# Patient Record
Sex: Male | Born: 1950 | Race: White | Hispanic: No | Marital: Married | Smoking: Former smoker
Health system: Southern US, Community
[De-identification: ages and names within clinical notes are randomized; demographics above are authoritative.]

## PROBLEM LIST (undated history)

## (undated) DIAGNOSIS — Z87442 Personal history of urinary calculi: Secondary | ICD-10-CM

## (undated) DIAGNOSIS — I499 Cardiac arrhythmia, unspecified: Secondary | ICD-10-CM

## (undated) DIAGNOSIS — I255 Ischemic cardiomyopathy: Secondary | ICD-10-CM

## (undated) DIAGNOSIS — F329 Major depressive disorder, single episode, unspecified: Secondary | ICD-10-CM

## (undated) DIAGNOSIS — E785 Hyperlipidemia, unspecified: Secondary | ICD-10-CM

## (undated) DIAGNOSIS — E119 Type 2 diabetes mellitus without complications: Secondary | ICD-10-CM

## (undated) DIAGNOSIS — M5020 Other cervical disc displacement, unspecified cervical region: Secondary | ICD-10-CM

## (undated) DIAGNOSIS — M1990 Unspecified osteoarthritis, unspecified site: Secondary | ICD-10-CM

## (undated) DIAGNOSIS — I2510 Atherosclerotic heart disease of native coronary artery without angina pectoris: Secondary | ICD-10-CM

## (undated) DIAGNOSIS — G2581 Restless legs syndrome: Secondary | ICD-10-CM

## (undated) DIAGNOSIS — I700 Atherosclerosis of aorta: Secondary | ICD-10-CM

## (undated) DIAGNOSIS — I4891 Unspecified atrial fibrillation: Secondary | ICD-10-CM

## (undated) DIAGNOSIS — I214 Non-ST elevation (NSTEMI) myocardial infarction: Secondary | ICD-10-CM

## (undated) DIAGNOSIS — M5126 Other intervertebral disc displacement, lumbar region: Secondary | ICD-10-CM

## (undated) DIAGNOSIS — K7469 Other cirrhosis of liver: Secondary | ICD-10-CM

## (undated) DIAGNOSIS — I313 Pericardial effusion (noninflammatory): Secondary | ICD-10-CM

## (undated) DIAGNOSIS — G8929 Other chronic pain: Secondary | ICD-10-CM

## (undated) DIAGNOSIS — M47817 Spondylosis without myelopathy or radiculopathy, lumbosacral region: Secondary | ICD-10-CM

## (undated) DIAGNOSIS — M47812 Spondylosis without myelopathy or radiculopathy, cervical region: Secondary | ICD-10-CM

## (undated) DIAGNOSIS — R55 Syncope and collapse: Secondary | ICD-10-CM

## (undated) DIAGNOSIS — I3139 Other pericardial effusion (noninflammatory): Secondary | ICD-10-CM

## (undated) DIAGNOSIS — K7581 Nonalcoholic steatohepatitis (NASH): Secondary | ICD-10-CM

## (undated) DIAGNOSIS — G709 Myoneural disorder, unspecified: Secondary | ICD-10-CM

## (undated) DIAGNOSIS — K7460 Unspecified cirrhosis of liver: Secondary | ICD-10-CM

## (undated) DIAGNOSIS — I209 Angina pectoris, unspecified: Secondary | ICD-10-CM

## (undated) DIAGNOSIS — J189 Pneumonia, unspecified organism: Secondary | ICD-10-CM

## (undated) DIAGNOSIS — F32A Depression, unspecified: Secondary | ICD-10-CM

## (undated) DIAGNOSIS — M549 Dorsalgia, unspecified: Secondary | ICD-10-CM

## (undated) HISTORY — PX: CERVICAL FUSION: SHX112

## (undated) HISTORY — DX: Restless legs syndrome: G25.81

## (undated) HISTORY — PX: CARDIAC CATHETERIZATION: SHX172

## (undated) HISTORY — DX: Other intervertebral disc displacement, lumbar region: M51.26

## (undated) HISTORY — DX: Other cirrhosis of liver: K74.69

## (undated) HISTORY — DX: Atherosclerosis of aorta: I70.0

## (undated) HISTORY — PX: CORONARY ANGIOPLASTY: SHX604

## (undated) HISTORY — DX: Nonalcoholic steatohepatitis (NASH): K75.81

## (undated) HISTORY — DX: Other pericardial effusion (noninflammatory): I31.39

## (undated) HISTORY — DX: Hyperlipidemia, unspecified: E78.5

## (undated) HISTORY — DX: Unspecified cirrhosis of liver: K74.60

## (undated) HISTORY — PX: BACK SURGERY: SHX140

## (undated) HISTORY — DX: Ischemic cardiomyopathy: I25.5

## (undated) HISTORY — DX: Unspecified cirrhosis of liver: K75.81

## (undated) HISTORY — PX: APPENDECTOMY: SHX54

## (undated) HISTORY — DX: Pericardial effusion (noninflammatory): I31.3

---
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
## 1998-06-19 HISTORY — PX: ATRIAL FIBRILLATION ABLATION: SHX5732

## 1998-11-29 ENCOUNTER — Emergency Department (HOSPITAL_COMMUNITY): Admission: EM | Admit: 1998-11-29 | Discharge: 1998-11-29 | Payer: Self-pay | Admitting: Emergency Medicine

## 1999-06-20 HISTORY — PX: OTHER SURGICAL HISTORY: SHX169

## 2004-09-21 ENCOUNTER — Inpatient Hospital Stay (HOSPITAL_COMMUNITY): Admission: EM | Admit: 2004-09-21 | Discharge: 2004-09-22 | Payer: Self-pay | Admitting: Emergency Medicine

## 2007-03-28 ENCOUNTER — Ambulatory Visit: Payer: Self-pay | Admitting: Internal Medicine

## 2007-04-01 ENCOUNTER — Ambulatory Visit: Payer: Self-pay | Admitting: Internal Medicine

## 2007-06-20 HISTORY — PX: POSTERIOR CERVICAL FUSION/FORAMINOTOMY: SHX5038

## 2008-01-23 ENCOUNTER — Encounter
Admission: RE | Admit: 2008-01-23 | Discharge: 2008-01-23 | Payer: Self-pay | Admitting: Physical Medicine and Rehabilitation

## 2009-02-15 ENCOUNTER — Encounter: Admission: RE | Admit: 2009-02-15 | Discharge: 2009-02-15 | Payer: Self-pay | Admitting: *Deleted

## 2010-04-12 ENCOUNTER — Ambulatory Visit: Payer: Self-pay | Admitting: Internal Medicine

## 2010-07-07 ENCOUNTER — Ambulatory Visit: Payer: Self-pay | Admitting: Internal Medicine

## 2010-07-20 ENCOUNTER — Ambulatory Visit: Payer: Self-pay | Admitting: Internal Medicine

## 2010-08-18 ENCOUNTER — Ambulatory Visit: Payer: Self-pay | Admitting: Internal Medicine

## 2010-11-04 NOTE — Discharge Summary (Signed)
NAME:  TWAN, HARKIN NO.:  [id number]   MEDICAL RECORD NO.:  [id number]          PATIENT TYPE:  INP   LOCATION:  [place]                         FACILITY:  [facility]   PHYSICIAN:  Quay Burow, M.D.   DATE OF BIRTH:  [birth year]   DATE OF ADMISSION:  09/20/2004  DATE OF DISCHARGE:  09/22/2004                                 DISCHARGE SUMMARY   Mr. Hockett is a 60 year old white male patient, who came to the [place]  secondary to chest pain.  He was seen by Dr. Ezzie Dural, who was on call for  our service.  He states that it had started about 8 hours previous to his  coming to the ER.  It was described as burning and a band across the upper  anterior chest, associated with some burning in his left neck.  He had some  mild worsening if he bent over.  It was unrelated to meals, activity, or  respiration.  He had no associated dyspnea, diaphoresis, or nausea.  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  He does have a history of atrial fibrillation and  has undergone 2 ablation procedures down there.  He has also undergone  cardiac catheterization in the past and with no coronary artery disease.  He  was seen by Dr. Gaetano Net and admitted and put on IV heparin to rule out for  an MI.  He was seen by Dr. Quay Burow, also on September 21, 2004, who  ordered a chest CT to rule out any pulmonary pathologies and a pericardial  effusion.  He also underwent a 2 D echocardiogram on September 22, 2004.  His CT  scan did show a small to moderate pericardial effusion.  He also had  moderate cervical spondylosis with potential for spinal stenosis in C5 to  C6.  His echocardiogram was pending at the time of this discharge.  He was  seen by Dr. Terance Ice on September 22, 2004, considered stable for  discharge home.  He had been put on ibuprofen, and his pain was controlled.  His blood pressure 115/72.  His respirations were 18.  His  pulse was 90.  His temperature was 97.4.   LABORATORY DATA:  September 22, 2004, hemoglobin was 11.6, hematocrit 32.9, WBC  9.5, platelets __________.  Sodium was 138, potassium was 4.0, chloride was  104, CO2 was 26, glucose was 132 on admission.  BUN was 11, creatinine 0.8.  CK-MBs and troponins were negative x 2, and a marker was negative x 1.  Total cholesterol was 190, triglycerides were 229.  His HDL was 36.  His LDL  was 108.  I do not believe they were fasting levels.  CT of the chest showed  small amount of pericardial effusion with bibasilar pulmonary atelectasis.  CT of the neck done for a questionable soft mass, there was no evidence of  left neck mass.  Moderate cervical spondylosis with potential for spinal  stenosis C5 through C6.  His chest x-ray showed no cardiopulmonary process.  DISCHARGE DIAGNOSES:  1.  Chest pain.  CK-MBs negative.  Felt not to be coronary ischemia.  2.  Probable pericarditis.  3.  Pericardial effusion.  4.  Prior history of atrial fibrillation with ablations.  5.  Borderline diabetes mellitus.  6.  Dyslipidemia.   DISCHARGE MEDICATIONS:  1.  Nexium 40 mg 1 tab daily.  2.  Ibuprofen 600 mg q.8h. daily.  3.  Aspirin 81 mg once daily.  4.  Toprol XL 25 mg once daily.  This was given to him for his elevated      heart rate.   He should do no strenuous activity, no concentrated sweets, and decrease  simple carbohydrates.  He will follow up with Dr. Adora Fridge next week in our  office, and they will call him with an appointment.      BB/MEDQ  D:  09/22/2004  T:  09/22/2004  Job:  [id number]

## 2010-11-04 NOTE — H&P (Signed)
NAME:  Kristopher, [name] NO.:  [id number]   MEDICAL RECORD NO.:  [id number]          PATIENT TYPE:  EMS   LOCATION:  ED                           FACILITY[facility]  [facility]   PHYSICIAN:  Broadus John, MD DATE OF BIRTH:  Oct 17, 1950   DATE OF ADMISSION:  09/20/2004  DATE OF DISCHARGE:                                HISTORY & PHYSICAL   Kristopher [name] is a 60 year old white man who is admitted to [facility] for further evaluation of chest pain.   The patient presented to the emergency department with a history of chest  pain which began approximately eight hours ago.  The chest pain is described  as a burning in a band across the upper anterior chest.  It is also  associated with some burning in his left neck.  The discomfort has been  continuous during this period of time.  There were no exacerbating or  ameliorating factors other than perhaps some mild worsening if he bent  forward.  The discomfort appears to be unrelated to meals, activity, or  respirations.  There is no associated dyspnea, diaphoresis, or nausea.  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  The patient has not experienced discomfort in the  past.  He has a history of atrial fibrillation and has undergone two  ablation procedures.  There is no history of myocardial infarction, coronary  artery disease, or congestive heart failure.  The patient has a number of risk factors for coronary artery disease.  He  has dyslipidemia which has not been treated.  He also has borderline  diabetes mellitus.  He does not smoke.  There is no history of hypertension.  There is no family history of early coronary artery disease.  The patient has no other medical problems.   OPERATIONS:  __________  .   MEDICATIONS:  None.   ALLERGIES:  CODEINE.   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  He lives with his  wife.   REVIEW OF SYSTEMS:  Reveals no problems related to head, eyes, ears, nose,  mouth, throat, lungs, gastrointestinal system, genitourinary system, or  extremities.  There is no history of neurologic or psychiatric disorder.  There is no history of fever, chills, or weight loss.   PHYSICAL EXAMINATION:  VITAL SIGNS:  Blood pressure 104/71, pulse 89 and  regular, respirations 20, temperature 98.2.  GENERAL:  The patient was a middle-aged man in no discomfort.  He was alert,  oriented, appropriate, and responsive.  HEENT:  Normal.  NECK:  Without thyromegaly or adenopathy.  Carotid pulses were palpable  bilaterally without bruits.  CARDIAC:  Revealed a normal S1 and S2.  There was no S3, S4, murmur, rub, or  click.  Cardiac rhythm was regular.  No chest wall tenderness was noted.  LUNGS:  Clear.  ABDOMEN:  Soft and nontender.  There was no mass, hepatosplenomegaly, bruit,  distention, rebound, guarding, or rigidity.  Bowel sounds were normal.  RECTAL/GENITAL:  Not performed as they were not pertinent to the reason for  acute care  hospitalization.  EXTREMITIES:  Without edema, deviation, or deformity.  Radial and dorsalis  pedal pulses were palpable bilaterally.  NEUROLOGIC:  Brief screening neurologic survey was unremarkable.   Electrocardiogram revealed a normal sinus rhythm with a nonspecific ST wave  abnormality.  The chest radiograph, according to the radiologist, was  normal.  The initial set of cardiac markers revealed a myoglobin of 60.9, CK-  MB less than 1.0, and troponin less than 0.05.  Second set revealed a  myoglobin of 49.5, CK-MB less than 1.0, and troponin less than 0.05.  Potassium is 3.2 with a BUN of 11 and creatinine of 1.1.  White count was  9.5 with a hemoglobin of 13.1 and hematocrit of 36.7.  The remaining studies  were pending at the time of this dictation.   IMPRESSION:  1.  Atypical chest pain, rule out coronary artery disease.  2.  History of atrial  fibrillation, status post two ablations.  3.  Borderline diabetes mellitus.  4.  Dyslipidemia.  5.  __________  .   PLAN:  1.  Telemetry.  2.  Serial cardiac enzymes.  3.  Aspirin.  4.  Intravenous heparin.  5.  Intravenous nitroglycerin.  6.  Fasting lipid profile and fasting blood sugar.  7.  Evaluate anemia.  8.  Further measures per Dr. Mitzi Davenport.      MSC/MEDQ  D:  09/21/2004  T:  09/21/2004  Job:  [id number]   cc:   Marney Setting. Mitzi Davenport, M.D.  Fax: [phone]

## 2011-02-16 ENCOUNTER — Other Ambulatory Visit: Payer: Self-pay | Admitting: Internal Medicine

## 2011-06-27 ENCOUNTER — Emergency Department: Payer: Self-pay | Admitting: Emergency Medicine

## 2011-07-05 ENCOUNTER — Ambulatory Visit: Payer: Self-pay | Admitting: Anesthesiology

## 2011-07-26 ENCOUNTER — Encounter: Payer: Self-pay | Admitting: General Practice

## 2011-08-18 ENCOUNTER — Encounter: Payer: Self-pay | Admitting: General Practice

## 2012-03-11 ENCOUNTER — Ambulatory Visit: Payer: Self-pay | Admitting: General Practice

## 2013-04-16 ENCOUNTER — Ambulatory Visit: Payer: Self-pay | Admitting: Neurology

## 2013-06-26 ENCOUNTER — Encounter (HOSPITAL_COMMUNITY): Payer: Self-pay | Admitting: Emergency Medicine

## 2013-06-26 ENCOUNTER — Emergency Department (HOSPITAL_COMMUNITY): Payer: 59

## 2013-06-26 ENCOUNTER — Inpatient Hospital Stay (HOSPITAL_COMMUNITY)
Admission: EM | Admit: 2013-06-26 | Discharge: 2013-06-28 | DRG: 247 | Disposition: A | Discharge status: Home / Self Care | Payer: 59 | Attending: Internal Medicine | Admitting: Internal Medicine

## 2013-06-26 DIAGNOSIS — Z7982 Long term (current) use of aspirin: Secondary | ICD-10-CM

## 2013-06-26 DIAGNOSIS — I318 Other specified diseases of pericardium: Secondary | ICD-10-CM | POA: Diagnosis present

## 2013-06-26 DIAGNOSIS — E119 Type 2 diabetes mellitus without complications: Secondary | ICD-10-CM | POA: Diagnosis present

## 2013-06-26 DIAGNOSIS — I214 Non-ST elevation (NSTEMI) myocardial infarction: Principal | ICD-10-CM | POA: Diagnosis present

## 2013-06-26 DIAGNOSIS — I3139 Other pericardial effusion (noninflammatory): Secondary | ICD-10-CM

## 2013-06-26 DIAGNOSIS — M549 Dorsalgia, unspecified: Secondary | ICD-10-CM | POA: Diagnosis present

## 2013-06-26 DIAGNOSIS — G8929 Other chronic pain: Secondary | ICD-10-CM | POA: Diagnosis present

## 2013-06-26 DIAGNOSIS — Z981 Arthrodesis status: Secondary | ICD-10-CM

## 2013-06-26 DIAGNOSIS — I319 Disease of pericardium, unspecified: Secondary | ICD-10-CM

## 2013-06-26 DIAGNOSIS — I313 Pericardial effusion (noninflammatory): Secondary | ICD-10-CM

## 2013-06-26 DIAGNOSIS — E785 Hyperlipidemia, unspecified: Secondary | ICD-10-CM | POA: Diagnosis present

## 2013-06-26 DIAGNOSIS — E876 Hypokalemia: Secondary | ICD-10-CM | POA: Diagnosis not present

## 2013-06-26 HISTORY — DX: Non-ST elevation (NSTEMI) myocardial infarction: I21.4

## 2013-06-26 HISTORY — DX: Other cervical disc displacement, unspecified cervical region: M50.20

## 2013-06-26 HISTORY — DX: Unspecified atrial fibrillation: I48.91

## 2013-06-26 HISTORY — DX: Atherosclerotic heart disease of native coronary artery without angina pectoris: I25.10

## 2013-06-26 LAB — POCT I-STAT TROPONIN I: Troponin i, poc: 0.06 ng/mL (ref 0.00–0.08)

## 2013-06-26 LAB — CBC
HEMATOCRIT: 39 % (ref 39.0–52.0)
Hemoglobin: 13.4 g/dL (ref 13.0–17.0)
MCH: 33.6 pg (ref 26.0–34.0)
MCHC: 34.4 g/dL (ref 30.0–36.0)
MCV: 97.7 fL (ref 78.0–100.0)
PLATELETS: 190 10*3/uL (ref 150–400)
RBC: 3.99 MIL/uL — ABNORMAL LOW (ref 4.22–5.81)
RDW: 13.6 % (ref 11.5–15.5)
WBC: 8.3 10*3/uL (ref 4.0–10.5)

## 2013-06-26 LAB — BASIC METABOLIC PANEL
BUN: 17 mg/dL (ref 6–23)
CHLORIDE: 102 meq/L (ref 96–112)
CO2: 28 mEq/L (ref 19–32)
Calcium: 9.4 mg/dL (ref 8.4–10.5)
Creatinine, Ser: 1.02 mg/dL (ref 0.50–1.35)
GFR calc non Af Amer: 78 mL/min — ABNORMAL LOW (ref >90)
GLUCOSE: 145 mg/dL — AB (ref 70–99)
POTASSIUM: 3.6 meq/L — AB (ref 3.7–5.3)
Sodium: 142 mEq/L (ref 137–147)

## 2013-06-26 LAB — GLUCOSE, CAPILLARY: GLUCOSE-CAPILLARY: 135 mg/dL — AB (ref 70–99)

## 2013-06-26 MED ORDER — ONDANSETRON HCL 4 MG/2ML IJ SOLN
4.0000 mg | Freq: Once | INTRAMUSCULAR | Status: AC
  Administered 2013-06-26: 4 mg via INTRAVENOUS
  Filled 2013-06-26: qty 2

## 2013-06-26 MED ORDER — ONDANSETRON 8 MG PO TBDP: 8.0000 mg | ORAL_TABLET | Freq: Once | ORAL | Status: DC

## 2013-06-26 MED ORDER — IOHEXOL 350 MG/ML SOLN
100.0000 mL | Freq: Once | INTRAVENOUS | Status: AC | PRN
  Administered 2013-06-26: 100 mL via INTRAVENOUS

## 2013-06-26 MED ORDER — FENTANYL CITRATE 0.05 MG/ML IJ SOLN: 50.0000 ug | Freq: Once | INTRAMUSCULAR | Status: DC

## 2013-06-26 MED ORDER — HYDROMORPHONE HCL PF 1 MG/ML IJ SOLN
0.5000 mg | Freq: Once | INTRAMUSCULAR | Status: AC
  Administered 2013-06-26: 0.5 mg via INTRAVENOUS
  Filled 2013-06-26: qty 1

## 2013-06-26 MED ORDER — MORPHINE SULFATE 4 MG/ML IJ SOLN
4.0000 mg | Freq: Once | INTRAMUSCULAR | Status: AC
Start: 2013-06-26 — End: 2013-06-26
  Administered 2013-06-26: 4 mg via INTRAVENOUS
  Filled 2013-06-26: qty 1

## 2013-06-26 NOTE — ED Provider Notes (Signed)
CSN: [id number]     Arrival date & time 06/26/13  2159 History  This chart was scribed for non-physician practitioner, Barton Dubois, PA-C,working with Blanchie Dessert, MD, by Marlowe Kays, ED Scribe.  This patient was seen in room WA11/WA11 and the patient's care was started at 10:15 PM.  Chief Complaint  Patient presents with  . Chest Pain   The history is provided by the patient. No language interpreter was used.   HPI Comments:  Kristopher [name] is a 63 y.o. male, with h/o a-fib and ablation, who presents to the Emergency Department complaining of centralized chest pain that radiates to bilateral upper extremities and his back onset 45 minutes ago. Pt states the pain feels like pressure and states the pain is 9/10. He states he was sitting down watching television when the pain came on suddenly. Pt reports associated diaphoresis, light headedness, SOB, and nausea. He states he did not feel well yesterday and presented to an urgent care with a negative flu test. He denies fever.    Past Medical History  Diagnosis Date  . Diabetes mellitus without complication   . Herniated cervical disc     c2-T1 fusion  . Chronic pain     back pain   Past Surgical History  Procedure Laterality Date  . Back surgery      fusion c3-t1   History reviewed. No pertinent family history. History  Substance Use Topics  . Smoking status: Never Smoker   . Smokeless tobacco: Never Used  . Alcohol Use: Yes     Comment: rarely    Review of Systems  Constitutional: Positive for diaphoresis. Negative for fever.  Respiratory: Positive for shortness of breath.   Cardiovascular: Positive for chest pain.  Gastrointestinal: Positive for nausea.  Neurological: Positive for light-headedness.  All other systems reviewed and are negative.    Allergies  Codeine  Home Medications  No current outpatient prescriptions on file. Triage Vitals: BP 141/97  Pulse 101  Temp(Src) 97.7 F (36.5 C) (Oral)  Resp  17  Ht 5' 7"  (1.702 m)  Wt 180 lb (81.647 kg)  BMI 28.19 kg/m2  SpO2 100% Physical Exam  Nursing note and vitals reviewed. Constitutional: He is oriented to person, place, and time. He appears well-developed and well-nourished.  Pt arching his back and appears very uncomfortable  HENT:  Head: Normocephalic and atraumatic.  Eyes:  Normal appearance  Neck: Normal range of motion.  Cardiovascular: Normal rate, regular rhythm and intact distal pulses.   Pulmonary/Chest: Effort normal and breath sounds normal. No respiratory distress. He exhibits no tenderness.  No pleuritic pain reported  Abdominal: Soft. Bowel sounds are normal. He exhibits no distension. There is no tenderness. There is no guarding.  Musculoskeletal: Normal range of motion.  No peripheral edema or calf tenderness  Neurological: He is alert and oriented to person, place, and time.  Skin: Skin is warm. No rash noted.  diaphoretic  Psychiatric: His behavior is normal.  Mildly anxious appearing    ED Course  Procedures (including critical care time) DIAGNOSTIC STUDIES: Oxygen Saturation is 100% on RA, normal by my interpretation.   COORDINATION OF CARE: 10:20 PM- Will get a CXR and a CT scan. Pt verbalizes understanding and agrees to plan.  Medications  morphine 4 MG/ML injection 4 mg (not administered)  ondansetron (ZOFRAN-ODT) disintegrating tablet 8 mg (not administered)    Labs Review Labs Reviewed  CBC - Abnormal; Notable for the following:    RBC 3.99 (*)  All other components within normal limits  BASIC METABOLIC PANEL - Abnormal; Notable for the following:    Potassium 3.6 (*)    Glucose, Bld 145 (*)    GFR calc non Af Amer 78 (*)    All other components within normal limits  GLUCOSE, CAPILLARY - Abnormal; Notable for the following:    Glucose-Capillary 135 (*)    All other components within normal limits  APTT  PROTIME-INR  POCT I-STAT TROPONIN I   Imaging Review Ct Angio Chest Pe W/cm  &/or Wo Cm  06/26/2013   CLINICAL DATA:  Chest pain  EXAM: CT CHEST WITHOUT CONTRAST; CT ANGIOGRAPHY CHEST WITH CONTRAST  TECHNIQUE: Initially, multidetector CT images were obtained through the chest without intravenous contrast material administration. Subsequently, multidetector CT imaging of the chest was performed using the standard protocol during bolus administration of intravenous contrast. Multiplanar CT image reconstructions including MIPs were obtained to evaluate the vascular anatomy.  CONTRAST:  15m OMNIPAQUE IOHEXOL 350 MG/ML SOLN  COMPARISON:  Chest radiograph June 26, 2013 and chest CT September 21, 2004  FINDINGS: There is no demonstrable pulmonary embolus. There is no thoracic aortic aneurysm or dissection.  There is a moderate pericardial effusion.  There is subsegmental atelectasis in the lung bases. The lungs are otherwise clear. There is no appreciable thoracic adenopathy.  In the visualized upper abdomen, there is fatty liver. There is postoperative change in the lower cervical spine. There are no blastic or lytic bone lesions. Thyroid appears normal.  Review of the MIP images confirms the above findings.  IMPRESSION: Moderate pericardial effusion. No edema or consolidation. No demonstrable pulmonary embolus. No thoracic aortic aneurysm or dissection.   Electronically Signed   By: WLowella GripM.D.   On: 06/26/2013 23:54   Dg Chest Port 1 View  06/26/2013   CLINICAL DATA:  Chest pain  EXAM: PORTABLE CHEST - 1 VIEW  COMPARISON:  August 20, 2004  FINDINGS: Lungs are clear. Heart is upper normal in size with normal pulmonary vascularity. No adenopathy. No pneumothorax. There is postoperative change in the visualized lower cervical spine.  IMPRESSION: No edema or consolidation.   Electronically Signed   By: WLowella GripM.D.   On: 06/26/2013 22:52    EKG Interpretation    Date/Time:  Thursday June 26 2013 22:30:44 EST Ventricular Rate:  105 PR Interval:  160 QRS  Duration: 100 QT Interval:  353 QTC Calculation: 466 R Axis:   86 Text Interpretation:  Sinus tachycardia Ventricular trigeminy Borderline right axis deviation No significant change since last tracing Confirmed by PMaryan Rued MD, WHITNEY ([id number] on 06/26/2013 10:34:40 PM            MDM   1. NSTEMI (non-ST elevated myocardial infarction)    89yoM w/ h/o pericarditis w/ chronic pericardial effusion, afib (resolved following ablation 15 years ago) and diabetes, presents w/ acute onset severe pressure center of chest w/ radiation through to back and down bilateral upper extremities and associated w/ SOB, diaphoresis and nausea at rest at 9:45pm today.  On initial exam, pt appears very uncomfortable, mildly hypertensive and tachycardic, diaphoretic, no respiratory distress, breath sounds nml, CP is not reproducible, abd benign, no LE edema/ttp.  Serial EKGs non-ischemic.  Initial troponin drawn 47m following onset of symptoms is negative.  Delta pending.  Labs otherwise unremarkable and CTA Chest neg for PE and dissection.    Delta troponin positive.  Cardiology consulted for admission for likely NSTEMI and pt to be transferred  to [facility].  Pain was 2/10 following 2 SL ntg.  Nitro paste administered and pain resolved.  Pt otherwise stable.     I personally performed the services described in this documentation, which was scribed in my presence. The recorded information has been reviewed and is accurate.    Remer Macho, PA-C 06/27/13 [phone]

## 2013-06-26 NOTE — ED Notes (Signed)
EKG repeat given to Maryan Rued, MD for review.

## 2013-06-26 NOTE — ED Notes (Signed)
Pt reports seen at PCP today for flu-like symptoms with a cough and nasal congestion, flu PCR was negative, dx with sinusitis and told to take Phenylephrine at home for symptoms, also has been taking Ibuprofen for fever

## 2013-06-26 NOTE — ED Notes (Signed)
Pt reports that he began having centralized CP at 2145, radiating to both arms, took 339m Aspirin at home, reports ShOB, lightheadedness and is diaphoretic and pale. Pt ambulatory to triage, a&o x4.  Hx a. Fib with ablation in 1999.

## 2013-06-27 ENCOUNTER — Encounter (HOSPITAL_COMMUNITY)
Admission: EM | Disposition: A | Discharge status: Home / Self Care | Payer: 59 | Source: Home / Self Care | Attending: Internal Medicine

## 2013-06-27 DIAGNOSIS — E785 Hyperlipidemia, unspecified: Secondary | ICD-10-CM

## 2013-06-27 DIAGNOSIS — I214 Non-ST elevation (NSTEMI) myocardial infarction: Secondary | ICD-10-CM

## 2013-06-27 DIAGNOSIS — I318 Other specified diseases of pericardium: Secondary | ICD-10-CM

## 2013-06-27 DIAGNOSIS — I319 Disease of pericardium, unspecified: Secondary | ICD-10-CM

## 2013-06-27 DIAGNOSIS — I2510 Atherosclerotic heart disease of native coronary artery without angina pectoris: Secondary | ICD-10-CM

## 2013-06-27 DIAGNOSIS — R079 Chest pain, unspecified: Secondary | ICD-10-CM

## 2013-06-27 HISTORY — DX: Non-ST elevation (NSTEMI) myocardial infarction: I21.4

## 2013-06-27 HISTORY — PX: LEFT HEART CATHETERIZATION WITH CORONARY ANGIOGRAM: SHX5451

## 2013-06-27 HISTORY — PX: PERCUTANEOUS CORONARY STENT INTERVENTION (PCI-S): SHX5485

## 2013-06-27 LAB — BASIC METABOLIC PANEL
BUN: 14 mg/dL (ref 6–23)
CHLORIDE: 105 meq/L (ref 96–112)
CO2: 24 mEq/L (ref 19–32)
Calcium: 8.2 mg/dL — ABNORMAL LOW (ref 8.4–10.5)
Creatinine, Ser: 0.78 mg/dL (ref 0.50–1.35)
GFR calc Af Amer: >90 mL/min (ref >90)
GFR calc non Af Amer: >90 mL/min (ref >90)
GLUCOSE: 193 mg/dL — AB (ref 70–99)
POTASSIUM: 3.9 meq/L (ref 3.7–5.3)
Sodium: 142 mEq/L (ref 137–147)

## 2013-06-27 LAB — GLUCOSE, CAPILLARY
GLUCOSE-CAPILLARY: 166 mg/dL — AB (ref 70–99)
Glucose-Capillary: 179 mg/dL — ABNORMAL HIGH (ref 70–99)
Glucose-Capillary: 161 mg/dL — ABNORMAL HIGH (ref 70–99)
Glucose-Capillary: 136 mg/dL — ABNORMAL HIGH (ref 70–99)
Glucose-Capillary: 167 mg/dL — ABNORMAL HIGH (ref 70–99)

## 2013-06-27 LAB — CBC WITH DIFFERENTIAL/PLATELET
Basophils Absolute: 0 10*3/uL (ref 0.0–0.1)
Basophils Relative: 0 % (ref 0–1)
Eosinophils Absolute: 0.1 10*3/uL (ref 0.0–0.7)
Eosinophils Relative: 1 % (ref 0–5)
HCT: 33 % — ABNORMAL LOW (ref 39.0–52.0)
Hemoglobin: 11.3 g/dL — ABNORMAL LOW (ref 13.0–17.0)
LYMPHS ABS: 0.9 10*3/uL (ref 0.7–4.0)
LYMPHS PCT: 13 % (ref 12–46)
MCH: 33.8 pg (ref 26.0–34.0)
MCHC: 34.2 g/dL (ref 30.0–36.0)
MCV: 98.8 fL (ref 78.0–100.0)
Monocytes Absolute: 0.7 10*3/uL (ref 0.1–1.0)
Monocytes Relative: 9 % (ref 3–12)
NEUTROS ABS: 5.5 10*3/uL (ref 1.7–7.7)
NEUTROS PCT: 77 % (ref 43–77)
PLATELETS: 165 10*3/uL (ref 150–400)
RBC: 3.34 MIL/uL — AB (ref 4.22–5.81)
RDW: 14.1 % (ref 11.5–15.5)
WBC: 7.1 10*3/uL (ref 4.0–10.5)

## 2013-06-27 LAB — POCT I-STAT TROPONIN I: Troponin i, poc: 0.51 ng/mL (ref 0.00–0.08)

## 2013-06-27 LAB — LIPID PANEL
Cholesterol: 175 mg/dL (ref 0–200)
HDL: 31 mg/dL — ABNORMAL LOW (ref >39)
LDL CALC: 119 mg/dL — AB (ref 0–99)
TRIGLYCERIDES: 126 mg/dL (ref <150)
Total CHOL/HDL Ratio: 5.6 RATIO
VLDL: 25 mg/dL (ref 0–40)

## 2013-06-27 LAB — PROTIME-INR
INR: 0.98 (ref 0.00–1.49)
Prothrombin Time: 12.8 seconds (ref 11.6–15.2)

## 2013-06-27 LAB — MRSA PCR SCREENING: MRSA by PCR: NEGATIVE

## 2013-06-27 LAB — POCT ACTIVATED CLOTTING TIME: ACTIVATED CLOTTING TIME: 337 s

## 2013-06-27 LAB — TSH: TSH: 2.927 u[IU]/mL (ref 0.350–4.500)

## 2013-06-27 LAB — TROPONIN I
TROPONIN I: 1.83 ng/mL — AB (ref <0.30)
Troponin I: 7.54 ng/mL (ref <0.30)

## 2013-06-27 LAB — APTT: aPTT: 30 seconds (ref 24–37)

## 2013-06-27 SURGERY — LEFT HEART CATHETERIZATION WITH CORONARY ANGIOGRAM
Anesthesia: LOCAL

## 2013-06-27 MED ORDER — TICAGRELOR 90 MG PO TABS
90.0000 mg | ORAL_TABLET | Freq: Two times a day (BID) | ORAL | Status: DC
  Administered 2013-06-27 – 2013-06-28 (×2): 90 mg via ORAL
  Filled 2013-06-27 (×3): qty 1

## 2013-06-27 MED ORDER — HEPARIN BOLUS VIA INFUSION
4000.0000 [IU] | Freq: Once | INTRAVENOUS | Status: DC
  Filled 2013-06-27: qty 4000

## 2013-06-27 MED ORDER — NITROGLYCERIN 0.2 MG/ML ON CALL CATH LAB
INTRAVENOUS | Status: AC
  Filled 2013-06-27: qty 1

## 2013-06-27 MED ORDER — METOPROLOL TARTRATE 12.5 MG HALF TABLET
12.5000 mg | ORAL_TABLET | Freq: Two times a day (BID) | ORAL | Status: DC
  Administered 2013-06-27 – 2013-06-28 (×3): 12.5 mg via ORAL
  Filled 2013-06-27 (×4): qty 1

## 2013-06-27 MED ORDER — SODIUM CHLORIDE 0.9 % IV SOLN
INTRAVENOUS | Status: DC
  Administered 2013-06-27: 12:00:00 via INTRAVENOUS

## 2013-06-27 MED ORDER — SODIUM CHLORIDE 0.9 % IV SOLN: 250.0000 mL | INTRAVENOUS | Status: DC | PRN

## 2013-06-27 MED ORDER — ONDANSETRON HCL 4 MG/2ML IJ SOLN
4.0000 mg | Freq: Four times a day (QID) | INTRAMUSCULAR | Status: DC | PRN
  Administered 2013-06-27: 4 mg via INTRAVENOUS

## 2013-06-27 MED ORDER — NITROGLYCERIN 0.4 MG SL SUBL
0.4000 mg | SUBLINGUAL_TABLET | SUBLINGUAL | Status: AC
  Administered 2013-06-27 (×3): 0.4 mg via SUBLINGUAL

## 2013-06-27 MED ORDER — ASPIRIN 325 MG PO TABS
325.0000 mg | ORAL_TABLET | Freq: Once | ORAL | Status: AC
  Administered 2013-06-27: 325 mg via ORAL
  Filled 2013-06-27: qty 1

## 2013-06-27 MED ORDER — ACETAMINOPHEN 325 MG PO TABS: 650.0000 mg | ORAL_TABLET | ORAL | Status: DC | PRN

## 2013-06-27 MED ORDER — TICAGRELOR 90 MG PO TABS
ORAL_TABLET | ORAL | Status: AC
  Filled 2013-06-27: qty 1

## 2013-06-27 MED ORDER — ASPIRIN EC 81 MG PO TBEC
81.0000 mg | DELAYED_RELEASE_TABLET | Freq: Every day | ORAL | Status: DC
  Administered 2013-06-28: 81 mg via ORAL
  Filled 2013-06-27: qty 1

## 2013-06-27 MED ORDER — SODIUM CHLORIDE 0.9 % IJ SOLN: 3.0000 mL | Freq: Two times a day (BID) | INTRAMUSCULAR | Status: DC

## 2013-06-27 MED ORDER — PERFLUTREN LIPID MICROSPHERE
INTRAVENOUS | Status: AC
  Filled 2013-06-27: qty 10

## 2013-06-27 MED ORDER — HEPARIN (PORCINE) IN NACL 100-0.45 UNIT/ML-% IJ SOLN
1000.0000 [IU]/h | INTRAMUSCULAR | Status: DC
Start: 2013-06-27 — End: 2013-06-27
  Filled 2013-06-27: qty 250

## 2013-06-27 MED ORDER — SODIUM CHLORIDE 0.9 % IV SOLN
0.2500 mg/kg/h | INTRAVENOUS | Status: AC
  Filled 2013-06-27: qty 250

## 2013-06-27 MED ORDER — GABAPENTIN 300 MG PO CAPS
600.0000 mg | ORAL_CAPSULE | Freq: Two times a day (BID) | ORAL | Status: DC
  Administered 2013-06-27 – 2013-06-28 (×4): 600 mg via ORAL
  Filled 2013-06-27 (×5): qty 2

## 2013-06-27 MED ORDER — METOCLOPRAMIDE HCL 5 MG/ML IJ SOLN
5.0000 mg | Freq: Four times a day (QID) | INTRAMUSCULAR | Status: DC
  Administered 2013-06-27 (×2): 5 mg via INTRAVENOUS
  Administered 2013-06-27: 08:00:00 via INTRAVENOUS
  Administered 2013-06-27 – 2013-06-28 (×2): 5 mg via INTRAVENOUS
  Filled 2013-06-27 (×10): qty 1

## 2013-06-27 MED ORDER — INSULIN ASPART 100 UNIT/ML ~~LOC~~ SOLN
0.0000 [IU] | Freq: Three times a day (TID) | SUBCUTANEOUS | Status: DC
  Administered 2013-06-28: 2 [IU] via SUBCUTANEOUS

## 2013-06-27 MED ORDER — HEPARIN (PORCINE) IN NACL 100-0.45 UNIT/ML-% IJ SOLN
1000.0000 [IU]/h | INTRAMUSCULAR | Status: DC
  Administered 2013-06-27: 1000 [IU]/h via INTRAVENOUS
  Filled 2013-06-27: qty 250

## 2013-06-27 MED ORDER — POTASSIUM CHLORIDE ER 10 MEQ PO TBCR
40.0000 meq | EXTENDED_RELEASE_TABLET | Freq: Once | ORAL | Status: AC
  Administered 2013-06-27: 40 meq via ORAL
  Filled 2013-06-27 (×2): qty 4

## 2013-06-27 MED ORDER — MIDAZOLAM HCL 2 MG/2ML IJ SOLN
INTRAMUSCULAR | Status: AC
  Filled 2013-06-27: qty 2

## 2013-06-27 MED ORDER — BUPROPION HCL ER (XL) 300 MG PO TB24
300.0000 mg | ORAL_TABLET | Freq: Every day | ORAL | Status: DC
  Administered 2013-06-28: 300 mg via ORAL
  Filled 2013-06-27 (×2): qty 1

## 2013-06-27 MED ORDER — ATORVASTATIN CALCIUM 80 MG PO TABS
80.0000 mg | ORAL_TABLET | Freq: Every day | ORAL | Status: DC
  Administered 2013-06-27: 80 mg via ORAL
  Filled 2013-06-27 (×2): qty 1

## 2013-06-27 MED ORDER — SODIUM CHLORIDE 0.9 % IV SOLN: INTRAVENOUS | Status: DC

## 2013-06-27 MED ORDER — CYCLOBENZAPRINE HCL 10 MG PO TABS: 10.0000 mg | ORAL_TABLET | Freq: Three times a day (TID) | ORAL | Status: DC | PRN

## 2013-06-27 MED ORDER — GABAPENTIN 300 MG PO CAPS
900.0000 mg | ORAL_CAPSULE | Freq: Every day | ORAL | Status: DC
  Administered 2013-06-27: 900 mg via ORAL
  Filled 2013-06-27 (×2): qty 3

## 2013-06-27 MED ORDER — COLCHICINE 0.6 MG PO TABS
0.6000 mg | ORAL_TABLET | Freq: Two times a day (BID) | ORAL | Status: DC
  Administered 2013-06-28: 0.6 mg via ORAL
  Filled 2013-06-27 (×2): qty 1

## 2013-06-27 MED ORDER — ASPIRIN EC 81 MG PO TBEC: 81.0000 mg | DELAYED_RELEASE_TABLET | Freq: Every day | ORAL | Status: DC

## 2013-06-27 MED ORDER — ONDANSETRON HCL 4 MG/2ML IJ SOLN
INTRAMUSCULAR | Status: AC
  Filled 2013-06-27: qty 2

## 2013-06-27 MED ORDER — BACLOFEN 10 MG PO TABS
10.0000 mg | ORAL_TABLET | Freq: Three times a day (TID) | ORAL | Status: DC
  Administered 2013-06-27 – 2013-06-28 (×3): 10 mg via ORAL
  Filled 2013-06-27 (×7): qty 1

## 2013-06-27 MED ORDER — NITROGLYCERIN 2 % TD OINT
1.0000 [in_us] | TOPICAL_OINTMENT | Freq: Once | TRANSDERMAL | Status: AC
  Administered 2013-06-27: 1 [in_us] via TOPICAL
  Filled 2013-06-27: qty 30

## 2013-06-27 MED ORDER — GABAPENTIN 300 MG PO CAPS: 600.0000 mg | ORAL_CAPSULE | Freq: Three times a day (TID) | ORAL | Status: DC

## 2013-06-27 MED ORDER — BIVALIRUDIN 250 MG IV SOLR
INTRAVENOUS | Status: AC
  Filled 2013-06-27: qty 250

## 2013-06-27 MED ORDER — HEPARIN BOLUS VIA INFUSION
4000.0000 [IU] | Freq: Once | INTRAVENOUS | Status: AC
  Administered 2013-06-27: 4000 [IU] via INTRAVENOUS
  Filled 2013-06-27: qty 4000

## 2013-06-27 MED ORDER — SODIUM CHLORIDE 0.9 % IJ SOLN: 3.0000 mL | INTRAMUSCULAR | Status: DC | PRN

## 2013-06-27 MED ORDER — LIDOCAINE HCL (PF) 1 % IJ SOLN
INTRAMUSCULAR | Status: AC
  Filled 2013-06-27: qty 30

## 2013-06-27 MED ORDER — NITROGLYCERIN IN D5W 200-5 MCG/ML-% IV SOLN
3.0000 ug/min | INTRAVENOUS | Status: DC
  Administered 2013-06-27: 5 ug/min via INTRAVENOUS
  Filled 2013-06-27: qty 250

## 2013-06-27 MED ORDER — HEPARIN (PORCINE) IN NACL 2-0.9 UNIT/ML-% IJ SOLN
INTRAMUSCULAR | Status: AC
  Filled 2013-06-27: qty 1000

## 2013-06-27 MED ORDER — METOCLOPRAMIDE HCL 5 MG/ML IJ SOLN
10.0000 mg | Freq: Once | INTRAMUSCULAR | Status: AC
  Administered 2013-06-27: 10 mg via INTRAVENOUS
  Filled 2013-06-27: qty 2

## 2013-06-27 MED ORDER — PERFLUTREN LIPID MICROSPHERE
1.0000 mL | INTRAVENOUS | Status: AC | PRN
  Administered 2013-06-27: 2 mL via INTRAVENOUS

## 2013-06-27 NOTE — Progress Notes (Signed)
PROGRESS NOTE  Subjective:   Kristopher [name] is a 63 yo with hx of DM2, HTN, A-fib ( s/p ablation) who was admitted from [facility] with CP.  CTA was negative for PE or aortic aneurysm or dissection.  He was   found to have a moderate pericardial effusion on the CT .  He has no signs or symptoms or pericardial tamponade.  He has a hx of pericarditis for the past 9 years with occasional flare-ups.     No hx of CAD. Hx of cardiac cath - .  Cholesterol level "around 200"  Objective:    Vital Signs:   Temp:  [97.4 F (36.3 C)-98.6 F (37 C)] 97.4 F (36.3 C) (01/09 0730) Pulse Rate:  [72-107] 105 (01/09 0730) Resp:  [12-23] 23 (01/09 0730) BP: (90-141)/(57-97) 112/72 mmHg (01/09 0730) SpO2:  [92 %-100 %] 100 % (01/09 0730) Weight:  [180 lb (81.647 kg)-188 lb 4.4 oz (85.4 kg)] 188 lb 4.4 oz (85.4 kg) (01/09 0447)  Last BM Date: 06/26/13 `    24-hour weight change: Weight change:   Weight trends: [facility] Weights   06/26/13 2204 06/27/13 0447  Weight: 180 lb (81.647 kg) 188 lb 4.4 oz (85.4 kg)    Intake/Output:  01/08 0701 - 01/09 0700 In: 240.1 [P.O.:60; I.V.:180.1] Out: -      Physical Exam: BP 112/72  Pulse 105  Temp(Src) 97.4 F (36.3 C) (Oral)  Resp 23  Ht 5' 7"  (1.702 m)  Wt 188 lb 4.4 oz (85.4 kg)  BMI 29.48 kg/m2  SpO2 100%  General: Vital signs reviewed and noted.   Head: Normocephalic, atraumatic.  Eyes: conjunctivae/corneas clear.  EOM's intact.   Throat: normal  Neck:  normal, no JVD  Lungs:  few right basilar rales  Heart:  RR, frequent PVCs  Abdomen:  Soft, non-tender, non-distended    Extremities: Good right radial and ulnar pulses   Neurologic: A&O X3, CN II - XII are grossly intact.  Psych: Normal    Labs: BMET:  Recent Labs  06/26/13 2226 06/27/13 0618  NA 142 142  K 3.6* 3.9  CL 102 105  CO2 28 24  GLUCOSE 145* 193*  BUN 17 14  CREATININE 1.02 0.78  CALCIUM 9.4 8.2*    Liver function tests: No results found  for this basename: AST, ALT, ALKPHOS, BILITOT, PROT, ALBUMIN,  in the last 72 hours No results found for this basename: LIPASE, AMYLASE,  in the last 72 hours  CBC:  Recent Labs  06/26/13 2226 06/27/13 0618  WBC 8.3 7.1  NEUTROABS  --  5.5  HGB 13.4 11.3*  HCT 39.0 33.0*  MCV 97.7 98.8  PLT 190 165    Cardiac Enzymes:  Recent Labs  06/27/13 0210 06/27/13 0618  TROPONINI 1.83* 7.54*    Coagulation Studies:  Recent Labs  06/27/13 0210  LABPROT 12.8  INR 0.98    O  Other results:  EKG NSR, frequent PVCs, tiny inferior Q waves.  Medications:    Infusions: . sodium chloride 75 mL/hr at 06/27/13 0530  . heparin 1,000 Units/hr (06/27/13 0447)  . nitroGLYCERIN 5 mcg/min (06/27/13 0539)    Scheduled Medications: . [START ON 06/28/2013] aspirin EC  81 mg Oral Daily  . aspirin  325 mg Oral Once  . atorvastatin  80 mg Oral q1800  . baclofen  10 mg Oral TID  . buPROPion  300 mg Oral Daily  . gabapentin  600 mg Oral BID  WC  . gabapentin  900 mg Oral QHS  . insulin aspart  0-15 Units Subcutaneous TID WC  . metoCLOPramide (REGLAN) injection  5 mg Intravenous Q6H  . metoprolol tartrate  12.5 mg Oral BID  . sodium chloride  3 mL Intravenous Q12H    Assessment/ Plan:    1. CAD:  Possible an inferior MI.  No old ECG.  He now presents with symptoms c/w ACS.  Troponin levels are c/w MI.  He has small (but present) Q waves  on ECG. For cath later this am.  Discussed risks, benefits, options.  He understands and agrees to proceed.   2. Hyperlipidemia:  By his hx, chol levels have been elevated.  Started atorvastatin 80.  Will add lipid levels to the blood this am if possible.   3. Chronic pericarditis:  Assumed to be viral.  Has never tried colchicine.  Will start colchicine tomorrow.     Disposition:  Length of Stay: 1  Thayer Headings, Brooke Bonito., MD, [facility] 06/27/2013, 7:52 AM Office [phone] Pager [phone]

## 2013-06-27 NOTE — Progress Notes (Signed)
ANTICOAGULATION CONSULT NOTE - Initial Consult  Pharmacy Consult for Heparin Indication: chest pain/ACS  Allergies  Allergen Reactions  . Codeine Nausea And Vomiting  . Other Nausea And Vomiting    The patient states that he has had  a reaction to all narcotics. He can take them though.    Patient Measurements: Height: 5' 7"  (170.2 cm) Weight: 180 lb (81.647 kg) IBW/kg (Calculated) : 66.1 Heparin wt= 81 kg  Vital Signs: Temp: 98.6 F (37 C) (01/08 2223) Temp src: Oral (01/08 2223) BP: 132/85 mmHg (01/08 2223) Pulse Rate: 105 (01/08 2223)  Labs:  Recent Labs  06/26/13 2226  HGB 13.4  HCT 39.0  PLT 190  CREATININE 1.02    Estimated Creatinine Clearance: 78.8 ml/min (by C-G formula based on Cr of 1.02).   Medical History: Past Medical History  Diagnosis Date  . Diabetes mellitus without complication   . Herniated cervical disc     c2-T1 fusion  . Chronic pain     back pain    Medications:  Scheduled:  . nitroGLYCERIN  0.4 mg Sublingual Q15 min   Infusions:  . heparin     Followed by  . heparin      Assessment: 63 yo c/o CP, R/o ACS.   Goal of Therapy:  Heparin level 0.3-0.7 units/ml Monitor platelets by anticoagulation protocol: Yes   Plan:   Baseline coags stat  Heparin 4000 unit bolus IV x1  Start drip @ 1000 units/hr  Daily CBC/HL  Check 1st HL in 6 hours  Dorrene German 06/27/2013,1:04 AM

## 2013-06-27 NOTE — CV Procedure (Signed)
Kristopher [name] is a 63 y.o. male    [id number]  [id number] LOCATION:  FACILITY: [facility]  PHYSICIAN: Troy Sine, MD, [facility] [birth year]   DATE OF PROCEDURE:  06/27/2013    CARDIAC CATHETERIZATION/PERCUTANEOUS CORONARY INTERVENTION    HISTORY: Kristopher [name] is a 63 year old male who has a history of type 2 diabetes mellitus, hypertension, atrial fibrillation, status post ablation, who was admitted to [facility] on 06/27/2013 with complaints of increasing chest pain radiating to his upper extremities and back. Cardiac markers were positive with a troponin of 7.54 compatible with a non-ST segment elevation MI. He now presents for definitive cardiac catheterization and possible coronary intervention.     PROCEDURE:  The patient was brought to the second floor [facility] cath lab in the postabsorptive state. He was premedicated with Versed 1 mg and fentanyl 25 mcg. His right groin was prepped and shaved in usual sterile fashion. Xylocaine 1% was used for local anesthesia. A 5 French sheath was inserted into the right femoral artery. Diagnostic catheterizatiion was done with 5 [place] LF4, FR4, and pigtail catheters. Left ventriculography was done with 25cc Omnipaque contrast. The demonstration of 90-95% stenosis in the distal right coronary artery this was made to attempt percutaneous cardiac intervention or arterial sheath was upgraded to a 6 [place] sheath. Angiomax bolus and infusion was admininstered and  Brilinta  180 mg was given for antiplatelet therapy. A 6 [place] FR4 guide was inserted. ACT was therapeutic therapeutic. A Prowaterwire was advanced down the RCA to the distal RCA. Predilatation was done with a 2.5x15 mm Trek balloon. A 3.0x23 mm Xience Alpine DES stent was then inserted and deployed  at 12 and 13 atmospheres. A 3.25x15 mm [place] Trek was used for post stent dilatation. Scout angiography confirmed an excellent angiographic result. There is no evidence for dissection. There was  brisk TIMI-3 flow. The arterial sheath was sutured in place with plans for an additional 2 hours of Angiomax infusion prior to discontinuance. The patient left the catheterization laboratory chest pain free with stable hemodynamics.   HEMODYNAMICS:   Central Aorta: 91/60   Left Ventricle: 91/18  During the procedure his fluids were increased to 150 cc per hour; he left the catheterization laboratory with a systolic blood pressure of 105/70.  ANGIOGRAPHY:  1. Left main: Angiographically normal and bifurcated into the LAD and the circumflex system  2. LAD: 10-20% smooth proximal narrowing prior to the takeoff of the first diagonal vessel and in the region of 2 septal perforating artery proximal takeoffs. The LAD extended to the apex. 3. Left circumflex: Angiographically normal the circumflex vessel which gave rise to 2 marginal vessels and ended in a small posterolateral coronary artery. 4. Right coronary artery: Large right coronary artery which had diffuse 90% stenosis just beyond the acute margin prior to the PDA takeoff. The PLA was small caliber vessel arising from the RCA.  RAO left ventriculography revealed mild/moderate LV function with an ejection fraction of approximately 45-50%. There was moderate hypocontractility in the inferior wall. There was no evidence of mitral regurgitation.  Following percutaneous coronary intervention to the distal RCA with PTCA, insertion of a 3.0x23 mm Xience Alpine DES stent, postdilated 3.25 mm, the 90% stenosis was reduced to 0%. There was brisk TIMI-3 flow. There is no evidence for dissection.    IMPRESSION:  Mild/moderate LV dysfunction with  inferior wall hypocontractility and ejection fraction of approximately 45-50%.  Coronary artery disease with smooth 10 to 20% narrowing in the  LAD; normal left circumflex artery; and diffuse 90% distal RCA stenosis beyond the acute margin and proximal to the PDA takeoff.  Successful R. intervention to the  distal right coronary artery with open insertion of a 3.0x23 mm Xience Alp ine DES stent postdilated 3.25 mm.  Angiomax/IC nitroglycerin/oral Brilinta.   Troy Sine, MD, [facility] 06/27/2013 3:56 PM

## 2013-06-27 NOTE — H&P (Signed)
History and Physical  Patient ID: Kristopher [name] MRN: [id number], SOB: 1950/12/16 63 y.o. Date of Encounter: 06/27/2013, 3:31 AM  Primary Physician: Chancy Milroy in [facility] Primary Cardiologist: last saw Bahsahn at [facility] ~15 yrs ago  Chief Complaint: chest pain  HPI: 63 y.o. male w/ PMHx significant for DM2, HTN, h.o afib s/p ablation who presented to [facility] and transferred to [facility] on 06/27/2013 with complaints of chest pain.  Sudden onset in the center of his chest that radiated to his upper extremities and went into his back. Occurred at rest while watching TV. No associated nausea or SOB. + Diaphoretic. 9/10 at ER presentation, reduced to 7 with morphine which then caused nausea and vomiting and then further reduced to 2 with nitro PRN. Still with 2/10 chest pain now.  He has been feeling poorly lately and actually sought care at an urgent care yesterday with concerns for viral infection. Flu negative per report. Denies fever.   Regarding his cardiac history, he has been diagnosed with a chronic pericardial effusion. Records here indicate presence in 2006 on a CT and is also visible on the CT today. He reports having serial echos, most recent in the last 1-2 years that shows persistent pericardial effusion.  EKG revealed NSR with freq PVC in trigeminy pattern, no TW changes. CXR was without acute cardiopulmonary abnormalities. CT negative for dissection or PE. Labs are significant for elevated troponin.   Past Medical History  Diagnosis Date  . Diabetes mellitus without complication   . Herniated cervical disc     c2-T1 fusion  . Chronic pain     back pain     Surgical History:  Past Surgical History  Procedure Laterality Date  . Back surgery      fusion c3-t1     Home Meds: Prior to Admission medications   Medication Sig Start Date End Date Taking? Authorizing Provider  aspirin EC 325 MG tablet Take 325 mg by mouth daily.   Yes Historical Provider, MD  baclofen  (LIORESAL) 10 MG tablet Take 10 mg by mouth 3 (three) times daily.   Yes Historical Provider, MD  buPROPion (WELLBUTRIN XL) 300 MG 24 hr tablet Take 300 mg by mouth daily.   Yes Historical Provider, MD  cholecalciferol (VITAMIN D) 1000 UNITS tablet Take 1,000 Units by mouth daily.   Yes Historical Provider, MD  Cinnamon 500 MG capsule Take 1,000 mg by mouth 2 (two) times daily.   Yes Historical Provider, MD  cyclobenzaprine (FLEXERIL) 10 MG tablet Take 10 mg by mouth 3 (three) times daily as needed for muscle spasms.   Yes Historical Provider, MD  fenofibrate 54 MG tablet Take 54 mg by mouth daily.   Yes Historical Provider, MD  gabapentin (NEURONTIN) 300 MG capsule Take 600-900 mg by mouth 3 (three) times daily. Take 2 capsules (666m) twice a day Take 3 capsules (9025m at bedtime   Yes Historical Provider, MD  glimepiride (AMARYL) 2 MG tablet Take 2 mg by mouth daily with breakfast.   Yes Historical Provider, MD  ibuprofen (ADVIL,MOTRIN) 200 MG tablet Take 600 mg by mouth every 6 (six) hours as needed for moderate pain.   Yes Historical Provider, MD  sitaGLIPtin-metformin (JANUMET) 50-500 MG per tablet Take 1 tablet by mouth 2 (two) times daily with a meal.   Yes Historical Provider, MD  vitamin B-12 (CYANOCOBALAMIN) 1000 MCG tablet Take 1,000 mcg by mouth daily.   Yes Historical Provider, MD    Allergies:  Allergies  Allergen Reactions  . Codeine Nausea And Vomiting  . Other Nausea And Vomiting    The patient states that he has had  a reaction to all narcotics. He can take them though.    History   Social History  . Marital Status: Married    Spouse Name: N/A    Number of Children: N/A  . Years of Education: N/A   Occupational History  . Not on file.   Social History Main Topics  . Smoking status: Never Smoker   . Smokeless tobacco: Never Used  . Alcohol Use: Yes     Comment: rarely  . Drug Use: No  . Sexual Activity: Yes   Other Topics Concern  . Not on file   Social  History Narrative  . No narrative on file     History reviewed. No pertinent family history.  Review of Systems: General: negative for chills, fever, night sweats or weight changes.  Cardiovascular: see HPI Dermatological: negative for rash Respiratory: negative for cough or wheezing Urologic: negative for hematuria Abdominal: negative for diarrhea, bright red blood per rectum, melena, or hematemesis. +N/v after narcotics Neurologic: negative for visual changes, syncope, or dizziness All other systems reviewed and are otherwise negative except as noted above.  Labs:   Lab Results  Component Value Date   WBC 8.3 06/26/2013   HGB 13.4 06/26/2013   HCT 39.0 06/26/2013   MCV 97.7 06/26/2013   PLT 190 06/26/2013    Recent Labs Lab 06/26/13 2226  NA 142  K 3.6*  CL 102  CO2 28  BUN 17  CREATININE 1.02  CALCIUM 9.4  GLUCOSE 145*    Recent Labs  06/27/13 0210  TROPONINI 1.83*   No results found for this basename: CHOL, HDL, LDLCALC, TRIG   No results found for this basename: DDIMER    Radiology/Studies:  Ct Angio Chest Pe W/cm &/or Wo Cm  06/26/2013   CLINICAL DATA:  Chest pain  EXAM: CT CHEST WITHOUT CONTRAST; CT ANGIOGRAPHY CHEST WITH CONTRAST  TECHNIQUE: Initially, multidetector CT images were obtained through the chest without intravenous contrast material administration. Subsequently, multidetector CT imaging of the chest was performed using the standard protocol during bolus administration of intravenous contrast. Multiplanar CT image reconstructions including MIPs were obtained to evaluate the vascular anatomy.  CONTRAST:  157m OMNIPAQUE IOHEXOL 350 MG/ML SOLN  COMPARISON:  Chest radiograph June 26, 2013 and chest CT September 21, 2004  FINDINGS: There is no demonstrable pulmonary embolus. There is no thoracic aortic aneurysm or dissection.  There is a moderate pericardial effusion.  There is subsegmental atelectasis in the lung bases. The lungs are otherwise clear. There is no  appreciable thoracic adenopathy.  In the visualized upper abdomen, there is fatty liver. There is postoperative change in the lower cervical spine. There are no blastic or lytic bone lesions. Thyroid appears normal.  Review of the MIP images confirms the above findings.  IMPRESSION: Moderate pericardial effusion. No edema or consolidation. No demonstrable pulmonary embolus. No thoracic aortic aneurysm or dissection.   Electronically Signed   By: WLowella GripM.D.   On: 06/26/2013 23:54   Dg Chest Port 1 View  06/26/2013   CLINICAL DATA:  Chest pain  EXAM: PORTABLE CHEST - 1 VIEW  COMPARISON:  August 20, 2004  FINDINGS: Lungs are clear. Heart is upper normal in size with normal pulmonary vascularity. No adenopathy. No pneumothorax. There is postoperative change in the visualized lower cervical spine.  IMPRESSION: No edema or  consolidation.   Electronically Signed   By: Lowella Grip M.D.   On: 06/26/2013 22:52     EKG: freq PVCs in trigeminy pattern, no ST or TW changes  Physical Exam: Blood pressure 112/68, pulse 107, temperature 98.6 F (37 C), temperature source Oral, resp. rate 18, height 5' 7"  (1.702 m), weight 81.647 kg (180 lb), SpO2 95.00%. General: Well developed, well nourished, in no acute distress. Head: Normocephalic, atraumatic, sclera non-icteric, nares are without discharge Neck: Supple. Negative for carotid bruits. JVD not elevated. Lungs: Clear bilaterally to auscultation without wheezes, rales, or rhonchi. Breathing is unlabored. Heart: RRR with S1 S2. No murmurs, rubs, or gallops appreciated. Abdomen: Soft, non-tender, non-distended with normoactive bowel sounds. No rebound/guarding. No obvious abdominal masses. Msk:  Strength and tone appear normal for age. Extremities: No edema. No clubbing or cyanosis. Distal pedal pulses are 2+ and equal bilaterally. Neuro: Alert and oriented X 3. Moves all extremities spontaneously. Psych:  Responds to questions appropriately with a  normal affect.   Problem List 1. Chest pain, c/w NSTEMI with elevated troponin 2. Diabetes 3. Persistent pericadial effusion 4. Chronic back pain 5. Remote history of afib, now in sinus 6. Hypokalemia  ASSESSMENT AND PLAN:  63 y.o. male w/ PMHx significant for DM2, HTN, h.o afib s/p ablation who presented to [facility] and transferred to [facility] on 06/27/2013 with complaints of chest pain --> elevated troponin consistent with NSTEMI.  Risk factors of age, gender and diabetes. History is consistent with classic symptoms of MI (however, the radiation to the back was concerning but dissection not present on CTA). Now with elevated troponin, plan on invasive strategy with catheterization. Continue aspirin, add beta blocker, switch fibrate to statin. Heparinize (do not think that effusion is due to active pericardial process so risk of hemorrhagic conversion is low). Due to continued symptoms of chest pain, will add nitro gtt.  The chronic pericardial effusion is rather interesting (over 8 yrs at least). Do not think that it is an active process contributing to current presentation but will get echo and check TSH and ANA as cursory workup.  Gentle hydration due to receiving IV contrast bolus for CTA and planned cath for today.  Low K --> replacing orally. (Frequent ectopy).  Prophylaxis Heparin gtt Full code  Signed, Jennife Zaucha C. MD 06/27/2013, 3:31 AM

## 2013-06-27 NOTE — Progress Notes (Signed)
Pt arrived to unit at 0445 with nitroglycerin ointment on right upper chest covered with Tegaderm. Removed ointment and residue at approximately 0545 after nitroglycerin gtt had been initiated. Pt chest pain starting to improve. Pain rated at 2/10 upon arrival and is 1/10 currently. Will continue to monitor. Johnsie Cancel, RN

## 2013-06-27 NOTE — H&P (View-Only) (Signed)
PROGRESS NOTE  Subjective:   Kristopher [name] is a 63 yo with hx of DM2, HTN, A-fib ( s/p ablation) who was admitted from [facility] with CP.  CTA was negative for PE or aortic aneurysm or dissection.  He was   found to have a moderate pericardial effusion on the CT .  He has no signs or symptoms or pericardial tamponade.  He has a hx of pericarditis for the past 9 years with occasional flare-ups.     No hx of CAD. Hx of cardiac cath - .  Cholesterol level "around 200"  Objective:    Vital Signs:   Temp:  [97.4 F (36.3 C)-98.6 F (37 C)] 97.4 F (36.3 C) (01/09 0730) Pulse Rate:  [72-107] 105 (01/09 0730) Resp:  [12-23] 23 (01/09 0730) BP: (90-141)/(57-97) 112/72 mmHg (01/09 0730) SpO2:  [92 %-100 %] 100 % (01/09 0730) Weight:  [180 lb (81.647 kg)-188 lb 4.4 oz (85.4 kg)] 188 lb 4.4 oz (85.4 kg) (01/09 0447)  Last BM Date: 06/26/13 `    24-hour weight change: Weight change:   Weight trends: [facility] Weights   06/26/13 2204 06/27/13 0447  Weight: 180 lb (81.647 kg) 188 lb 4.4 oz (85.4 kg)    Intake/Output:  01/08 0701 - 01/09 0700 In: 240.1 [P.O.:60; I.V.:180.1] Out: -      Physical Exam: BP 112/72  Pulse 105  Temp(Src) 97.4 F (36.3 C) (Oral)  Resp 23  Ht 5' 7"  (1.702 m)  Wt 188 lb 4.4 oz (85.4 kg)  BMI 29.48 kg/m2  SpO2 100%  General: Vital signs reviewed and noted.   Head: Normocephalic, atraumatic.  Eyes: conjunctivae/corneas clear.  EOM's intact.   Throat: normal  Neck:  normal, no JVD  Lungs:  few right basilar rales  Heart:  RR, frequent PVCs  Abdomen:  Soft, non-tender, non-distended    Extremities: Good right radial and ulnar pulses   Neurologic: A&O X3, CN II - XII are grossly intact.  Psych: Normal    Labs: BMET:  Recent Labs  06/26/13 2226 06/27/13 0618  NA 142 142  K 3.6* 3.9  CL 102 105  CO2 28 24  GLUCOSE 145* 193*  BUN 17 14  CREATININE 1.02 0.78  CALCIUM 9.4 8.2*    Liver function tests: No results found  for this basename: AST, ALT, ALKPHOS, BILITOT, PROT, ALBUMIN,  in the last 72 hours No results found for this basename: LIPASE, AMYLASE,  in the last 72 hours  CBC:  Recent Labs  06/26/13 2226 06/27/13 0618  WBC 8.3 7.1  NEUTROABS  --  5.5  HGB 13.4 11.3*  HCT 39.0 33.0*  MCV 97.7 98.8  PLT 190 165    Cardiac Enzymes:  Recent Labs  06/27/13 0210 06/27/13 0618  TROPONINI 1.83* 7.54*    Coagulation Studies:  Recent Labs  06/27/13 0210  LABPROT 12.8  INR 0.98    O  Other results:  EKG NSR, frequent PVCs, tiny inferior Q waves.  Medications:    Infusions: . sodium chloride 75 mL/hr at 06/27/13 0530  . heparin 1,000 Units/hr (06/27/13 0447)  . nitroGLYCERIN 5 mcg/min (06/27/13 0539)    Scheduled Medications: . [START ON 06/28/2013] aspirin EC  81 mg Oral Daily  . aspirin  325 mg Oral Once  . atorvastatin  80 mg Oral q1800  . baclofen  10 mg Oral TID  . buPROPion  300 mg Oral Daily  . gabapentin  600 mg Oral BID  WC  . gabapentin  900 mg Oral QHS  . insulin aspart  0-15 Units Subcutaneous TID WC  . metoCLOPramide (REGLAN) injection  5 mg Intravenous Q6H  . metoprolol tartrate  12.5 mg Oral BID  . sodium chloride  3 mL Intravenous Q12H    Assessment/ Plan:    1. CAD:  Possible an inferior MI.  No old ECG.  He now presents with symptoms c/w ACS.  Troponin levels are c/w MI.  He has small (but present) Q waves  on ECG. For cath later this am.  Discussed risks, benefits, options.  He understands and agrees to proceed.   2. Hyperlipidemia:  By his hx, chol levels have been elevated.  Started atorvastatin 80.  Will add lipid levels to the blood this am if possible.   3. Chronic pericarditis:  Assumed to be viral.  Has never tried colchicine.  Will start colchicine tomorrow.     Disposition:  Length of Stay: 1  Thayer Headings, Brooke Bonito., MD, [facility] 06/27/2013, 7:52 AM Office [phone] Pager [phone]

## 2013-06-27 NOTE — ED Notes (Signed)
EDP made aware of IStat Troponin

## 2013-06-27 NOTE — Progress Notes (Signed)
Right groin sheath removed and pressure held to site x 20 minutes. Hemostasis obtained. Pulses unchanged from initial assessment. Right femoral and rt pedal pulses palpable. Vital signs within normal limits. Dry sterile dressing and Tegaderm applied to site. Patient tolerated procedure well. Post instructions given per orders.

## 2013-06-27 NOTE — Progress Notes (Signed)
Echocardiogram 2D Echocardiogram with Definity has been performed.  Kristopher [name] 06/27/2013, 3:51 PM

## 2013-06-27 NOTE — Interval H&P Note (Signed)
Cath Lab Visit (complete for each Cath Lab visit)  Clinical Evaluation Leading to the Procedure:   ACS: yes  Non-ACS:    Anginal Classification: CCS III  Anti-ischemic medical therapy: Maximal Therapy (2 or more classes of medications)  Non-Invasive Test Results: No non-invasive testing performed  Prior CABG: No previous CABG      History and Physical Interval Note:  06/27/2013 9:12 AM  Kristopher [name]  has presented today for surgery, with the diagnosis of cp  The various methods of treatment have been discussed with the patient and family. After consideration of risks, benefits and other options for treatment, the patient has consented to  Procedure(s): LEFT HEART CATHETERIZATION WITH CORONARY ANGIOGRAM (N/A) as a surgical intervention .  The patient's history has been reviewed, patient examined, no change in status, stable for surgery.  I have reviewed the patient's chart and labs.  Questions were answered to the patient's satisfaction.     KELLY,THOMAS A

## 2013-06-27 NOTE — Progress Notes (Signed)
06/27/2013 0739  Elevated Trop results 7.54 given to Dr. Cathie Olden at bedside. Pt + for NSTEMI.

## 2013-06-27 NOTE — Care Management Note (Addendum)
    Page 1 of 1   06/27/2013     1:49:59 PM   CARE MANAGEMENT NOTE 06/27/2013  Patient:  Kristopher [name], Kristopher [name]   Account Number:  [id number]  Date Initiated:  06/27/2013  Documentation initiated by:  Elissa Hefty  Subjective/Objective Assessment:   adm w mi     Action/Plan:   lives w wife   Anticipated DC Date:     Anticipated DC Plan:        DC Planning Services  CM consult  Medication Assistance      Choice offered to / List presented to:             Status of service:   Medicare Important Message given?   (If response is "NO", the following Medicare IM given date fields will be blank) Date Medicare IM given:   Date Additional Medicare IM given:    Discharge Disposition:    Per UR Regulation:  Reviewed for med. necessity/level of care/duration of stay  If discussed at [place] of Stay Meetings, dates discussed:    Comments:  1/9 1349 debbie Elverna Caffee rn,bsn left pt 30day free and copay assist  card for brilinta in room.

## 2013-06-27 NOTE — ED Notes (Signed)
RN to draw labs.

## 2013-06-28 ENCOUNTER — Encounter (HOSPITAL_COMMUNITY): Payer: Self-pay | Admitting: *Deleted

## 2013-06-28 LAB — BASIC METABOLIC PANEL
BUN: 12 mg/dL (ref 6–23)
CALCIUM: 9.1 mg/dL (ref 8.4–10.5)
CO2: 28 meq/L (ref 19–32)
CREATININE: 0.84 mg/dL (ref 0.50–1.35)
Chloride: 103 mEq/L (ref 96–112)
Glucose, Bld: 130 mg/dL — ABNORMAL HIGH (ref 70–99)
Potassium: 4.1 mEq/L (ref 3.7–5.3)
Sodium: 142 mEq/L (ref 137–147)

## 2013-06-28 LAB — GLUCOSE, CAPILLARY
Glucose-Capillary: 135 mg/dL — ABNORMAL HIGH (ref 70–99)
Glucose-Capillary: 128 mg/dL — ABNORMAL HIGH (ref 70–99)

## 2013-06-28 LAB — CBC
HCT: 35.1 % — ABNORMAL LOW (ref 39.0–52.0)
Hemoglobin: 11.7 g/dL — ABNORMAL LOW (ref 13.0–17.0)
MCH: 33.3 pg (ref 26.0–34.0)
MCHC: 33.3 g/dL (ref 30.0–36.0)
MCV: 100 fL (ref 78.0–100.0)
PLATELETS: 165 10*3/uL (ref 150–400)
RBC: 3.51 MIL/uL — ABNORMAL LOW (ref 4.22–5.81)
RDW: 14.2 % (ref 11.5–15.5)
WBC: 7.9 10*3/uL (ref 4.0–10.5)

## 2013-06-28 MED ORDER — TICAGRELOR 90 MG PO TABS: 90.0000 mg | ORAL_TABLET | Freq: Two times a day (BID) | ORAL | Status: DC

## 2013-06-28 MED ORDER — SITAGLIPTIN PHOS-METFORMIN HCL 50-500 MG PO TABS: 1.0000 | ORAL_TABLET | Freq: Two times a day (BID) | ORAL | Status: DC

## 2013-06-28 MED ORDER — ASPIRIN EC 81 MG PO TBEC: 81.0000 mg | DELAYED_RELEASE_TABLET | Freq: Every day | ORAL | Status: DC

## 2013-06-28 MED ORDER — METOPROLOL SUCCINATE ER 25 MG PO TB24: 25.0000 mg | ORAL_TABLET | Freq: Every day | ORAL | Status: DC

## 2013-06-28 MED ORDER — NITROGLYCERIN 0.4 MG SL SUBL: 0.4000 mg | SUBLINGUAL_TABLET | SUBLINGUAL | Status: DC | PRN

## 2013-06-28 MED ORDER — COLCHICINE 0.6 MG PO TABS: 0.6000 mg | ORAL_TABLET | Freq: Two times a day (BID) | ORAL | Status: DC

## 2013-06-28 MED ORDER — ATORVASTATIN CALCIUM 80 MG PO TABS: 80.0000 mg | ORAL_TABLET | Freq: Every day | ORAL | Status: DC

## 2013-06-28 NOTE — Progress Notes (Signed)
SUBJECTIVE: The patient is doing well today.  At this time, he denies chest pain, shortness of breath, or any new concerns.  Admitted 1-9 with NSTEMI - s/p intervention to distal RCA yesterday with DES; EF 45-50% by cath.   Echo 06-27-13 demonstrated EF 12%, grade 2 diastolic dysfunction, small circumferential pericardial effusion, LA 33.  Patient with chronic pericardial effusion - TSH normal, ANA pending.   Previously followed by Dr Omelia Blackwater at [facility], but has not been seen there for several years, s/p PVI X2 15 years ago per his report.   CURRENT MEDICATIONS: . aspirin EC  81 mg Oral Daily  . atorvastatin  80 mg Oral q1800  . baclofen  10 mg Oral TID  . buPROPion  300 mg Oral Daily  . colchicine  0.6 mg Oral BID  . gabapentin  600 mg Oral BID WC  . gabapentin  900 mg Oral QHS  . insulin aspart  0-15 Units Subcutaneous TID WC  . metoCLOPramide (REGLAN) injection  5 mg Intravenous Q6H  . metoprolol tartrate  12.5 mg Oral BID  . Ticagrelor  90 mg Oral BID   . nitroGLYCERIN Stopped (06/27/13 2140)    OBJECTIVE: Physical Exam: Filed Vitals:   06/28/13 0100 06/28/13 0200 06/28/13 0400 06/28/13 0500  BP: 93/70 82/58 93/46  106/60  Pulse: 90 87    Temp:   98.1 F (36.7 C)   TempSrc:   Oral   Resp:   14   Height:      Weight:      SpO2: 94% 97% 95%     Intake/Output Summary (Last 24 hours) at 06/28/13 1016 Last data filed at 06/28/13 0130  Gross per 24 hour  Intake 2094.17 ml  Output   1475 ml  Net 619.17 ml    Telemetry reveals sinus rhythm with PVC's  GEN- The patient is well appearing, alert and oriented x 3 today.   Head- normocephalic, atraumatic Eyes-  Sclera clear, conjunctiva pink Ears- hearing intact Oropharynx- clear Neck- supple, no JVP Lymph- no cervical lymphadenopathy Lungs- Clear to ausculation bilaterally, normal work of breathing Heart- Regular rate and rhythm, no murmurs, rubs or gallops, PMI not laterally displaced GI- soft, NT, ND, +  BS Extremities- no clubbing, cyanosis, or edema Skin- no rash or lesion Psych- euthymic mood, full affect Neuro- strength and sensation are intact  LABS: Basic Metabolic Panel:  Recent Labs  06/27/13 0618 06/28/13 0315  NA 142 142  K 3.9 4.1  CL 105 103  CO2 24 28  GLUCOSE 193* 130*  BUN 14 12  CREATININE 0.78 0.84  CALCIUM 8.2* 9.1   CBC:  Recent Labs  06/26/13 2226 06/27/13 0618 06/28/13 0315  WBC 8.3 7.1 7.9  NEUTROABS  --  5.5  --   HGB 13.4 11.3* 11.7*  HCT 39.0 33.0* 35.1*  MCV 97.7 98.8 100.0  PLT 190 165 165   Cardiac Enzymes:  Recent Labs  06/27/13 0618 06/27/13 1200 06/27/13 1730  TROPONINI 7.54* >20.00* >20.00*   Fasting Lipid Panel:  Recent Labs  06/27/13 0630  CHOL 175  HDL 31*  LDLCALC 119*  TRIG 126  CHOLHDL 5.6   Thyroid Function Tests:  Recent Labs  06/27/13 0618  TSH 2.927    RADIOLOGY: Ct Angio Chest Pe W/cm &/or Wo Cm 06/26/2013   CLINICAL DATA:  Chest pain  EXAM: CT CHEST WITHOUT CONTRAST; CT ANGIOGRAPHY CHEST WITH CONTRAST  TECHNIQUE: Initially, multidetector CT images were obtained through the chest without intravenous contrast  material administration. Subsequently, multidetector CT imaging of the chest was performed using the standard protocol during bolus administration of intravenous contrast. Multiplanar CT image reconstructions including MIPs were obtained to evaluate the vascular anatomy.  CONTRAST:  166m OMNIPAQUE IOHEXOL 350 MG/ML SOLN  COMPARISON:  Chest radiograph June 26, 2013 and chest CT September 21, 2004  FINDINGS: There is no demonstrable pulmonary embolus. There is no thoracic aortic aneurysm or dissection.  There is a moderate pericardial effusion.  There is subsegmental atelectasis in the lung bases. The lungs are otherwise clear. There is no appreciable thoracic adenopathy.  In the visualized upper abdomen, there is fatty liver. There is postoperative change in the lower cervical spine. There are no blastic or  lytic bone lesions. Thyroid appears normal.  Review of the MIP images confirms the above findings.  IMPRESSION: Moderate pericardial effusion. No edema or consolidation. No demonstrable pulmonary embolus. No thoracic aortic aneurysm or dissection.   Electronically Signed   By: WLowella GripM.D.   On: 06/26/2013 23:54   Dg Chest Port 1 View 06/26/2013   CLINICAL DATA:  Chest pain  EXAM: PORTABLE CHEST - 1 VIEW  COMPARISON:  August 20, 2004  FINDINGS: Lungs are clear. Heart is upper normal in size with normal pulmonary vascularity. No adenopathy. No pneumothorax. There is postoperative change in the visualized lower cervical spine.  IMPRESSION: No edema or consolidation.   Electronically Signed   By: WLowella GripM.D.   On: 06/26/2013 22:52   ASSESSMENT AND PLAN:  Active Problems:   NSTEMI (non-ST elevated myocardial infarction)  1. NSTEMI Doing well sp PCI Discharge to home today. Follow-up with SRichardson Doppfor transition of care visit Follow-up wth Dr KClaiborne BillingsGiven reduced EF, would add ace inhbitor however blood pressure is too soft here. Consider adding ace inhibitor on office follow-up. Repeat echo when he sees Dr KClaiborne Billings

## 2013-06-28 NOTE — Progress Notes (Signed)
CARDIAC REHAB PHASE I   PRE:  Rate/Rhythm: 101 ST with Trigeminy PVC  BP:  Sitting: 99/67     SaO2: 97 RA  MODE:  Ambulation: 870 ft   POST:  Rate/Rhythm: 104 ST with Trigeminy PVC  BP:  Sitting: 104/63    SaO2: 98 RA  Pt walked 870 ft with no c/o.  Pt walked independently, with a very steady gait at a quick pace.  Reviewed education and NTG use with patient.  Pt stated he did not have any questions and that he is interested in CRP II at [facility] if his work schedule allows.  [id number]  Lillia Dallas MS, ACSM RCEP 10:51 AM 06/28/2013

## 2013-06-28 NOTE — Discharge Instructions (Signed)
PLEASE REMEMBER TO BRING ALL OF YOUR MEDICATIONS TO EACH OF YOUR FOLLOW-UP OFFICE VISITS. ° °PLEASE ATTEND ALL SCHEDULED FOLLOW-UP APPOINTMENTS.  ° °Activity: Increase activity slowly as tolerated. You may shower, but no soaking baths (or swimming) for 1 week. No driving for 1 week. No lifting over 5 lbs for 2 weeks. No sexual activity for 1 week.  ° °You May Return to Work: in 3 weeks (if applicable) ° °Wound Care: You may wash cath site gently with soap and water. Keep cath site clean and dry. If you notice pain, swelling, bleeding or pus at your cath site, please call [phone]. ° ° ° °Cardiac Cath Site Care °Refer to this sheet in the next few weeks. These instructions provide you with information on caring for yourself after your procedure. Your caregiver may also give you more specific instructions. Your treatment has been planned according to current medical practices, but problems sometimes occur. Call your caregiver if you have any problems or questions after your procedure. °HOME CARE INSTRUCTIONS °· You may shower 24 hours after the procedure. Remove the bandage (dressing) and gently wash the site with plain soap and water. Gently pat the site dry.  °· Do not apply powder or lotion to the site.  °· Do not sit in a bathtub, swimming pool, or whirlpool for 5 to 7 days.  °· No bending, squatting, or lifting anything over 10 pounds (4.5 kg) as directed by your caregiver.  °· Inspect the site at least twice daily.  °· Do not drive home if you are discharged the same day of the procedure. Have someone else drive you.  °· You may drive 24 hours after the procedure unless otherwise instructed by your caregiver.  °What to expect: °· Any bruising will usually fade within 1 to 2 weeks.  °· Blood that collects in the tissue (hematoma) may be painful to the touch. It should usually decrease in size and tenderness within 1 to 2 weeks.  °SEEK IMMEDIATE MEDICAL CARE IF: °· You have unusual pain at the site or down the  affected limb.  °· You have redness, warmth, swelling, or pain at the site.  °· You have drainage (other than a small amount of blood on the dressing).  °· You have chills.  °· You have a fever or persistent symptoms for more than 72 hours.  °· You have a fever and your symptoms suddenly get worse.  °· Your leg becomes pale, cool, tingly, or numb.  °· You have heavy bleeding from the site. Hold pressure on the site.  °Document Released: 07/08/2010 Document Revised: 05/25/2011 Document Reviewed:  ° °

## 2013-06-28 NOTE — Discharge Summary (Signed)
CARDIOLOGY DISCHARGE SUMMARY   Patient ID: GEMAYEL MASCIO MRN: [id number] DOB/AGE: March 22, 1951 63 y.o.  Admit date: 06/26/2013 Discharge date: 06/28/2013  PCP: Dr. Chancy Milroy in [facility] Primary Cardiologist: will be TK  Primary Discharge Diagnosis:  Inferior MI, 3.0x23 mm Xience Alpine DES stent to the RCA  Secondary Discharge Diagnosis:  Active Problems:   NSTEMI (non-ST elevated myocardial infarction), inferior   Pericardial effusion   Dyslipidemia  Consults: None  Procedures: Cardiac catheterization, coronary arteriogram, left ventriculogram, PTCA and drug-eluting stent to the RCA  Hospital Course: CLELAND SIMKINS is a 63 y.o. male with no history of CAD. He has a history of DM2, HTN, h.o afib s/p ablation and pericardial effusion. He had prolonged chest pain and came to the emergency room where he was admitted for further evaluation and treatment.  A CT angiogram of the chest was initially performed to further evaluate him. It showed a pericardial effusion but no significant pulmonary abnormalities including no PE.  His cardiac enzymes were elevated with a troponin going to greater than 20. He was taken to the cath lab on 06/27/2013, results are below. He had a drug-eluting stent to the RCA, with medical therapy otherwise recommended. His EF was slightly decreased at 45-50%. He tolerated the procedure well. He will remain on dual antiplatelet therapy and followup in the office.  He was started on aspirin and a low dose of a beta blocker. However, his blood pressure was too low for an ACE inhibitor. Consideration can be given to increasing his beta blocker or adding an ACE inhibitor as an outpatient.  He has history of diabetes and blood sugars were managed with sliding scale insulin. He will resume his home medications at discharge. A lipid profile was performed which showed dyslipidemia, his HDL is low and his LDL is elevated. He was started on Lipitor 80 mg daily and will followup in  the office.  His oral diabetes medications were held on admission and he was managed with sliding scale insulin. He was started on his Amaryl after the heart catheterization and will resume the January in 48 hours.  He has a history of a pericardial effusion and it was seen on his CT angiogram of the chest. A 2-D echocardiogram was performed, results below. He has a moderate pericardial effusion but no signs or symptoms of tamponade. This can be followed as an outpatient and he should get a repeat echocardiogram in 3 months.  He was seen by cardiac rehabilitation and educated on heart-healthy lifestyle modifications, exercise guidelines and MI restrictions. He is interested in phase II cardiac rehabilitation and will be referred.  On 1/10, he was seen by Dr. Rayann Heman. Dr. Rayann Heman reviewed all data. He was ambulating without chest pain or shortness of breath. Dr. Rayann Heman felt no further inpatient workup is indicated and he is considered stable for discharge, to follow up as an outpatient.   Labs:   Lab Results  Component Value Date   WBC 7.9 06/28/2013   HGB 11.7* 06/28/2013   HCT 35.1* 06/28/2013   MCV 100.0 06/28/2013   PLT 165 06/28/2013     Recent Labs Lab 06/28/13 0315  NA 142  K 4.1  CL 103  CO2 28  BUN 12  CREATININE 0.84  CALCIUM 9.1  GLUCOSE 130*    Recent Labs  06/27/13 0618 06/27/13 1200 06/27/13 1730  TROPONINI 7.54* >20.00* >20.00*   Lipid Panel     Component Value Date/Time   CHOL 175 06/27/2013  0630   TRIG 126 06/27/2013 0630   HDL 31* 06/27/2013 0630   CHOLHDL 5.6 06/27/2013 0630   VLDL 25 06/27/2013 0630   LDLCALC 119* 06/27/2013 0630    Recent Labs  06/27/13 0210  INR 0.98      RADIOLOGY:  Ct Angio Chest Pe W/cm &/or Wo Cm 06/26/2013 CLINICAL DATA: Chest pain EXAM: CT CHEST WITHOUT CONTRAST; CT ANGIOGRAPHY CHEST WITH CONTRAST TECHNIQUE: Initially, multidetector CT images were obtained through the chest without intravenous contrast material administration.  Subsequently, multidetector CT imaging of the chest was performed using the standard protocol during bolus administration of intravenous contrast. Multiplanar CT image reconstructions including MIPs were obtained to evaluate the vascular anatomy. CONTRAST: 16m OMNIPAQUE IOHEXOL 350 MG/ML SOLN COMPARISON: Chest radiograph June 26, 2013 and chest CT September 21, 2004 FINDINGS: There is no demonstrable pulmonary embolus. There is no thoracic aortic aneurysm or dissection. There is a moderate pericardial effusion. There is subsegmental atelectasis in the lung bases. The lungs are otherwise clear. There is no appreciable thoracic adenopathy. In the visualized upper abdomen, there is fatty liver. There is postoperative change in the lower cervical spine. There are no blastic or lytic bone lesions. Thyroid appears normal. Review of the MIP images confirms the above findings. IMPRESSION: Moderate pericardial effusion. No edema or consolidation. No demonstrable pulmonary embolus. No thoracic aortic aneurysm or dissection. Electronically Signed By: WLowella GripM.D. On: 06/26/2013 23:54   Dg Chest Port 1 View 06/26/2013 CLINICAL DATA: Chest pain EXAM: PORTABLE CHEST - 1 VIEW COMPARISON: August 20, 2004 FINDINGS: Lungs are clear. Heart is upper normal in size with normal pulmonary vascularity. No adenopathy. No pneumothorax. There is postoperative change in the visualized lower cervical spine. IMPRESSION: No edema or consolidation. Electronically Signed By: WLowella GripM.D. On: 06/26/2013 22:52   Cardiac Cath: 06/27/2013 ANGIOGRAPHY:  1. Left main: Angiographically normal and bifurcated into the LAD and the circumflex system  2. LAD: 10-20% smooth proximal narrowing prior to the takeoff of the first diagonal vessel and in the region of 2 septal perforating artery proximal takeoffs. The LAD extended to the apex.  3. Left circumflex: Angiographically normal the circumflex vessel which gave rise to 2 marginal vessels  and ended in a small posterolateral coronary artery.  4. Right coronary artery: Large right coronary artery which had diffuse 90% stenosis just beyond the acute margin prior to the PDA takeoff. The PLA was small caliber vessel arising from the RCA.  RAO left ventriculography revealed mild/moderate LV function with an ejection fraction of approximately 45-50%. There was moderate hypocontractility in the inferior wall. There was no evidence of mitral regurgitation.  Following percutaneous coronary intervention to the distal RCA with PTCA, insertion of a 3.0x23 mm Xience Alpine DES stent, postdilated 3.25 mm, the 90% stenosis was reduced to 0%. There was brisk TIMI-3 flow. There is no evidence for dissection.  IMPRESSION:  Mild/moderate LV dysfunction with inferior wall hypocontractility and ejection fraction of approximately 45-50%.  Coronary artery disease with smooth 10 to 20% narrowing in the LAD; normal left circumflex artery; and diffuse 90% distal RCA stenosis beyond the acute margin and proximal to the PDA takeoff.  Successful R. intervention to the distal right coronary artery with open insertion of a 3.0x23 mm Xience Alp ine DES stent postdilated 3.25 mm.  EKG: 06/28/2013 Sinus rhythm with PVCs Vent. rate 93 BPM PR interval 150 ms QRS duration 94 ms QT/QTc 368/457 ms P-R-T axes 51 38 31  Echo: 06/27/2013 Study Conclusions - Left  ventricle: The cavity size was normal. Wall thickness was normal. Akinetic basal to mid inferior and inferoseptal walls. The estimated ejection fraction was 40%. Features are consistent with a pseudonormal left ventricular filling pattern, with concomitant abnormal relaxation and increased filling pressure (grade 2 diastolic dysfunction). - Aortic valve: There was no stenosis. - Mitral valve: Mildly calcified annulus. Normal thickness leaflets . No significant regurgitation. - Right ventricle: Poorly visualized. The cavity size was normal. Systolic  function was normal. - Pulmonary arteries: No complete TR doppler jet so unable to estimate PA systolic pressure. - Systemic veins: IVC poorly visualized. - Pericardium, extracardiac: Small circumferential pericardial effusion with no evidence for tamponade. Impressions: - Technically difficult study with poor acoustic windows even after Definity was used. Normal LV size with EF 40%. Basal to mid inferoseptal and inferior akinesis. Moderate diastolic dysfunction. Probably normal RV size and systolic function (not well-visualized). No significant valvular abnormalities.   FOLLOW UP PLANS AND APPOINTMENTS Allergies  Allergen Reactions  . Codeine Nausea And Vomiting  . Other Nausea And Vomiting    The patient states that he has had  a reaction to all narcotics. He can take them though.     Medication List         aspirin EC 81 MG tablet  Take 1 tablet (81 mg total) by mouth daily.     atorvastatin 80 MG tablet  Commonly known as:  LIPITOR  Take 1 tablet (80 mg total) by mouth daily.     baclofen 10 MG tablet  Commonly known as:  LIORESAL  Take 10 mg by mouth 3 (three) times daily.     buPROPion 300 MG 24 hr tablet  Commonly known as:  WELLBUTRIN XL  Take 300 mg by mouth daily.     cholecalciferol 1000 UNITS tablet  Commonly known as:  VITAMIN D  Take 1,000 Units by mouth daily.     Cinnamon 500 MG capsule  Take 1,000 mg by mouth 2 (two) times daily.     colchicine 0.6 MG tablet  Take 1 tablet (0.6 mg total) by mouth 2 (two) times daily.     cyclobenzaprine 10 MG tablet  Commonly known as:  FLEXERIL  Take 10 mg by mouth 3 (three) times daily as needed for muscle spasms.     fenofibrate 54 MG tablet  Take 54 mg by mouth daily.     gabapentin 300 MG capsule  Commonly known as:  NEURONTIN  - Take 600-900 mg by mouth 3 (three) times daily. Take 2 capsules (664m) twice a day  - Take 3 capsules (9095m at bedtime     glimepiride 2 MG tablet  Commonly known as:   AMARYL  Take 2 mg by mouth daily with breakfast.     ibuprofen 200 MG tablet  Commonly known as:  ADVIL,MOTRIN  Take 600 mg by mouth every 6 (six) hours as needed for moderate pain.     metoprolol succinate 25 MG 24 hr tablet  Commonly known as:  TOPROL XL  Take 1 tablet (25 mg total) by mouth daily.     nitroGLYCERIN 0.4 MG SL tablet  Commonly known as:  NITROSTAT  Place 1 tablet (0.4 mg total) under the tongue every 5 (five) minutes as needed for chest pain.     sitaGLIPtin-metformin 50-500 MG per tablet  Commonly known as:  JANUMET  Take 1 tablet by mouth 2 (two) times daily with a meal. Hold for 48 hours, restart on 06/30/2013.  Ticagrelor 90 MG Tabs tablet  Commonly known as:  BRILINTA  Take 1 tablet (90 mg total) by mouth 2 (two) times daily.     vitamin B-12 1000 MCG tablet  Commonly known as:  CYANOCOBALAMIN  Take 1,000 mcg by mouth daily.        Discharge Orders   Future Appointments Provider Department Dept Phone   07/09/2013 11:10 AM Liliane Shi, PA-C [facility] [phone]   Future Orders Complete By Expires   Amb Referral to Cardiac Rehabilitation  As directed    Diet - low sodium heart healthy  As directed    Diet Carb Modified  As directed    Increase activity slowly  As directed      Follow-up Information   Follow up with Richardson Dopp, PA-C On 07/09/2013.   Specialty:  Physician Assistant   Contact information:   [address] [address] [phone]       BRING ALL MEDICATIONS WITH YOU TO FOLLOW UP APPOINTMENTS  Time spent with patient to include physician time: 39 min Signed: Rosaria Ferries, PA-C 06/28/2013, 12:40 PM Co-Sign MD  Thompson Grayer MD

## 2013-06-30 LAB — ANA: Anti Nuclear Antibody(ANA): NEGATIVE

## 2013-06-30 MED FILL — Sodium Chloride IV Soln 0.9%: INTRAVENOUS | Qty: 50 | Status: AC

## 2013-06-30 NOTE — ED Provider Notes (Signed)
Medical screening examination/treatment/procedure(s) were conducted as a shared visit with non-physician practitioner(s) and myself.  I personally evaluated the patient during the encounter.  EKG Interpretation    Date/Time:  Thursday June 26 2013 22:30:44 EST Ventricular Rate:  105 PR Interval:  160 QRS Duration: 100 QT Interval:  353 QTC Calculation: 466 R Axis:   86 Text Interpretation:  Sinus tachycardia Ventricular trigeminy Borderline right axis deviation No significant change since last tracing Confirmed by Maryan Rued  MD, Chistopher Mangino ([id number]) on 06/26/2013 10:34:40 PM            Pt with concerning CP and appears uncomfortable.  HD stable at this time however concern for dissection given sx and onset vs MI.  EKG is non-specific but no criteria for acute MI.  If CT neg will start NTG gtt and heparin.  Will call cards.  CRITICAL CARE Performed by: Blanchie Dessert Total critical care time: 30 Critical care time was exclusive of separately billable procedures and treating other patients. Critical care was necessary to treat or prevent imminent or life-threatening deterioration. Critical care was time spent personally by me on the following activities: development of treatment plan with patient and/or surrogate as well as nursing, discussions with consultants, evaluation of patient's response to treatment, examination of patient, obtaining history from patient or surrogate, ordering and performing treatments and interventions, ordering and review of laboratory studies, ordering and review of radiographic studies, pulse oximetry and re-evaluation of patient's condition.   Blanchie Dessert, MD 06/30/13 [phone]

## 2013-07-01 ENCOUNTER — Encounter: Payer: Self-pay | Admitting: *Deleted

## 2013-07-02 ENCOUNTER — Telehealth: Payer: Self-pay | Admitting: *Deleted

## 2013-07-02 NOTE — Telephone Encounter (Signed)
Faxed cardiac rehab orders

## 2013-07-09 ENCOUNTER — Observation Stay (HOSPITAL_COMMUNITY)
Admission: AD | Admit: 2013-07-09 | Discharge: 2013-07-10 | Disposition: A | Discharge status: Home / Self Care | Payer: 59 | Source: Ambulatory Visit | Attending: Cardiovascular Disease | Admitting: Cardiovascular Disease

## 2013-07-09 ENCOUNTER — Encounter: Payer: Self-pay | Admitting: Physician Assistant

## 2013-07-09 ENCOUNTER — Encounter (HOSPITAL_COMMUNITY): Payer: Self-pay | Admitting: General Practice

## 2013-07-09 ENCOUNTER — Ambulatory Visit (INDEPENDENT_AMBULATORY_CARE_PROVIDER_SITE_OTHER): Payer: 59 | Admitting: Physician Assistant

## 2013-07-09 VITALS — BP 112/74 | HR 97 | Ht 67.0 in | Wt 181.0 lb

## 2013-07-09 DIAGNOSIS — I200 Unstable angina: Secondary | ICD-10-CM

## 2013-07-09 DIAGNOSIS — I2510 Atherosclerotic heart disease of native coronary artery without angina pectoris: Principal | ICD-10-CM | POA: Insufficient documentation

## 2013-07-09 DIAGNOSIS — Z7982 Long term (current) use of aspirin: Secondary | ICD-10-CM | POA: Insufficient documentation

## 2013-07-09 DIAGNOSIS — I255 Ischemic cardiomyopathy: Secondary | ICD-10-CM

## 2013-07-09 DIAGNOSIS — I3139 Other pericardial effusion (noninflammatory): Secondary | ICD-10-CM

## 2013-07-09 DIAGNOSIS — Z7902 Long term (current) use of antithrombotics/antiplatelets: Secondary | ICD-10-CM | POA: Insufficient documentation

## 2013-07-09 DIAGNOSIS — E119 Type 2 diabetes mellitus without complications: Secondary | ICD-10-CM | POA: Insufficient documentation

## 2013-07-09 DIAGNOSIS — I2589 Other forms of chronic ischemic heart disease: Secondary | ICD-10-CM

## 2013-07-09 DIAGNOSIS — I10 Essential (primary) hypertension: Secondary | ICD-10-CM | POA: Insufficient documentation

## 2013-07-09 DIAGNOSIS — I519 Heart disease, unspecified: Secondary | ICD-10-CM | POA: Insufficient documentation

## 2013-07-09 DIAGNOSIS — E785 Hyperlipidemia, unspecified: Secondary | ICD-10-CM | POA: Insufficient documentation

## 2013-07-09 DIAGNOSIS — I498 Other specified cardiac arrhythmias: Secondary | ICD-10-CM | POA: Diagnosis present

## 2013-07-09 DIAGNOSIS — I319 Disease of pericardium, unspecified: Secondary | ICD-10-CM

## 2013-07-09 DIAGNOSIS — I214 Non-ST elevation (NSTEMI) myocardial infarction: Secondary | ICD-10-CM | POA: Insufficient documentation

## 2013-07-09 DIAGNOSIS — I313 Pericardial effusion (noninflammatory): Secondary | ICD-10-CM

## 2013-07-09 DIAGNOSIS — Z9861 Coronary angioplasty status: Secondary | ICD-10-CM | POA: Insufficient documentation

## 2013-07-09 DIAGNOSIS — I4891 Unspecified atrial fibrillation: Secondary | ICD-10-CM

## 2013-07-09 DIAGNOSIS — Z981 Arthrodesis status: Secondary | ICD-10-CM | POA: Insufficient documentation

## 2013-07-09 DIAGNOSIS — E7849 Other hyperlipidemia: Secondary | ICD-10-CM | POA: Diagnosis present

## 2013-07-09 DIAGNOSIS — I238 Other current complications following acute myocardial infarction: Secondary | ICD-10-CM | POA: Insufficient documentation

## 2013-07-09 HISTORY — DX: Angina pectoris, unspecified: I20.9

## 2013-07-09 HISTORY — DX: Depression, unspecified: F32.A

## 2013-07-09 HISTORY — DX: Major depressive disorder, single episode, unspecified: F32.9

## 2013-07-09 LAB — COMPREHENSIVE METABOLIC PANEL
ALBUMIN: 3.9 g/dL (ref 3.5–5.2)
ALT: 69 U/L — ABNORMAL HIGH (ref 0–53)
AST: 51 U/L — AB (ref 0–37)
Alkaline Phosphatase: 80 U/L (ref 39–117)
BUN: 21 mg/dL (ref 6–23)
CHLORIDE: 101 meq/L (ref 96–112)
CO2: 23 meq/L (ref 19–32)
CREATININE: 0.89 mg/dL (ref 0.50–1.35)
Calcium: 9.4 mg/dL (ref 8.4–10.5)
GFR calc Af Amer: >90 mL/min (ref >90)
Glucose, Bld: 164 mg/dL — ABNORMAL HIGH (ref 70–99)
POTASSIUM: 4.4 meq/L (ref 3.7–5.3)
Sodium: 140 mEq/L (ref 137–147)
Total Bilirubin: 0.4 mg/dL (ref 0.3–1.2)
Total Protein: 7 g/dL (ref 6.0–8.3)

## 2013-07-09 LAB — PROTIME-INR
INR: 0.95 (ref 0.00–1.49)
PROTHROMBIN TIME: 12.5 s (ref 11.6–15.2)

## 2013-07-09 LAB — GLUCOSE, CAPILLARY
Glucose-Capillary: 117 mg/dL — ABNORMAL HIGH (ref 70–99)
Glucose-Capillary: 161 mg/dL — ABNORMAL HIGH (ref 70–99)

## 2013-07-09 LAB — CBC WITH DIFFERENTIAL/PLATELET
BASOS ABS: 0 10*3/uL (ref 0.0–0.1)
Basophils Relative: 0 % (ref 0–1)
Eosinophils Absolute: 0.2 10*3/uL (ref 0.0–0.7)
Eosinophils Relative: 2 % (ref 0–5)
HCT: 38.7 % — ABNORMAL LOW (ref 39.0–52.0)
Hemoglobin: 13.5 g/dL (ref 13.0–17.0)
Lymphocytes Relative: 19 % (ref 12–46)
Lymphs Abs: 1.3 10*3/uL (ref 0.7–4.0)
MCH: 33.8 pg (ref 26.0–34.0)
MCHC: 34.9 g/dL (ref 30.0–36.0)
MCV: 97 fL (ref 78.0–100.0)
MONO ABS: 0.5 10*3/uL (ref 0.1–1.0)
Monocytes Relative: 7 % (ref 3–12)
Neutro Abs: 5.2 10*3/uL (ref 1.7–7.7)
Neutrophils Relative %: 72 % (ref 43–77)
PLATELETS: 233 10*3/uL (ref 150–400)
RBC: 3.99 MIL/uL — AB (ref 4.22–5.81)
RDW: 13.5 % (ref 11.5–15.5)
WBC: 7.2 10*3/uL (ref 4.0–10.5)

## 2013-07-09 LAB — APTT: aPTT: 25 seconds (ref 24–37)

## 2013-07-09 LAB — SEDIMENTATION RATE: Sed Rate: 38 mm/hr — ABNORMAL HIGH (ref 0–16)

## 2013-07-09 LAB — HEPARIN LEVEL (UNFRACTIONATED): Heparin Unfractionated: 0.11 IU/mL — ABNORMAL LOW (ref 0.30–0.70)

## 2013-07-09 LAB — TROPONIN I

## 2013-07-09 MED ORDER — ACETAMINOPHEN 325 MG PO TABS: 650.0000 mg | ORAL_TABLET | ORAL | Status: DC | PRN

## 2013-07-09 MED ORDER — CYCLOBENZAPRINE HCL 10 MG PO TABS
10.0000 mg | ORAL_TABLET | Freq: Three times a day (TID) | ORAL | Status: DC | PRN
  Filled 2013-07-09: qty 1

## 2013-07-09 MED ORDER — SODIUM CHLORIDE 0.9 % IV SOLN
INTRAVENOUS | Status: DC
Start: 2013-07-10 — End: 2013-07-10
  Administered 2013-07-09 – 2013-07-10 (×2): via INTRAVENOUS

## 2013-07-09 MED ORDER — PNEUMOCOCCAL VAC POLYVALENT 25 MCG/0.5ML IJ INJ
0.5000 mL | INJECTION | INTRAMUSCULAR | Status: DC
Start: 2013-07-10 — End: 2013-07-10
  Filled 2013-07-09: qty 0.5

## 2013-07-09 MED ORDER — GLIMEPIRIDE 2 MG PO TABS
2.0000 mg | ORAL_TABLET | Freq: Two times a day (BID) | ORAL | Status: DC
  Administered 2013-07-09: 2 mg via ORAL
  Filled 2013-07-09 (×4): qty 1

## 2013-07-09 MED ORDER — GABAPENTIN 300 MG PO CAPS: 300.0000 mg | ORAL_CAPSULE | ORAL | Status: DC

## 2013-07-09 MED ORDER — GABAPENTIN 300 MG PO CAPS
900.0000 mg | ORAL_CAPSULE | Freq: Every day | ORAL | Status: DC
  Administered 2013-07-09: 900 mg via ORAL
  Filled 2013-07-09 (×2): qty 3

## 2013-07-09 MED ORDER — SODIUM CHLORIDE 0.9 % IJ SOLN: 3.0000 mL | INTRAMUSCULAR | Status: DC | PRN

## 2013-07-09 MED ORDER — HEPARIN BOLUS VIA INFUSION
4000.0000 [IU] | Freq: Once | INTRAVENOUS | Status: AC
  Administered 2013-07-09: 4000 [IU] via INTRAVENOUS
  Filled 2013-07-09: qty 4000

## 2013-07-09 MED ORDER — COLCHICINE 0.6 MG PO TABS
0.6000 mg | ORAL_TABLET | Freq: Two times a day (BID) | ORAL | Status: DC
  Administered 2013-07-09: 0.6 mg via ORAL
  Filled 2013-07-09 (×4): qty 1

## 2013-07-09 MED ORDER — GABAPENTIN 300 MG PO CAPS
600.0000 mg | ORAL_CAPSULE | ORAL | Status: DC
Start: 2013-07-09 — End: 2013-07-10
  Administered 2013-07-09 – 2013-07-10 (×2): 600 mg via ORAL
  Filled 2013-07-09 (×4): qty 2

## 2013-07-09 MED ORDER — VITAMIN B-12 1000 MCG PO TABS
1000.0000 ug | ORAL_TABLET | Freq: Every day | ORAL | Status: DC
  Filled 2013-07-09 (×2): qty 1

## 2013-07-09 MED ORDER — HEPARIN BOLUS VIA INFUSION
2000.0000 [IU] | Freq: Once | INTRAVENOUS | Status: AC
  Administered 2013-07-09: 2000 [IU] via INTRAVENOUS
  Filled 2013-07-09: qty 2000

## 2013-07-09 MED ORDER — INSULIN ASPART 100 UNIT/ML ~~LOC~~ SOLN: 0.0000 [IU] | Freq: Three times a day (TID) | SUBCUTANEOUS | Status: DC

## 2013-07-09 MED ORDER — ASPIRIN EC 81 MG PO TBEC
81.0000 mg | DELAYED_RELEASE_TABLET | Freq: Every day | ORAL | Status: DC
  Filled 2013-07-09 (×2): qty 1

## 2013-07-09 MED ORDER — CARVEDILOL 6.25 MG PO TABS
6.2500 mg | ORAL_TABLET | Freq: Two times a day (BID) | ORAL | Status: DC
  Administered 2013-07-09 – 2013-07-10 (×2): 6.25 mg via ORAL
  Filled 2013-07-09 (×4): qty 1

## 2013-07-09 MED ORDER — BACLOFEN 10 MG PO TABS
10.0000 mg | ORAL_TABLET | Freq: Three times a day (TID) | ORAL | Status: DC
  Administered 2013-07-09 (×2): 10 mg via ORAL
  Filled 2013-07-09 (×6): qty 1

## 2013-07-09 MED ORDER — BUPROPION HCL ER (XL) 300 MG PO TB24
300.0000 mg | ORAL_TABLET | Freq: Every day | ORAL | Status: DC
  Filled 2013-07-09 (×2): qty 1

## 2013-07-09 MED ORDER — IBUPROFEN 600 MG PO TABS
600.0000 mg | ORAL_TABLET | Freq: Four times a day (QID) | ORAL | Status: DC | PRN
  Filled 2013-07-09: qty 1

## 2013-07-09 MED ORDER — SODIUM CHLORIDE 0.9 % IV SOLN: INTRAVENOUS | Status: DC

## 2013-07-09 MED ORDER — HEPARIN (PORCINE) IN NACL 100-0.45 UNIT/ML-% IJ SOLN
1600.0000 [IU]/h | INTRAMUSCULAR | Status: DC
  Administered 2013-07-10: 1250 [IU]/h via INTRAVENOUS
  Filled 2013-07-09 (×3): qty 250

## 2013-07-09 MED ORDER — NITROGLYCERIN 0.4 MG SL SUBL: 0.4000 mg | SUBLINGUAL_TABLET | SUBLINGUAL | Status: DC | PRN

## 2013-07-09 MED ORDER — FENOFIBRATE 54 MG PO TABS
54.0000 mg | ORAL_TABLET | Freq: Every day | ORAL | Status: DC
  Administered 2013-07-09: 54 mg via ORAL
  Filled 2013-07-09 (×2): qty 1

## 2013-07-09 MED ORDER — TICAGRELOR 90 MG PO TABS
90.0000 mg | ORAL_TABLET | Freq: Two times a day (BID) | ORAL | Status: DC
  Administered 2013-07-09: 90 mg via ORAL
  Filled 2013-07-09 (×4): qty 1

## 2013-07-09 MED ORDER — SODIUM CHLORIDE 0.9 % IV SOLN: 250.0000 mL | INTRAVENOUS | Status: DC | PRN

## 2013-07-09 MED ORDER — ONDANSETRON HCL 4 MG/2ML IJ SOLN
4.0000 mg | Freq: Four times a day (QID) | INTRAMUSCULAR | Status: DC | PRN
Start: 2013-07-09 — End: 2013-07-10

## 2013-07-09 MED ORDER — SODIUM CHLORIDE 0.9 % IJ SOLN
3.0000 mL | Freq: Two times a day (BID) | INTRAMUSCULAR | Status: DC
  Administered 2013-07-09: 3 mL via INTRAVENOUS

## 2013-07-09 MED ORDER — HEPARIN (PORCINE) IN NACL 100-0.45 UNIT/ML-% IJ SOLN
1000.0000 [IU]/h | INTRAMUSCULAR | Status: DC
  Administered 2013-07-09: 1000 [IU]/h via INTRAVENOUS
  Filled 2013-07-09: qty 250

## 2013-07-09 MED ORDER — ATORVASTATIN CALCIUM 80 MG PO TABS
80.0000 mg | ORAL_TABLET | Freq: Every day | ORAL | Status: DC
  Administered 2013-07-09: 80 mg via ORAL
  Filled 2013-07-09 (×2): qty 1

## 2013-07-09 NOTE — Progress Notes (Signed)
[place], [place], [place]  [address] Phone: [phone] Fax:  [phone]  Date:  07/09/2013   ID:  Kristopher [name], DOB 06/05/1951, MRN [id number]  PCP:  No primary provider on file.  Cardiologist:  Dr. Shelva Majestic     History of Present Illness: Kristopher [name] is a 63 y.o. male with a hx of HTN, T2DM, AFib s/p ablation, chronic pericardial effusion.  He was admitted 1/8-1/10 with a NSTEMI.  Chest CTA was neg for pulmonary embolism. He did have a moderate effusion on CT without signs of tamponade.  LHC (06/27/2013):  pLAD 10-20, RCA 90, EF 45-50%, inf HK.  PCI:  Xience Alpine (3 x 23 mm) DES to the RCA.  Echo (06/28/2011): Inferior and inferoseptal akinesis, EF 93%, grade 2 diastolic dysfunction, MAC, small effusion (no tamponade).  Post PCI course uneventful.  ACEI could not be added due to soft BP prior to D/C.    He did well after d/c for about a week.  He then started to develop substernal chest heaviness similar to his prior angina (not as bad as MI) that would occur with exertion and at rest with associated dyspnea, L arm pain and diaphoresis.  He can rest with improvement in symptoms.  He takes NTG with relief.  He does not think his symptoms are any worse.  He is having pain at rest in the office today.    Recent Labs: 06/27/2013: HDL Cholesterol 31*; LDL (calc) 119*; TSH 2.927  06/28/2013: Creatinine 0.84; Hemoglobin 11.7*; Potassium 4.1   Wt Readings from Last 3 Encounters:  06/27/13 188 lb 4.4 oz (85.4 kg)  06/27/13 188 lb 4.4 oz (85.4 kg)     Past Medical History  Diagnosis Date  . Diabetes mellitus without complication   . Herniated cervical disc     c2-T1 fusion  . Chronic pain     back pain  . Atrial fibrillation     s/p PVI X2 by Dr Omelia Blackwater at [facility] around 2000  . NSTEMI (non-ST elevated myocardial infarction) 06-27-2013  . CAD (coronary artery disease)     s/p DES to RCA 06-27-13    Current Outpatient Prescriptions  Medication Sig Dispense Refill  .  aspirin EC 81 MG tablet Take 1 tablet (81 mg total) by mouth daily.  30 tablet  0  . atorvastatin (LIPITOR) 80 MG tablet Take 1 tablet (80 mg total) by mouth daily.  30 tablet  11  . baclofen (LIORESAL) 10 MG tablet Take 10 mg by mouth 3 (three) times daily.      Marland Kitchen buPROPion (WELLBUTRIN XL) 300 MG 24 hr tablet Take 300 mg by mouth daily.      . cholecalciferol (VITAMIN D) 1000 UNITS tablet Take 1,000 Units by mouth daily.      . Cinnamon 500 MG capsule Take 1,000 mg by mouth 2 (two) times daily.      . colchicine 0.6 MG tablet Take 1 tablet (0.6 mg total) by mouth 2 (two) times daily.  60 tablet  3  . cyclobenzaprine (FLEXERIL) 10 MG tablet Take 10 mg by mouth 3 (three) times daily as needed for muscle spasms.      . fenofibrate 54 MG tablet Take 54 mg by mouth daily.      Marland Kitchen gabapentin (NEURONTIN) 300 MG capsule Take 600-900 mg by mouth 3 (three) times daily. Take 2 capsules (650m) twice a day Take 3 capsules (9049m at bedtime      .  glimepiride (AMARYL) 2 MG tablet Take 2 mg by mouth daily with breakfast.      . ibuprofen (ADVIL,MOTRIN) 200 MG tablet Take 600 mg by mouth every 6 (six) hours as needed for moderate pain.      . metoprolol succinate (TOPROL XL) 25 MG 24 hr tablet Take 1 tablet (25 mg total) by mouth daily.  30 tablet  11  . nitroGLYCERIN (NITROSTAT) 0.4 MG SL tablet Place 1 tablet (0.4 mg total) under the tongue every 5 (five) minutes as needed for chest pain.  25 tablet  3  . sitaGLIPtin-metformin (JANUMET) 50-500 MG per tablet Take 1 tablet by mouth 2 (two) times daily with a meal. Hold for 48 hours, restart on 06/30/2013.      . Ticagrelor (BRILINTA) 90 MG TABS tablet Take 1 tablet (90 mg total) by mouth 2 (two) times daily.  60 tablet  11  . vitamin B-12 (CYANOCOBALAMIN) 1000 MCG tablet Take 1,000 mcg by mouth daily.       No current facility-administered medications for this visit.    Allergies:   Codeine and Other   Social History:  The patient  reports that he has never  smoked. He has never used smokeless tobacco. He reports that he drinks alcohol. He reports that he does not use illicit drugs.   Family History:  The patient's family history is not on file.   ROS:  Please see the history of present illness.   He has a non-productive cough.  No fevers, melena, hematochezia.   All other systems reviewed and negative.   PHYSICAL EXAM: VS:  BP 112/74  Pulse 97  Ht 5' 7"  (1.702 m)  Wt 181 lb (82.101 kg)  BMI 28.34 kg/m2 Well nourished, well developed, in no acute distress HEENT: normal Neck: no JVD Vascular:  No carotid bruits Cardiac:  normal S1, S2; RRR; no murmurno rub Lungs:  clear to auscultation bilaterally, no wheezing, rhonchi or rales Abd: soft, nontender, no hepatomegaly Ext: no edemaright groin without hematoma or bruit  Skin: warm and dry Neuro:  CNs 2-12 intact, no focal abnormalities noted  EKG:  NSR, HR 97, normal axis, inf Q waves, NSSTTW changes     ASSESSMENT AND PLAN:  1. Unstable Angina:  He has had a recurrence of his prior angina.  He had 1v CAD at his cath 2 weeks ago.  RCA was treated with DES and he did well without chest pain until last week.  His pain is relieved by NTG.  ECG is non-acute.  He is having pain in the office today.  I have recommended admission to the hospital and plan on relook cath.  Discussed with Dr. Jenkins Rouge (DOD) who agreed and also saw the patient.  Risks and benefits of cardiac catheterization have been discussed with the patient.  These include bleeding, infection, kidney damage, stroke, heart attack, death.  The patient understands these risks and is willing to proceed.  Start IV Heparin.  Check serial cardiac markers.  Change Toprol to Coreg 6.25 bid.   2. CAD, s/p Prior MI:  As noted, he is having recurrent angina.  Admit for observation for cardiac cath as noted.  Continue ASA, Brilinta, statin.  3. Ischemic Cardiomyopathy:  Adjust Metoprolol to Coreg.  Try to add low dose ACEI if BP allows.   4. Pericardial Effusion:  Mild to mod during last admission.  If cath shows RCA stent patent and no other obstructive disease, consider repeat echo.   5. Hypertension:  Controlled.  6. Hyperlipidemia:  Continue statin. 7. Diabetes Mellitus:  Hold Metformin for cath.   8. Disposition:  Admit for observation to [facility] today and plan cath tomorrow.   Signed, Richardson Dopp, PA-C  07/09/2013 10:49 AM    Addendum:  Patient given NTG in office x 1 for chest pain. He then developed nausea, diaphoresis. BP was low at 60 palp. IVFs started. Repeat ECG without STE. EMS was called and he was sent to [facility] emergently. Will have DOD see patient upon arrival to recheck. If continued symptoms, may need emergent cath today.  Signed,  Richardson Dopp, PA-C  07/09/2013 1:13 PM

## 2013-07-09 NOTE — Progress Notes (Signed)
ANTICOAGULATION CONSULT NOTE - Follow Up Consult   HL = 0.11 (goal 0.3 - 0.7 units/mL) Heparin dosing weight = 82 kg   Assessment: 62 YOM continues on IV heparin for chest pain.  Heparin level sub-therapeutic.  No issues per RN.  Cath tomorrow.   Plan: - Heparin 2000 units IV bolus x 1, then - Increase heparin gtt to 1250 units/hr - Check 6 hr HL    Othel Dicostanzo D. Mina Marble, PharmD, BCPS Pager:  [phone] 07/09/2013, 9:19 PM

## 2013-07-09 NOTE — Patient Instructions (Signed)
DIRECT ADMIT TO [facility] 3 WEST ROOM 3; ADMITTED PER DR. Johnsie Cancel AND SCOTT WEAVER, [facility]  LEFT HEART CATH 07/10/13

## 2013-07-09 NOTE — Progress Notes (Signed)
    S:  Pt arrived from the office, where he did have an episode of c/p requiring ntg with subsequent diaphoresis and hypotension.  He is currently without chest pain or dyspnea and is lying comfortable in bed.  O:   Filed Vitals:   07/09/13 1417  BP: 111/68  Pulse: 89  Temp: 98.1 F (36.7 C)  Resp: 18   Pleasant, nad, aaox3.  Lungs cta, cor rrr.  A/P:  1.  [place]/CAD:  Pt presented to the office today with [place] and developed c/p in the office.  This has since resolved and he is hemodynamically stable.  Heparin gtt to be started.  Plan cath for tomorrow as previously outlined.  Murray Hodgkins, NP

## 2013-07-09 NOTE — H&P (Signed)
History and Physical   Date:  07/09/2013   ID:  Kristopher [name], DOB Jul 01, 1950, MRN [id number]  PCP:  No primary provider on file.  Cardiologist:  Dr. Shelva Majestic     History of Present Illness: Kristopher [name] is a 63 y.o. male with a hx of HTN, T2DM, AFib s/p ablation, chronic pericardial effusion.  He was admitted 1/8-1/10 with a NSTEMI.  Chest CTA was neg for pulmonary embolism. He did have a moderate effusion on CT without signs of tamponade.  LHC (06/27/2013):  pLAD 10-20, RCA 90, EF 45-50%, inf HK.  PCI:  Xience Alpine (3 x 23 mm) DES to the RCA.  Echo (06/28/2011): Inferior and inferoseptal akinesis, EF 40%, grade 2 diastolic dysfunction, MAC, small effusion (no tamponade).  Post PCI course uneventful.  ACEI could not be added due to soft BP prior to D/C.    He did well after d/c for about a week.  He then started to develop substernal chest heaviness similar to his prior angina (not as bad as MI) that would occur with exertion and at rest with associated dyspnea, L arm pain and diaphoresis.  He can rest with improvement in symptoms.  He takes NTG with relief.  He does not think his symptoms are any worse.  He is having pain at rest in the office today.     Recent Labs: 06/27/2013: HDL Cholesterol 31*; LDL (calc) 119*; TSH 2.927  06/28/2013: Creatinine 0.84; Hemoglobin 11.7*; Potassium 4.1   Wt Readings from Last 3 Encounters:  06/27/13 188 lb 4.4 oz (85.4 kg)  06/27/13 188 lb 4.4 oz (85.4 kg)     Past Medical History  Diagnosis Date  . Diabetes mellitus without complication   . Herniated cervical disc     c2-T1 fusion  . Chronic pain     back pain  . Atrial fibrillation     s/p PVI X2 by Dr Omelia Blackwater at [facility] around 2000  . NSTEMI (non-ST elevated myocardial infarction) 06-27-2013  . CAD (coronary artery disease)     s/p DES to RCA 06-27-13    Past Surgical History  Procedure Laterality Date  . Back surgery      fusion c3-t1  . Ablation  2000    PVI X2 by Dr Omelia Blackwater at [facility]  . aspirin EC 81 MG tablet Take 1 tablet (81 mg total) by mouth daily.  30 tablet  0  . atorvastatin (LIPITOR) 80 MG tablet Take 1 tablet (80 mg total) by mouth daily.  30 tablet  11  . baclofen (LIORESAL) 10 MG tablet Take 10 mg by mouth 3 (three) times daily.      Marland Kitchen buPROPion (WELLBUTRIN XL) 300 MG 24 hr tablet Take 300 mg by mouth daily.      . cholecalciferol (VITAMIN D) 1000 UNITS tablet Take 1,000 Units by mouth daily.      . Cinnamon 500 MG capsule Take 1,000 mg by mouth 2 (two) times daily.      . colchicine 0.6 MG tablet Take 1 tablet (0.6 mg total) by mouth 2 (two) times daily.  60 tablet  3  . cyclobenzaprine (FLEXERIL) 10 MG tablet Take 10 mg by mouth 3 (three) times daily as needed for muscle spasms.      . fenofibrate 54 MG tablet Take 54 mg by mouth daily.      Marland Kitchen gabapentin (NEURONTIN) 300 MG capsule Take 600-900 mg  by mouth 3 (three) times daily. Take 2 capsules (662m) twice a day Take 3 capsules (9058m at bedtime      . glimepiride (AMARYL) 2 MG tablet Take 2 mg by mouth daily with breakfast.      . ibuprofen (ADVIL,MOTRIN) 200 MG tablet Take 600 mg by mouth every 6 (six) hours as needed for moderate pain.      . metoprolol succinate (TOPROL XL) 25 MG 24 hr tablet Take 1 tablet (25 mg total) by mouth daily.  30 tablet  11  . nitroGLYCERIN (NITROSTAT) 0.4 MG SL tablet Place 1 tablet (0.4 mg total) under the tongue every 5 (five) minutes as needed for chest pain.  25 tablet  3  . sitaGLIPtin-metformin (JANUMET) 50-500 MG per tablet Take 1 tablet by mouth 2 (two) times daily with a meal. Hold for 48 hours, restart on 06/30/2013.      . Ticagrelor (BRILINTA) 90 MG TABS tablet Take 1 tablet (90 mg total) by mouth 2 (two) times daily.  60 tablet  11  . vitamin B-12 (CYANOCOBALAMIN) 1000 MCG tablet Take 1,000 mcg by mouth daily.       No current facility-administered medications for this visit.    Allergies:    Codeine and Other   Social History:  The patient  reports that he has never smoked. He has never used smokeless tobacco. He reports that he drinks alcohol. He reports that he does not use illicit drugs.   Family History:  The patient's family history is not on file.   ROS:  Please see the history of present illness.   He has a non-productive cough.  No fevers, melena, hematochezia.   All other systems reviewed and negative.   PHYSICAL EXAM: VS:  BP 112/74  Pulse 97  Ht 5' 7"  (1.702 m)  Wt 181 lb (82.101 kg)  BMI 28.34 kg/m2 Well nourished, well developed, in no acute distress HEENT: normal Neck: no JVD Vascular:  No carotid bruits Cardiac:  normal S1, S2; RRR; no murmurno rub Lungs:  clear to auscultation bilaterally, no wheezing, rhonchi or rales Abd: soft, nontender, no hepatomegaly Ext: no edemaright groin without hematoma or bruit  Skin: warm and dry Neuro:  CNs 2-12 intact, no focal abnormalities noted  EKG:  NSR, HR 97, normal axis, inf Q waves, NSSTTW changes  Post diaphoresis and pain SR no acute ST changes stable    ASSESSMENT AND PLAN:  1. Unstable Angina:  He has had a recurrence of his prior angina.  He had 1v CAD at his cath 2 weeks ago.  RCA was treated with DES and he did well without chest pain until last week.  His pain is relieved by NTG.  ECG is non-acute.  He is having pain in the office today.  I have recommended admission to the hospital and plan on relook cath.  Discussed with Dr. PeJenkins RougeDOD) who agreed and also saw the patient.  Risks and benefits of cardiac catheterization have been discussed with the patient.  These include bleeding, infection, kidney damage, stroke, heart attack, death.  The patient understands these risks and is willing to proceed.  Start IV Heparin.  Check serial cardiac markers.  Change Toprol to Coreg 6.25 bid.   2. CAD, s/p Prior MI:  As noted, he is having recurrent angina.  Admit for observation for cardiac cath as noted.   Continue ASA, Brilinta, statin.  3. Ischemic Cardiomyopathy:  Adjust Metoprolol to Coreg.  Try to add low  dose ACEI if BP allows.  4. Pericardial Effusion:  Mild to mod during last admission.  If cath shows RCA stent patent and no other obstructive disease, consider repeat echo.   5. Hypertension:  Controlled.  6. Hyperlipidemia:  Continue statin. 7. Diabetes Mellitus:  Hold Metformin for cath.   8. Disposition:  Admit for observation to [facility] today and plan cath tomorrow.   Jenkins Rouge   Addendum: Patient given NTG in office x 1 for chest pain.  He then developed nausea, diaphoresis.  BP was low at 60 palp.  IVFs started.  Repeat ECG without STE.  EMS was called and he was sent to [facility] emergently.  Will have DOD see patient upon arrival to recheck.  If continued symptoms, may need emergent cath today.  Jenkins Rouge

## 2013-07-09 NOTE — Consult Note (Signed)
ANTICOAGULATION CONSULT NOTE - Initial Consult  Pharmacy Consult for Heparin Indication: chest pain/ACS  Allergies  Allergen Reactions  . Codeine Nausea And Vomiting  . Other Nausea And Vomiting    The patient states that he has had  a reaction to all narcotics. He can take them though.    Patient Measurements: Height: 5' 6.93" (170 cm) Weight: 181 lb (82.1 kg) IBW/kg (Calculated) : 65.94 Heparin Dosing Weight: 82kg  Vital Signs: BP: 112/74 mmHg (01/21 1106) Pulse Rate: 97 (01/21 1106)  Labs: No results found for this basename: HGB, HCT, PLT, APTT, LABPROT, INR, HEPARINUNFRC, CREATININE, CKTOTAL, CKMB, TROPONINI,  in the last 72 hours  Estimated Creatinine Clearance: 95.8 ml/min (by C-G formula based on Cr of 0.84).   Medical History: Past Medical History  Diagnosis Date  . Diabetes mellitus without complication   . Herniated cervical disc     c2-T1 fusion  . Chronic pain     back pain  . Atrial fibrillation     s/p PVI X2 by Dr Omelia Blackwater at [facility] around 2000  . NSTEMI (non-ST elevated myocardial infarction) 06-27-2013  . CAD (coronary artery disease)     a. NSTEMI (06/2013):  LHC (06/27/2013):  pLAD 10-20, RCA 90, EF 45-50%, inf HK.  PCI:  Xience Alpine (3 x 23 mm) DES to the RCA.  . Ischemic cardiomyopathy     a. Echo (06/28/2011): Inferior and inferoseptal akinesis, EF 21%, grade 2 diastolic dysfunction, MAC, small effusion (no tamponade)  . HTN (hypertension)   . HLD (hyperlipidemia)   . Pericardial effusion     chronic since hx of pericarditis in 2004    Medications:  No anticoagulants pta  Assessment: 62yom recently discharged after admission for inferior MI s/p DES to the RCA. He returned to the office today with chest pain at rest. He will be admitted, start IV heparin, and may need emergent cath today. No labs have been drawn yet, but labs from a week ago were wnl.   Goal of Therapy:  Heparin level 0.3-0.7 units/ml Monitor platelets by anticoagulation protocol:  Yes   Plan:  1) Heparin bolus 4000 units x 1 2) Heparin drip at 1000 units/hr 3) 6 hour heparin level vs follow up after cath  Deboraha Sprang 07/09/2013,1:44 PM

## 2013-07-10 ENCOUNTER — Ambulatory Visit (HOSPITAL_COMMUNITY): Payer: 59

## 2013-07-10 ENCOUNTER — Ambulatory Visit (HOSPITAL_COMMUNITY): Admission: RE | Admit: 2013-07-10 | Payer: 59 | Source: Ambulatory Visit | Admitting: Cardiovascular Disease

## 2013-07-10 ENCOUNTER — Encounter (HOSPITAL_COMMUNITY)
Admission: AD | Disposition: A | Discharge status: Home / Self Care | Payer: Self-pay | Source: Ambulatory Visit | Attending: Cardiovascular Disease

## 2013-07-10 DIAGNOSIS — I2510 Atherosclerotic heart disease of native coronary artery without angina pectoris: Secondary | ICD-10-CM

## 2013-07-10 HISTORY — PX: LEFT HEART CATHETERIZATION WITH CORONARY ANGIOGRAM: SHX5451

## 2013-07-10 LAB — CBC
HEMATOCRIT: 36 % — AB (ref 39.0–52.0)
Hemoglobin: 12.6 g/dL — ABNORMAL LOW (ref 13.0–17.0)
MCH: 33.5 pg (ref 26.0–34.0)
MCHC: 35 g/dL (ref 30.0–36.0)
MCV: 95.7 fL (ref 78.0–100.0)
Platelets: 183 10*3/uL (ref 150–400)
RBC: 3.76 MIL/uL — ABNORMAL LOW (ref 4.22–5.81)
RDW: 13.5 % (ref 11.5–15.5)
WBC: 5.2 10*3/uL (ref 4.0–10.5)

## 2013-07-10 LAB — BASIC METABOLIC PANEL
BUN: 19 mg/dL (ref 6–23)
CALCIUM: 9.1 mg/dL (ref 8.4–10.5)
CO2: 27 meq/L (ref 19–32)
CREATININE: 0.91 mg/dL (ref 0.50–1.35)
Chloride: 104 mEq/L (ref 96–112)
GFR calc Af Amer: >90 mL/min (ref >90)
GFR calc non Af Amer: >90 mL/min (ref >90)
GLUCOSE: 168 mg/dL — AB (ref 70–99)
Potassium: 4.2 mEq/L (ref 3.7–5.3)
Sodium: 144 mEq/L (ref 137–147)

## 2013-07-10 LAB — GLUCOSE, CAPILLARY
Glucose-Capillary: 156 mg/dL — ABNORMAL HIGH (ref 70–99)
Glucose-Capillary: 147 mg/dL — ABNORMAL HIGH (ref 70–99)

## 2013-07-10 LAB — TROPONIN I

## 2013-07-10 LAB — HEPARIN LEVEL (UNFRACTIONATED): Heparin Unfractionated: 0.14 IU/mL — ABNORMAL LOW (ref 0.30–0.70)

## 2013-07-10 SURGERY — LEFT HEART CATHETERIZATION WITH CORONARY ANGIOGRAM
Anesthesia: LOCAL

## 2013-07-10 MED ORDER — ISOSORBIDE MONONITRATE ER 30 MG PO TB24: 30.0000 mg | ORAL_TABLET | Freq: Every day | ORAL | Status: DC

## 2013-07-10 MED ORDER — MIDAZOLAM HCL 2 MG/2ML IJ SOLN
INTRAMUSCULAR | Status: AC
  Filled 2013-07-10: qty 2

## 2013-07-10 MED ORDER — HEPARIN SODIUM (PORCINE) 1000 UNIT/ML IJ SOLN
INTRAMUSCULAR | Status: AC
Start: 2013-07-10 — End: 2013-07-10
  Filled 2013-07-10: qty 1

## 2013-07-10 MED ORDER — HEPARIN BOLUS VIA INFUSION
2000.0000 [IU] | Freq: Once | INTRAVENOUS | Status: AC
  Administered 2013-07-10: 2000 [IU] via INTRAVENOUS
  Filled 2013-07-10: qty 2000

## 2013-07-10 MED ORDER — ISOSORBIDE MONONITRATE ER 30 MG PO TB24
30.0000 mg | ORAL_TABLET | Freq: Every day | ORAL | Status: DC
  Filled 2013-07-10: qty 1

## 2013-07-10 MED ORDER — NITROGLYCERIN 0.2 MG/ML ON CALL CATH LAB
INTRAVENOUS | Status: AC
  Filled 2013-07-10: qty 1

## 2013-07-10 MED ORDER — SITAGLIPTIN PHOS-METFORMIN HCL 50-500 MG PO TABS: 1.0000 | ORAL_TABLET | Freq: Two times a day (BID) | ORAL | Status: DC

## 2013-07-10 MED ORDER — SODIUM CHLORIDE 0.9 % IV SOLN: INTRAVENOUS | Status: DC

## 2013-07-10 MED ORDER — VERAPAMIL HCL 2.5 MG/ML IV SOLN
INTRAVENOUS | Status: AC
  Filled 2013-07-10: qty 2

## 2013-07-10 MED ORDER — LIDOCAINE HCL (PF) 1 % IJ SOLN
INTRAMUSCULAR | Status: AC
  Filled 2013-07-10: qty 30

## 2013-07-10 MED ORDER — HEPARIN (PORCINE) IN NACL 2-0.9 UNIT/ML-% IJ SOLN
INTRAMUSCULAR | Status: AC
  Filled 2013-07-10: qty 1000

## 2013-07-10 NOTE — Progress Notes (Signed)
Pt provided with dc instructions and education. Pt verbalized understanding. Pt has no questinos at this time. IV removed with tip intact. Heart monitor cleaned and returned to frotn. Dorna Bloom, RN

## 2013-07-10 NOTE — Progress Notes (Signed)
ANTICOAGULATION CONSULT NOTE - Follow Up Consult  Pharmacy Consult for heparin Indication: USAP  Labs:  Recent Labs  07/09/13 1355 07/09/13 1917 07/09/13 2025 07/10/13 0153 07/10/13 0625  HGB 13.5  --   --   --  12.6*  HCT 38.7*  --   --   --  36.0*  PLT 233  --   --   --  183  APTT 25  --   --   --   --   LABPROT 12.5  --   --   --   --   INR 0.95  --   --   --   --   HEPARINUNFRC  --   --  0.11*  --  0.14*  CREATININE 0.89  --   --   --  0.91  TROPONINI <0.30 <0.30  --  <0.30  --     Assessment: 63yo male remains subtherapeutic on heparin with little movement in level despite rate increase.  Goal of Therapy:  Heparin level 0.3-0.7 units/ml   Plan:  Will rebolus with heparin 2000 units x1 and increase heparin gtt by 4-5 units/kg/hr to 1600 units/hr and f/u after cath.  Wynona Neat, PharmD, BCPS  07/10/2013,7:33 AM

## 2013-07-10 NOTE — CV Procedure (Signed)
      Cardiac Catheterization Operative Report  Kristopher [name] [id number] 1/22/201510:40 AM No primary provider on file.  Procedure Performed:  1. Left Heart Catheterization 2. Selective Coronary Angiography 3. Left ventricular angiogram  Operator: Lauree Chandler, MD  Arterial access site:  Right radial artery.   Indication:  63 yo male with history of PAF, DM and recently diagnosed CAD with presentation earlier this month with NSTEMI. Dr Claiborne Billings performed cath 06/27/13. Pt found to have severe stenosis distal RCA. A DES was placed in the distal RCA. Pt with recent recurrence of chest pain at home over last week, using NTG. Admitted from office 07/09/13 with chest pain. Cardiac markers negative.                                    Procedure Details: The risks, benefits, complications, treatment options, and expected outcomes were discussed with the patient. The patient and/or family concurred with the proposed plan, giving informed consent. The patient was brought to the cath lab after IV hydration was begun and oral premedication was given. The patient was further sedated with Versed and Fentanyl. The right wrist was assessed with an Allens test which was positive. The right wrist was prepped and draped in a sterile fashion. 1% lidocaine was used for local anesthesia. Using the modified Seldinger access technique, a 5 French sheath was placed in the right radial artery. 3 mg Verapamil was given through the sheath. 4000 units IV heparin was given. Standard diagnostic catheters were used to perform selective coronary angiography. A pigtail catheter was used to perform a left ventricular angiogram. The sheath was removed from the right radial artery and a Terumo hemostasis band was applied at the arteriotomy site on the right wrist.   There were no immediate complications. The patient was taken to the recovery area in stable condition.   Hemodynamic Findings: Central aortic pressure:  96/61 Left ventricular pressure: 93/13/20  Angiographic Findings:  Left main: No obstructive disease.   Left Anterior Descending Artery: Large caliber vessel that courses to the apex. There is diffuse 20% stenosis in the proximal and mid vessel. The distal vessel becomes small in caliber. The diagonal branch is moderate in caliber with no obstructive disease.   Circumflex Artery: Large caliber vessel with three small to moderate caliber obtuse marginal branches. There is mild plaque in the second and third obtuse marginal branches.   Right Coronary Artery: Large dominant vessel with calcification noted in the mid vessel. The proximal vessel has mild plaque. There is a 30% mid stenosis in the calcified segment. The distal stent is patent without restenosis. There is mild diffuse plaque beyond the stent and in the PDA. No obstructive lesions are seen.   Left Ventricular Angiogram: LVEF=40%.   Impression: 1. Single vessel CAD with patent stent RCA 2. Moderate LV systolic dysfunction  Recommendations: Continue medical management of CAD and ischemic cardiomyopathy. Will add Imdur with possible microvascular disease/angina. Discharge later today. Follow up with Dr. Claiborne Billings in one week.        Complications:  None. The patient tolerated the procedure well.

## 2013-07-10 NOTE — Discharge Summary (Signed)
See full cath note and rounding note. cdm

## 2013-07-10 NOTE — Discharge Instructions (Signed)
PLEASE REMEMBER TO BRING ALL OF YOUR MEDICATIONS TO EACH OF YOUR FOLLOW-UP OFFICE VISITS. ° °PLEASE ATTEND ALL SCHEDULED FOLLOW-UP APPOINTMENTS.  ° °Activity: Increase activity slowly as tolerated. You may shower, but no soaking baths (or swimming) for 1 week. No driving for 2 days. No lifting over 5 lbs for 1 week. No sexual activity for 1 week.  ° °You May Return to Work: in 1 week (if applicable) ° °Wound Care: You may wash cath site gently with soap and water. Keep cath site clean and dry. If you notice pain, swelling, bleeding or pus at your cath site, please call [phone]. ° ° ° °Cardiac Cath Site Care °Refer to this sheet in the next few weeks. These instructions provide you with information on caring for yourself after your procedure. Your caregiver may also give you more specific instructions. Your treatment has been planned according to current medical practices, but problems sometimes occur. Call your caregiver if you have any problems or questions after your procedure. °HOME CARE INSTRUCTIONS °· You may shower 24 hours after the procedure. Remove the bandage (dressing) and gently wash the site with plain soap and water. Gently pat the site dry.  °· Do not apply powder or lotion to the site.  °· Do not sit in a bathtub, swimming pool, or whirlpool for 5 to 7 days.  °· No bending, squatting, or lifting anything over 10 pounds (4.5 kg) as directed by your caregiver.  °· Inspect the site at least twice daily.  °· Do not drive home if you are discharged the same day of the procedure. Have someone else drive you.  °· You may drive 24 hours after the procedure unless otherwise instructed by your caregiver.  °What to expect: °· Any bruising will usually fade within 1 to 2 weeks.  °· Blood that collects in the tissue (hematoma) may be painful to the touch. It should usually decrease in size and tenderness within 1 to 2 weeks.  °SEEK IMMEDIATE MEDICAL CARE IF: °· You have unusual pain at the site or down the  affected limb.  °· You have redness, warmth, swelling, or pain at the site.  °· You have drainage (other than a small amount of blood on the dressing).  °· You have chills.  °· You have a fever or persistent symptoms for more than 72 hours.  °· You have a fever and your symptoms suddenly get worse.  °· Your leg becomes pale, cool, tingly, or numb.  °· You have heavy bleeding from the site. Hold pressure on the site.  °Document Released: 07/08/2010 Document Revised: 05/25/2011 Document Reviewed: 07/08/2010 °Exit[employer]® Patient Information ©2012 [employer]. ° °

## 2013-07-10 NOTE — Progress Notes (Signed)
Radial band removed and pressure dressing applied. No complications at this time. Dorna Bloom, RN

## 2013-07-10 NOTE — Interval H&P Note (Signed)
History and Physical Interval Note:  07/10/2013 10:02 AM  Kristopher [name]  has presented today for cardiac cath with the diagnosis of Chest pain/recent stent RCA.  The various methods of treatment have been discussed with the patient and family. After consideration of risks, benefits and other options for treatment, the patient has consented to  Procedure(s): LEFT HEART CATHETERIZATION WITH CORONARY ANGIOGRAM (N/A) as a surgical intervention .  The patient's history has been reviewed, patient examined, no change in status, stable for surgery.  I have reviewed the patient's chart and labs.  Questions were answered to the patient's satisfaction.    Cath Lab Visit (complete for each Cath Lab visit)  Clinical Evaluation Leading to the Procedure:   ACS: no  Non-ACS:    Anginal Classification: CCS III  Anti-ischemic medical therapy: Minimal Therapy (1 class of medications)  Non-Invasive Test Results: No non-invasive testing performed  Prior CABG: No previous CABG        Kristopher [name]

## 2013-07-10 NOTE — Progress Notes (Signed)
Subjective: No chest pain, no SOB.  Objective: Vital signs in last 24 hours: Temp:  [97.4 F (36.3 C)-98.1 F (36.7 C)] 97.4 F (36.3 C) (01/22 0448) Pulse Rate:  [73-97] 85 (01/22 0448) Resp:  [18] 18 (01/22 0448) BP: (104-112)/(64-74) 111/65 mmHg (01/22 0448) SpO2:  [98 %-99 %] 98 % (01/22 0448) Weight:  [181 lb (82.1 kg)-181 lb (82.101 kg)] 181 lb (82.1 kg) (01/21 1300) Weight change:  Last BM Date: 07/08/13 Intake/Output from previous day: not documented   Intake/Output this shift:    PE: General:Pleasant affect, NAD Skin:Warm and dry, brisk capillary refill HEENT:normocephalic, sclera clear, mucus membranes moist Heart:S1S2 RRR without murmur, gallup, rub or click Lungs: with few rales Rt base, no rhonchi, or wheezes XNT:ZGYF, non tender, + BS, do not palpate liver spleen or masses Ext:no lower ext edema, 2+ pedal pulses, 2+ radial pulses Neuro:alert and oriented, MAE, follows commands, + facial symmetry   Lab Results:  Recent Labs  07/09/13 1355 07/10/13 0625  WBC 7.2 5.2  HGB 13.5 12.6*  HCT 38.7* 36.0*  PLT 233 183   BMET  Recent Labs  07/09/13 1355 07/10/13 0625  NA 140 144  K 4.4 4.2  CL 101 104  CO2 23 27  GLUCOSE 164* 168*  BUN 21 19  CREATININE 0.89 0.91  CALCIUM 9.4 9.1    Recent Labs  07/09/13 1917 07/10/13 0153  TROPONINI <0.30 <0.30    Lab Results  Component Value Date   CHOL 175 06/27/2013   HDL 31* 06/27/2013   LDLCALC 119* 06/27/2013   TRIG 126 06/27/2013   CHOLHDL 5.6 06/27/2013   No results found for this basename: HGBA1C     Lab Results  Component Value Date   TSH 2.927 06/27/2013    Hepatic Function Panel  Recent Labs  07/09/13 1355  PROT 7.0  ALBUMIN 3.9  AST 51*  ALT 69*  ALKPHOS 80  BILITOT 0.4   No results found for this basename: CHOL,  in the last 72 hours No results found for this basename: PROTIME,  in the last 72 hours  EKG without acute changes.   Studies/Results: No results  found.  Medications: I have reviewed the patient's current medications. Scheduled Meds: . aspirin EC  81 mg Oral Daily  . atorvastatin  80 mg Oral q1800  . baclofen  10 mg Oral TID  . buPROPion  300 mg Oral Daily  . carvedilol  6.25 mg Oral BID WC  . colchicine  0.6 mg Oral BID  . fenofibrate  54 mg Oral Daily  . gabapentin  600 mg Oral Custom  . gabapentin  900 mg Oral QHS  . glimepiride  2 mg Oral BID AC  . insulin aspart  0-9 Units Subcutaneous TID WC  . pneumococcal 23 valent vaccine  0.5 mL Intramuscular Tomorrow-1000  . sodium chloride  3 mL Intravenous Q12H  . Ticagrelor  90 mg Oral BID  . vitamin B-12  1,000 mcg Oral Daily   Continuous Infusions: . sodium chloride 50 mL/hr at 07/09/13 1424  . sodium chloride 75 mL/hr at 07/10/13 0320  . heparin 1,600 Units/hr (07/10/13 0732)   PRN Meds:.sodium chloride, acetaminophen, cyclobenzaprine, ibuprofen, nitroGLYCERIN, ondansetron (ZOFRAN) IV, sodium chloride  Assessment/Plan: Active Problems:   CAD (coronary artery disease)   Ischemic cardiomyopathy   HTN (hypertension)   HLD (hyperlipidemia)   Pericardial effusion   Atrial fibrillation s/p PVI ablation at [facility] 2001   Unstable angina  PLAN: Kristopher [name] is a 63 y.o. male with a hx of HTN, T2DM, AFib s/p ablation, chronic pericardial effusion. He was admitted 1/8-1/10 with a NSTEMI. Chest CTA was neg for pulmonary embolism. He did have a moderate effusion on CT without signs of tamponade. LHC (06/27/2013): pLAD 10-20, RCA 90, EF 45-50%, inf HK. PCI: Xience Alpine (3 x 23 mm) DES to the RCA. Echo (06/28/2011): Inferior and inferoseptal akinesis, EF 15%, grade 2 diastolic dysfunction, MAC, small effusion (no tamponade). Post PCI course uneventful. ACEI could not be added due to soft BP prior to D/C.   He was admitted with chest pain associated with SOB.  Given NTG in office with drop in BP to 60 with nausea and diaphoresis.   Tropnin I is neg X 3, He is for cardiac cath today. He is  aware that it may be 1 hour later than expected.  LOS: 1 day   Time spent with pt. :15 minutes. [facility] R  Nurse Practitioner Certified Pager [phone] or after 5pm and on weekends call [phone] 07/10/2013, 8:15 AM   I have personally seen and examined this patient with Cecilie Kicks, NP. I agree with the assessment and plan as outlined above. Cardiac cath today is unchanged. Plan to start Imdur and d/c home later today after bedrest. Follow up in 1 week with Dr. Claiborne Billings or Richardson Dopp, PA-C.   [facility] 11:32 AM 07/10/2013

## 2013-07-10 NOTE — Discharge Summary (Signed)
CARDIOLOGY DISCHARGE SUMMARY   Patient ID: Kristopher [name] MRN: [id number] DOB/AGE: 1951/02/22 63 y.o.  Admit date: 07/09/2013 Discharge date: 07/10/2013  PCP: Dr. Humphrey Rolls in [facility] Primary Cardiologist:  Will be Dr. Claiborne Billings  Primary Discharge Diagnosis:   Unstable angina Secondary Discharge Diagnosis:    CAD (coronary artery disease)   Ischemic cardiomyopathy   HTN (hypertension)   HLD (hyperlipidemia)   Pericardial effusion   Atrial fibrillation s/p PVI ablation at [facility] 2001  Procedure Performed:  1. Left Heart Catheterization 2. Selective Coronary Angiography 3. Left ventricular angiogram  Hospital Course: Kristopher [name] is a 63 y.o. male with a history of  HTN, T2DM, AFib s/p ablation, chronic pericardial effusion. He was admitted 1/8-1/10 with a NSTEMI. Chest CTA was neg for pulmonary embolism. He did have a moderate effusion on CT without signs of tamponade. LHC (06/27/2013): pLAD 10-20, RCA 90, EF 45-50%, inf HK. PCI: Xience Alpine (3 x 23 mm) DES to the RCA. Echo (06/28/2011): Inferior and inferoseptal akinesis, EF 40%, grade 2 diastolic dysfunction, MAC, small effusion (no tamponade). Post PCI course uneventful. ACEI could not be added due to soft BP prior to D/C.   He initially did well after discharge but began developing his previous anginal chest pain. He was seen in the office for this and admitted for further evaluation and treatment.  He was pain-free on home medications plus heparin. His cardiac enzymes were now negative. Further evaluation and cardiac catheterization were recommended. Cardiac catheterization was performed on 1/22.  Results are below. He had no lesions amenable to PCI. Nitrates were added to his medication regimen for small vessel disease. He tolerated the procedure well. Post-procedure, he was ambulating without chest pain or shortness of breath and considered stable for discharge, to followup as an outpatient.  Labs:   Lab Results  Component Value  Date   WBC 5.2 07/10/2013   HGB 12.6* 07/10/2013   HCT 36.0* 07/10/2013   MCV 95.7 07/10/2013   PLT 183 07/10/2013     Recent Labs Lab 07/09/13 1355 07/10/13 0625  NA 140 144  K 4.4 4.2  CL 101 104  CO2 23 27  BUN 21 19  CREATININE 0.89 0.91  CALCIUM 9.4 9.1  PROT 7.0  --   BILITOT 0.4  --   ALKPHOS 80  --   ALT 69*  --   AST 51*  --   GLUCOSE 164* 168*    Recent Labs  07/09/13 1355 07/09/13 1917 07/10/13 0153  TROPONINI <0.30 <0.30 <0.30    Recent Labs  07/09/13 1355  INR 0.95      Cardiac Cath: 07/10/2013 Left main: No obstructive disease.  Left Anterior Descending Artery: Large caliber vessel that courses to the apex. There is diffuse 20% stenosis in the proximal and mid vessel. The distal vessel becomes small in caliber. The diagonal branch is moderate in caliber with no obstructive disease.  Circumflex Artery: Large caliber vessel with three small to moderate caliber obtuse marginal branches. There is mild plaque in the second and third obtuse marginal branches.  Right Coronary Artery: Large dominant vessel with calcification noted in the mid vessel. The proximal vessel has mild plaque. There is a 30% mid stenosis in the calcified segment. The distal stent is patent without restenosis. There is mild diffuse plaque beyond the stent and in the PDA. No obstructive lesions are seen.  Left Ventricular Angiogram: LVEF=40%.  Impression:  1. Single vessel CAD with patent stent RCA  2. Moderate LV systolic dysfunction  Recommendations: Continue medical management of CAD and ischemic cardiomyopathy. Will add Imdur with possible microvascular disease/angina. Discharge later today. Follow up with Dr. Claiborne Billings in one week  EKG: 07/10/2013 Sinus rhythm, inferior Q waves Vent. rate 84 BPM PR interval 152 ms QRS duration 86 ms QT/QTc 396/467 ms P-R-T axes 48 9 18  FOLLOW UP PLANS AND APPOINTMENTS Allergies  Allergen Reactions  . Codeine Nausea And Vomiting  . Other Nausea  And Vomiting    The patient states that he has had  a reaction to all narcotics. He can take them though.     Medication List    ASK your doctor about these medications       aspirin EC 81 MG tablet  Take 1 tablet (81 mg total) by mouth daily.     atorvastatin 80 MG tablet  Commonly known as:  LIPITOR  Take 1 tablet (80 mg total) by mouth daily.     baclofen 10 MG tablet  Commonly known as:  LIORESAL  Take 10 mg by mouth 3 (three) times daily.     buPROPion 300 MG 24 hr tablet  Commonly known as:  WELLBUTRIN XL  Take 300 mg by mouth daily.     cholecalciferol 1000 UNITS tablet  Commonly known as:  VITAMIN D  Take 1,000 Units by mouth daily.     Cinnamon 500 MG capsule  Take 1,000 mg by mouth 2 (two) times daily.     colchicine 0.6 MG tablet  Take 1 tablet (0.6 mg total) by mouth 2 (two) times daily.     cyclobenzaprine 10 MG tablet  Commonly known as:  FLEXERIL  Take 10 mg by mouth 3 (three) times daily as needed for muscle spasms.     fenofibrate 54 MG tablet  Take 54 mg by mouth at bedtime.     gabapentin 300 MG capsule  Commonly known as:  NEURONTIN  Take 600-900 mg by mouth 3 (three) times daily. Take 642m in the morning, 6053min the afternoon, and 90011mt bedtime.     glimepiride 2 MG tablet  Commonly known as:  AMARYL  Take 2 mg by mouth 2 (two) times daily.     ibuprofen 200 MG tablet  Commonly known as:  ADVIL,MOTRIN  Take 600 mg by mouth every 6 (six) hours as needed for moderate pain.     metoprolol succinate 25 MG 24 hr tablet  Commonly known as:  TOPROL-XL  Take 25 mg by mouth at bedtime.     nitroGLYCERIN 0.4 MG SL tablet  Commonly known as:  NITROSTAT  Place 1 tablet (0.4 mg total) under the tongue every 5 (five) minutes as needed for chest pain.     sitaGLIPtin-metformin 50-500 MG per tablet  Commonly known as:  JANUMET  Take 1 tablet by mouth 2 (two) times daily with a meal. Hold for 48 hours, restart on 06/30/2013.     Ticagrelor 90  MG Tabs tablet  Commonly known as:  BRILINTA  Take 1 tablet (90 mg total) by mouth 2 (two) times daily.     VITAMIN B50 COMPLEX PO  Take 1 capsule by mouth daily.         Future Appointments Provider Department Dept Phone   07/14/2013 9:45 AM Mc-Phase2 Monitor 8 MMount [id number]/28/2015 9:45 AM Mc-Phase2 Monitor 8 [phone]/29/2015 8:20 AM LukErlene QuanA-C  [facility] [phone]   07/18/2013 9:45 AM Mc-Phase2 Monitor [place] [phone]   07/21/2013 9:45 AM Mc-Phase2 Monitor [place] [phone]   07/23/2013 9:45 AM Mc-Phase2 Monitor [place] [phone]   07/25/2013 9:45 AM Mc-Phase2 Monitor [place] [phone]   07/28/2013 9:45 AM Mc-Phase2 Monitor [place] [phone]   07/30/2013 9:45 AM Mc-Phase2 Monitor [place] [phone]   08/01/2013 9:45 AM Mc-Phase2 Monitor [place] [phone]   08/04/2013 9:45 AM Mc-Phase2 Monitor [place] [phone]   08/06/2013 9:45 AM Mc-Phase2 Monitor [place] [phone]   08/08/2013 9:45 AM Mc-Phase2 Monitor [place] [phone]   08/11/2013 9:45 AM Mc-Phase2 Monitor [place] [phone]   08/13/2013 9:45 AM Mc-Phase2 Monitor [place] [phone]   08/15/2013 9:45 AM Mc-Phase2 Monitor [place] [phone]   08/18/2013 9:45 AM Mc-Phase2 Monitor [place] [phone]   08/20/2013 9:45 AM Mc-Phase2 Monitor [place]  [phone]   08/22/2013 9:45 AM Mc-Phase2 Monitor [place] [phone]   08/25/2013 9:45 AM Mc-Phase2 Monitor [place] [phone]   08/27/2013 9:45 AM Mc-Phase2 Monitor [place] [phone]   08/29/2013 9:45 AM Mc-Phase2 Monitor [place] [phone]   09/01/2013 9:45 AM Mc-Phase2 Monitor [place] [phone]   09/03/2013 9:45 AM Mc-Phase2 Monitor [place] [phone]   09/05/2013 9:45 AM Mc-Phase2 Monitor [place] [phone]   09/08/2013 9:45 AM Mc-Phase2 Monitor [place] [phone]   09/10/2013 9:45 AM Mc-Phase2 Monitor [place] [phone]   09/12/2013 9:45 AM Mc-Phase2 Monitor [place] [phone]   09/15/2013 9:45 AM Mc-Phase2 Monitor [place] [phone]   09/17/2013 9:45 AM Mc-Phase2 Monitor [place] [phone]   09/19/2013 9:45 AM Mc-Phase2 Monitor [place] [phone]   09/22/2013 9:45 AM Mc-Phase2 Monitor [place] [phone]   09/24/2013 9:45 AM Mc-Phase2 Monitor [place] [phone]   09/26/2013 9:45 AM Mc-Phase2 Monitor [place] [phone]   09/29/2013 9:45 AM Mc-Phase2 Monitor [place] [phone]   10/01/2013 9:45 AM Mc-Phase2 Monitor [place] [phone]   10/03/2013 9:45 AM Mc-Phase2 Monitor [place] [phone]   10/06/2013 9:45 AM Mc-Phase2 Monitor [place] [phone]    10/08/2013 9:45 AM Mc-Phase2 Monitor [place] [phone]   10/10/2013 9:45 AM Mc-Phase2 Monitor [place] [phone]   10/13/2013 9:45 AM Mc-Phase2 Monitor [place] [phone]   10/15/2013 9:45 AM Mc-Phase2 Monitor [place] [phone]   10/17/2013 9:45 AM Mc-Phase2 Monitor [address] [place]  [place] [phone]     Follow-up Information   Follow up with Erlene Quan, PA-C On 07/17/2013. (at 8:20 am)    Specialty:  Cardiology   Contact information:   [address] [place] [address] [place] [address] [phone]       BRING ALL MEDICATIONS WITH YOU TO FOLLOW UP APPOINTMENTS  Time spent with patient to include physician time: 31 min Signed: Rosaria Ferries, PA-C 07/10/2013, 12:00 PM Co-Sign MD

## 2013-07-10 NOTE — Progress Notes (Signed)
Came to visit patient at bedside on behalf of [employer] program for [place] employees/dependents with [employer]. He works at [employer]. He reports he is active with the [employer] DM program at [employer]. Confirmed his telephone number to call to perform post hospital discharge call. Left contact information. Appreciative of visit.  Marthenia Rolling, MSN, RN, BSN- [facility] [phone]

## 2013-07-14 ENCOUNTER — Ambulatory Visit (HOSPITAL_COMMUNITY): Payer: 59

## 2013-07-15 ENCOUNTER — Encounter: Payer: Self-pay | Admitting: Cardiology

## 2013-07-16 ENCOUNTER — Ambulatory Visit (HOSPITAL_COMMUNITY): Payer: 59

## 2013-07-17 ENCOUNTER — Ambulatory Visit (INDEPENDENT_AMBULATORY_CARE_PROVIDER_SITE_OTHER): Payer: 59 | Admitting: Cardiology

## 2013-07-17 ENCOUNTER — Encounter: Payer: Self-pay | Admitting: Cardiology

## 2013-07-17 VITALS — BP 120/60 | HR 100 | Ht 67.0 in | Wt 183.0 lb

## 2013-07-17 DIAGNOSIS — I255 Ischemic cardiomyopathy: Secondary | ICD-10-CM

## 2013-07-17 DIAGNOSIS — I2589 Other forms of chronic ischemic heart disease: Secondary | ICD-10-CM

## 2013-07-17 DIAGNOSIS — I214 Non-ST elevation (NSTEMI) myocardial infarction: Secondary | ICD-10-CM

## 2013-07-17 DIAGNOSIS — I10 Essential (primary) hypertension: Secondary | ICD-10-CM

## 2013-07-17 DIAGNOSIS — E119 Type 2 diabetes mellitus without complications: Secondary | ICD-10-CM | POA: Insufficient documentation

## 2013-07-17 DIAGNOSIS — I2510 Atherosclerotic heart disease of native coronary artery without angina pectoris: Secondary | ICD-10-CM

## 2013-07-17 DIAGNOSIS — I313 Pericardial effusion (noninflammatory): Secondary | ICD-10-CM

## 2013-07-17 DIAGNOSIS — I4891 Unspecified atrial fibrillation: Secondary | ICD-10-CM

## 2013-07-17 DIAGNOSIS — I3139 Other pericardial effusion (noninflammatory): Secondary | ICD-10-CM

## 2013-07-17 DIAGNOSIS — E785 Hyperlipidemia, unspecified: Secondary | ICD-10-CM

## 2013-07-17 DIAGNOSIS — I319 Disease of pericardium, unspecified: Secondary | ICD-10-CM

## 2013-07-17 MED ORDER — METOPROLOL SUCCINATE ER 50 MG PO TB24: 50.0000 mg | ORAL_TABLET | Freq: Every day | ORAL | Status: DC

## 2013-07-17 NOTE — Assessment & Plan Note (Signed)
EF 54%, grade 2 diastolic dysfunction,

## 2013-07-17 NOTE — Assessment & Plan Note (Signed)
DES to the RCA.

## 2013-07-17 NOTE — Assessment & Plan Note (Signed)
One episode of chest pain since discharge

## 2013-07-17 NOTE — Assessment & Plan Note (Signed)
On statin.

## 2013-07-17 NOTE — Assessment & Plan Note (Signed)
NSR today

## 2013-07-17 NOTE — Patient Instructions (Signed)
Your physician recommends that you schedule a follow-up appointment in: 2-3 weeks with Dr Claiborne Billings Increase Toprol to 50 mg daily Gradually increase you activity

## 2013-07-17 NOTE — Assessment & Plan Note (Signed)
Chronic since hx of pericarditis in 2006. "Moderate by CT, small by echo

## 2013-07-17 NOTE — Progress Notes (Signed)
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 He has a history of HTN, T2DM, AFib s/p ablation at [facility] in '00. We saw him in 2006 when he presented with PAF. At that time he was noted to have a pericardial effusion and was treated with NSAIDs. We did not see him again till He 06/26/13 when he presented with a NSTEMI. He had been on no medications prior to his admission. He did have a moderate effusion on CT without signs of tamponade. LHC (06/27/2013): pLAD 10-20, RCA 90, EF 45-50%, inf HK. PCI: Xience Alpine (3 x 23 mm) DES to the RCA. Echo (06/28/2011): Inferior and inferoseptal akinesis, EF 10%, grade 2 diastolic dysfunction, MAC, small effusion (no tamponade). Post PCI course uneventful. ACEI could not be added due to soft BP prior to D/C.              He is in the office today for follow up. Since discharge he did have one episode of SSCP relieved with one NTG. He is scheduled to start rehab. His Troponin was > 20 and his EKG shows inferior Qs indicating he did have a significant MI. He is still out of work.     Current Outpatient Prescriptions  Medication Sig Dispense Refill  . aspirin EC 81 MG tablet Take 1 tablet (81 mg total) by mouth daily.  30 tablet  0  . atorvastatin (LIPITOR) 80 MG tablet Take 1 tablet (80 mg total) by mouth daily.  30 tablet  11  . B Complex-Biotin-FA (VITAMIN B50 COMPLEX PO) Take 1 capsule by mouth daily.      . baclofen (LIORESAL) 10 MG tablet Take 10 mg by mouth 3 (three) times daily.      Marland Kitchen buPROPion (WELLBUTRIN XL) 300 MG 24 hr tablet Take 300 mg by mouth daily.      . cholecalciferol (VITAMIN D) 1000 UNITS tablet Take 1,000 Units by mouth daily.      . Cinnamon 500 MG capsule Take 1,000 mg by mouth 2 (two) times daily.      . colchicine 0.6 MG tablet Take 1 tablet (0.6 mg total) by mouth 2 (two) times daily.  60 tablet  3   . cyclobenzaprine (FLEXERIL) 10 MG tablet Take 10 mg by mouth 3 (three) times daily as needed for muscle spasms.      . fenofibrate 54 MG tablet Take 54 mg by mouth at bedtime.      . gabapentin (NEURONTIN) 300 MG capsule Take 600-900 mg by mouth 3 (three) times daily. Take 631m in the morning, 6025min the afternoon, and 90091mt bedtime.      . gMarland Kitchenimepiride (AMARYL) 2 MG tablet Take 2 mg by mouth 2 (two) times daily.       . iMarland Kitchenuprofen (ADVIL,MOTRIN) 200 MG tablet Take 600 mg by mouth every 6 (six) hours as needed for moderate pain.      . isosorbide mononitrate (IMDUR) 30 MG 24 hr tablet Take 1 tablet (30 mg total) by mouth daily.  30 tablet  11  . nitroGLYCERIN (NITROSTAT) 0.4 MG SL tablet Place 1 tablet (0.4 mg total) under the tongue every 5 (five) minutes as needed for chest pain.  25 tablet  3  . sitaGLIPtin-metformin (JANUMET) 50-500 MG per tablet Take 1 tablet by mouth 2 (two) times daily with  a meal. Hold for 48 hours, restart on 07/13/2013.      . Ticagrelor (BRILINTA) 90 MG TABS tablet Take 1 tablet (90 mg total) by mouth 2 (two) times daily.  60 tablet  11  . metoprolol succinate (TOPROL XL) 50 MG 24 hr tablet Take 1 tablet (50 mg total) by mouth daily.  90 tablet  3   No current facility-administered medications for this visit.    Allergies  Allergen Reactions  . Codeine Nausea And Vomiting  . Other Nausea And Vomiting    The patient states that he has had  a reaction to all narcotics. He can take them though.    History   Social History  . Marital Status: Married    Spouse Name: N/A    Number of Children: N/A  . Years of Education: N/A   Occupational History  . Not on file.   Social History Main Topics  . Smoking status: Former Smoker    Quit date: 05/06/2007  . Smokeless tobacco: Never Used  . Alcohol Use: Yes     Comment: rarely  . Drug Use: No  . Sexual Activity: Yes   Other Topics Concern  . Not on file   Social History Narrative  . No narrative on  file     Review of Systems: General: negative for chills, fever, night sweats or weight changes.  Cardiovascular: negative for chest pain, dyspnea on exertion, edema, orthopnea, palpitations, paroxysmal nocturnal dyspnea or shortness of breath Dermatological: negative for rash Respiratory: negative for cough or wheezing Urologic: negative for hematuria Abdominal: negative for nausea, vomiting, diarrhea, bright red blood per rectum, melena, or hematemesis Neurologic: negative for visual changes, syncope, or dizziness All other systems reviewed and are otherwise negative except as noted above.    Blood pressure 120/60, pulse 100, height 5' 7"  (1.702 m), weight 183 lb (83.008 kg).  General appearance: alert, cooperative and no distress Lungs: clear to auscultation bilaterally Heart: regular rate and rhythm  EKG NSR, Inf Qs  ASSESSMENT AND PLAN:   NSTEMI (non-ST elevated myocardial infarction) One episode of chest pain since discharge  CAD (coronary artery disease) DES to the RCA.  HLD (hyperlipidemia) On statin  HTN (hypertension) Low B/P in hospital, better today in the office  Ischemic cardiomyopathy EF 59%, grade 2 diastolic dysfunction,  Pericardial effusion Chronic since hx of pericarditis in 2006. "Moderate by CT, small by echo  Diabetes mellitus, type 2 .  Atrial fibrillation s/p PVI ablation at [facility] 2001 NSR today   PLAN  I increased Mr Kristopher [name] today as he is relatively tachycardic at rest. I did not add an ACE today but we may want to consider that at his next OV, he had soft B/P in hospital. He will see Dr Claiborne Billings in 3 weeks. He is unable to return to work at this time.   Kristopher [name] KPA-C 07/17/2013 2:12 PM

## 2013-07-17 NOTE — Assessment & Plan Note (Signed)
Low B/P in hospital, better today in the office

## 2013-07-18 ENCOUNTER — Ambulatory Visit (HOSPITAL_COMMUNITY): Payer: 59

## 2013-07-21 ENCOUNTER — Ambulatory Visit (HOSPITAL_COMMUNITY): Payer: 59

## 2013-07-23 ENCOUNTER — Ambulatory Visit (HOSPITAL_COMMUNITY): Payer: 59

## 2013-07-24 ENCOUNTER — Encounter: Payer: 59 | Admitting: Physician Assistant

## 2013-07-24 ENCOUNTER — Encounter (HOSPITAL_COMMUNITY)
Admission: RE | Admit: 2013-07-24 | Discharge: 2013-07-24 | Disposition: A | Discharge status: Home / Self Care | Payer: 59 | Source: Ambulatory Visit | Attending: Internal Medicine | Admitting: Internal Medicine

## 2013-07-24 DIAGNOSIS — I238 Other current complications following acute myocardial infarction: Secondary | ICD-10-CM | POA: Insufficient documentation

## 2013-07-24 DIAGNOSIS — I319 Disease of pericardium, unspecified: Secondary | ICD-10-CM | POA: Insufficient documentation

## 2013-07-24 DIAGNOSIS — I10 Essential (primary) hypertension: Secondary | ICD-10-CM | POA: Insufficient documentation

## 2013-07-24 DIAGNOSIS — I2510 Atherosclerotic heart disease of native coronary artery without angina pectoris: Secondary | ICD-10-CM | POA: Insufficient documentation

## 2013-07-24 DIAGNOSIS — E785 Hyperlipidemia, unspecified: Secondary | ICD-10-CM | POA: Insufficient documentation

## 2013-07-24 DIAGNOSIS — I519 Heart disease, unspecified: Secondary | ICD-10-CM | POA: Insufficient documentation

## 2013-07-24 DIAGNOSIS — Z5189 Encounter for other specified aftercare: Secondary | ICD-10-CM | POA: Insufficient documentation

## 2013-07-24 DIAGNOSIS — I200 Unstable angina: Secondary | ICD-10-CM | POA: Insufficient documentation

## 2013-07-24 DIAGNOSIS — I214 Non-ST elevation (NSTEMI) myocardial infarction: Secondary | ICD-10-CM | POA: Insufficient documentation

## 2013-07-24 DIAGNOSIS — I2589 Other forms of chronic ischemic heart disease: Secondary | ICD-10-CM | POA: Insufficient documentation

## 2013-07-24 DIAGNOSIS — I4891 Unspecified atrial fibrillation: Secondary | ICD-10-CM | POA: Insufficient documentation

## 2013-07-24 NOTE — Progress Notes (Signed)
Cardiac Rehab Medication Review by a Pharmacist  Does the patient  feel that his/her medications are working for him/her?  yes  Has the patient been experiencing any side effects to the medications prescribed?  no  Does the patient measure his/her own blood pressure or blood glucose at home?  yes   Does the patient have any problems obtaining medications due to transportation or finances?   no  Understanding of regimen: excellent Understanding of indications: excellent Potential of compliance: excellent   Kristopher [name] 07/24/2013 9:29 AM

## 2013-07-25 ENCOUNTER — Ambulatory Visit (HOSPITAL_COMMUNITY): Payer: 59

## 2013-07-28 ENCOUNTER — Encounter (HOSPITAL_COMMUNITY)
Admission: RE | Admit: 2013-07-28 | Discharge: 2013-07-28 | Disposition: A | Discharge status: Home / Self Care | Payer: 59 | Source: Ambulatory Visit | Attending: Internal Medicine | Admitting: Internal Medicine

## 2013-07-28 ENCOUNTER — Telehealth: Payer: Self-pay | Admitting: Cardiovascular Disease

## 2013-07-28 ENCOUNTER — Ambulatory Visit (HOSPITAL_COMMUNITY): Payer: 59

## 2013-07-28 LAB — GLUCOSE, CAPILLARY: GLUCOSE-CAPILLARY: 260 mg/dL — AB (ref 70–99)

## 2013-07-28 NOTE — Telephone Encounter (Signed)
Fax received and reviewed by Dr. Claiborne Billings.  Advised no rehab today and add pt on to be seen tomorrow at 3:45pm.    Call to [facility] [place] and informed.  RN also spoke w/ pt and verified x 2.  Pt informed and verbalized understanding.  Kay in Scheduling notified and added pt on to schedule.

## 2013-07-28 NOTE — Progress Notes (Signed)
Kristopher [name] is here today for his first day of exercise at cardiac rehab. The patient reports having chest tightness and shortness of breath yesterday. Mr Durkee said the discomfort was a 5 on a 1-10 scale he took a sublingual nitroglycerin with relief. Mr Iovino reports having no complaints or chest tightness today.  Telemetry rhythm Sinus with a rare PVC. Dr Evette Georges office called and notified. Today's ECG Tracing faxed over to Dr Evette Georges office for review. Kristopher [name] reports having 5 episodes of chest pressure since seeing [name] on 07/17/2013. Dr Evette Georges office recommended that Mr Budai not exercise today. Mr Barton has an appointment to see Dr Claiborne Billings tomorrow at 4:00 pm. Patient aware of appointment.

## 2013-07-28 NOTE — Telephone Encounter (Signed)
Returned call to [place].  Stated pt in for Cardiac Rehab and mentioned he took NTG yesterday for CP.  Stated pt also reported taking 5 NTG since he last saw Lurena Joiner (PA-C).  Stated she paged Lurena Joiner and he said it was okay for pt to exercise, but she wanted Dr. Claiborne Billings to review EKG tracings and let her know if it is okay for pt to proceed or not.  Stated she will not allow pt to exercise until she hears back from Dr. Claiborne Billings.  Will fax EKG now to Nurse Station.  Will give fax to Dr. Claiborne Billings to review once received for further instructions.

## 2013-07-28 NOTE — Telephone Encounter (Signed)
Please call asap-Pt starting rehab today.

## 2013-07-29 ENCOUNTER — Encounter: Payer: Self-pay | Admitting: Cardiovascular Disease

## 2013-07-29 ENCOUNTER — Ambulatory Visit (INDEPENDENT_AMBULATORY_CARE_PROVIDER_SITE_OTHER): Payer: 59 | Admitting: Cardiovascular Disease

## 2013-07-29 VITALS — BP 116/80 | HR 97 | Ht 67.0 in | Wt 180.7 lb

## 2013-07-29 DIAGNOSIS — I255 Ischemic cardiomyopathy: Secondary | ICD-10-CM

## 2013-07-29 DIAGNOSIS — E119 Type 2 diabetes mellitus without complications: Secondary | ICD-10-CM

## 2013-07-29 DIAGNOSIS — R0989 Other specified symptoms and signs involving the circulatory and respiratory systems: Secondary | ICD-10-CM

## 2013-07-29 DIAGNOSIS — I10 Essential (primary) hypertension: Secondary | ICD-10-CM

## 2013-07-29 DIAGNOSIS — I2589 Other forms of chronic ischemic heart disease: Secondary | ICD-10-CM

## 2013-07-29 DIAGNOSIS — I2510 Atherosclerotic heart disease of native coronary artery without angina pectoris: Secondary | ICD-10-CM

## 2013-07-29 DIAGNOSIS — R0683 Snoring: Secondary | ICD-10-CM

## 2013-07-29 DIAGNOSIS — R079 Chest pain, unspecified: Secondary | ICD-10-CM

## 2013-07-29 DIAGNOSIS — E785 Hyperlipidemia, unspecified: Secondary | ICD-10-CM

## 2013-07-29 DIAGNOSIS — I493 Ventricular premature depolarization: Secondary | ICD-10-CM | POA: Insufficient documentation

## 2013-07-29 DIAGNOSIS — I4949 Other premature depolarization: Secondary | ICD-10-CM

## 2013-07-29 DIAGNOSIS — Z79899 Other long term (current) drug therapy: Secondary | ICD-10-CM

## 2013-07-29 DIAGNOSIS — I4891 Unspecified atrial fibrillation: Secondary | ICD-10-CM

## 2013-07-29 DIAGNOSIS — R002 Palpitations: Secondary | ICD-10-CM

## 2013-07-29 DIAGNOSIS — I214 Non-ST elevation (NSTEMI) myocardial infarction: Secondary | ICD-10-CM

## 2013-07-29 DIAGNOSIS — R0609 Other forms of dyspnea: Secondary | ICD-10-CM

## 2013-07-29 MED ORDER — METOPROLOL SUCCINATE ER 100 MG PO TB24: 100.0000 mg | ORAL_TABLET | Freq: Every day | ORAL | Status: DC

## 2013-07-29 NOTE — Progress Notes (Signed)
Patient ID: Kristopher [name], male   DOB: September 16, 1950, 63 y.o.   MRN: [id number]     HPI: Kristopher [name] is a 63 y.o. male who presents to the office for cardiology evaluation and followup of his recent hospitalizations, non-ST segment elevation myocardial infarction with coronary intervention, and recurrent chest pain episodes.  [name] is a 63 year old male has a history of type 2 diabetes mellitus, hypertension, atrial fibrillation who is status post ablation, as well as small chronic pericardial effusion documented for at least 7 years. He presented to [facility] on 06/27/2013 after experiencing increasing chest pain radiating to his upper extremity and back. Cardiac markers were positive with a troponin of 7.54 compatible with a non-ST segment elevation. He underwent acute catheterization by me on 06/27/2013 and was found to have high-grade diffuse 90+ percent distal RCA stenoses beyond the acute margin and proximal to the PDA takeoff. He had forward an ejection fraction of 45-50% and mild 10 at 20% narrowing in the LAD with a normal circumflex vessel. He underwent successful stenting of his distal RCA and a Xience Alpine DES stent 3.0x23 mm was inserted and post dilated to 3.25 mm. He was treated with aspirin and Brilinta for dual antiplatelet therapy. His troponin ultimately peaked at 20. Apparently after discharge, he developed recurrent chest pain leading to readmission and he underwent repeat cardiac catheterization by Dr. Emelda Brothers on 07/10/13. His RCA stent was widely patent and ejection fraction was 40%.He later saw [name], [facility] on 07/17/13 and is was 100. His beta blocker was increased from Toprol-XL 25 to 50 mg. He has experienced some episodes of chest discomfort since that time. He presented to cardiac rehabilitation yesterday and because of his recent chest pain they did not feel comfortable having her exercise. He presents to our office for me to evaluate him today.  The patient admits to  having a chronic effusion since at least 2008. An echo Doppler study done on 06/27/2013 revealed an ejection fraction of 40% with grade 2 diastolic dysfunction. A small circumferential paracardial effusion with no evidence for temp and was again demonstrated.  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 He states that his wife seems to notice abnormal breathing during sleep with apparent possible Cheyne-Stokes respirations. She checked his oxygen saturation this did drop to 82%.  Past Medical History  Diagnosis Date  . Herniated cervical disc     c2-T1 fusion  . Chronic pain     back pain  . Atrial fibrillation     s/p PVI X2 by Dr Omelia Blackwater at [facility] around 2000  . NSTEMI (non-ST elevated myocardial infarction) 06-27-2013  . CAD (coronary artery disease)     a. NSTEMI (06/2013):  LHC (06/27/2013):  pLAD 10-20, RCA 90, EF 45-50%, inf HK.  PCI:  Xience Alpine (3 x 23 mm) DES to the RCA.  . Ischemic cardiomyopathy     a. Echo (06/28/2011): Inferior and inferoseptal akinesis, EF 29%, grade 2 diastolic dysfunction, MAC, small effusion (no tamponade)  . HTN (hypertension)   . HLD (hyperlipidemia)   . Pericardial effusion     chronic since hx of pericarditis in 2004  . Anginal pain   . Depression   . Shortness of breath   . Diabetes mellitus without complication     TYPE 2  . Arthritis     SPINE    OSTEO    Past Surgical History  Procedure Laterality Date  . Back surgery  fusion c3-t1  . Ablation  2000    PVI X2 by Dr Omelia Blackwater at [facility]  . Appendectomy    . Tonsillectomy      Allergies  Allergen Reactions  . Codeine Nausea And Vomiting  . Other Nausea And Vomiting    The patient states that he has had  a reaction to all narcotics. He can take them though.    Current Outpatient Prescriptions  Medication Sig Dispense Refill  . aspirin EC 81 MG tablet Take 1 tablet (81 mg total) by mouth daily.  30 tablet  0  . atorvastatin (LIPITOR) 80 MG tablet Take 1 tablet (80 mg total) by  mouth daily.  30 tablet  11  . B Complex-Biotin-FA (VITAMIN B50 COMPLEX PO) Take 1 capsule by mouth daily.      . baclofen (LIORESAL) 10 MG tablet Take 10 mg by mouth 3 (three) times daily.      Marland Kitchen buPROPion (WELLBUTRIN XL) 300 MG 24 hr tablet Take 300 mg by mouth daily.      . cholecalciferol (VITAMIN D) 1000 UNITS tablet Take 1,000 Units by mouth daily.      . Cinnamon 500 MG capsule Take 1,000 mg by mouth 2 (two) times daily.      . colchicine 0.6 MG tablet Take 1 tablet (0.6 mg total) by mouth 2 (two) times daily.  60 tablet  3  . cyclobenzaprine (FLEXERIL) 10 MG tablet Take 10 mg by mouth 3 (three) times daily as needed for muscle spasms.      . fenofibrate 54 MG tablet Take 54 mg by mouth at bedtime.      . gabapentin (NEURONTIN) 300 MG capsule Take 600-900 mg by mouth 3 (three) times daily. Take 651m in the morning, 602min the afternoon, and 90061mt bedtime.      . gMarland Kitchenimepiride (AMARYL) 2 MG tablet Take 2 mg by mouth 2 (two) times daily.       . iMarland Kitchenuprofen (ADVIL,MOTRIN) 200 MG tablet Take 600 mg by mouth every 6 (six) hours as needed for moderate pain.      . isosorbide mononitrate (IMDUR) 30 MG 24 hr tablet Take 1 tablet (30 mg total) by mouth daily.  30 tablet  11  . metoprolol succinate (TOPROL-XL) 100 MG 24 hr tablet Take 1 tablet (100 mg total) by mouth daily.  30 tablet  6  . nitroGLYCERIN (NITROSTAT) 0.4 MG SL tablet Place 1 tablet (0.4 mg total) under the tongue every 5 (five) minutes as needed for chest pain.  25 tablet  3  . sitaGLIPtin-metformin (JANUMET) 50-500 MG per tablet Take 1 tablet by mouth 2 (two) times daily with a meal. Hold for 48 hours, restart on 07/13/2013.      . Ticagrelor (BRILINTA) 90 MG TABS tablet Take 1 tablet (90 mg total) by mouth 2 (two) times daily.  60 tablet  11   No current facility-administered medications for this visit.    History   Social History  . Marital Status: Married    Spouse Name: N/A    Number of Children: N/A  . Years of  Education: N/A   Occupational History  . Not on file.   Social History Main Topics  . Smoking status: Former Smoker    Quit date: 05/06/2007  . Smokeless tobacco: Never Used  . Alcohol Use: Yes     Comment: rarely  . Drug Use: No  . Sexual Activity: Yes   Other Topics Concern  . Not on  file   Social History Narrative  . No narrative on file   Socially he is married. 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 There is no tobacco use.  No family history on file.  ROS is negative for fevers, chills or night sweats. He denies headaches. He denies change in vision or hearing. He denies wheezing. He does note at times while shortness of breath. He denies PND or orthopnea. He has expressed recurrent episodes of chest discomfort described as tightness. He denies nausea vomiting or diarrhea. He's aware blood in stool or urine. He denies myalgias. He denies claudication symptoms. He is diabetic. He does have hyperlipidemia. According to his wife his sleep breathing is abnormal. Other comprehensive 14 point system review is negative.  PE BP 116/80  Pulse 97  Ht 5' 7"  (1.702 m)  Wt 180 lb 11.2 oz (81.965 kg)  BMI 28.29 kg/m2  General: Alert, oriented, no distress.  Skin: normal turgor, no rashes HEENT: Normocephalic, atraumatic. Pupils round and reactive; sclera anicteric;no lid lag. Extraocular muscles intact. No Nose without nasal septal hypertrophy Mouth/Parynx benign; Mallinpatti scale 3 Neck: No JVD, no carotid bruits; normal carotid upstroke Lungs: clear to ausculatation and percussion; no wheezing or rales Chest wall: no tenderness to palpitation Heart:  trigeminal pattern rhythm, s1 s2 normal no S3 or S4 gallop. No rub. Over 6 systolic murmur to Abdomen: soft, nontender; no hepatosplenomehaly, BS+; abdominal aorta nontender and not dilated by palpation. Back: no CVA tenderness Pulses 2+ right femoral and right radial cath sites well healed    Extremities: no clubbing cyanosis or edema, Homan's sign negative  Neurologic: grossly nonfocal; cranial nerves grossly normal. Psychologic: normal affect and mood.  ECG (independently read by me): Underlying sinus rhythm with ventricular trigeminy. Sinus rate 97 beats per minute. A small Q wave in lead 3.  LABS:  BMET    Component Value Date/Time   NA 144 07/10/2013 0625   K 4.2 07/10/2013 0625   CL 104 07/10/2013 0625   CO2 27 07/10/2013 0625   GLUCOSE 168* 07/10/2013 0625   BUN 19 07/10/2013 0625   CREATININE 0.91 07/10/2013 0625   CALCIUM 9.1 07/10/2013 0625   GFRNONAA >90 07/10/2013 0625   GFRAA >90 07/10/2013 0625     Hepatic Function Panel     Component Value Date/Time   PROT 7.0 07/09/2013 1355   ALBUMIN 3.9 07/09/2013 1355   AST 51* 07/09/2013 1355   ALT 69* 07/09/2013 1355   ALKPHOS 80 07/09/2013 1355   BILITOT 0.4 07/09/2013 1355     CBC    Component Value Date/Time   WBC 5.2 07/10/2013 0625   RBC 3.76* 07/10/2013 0625   HGB 12.6* 07/10/2013 0625   HCT 36.0* 07/10/2013 0625   PLT 183 07/10/2013 0625   MCV 95.7 07/10/2013 0625   MCH 33.5 07/10/2013 0625   MCHC 35.0 07/10/2013 0625   RDW 13.5 07/10/2013 0625   LYMPHSABS 1.3 07/09/2013 1355   MONOABS 0.5 07/09/2013 1355   EOSABS 0.2 07/09/2013 1355   BASOSABS 0.0 07/09/2013 1355     BNP No results found for this basename: probnp    Lipid Panel     Component Value Date/Time   CHOL 175 06/27/2013 0630   TRIG 126 06/27/2013 0630   HDL 31* 06/27/2013 0630   CHOLHDL 5.6 06/27/2013 0630   VLDL 25 06/27/2013 0630   LDLCALC 119* 06/27/2013 0630     RADIOLOGY: No results found.    ASSESSMENT  AND PLAN: Mr. Tomothy Eddins is a 63 year old gentleman who has a history of diabetes mellitus for at least 7 years a history of hyperlipidemia, who is status post development of a non-ST segment elevation myocardial infarction secondary to high-grade distal RCA disease which was successfully stented on 06/27/2013. Repeat catheterization for  recurrent chest pain showed the stent to be widely patent. He does have mild LV dysfunction. EKG shows ventricular trigeminal rhythm. I'm recommending further titration of his Toprol-XL to 75 mg for the next week and then he will increase this to 100 mg daily. Depending upon his blood pressure response, he will also be a candidate for initiation of ARB or ACE inhibition. He does have chronic per coronal fusion which was small on his most recent echo although suggested to be moderate on a prior CT. 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 I'm scheduling him for a diagnostic polysomnogram for further evaluation. In 4 weeks, he'll undergo repeat laboratory consisting of a CBC, has a metabolic panel, and NMR , TSH, as well as magnesium level and I'll see him in followup thereafter.     Troy Sine, MD, [facility]  07/29/2013 5:54 PM

## 2013-07-29 NOTE — Patient Instructions (Addendum)
Your physician has recommended you make the following change in your medication: increase the metoprolol succ to 75 mg daily for 1 week, ( 1 & 1/2 tablet of the 50 mg ) then increase to 129m daily. The 100 mg tablets has already been sent to the pharmacy.  Your physician recommends that you return for lab work in: 4 weeks.  Your physician recommends that you schedule a follow-up appointment in: 6 weeks.  Your physician has recommended that you have a sleep study. This test records several body functions during sleep, including: brain activity, eye movement, oxygen and carbon dioxide blood levels, heart rate and rhythm, breathing rate and rhythm, the flow of air through your mouth and nose, snoring, body muscle movements, and chest and belly movement.

## 2013-07-30 ENCOUNTER — Encounter (HOSPITAL_COMMUNITY): Payer: 59

## 2013-07-30 ENCOUNTER — Ambulatory Visit (HOSPITAL_COMMUNITY): Payer: 59

## 2013-07-31 ENCOUNTER — Telehealth: Payer: Self-pay | Admitting: Cardiology

## 2013-07-31 ENCOUNTER — Telehealth (HOSPITAL_COMMUNITY): Payer: Self-pay | Admitting: *Deleted

## 2013-07-31 ENCOUNTER — Other Ambulatory Visit: Payer: Self-pay | Admitting: *Deleted

## 2013-07-31 DIAGNOSIS — I2510 Atherosclerotic heart disease of native coronary artery without angina pectoris: Secondary | ICD-10-CM

## 2013-07-31 DIAGNOSIS — Z0189 Encounter for other specified special examinations: Secondary | ICD-10-CM

## 2013-07-31 DIAGNOSIS — R0683 Snoring: Secondary | ICD-10-CM

## 2013-07-31 NOTE — Telephone Encounter (Signed)
Pt recently with chest pain and adjustment in meds.  Resume cardiac rehab.  On Monday.   Call if further problems.

## 2013-08-01 ENCOUNTER — Ambulatory Visit (HOSPITAL_COMMUNITY): Payer: 59

## 2013-08-01 ENCOUNTER — Encounter (HOSPITAL_COMMUNITY): Payer: 59

## 2013-08-04 ENCOUNTER — Ambulatory Visit (HOSPITAL_COMMUNITY): Payer: 59

## 2013-08-04 ENCOUNTER — Encounter (HOSPITAL_COMMUNITY)
Admission: RE | Admit: 2013-08-04 | Discharge: 2013-08-04 | Disposition: A | Discharge status: Home / Self Care | Payer: 59 | Source: Ambulatory Visit | Attending: Internal Medicine | Admitting: Internal Medicine

## 2013-08-04 LAB — GLUCOSE, CAPILLARY: GLUCOSE-CAPILLARY: 352 mg/dL — AB (ref 70–99)

## 2013-08-04 NOTE — Progress Notes (Signed)
Kristopher [name] reported for his first day of exercise at cardiac rehab.  CBG 352. Urine negative for ketones.  No exercise today per protocol. Dr Laurelyn Sickle office called and notified of elevated CBG. Patient reported not taking his Amaryl over the weekend.  Dr Laurelyn Sickle office called and notified of Kristopher [name]'s blood sugar elevation. Patient instructed to drink a lot of water. Kristopher [name] said he ran out of his Amaryl over the weekend and is going to the pharmacy to pick up his medication.  Will fax exercise flow sheets to Dr. Laurelyn Sickle office for review. Patient instructed to check his blood sugar prior to coming to exercise on Wednesday and not to come to exercise if his blood sugar is greater than 300.

## 2013-08-06 ENCOUNTER — Ambulatory Visit (HOSPITAL_COMMUNITY): Payer: 59

## 2013-08-06 ENCOUNTER — Encounter (HOSPITAL_COMMUNITY)
Admission: RE | Admit: 2013-08-06 | Discharge: 2013-08-06 | Disposition: A | Discharge status: Home / Self Care | Payer: 59 | Source: Ambulatory Visit | Attending: Internal Medicine | Admitting: Internal Medicine

## 2013-08-06 LAB — GLUCOSE, CAPILLARY
GLUCOSE-CAPILLARY: 232 mg/dL — AB (ref 70–99)
Glucose-Capillary: 195 mg/dL — ABNORMAL HIGH (ref 70–99)

## 2013-08-06 NOTE — Progress Notes (Signed)
Richardson Landry completed exercise without difficulty today. Telemetry rhythm Sinus. VSS. Will continue to monitor the patient throughout  the program.

## 2013-08-07 ENCOUNTER — Encounter: Payer: Self-pay | Admitting: Cardiovascular Disease

## 2013-08-07 ENCOUNTER — Ambulatory Visit (INDEPENDENT_AMBULATORY_CARE_PROVIDER_SITE_OTHER): Payer: 59 | Admitting: Cardiovascular Disease

## 2013-08-07 VITALS — BP 128/72 | HR 86 | Ht 67.0 in | Wt 183.2 lb

## 2013-08-07 DIAGNOSIS — F3289 Other specified depressive episodes: Secondary | ICD-10-CM

## 2013-08-07 DIAGNOSIS — I493 Ventricular premature depolarization: Secondary | ICD-10-CM

## 2013-08-07 DIAGNOSIS — I4949 Other premature depolarization: Secondary | ICD-10-CM

## 2013-08-07 DIAGNOSIS — F32A Depression, unspecified: Secondary | ICD-10-CM

## 2013-08-07 DIAGNOSIS — R0789 Other chest pain: Secondary | ICD-10-CM

## 2013-08-07 DIAGNOSIS — E119 Type 2 diabetes mellitus without complications: Secondary | ICD-10-CM

## 2013-08-07 DIAGNOSIS — F329 Major depressive disorder, single episode, unspecified: Secondary | ICD-10-CM

## 2013-08-07 DIAGNOSIS — E785 Hyperlipidemia, unspecified: Secondary | ICD-10-CM

## 2013-08-07 DIAGNOSIS — I2510 Atherosclerotic heart disease of native coronary artery without angina pectoris: Secondary | ICD-10-CM

## 2013-08-07 LAB — CBC
HEMATOCRIT: 36.2 % — AB (ref 39.0–52.0)
Hemoglobin: 13.2 g/dL (ref 13.0–17.0)
MCH: 34.5 pg — AB (ref 26.0–34.0)
MCHC: 36.5 g/dL — ABNORMAL HIGH (ref 30.0–36.0)
MCV: 94.5 fL (ref 78.0–100.0)
PLATELETS: 205 10*3/uL (ref 150–400)
RBC: 3.83 MIL/uL — ABNORMAL LOW (ref 4.22–5.81)
RDW: 13.8 % (ref 11.5–15.5)
WBC: 5.4 10*3/uL (ref 4.0–10.5)

## 2013-08-07 LAB — COMPREHENSIVE METABOLIC PANEL
ALT: 75 U/L — ABNORMAL HIGH (ref 0–53)
AST: 44 U/L — ABNORMAL HIGH (ref 0–37)
Albumin: 4.4 g/dL (ref 3.5–5.2)
Alkaline Phosphatase: 72 U/L (ref 39–117)
BILIRUBIN TOTAL: 0.8 mg/dL (ref 0.2–1.2)
BUN: 20 mg/dL (ref 6–23)
CHLORIDE: 100 meq/L (ref 96–112)
CO2: 28 meq/L (ref 19–32)
CREATININE: 0.98 mg/dL (ref 0.50–1.35)
Calcium: 8.8 mg/dL (ref 8.4–10.5)
Glucose, Bld: 218 mg/dL — ABNORMAL HIGH (ref 70–99)
Potassium: 4 mEq/L (ref 3.5–5.3)
Sodium: 139 mEq/L (ref 135–145)
Total Protein: 6.4 g/dL (ref 6.0–8.3)

## 2013-08-07 LAB — TSH: TSH: 1.706 u[IU]/mL (ref 0.350–4.500)

## 2013-08-07 LAB — MAGNESIUM: Magnesium: 1.4 mg/dL — ABNORMAL LOW (ref 1.5–2.5)

## 2013-08-07 MED ORDER — ISOSORBIDE MONONITRATE ER 60 MG PO TB24: 60.0000 mg | ORAL_TABLET | Freq: Every day | ORAL | Status: DC

## 2013-08-07 NOTE — Progress Notes (Signed)
Patient ID: ORVIE CARADINE, male   DOB: Jan 27, 1951, 63 y.o.   MRN: [id number]      HPI: MARLOW BERENGUER is a 63 y.o. male who presents to the office for cardiology evaluation and followup of his recent hospitalizations, non-ST segment elevation myocardial infarction with coronary intervention, and recurrent chest pain episodes.  Mr. Dieudonne is a 63 year old male with a history of type 2 diabetes mellitus, hypertension, atrial fibrillation who is status post ablation, as well as small chronic pericardial effusion documented for at least 7 years. He presented to [facility] on 06/27/2013 with chest pain radiating to his upper extremity and back. Cardiac markers were positive with a troponin of 7.54 compatible with NSTEMI. He underwent acute catheterization by me on 06/27/2013 and was found to have high-grade diffuse 90+ % distal RCA stenoses beyond the acute margin and proximal to the PDA takeoff  and mild 10 at 20% narrowing in the LAD with a normal circumflex vessel. EF 45-50%. He underwent successful stenting of his distal RCA and a Xience Alpine DES stent 3.0x23 mm was inserted and post dilated to 3.25 mm. He was treated with aspirin and Brilinta for dual antiplatelet therapy. His troponin peaked at 20.  Post discharge, he developed recurrent chest pain leading to readmission and he underwent repeat cardiac catheterization by Dr. Shon Hough on 07/10/13. His RCA stent was widely patent and ejection fraction was 40%.He later saw [name], PAC on 07/17/13 and his pulse was 100. His beta blocker was increased from Toprol-XL 25 to 50 mg. He has experienced some episodes of chest discomfort since that time. He presented to cardiac rehabilitation 2 weeks ago and because of his recent chest pain they did not feel comfortable having her exercise. And saw me in our office on 07/29/13.   He has  a chronic peicardialeffusion since at least 2008. An echo Doppler study done on 06/27/2013 revealed an ejection fraction of 40%  with grade 2 diastolic dysfunction. A small circumferential paracardial effusion with no evidence for temp and was again demonstrated.  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 He states that his wife seems to notice abnormal breathing during sleep with apparent possible Cheyne-Stokes respirations. She checked his oxygen saturation this did drop to 82%. When I saw him, I recommended that he undergo a sleep study. This has not yet been done but is scheduled for 09/01/2013.  Patient has continued to experience mild episodes of chest pressure. Oftentimes these are nonexertional. He did take sublingual nitroglycerin and felt improvement in approximately 5 minutes. He denies exertional chest pain. Yesterday, he was able to exercise at cardiac rehabilitation and felt well and denied any exertionally precipitated chest tightness. Also, when I had seen him I further titrate his Toprol to 100 mg daily. At the time of that evaluation he did have an increased pulse rate with ventricular trigeminy. He feels his pulse is more stable and he is not aware of any extra heartbeats.  Past Medical History  Diagnosis Date  . Herniated cervical disc     c2-T1 fusion  . Chronic pain     back pain  . Atrial fibrillation     s/p PVI X2 by Dr Omelia Blackwater at [facility] around 2000  . NSTEMI (non-ST elevated myocardial infarction) 06-27-2013  . CAD (coronary artery disease)     a. NSTEMI (06/2013):  LHC (06/27/2013):  pLAD 10-20, RCA 90, EF 45-50%, inf HK.  PCI:  Xience Alpine (3 x 23 mm) DES to the  RCA.  . Ischemic cardiomyopathy     a. Echo (06/28/2011): Inferior and inferoseptal akinesis, EF 50%, grade 2 diastolic dysfunction, MAC, small effusion (no tamponade)  . HTN (hypertension)   . HLD (hyperlipidemia)   . Pericardial effusion     chronic since hx of pericarditis in 2004  . Anginal pain   . Depression   . Shortness of breath   . Diabetes mellitus without complication     TYPE 2  . Arthritis     SPINE    OSTEO     Past Surgical History  Procedure Laterality Date  . Back surgery      fusion c3-t1  . Ablation  2000    PVI X2 by Dr Omelia Blackwater at [facility]  . Appendectomy    . Tonsillectomy      Allergies  Allergen Reactions  . Codeine Nausea And Vomiting  . Other Nausea And Vomiting    The patient states that he has had  a reaction to all narcotics. He can take them though.    Current Outpatient Prescriptions  Medication Sig Dispense Refill  . aspirin EC 81 MG tablet Take 1 tablet (81 mg total) by mouth daily.  30 tablet  0  . atorvastatin (LIPITOR) 80 MG tablet Take 1 tablet (80 mg total) by mouth daily.  30 tablet  11  . B Complex-Biotin-FA (VITAMIN B50 COMPLEX PO) Take 1 capsule by mouth daily.      . baclofen (LIORESAL) 10 MG tablet Take 10 mg by mouth 3 (three) times daily.      Marland Kitchen buPROPion (WELLBUTRIN XL) 300 MG 24 hr tablet Take 300 mg by mouth daily.      . cholecalciferol (VITAMIN D) 1000 UNITS tablet Take 1,000 Units by mouth daily.      . Cinnamon 500 MG capsule Take 1,000 mg by mouth 2 (two) times daily.      . colchicine 0.6 MG tablet Take 1 tablet (0.6 mg total) by mouth 2 (two) times daily.  60 tablet  3  . cyclobenzaprine (FLEXERIL) 10 MG tablet Take 10 mg by mouth 3 (three) times daily as needed for muscle spasms.      . fenofibrate 54 MG tablet Take 54 mg by mouth at bedtime.      . gabapentin (NEURONTIN) 300 MG capsule Take 600-900 mg by mouth 3 (three) times daily. Take 650m in the morning, 6052min the afternoon, and 90086mt bedtime.      . gMarland Kitchenimepiride (AMARYL) 2 MG tablet Take 2 mg by mouth 2 (two) times daily.       . iMarland Kitchenuprofen (ADVIL,MOTRIN) 200 MG tablet Take 600 mg by mouth every 6 (six) hours as needed for moderate pain.      . metoprolol succinate (TOPROL-XL) 100 MG 24 hr tablet Take 1 tablet (100 mg total) by mouth daily.  30 tablet  6  . nitroGLYCERIN (NITROSTAT) 0.4 MG SL tablet Place 1 tablet (0.4 mg total) under the tongue every 5 (five) minutes as needed for  chest pain.  25 tablet  3  . sitaGLIPtin-metformin (JANUMET) 50-500 MG per tablet Take 1 tablet by mouth 2 (two) times daily with a meal. Hold for 48 hours, restart on 07/13/2013.      . Ticagrelor (BRILINTA) 90 MG TABS tablet Take 1 tablet (90 mg total) by mouth 2 (two) times daily.  60 tablet  11  . [DISCONTINUED] isosorbide mononitrate (IMDUR) 30 MG 24 hr tablet Take 1 tablet (30 mg total) by  mouth daily.  30 tablet  11   No current facility-administered medications for this visit.    History   Social History  . Marital Status: Married    Spouse Name: N/A    Number of Children: N/A  . Years of Education: N/A   Occupational History  . Not on file.   Social History Main Topics  . Smoking status: Former Smoker    Quit date: 05/06/2007  . Smokeless tobacco: Never Used  . Alcohol Use: Yes     Comment: rarely  . Drug Use: No  . Sexual Activity: Yes   Other Topics Concern  . Not on file   Social History Narrative  . No narrative on file   Socially he is married. 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 There is no tobacco use.  History reviewed. No pertinent family history.  ROS is negative for fevers, chills or night sweats. He denies headaches. He denies change in vision or hearing. He denies wheezing. He does note at times while shortness of breath. He denies PND or orthopnea. He has expressed recurrent episodes of chest discomfort described as tightness. He denies nausea vomiting or diarrhea. He's aware blood in stool or urine. He denies myalgias. He denies claudication symptoms. He is diabetic. He does have hyperlipidemia.  His sleep breathing is abnormal. He does have history of depression for which he has been on Wellbutrin. Other comprehensive 14 point system review is negative.  PE BP 128/72  Pulse 86  Ht 5' 7"  (1.702 m)  Wt 183 lb 3.2 oz (83.099 kg)  BMI 28.69 kg/m2  General: Alert, oriented, no distress.  Skin: normal turgor,  no rashes HEENT: Normocephalic, atraumatic. Pupils round and reactive; sclera anicteric;no lid lag. Extraocular muscles intact. No Nose without nasal septal hypertrophy Mouth/Parynx benign; Mallinpatti scale 3 Neck: No JVD, no carotid bruits; normal carotid upstroke Lungs: clear to ausculatation and percussion; no wheezing or rales Chest wall: no tenderness to palpitation Heart:  trigeminal pattern rhythm, s1 s2 normal no S3 or S4 gallop. No rub. Over 6 systolic murmur to Abdomen: soft, nontender; no hepatosplenomehaly, BS+; abdominal aorta nontender and not dilated by palpation. Back: no CVA tenderness Pulses 2+ right femoral and right radial cath sites well healed  Extremities: no clubbing cyanosis or edema, Homan's sign negative  Neurologic: grossly nonfocal; cranial nerves grossly normal. Psychologic: normal affect and mood.  ECG (independently read by me): Sinus rhythm at 86; no ectopy  Prior ECG of 07/29/2013 (independently read by me): Underlying sinus rhythm with ventricular trigeminy. Sinus rate 97 beats per minute. A small Q wave in lead 3.  LABS:  BMET    Component Value Date/Time   NA 144 07/10/2013 0625   K 4.2 07/10/2013 0625   CL 104 07/10/2013 0625   CO2 27 07/10/2013 0625   GLUCOSE 168* 07/10/2013 0625   BUN 19 07/10/2013 0625   CREATININE 0.91 07/10/2013 0625   CALCIUM 9.1 07/10/2013 0625   GFRNONAA >90 07/10/2013 0625   GFRAA >90 07/10/2013 0625     Hepatic Function Panel     Component Value Date/Time   PROT 7.0 07/09/2013 1355   ALBUMIN 3.9 07/09/2013 1355   AST 51* 07/09/2013 1355   ALT 69* 07/09/2013 1355   ALKPHOS 80 07/09/2013 1355   BILITOT 0.4 07/09/2013 1355     CBC    Component Value Date/Time   WBC 5.2 07/10/2013 0625   RBC 3.76* 07/10/2013 2297  HGB 12.6* 07/10/2013 0625   HCT 36.0* 07/10/2013 0625   PLT 183 07/10/2013 0625   MCV 95.7 07/10/2013 0625   MCH 33.5 07/10/2013 0625   MCHC 35.0 07/10/2013 0625   RDW 13.5 07/10/2013 0625   LYMPHSABS 1.3  07/09/2013 1355   MONOABS 0.5 07/09/2013 1355   EOSABS 0.2 07/09/2013 1355   BASOSABS 0.0 07/09/2013 1355     BNP No results found for this basename: probnp    Lipid Panel     Component Value Date/Time   CHOL 175 06/27/2013 0630   TRIG 126 06/27/2013 0630   HDL 31* 06/27/2013 0630   CHOLHDL 5.6 06/27/2013 0630   VLDL 25 06/27/2013 0630   LDLCALC 119* 06/27/2013 0630     RADIOLOGY: No results found.    ASSESSMENT AND PLAN: Mr. Briton Sellman is a 63 year old gentleman with diabetes mellitus for at least 7 years, a history of hyperlipidemia, who suffered a recent non-ST segment elevation myocardial infarction secondary to high-grade distal RCA disease which was successfully stented on 06/27/2013. Repeat catheterization for recurrent chest pain showed the stent to be widely patent. He does have mild LV dysfunction. Since I last saw him, I had further titrate his Toprol to 75 mg and then 100 mg per his resting pulse is now stable at 86. He's not having any ectopy. His blood pressure today by me was 105/70 and it is not appear to be significant additional room for further beta blocker titration. He still experiences some episodes of chest pressure which he states oftentimes is not exertional but when he did take sublingual nitroglycerin he did note improvement in approximately 5 minutes. I recommended slight additional titration of his isosorbide mononitrate to 60 mg. I'm also scheduling him for a exercise Myoview study in several weeks after at least 2 weeks following his MI evaluate scar/ischemia. I do believe he may very well have sleep apnea and a sleep study is scheduled. He never did have his recent laboratory. He's fasting today. We will check laboratory today. As such we made to his medications if necessary. I will see him in the office in followup of his sleep study and exercise Myoview and further recommendations will be made at that time.   Troy Sine, MD, [facility]  08/07/2013 8:36 AM

## 2013-08-07 NOTE — Patient Instructions (Signed)
Your physician recommends that you return for lab work today. The orders are already in the computer from your last visit.  Your physician has requested that you have en exercise stress myoview. For further information please visit [email or web address]. Please follow instruction sheet, as given. This will be done in about 3 weeks.  Your physician has recommended you make the following change in your medication: increase the isosorbide to 60 mg daily.  Your physician recommends that you schedule a follow-up appointment in: 3-4 weeks.  Keep sleep study appointment as scheduled.

## 2013-08-08 ENCOUNTER — Encounter (HOSPITAL_COMMUNITY)
Admission: RE | Admit: 2013-08-08 | Discharge: 2013-08-08 | Disposition: A | Discharge status: Home / Self Care | Payer: 59 | Source: Ambulatory Visit | Attending: Internal Medicine | Admitting: Internal Medicine

## 2013-08-08 ENCOUNTER — Ambulatory Visit (HOSPITAL_COMMUNITY): Payer: 59

## 2013-08-08 LAB — NMR LIPOPROFILE WITH LIPIDS
Cholesterol, Total: 106 mg/dL (ref <200)
HDL PARTICLE NUMBER: 27.5 umol/L — AB (ref >=30.5)
HDL SIZE: 9.6 nm (ref >=9.2)
HDL-C: 27 mg/dL — AB (ref >=40)
LARGE HDL: 3.1 umol/L — AB (ref >=4.8)
LARGE VLDL-P: 6.4 nmol/L — AB (ref <=2.7)
LDL (calc): 49 mg/dL (ref <100)
LDL Particle Number: 884 nmol/L (ref <1000)
LDL Size: 19.6 nm — ABNORMAL LOW (ref >20.5)
LP-IR Score: 55 — ABNORMAL HIGH (ref <=45)
Small LDL Particle Number: 734 nmol/L — ABNORMAL HIGH (ref <=527)
Triglycerides: 152 mg/dL — ABNORMAL HIGH (ref <150)
VLDL Size: 50.7 nm — ABNORMAL HIGH (ref <=46.6)

## 2013-08-08 LAB — GLUCOSE, CAPILLARY: Glucose-Capillary: 305 mg/dL — ABNORMAL HIGH (ref 70–99)

## 2013-08-08 NOTE — Progress Notes (Addendum)
CBG 305 this morning. No exercise this morning per protocol. Urine negative for ketones. Patient was counseled by the nutritionist. Dr Laurelyn Sickle office called and notified of elevated CBG. Will continue to monitor the patient throughout  the program. Patient instructed to check his blood sugar prior to coming to exercise on Monday. Will fax today's events to Dr Laurelyn Sickle office.

## 2013-08-08 NOTE — Progress Notes (Signed)
Kristopher [name] 63 y.o. male Nutrition Note Spoke with pt.  Nutrition Plan and Nutrition Survey goals reviewed with pt. Pt is following Step 2 of the Therapeutic Lifestyle Changes diet. Pt wants to lose wt. Pt has been trying to lose wt by "following the Atkins diet prior to my heart event." Wt loss tips reviewed.  Pt is diabetic. Pt reports last A1c 8, which pt reports is high for him. Pt states his A1c previously "5.6." Pt checks fasting CBG's daily. CBG's reportedly "in the 200's now and they used to be 120-130 mg/dL." ? If pt may benefit from an increase in his Amaryl. This Probation officer went over Diabetes Education test results. Pt expressed understanding of the information reviewed. Pt aware of nutrition education classes offered.  Nutrition Diagnosis   Food-and nutrition-related knowledge deficit related to lack of exposure to information as related to diagnosis of: ? CVD ? DM   Overweight related to excessive energy intake as evidenced by a BMI of 28.7  Nutrition RX/ Estimated Daily Nutrition Needs for: wt loss  1400-1900 Kcal, 35-50 gm fat, 9-13 gm sat fat, 1.4-1.9 gm trans-fat, <1500 mg sodium, 175-250 gm CHO   Nutrition Intervention   Pt's individual nutrition plan reviewed with pt.   Benefits of adopting Therapeutic Lifestyle Changes discussed when Medficts reviewed.   ? If pt may benefit from increasing Amaryl from 2 mg BID to 4 mg BID   Pt to attend the Portion Distortion class   Pt given handouts for: ? Nutrition I class ? Nutrition II class    Continue client-centered nutrition education by RD, as part of interdisciplinary care. Goal(s)   Pt to identify food quantities necessary to achieve: ? wt loss to a goal wt of 159-177 lb (72.3-80.5 kg) at graduation from cardiac rehab.    CBG concentrations in the normal range or as close to normal as is safely possible. Monitor and Evaluate progress toward nutrition goal with team. Nutrition Risk: Change to Moderate [name], M.Ed, RD, LDN,  CDE 08/08/2013 10:19 AM

## 2013-08-11 ENCOUNTER — Encounter (HOSPITAL_COMMUNITY)
Admission: RE | Admit: 2013-08-11 | Discharge: 2013-08-11 | Disposition: A | Discharge status: Home / Self Care | Payer: 59 | Source: Ambulatory Visit | Attending: Internal Medicine | Admitting: Internal Medicine

## 2013-08-11 ENCOUNTER — Ambulatory Visit (HOSPITAL_COMMUNITY): Payer: 59

## 2013-08-11 ENCOUNTER — Telehealth: Payer: Self-pay | Admitting: *Deleted

## 2013-08-11 LAB — GLUCOSE, CAPILLARY
Glucose-Capillary: 179 mg/dL — ABNORMAL HIGH (ref 70–99)
Glucose-Capillary: 178 mg/dL — ABNORMAL HIGH (ref 70–99)

## 2013-08-11 NOTE — Progress Notes (Signed)
Reviewed home exercise with pt today.  Pt plans to walk and use employee gym for exercise.  Reviewed THR, pulse, RPE, sign and symptoms, NTG use, and when to call 911 or MD.  Pt voiced understanding. Alberteen Sam, MA, ACSM RCEP

## 2013-08-11 NOTE — Telephone Encounter (Signed)
Staff message was sent to me by [facility] @ cardiac rehab. Message forwarded to Dr. Claiborne Billings for review.

## 2013-08-11 NOTE — Progress Notes (Signed)
Quality of life questionnaire faxed to Dr Evette Georges office for review

## 2013-08-11 NOTE — Telephone Encounter (Signed)
Kristopher [name] stated that she wanted to know if you can call her back. This pt had some chest pain and he is taking his nitro every 1-2 days.  TK

## 2013-08-11 NOTE — Progress Notes (Addendum)
Reviewed Steve's quality of life questionnaire this morning after exercise.  Kristopher [name] has low scores in the health and functioning area. Kristopher [name] reports that he is still taking sublingual nitroglycerin every two to three days. Kristopher [name] has not reported having any chest pain at cardiac rehab. Dr Evette Georges office called and notified of patients symptoms. Will fax exercise flow sheets to Dr. Evette Georges office for review.  Kristopher [name] reports he has been experiencing some depression, he is not sure he will be able to return to work. Kristopher [name] is taking an anti depressant. Will continue to monitor the patient throughout  the program.

## 2013-08-12 NOTE — Telephone Encounter (Signed)
Message copied by Magda Kiel on Tue Aug 12, 2013  9:52 AM ------      Message from: Shelva Majestic A      Created: Mon Aug 11, 2013  6:01 PM      Regarding: RE: Chest discomfort       Pt already evaluated in offive on 2/19; please see note. He has had a repeat cath already with patency to stent and will have a nuclear scan. Ok to participate in rehab as tolerates.      ----- Message -----         From: Lauralee Evener, CMA         Sent: 08/11/2013   5:42 PM           To: Troy Sine, MD      Subject: Melton Alar: Chest discomfort                                                 ----- Message -----         From: Magda Kiel, RN         Sent: 08/11/2013  12:20 PM           To: Lauralee Evener, CMA      Subject: Chest discomfort                                         Hello Mariann Laster,            Mr Swider told me this morning that he is still taking sublingual nitroglycerin for chest pain/ discomfort every two to three days since his medications were adjusted at his last office visit. Mr Derksen has not had any complaints of chest pain at cardiac rehab he has completed two sessions if you could let Dr Claiborne Billings know I would appreciate it. I am faxing the exercise flow sheets from cardiac rehab for Dr Claiborne Billings to review.                   Thanks            Verdis Frederickson             ------

## 2013-08-13 ENCOUNTER — Other Ambulatory Visit: Payer: Self-pay | Admitting: *Deleted

## 2013-08-13 ENCOUNTER — Ambulatory Visit (HOSPITAL_COMMUNITY): Payer: 59

## 2013-08-13 ENCOUNTER — Encounter (HOSPITAL_COMMUNITY): Payer: 59

## 2013-08-13 DIAGNOSIS — R7989 Other specified abnormal findings of blood chemistry: Secondary | ICD-10-CM

## 2013-08-13 MED ORDER — ATORVASTATIN CALCIUM 20 MG PO TABS: 20.0000 mg | ORAL_TABLET | Freq: Every day | ORAL | Status: DC

## 2013-08-14 ENCOUNTER — Encounter (HOSPITAL_COMMUNITY): Payer: 59

## 2013-08-15 ENCOUNTER — Ambulatory Visit (HOSPITAL_COMMUNITY): Payer: 59

## 2013-08-15 ENCOUNTER — Encounter (HOSPITAL_COMMUNITY)
Admission: RE | Admit: 2013-08-15 | Discharge: 2013-08-15 | Disposition: A | Discharge status: Home / Self Care | Payer: 59 | Source: Ambulatory Visit | Attending: Internal Medicine | Admitting: Internal Medicine

## 2013-08-15 LAB — GLUCOSE, CAPILLARY: Glucose-Capillary: 235 mg/dL — ABNORMAL HIGH (ref 70–99)

## 2013-08-18 ENCOUNTER — Ambulatory Visit (HOSPITAL_COMMUNITY): Payer: 59

## 2013-08-18 ENCOUNTER — Telehealth (HOSPITAL_COMMUNITY): Payer: Self-pay | Admitting: *Deleted

## 2013-08-18 ENCOUNTER — Encounter (HOSPITAL_COMMUNITY)
Admission: RE | Admit: 2013-08-18 | Discharge: 2013-08-18 | Disposition: A | Discharge status: Home / Self Care | Payer: 59 | Source: Ambulatory Visit | Attending: Internal Medicine | Admitting: Internal Medicine

## 2013-08-18 DIAGNOSIS — I4891 Unspecified atrial fibrillation: Secondary | ICD-10-CM | POA: Insufficient documentation

## 2013-08-18 DIAGNOSIS — I519 Heart disease, unspecified: Secondary | ICD-10-CM | POA: Insufficient documentation

## 2013-08-18 DIAGNOSIS — I2589 Other forms of chronic ischemic heart disease: Secondary | ICD-10-CM | POA: Insufficient documentation

## 2013-08-18 DIAGNOSIS — E785 Hyperlipidemia, unspecified: Secondary | ICD-10-CM | POA: Insufficient documentation

## 2013-08-18 DIAGNOSIS — I200 Unstable angina: Secondary | ICD-10-CM | POA: Insufficient documentation

## 2013-08-18 DIAGNOSIS — I10 Essential (primary) hypertension: Secondary | ICD-10-CM | POA: Insufficient documentation

## 2013-08-18 DIAGNOSIS — I214 Non-ST elevation (NSTEMI) myocardial infarction: Secondary | ICD-10-CM | POA: Insufficient documentation

## 2013-08-18 DIAGNOSIS — I238 Other current complications following acute myocardial infarction: Secondary | ICD-10-CM | POA: Insufficient documentation

## 2013-08-18 DIAGNOSIS — I2510 Atherosclerotic heart disease of native coronary artery without angina pectoris: Secondary | ICD-10-CM | POA: Insufficient documentation

## 2013-08-18 DIAGNOSIS — Z5189 Encounter for other specified aftercare: Secondary | ICD-10-CM | POA: Insufficient documentation

## 2013-08-18 DIAGNOSIS — I319 Disease of pericardium, unspecified: Secondary | ICD-10-CM | POA: Insufficient documentation

## 2013-08-18 LAB — GLUCOSE, CAPILLARY: Glucose-Capillary: 239 mg/dL — ABNORMAL HIGH (ref 70–99)

## 2013-08-20 ENCOUNTER — Encounter (HOSPITAL_COMMUNITY): Admission: RE | Admit: 2013-08-20 | Payer: 59 | Source: Ambulatory Visit

## 2013-08-20 ENCOUNTER — Ambulatory Visit (HOSPITAL_COMMUNITY): Payer: 59

## 2013-08-20 ENCOUNTER — Telehealth: Payer: Self-pay | Admitting: *Deleted

## 2013-08-20 NOTE — Telephone Encounter (Signed)
Faxed cardiac rehab phases II early outpatient program order back.

## 2013-08-22 ENCOUNTER — Ambulatory Visit (HOSPITAL_COMMUNITY): Payer: 59

## 2013-08-22 ENCOUNTER — Encounter (HOSPITAL_COMMUNITY)
Admission: RE | Admit: 2013-08-22 | Discharge: 2013-08-22 | Disposition: A | Discharge status: Home / Self Care | Payer: 59 | Source: Ambulatory Visit | Attending: Internal Medicine | Admitting: Internal Medicine

## 2013-08-25 ENCOUNTER — Encounter (HOSPITAL_COMMUNITY)
Admission: RE | Admit: 2013-08-25 | Discharge: 2013-08-25 | Disposition: A | Discharge status: Home / Self Care | Payer: 59 | Source: Ambulatory Visit | Attending: Internal Medicine | Admitting: Internal Medicine

## 2013-08-25 ENCOUNTER — Ambulatory Visit (HOSPITAL_COMMUNITY): Payer: 59

## 2013-08-26 ENCOUNTER — Other Ambulatory Visit: Payer: Self-pay | Admitting: *Deleted

## 2013-08-26 ENCOUNTER — Encounter (HOSPITAL_COMMUNITY): Payer: Self-pay | Admitting: Pharmacy Technician

## 2013-08-26 ENCOUNTER — Ambulatory Visit (HOSPITAL_COMMUNITY)
Admission: RE | Admit: 2013-08-26 | Discharge: 2013-08-26 | Disposition: A | Discharge status: Home / Self Care | Payer: 59 | Source: Ambulatory Visit | Attending: Cardiovascular Disease | Admitting: Cardiovascular Disease

## 2013-08-26 ENCOUNTER — Encounter: Payer: Self-pay | Admitting: Cardiovascular Disease

## 2013-08-26 ENCOUNTER — Ambulatory Visit (INDEPENDENT_AMBULATORY_CARE_PROVIDER_SITE_OTHER): Payer: 59 | Admitting: Cardiovascular Disease

## 2013-08-26 VITALS — BP 110/78 | HR 84 | Ht 67.0 in | Wt 183.0 lb

## 2013-08-26 DIAGNOSIS — I10 Essential (primary) hypertension: Secondary | ICD-10-CM

## 2013-08-26 DIAGNOSIS — E785 Hyperlipidemia, unspecified: Secondary | ICD-10-CM

## 2013-08-26 DIAGNOSIS — R079 Chest pain, unspecified: Secondary | ICD-10-CM | POA: Insufficient documentation

## 2013-08-26 DIAGNOSIS — I255 Ischemic cardiomyopathy: Secondary | ICD-10-CM

## 2013-08-26 DIAGNOSIS — R9430 Abnormal result of cardiovascular function study, unspecified: Secondary | ICD-10-CM | POA: Insufficient documentation

## 2013-08-26 DIAGNOSIS — I2589 Other forms of chronic ischemic heart disease: Secondary | ICD-10-CM

## 2013-08-26 DIAGNOSIS — I2510 Atherosclerotic heart disease of native coronary artery without angina pectoris: Secondary | ICD-10-CM | POA: Insufficient documentation

## 2013-08-26 DIAGNOSIS — Z01818 Encounter for other preprocedural examination: Secondary | ICD-10-CM

## 2013-08-26 DIAGNOSIS — R0789 Other chest pain: Secondary | ICD-10-CM

## 2013-08-26 DIAGNOSIS — I3139 Other pericardial effusion (noninflammatory): Secondary | ICD-10-CM

## 2013-08-26 DIAGNOSIS — F329 Major depressive disorder, single episode, unspecified: Secondary | ICD-10-CM

## 2013-08-26 DIAGNOSIS — I214 Non-ST elevation (NSTEMI) myocardial infarction: Secondary | ICD-10-CM

## 2013-08-26 DIAGNOSIS — I319 Disease of pericardium, unspecified: Secondary | ICD-10-CM

## 2013-08-26 DIAGNOSIS — I313 Pericardial effusion (noninflammatory): Secondary | ICD-10-CM

## 2013-08-26 DIAGNOSIS — Z01810 Encounter for preprocedural cardiovascular examination: Principal | ICD-10-CM

## 2013-08-26 DIAGNOSIS — F32A Depression, unspecified: Secondary | ICD-10-CM

## 2013-08-26 DIAGNOSIS — D689 Coagulation defect, unspecified: Secondary | ICD-10-CM

## 2013-08-26 DIAGNOSIS — F3289 Other specified depressive episodes: Secondary | ICD-10-CM

## 2013-08-26 DIAGNOSIS — I219 Acute myocardial infarction, unspecified: Secondary | ICD-10-CM

## 2013-08-26 DIAGNOSIS — Z79899 Other long term (current) drug therapy: Secondary | ICD-10-CM

## 2013-08-26 LAB — CBC
HCT: 39.6 % (ref 39.0–52.0)
HEMOGLOBIN: 14 g/dL (ref 13.0–17.0)
MCH: 33.8 pg (ref 26.0–34.0)
MCHC: 35.4 g/dL (ref 30.0–36.0)
MCV: 95.7 fL (ref 78.0–100.0)
PLATELETS: 240 10*3/uL (ref 150–400)
RBC: 4.14 MIL/uL — ABNORMAL LOW (ref 4.22–5.81)
RDW: 14.4 % (ref 11.5–15.5)
WBC: 6 10*3/uL (ref 4.0–10.5)

## 2013-08-26 MED ORDER — TECHNETIUM TC 99M SESTAMIBI GENERIC - CARDIOLITE
30.0000 | Freq: Once | INTRAVENOUS | Status: AC | PRN
  Administered 2013-08-26: 30 via INTRAVENOUS

## 2013-08-26 MED ORDER — ISOSORBIDE MONONITRATE ER 60 MG PO TB24: 60.0000 mg | ORAL_TABLET | Freq: Every day | ORAL | Status: DC

## 2013-08-26 MED ORDER — RANOLAZINE ER 500 MG PO TB12: 500.0000 mg | ORAL_TABLET | Freq: Two times a day (BID) | ORAL | Status: DC

## 2013-08-26 MED ORDER — TECHNETIUM TC 99M SESTAMIBI GENERIC - CARDIOLITE
10.0000 | Freq: Once | INTRAVENOUS | Status: AC | PRN
  Administered 2013-08-26: 10 via INTRAVENOUS

## 2013-08-26 NOTE — Progress Notes (Addendum)
Patient ID: Kristopher [name], male   DOB: Nov 16, 1950, 63 y.o.   MRN: [id number]       HPI: Kristopher [name] is a 63 y.o. male  is seen in the office today as an add-on following his exercise Myoview study after he developed significant chest pain and a hypertensive blood pressure response to exercise.  [name] is a 63 year old male with a history of type 2 diabetes mellitus, hypertension, atrial fibrillation who is status post ablation, as well as small chronic pericardial effusion documented for at least 7 years. He presented to [facility] on 06/27/2013 with chest pain radiating to his upper extremity and back. Cardiac markers were positive with a troponin of 7.54 compatible with NSTEMI. He underwent acute catheterization by me on 06/27/2013 and was found to have high-grade diffuse 90+ % distal RCA stenoses beyond the acute margin and proximal to the PDA takeoff  and mild 10 at 20% narrowing in the LAD with a normal circumflex vessel. EF 45-50%. He underwent successful stenting of his distal RCA and a Xience Alpine DES stent 3.0x23 mm was inserted and post dilated to 3.25 mm. He was treated with aspirin and Brilinta for dual antiplatelet therapy. His troponin peaked at 20.  Post discharge, he developed recurrent chest pain leading to readmission and he underwent repeat cardiac catheterization by Dr. Shon Hough on 07/10/13. His RCA stent was widely patent and ejection fraction was 40%.He later saw [name], PAC on 07/17/13 and his pulse was 100. His beta blocker was increased from Toprol-XL 25 to 50 mg. He has experienced some episodes of chest discomfort since that time. He presented to cardiac rehabilitation 2 weeks ago and because of his recent chest pain they did not feel comfortable having him exercise and saw me in our office on 07/29/13.   He has  a chronic peicardial effusion since at least 2008. An echo Doppler study done on 06/27/2013 revealed an ejection fraction of 40% with grade 2 diastolic  dysfunction. A small circumferential paracardial effusion with no evidence for temp and was again demonstrated.  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 He states that his wife seems to notice abnormal breathing during sleep with apparent possible Cheyne-Stokes respirations. She checked his oxygen saturation this did drop to 82%. When I saw him, I recommended that he undergo a sleep study. This has not yet been done but is scheduled for 09/01/2013.  Patient has continued to experience mild episodes of chest pressure. Oftentimes these are nonexertional. He did take sublingual nitroglycerin and felt improvement in approximately 5 minutes. He denies exertional chest pain. When I recently saw him I further titrate his Toprol to 100 mg daily. At the time of that evaluation he did have an increased pulse rate with ventricular trigeminy. He feels his pulse is more stable and he is not aware of any extra heartbeats.  When I last saw him, because of the repeat his recurrent episodes of chest pain I have recommended that he undergo an exercise Myoview study to make certain he has not developed any subsequent ischemia. He presented for this study today. His resting blood pressure was 125/81 and heart rate in the eighth. Rest nuclear images were obtained. He does size on the Bruce protocol total of 7 minutes and 24 seconds. During stage III of exercise he developed increasing shortness of breath chest tightness which he characterized as 6/10 wheezing and felt he could not go any further and therefore stopped. At that point, he  was less than 80% predicted maximal heart rate and consequently was not injected with his stress pharmaceutical dose. He had mild galloping of his ST segments at rest and these increased to slightly 0.5 to less than 1% at peak stress. However, his blood pressure dropped to 87/50 with this episode of chest tightness. Because of this abnormal response he is now seen by me in the office. He  currently is pain-free.  Past Medical History  Diagnosis Date  . Herniated cervical disc     c2-T1 fusion  . Chronic pain     back pain  . Atrial fibrillation     s/p PVI X2 by Dr Omelia Blackwater at [facility] around 2000  . NSTEMI (non-ST elevated myocardial infarction) 06-27-2013  . CAD (coronary artery disease)     a. NSTEMI (06/2013):  LHC (06/27/2013):  pLAD 10-20, RCA 90, EF 45-50%, inf HK.  PCI:  Xience Alpine (3 x 23 mm) DES to the RCA.  . Ischemic cardiomyopathy     a. Echo (06/28/2011): Inferior and inferoseptal akinesis, EF 67%, grade 2 diastolic dysfunction, MAC, small effusion (no tamponade)  . HTN (hypertension)   . HLD (hyperlipidemia)   . Pericardial effusion     chronic since hx of pericarditis in 2004  . Anginal pain   . Depression   . Shortness of breath   . Diabetes mellitus without complication     TYPE 2  . Arthritis     SPINE    OSTEO    Past Surgical History  Procedure Laterality Date  . Back surgery      fusion c3-t1  . Ablation  2000    PVI X2 by Dr Omelia Blackwater at [facility]  . Appendectomy    . Tonsillectomy      Allergies  Allergen Reactions  . Codeine Nausea And Vomiting  . Other Nausea And Vomiting    The patient states that he has had  a reaction to all narcotics. He can take them though.    Current Outpatient Prescriptions  Medication Sig Dispense Refill  . Ascorbic Acid (VITAMIN C) 1000 MG tablet Take 1,000 mg by mouth daily.      . B Complex-Biotin-FA (VITAMIN B50 COMPLEX PO) Take 1 capsule by mouth daily.      . baclofen (LIORESAL) 10 MG tablet Take 10 mg by mouth 3 (three) times daily.      . Canagliflozin (INVOKANA) 300 MG TABS Take 300 mg by mouth daily.      . cholecalciferol (VITAMIN D) 1000 UNITS tablet Take 1,000 Units by mouth daily.      . cyclobenzaprine (FLEXERIL) 10 MG tablet Take 10 mg by mouth 3 (three) times daily as needed for muscle spasms.      . fenofibrate 160 MG tablet Take 160 mg by mouth daily.      Marland Kitchen gabapentin (NEURONTIN) 300 MG  capsule Take 300-600 mg by mouth 3 (three) times daily. Take 361m in the morning, 3050min the afternoon, and 60039mt bedtime.      . gMarland Kitchenimepiride (AMARYL) 2 MG tablet Take 2 mg by mouth 2 (two) times daily.       . aMarland Kitchenpirin EC 81 MG tablet Take 81 mg by mouth daily.      . aMarland Kitchenorvastatin (LIPITOR) 20 MG tablet Take 20 mg by mouth daily.      . bMarland KitchenPROPion (WELLBUTRIN XL) 150 MG 24 hr tablet Take 150 mg by mouth daily.      . colchicine 0.6 MG tablet  Take 0.6 mg by mouth 2 (two) times daily.      . isosorbide mononitrate (IMDUR) 60 MG 24 hr tablet Take 60 mg by mouth daily.      . metoprolol succinate (TOPROL-XL) 100 MG 24 hr tablet Take 100 mg by mouth daily. Take with or immediately following a meal.      . nitroGLYCERIN (NITROSTAT) 0.4 MG SL tablet Place 0.4 mg under the tongue every 5 (five) minutes as needed for chest pain.      . ranolazine (RANEXA) 500 MG 12 hr tablet Take 500 mg by mouth 2 (two) times daily.      . sitaGLIPtin-metformin (JANUMET) 50-500 MG per tablet Take 1 tablet by mouth 2 (two) times daily with a meal.      . Ticagrelor (BRILINTA) 90 MG TABS tablet Take 90 mg by mouth 2 (two) times daily.       No current facility-administered medications for this visit.    History   Social History  . Marital Status: Married    Spouse Name: N/A    Number of Children: N/A  . Years of Education: N/A   Occupational History  . Not on file.   Social History Main Topics  . Smoking status: Former Smoker    Quit date: 05/06/2007  . Smokeless tobacco: Never Used  . Alcohol Use: Yes     Comment: rarely  . Drug Use: No  . Sexual Activity: Yes   Other Topics Concern  . Not on file   Social History Narrative  . No narrative on file   Socially he is married. 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 There is no tobacco use.  History reviewed. No pertinent family history.  ROS is negative for fevers, chills or night sweats. He denies  headaches. He denies change in vision or hearing. He denies wheezing. He does note at times while shortness of breath. He denies PND or orthopnea. He has expressed recurrent episodes of chest discomfort described as tightness. He denies nausea vomiting or diarrhea. He's aware blood in stool or urine. He denies myalgias. He denies claudication symptoms. He is diabetic. He does have hyperlipidemia.  His sleep breathing is abnormal. He does have history of depression for which he has been on Wellbutrin. Other comprehensive 14 point system review is negative.  PE BP 110/78  Pulse 84  Ht 5' 7"  (1.702 m)  Wt 183 lb (83.008 kg)  BMI 28.66 kg/m2  General: Alert, oriented, no distress.  Skin: normal turgor, no rashes HEENT: Normocephalic, atraumatic. Pupils round and reactive; sclera anicteric;no lid lag. Extraocular muscles intact. No Nose without nasal septal hypertrophy Mouth/Parynx benign; Mallinpatti scale 3 Neck: No JVD, no carotid bruits; normal carotid upstroke Lungs: clear to ausculatation and percussion; no wheezing or rales Chest wall: no tenderness to palpitation Heart:  trigeminal pattern rhythm, s1 s2 normal no S3 or S4 gallop. No rub. Over 6 systolic murmur to Abdomen: soft, nontender; no hepatosplenomehaly, BS+; abdominal aorta nontender and not dilated by palpation. Back: no CVA tenderness Pulses 2+ right femoral and right radial cath sites well healed  Extremities: no clubbing cyanosis or edema, Homan's sign negative  Neurologic: grossly nonfocal; cranial nerves grossly normal. Psychologic: normal affect and mood.  Recent ECG of 08/07/2013  (independently read by me): Sinus rhythm at 86; no ectopy  Prior ECG of 07/29/2013 (independently read by me): Underlying sinus rhythm with ventricular trigeminy. Sinus rate 97 beats per minute. A small Q  wave in lead 3.  LABS:  BMET    Component Value Date/Time   NA 139 08/07/2013 0857   K 4.0 08/07/2013 0857   CL 100 08/07/2013 0857   CO2  28 08/07/2013 0857   GLUCOSE 218* 08/07/2013 0857   BUN 20 08/07/2013 0857   CREATININE 0.98 08/07/2013 0857   CREATININE 0.91 07/10/2013 0625   CALCIUM 8.8 08/07/2013 0857   GFRNONAA >90 07/10/2013 0625   GFRAA >90 07/10/2013 0625     Hepatic Function Panel     Component Value Date/Time   PROT 6.4 08/07/2013 0857   ALBUMIN 4.4 08/07/2013 0857   AST 44* 08/07/2013 0857   ALT 75* 08/07/2013 0857   ALKPHOS 72 08/07/2013 0857   BILITOT 0.8 08/07/2013 0857     CBC    Component Value Date/Time   WBC 5.4 08/07/2013 0857   RBC 3.83* 08/07/2013 0857   HGB 13.2 08/07/2013 0857   HCT 36.2* 08/07/2013 0857   PLT 205 08/07/2013 0857   MCV 94.5 08/07/2013 0857   MCH 34.5* 08/07/2013 0857   MCHC 36.5* 08/07/2013 0857   RDW 13.8 08/07/2013 0857   LYMPHSABS 1.3 07/09/2013 1355   MONOABS 0.5 07/09/2013 1355   EOSABS 0.2 07/09/2013 1355   BASOSABS 0.0 07/09/2013 1355     BNP No results found for this basename: probnp    Lipid Panel     Component Value Date/Time   CHOL 175 06/27/2013 0630   TRIG 152* 08/07/2013 0857   TRIG 126 06/27/2013 0630   HDL 31* 06/27/2013 0630   CHOLHDL 5.6 06/27/2013 0630   VLDL 25 06/27/2013 0630   LDLCALC 49 08/07/2013 0857   LDLCALC 119* 06/27/2013 0630     RADIOLOGY: No results found.    ASSESSMENT AND PLAN: Mr. Abdulhamid Olgin is a 63 year old gentleman with diabetes mellitus for at least 7 years, a history of hyperlipidemia, who suffered a recent non-ST segment elevation myocardial infarction secondary to high-grade distal RCA disease which was successfully stented on 06/27/2013. Repeat catheterization for recurrent chest pain showed the stent to be widely patent. He does have mild LV dysfunction. Since I last saw him, I had further titrate his Toprol to 75 mg and then 100 mg. Review of his rest images today on his nuclear perfusion study shows fairly normal perfusion with mild thinning in the basal inferior wall. On a exercise treadmill portion of the test, he developed 6/10 chest  pain associated with marked shortness of breath, wheezing, and had an abnormal blood pressure response were dropped to 87/50. Because the test was stopped prematurely he never was reinjected for his stress imaging. Based on his abnormal exercise test today, it is my recommendation that definitive repeat cardiac catheterization be performed. I've provided him with samples of Ranexa 500 mg twice a day which will be added  to his current medical regimen. I did review his recent laboratory work including his NMR profile which demonstrated an LDL particle #84 LDL calculated was 149 total claustral 106 and HDL very low at 27. He did have very minimal ALT elevation at 75 and AST elevation at 44 and for this reason had held his atorvastatin for the last week. Repeat laboratory will be obtained today in anticipation of his cardiac catheterization. Cardiac [facility] He is here with his wife who both agree to proceed with this definitive reassessment of his coronary anatomy.   Troy Sine, MD, [facility]  08/26/2013 5:37 PM

## 2013-08-26 NOTE — Procedures (Addendum)
Kristopher [name] [phone]  Cardiology Nuclear Med Study  Kristopher [name] is a 63 y.o. male     MRN : [id number]     DOB: May 19, 1951  Procedure Date: 08/26/2013  Nuclear Med Background Indication for Stress Test:  Stent Patency and [facility] History:  CAD;MI-06/27/2013;STENT/PTCA-06/27/2013;ICM;AFIB s/p ablation Cardiac Risk Factors: History of Smoking, Hypertension, Lipids, NIDDM and Overweight  Symptoms:  Chest Pain, DOE, Fatigue, Light-Headedness, Near Syncope, Palpitations and SOB   Nuclear Pre-Procedure Caffeine/Decaff Intake:  9:00pm NPO After: 7:00am   IV Site: R Forearm  IV 0.9% NS with Angio Cath:  22g  Chest Size (in):  42"  IV Started by: Azucena Cecil, RN  Height: 5' 7"  (1.702 m)  Cup Size: n/a  BMI:  Body mass index is 28.66 kg/(m^2). Weight:  183 lb (83.008 kg)   Tech Comments: Pt could not reach target heart rate and exhibited chest pain,sob, BP drop.  Dr. Claiborne Billings chose to discontinue test at this point and to see patient.    Nuclear Med Study 1 or 2 day study: 1 day  Stress Test Type:  Stress  Order Authorizing Provider:  Shelva Majestic, MD   Resting Radionuclide: Technetium 15mSestamibi  Resting Radionuclide Dose: 10.5 mCi   Stress Radionuclide:  Technetium 927mestamibi  Stress Radionuclide Dose: not given mCi           Stress Protocol Rest HR: 88 Stress HR: 123  Rest BP: 125/81 Stress BP: 124/87  Exercise Time (min): 7:24 METS: 9.1   Predicted Max HR: 160 bpm % Max HR: 76.88 bpm Rate Pressure Product: 17466  Dose of Adenosine (mg):  n/a Dose of Lexiscan: n/a mg  Dose of Atropine (mg): n/a Dose of Dobutamine: n/a mcg/kg/min (at max HR)  Stress Test Technologist: TeLeane ParaCCT Nuclear Technologist: RoOtho PerlCNMT   Rest Procedure:  Myocardial perfusion imaging was performed at rest 45 minutes following the intravenous administration of Technetium 9969mestamibi. Stress Procedure:  The patient performed treadmill exercise using a Bruce Protocol for 7:24 minutes. The patient stopped due to marked SOB, Nausea and Chest Tightness, a 6 out of 1-10.  There were ST-T changes.  Technetium of 66m5m not injected due to patient not able to obtain target heart rate. The patient experienced drop in blood pressure, of 87/50,  at peak exercise and immediate recovery. The study was taken to Dr. ThomShelva Majesticconsultation. Decision not to proceed was made per Dr. KellClaiborne Billingsransient Ischemic Dilatation (Normal <1.22):n/a Lung/Heart Ratio (Normal <0.45): n/a QGS EDV:  n/a ml QGS ESV: n/a ml LV Ejection Fraction: Study not gated  Signed by     Rest ECG: NSR with isolated PVC and mild scalloping of ST segments inferiorly  Stress ECG: Mild additional nondiagnostic ST changes inferiorly   QPS Raw Data Images:  Normal; no motion artifact; normal heart/lung ratio. Stress Images:  Not done Rest Images:  Mild mid to basal inferiro thinning. Subtraction (SDS):  Not done  Impression Exercise Capacity:  Good exercise capacity. BP Response:  Hypotensive blood pressure response. Clinical Symptoms:  Patient developed 6/10 chest pain with dyspnea, nausea and with an abnormal BP reponse. ECG Impression:  Insignificant upsloping ST segment depression. Comparison with Prior Nuclear Study: No images to compare  Overall Impression:  Abnormal Exercise study with patient developing significant chest pain, dyspnea and an abnormal hypotensive BP response immediately post exercise. Rest images  reveal mid to basal inferior thinning. Stress images were not able to be obtained due to the abrupt discontinuance of the study prior to achieving and adequate HR response.  LV Wall Motion:  Not done   Troy Sine, MD  08/26/2013 5:59 PM

## 2013-08-26 NOTE — Patient Instructions (Addendum)
Your physician has requested that you have a cardiac catheterization. Cardiac catheterization is used to diagnose and/or treat various heart conditions. Doctors may recommend this procedure for a number of different reasons. The most common reason is to evaluate chest pain. Chest pain can be a symptom of coronary artery disease (CAD), and cardiac catheterization can show whether plaque is narrowing or blocking your heart's arteries. This procedure is also used to evaluate the valves, as well as measure the blood flow and oxygen levels in different parts of your heart. For further information please visit [email or web address]. Please follow instruction sheet, as given. This will  Be done on Thursday March 12th.  Your physician recommends that you get your procedure for lab work TODAY.   Your physician recommends that you schedule a follow-up appointment in: [address] [place]. This will be scheduled with Dr. Claiborne Billings or an extender.

## 2013-08-27 ENCOUNTER — Ambulatory Visit (HOSPITAL_COMMUNITY): Payer: 59

## 2013-08-27 ENCOUNTER — Encounter (HOSPITAL_COMMUNITY): Payer: 59

## 2013-08-27 ENCOUNTER — Telehealth (HOSPITAL_COMMUNITY): Payer: Self-pay

## 2013-08-27 LAB — APTT: aPTT: 26 seconds (ref 24–37)

## 2013-08-27 LAB — BASIC METABOLIC PANEL
BUN: 20 mg/dL (ref 6–23)
CO2: 25 mEq/L (ref 19–32)
Calcium: 9.2 mg/dL (ref 8.4–10.5)
Chloride: 103 mEq/L (ref 96–112)
Creat: 1.05 mg/dL (ref 0.50–1.35)
Glucose, Bld: 152 mg/dL — ABNORMAL HIGH (ref 70–99)
POTASSIUM: 4.2 meq/L (ref 3.5–5.3)
SODIUM: 139 meq/L (ref 135–145)

## 2013-08-27 LAB — PROTIME-INR
INR: 0.91 (ref <1.50)
PROTHROMBIN TIME: 12.2 s (ref 11.6–15.2)

## 2013-08-28 ENCOUNTER — Encounter (HOSPITAL_COMMUNITY)
Admission: RE | Disposition: A | Discharge status: Home / Self Care | Payer: Self-pay | Source: Ambulatory Visit | Attending: Cardiovascular Disease

## 2013-08-28 ENCOUNTER — Encounter: Payer: Self-pay | Admitting: Cardiovascular Disease

## 2013-08-28 ENCOUNTER — Ambulatory Visit (HOSPITAL_COMMUNITY)
Admission: RE | Admit: 2013-08-28 | Discharge: 2013-08-28 | Disposition: A | Discharge status: Home / Self Care | Payer: 59 | Source: Ambulatory Visit | Attending: Cardiovascular Disease | Admitting: Cardiovascular Disease

## 2013-08-28 DIAGNOSIS — I219 Acute myocardial infarction, unspecified: Secondary | ICD-10-CM

## 2013-08-28 DIAGNOSIS — M479 Spondylosis, unspecified: Secondary | ICD-10-CM | POA: Insufficient documentation

## 2013-08-28 DIAGNOSIS — Z01810 Encounter for preprocedural cardiovascular examination: Secondary | ICD-10-CM

## 2013-08-28 DIAGNOSIS — F329 Major depressive disorder, single episode, unspecified: Secondary | ICD-10-CM | POA: Insufficient documentation

## 2013-08-28 DIAGNOSIS — I318 Other specified diseases of pericardium: Secondary | ICD-10-CM | POA: Insufficient documentation

## 2013-08-28 DIAGNOSIS — Z9861 Coronary angioplasty status: Secondary | ICD-10-CM | POA: Insufficient documentation

## 2013-08-28 DIAGNOSIS — G8929 Other chronic pain: Secondary | ICD-10-CM | POA: Insufficient documentation

## 2013-08-28 DIAGNOSIS — R079 Chest pain, unspecified: Secondary | ICD-10-CM | POA: Insufficient documentation

## 2013-08-28 DIAGNOSIS — E119 Type 2 diabetes mellitus without complications: Secondary | ICD-10-CM | POA: Insufficient documentation

## 2013-08-28 DIAGNOSIS — I252 Old myocardial infarction: Secondary | ICD-10-CM | POA: Insufficient documentation

## 2013-08-28 DIAGNOSIS — Z7982 Long term (current) use of aspirin: Secondary | ICD-10-CM | POA: Insufficient documentation

## 2013-08-28 DIAGNOSIS — E785 Hyperlipidemia, unspecified: Secondary | ICD-10-CM | POA: Insufficient documentation

## 2013-08-28 DIAGNOSIS — I10 Essential (primary) hypertension: Secondary | ICD-10-CM | POA: Insufficient documentation

## 2013-08-28 DIAGNOSIS — I2510 Atherosclerotic heart disease of native coronary artery without angina pectoris: Secondary | ICD-10-CM | POA: Insufficient documentation

## 2013-08-28 DIAGNOSIS — F3289 Other specified depressive episodes: Secondary | ICD-10-CM | POA: Insufficient documentation

## 2013-08-28 DIAGNOSIS — I2589 Other forms of chronic ischemic heart disease: Secondary | ICD-10-CM | POA: Insufficient documentation

## 2013-08-28 HISTORY — PX: LEFT HEART CATHETERIZATION WITH CORONARY ANGIOGRAM: SHX5451

## 2013-08-28 LAB — GLUCOSE, CAPILLARY
Glucose-Capillary: 163 mg/dL — ABNORMAL HIGH (ref 70–99)
Glucose-Capillary: 107 mg/dL — ABNORMAL HIGH (ref 70–99)

## 2013-08-28 SURGERY — LEFT HEART CATHETERIZATION WITH CORONARY ANGIOGRAM
Anesthesia: LOCAL

## 2013-08-28 MED ORDER — ONDANSETRON HCL 4 MG/2ML IJ SOLN: 4.0000 mg | Freq: Once | INTRAMUSCULAR | Status: DC

## 2013-08-28 MED ORDER — SODIUM CHLORIDE 0.9 % IV SOLN
250.0000 mL | INTRAVENOUS | Status: DC | PRN
Start: 2013-08-28 — End: 2013-08-28

## 2013-08-28 MED ORDER — HEPARIN (PORCINE) IN NACL 2-0.9 UNIT/ML-% IJ SOLN
INTRAMUSCULAR | Status: AC
  Filled 2013-08-28: qty 1000

## 2013-08-28 MED ORDER — NITROGLYCERIN 0.2 MG/ML ON CALL CATH LAB
INTRAVENOUS | Status: AC
  Filled 2013-08-28: qty 1

## 2013-08-28 MED ORDER — MIDAZOLAM HCL 2 MG/2ML IJ SOLN
INTRAMUSCULAR | Status: AC
  Filled 2013-08-28: qty 2

## 2013-08-28 MED ORDER — SODIUM CHLORIDE 0.9 % IV SOLN
INTRAVENOUS | Status: DC
  Administered 2013-08-28: 13:00:00 via INTRAVENOUS

## 2013-08-28 MED ORDER — DIAZEPAM 5 MG PO TABS
5.0000 mg | ORAL_TABLET | ORAL | Status: AC
  Administered 2013-08-28: 5 mg via ORAL
  Filled 2013-08-28: qty 1

## 2013-08-28 MED ORDER — SODIUM CHLORIDE 0.9 % IJ SOLN: 3.0000 mL | Freq: Two times a day (BID) | INTRAMUSCULAR | Status: DC

## 2013-08-28 MED ORDER — SODIUM CHLORIDE 0.9 % IJ SOLN: 3.0000 mL | INTRAMUSCULAR | Status: DC | PRN

## 2013-08-28 MED ORDER — ASPIRIN 81 MG PO CHEW
81.0000 mg | CHEWABLE_TABLET | ORAL | Status: AC
  Administered 2013-08-28: 81 mg via ORAL
  Filled 2013-08-28: qty 1

## 2013-08-28 MED ORDER — LIDOCAINE HCL (PF) 1 % IJ SOLN
INTRAMUSCULAR | Status: AC
  Filled 2013-08-28: qty 30

## 2013-08-28 MED ORDER — SODIUM CHLORIDE 0.9 % IV SOLN
INTRAVENOUS | Status: DC
Start: 2013-08-28 — End: 2013-08-28

## 2013-08-28 NOTE — Progress Notes (Signed)
Called Dr. Claiborne Billings to inform him of patient's symptoms; low BP, sweating, and feeling nauseated.  Dr. Claiborne Billings gave orders to increase IVF and give IV Zofran (although patient's wife requested Phenergan and Reglan) if still needed after IVF increased.  After checking on the patient, patient stated that he felt much better.  IVF increased from 75cc/hr to 150cc/hr.  Continued to monitor patient until taken to cath lab.

## 2013-08-28 NOTE — Discharge Instructions (Signed)
Angiography, Care After °Refer to this sheet in the next few weeks. These instructions provide you with information on caring for yourself after your procedure. Your health care provider may also give you more specific instructions. Your treatment has been planned according to current medical practices, but problems sometimes occur. Call your health care provider if you have any problems or questions after your procedure.  °WHAT TO EXPECT AFTER THE PROCEDURE °After your procedure, it is typical to have the following sensations: °· Minor discomfort or tenderness and a small bump at the catheter insertion site. The bump should usually decrease in size and tenderness within 1 to 2 weeks. °· Any bruising will usually fade within 2 to 4 weeks. °HOME CARE INSTRUCTIONS  °· You may need to keep taking blood thinners if they were prescribed for you. Only take over-the-counter or prescription medicines for pain, fever, or discomfort as directed by your health care provider. °· Do not apply powder or lotion to the site. °· Do not sit in a bathtub, swimming pool, or whirlpool for 5 to 7 days. °· You may shower 24 hours after the procedure. Remove the bandage (dressing) and gently wash the site with plain soap and water. Gently pat the site dry. °· Inspect the site at least twice daily. °· Limit your activity for the first 24 hours. Do not bend, squat, or lift anything over 10 lb or as directed by your health care provider. °· Do not drive home if you are discharged the day of the procedure. Have someone else drive you. Follow instructions about when you can drive or return to work. °SEEK MEDICAL CARE IF: °· You get lightheaded when standing up. °· You have drainage (other than a small amount of blood on the dressing). °· You have chills. °· You have a fever. °· You have redness, warmth, swelling, or pain at the insertion site. °SEEK IMMEDIATE MEDICAL CARE IF:  °· You develop chest pain or shortness of breath, feel faint, or  pass out. °· You have bleeding, swelling larger than a walnut, or drainage from the catheter insertion site. °· You develop pain, discoloration, coldness, or severe bruising in the leg or arm that held the catheter. °· You develop bleeding from any other place, such as the bowels. You may see bright red blood in your urine or stools, or your stools may appear black and tarry. °· You have heavy bleeding from the site. If this happens, hold pressure on the site. °MAKE SURE YOU: °· Understand these instructions. °· Will watch your condition. °· Will get help right away if you are not doing well or get worse. °Document Released: 12/22/2004 Document Revised: 02/05/2013 Document Reviewed: 10/28/2012 °[employer]® Patient Information ©2014 [employer]. ° °

## 2013-08-28 NOTE — H&P (View-Only) (Signed)
Patient ID: Kristopher [name], male   DOB: 12/07/1950, 63 y.o.   MRN: [id number]       HPI: Kristopher [name] is a 63 y.o. male  is seen in the office today as an add-on following his exercise Myoview study after he developed significant chest pain and a hypertensive blood pressure response to exercise.  Kristopher [name] is a 63 year old male with a history of type 2 diabetes mellitus, hypertension, atrial fibrillation who is status post ablation, as well as small chronic pericardial effusion documented for at least 7 years. He presented to [facility] on 06/27/2013 with chest pain radiating to his upper extremity and back. Cardiac markers were positive with a troponin of 7.54 compatible with NSTEMI. He underwent acute catheterization by me on 06/27/2013 and was found to have high-grade diffuse 90+ % distal RCA stenoses beyond the acute margin and proximal to the PDA takeoff  and mild 10 at 20% narrowing in the LAD with a normal circumflex vessel. EF 45-50%. He underwent successful stenting of his distal RCA and a Xience Alpine DES stent 3.0x23 mm was inserted and post dilated to 3.25 mm. He was treated with aspirin and Brilinta for dual antiplatelet therapy. His troponin peaked at 20.  Post discharge, he developed recurrent chest pain leading to readmission and he underwent repeat cardiac catheterization by Dr. Shon Hough on 07/10/13. His RCA stent was widely patent and ejection fraction was 40%.He later saw Kristopher [name], PAC on 07/17/13 and his pulse was 100. His beta blocker was increased from Toprol-XL 25 to 50 mg. He has experienced some episodes of chest discomfort since that time. He presented to cardiac rehabilitation 2 weeks ago and because of his recent chest pain they did not feel comfortable having him exercise and saw me in our office on 07/29/13.   He has  a chronic peicardial effusion since at least 2008. An echo Doppler study done on 06/27/2013 revealed an ejection fraction of 40% with grade 2 diastolic  dysfunction. A small circumferential paracardial effusion with no evidence for temp and was again demonstrated.  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 He states that his wife seems to notice abnormal breathing during sleep with apparent possible Cheyne-Stokes respirations. She checked his oxygen saturation this did drop to 82%. When I saw him, I recommended that he undergo a sleep study. This has not yet been done but is scheduled for 09/01/2013.  Patient has continued to experience mild episodes of chest pressure. Oftentimes these are nonexertional. He did take sublingual nitroglycerin and felt improvement in approximately 5 minutes. He denies exertional chest pain. When I recently saw him I further titrate his Toprol to 100 mg daily. At the time of that evaluation he did have an increased pulse rate with ventricular trigeminy. He feels his pulse is more stable and he is not aware of any extra heartbeats.  When I last saw him, because of the repeat his recurrent episodes of chest pain I have recommended that he undergo an exercise Myoview study to make certain he has not developed any subsequent ischemia. He presented for this study today. His resting blood pressure was 125/81 and heart rate in the eighth. Rest nuclear images were obtained. He does size on the Bruce protocol total of 7 minutes and 24 seconds. During stage III of exercise he developed increasing shortness of breath chest tightness which he characterized as 6/10 wheezing and felt he could not go any further and therefore stopped. At that point, he  was less than 80% predicted maximal heart rate and consequently was not injected with his stress pharmaceutical dose. He had mild galloping of his ST segments at rest and these increased to slightly 0.5 to less than 1% at peak stress. However, his blood pressure dropped to 87/50 with this episode of chest tightness. Because of this abnormal response he is now seen by me in the office. He  currently is pain-free.  Past Medical History  Diagnosis Date  . Herniated cervical disc     c2-T1 fusion  . Chronic pain     back pain  . Atrial fibrillation     s/p PVI X2 by Dr Omelia Blackwater at [facility] around 2000  . NSTEMI (non-ST elevated myocardial infarction) 06-27-2013  . CAD (coronary artery disease)     a. NSTEMI (06/2013):  LHC (06/27/2013):  pLAD 10-20, RCA 90, EF 45-50%, inf HK.  PCI:  Xience Alpine (3 x 23 mm) DES to the RCA.  . Ischemic cardiomyopathy     a. Echo (06/28/2011): Inferior and inferoseptal akinesis, EF 49%, grade 2 diastolic dysfunction, MAC, small effusion (no tamponade)  . HTN (hypertension)   . HLD (hyperlipidemia)   . Pericardial effusion     chronic since hx of pericarditis in 2004  . Anginal pain   . Depression   . Shortness of breath   . Diabetes mellitus without complication     TYPE 2  . Arthritis     SPINE    OSTEO    Past Surgical History  Procedure Laterality Date  . Back surgery      fusion c3-t1  . Ablation  2000    PVI X2 by Dr Omelia Blackwater at [facility]  . Appendectomy    . Tonsillectomy      Allergies  Allergen Reactions  . Codeine Nausea And Vomiting  . Other Nausea And Vomiting    The patient states that he has had  a reaction to all narcotics. He can take them though.    Current Outpatient Prescriptions  Medication Sig Dispense Refill  . Ascorbic Acid (VITAMIN C) 1000 MG tablet Take 1,000 mg by mouth daily.      . B Complex-Biotin-FA (VITAMIN B50 COMPLEX PO) Take 1 capsule by mouth daily.      . baclofen (LIORESAL) 10 MG tablet Take 10 mg by mouth 3 (three) times daily.      . Canagliflozin (INVOKANA) 300 MG TABS Take 300 mg by mouth daily.      . cholecalciferol (VITAMIN D) 1000 UNITS tablet Take 1,000 Units by mouth daily.      . cyclobenzaprine (FLEXERIL) 10 MG tablet Take 10 mg by mouth 3 (three) times daily as needed for muscle spasms.      . fenofibrate 160 MG tablet Take 160 mg by mouth daily.      Marland Kitchen gabapentin (NEURONTIN) 300 MG  capsule Take 300-600 mg by mouth 3 (three) times daily. Take 343m in the morning, 3075min the afternoon, and 60017mt bedtime.      . gMarland Kitchenimepiride (AMARYL) 2 MG tablet Take 2 mg by mouth 2 (two) times daily.       . aMarland Kitchenpirin EC 81 MG tablet Take 81 mg by mouth daily.      . aMarland Kitchenorvastatin (LIPITOR) 20 MG tablet Take 20 mg by mouth daily.      . bMarland KitchenPROPion (WELLBUTRIN XL) 150 MG 24 hr tablet Take 150 mg by mouth daily.      . colchicine 0.6 MG tablet  Take 0.6 mg by mouth 2 (two) times daily.      . isosorbide mononitrate (IMDUR) 60 MG 24 hr tablet Take 60 mg by mouth daily.      . metoprolol succinate (TOPROL-XL) 100 MG 24 hr tablet Take 100 mg by mouth daily. Take with or immediately following a meal.      . nitroGLYCERIN (NITROSTAT) 0.4 MG SL tablet Place 0.4 mg under the tongue every 5 (five) minutes as needed for chest pain.      . ranolazine (RANEXA) 500 MG 12 hr tablet Take 500 mg by mouth 2 (two) times daily.      . sitaGLIPtin-metformin (JANUMET) 50-500 MG per tablet Take 1 tablet by mouth 2 (two) times daily with a meal.      . Ticagrelor (BRILINTA) 90 MG TABS tablet Take 90 mg by mouth 2 (two) times daily.       No current facility-administered medications for this visit.    History   Social History  . Marital Status: Married    Spouse Name: N/A    Number of Children: N/A  . Years of Education: N/A   Occupational History  . Not on file.   Social History Main Topics  . Smoking status: Former Smoker    Quit date: 05/06/2007  . Smokeless tobacco: Never Used  . Alcohol Use: Yes     Comment: rarely  . Drug Use: No  . Sexual Activity: Yes   Other Topics Concern  . Not on file   Social History Narrative  . No narrative on file   Socially he is married. 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 There is no tobacco use.  History reviewed. No pertinent family history.  ROS is negative for fevers, chills or night sweats. He denies  headaches. He denies change in vision or hearing. He denies wheezing. He does note at times while shortness of breath. He denies PND or orthopnea. He has expressed recurrent episodes of chest discomfort described as tightness. He denies nausea vomiting or diarrhea. He's aware blood in stool or urine. He denies myalgias. He denies claudication symptoms. He is diabetic. He does have hyperlipidemia.  His sleep breathing is abnormal. He does have history of depression for which he has been on Wellbutrin. Other comprehensive 14 point system review is negative.  PE BP 110/78  Pulse 84  Ht 5' 7"  (1.702 m)  Wt 183 lb (83.008 kg)  BMI 28.66 kg/m2  General: Alert, oriented, no distress.  Skin: normal turgor, no rashes HEENT: Normocephalic, atraumatic. Pupils round and reactive; sclera anicteric;no lid lag. Extraocular muscles intact. No Nose without nasal septal hypertrophy Mouth/Parynx benign; Mallinpatti scale 3 Neck: No JVD, no carotid bruits; normal carotid upstroke Lungs: clear to ausculatation and percussion; no wheezing or rales Chest wall: no tenderness to palpitation Heart:  trigeminal pattern rhythm, s1 s2 normal no S3 or S4 gallop. No rub. Over 6 systolic murmur to Abdomen: soft, nontender; no hepatosplenomehaly, BS+; abdominal aorta nontender and not dilated by palpation. Back: no CVA tenderness Pulses 2+ right femoral and right radial cath sites well healed  Extremities: no clubbing cyanosis or edema, Homan's sign negative  Neurologic: grossly nonfocal; cranial nerves grossly normal. Psychologic: normal affect and mood.  Recent ECG of 08/07/2013  (independently read by me): Sinus rhythm at 86; no ectopy  Prior ECG of 07/29/2013 (independently read by me): Underlying sinus rhythm with ventricular trigeminy. Sinus rate 97 beats per minute. A small Q  wave in lead 3.  LABS:  BMET    Component Value Date/Time   NA 139 08/07/2013 0857   K 4.0 08/07/2013 0857   CL 100 08/07/2013 0857   CO2  28 08/07/2013 0857   GLUCOSE 218* 08/07/2013 0857   BUN 20 08/07/2013 0857   CREATININE 0.98 08/07/2013 0857   CREATININE 0.91 07/10/2013 0625   CALCIUM 8.8 08/07/2013 0857   GFRNONAA >90 07/10/2013 0625   GFRAA >90 07/10/2013 0625     Hepatic Function Panel     Component Value Date/Time   PROT 6.4 08/07/2013 0857   ALBUMIN 4.4 08/07/2013 0857   AST 44* 08/07/2013 0857   ALT 75* 08/07/2013 0857   ALKPHOS 72 08/07/2013 0857   BILITOT 0.8 08/07/2013 0857     CBC    Component Value Date/Time   WBC 5.4 08/07/2013 0857   RBC 3.83* 08/07/2013 0857   HGB 13.2 08/07/2013 0857   HCT 36.2* 08/07/2013 0857   PLT 205 08/07/2013 0857   MCV 94.5 08/07/2013 0857   MCH 34.5* 08/07/2013 0857   MCHC 36.5* 08/07/2013 0857   RDW 13.8 08/07/2013 0857   LYMPHSABS 1.3 07/09/2013 1355   MONOABS 0.5 07/09/2013 1355   EOSABS 0.2 07/09/2013 1355   BASOSABS 0.0 07/09/2013 1355     BNP No results found for this basename: probnp    Lipid Panel     Component Value Date/Time   CHOL 175 06/27/2013 0630   TRIG 152* 08/07/2013 0857   TRIG 126 06/27/2013 0630   HDL 31* 06/27/2013 0630   CHOLHDL 5.6 06/27/2013 0630   VLDL 25 06/27/2013 0630   LDLCALC 49 08/07/2013 0857   LDLCALC 119* 06/27/2013 0630     RADIOLOGY: No results found.    ASSESSMENT AND PLAN: [name] is a 63 year old gentleman with diabetes mellitus for at least 7 years, a history of hyperlipidemia, who suffered a recent non-ST segment elevation myocardial infarction secondary to high-grade distal RCA disease which was successfully stented on 06/27/2013. Repeat catheterization for recurrent chest pain showed the stent to be widely patent. He does have mild LV dysfunction. Since I last saw him, I had further titrate his Toprol to 75 mg and then 100 mg. Review of his rest images today on his nuclear perfusion study shows fairly normal perfusion with mild thinning in the basal inferior wall. On a exercise treadmill portion of the test, he developed 6/10 chest  pain associated with marked shortness of breath, wheezing, and had an abnormal blood pressure response were dropped to 87/50. Because the test was stopped prematurely he never was reinjected for his stress imaging. Based on his abnormal exercise test today, it is my recommendation that definitive repeat cardiac catheterization be performed. I've provided him with samples of Ranexa 500 mg twice a day which will be added  to his current medical regimen. I did review his recent laboratory work including his NMR profile which demonstrated an LDL particle #84 LDL calculated was 149 total claustral 106 and HDL very low at 27. He did have very minimal ALT elevation at 75 and AST elevation at 44 and for this reason had held his atorvastatin for the last week. Repeat laboratory will be obtained today in anticipation of his cardiac catheterization. Cardiac catheterization will be done in 2 days on 08/28/2013 at [place]. He is here with his wife who both agree to proceed with this definitive reassessment of his coronary anatomy.   Troy Sine, MD, [facility]  08/26/2013 5:37 PM

## 2013-08-28 NOTE — CV Procedure (Signed)
Kristopher [name] is a 63 y.o. male    [id number]  [id number] LOCATION:  FACILITY: [facility]  PHYSICIAN: Troy Sine, MD, [facility] Sep 16, 1950   DATE OF PROCEDURE:  08/28/2013    CARDIAC CATHETERIZATION     HISTORY:   Mr. [name] is a 63 year old male with a history of type 2 diabetes mellitus, hypertension, and remote history of atrial fibrillation ablation, who suffered a non-ST segment elevation myocardial infarction in January 2015 and underwent stenting of his distal right coronary artery with insertion of a science Alpine DES 3.0x23 mm stent. He has developed recurrent episodes of chest pain. In late January he underwent repeat catheterization which showed his stent site to be widely patent. The patient has continued to experience recurrent episodes of chest discomfort despite increasing medications. He was recently referred for an exercise Myoview study and developed 6/10 chest pain and had an abnormal blood pressure response to exercise bleeding to discontinuance of the study prior to the opportunity of obtaining stress images. He now is referred for definitive repeat cardiac catheterization.   PROCEDURE:  The patient was brought to the second floor [facility] cath lab in the postabsorptive state. He was premedicated with Versed 2 mg and fentanyl 50 mcg. His right groinwas prepped and shaved in usual sterile fashion. Xylocaine 1% was used for local anesthesia. A 5 French sheath was inserted into the right femoral artery. Diagnostic catheterizatiion was done with 5 [place] LF4, FR4, and pigtail catheters. Left ventriculography was done with 25 cc Omnipaque contrast. Hemostasis was obtained by direct manual compression. The patient tolerated the procedure well.   HEMODYNAMICS:   Central Aorta: 112/66   Left Ventricle:  112/8  ANGIOGRAPHY:  1. Left main: angiographically normal vessel which bifurcated into the LAD and left circumflex coronary artery. 2. LAD: angiographically  normal vessel which gave rise to 2 proximal septal perforating arteries and a segment which seem to get intramyocardially, and then gave rise to a diagonal vessel and several additional septal perforator arteries. The LAD and its branches are angiographically normal. There is no evidence for systolic bridging.  3. Left circumflex: angiographically normal vessel which gave rise to 4 marginal branches. 4. Right coronary artery: moderate size vessel that had mild 30% mid narrowing that had been present previously. The RCA stent just beyond the acute margin was widely patent with no evidence for restenosis. The distal RCA was free of significant disease.  Left ventriculography revealed preserved global LV function with an ejection fraction of 55%. There is very subtle mild mid inferior hypocontractility   IMPRESSION:  No significant restenosis with a widely patent stent in the distal right carotid artery and evidence for previously noted percent mid RCA stenosis  Normal LAD with its proximal segment which may dip intramyocardially without evidence for systolic bridging.  Normal left circumflex coronary artery.  Troy Sine, MD, [facility] 08/28/2013 10:50 PM

## 2013-08-28 NOTE — Interval H&P Note (Signed)
Cath Lab Visit (complete for each Cath Lab visit)  Clinical Evaluation Leading to the Procedure:   ACS: no  Non-ACS:    Anginal Classification: CCS III  Anti-ischemic medical therapy: Maximal Therapy (2 or more classes of medications)  Non-Invasive Test Results: Intermediate-risk stress test findings: cardiac mortality 1-3%/year  Prior CABG: No previous CABG      History and Physical Interval Note:  08/28/2013 2:40 PM  Pearlean Brownie  has presented today for surgery, with the diagnosis of CP  The various methods of treatment have been discussed with the patient and family. After consideration of risks, benefits and other options for treatment, the patient has consented to  Procedure(s): LEFT HEART CATHETERIZATION WITH CORONARY ANGIOGRAM (N/A) as a surgical intervention .  The patient's history has been reviewed, patient examined, no change in status, stable for surgery.  I have reviewed the patient's chart and labs.  Questions were answered to the patient's satisfaction.     KELLY,THOMAS A

## 2013-08-29 ENCOUNTER — Encounter (HOSPITAL_COMMUNITY): Payer: 59

## 2013-08-29 ENCOUNTER — Ambulatory Visit (HOSPITAL_COMMUNITY): Payer: 59

## 2013-09-01 ENCOUNTER — Emergency Department (HOSPITAL_COMMUNITY): Payer: 59

## 2013-09-01 ENCOUNTER — Observation Stay (HOSPITAL_COMMUNITY)
Admission: EM | Admit: 2013-09-01 | Discharge: 2013-09-04 | Disposition: A | Discharge status: Home / Self Care | Payer: 59 | Attending: Cardiovascular Disease | Admitting: Cardiovascular Disease

## 2013-09-01 ENCOUNTER — Ambulatory Visit (HOSPITAL_COMMUNITY)
Admission: RE | Admit: 2013-09-01 | Discharge: 2013-09-01 | Disposition: A | Discharge status: Home / Self Care | Payer: 59 | Source: Ambulatory Visit | Attending: Cardiovascular Disease | Admitting: Cardiovascular Disease

## 2013-09-01 ENCOUNTER — Encounter (HOSPITAL_COMMUNITY): Payer: Self-pay | Admitting: Emergency Medicine

## 2013-09-01 ENCOUNTER — Ambulatory Visit (HOSPITAL_COMMUNITY): Payer: 59

## 2013-09-01 ENCOUNTER — Telehealth: Payer: Self-pay | Admitting: Physician Assistant

## 2013-09-01 ENCOUNTER — Other Ambulatory Visit: Payer: Self-pay

## 2013-09-01 ENCOUNTER — Encounter (HOSPITAL_COMMUNITY)
Admission: RE | Admit: 2013-09-01 | Discharge: 2013-09-01 | Disposition: A | Discharge status: Home / Self Care | Payer: 59 | Source: Ambulatory Visit | Attending: Internal Medicine | Admitting: Internal Medicine

## 2013-09-01 ENCOUNTER — Observation Stay (HOSPITAL_COMMUNITY): Payer: 59

## 2013-09-01 DIAGNOSIS — I2589 Other forms of chronic ischemic heart disease: Secondary | ICD-10-CM | POA: Insufficient documentation

## 2013-09-01 DIAGNOSIS — E785 Hyperlipidemia, unspecified: Secondary | ICD-10-CM | POA: Insufficient documentation

## 2013-09-01 DIAGNOSIS — I313 Pericardial effusion (noninflammatory): Secondary | ICD-10-CM

## 2013-09-01 DIAGNOSIS — Z9889 Other specified postprocedural states: Secondary | ICD-10-CM | POA: Insufficient documentation

## 2013-09-01 DIAGNOSIS — R079 Chest pain, unspecified: Secondary | ICD-10-CM | POA: Diagnosis present

## 2013-09-01 DIAGNOSIS — E119 Type 2 diabetes mellitus without complications: Secondary | ICD-10-CM | POA: Diagnosis present

## 2013-09-01 DIAGNOSIS — M47812 Spondylosis without myelopathy or radiculopathy, cervical region: Secondary | ICD-10-CM | POA: Insufficient documentation

## 2013-09-01 DIAGNOSIS — F3289 Other specified depressive episodes: Secondary | ICD-10-CM | POA: Insufficient documentation

## 2013-09-01 DIAGNOSIS — G8929 Other chronic pain: Secondary | ICD-10-CM | POA: Insufficient documentation

## 2013-09-01 DIAGNOSIS — R6889 Other general symptoms and signs: Secondary | ICD-10-CM | POA: Insufficient documentation

## 2013-09-01 DIAGNOSIS — M47817 Spondylosis without myelopathy or radiculopathy, lumbosacral region: Secondary | ICD-10-CM | POA: Insufficient documentation

## 2013-09-01 DIAGNOSIS — I498 Other specified cardiac arrhythmias: Secondary | ICD-10-CM

## 2013-09-01 DIAGNOSIS — Z981 Arthrodesis status: Secondary | ICD-10-CM | POA: Insufficient documentation

## 2013-09-01 DIAGNOSIS — I252 Old myocardial infarction: Secondary | ICD-10-CM | POA: Insufficient documentation

## 2013-09-01 DIAGNOSIS — I2510 Atherosclerotic heart disease of native coronary artery without angina pectoris: Secondary | ICD-10-CM | POA: Insufficient documentation

## 2013-09-01 DIAGNOSIS — Z885 Allergy status to narcotic agent status: Secondary | ICD-10-CM | POA: Insufficient documentation

## 2013-09-01 DIAGNOSIS — R0989 Other specified symptoms and signs involving the circulatory and respiratory systems: Secondary | ICD-10-CM | POA: Insufficient documentation

## 2013-09-01 DIAGNOSIS — I309 Acute pericarditis, unspecified: Secondary | ICD-10-CM | POA: Diagnosis present

## 2013-09-01 DIAGNOSIS — I4891 Unspecified atrial fibrillation: Secondary | ICD-10-CM | POA: Insufficient documentation

## 2013-09-01 DIAGNOSIS — F329 Major depressive disorder, single episode, unspecified: Secondary | ICD-10-CM | POA: Insufficient documentation

## 2013-09-01 DIAGNOSIS — I200 Unstable angina: Secondary | ICD-10-CM | POA: Insufficient documentation

## 2013-09-01 DIAGNOSIS — Z23 Encounter for immunization: Secondary | ICD-10-CM | POA: Insufficient documentation

## 2013-09-01 DIAGNOSIS — Z87891 Personal history of nicotine dependence: Secondary | ICD-10-CM | POA: Insufficient documentation

## 2013-09-01 DIAGNOSIS — I3139 Other pericardial effusion (noninflammatory): Secondary | ICD-10-CM | POA: Diagnosis present

## 2013-09-01 DIAGNOSIS — R9430 Abnormal result of cardiovascular function study, unspecified: Secondary | ICD-10-CM

## 2013-09-01 DIAGNOSIS — I319 Disease of pericardium, unspecified: Secondary | ICD-10-CM | POA: Insufficient documentation

## 2013-09-01 DIAGNOSIS — Z8701 Personal history of pneumonia (recurrent): Secondary | ICD-10-CM | POA: Insufficient documentation

## 2013-09-01 DIAGNOSIS — I10 Essential (primary) hypertension: Secondary | ICD-10-CM

## 2013-09-01 DIAGNOSIS — R0789 Other chest pain: Principal | ICD-10-CM | POA: Insufficient documentation

## 2013-09-01 DIAGNOSIS — I255 Ischemic cardiomyopathy: Secondary | ICD-10-CM

## 2013-09-01 DIAGNOSIS — I214 Non-ST elevation (NSTEMI) myocardial infarction: Secondary | ICD-10-CM | POA: Diagnosis present

## 2013-09-01 DIAGNOSIS — M1990 Unspecified osteoarthritis, unspecified site: Secondary | ICD-10-CM | POA: Insufficient documentation

## 2013-09-01 HISTORY — DX: Other chronic pain: G89.29

## 2013-09-01 HISTORY — DX: Spondylosis without myelopathy or radiculopathy, lumbosacral region: M47.817

## 2013-09-01 HISTORY — DX: Type 2 diabetes mellitus without complications: E11.9

## 2013-09-01 HISTORY — DX: Pneumonia, unspecified organism: J18.9

## 2013-09-01 HISTORY — DX: Spondylosis without myelopathy or radiculopathy, cervical region: M47.812

## 2013-09-01 HISTORY — DX: Dorsalgia, unspecified: M54.9

## 2013-09-01 HISTORY — DX: Unspecified osteoarthritis, unspecified site: M19.90

## 2013-09-01 LAB — CBC
HCT: 37.2 % — ABNORMAL LOW (ref 39.0–52.0)
Hemoglobin: 13.4 g/dL (ref 13.0–17.0)
MCH: 34.3 pg — ABNORMAL HIGH (ref 26.0–34.0)
MCHC: 36 g/dL (ref 30.0–36.0)
MCV: 95.1 fL (ref 78.0–100.0)
PLATELETS: 215 10*3/uL (ref 150–400)
RBC: 3.91 MIL/uL — AB (ref 4.22–5.81)
RDW: 14.5 % (ref 11.5–15.5)
WBC: 5.4 10*3/uL (ref 4.0–10.5)

## 2013-09-01 LAB — TROPONIN I: Troponin I: <0.3 ng/mL (ref <0.30)

## 2013-09-01 LAB — HEPATIC FUNCTION PANEL
ALT: 106 U/L — ABNORMAL HIGH (ref 0–53)
AST: 63 U/L — ABNORMAL HIGH (ref 0–37)
Albumin: 4 g/dL (ref 3.5–5.2)
Alkaline Phosphatase: 79 U/L (ref 39–117)
Bilirubin, Direct: <0.2 mg/dL (ref 0.0–0.3)
Total Bilirubin: 0.7 mg/dL (ref 0.3–1.2)
Total Protein: 6.8 g/dL (ref 6.0–8.3)

## 2013-09-01 LAB — I-STAT TROPONIN, ED: TROPONIN I, POC: 0.01 ng/mL (ref 0.00–0.08)

## 2013-09-01 LAB — D-DIMER, QUANTITATIVE (NOT AT ARMC)

## 2013-09-01 LAB — BASIC METABOLIC PANEL
BUN: 25 mg/dL — ABNORMAL HIGH (ref 6–23)
CHLORIDE: 99 meq/L (ref 96–112)
CO2: 22 meq/L (ref 19–32)
Calcium: 10.2 mg/dL (ref 8.4–10.5)
Creatinine, Ser: 1.1 mg/dL (ref 0.50–1.35)
GFR calc Af Amer: 82 mL/min — ABNORMAL LOW (ref >90)
GFR calc non Af Amer: 71 mL/min — ABNORMAL LOW (ref >90)
Glucose, Bld: 281 mg/dL — ABNORMAL HIGH (ref 70–99)
Potassium: 4.1 mEq/L (ref 3.7–5.3)
SODIUM: 138 meq/L (ref 137–147)

## 2013-09-01 LAB — PRO B NATRIURETIC PEPTIDE: PRO B NATRI PEPTIDE: 166.2 pg/mL — AB (ref 0–125)

## 2013-09-01 LAB — GLUCOSE, CAPILLARY
GLUCOSE-CAPILLARY: 294 mg/dL — AB (ref 70–99)
Glucose-Capillary: 172 mg/dL — ABNORMAL HIGH (ref 70–99)
Glucose-Capillary: 202 mg/dL — ABNORMAL HIGH (ref 70–99)

## 2013-09-01 LAB — LIPASE, BLOOD: Lipase: 23 U/L (ref 11–59)

## 2013-09-01 LAB — SEDIMENTATION RATE: SED RATE: 23 mm/h — AB (ref 0–16)

## 2013-09-01 MED ORDER — TICAGRELOR 90 MG PO TABS
90.0000 mg | ORAL_TABLET | Freq: Two times a day (BID) | ORAL | Status: DC
  Administered 2013-09-01 – 2013-09-04 (×6): 90 mg via ORAL
  Filled 2013-09-01 (×7): qty 1

## 2013-09-01 MED ORDER — VITAMIN C 500 MG PO TABS
1000.0000 mg | ORAL_TABLET | Freq: Every day | ORAL | Status: DC
  Administered 2013-09-02 – 2013-09-04 (×3): 1000 mg via ORAL
  Filled 2013-09-01 (×3): qty 2

## 2013-09-01 MED ORDER — VITAMIN D3 25 MCG (1000 UNIT) PO TABS
1000.0000 [IU] | ORAL_TABLET | Freq: Every day | ORAL | Status: DC
  Administered 2013-09-02 – 2013-09-04 (×3): 1000 [IU] via ORAL
  Filled 2013-09-01 (×3): qty 1

## 2013-09-01 MED ORDER — IBUPROFEN 800 MG PO TABS
800.0000 mg | ORAL_TABLET | Freq: Three times a day (TID) | ORAL | Status: DC
  Administered 2013-09-01 – 2013-09-04 (×10): 800 mg via ORAL
  Filled 2013-09-01 (×11): qty 1

## 2013-09-01 MED ORDER — BACLOFEN 10 MG PO TABS
10.0000 mg | ORAL_TABLET | Freq: Three times a day (TID) | ORAL | Status: DC
  Administered 2013-09-01 – 2013-09-04 (×10): 10 mg via ORAL
  Filled 2013-09-01 (×11): qty 1

## 2013-09-01 MED ORDER — ISOSORBIDE MONONITRATE ER 60 MG PO TB24
60.0000 mg | ORAL_TABLET | Freq: Every day | ORAL | Status: DC
  Administered 2013-09-02 – 2013-09-04 (×3): 60 mg via ORAL
  Filled 2013-09-01 (×3): qty 1

## 2013-09-01 MED ORDER — METOPROLOL SUCCINATE ER 100 MG PO TB24
100.0000 mg | ORAL_TABLET | Freq: Every day | ORAL | Status: DC
  Administered 2013-09-02 – 2013-09-04 (×3): 100 mg via ORAL
  Filled 2013-09-01 (×3): qty 1

## 2013-09-01 MED ORDER — GLIMEPIRIDE 2 MG PO TABS
2.0000 mg | ORAL_TABLET | Freq: Two times a day (BID) | ORAL | Status: DC
  Administered 2013-09-01 – 2013-09-04 (×7): 2 mg via ORAL
  Filled 2013-09-01 (×9): qty 1

## 2013-09-01 MED ORDER — CYCLOBENZAPRINE HCL 10 MG PO TABS
10.0000 mg | ORAL_TABLET | Freq: Three times a day (TID) | ORAL | Status: DC | PRN
  Filled 2013-09-01: qty 1

## 2013-09-01 MED ORDER — GABAPENTIN 300 MG PO CAPS
900.0000 mg | ORAL_CAPSULE | Freq: Every day | ORAL | Status: DC
  Administered 2013-09-01 – 2013-09-03 (×3): 900 mg via ORAL
  Filled 2013-09-01 (×4): qty 3

## 2013-09-01 MED ORDER — CANAGLIFLOZIN 300 MG PO TABS
300.0000 mg | ORAL_TABLET | Freq: Every day | ORAL | Status: DC
  Administered 2013-09-02 – 2013-09-04 (×3): 300 mg via ORAL
  Filled 2013-09-01 (×3): qty 1

## 2013-09-01 MED ORDER — BUPROPION HCL ER (XL) 150 MG PO TB24
150.0000 mg | ORAL_TABLET | Freq: Every day | ORAL | Status: DC
  Administered 2013-09-02 – 2013-09-04 (×3): 150 mg via ORAL
  Filled 2013-09-01 (×3): qty 1

## 2013-09-01 MED ORDER — RANOLAZINE ER 500 MG PO TB12
500.0000 mg | ORAL_TABLET | Freq: Two times a day (BID) | ORAL | Status: DC
  Administered 2013-09-01 – 2013-09-03 (×5): 500 mg via ORAL
  Filled 2013-09-01 (×7): qty 1

## 2013-09-01 MED ORDER — ASPIRIN EC 81 MG PO TBEC
81.0000 mg | DELAYED_RELEASE_TABLET | Freq: Every day | ORAL | Status: DC
  Administered 2013-09-02 – 2013-09-04 (×3): 81 mg via ORAL
  Filled 2013-09-01 (×3): qty 1

## 2013-09-01 MED ORDER — GABAPENTIN 300 MG PO CAPS
600.0000 mg | ORAL_CAPSULE | Freq: Two times a day (BID) | ORAL | Status: DC
  Administered 2013-09-01 – 2013-09-04 (×7): 600 mg via ORAL
  Filled 2013-09-01 (×8): qty 2

## 2013-09-01 MED ORDER — GABAPENTIN 300 MG PO CAPS: 600.0000 mg | ORAL_CAPSULE | Freq: Three times a day (TID) | ORAL | Status: DC

## 2013-09-01 MED ORDER — SODIUM CHLORIDE 0.9 % IV SOLN: 250.0000 mL | INTRAVENOUS | Status: DC | PRN

## 2013-09-01 MED ORDER — PANTOPRAZOLE SODIUM 40 MG PO TBEC
40.0000 mg | DELAYED_RELEASE_TABLET | Freq: Every day | ORAL | Status: DC
  Administered 2013-09-01 – 2013-09-04 (×4): 40 mg via ORAL
  Filled 2013-09-01 (×4): qty 1

## 2013-09-01 MED ORDER — SODIUM CHLORIDE 0.9 % IJ SOLN: 3.0000 mL | INTRAMUSCULAR | Status: DC | PRN

## 2013-09-01 MED ORDER — PNEUMOCOCCAL VAC POLYVALENT 25 MCG/0.5ML IJ INJ
0.5000 mL | INJECTION | INTRAMUSCULAR | Status: AC
  Administered 2013-09-02: 0.5 mL via INTRAMUSCULAR
  Filled 2013-09-01: qty 0.5

## 2013-09-01 MED ORDER — COLCHICINE 0.6 MG PO TABS
0.6000 mg | ORAL_TABLET | Freq: Two times a day (BID) | ORAL | Status: DC
  Administered 2013-09-01 – 2013-09-04 (×6): 0.6 mg via ORAL
  Filled 2013-09-01 (×7): qty 1

## 2013-09-01 MED ORDER — NITROGLYCERIN 0.4 MG SL SUBL: 0.4000 mg | SUBLINGUAL_TABLET | SUBLINGUAL | Status: DC | PRN

## 2013-09-01 MED ORDER — LINAGLIPTIN 5 MG PO TABS
5.0000 mg | ORAL_TABLET | Freq: Every day | ORAL | Status: DC
  Administered 2013-09-01 – 2013-09-04 (×4): 5 mg via ORAL
  Filled 2013-09-01 (×4): qty 1

## 2013-09-01 MED ORDER — INSULIN ASPART 100 UNIT/ML ~~LOC~~ SOLN
0.0000 [IU] | Freq: Three times a day (TID) | SUBCUTANEOUS | Status: DC
  Administered 2013-09-01: 2 [IU] via SUBCUTANEOUS
  Administered 2013-09-02: 3 [IU] via SUBCUTANEOUS
  Administered 2013-09-02: 2 [IU] via SUBCUTANEOUS
  Administered 2013-09-02: 1 [IU] via SUBCUTANEOUS
  Administered 2013-09-03: 3 [IU] via SUBCUTANEOUS
  Administered 2013-09-03 (×2): 2 [IU] via SUBCUTANEOUS
  Administered 2013-09-04: 3 [IU] via SUBCUTANEOUS
  Administered 2013-09-04: 2 [IU] via SUBCUTANEOUS
  Administered 2013-09-04: 5 [IU] via SUBCUTANEOUS

## 2013-09-01 MED ORDER — SODIUM CHLORIDE 0.9 % IJ SOLN
3.0000 mL | Freq: Two times a day (BID) | INTRAMUSCULAR | Status: DC
  Administered 2013-09-01 – 2013-09-04 (×6): 3 mL via INTRAVENOUS

## 2013-09-01 NOTE — ED Notes (Signed)
Attempted to call report, nurse and charge nurse unavailable

## 2013-09-01 NOTE — Progress Notes (Addendum)
Re: CXR abnormality. Will reassess by CT this admission. Will need to determine after this is done if any further f/u is needed.  ADDENDUM: Sed rate 23 thus will hold off on prednisone. See prior note.  Ira Busbin PA-C

## 2013-09-01 NOTE — Telephone Encounter (Signed)
Maria from cardiac rehab called to report patient's episode of chest pain. H/o CAD, NSTEMI 06/2013 with stenting to distal RCA. CT angio at that time showed moderate pericardial effusion. Had recurrence of CP with relook cath 1/22 that was OK, at which time Imdur was added for microvascular angina. Post-discharge he had recurrent chest pain with abnormal nuclear stress test 3/10/!5 with development of significant chest pain, dyspnea and an abnormal hypotensive BP response immediately post exercise. Rest images revealed mid to basal inferior thinning. Stress images were not able to be obtained due to the abrupt discontinuance of the study prior to achieving and adequate HR response. He underwent relook cath 08/28/13 with patent stent, mid RCA stenosis, normal LAD with its proximal segment which may dip intramyocardially without evidence for systolic bridging, normal prox Lcx. Verdis Frederickson reports that Ranexa was added at that time - this is not mentioned in cath report but is on patient's med list. While doing weights at cardiac rehab today, the patient developed 4/10 CP with new diaphoresis. He had not had diaphoresis with earlier exercise. Pain resolved with rest and application of O2 and laying down. O2 sat 95-97 on 4L. He is not on any home O2 but does have history of COPD. BP 122/60 and HR 102 post exercise. Difficult to fully eval etiology of CP over the phone and he would be best served with in-person evaluation given recently complex hx. Tiffany Kocher that he should proceed to ER for most efficient evaluation of symptoms given his history. She verbalized understanding of plan. Dayna Dunn PA-C

## 2013-09-01 NOTE — ED Notes (Signed)
Pt taken off 2L [place]

## 2013-09-01 NOTE — ED Notes (Addendum)
Pt from cardiac rehab, had hx of NSTEMI in Jan, had a cath this last week. Pt was doing weights, started having CP, 4/10, diaphoretic, BP was 90/60, did not take any nitro. Pt placed on oxygen. Pt states ehs had angina every couple days since catherization. PT AAOx4, in NAD. Denies pain. Skin warm and dry.

## 2013-09-01 NOTE — ED Notes (Signed)
Attempted to call report, nurse unavailable.

## 2013-09-01 NOTE — H&P (Signed)
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   Recent history is somewhat complicated. He was admitted to [facility] on 06/27/13 with chest pain radiating to his upper extremity/back and ruled in for NSTEMI with eventual troponin >20. He underwent cath and was found to have high-grade diffuse 90+ % distal RCA stenoses beyond the acute margin and proximal to the PDA takeoff and mild 10 at 20% narrowing in the LAD with a normal circumflex vessel; EF 45-50%.  He underwent successful stenting with Xience DES. He was treated with aspirin and Brilinta. During that admission he had a CT angio that showed a moderate pericardial effusion but no demonstrable pulmonary embolism. Post discharge, he developed recurrent chest pain leading to readmission and he underwent repeat cath by Dr. Angelena Form on 07/10/13. His RCA stent was widely patent and ejection fraction was 40%. Imdur was added. He later saw [name], PAC on 07/17/13 and his pulse was 100. His beta blocker was increased from Toprol-XL 25 to 50 mg. He continued to experience intermittent episodes of chest pressure. On 07/29/13, his Toprol was increased to 138m daily for ventricular trigeminy. The patient continued to have symptoms. Dr. KClaiborne Billingsarranged for exercise Myoview study for evaluate for ischemia. He exercised 08/26/13 on the Bruce protocol for 7:25. Test was stopped due to marked SOB, nausea, and 6/10 chest tightness. He experienced a drop in BP from 124/87 at rest to 87/50 at peak exercise and immediate recovery. Repeat cardiac cath was arranged for 08/28/13 which  showed no significant restenosis, widely patent RCA stent, normal LAD with its proximal segment which may dip intramyocardially without evidence for systolic bridging, and normal Cx, EF was 55% with subtle mild mid inferior hypocontractility. Per cardiac rehab nurse's report today, Ranexa was added at that time.  He was at cardiac rehab today and developed an episode of 4/10 CP while exercising with weights during cool-down, associated with diaphoresis. No SOB. The pain was described as pressure, as it typically is. Vitals were stable except for sinus tach HR 102. BP was 130's. He had not had CP or sweating earlier that day. He reports intermittent chest pressure on/off outside the hospital both with and without exertion. It is not sharp or worse with inspiration. He was advised to seek care in the ER. Initial troponin negative. No acute lab abnormalities other than BUN 25. CXR: no evidence of pulm edema or PNA; did show "724mdiameter soft tissue density nodule projecting over the posterior aspect of the right ninth rib on the frontal view only. This may reflect an engorged pulmonary vessel but a true nodule is not excluded though none was demonstrated here in the past either on plain film or on the CT scan of June 26, 2013. A followup PA and lateral chest x-ray in 3-4 weeks is recommended. Repeat chest CT scanning may be ultimately needed." Weight stable, no LEE, no syncope. He states his PCP is working him up for lupus with a previously elevated ESR > 70 but no clear dx at present. He was also due to have a sleep study tomorrow due to nocturnal  desats to 82% in absence of snoring. He is on colchicine already twice a day per Dr. Claiborne Billings.  Past Medical History  Diagnosis Date  . Herniated cervical disc     c2-T1 fusion  . Chronic pain     back pain  . Atrial fibrillation     a. s/p PVI X2 by Dr Omelia Blackwater at [facility] around 2000.  Marland Kitchen NSTEMI (non-ST elevated myocardial infarction) 06-27-2013  . CAD (coronary  artery disease)     a. NSTEMI (06/2013):  LHC (06/27/2013):  pLAD 10-20, RCA 90, EF 45-50%, inf HK.  PCI:  Xience Alpine (3 x 23 mm) DES to the RCA. b. Relook cath 07/10/13 - patent stent. Imdur added. c. Recurrent CP after that - abnormal nuc 07/2017 with chest pain, dyspnea, hypotensive response. Rest images with mid-basal inferior thinning. No stress images due to abrupt d/c of test. Relook cath 3/12 - no significant r  . Ischemic cardiomyopathy     a. Echo (06/28/2011): Inferior and inferoseptal akinesis, EF 13%, grade 2 diastolic dysfunction, MAC, small effusion (no tamponade). B. EF 40% by echo 06/2013, cath 07/2013, 55% by cath 08/2013 with subtle mild mid inferior hypocontractility.  Marland Kitchen HTN (hypertension)   . HLD (hyperlipidemia)   . Pericardial effusion     a. Chronic since hx of pericarditis in 2004. b. Mod by echo 06/2013, small by echo 06/2013.  Marland Kitchen Depression   . Diabetes mellitus without complication     TYPE 2  . Arthritis     SPINE    OSTEO     Most Recent Cardiac Studies: 2D Echo 06/27/13 - Left ventricle: The cavity size was normal. Wall thickness was normal. Akinetic basal to mid inferior and inferoseptal walls. The estimated ejection fraction was 40%. Features are consistent with a pseudonormal left ventricular filling pattern, with concomitant abnormal relaxation and increased filling pressure (grade 2 diastolic dysfunction). - Aortic valve: There was no stenosis. - Mitral valve: Mildly calcified annulus. Normal thickness leaflets . No significant regurgitation. - Right ventricle: Poorly visualized. The cavity size was normal. Systolic function was normal. - Pulmonary arteries: No complete TR doppler jet so unable to estimate PA systolic pressure. - Systemic veins: IVC poorly visualized. - Pericardium, extracardiac: Small circumferential pericardial effusion with no evidence for tamponade.  Impressions:- Technically difficult study with poor acoustic windows even after Definity was used.  Normal LV size with EF 40%. Basal to mid inferoseptal and inferior akinesis. Moderate diastolic dysfunction. Probably normal RV size and systolic function (not well-visualized). No significant valvular abnormalities.  Cardiac Cath 08/28/13 HISTORY:  Mr. Devell Parkerson is a 63 year old male with a history of type 2 diabetes mellitus, hypertension, and remote history of atrial fibrillation ablation, who suffered a non-ST segment elevation myocardial infarction in January 2015 and underwent stenting of his distal right coronary artery with insertion of a science Alpine DES 3.0x23 mm stent. He has developed recurrent episodes of chest pain. In late January he underwent repeat catheterization which showed his stent site to be widely patent. The patient has continued to experience recurrent episodes of chest discomfort despite increasing medications. He was recently referred for an exercise Myoview study and developed 6/10 chest pain and had an abnormal blood pressure response to exercise bleeding to discontinuance of the study prior to the opportunity of obtaining stress images. He now is referred for definitive repeat cardiac catheterization.  PROCEDURE:  The patient was brought to the second floor  Cardiac cath lab in the postabsorptive state. He was  premedicated with Versed 2 mg and fentanyl 50 mcg. His right groinwas prepped and shaved in usual sterile fashion. Xylocaine 1% was used for local anesthesia. A 5 French sheath was inserted into the right femoral artery. Diagnostic catheterizatiion was done with 5 [place] LF4, FR4, and pigtail catheters. Left ventriculography was done with 25 cc Omnipaque contrast. Hemostasis was obtained by direct manual compression. The patient tolerated the procedure well.  HEMODYNAMICS:  Central Aorta: 112/66  Left Ventricle: 112/8  ANGIOGRAPHY:  1. Left main: angiographically normal vessel which bifurcated into the LAD and left circumflex coronary artery.  2. LAD:  angiographically normal vessel which gave rise to 2 proximal septal perforating arteries and a segment which seem to get intramyocardially, and then gave rise to a diagonal vessel and several additional septal perforator arteries. The LAD and its branches are angiographically normal. There is no evidence for systolic bridging.  3. Left circumflex: angiographically normal vessel which gave rise to 4 marginal branches.  4. Right coronary artery: moderate size vessel that had mild 30% mid narrowing that had been present previously. The RCA stent just beyond the acute margin was widely patent with no evidence for restenosis. The distal RCA was free of significant disease.  Left ventriculography revealed preserved global LV function with an ejection fraction of 55%. There is very subtle mild mid inferior hypocontractility  IMPRESSION:  No significant restenosis with a widely patent stent in the distal right carotid artery and evidence for previously noted percent mid RCA stenosis  Normal LAD with its proximal segment which may dip intramyocardially without evidence for systolic bridging.  Normal left circumflex coronary artery.     Surgical History:  Past Surgical History  Procedure Laterality Date  . Back surgery      fusion c3-t1  . Ablation  2000    PVI X2 by Dr Omelia Blackwater at [facility]  . Appendectomy    . Tonsillectomy       Home Meds: Prior to Admission medications   Medication Sig Start Date End Date Taking? Authorizing Provider  Ascorbic Acid (VITAMIN C) 1000 MG tablet Take 1,000 mg by mouth daily.   Yes Historical Provider, MD  aspirin EC 81 MG tablet Take 81 mg by mouth daily.   Yes Historical Provider, MD  atorvastatin (LIPITOR) 80 MG tablet Take 80 mg by mouth daily.   Yes Historical Provider, MD  B Complex-Biotin-FA (VITAMIN B50 COMPLEX PO) Take 1 capsule by mouth daily.   Yes Historical Provider, MD  baclofen (LIORESAL) 10 MG tablet Take 10 mg by mouth 3 (three) times daily.   Yes  Historical Provider, MD  buPROPion (WELLBUTRIN XL) 150 MG 24 hr tablet Take 150 mg by mouth daily.   Yes Historical Provider, MD  Canagliflozin (INVOKANA) 300 MG TABS Take 300 mg by mouth daily.   Yes Historical Provider, MD  cholecalciferol (VITAMIN D) 1000 UNITS tablet Take 1,000 Units by mouth daily.   Yes Historical Provider, MD  colchicine 0.6 MG tablet Take 0.6 mg by mouth 2 (two) times daily.   Yes Historical Provider, MD  cyclobenzaprine (FLEXERIL) 10 MG tablet Take 10 mg by mouth 3 (three) times daily as needed for muscle spasms.   Yes Historical Provider, MD  fenofibrate 54 MG tablet Take 54 mg by mouth at bedtime.   Yes Historical Provider, MD  gabapentin (NEURONTIN) 300 MG capsule Take 600-900 mg by mouth 3 (three) times daily. Take 619m in the morning, 6040min the afternoon, and 90070mt bedtime.  Yes Historical Provider, MD  glimepiride (AMARYL) 2 MG tablet Take 2 mg by mouth 2 (two) times daily.    Yes Historical Provider, MD  ibuprofen (ADVIL,MOTRIN) 200 MG tablet Take 800 mg by mouth every 6 (six) hours as needed (pain).   Yes Historical Provider, MD  isosorbide mononitrate (IMDUR) 60 MG 24 hr tablet Take 60 mg by mouth daily.   Yes Historical Provider, MD  metoprolol succinate (TOPROL-XL) 100 MG 24 hr tablet Take 100 mg by mouth daily. Take with or immediately following a meal.   Yes Historical Provider, MD  nitroGLYCERIN (NITROSTAT) 0.4 MG SL tablet Place 0.4 mg under the tongue every 5 (five) minutes as needed for chest pain.   Yes Historical Provider, MD  ranolazine (RANEXA) 500 MG 12 hr tablet Take 500 mg by mouth 2 (two) times daily.   Yes Historical Provider, MD  sitaGLIPtin-metformin (JANUMET) 50-500 MG per tablet Take 1 tablet by mouth 2 (two) times daily with a meal.   Yes Historical Provider, MD  Ticagrelor (BRILINTA) 90 MG TABS tablet Take 90 mg by mouth 2 (two) times daily.   Yes Historical Provider, MD    Allergies:  Allergies  Allergen Reactions  . Codeine  Nausea And Vomiting  . Other Nausea And Vomiting    The patient states that he has had  a reaction to all narcotics. He can take them though.    History   Social History  . Marital Status: Married    Spouse Name: N/A    Number of Children: N/A  . Years of Education: N/A   Occupational History  . Not on file.   Social History Main Topics  . Smoking status: Former Smoker    Quit date: 05/06/2007  . Smokeless tobacco: Never Used  . Alcohol Use: Yes     Comment: rarely  . Drug Use: No  . Sexual Activity: Yes   Other Topics Concern  . Not on file   Social History Narrative  . No narrative on file     Family History  Problem Relation Age of Onset  . CAD Neg Hx     No hx early CAD    Review of Systems:No fever. See above. All other systems reviewed and are otherwise negative except as noted above.  Labs:   Lab Results  Component Value Date   WBC 5.4 09/01/2013   HGB 13.4 09/01/2013   HCT 37.2* 09/01/2013   MCV 95.1 09/01/2013   PLT 215 09/01/2013     Recent Labs Lab 09/01/13 1213  NA 138  K 4.1  CL 99  CO2 22  BUN 25*  CREATININE 1.10  CALCIUM 10.2  GLUCOSE 281*   Troponin neg x 1.  Lab Results  Component Value Date   CHOL 175 06/27/2013   HDL 31* 06/27/2013   LDLCALC 49 08/07/2013   TRIG 152* 08/07/2013    Radiology/Studies:  Dg Chest 2 View  09/01/2013   CLINICAL DATA:  Chest pain  EXAM: CHEST  2 VIEW  COMPARISON:  Portable chest x-ray at June 26, 2013.  FINDINGS: The lungs are adequately inflated. There is no focal infiltrate. There is a 7 mm diameter soft tissue density nodule projecting over the posterior aspect of the right ninth rib on the frontal film. This may reflect a mildly engorged pulmonary vein on end, but it is more conspicuous than on the previous study.  The cardiac silhouette has decreased in size since the previous study consistent with a decrease  in the known pericardial effusion. The pulmonary vascularity is prominent centrally. The  mediastinum is normal in width. The descending thoracic aorta demonstrates mild tortuosity. There is no pleural effusion or pneumothorax. The observed portions of the bony thorax exhibit no acute abnormalities. There are stable calcifications in the right axilla.  IMPRESSION: 1. There is no evidence of pulmonary edema or pneumonia. 2. There is a 7 mm diameter soft tissue density nodule projecting over the posterior aspect of the right ninth rib on the frontal view only. This may reflect an engorged pulmonary vessel but a true nodule is not excluded though none was demonstrated here in the past either on plain film or on the CT scan of June 26, 2013. A followup PA and lateral chest x-ray in 3-4 weeks is recommended. Repeat chest CT scanning may be ultimately needed.   Electronically Signed   By: David  Martinique   On: 09/01/2013 12:45    EKG:  Cardiac Rehab: sinus tach 102bpm, prior inferior infarct, nonspecific T wave changes avL, III, avF ER: Sinus tach 100bpm, prior inferior infarct, nonspecific T wave changes III, avF, no acute ST changes Both grossly unchanged from prior  Physical Exam: Blood pressure 91/69, pulse 91, temperature 98.4 F (36.9 C), temperature source Oral, resp. rate 17, height 5' 7"  (1.702 m), weight 180 lb 1.9 oz (81.7 kg), SpO2 96.00%. General: Well developed, well nourished WM in no acute distress. Head: Normocephalic, atraumatic, sclera non-icteric, no xanthomas, nares are without discharge.  Neck: Negative for carotid bruits. JVD not elevated. Lungs: Clear bilaterally to auscultation without wheezes, rales, or rhonchi. Breathing is unlabored. Heart: RRR with S1 S2. No murmurs, rubs, or gallops appreciated. Abdomen: Soft, non-tender, non-distended with normoactive bowel sounds. No hepatomegaly. No rebound/guarding. No obvious abdominal masses. Msk:  Strength and tone appear normal for age. Extremities: No clubbing or cyanosis. No edema.  Distal pedal pulses are 2+ and equal  bilaterally. Neuro: Alert and oriented X 3. No focal deficit. No facial asymmetry. Moves all extremities spontaneously. Psych:  Responds to questions appropriately with a normal affect.    ASSESSMENT AND PLAN:   1. Recurrent chest pain 2. CAD with NSTEMI 06/2013 s/p DES to RCA, relook caths 07/10/13 & 08/28/13 without recurrent disease 3. Ischemic cardiomyopathy EF 55% by most recent cath 08/28/13 (prev 40-45% range) 4. Chronic small pericardial effusion 5. Previously elevated ESR per patient of 70, was undergoing workup by PCP for rheumatologic disorder 6. Nocturnal desaturation 7. HTN, BP erring on low side 8. Hyperlipidemia, statin currently on hold due to elevated LFTs 9. Diabetes mellitus  Patient seen/examined in conjunction with Dr. Johnsie Cancel, plan formulated with him. Etiology of continued chest pain is not clear. Symptoms are not typical for pleuritic type symptoms. However, with history of chronic pericardial effusion, prior elevated ESR, we will treat empirically for pericarditis. The patient is concerned about steroid use and h/o markedly elevated CBG in the past with this. Will check ESR and per Dr. Johnsie Cancel if >40, will start prednisone 55m daily for now and plan taper down if he responds. He may require escalation of DM meds if this is the case. Start ibuprofen 8016mTID, continue colchicine BID, and add PPI. Otherwise continue home meds. Check d-dimer. Check echocardiogram with mention to do doppler HD for constriction. If d-dimer abnormal, will need repeat eval for PE. If negative, recheck noncontrasted CT of chest to evaluate effusion. Holding metformin due to this but can restart tomorrow if he does not get contrast today. Pt  will need to r/s sleep study. F/u LFTs (recently elevated prompting patient holding statin). Do not anticipate need for another cath this admission.  Signed, Melina Copa PA-C 09/01/2013, 2:26 PM   Difficult clinical case.  Patient describes pain as pressure not  typical of pericardial pain.  Two caths and myovue with patent stent since MI.  No acute ECG changes. He indicates chronically elevated ESR in 70 range with negative w/u for lupus.  Cannot take naprosyn because of nausea.  On colchicine and steroids increase his BS  No rub on exam.  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  Check echo and non contrast CT ( pericardium looked thick to me with moderate effusion posteriorly on last CT) Echo with doppler hemodynamics for constriction.  Dyspnea may be related to sleep apnea or effusion/pericardial disease.  Rx with motrin and bid colchicine add steroids and adjust DM meds if ESR still high Check d dimer.  Had sleep study arranged for am but will have to be rescheduled  Follow sats  Jenkins Rouge

## 2013-09-01 NOTE — Telephone Encounter (Signed)
Message copied by Magda Kiel on Mon Sep 01, 2013 12:45 PM ------      Message from: Lauralee Evener.      Created: Fri Aug 29, 2013 11:13 AM      Regarding: RE: resuming cardaic rehb       Per V.O. DR. Claiborne Billings okay for patient to restart on Monday March 16 th.      ----- Message -----         From: Magda Kiel, RN         Sent: 08/29/2013   8:43 AM           To: Troy Sine, MD, Lauralee Evener, CMA      Subject: resuming cardaic rehb                                    Dr Claiborne Billings please advise when Mr Bringhurst may return to exercise at cardiac rehab.                  Thanks for your input!            Verdis Frederickson       ------

## 2013-09-01 NOTE — ED Provider Notes (Signed)
CSN: [id number]     Arrival date & time 09/01/13  1145 History   First MD Initiated Contact with Patient 09/01/13 1202     Chief Complaint  Patient presents with  . Chest Pain     (Consider location/radiation/quality/duration/timing/severity/associated sxs/prior Treatment) HPI Pt presenting from cardiac rehab with chest pain.  He recently had NSTEMi with stent to the RCA.  Since then he has had multiple episodes of chest pain and has had 2 subsequent cardiac caths.  He states he had finished cardiac rehab and was cooling down when he had onset of squeezing tight pain in chest, became diaphoretic and felt weak.  No nausea or vomiting.  No radiation of pain.  Pain located over left anterior chest.  He states BP was taken and this was low.  He did not take nitroglycerin as he was cautioned not to take due to his low blood pressure.  He states symptoms are resolved upon ED evaluation and that he feels at his baseline.  He states that prior to this episode he was feeling well during rehab today.  There are no other associated systemic symptoms, there are no other alleviating or modifying factors.   Past Medical History  Diagnosis Date  . Herniated cervical disc     c2-T1 fusion  . Atrial fibrillation     a. s/p PVI X2 by Dr Omelia Blackwater at [facility] around 2000.  Marland Kitchen CAD (coronary artery disease)     a. NSTEMI (06/2013):  LHC (06/27/2013):  pLAD 10-20, RCA 90, EF 45-50%, inf HK.  PCI:  Xience Alpine (3 x 23 mm) DES to the RCA. b. Relook cath 07/10/13 - patent stent. Imdur added. c. Recurrent CP after that - abnormal nuc 07/2017 with chest pain, dyspnea, hypotensive response. Rest images with mid-basal inferior thinning. No stress images due to abrupt d/c of test. Relook cath 3/12 - no significant r  . Ischemic cardiomyopathy     a. Echo (06/28/2011): Inferior and inferoseptal akinesis, EF 49%, grade 2 diastolic dysfunction, MAC, small effusion (no tamponade). B. EF 40% by echo 06/2013, cath 07/2013, 55% by cath 08/2013  with subtle mild mid inferior hypocontractility.  Marland Kitchen HTN (hypertension)   . HLD (hyperlipidemia)   . Pericardial effusion     a. Chronic since hx of pericarditis in 2004. b. Mod by echo 06/2013, small by echo 06/2013.  Marland Kitchen Depression   . Anginal pain   . NSTEMI (non-ST elevated myocardial infarction) 06-27-2013  . Pneumonia ~ 2005  . Type II diabetes mellitus   . Osteoarthritis     "neck and lower back" (09/01/2013)  . DJD (degenerative joint disease) of cervical spine     "3 herniated discs" (09/01/2013)  . DJD (degenerative joint disease), lumbosacral     "bulging L5-S1" (09/01/2013)  . Chronic back pain greater than 3 months duration    Past Surgical History  Procedure Laterality Date  . Posterior cervical fusion/foraminotomy  2009    fusion c3-t1  . Atrial fibrillation ablation  2000    Dr Omelia Blackwater at [facility]  . Appendectomy  ~ 1965  . Tonsillectomy  1959  . Pulmonary vein ablation  2001    Dr Omelia Blackwater at [facility] History  Problem Relation Age of Onset  . CAD Neg Hx     No hx early CAD   History  Substance Use Topics  . Smoking status: Former Smoker -- 1.50 packs/day for 43 years    Types: Cigarettes    Quit date:  05/06/2007  . Smokeless tobacco: Never Used  . Alcohol Use: Yes     Comment: 09/01/2013 "might have a drink a couple times/yr"    Review of Systems ROS reviewed and all otherwise negative except for mentioned in HPI    Allergies  Codeine and Other  Home Medications   No current outpatient prescriptions on file. BP 94/62  Pulse 88  Temp(Src) 97.6 F (36.4 C) (Oral)  Resp 18  Ht 5' 7"  (1.702 m)  Wt 180 lb 1.9 oz (81.7 kg)  BMI 28.20 kg/m2  SpO2 97% Vitals reviewed Physical Exam Physical Examination: General appearance - alert, well appearing, and in no distress Mental status - alert, oriented to person, place, and time Eyes - no conjunctival injection, no scleral icterus Mouth - mucous membranes moist, pharynx normal without lesions Chest - clear  to auscultation, no wheezes, rales or rhonchi, symmetric air entry Heart - normal rate, regular rhythm, normal S1, S2, no murmurs, rubs, clicks or gallops Abdomen - soft, nontender, nondistended, no masses or organomegaly Extremities - peripheral pulses normal, no pedal edema, no clubbing or cyanosis Skin - normal coloration and turgor, no rashes  ED Course  Procedures (including critical care time) Labs Review Labs Reviewed  CBC - Abnormal; Notable for the following:    RBC 3.91 (*)    HCT 37.2 (*)    MCH 34.3 (*)    All other components within normal limits  BASIC METABOLIC PANEL - Abnormal; Notable for the following:    Glucose, Bld 281 (*)    BUN 25 (*)    GFR calc non Af Amer 71 (*)    GFR calc Af Amer 82 (*)    All other components within normal limits  PRO B NATRIURETIC PEPTIDE - Abnormal; Notable for the following:    Pro B Natriuretic peptide (BNP) 166.2 (*)    All other components within normal limits  SEDIMENTATION RATE - Abnormal; Notable for the following:    Sed Rate 23 (*)    All other components within normal limits  HEPATIC FUNCTION PANEL - Abnormal; Notable for the following:    AST 63 (*)    ALT 106 (*)    All other components within normal limits  CBC - Abnormal; Notable for the following:    RBC 3.62 (*)    Hemoglobin 12.4 (*)    HCT 34.9 (*)    MCH 34.3 (*)    All other components within normal limits  BASIC METABOLIC PANEL - Abnormal; Notable for the following:    Glucose, Bld 154 (*)    BUN 28 (*)    GFR calc non Af Amer 66 (*)    GFR calc Af Amer 77 (*)    All other components within normal limits  GLUCOSE, CAPILLARY - Abnormal; Notable for the following:    Glucose-Capillary 172 (*)    All other components within normal limits  GLUCOSE, CAPILLARY - Abnormal; Notable for the following:    Glucose-Capillary 202 (*)    All other components within normal limits  GLUCOSE, CAPILLARY - Abnormal; Notable for the following:    Glucose-Capillary 145  (*)    All other components within normal limits  GLUCOSE, CAPILLARY - Abnormal; Notable for the following:    Glucose-Capillary 204 (*)    All other components within normal limits  LIPASE, BLOOD  D-DIMER, QUANTITATIVE  TROPONIN I  TROPONIN I  TSH  T4, FREE  TROPONIN I  BASIC METABOLIC PANEL  CBC  I-STAT  TROPOININ, ED   Imaging Review Dg Chest 2 View  09/01/2013   CLINICAL DATA:  Chest pain  EXAM: CHEST  2 VIEW  COMPARISON:  Portable chest x-ray at June 26, 2013.  FINDINGS: The lungs are adequately inflated. There is no focal infiltrate. There is a 7 mm diameter soft tissue density nodule projecting over the posterior aspect of the right ninth rib on the frontal film. This may reflect a mildly engorged pulmonary vein on end, but it is more conspicuous than on the previous study.  The cardiac silhouette has decreased in size since the previous study consistent with a decrease in the known pericardial effusion. The pulmonary vascularity is prominent centrally. The mediastinum is normal in width. The descending thoracic aorta demonstrates mild tortuosity. There is no pleural effusion or pneumothorax. The observed portions of the bony thorax exhibit no acute abnormalities. There are stable calcifications in the right axilla.  IMPRESSION: 1. There is no evidence of pulmonary edema or pneumonia. 2. There is a 7 mm diameter soft tissue density nodule projecting over the posterior aspect of the right ninth rib on the frontal view only. This may reflect an engorged pulmonary vessel but a true nodule is not excluded though none was demonstrated here in the past either on plain film or on the CT scan of June 26, 2013. A followup PA and lateral chest x-ray in 3-4 weeks is recommended. Repeat chest CT scanning may be ultimately needed.   Electronically Signed   By: David  Martinique   On: 09/01/2013 12:45   Ct Chest Wo Contrast  09/01/2013   CLINICAL DATA:  Chest pain.  History of pericardial effusion.   EXAM: CT CHEST WITHOUT CONTRAST  TECHNIQUE: Multidetector CT imaging of the chest was performed following the standard protocol without IV contrast.  COMPARISON:  DG CHEST 2 VIEW dated 09/01/2013; CT CHEST W/CM dated 09/21/2004  FINDINGS: Thoracic aorta is normal in caliber. Atherosclerotic vascular changes are noted. Heart size is normal. Moderate pericardial effusion remains. Pericardial effusion has increased slightly in size from prior study.  Shotty mediastinal lymph nodes. No pathologic adenopathy noted using CT size criteria. Thoracic esophagus is normal.  Large airways are patent. Calcified right upper lobe pulmonary nodule consistent granulomas noted. Previously identified nodular density projected over the right lung base represents nipple shadow. No pulmonary nodule noted. Minimal basilar atelectasis. Apical pleural parenchymal thickening consistent with scarring.  Adrenals normal. Visualized upper abdominal visceral unremarkable. Splenosis. Shotty retroperitoneal lymph nodes present.  Thyroid is normal. No supraclavicular or axillary lymph nodes are noted. Chest wall is intact. Degenerative changes thoracic spine.  IMPRESSION: 1. Persistent pericardial effusion. Pericardial effusion is moderate in size and has increased in size from prior study . 2. Previously identified nodular density projected over right lung base represents prominent nipple shadow. No significant pulmonary disease noted.   Electronically Signed   By: Marcello Moores  Register   On: 09/01/2013 18:56     EKG Interpretation None      Date: 09/01/2013  Rate: 100  Rhythm: sinus tachycardia  QRS Axis: normal  Intervals: normal  ST/T Wave abnormalities: nonspecific ST/T changes  Conduction Disutrbances:none  Narrative Interpretation: q waves in inferior leads  Old EKG Reviewed: unchanged   MDM   Final diagnoses:  Chest pain  CAD (coronary artery disease)  Pericardial effusion  Diabetes mellitus  Atrial fibrillation  HLD  (hyperlipidemia)  HTN (hypertension)    Pt presenting with c/o chest pain, 2 months after Nstemi and stent to RCA.  EKG reassuring, no chest pain in the ED, initial troponin negative.  D/w cardiology-they have seen patient in the ED and will be admitted to their service    Threasa Beards, MD 09/02/13 [phone]

## 2013-09-01 NOTE — Progress Notes (Signed)
MR Clydene Laming returned to exercise at cardiac rehab this morning and experienced mid sternal chest discomfort while doing weights.with diaphoresis Blood pressure 90/60.  Telemetry rhythm Sinus. Mr Dolata took his sublingual nitroglycerin out of his pocket but we advised him not to take it. Patient placed on 4l/min of oxygen.  Mr Digirolamo's chest pain resolved after resting for a few minutes.  Oxygen saturation 94% on 4l/min.  Oxygen saturation recheck 96%. Oxygen decreased to 2l/min.  Melina Copa PAC called and notified. 12 lead ECG obtained. Advised that patient be taken to the ED for further evaluation. Patient wife called and notified of events. Patient transferred to the Ed via stretcher on the [place]. Report given to ED RN.

## 2013-09-01 NOTE — ED Notes (Signed)
Pt returned from xray

## 2013-09-02 ENCOUNTER — Ambulatory Visit (HOSPITAL_BASED_OUTPATIENT_CLINIC_OR_DEPARTMENT_OTHER): Payer: 59 | Attending: Cardiovascular Disease

## 2013-09-02 DIAGNOSIS — E119 Type 2 diabetes mellitus without complications: Secondary | ICD-10-CM

## 2013-09-02 DIAGNOSIS — J9600 Acute respiratory failure, unspecified whether with hypoxia or hypercapnia: Secondary | ICD-10-CM

## 2013-09-02 DIAGNOSIS — I319 Disease of pericardium, unspecified: Secondary | ICD-10-CM

## 2013-09-02 LAB — TSH: TSH: 2.394 u[IU]/mL (ref 0.350–4.500)

## 2013-09-02 LAB — CBC
HEMATOCRIT: 34.9 % — AB (ref 39.0–52.0)
Hemoglobin: 12.4 g/dL — ABNORMAL LOW (ref 13.0–17.0)
MCH: 34.3 pg — ABNORMAL HIGH (ref 26.0–34.0)
MCHC: 35.5 g/dL (ref 30.0–36.0)
MCV: 96.4 fL (ref 78.0–100.0)
Platelets: 184 10*3/uL (ref 150–400)
RBC: 3.62 MIL/uL — ABNORMAL LOW (ref 4.22–5.81)
RDW: 14.7 % (ref 11.5–15.5)
WBC: 4.4 10*3/uL (ref 4.0–10.5)

## 2013-09-02 LAB — GLUCOSE, CAPILLARY
GLUCOSE-CAPILLARY: 204 mg/dL — AB (ref 70–99)
GLUCOSE-CAPILLARY: 181 mg/dL — AB (ref 70–99)
Glucose-Capillary: 145 mg/dL — ABNORMAL HIGH (ref 70–99)
Glucose-Capillary: 174 mg/dL — ABNORMAL HIGH (ref 70–99)

## 2013-09-02 LAB — BASIC METABOLIC PANEL
BUN: 28 mg/dL — AB (ref 6–23)
CHLORIDE: 102 meq/L (ref 96–112)
CO2: 19 mEq/L (ref 19–32)
Calcium: 9.5 mg/dL (ref 8.4–10.5)
Creatinine, Ser: 1.17 mg/dL (ref 0.50–1.35)
GFR calc non Af Amer: 66 mL/min — ABNORMAL LOW (ref >90)
GFR, EST AFRICAN AMERICAN: 77 mL/min — AB (ref >90)
GLUCOSE: 154 mg/dL — AB (ref 70–99)
Potassium: 4.3 mEq/L (ref 3.7–5.3)
Sodium: 139 mEq/L (ref 137–147)

## 2013-09-02 LAB — T4, FREE: Free T4: 1.34 ng/dL (ref 0.80–1.80)

## 2013-09-02 LAB — TROPONIN I: Troponin I: <0.3 ng/mL (ref <0.30)

## 2013-09-02 NOTE — Consult Note (Signed)
[address]       [place] [address]             [phone]        Kristopher [name] [place] Medical Record #[id number] Date of Birth: 1951/04/03  Referring: Shelva Majestic, MD Primary Care: Allyne Gee, MD  Chief Complaint:    Chief Complaint  Patient presents with  . Chest Pain    History of Present Illness:     63 year old male with a history of diabetes, hypertension, atrial fibrillation status post ablation, and a chronic small pericardial effusion which has been documented since 2006.  At that time, he was hospitalized at [facility] with pericarditis and initially was treated with steroids.  Unfortunately, his blood sugars became erratic and difficult to control, and the steroids were discontinued. He was switched to po NSAIDs and symptoms improved.  A CT during that admission revealed a small pericardial effusion. He was discharged home and had no further similar issues.   He was in his usual state of health until earlier this year, when he developed chest pain and generalized malaise.  He was admitted to [facility] with a  NSTEMI on 06/27/2013. He was found to have high grade stenoses of the RCA and underwent stenting and was started on aspirin and Brilinta.  During that admission, he had a CT angio that showed a moderate pericardial effusion but no pulmonary embolism. Apparently a few days after discharge, he developed recurrent chest pain leading to readmission and he underwent repeat cardiac catheterization by Dr. Emelda Brothers on 07/10/13. His RCA stent was widely patent and ejection fraction was 40%. He was started on Imdur and discharged the following day.  He has had several subsequent office visits with cardiology, and related continued intermittent episodes of chest discomfort. His beta blocker and Imdur dosages were titrated and he was scheduled for an exercise Myoview study. This was performed on 08/27/2103 and the nuclear portion of the study showed normal  perfusion.  However, he developed chest pain with exercise and the test was discontinued early.  Repeat cardiac catheterization was recommended, and was performed on 08/28/2013.  This showed no significant restenosis with a widely patent RCA stent, and normal LAD and circumflex systems.  EF was 55% with preserved LV function. He was started on Ranexa.   The patient was at cardiac rehab on the date of admission, and developed recurrent chest pain with associated diaphoresis while lifting weights. He was directed to the ER for further evaluation and was subsequently admitted for workup. Initial troponin was negative, and EKG showed no acute changes.  The etiology for his chest pain is unclear, and in light of his history of chronic pericardial effusion and elevated sed rate, treatment with NSAIDS and colchicine was initiated for presumed pericarditis. Steroids were considered, but have not been ordered yet due to the patient's diabetes.  A CT of the chest confirmed a moderate pericardial effusion increased from previous films, along with a nodular right lung density seen on previous studies. TCTS has been consulted for consideration of possible pericardial window.  According to the patient, his symptoms have been occuring daily and have severely affected his activity level.  He has had continued episodic chest discomfort since hospitalization, both with exertion and at rest.  He also relates shortness of breath with exertion. A 2D echocardiogram has been performed today, but results are not available at this time.    Past Medical  History: Past Medical History  Diagnosis Date  . Herniated cervical disc     c2-T1 fusion  . Atrial fibrillation     a. s/p PVI X2 by Dr Omelia Blackwater at [facility] around 2000.  Marland Kitchen CAD (coronary artery disease)     a. NSTEMI (06/2013):  LHC (06/27/2013):  pLAD 10-20, RCA 90, EF 45-50%, inf HK.  PCI:  Xience Alpine (3 x 23 mm) DES to the RCA. b. Relook cath 07/10/13 - patent stent. Imdur added.  c. Recurrent CP after that - abnormal nuc 07/2017 with chest pain, dyspnea, hypotensive response. Rest images with mid-basal inferior thinning. No stress images due to abrupt d/c of test. Relook cath 3/12 - no significant r  . Ischemic cardiomyopathy     a. Echo (06/28/2011): Inferior and inferoseptal akinesis, EF 34%, grade 2 diastolic dysfunction, MAC, small effusion (no tamponade). B. EF 40% by echo 06/2013, cath 07/2013, 55% by cath 08/2013 with subtle mild mid inferior hypocontractility.  Marland Kitchen HTN (hypertension)   . HLD (hyperlipidemia)   . Pericardial effusion     a. Chronic since hx of pericarditis in 2004. b. Mod by echo 06/2013, small by echo 06/2013.  Marland Kitchen Depression   . Anginal pain   . NSTEMI (non-ST elevated myocardial infarction) 06-27-2013  . Pneumonia ~ 2005  . Type II diabetes mellitus   . Osteoarthritis     "neck and lower back" (09/01/2013)  . DJD (degenerative joint disease) of cervical spine     "3 herniated discs" (09/01/2013)  . DJD (degenerative joint disease), lumbosacral     "bulging L5-S1" (09/01/2013)  . Chronic back pain greater than 3 months duration      Past Surgical History: Past Surgical History  Procedure Laterality Date  . Posterior cervical fusion/foraminotomy  2009    fusion c3-t1  . Atrial fibrillation ablation  2000    Dr Omelia [facility] at [facility]  . Appendectomy  ~ 1965  . Tonsillectomy  1959  . Pulmonary vein ablation  2001    Dr Omelia Blackwater at [place] History: History   Social History  . Marital Status: Married    Spouse Name: N/A    Number of Children: N/A  . Years of Education: N/A   Occupational History  . 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 Smoking status: Former Smoker -- 1.50 packs/day for 43 years    Types: Cigarettes    Quit date: 05/06/2007  . Smokeless tobacco: Never Used  . Alcohol Use: Yes     Comment: 09/01/2013 "might have a drink a couple times/yr"  . Drug Use: No  . Sexual Activity: Not  Currently   Other Topics Concern  . Not on file   Social History Narrative  . No narrative on file    Allergies: Allergies  Allergen Reactions  . Codeine Nausea And Vomiting  . Other Nausea And Vomiting    The patient states that he has had  a reaction to all narcotics. He can take them though.    Medications: Current Facility-Administered Medications  Medication Dose Route Frequency Provider Last Rate Last Dose  . 0.9 %  sodium chloride infusion  250 mL Intravenous PRN Dayna N Dunn, PA-C      . aspirin EC tablet 81 mg  81 mg Oral Daily Dayna N Dunn, PA-C   81 mg at 09/02/13 1041  . baclofen (LIORESAL) tablet 10 mg  10 mg Oral TID  Dayna N Dunn, PA-C   10 mg at 09/02/13 1042  . buPROPion (WELLBUTRIN XL) 24 hr tablet 150 mg  150 mg Oral Daily Dayna N Dunn, PA-C   150 mg at 09/02/13 1042  . Canagliflozin TABS 300 mg  300 mg Oral Daily Dayna N Dunn, PA-C   300 mg at 09/02/13 1042  . cholecalciferol (VITAMIN D) tablet 1,000 Units  1,000 Units Oral Daily Dayna N Dunn, PA-C   1,000 Units at 09/02/13 1043  . colchicine tablet 0.6 mg  0.6 mg Oral BID Dayna N Dunn, PA-C   0.6 mg at 09/02/13 1043  . cyclobenzaprine (FLEXERIL) tablet 10 mg  10 mg Oral TID PRN Dayna N Dunn, PA-C      . gabapentin (NEURONTIN) capsule 600 mg  600 mg Oral BID Josue Hector, MD   600 mg at 09/02/13 0859  . gabapentin (NEURONTIN) capsule 900 mg  900 mg Oral QHS Josue Hector, MD   900 mg at 09/01/13 2217  . glimepiride (AMARYL) tablet 2 mg  2 mg Oral BID WC Dayna N Dunn, PA-C   2 mg at 09/02/13 1043  . ibuprofen (ADVIL,MOTRIN) tablet 800 mg  800 mg Oral TID Dayna N Dunn, PA-C   800 mg at 09/02/13 1044  . insulin aspart (novoLOG) injection 0-9 Units  0-9 Units Subcutaneous TID WC Dayna N Dunn, PA-C   3 Units at 09/02/13 1317  . isosorbide mononitrate (IMDUR) 24 hr tablet 60 mg  60 mg Oral Daily Dayna N Dunn, PA-C   60 mg at 09/02/13 1044  . linagliptin (TRADJENTA) tablet 5 mg  5 mg Oral Daily Dayna N Dunn, PA-C   5  mg at 09/02/13 1045  . metoprolol succinate (TOPROL-XL) 24 hr tablet 100 mg  100 mg Oral Daily Dayna N Dunn, PA-C   100 mg at 09/02/13 1045  . nitroGLYCERIN (NITROSTAT) SL tablet 0.4 mg  0.4 mg Sublingual Q5 Min x 3 PRN Dayna N Dunn, PA-C      . pantoprazole (PROTONIX) EC tablet 40 mg  40 mg Oral Daily Dayna N Dunn, PA-C   40 mg at 09/02/13 1045  . ranolazine (RANEXA) 12 hr tablet 500 mg  500 mg Oral BID Dayna N Dunn, PA-C   500 mg at 09/02/13 1045  . sodium chloride 0.9 % injection 3 mL  3 mL Intravenous Q12H Dayna N Dunn, PA-C   3 mL at 09/02/13 1047  . sodium chloride 0.9 % injection 3 mL  3 mL Intravenous PRN Dayna N Dunn, PA-C      . Ticagrelor (BRILINTA) tablet 90 mg  90 mg Oral BID Dayna N Dunn, PA-C   90 mg at 09/02/13 1046  . vitamin C (ASCORBIC ACID) tablet 1,000 mg  1,000 mg Oral Daily Dayna N Dunn, PA-C   1,000 mg at 09/02/13 1046    Prescriptions prior to admission  Medication Sig Dispense Refill  . Ascorbic Acid (VITAMIN C) 1000 MG tablet Take 1,000 mg by mouth daily.      Marland Kitchen aspirin EC 81 MG tablet Take 81 mg by mouth daily.      Marland Kitchen atorvastatin (LIPITOR) 80 MG tablet Take 80 mg by mouth daily.      . B Complex-Biotin-FA (VITAMIN B50 COMPLEX PO) Take 1 capsule by mouth daily.      . baclofen (LIORESAL) 10 MG tablet Take 10 mg by mouth 3 (three) times daily.      Marland Kitchen buPROPion (WELLBUTRIN XL) 150 MG 24  hr tablet Take 150 mg by mouth daily.      . Canagliflozin (INVOKANA) 300 MG TABS Take 300 mg by mouth daily.      . cholecalciferol (VITAMIN D) 1000 UNITS tablet Take 1,000 Units by mouth daily.      . colchicine 0.6 MG tablet Take 0.6 mg by mouth 2 (two) times daily.      . cyclobenzaprine (FLEXERIL) 10 MG tablet Take 10 mg by mouth 3 (three) times daily as needed for muscle spasms.      . fenofibrate 54 MG tablet Take 54 mg by mouth at bedtime.      . gabapentin (NEURONTIN) 300 MG capsule Take 600-900 mg by mouth 3 (three) times daily. Take 639m in the morning, 6043min the  afternoon, and 90091mt bedtime.      . gMarland Kitchenimepiride (AMARYL) 2 MG tablet Take 2 mg by mouth 2 (two) times daily.       . iMarland Kitchenuprofen (ADVIL,MOTRIN) 200 MG tablet Take 800 mg by mouth every 6 (six) hours as needed (pain).      . isosorbide mononitrate (IMDUR) 60 MG 24 hr tablet Take 60 mg by mouth daily.      . metoprolol succinate (TOPROL-XL) 100 MG 24 hr tablet Take 100 mg by mouth daily. Take with or immediately following a meal.      . nitroGLYCERIN (NITROSTAT) 0.4 MG SL tablet Place 0.4 mg under the tongue every 5 (five) minutes as needed for chest pain.      . ranolazine (RANEXA) 500 MG 12 hr tablet Take 500 mg by mouth 2 (two) times daily.      . sitaGLIPtin-metformin (JANUMET) 50-500 MG per tablet Take 1 tablet by mouth 2 (two) times daily with a meal.      . Ticagrelor (BRILINTA) 90 MG TABS tablet Take 90 mg by mouth 2 (two) times daily.        Family History: Family History  Problem Relation Age of Onset  . CAD Neg Hx     No hx early CAD     Review of Systems:     Cardiac Review of Systems: Y or N  Chest Pain [ x  ]  Resting SOB [   ] Exertional SOB  [ x ]  Orthopnea [  ]   Pedal Edema [   ]    Palpitations [  ] Syncope  [  ]   Presyncope [   ]  General Review of Systems: [Y] = yes [  ]=no Constitional: recent weight change [  ]; anorexia [  ]; fatigue [  ]; nausea [  ]; night sweats [  ]; fever [  ]; or chills [  ]                                                               Dental: poor dentition[  ];   Eye : blurred vision [  ]; diplopia [   ]; vision changes [  ];  Amaurosis fugax[  ]; Resp: cough [  ];  wheezing[  ];  hemoptysis[  ]; shortness of breath[  ]; paroxysmal nocturnal dyspnea[  ]; dyspnea on exertion[ x ]; or orthopnea[  ];  GI:  gallstones[  ], vomiting[  ];  dysphagia[  ]; melena[  ];  hematochezia [  ]; heartburn[  ];   Hx of  Colonoscopy[  ]; GU: kidney stones [  ]; hematuria[  ];   dysuria [  ];  nocturia[  ];  history of     obstruction [  ]; urinary  frequency [  ]             Skin: rash, swelling[  ];, hair loss[  ];  peripheral edema[  ];  or itching[  ]; Musculosketetal: myalgias[  ];  joint swelling[  ];  joint erythema[  ];  joint pain[  ];  back pain[  ];  Heme/Lymph: bruising[  ];  bleeding[  ];  anemia[  ];  Neuro: TIA[  ];  headaches[  ];  stroke[  ];  vertigo[  ];  seizures[  ];   paresthesias[  ];  difficulty walking[  ];  Psych:depression[  ]; anxiety[  ];  Endocrine: diabetes[ x- difficult to control sugars since MI, A1C increased from 5->8 ];  thyroid dysfunction[  ];     Physical Exam: BP 115/65  Pulse 90  Temp(Src) 98 F (36.7 C) (Oral)  Resp 18  Ht 5' 7"  (1.702 m)  Wt 180 lb 1.9 oz (81.7 kg)  BMI 28.20 kg/m2  SpO2 96%  General appearance: alert, cooperative and no distress Heart: regular rate and rhythm, no rubs Lungs: Clear to auscultation Abdomen: Soft, nontender, nondistended, +BS Extremities: No edema, palpable pulses    Diagnostic Studies & Laboratory data:     Recent Radiology Findings:   Dg Chest 2 View  09/01/2013   CLINICAL DATA:  Chest pain  EXAM: CHEST  2 VIEW  COMPARISON:  Portable chest x-ray at June 26, 2013.  FINDINGS: The lungs are adequately inflated. There is no focal infiltrate. There is a 7 mm diameter soft tissue density nodule projecting over the posterior aspect of the right ninth rib on the frontal film. This may reflect a mildly engorged pulmonary vein on end, but it is more conspicuous than on the previous study.  The cardiac silhouette has decreased in size since the previous study consistent with a decrease in the known pericardial effusion. The pulmonary vascularity is prominent centrally. The mediastinum is normal in width. The descending thoracic aorta demonstrates mild tortuosity. There is no pleural effusion or pneumothorax. The observed portions of the bony thorax exhibit no acute abnormalities. There are stable calcifications in the right axilla.  IMPRESSION: 1. There is no  evidence of pulmonary edema or pneumonia. 2. There is a 7 mm diameter soft tissue density nodule projecting over the posterior aspect of the right ninth rib on the frontal view only. This may reflect an engorged pulmonary vessel but a true nodule is not excluded though none was demonstrated here in the past either on plain film or on the CT scan of June 26, 2013. A followup PA and lateral chest x-ray in 3-4 weeks is recommended. Repeat chest CT scanning may be ultimately needed.   Electronically Signed   By: David  Martinique   On: 09/01/2013 12:45   Ct Chest Wo Contrast  09/01/2013   CLINICAL DATA:  Chest pain.  History of pericardial effusion.  EXAM: CT CHEST WITHOUT CONTRAST  TECHNIQUE: Multidetector CT imaging of the chest was performed following the standard protocol without IV contrast.  COMPARISON:  DG CHEST 2 VIEW dated 09/01/2013; CT CHEST W/CM dated 09/21/2004  FINDINGS: Thoracic aorta is normal in caliber. Atherosclerotic vascular changes  are noted. Heart size is normal. Moderate pericardial effusion remains. Pericardial effusion has increased slightly in size from prior study.  Shotty mediastinal lymph nodes. No pathologic adenopathy noted using CT size criteria. Thoracic esophagus is normal.  Large airways are patent. Calcified right upper lobe pulmonary nodule consistent granulomas noted. Previously identified nodular density projected over the right lung base represents nipple shadow. No pulmonary nodule noted. Minimal basilar atelectasis. Apical pleural parenchymal thickening consistent with scarring.  Adrenals normal. Visualized upper abdominal visceral unremarkable. Splenosis. Shotty retroperitoneal lymph nodes present.  Thyroid is normal. No supraclavicular or axillary lymph nodes are noted. Chest wall is intact. Degenerative changes thoracic spine.  IMPRESSION: 1. Persistent pericardial effusion. Pericardial effusion is moderate in size and has increased in size from prior study . 2. Previously  identified nodular density projected over right lung base represents prominent nipple shadow. No significant pulmonary disease noted.   Electronically Signed   By: Marcello Moores  Register   On: 09/01/2013 18:56     Recent Lab Findings: Lab Results  Component Value Date   WBC 4.4 09/02/2013   HGB 12.4* 09/02/2013   HCT 34.9* 09/02/2013   PLT 184 09/02/2013   GLUCOSE 154* 09/02/2013   CHOL 175 06/27/2013   TRIG 152* 08/07/2013   HDL 31* 06/27/2013   LDLCALC 49 08/07/2013   ALT 106* 09/01/2013   AST 63* 09/01/2013   NA 139 09/02/2013   K 4.3 09/02/2013   CL 102 09/02/2013   CREATININE 1.17 09/02/2013   BUN 28* 09/02/2013   CO2 19 09/02/2013   TSH 2.394 09/01/2013   INR 0.91 08/26/2013      Assessment / Plan:   The patient is a 63 year old male with a known chronic pericardial effusion, present since 2006, when he was hospitalized with pericarditis.  He sustained a NSTEMI in January, and has had ongoing issues with chest discomfort and dyspnea since that time. Repeat catheterizations have shown a patent stent to the RCA.  CT of the chest shows a moderate pericardial effusion, enlarged from previous study.  Sed rate is mildly elevated at 23. He is presently being treated for probable pericarditis with NSAIDs and colchicine without relief of symptoms. Steroid therapy has been avoided due to his past issues with steroids significantly worsening his diabetes control. TCTS has been consulted for consideration of pericardial window.  A 2D echocardiogram has been done, and the results are pending at this time.  Dr. Roxan Hockey will see the patient this afternoon and review his studies and make further recommendations.  Would continue medical therapy for now.   Kristopher Mellin H. 09/02/2013 3:10 PM

## 2013-09-02 NOTE — Consult Note (Signed)
Patient seen and examined, agree with history and physical as noted above.   On exam he is alert and oriented and in no distress. There is no paradoxical pulse and no friction rub and no peripheral edema  His CT and echo were reviewed. Called a moderate effusion by Radiology but I think that is generous. Echo showed a small effusion with no signs of tamponade. Reviewing the echo there did appear to be some stranding in the fluid.  My main concern is that his pain is not in any way typical for pericarditis. The pain is only associated with exertion. He has not had any pain at rest. Also there is no positional component to the pain. He says that it is always relieved with nitroglycerin. He says he hasn't had any pain today, but he has not been out of bed.  I do not think a pericardial window will be of much help in this setting. I doubt he has a purulent pericarditis given he is afebrile with a normal WBC. There is no evidence of hemodynamic compromise by exam or echo, and his symptoms are atypical for pericarditis.  Would continue to treat with colchicine and NSAIDs. If the NSAIDs fail consider prednisone.   We also need to consider alternative explanations, e.g. Prinzmetal's or esophageal spasm, since there is no anatomic coronary lesion to explain his pain.  Discussed with Dr. Claiborne Billings

## 2013-09-02 NOTE — Progress Notes (Signed)
Echocardiogram 2D Echocardiogramhe has been performed.  Kristopher [name], Kristopher [name] 09/02/2013, 2:36 PM

## 2013-09-02 NOTE — Progress Notes (Signed)
Came to visit patient on behalf of [employer] program for [employer]. However, doctor was at bedside. Will come back at later time.  Marthenia Rolling, MSN- RN,BSN- [facility] [phone]

## 2013-09-02 NOTE — Progress Notes (Signed)
Subjective:  Recurrent pain yesterday leading to readmission.  Objective:   Vital Signs in the last 24 hours: Temp:  [97.4 F (36.3 C)-98.4 F (36.9 C)] 98 F (36.7 C) (03/17 0500) Pulse Rate:  [80-99] 80 (03/17 0500) Resp:  [16-19] 18 (03/17 0500) BP: (90-105)/(56-74) 102/56 mmHg (03/17 0500) SpO2:  [91 %-99 %] 96 % (03/17 0500) Weight:  [180 lb 1.9 oz (81.7 kg)] 180 lb 1.9 oz (81.7 kg) (03/16 1153)  Intake/Output from previous day:    Medications: . aspirin EC  81 mg Oral Daily  . baclofen  10 mg Oral TID  . buPROPion  150 mg Oral Daily  . Canagliflozin  300 mg Oral Daily  . cholecalciferol  1,000 Units Oral Daily  . colchicine  0.6 mg Oral BID  . gabapentin  600 mg Oral BID  . gabapentin  900 mg Oral QHS  . glimepiride  2 mg Oral BID WC  . ibuprofen  800 mg Oral TID  . insulin aspart  0-9 Units Subcutaneous TID WC  . isosorbide mononitrate  60 mg Oral Daily  . linagliptin  5 mg Oral Daily  . metoprolol succinate  100 mg Oral Daily  . pantoprazole  40 mg Oral Daily  . pneumococcal 23 valent vaccine  0.5 mL Intramuscular Tomorrow-1000  . ranolazine  500 mg Oral BID  . sodium chloride  3 mL Intravenous Q12H  . Ticagrelor  90 mg Oral BID  . vitamin C  1,000 mg Oral Daily       Physical Exam:   General appearance: no distress Neck: no JVD, supple, symmetrical, trachea midline and thyroid not enlarged, symmetric, no tenderness/mass/nodules Lungs: clear to auscultation bilaterally Heart: regular rate and rhythm; no definitive rub Abdomen: soft, non-tender; bowel sounds normal; no masses,  no organomegaly; No definitive HJR Extremities: no edema, redness or tenderness in the calves or thighs Pulses: 2+ and symmetric Neurologic: Grossly normal   Rate: 92  Rhythm: normal sinus rhythm  Lab Results:    Recent Labs  09/01/13 1213 09/02/13 0415  NA 138 139  K 4.1 4.3  CL 99 102  CO2 22 19  GLUCOSE 281* 154*  BUN 25* 28*  CREATININE 1.10 1.17   CBC   Component Value Date/Time   WBC 4.4 09/02/2013 0415   RBC 3.62* 09/02/2013 0415   HGB 12.4* 09/02/2013 0415   HCT 34.9* 09/02/2013 0415   PLT 184 09/02/2013 0415   MCV 96.4 09/02/2013 0415   MCH 34.3* 09/02/2013 0415   MCHC 35.5 09/02/2013 0415   RDW 14.7 09/02/2013 0415   LYMPHSABS 1.3 07/09/2013 1355   MONOABS 0.5 07/09/2013 1355   EOSABS 0.2 07/09/2013 1355   BASOSABS 0.0 07/09/2013 1355     Recent Labs  09/01/13 2044 09/02/13 0415  TROPONINI <0.30 <0.30   Hepatic Function Panel  Recent Labs  09/01/13 1523  PROT 6.8  ALBUMIN 4.0  AST 63*  ALT 106*  ALKPHOS 79  BILITOT 0.7  BILIDIR <0.2  IBILI NOT CALCULATED   No results found for this basename: INR,  in the last 72 hours BNP (last 3 results)  Recent Labs  09/01/13 1523  PROBNP 166.2*    Lipid Panel     Component Value Date/Time   CHOL 175 06/27/2013 0630   TRIG 152* 08/07/2013 0857   TRIG 126 06/27/2013 0630   HDL 31* 06/27/2013 0630   CHOLHDL 5.6 06/27/2013 0630   VLDL 25 06/27/2013 0630   LDLCALC 49 08/07/2013 0857  LDLCALC 119* 06/27/2013 0630      Imaging:  Dg Chest 2 View  09/01/2013   CLINICAL DATA:  Chest pain  EXAM: CHEST  2 VIEW  COMPARISON:  Portable chest x-ray at June 26, 2013.  FINDINGS: The lungs are adequately inflated. There is no focal infiltrate. There is a 7 mm diameter soft tissue density nodule projecting over the posterior aspect of the right ninth rib on the frontal film. This may reflect a mildly engorged pulmonary vein on end, but it is more conspicuous than on the previous study.  The cardiac silhouette has decreased in size since the previous study consistent with a decrease in the known pericardial effusion. The pulmonary vascularity is prominent centrally. The mediastinum is normal in width. The descending thoracic aorta demonstrates mild tortuosity. There is no pleural effusion or pneumothorax. The observed portions of the bony thorax exhibit no acute abnormalities. There are stable  calcifications in the right axilla.  IMPRESSION: 1. There is no evidence of pulmonary edema or pneumonia. 2. There is a 7 mm diameter soft tissue density nodule projecting over the posterior aspect of the right ninth rib on the frontal view only. This may reflect an engorged pulmonary vessel but a true nodule is not excluded though none was demonstrated here in the past either on plain film or on the CT scan of June 26, 2013. A followup PA and lateral chest x-ray in 3-4 weeks is recommended. Repeat chest CT scanning may be ultimately needed.   Electronically Signed   By: David  Martinique   On: 09/01/2013 12:45   Ct Chest Wo Contrast  09/01/2013   CLINICAL DATA:  Chest pain.  History of pericardial effusion.  EXAM: CT CHEST WITHOUT CONTRAST  TECHNIQUE: Multidetector CT imaging of the chest was performed following the standard protocol without IV contrast.  COMPARISON:  DG CHEST 2 VIEW dated 09/01/2013; CT CHEST W/CM dated 09/21/2004  FINDINGS: Thoracic aorta is normal in caliber. Atherosclerotic vascular changes are noted. Heart size is normal. Moderate pericardial effusion remains. Pericardial effusion has increased slightly in size from prior study.  Shotty mediastinal lymph nodes. No pathologic adenopathy noted using CT size criteria. Thoracic esophagus is normal.  Large airways are patent. Calcified right upper lobe pulmonary nodule consistent granulomas noted. Previously identified nodular density projected over the right lung base represents nipple shadow. No pulmonary nodule noted. Minimal basilar atelectasis. Apical pleural parenchymal thickening consistent with scarring.  Adrenals normal. Visualized upper abdominal visceral unremarkable. Splenosis. Shotty retroperitoneal lymph nodes present.  Thyroid is normal. No supraclavicular or axillary lymph nodes are noted. Chest wall is intact. Degenerative changes thoracic spine.  IMPRESSION: 1. Persistent pericardial effusion. Pericardial effusion is moderate in  size and has increased in size from prior study . 2. Previously identified nodular density projected over right lung base represents prominent nipple shadow. No significant pulmonary disease noted.   Electronically Signed   By: Marcello Moores  Register   On: 09/01/2013 18:56      Assessment/Plan:   Active Problems:   Chest pain  Pt is well known to me. He is  s/p ACS in Jan 2015 and underwent successful DES stenting to RCA.  He has undergone 2 subsequent caths since with patent stent; last cath done last week by me. He has chronic pericardial effusion since suffering an episode of pericarditis ~ 8 yrs ago. Effusion is moderate and CT suggests increasing size. Echo to be done today. He has been on NSAIDx and colchicine without benefit. He was  dry at cath last week. Will ask TTS opinion for possible pericardial window due to recurrent symptomatology despite fairly maximal medical therapy.    Troy Sine, MD, [facility] 09/02/2013, 9:04 AM

## 2013-09-03 ENCOUNTER — Encounter (HOSPITAL_COMMUNITY): Payer: 59

## 2013-09-03 ENCOUNTER — Ambulatory Visit (HOSPITAL_COMMUNITY): Payer: 59

## 2013-09-03 DIAGNOSIS — R9430 Abnormal result of cardiovascular function study, unspecified: Secondary | ICD-10-CM

## 2013-09-03 DIAGNOSIS — I10 Essential (primary) hypertension: Secondary | ICD-10-CM

## 2013-09-03 DIAGNOSIS — I2589 Other forms of chronic ischemic heart disease: Secondary | ICD-10-CM

## 2013-09-03 DIAGNOSIS — I200 Unstable angina: Secondary | ICD-10-CM

## 2013-09-03 LAB — CBC
HCT: 32.7 % — ABNORMAL LOW (ref 39.0–52.0)
HEMOGLOBIN: 11.5 g/dL — AB (ref 13.0–17.0)
MCH: 33.8 pg (ref 26.0–34.0)
MCHC: 35.2 g/dL (ref 30.0–36.0)
MCV: 96.2 fL (ref 78.0–100.0)
PLATELETS: 159 10*3/uL (ref 150–400)
RBC: 3.4 MIL/uL — ABNORMAL LOW (ref 4.22–5.81)
RDW: 14.6 % (ref 11.5–15.5)
WBC: 4.4 10*3/uL (ref 4.0–10.5)

## 2013-09-03 LAB — BASIC METABOLIC PANEL
BUN: 25 mg/dL — ABNORMAL HIGH (ref 6–23)
CO2: 23 mEq/L (ref 19–32)
CREATININE: 1.33 mg/dL (ref 0.50–1.35)
Calcium: 9.1 mg/dL (ref 8.4–10.5)
Chloride: 104 mEq/L (ref 96–112)
GFR calc non Af Amer: 57 mL/min — ABNORMAL LOW (ref >90)
GFR, EST AFRICAN AMERICAN: 66 mL/min — AB (ref >90)
Glucose, Bld: 180 mg/dL — ABNORMAL HIGH (ref 70–99)
POTASSIUM: 3.7 meq/L (ref 3.7–5.3)
Sodium: 143 mEq/L (ref 137–147)

## 2013-09-03 LAB — GLUCOSE, CAPILLARY
GLUCOSE-CAPILLARY: 235 mg/dL — AB (ref 70–99)
Glucose-Capillary: 168 mg/dL — ABNORMAL HIGH (ref 70–99)
Glucose-Capillary: 218 mg/dL — ABNORMAL HIGH (ref 70–99)
Glucose-Capillary: 166 mg/dL — ABNORMAL HIGH (ref 70–99)

## 2013-09-03 MED ORDER — INSULIN ASPART 100 UNIT/ML ~~LOC~~ SOLN
2.0000 [IU] | Freq: Once | SUBCUTANEOUS | Status: AC
  Administered 2013-09-03: 2 [IU] via SUBCUTANEOUS

## 2013-09-03 MED ORDER — PREDNISONE 20 MG PO TABS
20.0000 mg | ORAL_TABLET | Freq: Every day | ORAL | Status: DC
  Administered 2013-09-04: 20 mg via ORAL
  Filled 2013-09-03 (×2): qty 1

## 2013-09-03 NOTE — Progress Notes (Signed)
Patient Profile: 63 y/o male followed by [facility], w/ h/o DM and known CAD, s/p ACS in Jan 2015 and underwent successful DES to RCA. He has undergone 2 subsequent caths since with patent stent. He also has a chronic pericardial effusion since suffering an episode of pericarditis ~ 8 yrs ago.  He was recently placed on NSAIDs and colchicine, for presumptive pericarditis without benefit. He was re-admitted 3/16 for evaluation of recurrent chest pain and dyspnea. A CT of the chest on 3/16 suggested the pericardial effusion was now moderate and had increased in size.  A repeat 2D echo yesterday demonstrated only a small pericardial effusion with little change, compared to prior study on 06/27/13.   He was seen by Dr. Roxan Hockey, [facility] surgeon, for evaluation of potential pericardial window. After examination and review of his echo findings, he was not convinced that his pain is due to pericarditis and feels that  The amount of pericardial effusion is not significant enough for surgical intervention.   Subjective: He had another episode of chest pain this am with radiation to the left upper extremity that was instantly relieved with SL NTG x 1. Denies any further pain. He recalls his first occurrence of pericarditis several years ago and remembers the pain exactly. He does not feel that this is pericarditis, as his symptoms are completely different.   Objective: Vital signs in last 24 hours: Temp:  [97.6 F (36.4 C)-99.3 F (37.4 C)] 99.3 F (37.4 C) (03/17 2100) Pulse Rate:  [85-93] 93 (03/18 1056) Resp:  [18] 18 (03/17 2100) BP: (94-112)/(62-67) 112/62 mmHg (03/18 1056) SpO2:  [94 %-97 %] 94 % (03/17 2100) Last BM Date: 09/02/13  Intake/Output from previous day:   Intake/Output this shift:    Medications Current Facility-Administered Medications  Medication Dose Route Frequency Provider Last Rate Last Dose  . 0.9 %  sodium chloride infusion  250 mL Intravenous PRN Dayna N Dunn, PA-C      .  aspirin EC tablet 81 mg  81 mg Oral Daily Dayna N Dunn, PA-C   81 mg at 09/03/13 1054  . baclofen (LIORESAL) tablet 10 mg  10 mg Oral TID Dayna N Dunn, PA-C   10 mg at 09/03/13 1054  . buPROPion (WELLBUTRIN XL) 24 hr tablet 150 mg  150 mg Oral Daily Dayna N Dunn, PA-C   150 mg at 09/03/13 1054  . Canagliflozin TABS 300 mg  300 mg Oral Daily Dayna N Dunn, PA-C   300 mg at 09/03/13 1054  . cholecalciferol (VITAMIN D) tablet 1,000 Units  1,000 Units Oral Daily Dayna N Dunn, PA-C   1,000 Units at 09/03/13 1055  . colchicine tablet 0.6 mg  0.6 mg Oral BID Dayna N Dunn, PA-C   0.6 mg at 09/03/13 1055  . cyclobenzaprine (FLEXERIL) tablet 10 mg  10 mg Oral TID PRN Dayna N Dunn, PA-C      . gabapentin (NEURONTIN) capsule 600 mg  600 mg Oral BID Josue Hector, MD   600 mg at 09/03/13 0827  . gabapentin (NEURONTIN) capsule 900 mg  900 mg Oral QHS Josue Hector, MD   900 mg at 09/02/13 2201  . glimepiride (AMARYL) tablet 2 mg  2 mg Oral BID WC Dayna N Dunn, PA-C   2 mg at 09/03/13 3220  . ibuprofen (ADVIL,MOTRIN) tablet 800 mg  800 mg Oral TID Dayna N Dunn, PA-C   800 mg at 09/03/13 1055  . insulin aspart (novoLOG) injection 0-9 Units  0-9 Units  Subcutaneous TID WC Dayna N Dunn, PA-C   2 Units at 09/03/13 2229  . isosorbide mononitrate (IMDUR) 24 hr tablet 60 mg  60 mg Oral Daily Dayna N Dunn, PA-C   60 mg at 09/03/13 1055  . linagliptin (TRADJENTA) tablet 5 mg  5 mg Oral Daily Dayna N Dunn, PA-C   5 mg at 09/03/13 1055  . metoprolol succinate (TOPROL-XL) 24 hr tablet 100 mg  100 mg Oral Daily Dayna N Dunn, PA-C   100 mg at 09/03/13 1056  . nitroGLYCERIN (NITROSTAT) SL tablet 0.4 mg  0.4 mg Sublingual Q5 Min x 3 PRN Dayna N Dunn, PA-C      . pantoprazole (PROTONIX) EC tablet 40 mg  40 mg Oral Daily Dayna N Dunn, PA-C   40 mg at 09/03/13 1056  . ranolazine (RANEXA) 12 hr tablet 500 mg  500 mg Oral BID Dayna N Dunn, PA-C   500 mg at 09/03/13 1056  . sodium chloride 0.9 % injection 3 mL  3 mL Intravenous Q12H  Dayna N Dunn, PA-C   3 mL at 09/03/13 1057  . sodium chloride 0.9 % injection 3 mL  3 mL Intravenous PRN Dayna N Dunn, PA-C      . Ticagrelor (BRILINTA) tablet 90 mg  90 mg Oral BID Dayna N Dunn, PA-C   90 mg at 09/03/13 1056  . vitamin C (ASCORBIC ACID) tablet 1,000 mg  1,000 mg Oral Daily Dayna N Dunn, PA-C   1,000 mg at 09/03/13 1056    PE: General appearance: alert, cooperative and no distress Neck: no JVD Lungs: clear to auscultation bilaterally Heart: regular rate and rhythm, S1, S2 normal, no murmur, click, rub or gallop Extremities: no LEE Pulses: 2+ and symmetric Skin: warm and dry Neurologic: Grossly normal  Lab Results:   Recent Labs  09/01/13 1213 09/02/13 0415 09/03/13 0332  WBC 5.4 4.4 4.4  HGB 13.4 12.4* 11.5*  HCT 37.2* 34.9* 32.7*  PLT 215 184 159   BMET  Recent Labs  09/01/13 1213 09/02/13 0415 09/03/13 0332  NA 138 139 143  K 4.1 4.3 3.7  CL 99 102 104  CO2 22 19 23   GLUCOSE 281* 154* 180*  BUN 25* 28* 25*  CREATININE 1.10 1.17 1.33  CALCIUM 10.2 9.5 9.1   Cardiac Panel (last 3 results)  Recent Labs  09/01/13 2044 09/02/13 0415 09/02/13 0932  TROPONINI <0.30 <0.30 <0.30    Studies/Results: Study Conclusions  - Left ventricle: The cavity size was mildly dilated. Wall thickness was increased in a pattern of mild LVH. Systolic function was normal. The estimated ejection fraction was 50%, in the range of 50% to 55%. There is hypokinesis of the basal-midinferior myocardium. Doppler parameters are consistent with abnormal left ventricular relaxation (grade 1 diastolic dysfunction). - Pericardium, extracardiac: A small pericardial effusion was identified. Impressions:  - Compared to 06/27/13, LV function appears to be better and pericardial effusion unchanged.   Assessment/Plan  Active Problems:   Chest pain  Plan: I'm not convinced that his symptoms are due to pericarditis. His pain is not pleuritic and not positional. He is w/o  a rub on physical exam. The patient has had pericarditis before in the past and remebers distinctively  the characteristics of the pain. He is not convinced himself that this is pericarditis. A f/u 2D echo yesterday demonstrated only a small pericardial effusion, that was basically unchanged compared to prior exam on 06/27/13. He was seen by Dr. Roxan Hockey yesterday for potential pericardial  window. After examination and review of his echo findings, he did not feel that surgical intervention was indicated. He suggested continuing medical therapy with NSAIDs and colchicine and to try prednisone if symptoms failed to improve. However, the patient is hesitant to go on prednisone due to DM and problems with difficult to control glucose levels in the past with prednisone.  I personally feel that his symptoms are perhaps related to potential coronary spasms. He describes pressure like discomfort that often radiates to the left upper extremity. Also often accompanied by dyspnea of diaphoresis. He states his pain is always instantly relieved after SL NTG. He has had 2 re-look caths since undergoing PCI to the RCA in Jan. Each time, he was noted to have a widely patent RCA stent. He was noted to have a normal LAD, however the proximal segment was noted to dip a bit intramyocardially, but there was no evidence of systolic bridging, at time of cath. I recommend titration of his antianginals.  I feel that he may potentially benefit from addition of amlodipine. However, his BP is a bit soft and this may also interfere with further titration of his Imdur and Lopressor. Due to limitations with his BP, I suggest increasing his Ranexa to 1000 mg BID. Continue 60 mg of Imdur daily and 100 mg of Toprol daily, along with ASA, Brilinta and statin.   MD to assess and will provide further recommendation. If MD agrees to the above plan, we may be able to d/c home later this afternoon with close OP f/u with Dr. Claiborne Billings.    LOS: 2 days     Brittainy M. Ladoris Gene 09/03/2013 12:10 PM  I saw examined patient this afternoon along with Ellen Henri, PA-C.  I also spent time this morning talking about the patient with Dr. Claiborne Billings. We discussed the plan of action.  I agree with Brittany's findings, examination, and recommendations.  The very difficult situation with exertional chest pain, and possible pericarditis. He has had cardiac Is a 2 we assess stent patency Following a stress test, during which unfortunately the stress images were not obtained, but he had significant drop in blood pressure.  Cardiac risk surgery had seen the patient and did not feel that the effusion was significant enough to require a pericardial window.  We're currently not sure what his symptoms are related to. It could be microvascular disease and therefore he is on Ranexa and nitrates.  The other possibility would be pericarditis. As he had been on colchicine as, Dr. Claiborne Billings would like to give a trial of steroids. In the past he has had problems with glycemic control while on steroids, and therefore we'll watch him at least for another night to and see how we will need to adjust his diabetic coverage.\ Another potential option is constrictive pericarditis last if he can-constrictive pericarditis this could potentially explain the drop in blood pressure with ambulation and discomfort in his chest. If his symptoms do not improve with treatment of possible pericarditis, Dr. Claiborne Billings would like to proceed with a right left heart catheterization on Friday to assess for the very possibility of constrictive physiology with ventricular inter-dependence.  If his glycemic levels remained stable, we could consider possible discharge tomorrow with plan to return for outpatient right and left heart catheterization without angiography on Friday.  Leonie Man, M.D., M.S. Interventional Cardiologist  [place] Pager #  [phone] 09/03/2013

## 2013-09-04 ENCOUNTER — Ambulatory Visit: Payer: 59 | Admitting: Cardiovascular Disease

## 2013-09-04 DIAGNOSIS — I4891 Unspecified atrial fibrillation: Secondary | ICD-10-CM

## 2013-09-04 DIAGNOSIS — R079 Chest pain, unspecified: Secondary | ICD-10-CM | POA: Diagnosis present

## 2013-09-04 DIAGNOSIS — I309 Acute pericarditis, unspecified: Secondary | ICD-10-CM | POA: Diagnosis present

## 2013-09-04 DIAGNOSIS — E785 Hyperlipidemia, unspecified: Secondary | ICD-10-CM

## 2013-09-04 LAB — GLUCOSE, CAPILLARY
Glucose-Capillary: 157 mg/dL — ABNORMAL HIGH (ref 70–99)
Glucose-Capillary: 296 mg/dL — ABNORMAL HIGH (ref 70–99)
Glucose-Capillary: 219 mg/dL — ABNORMAL HIGH (ref 70–99)

## 2013-09-04 MED ORDER — INSULIN ASPART 100 UNIT/ML ~~LOC~~ SOLN: 0.0000 [IU] | Freq: Three times a day (TID) | SUBCUTANEOUS | Status: DC

## 2013-09-04 MED ORDER — RANOLAZINE ER 500 MG PO TB12
1000.0000 mg | ORAL_TABLET | Freq: Two times a day (BID) | ORAL | Status: DC
  Filled 2013-09-04: qty 2

## 2013-09-04 MED ORDER — PREDNISONE 20 MG PO TABS
20.0000 mg | ORAL_TABLET | Freq: Once | ORAL | Status: AC
  Administered 2013-09-04: 20 mg via ORAL
  Filled 2013-09-04: qty 1

## 2013-09-04 MED ORDER — RANOLAZINE ER 500 MG PO TB12
500.0000 mg | ORAL_TABLET | Freq: Once | ORAL | Status: AC
  Filled 2013-09-04: qty 1

## 2013-09-04 MED ORDER — PREDNISONE 10 MG PO TABS: ORAL_TABLET | ORAL | Status: DC

## 2013-09-04 MED ORDER — RANOLAZINE ER 500 MG PO TB12
1000.0000 mg | ORAL_TABLET | Freq: Two times a day (BID) | ORAL | Status: DC
  Administered 2013-09-04: 1000 mg via ORAL
  Filled 2013-09-04: qty 2

## 2013-09-04 MED ORDER — PREDNISONE 50 MG PO TABS: ORAL_TABLET | ORAL | Status: DC

## 2013-09-04 MED ORDER — "INSULIN SYRINGE-NEEDLE U-100 27G X 1/2"" 1 ML MISC": 1.0000 | Freq: Three times a day (TID) | Status: DC

## 2013-09-04 MED ORDER — COLCHICINE 0.6 MG PO TABS
0.3000 mg | ORAL_TABLET | Freq: Two times a day (BID) | ORAL | Status: DC
Start: 2013-09-04 — End: 2013-09-29

## 2013-09-04 MED ORDER — RANOLAZINE ER 1000 MG PO TB12: 1000.0000 mg | ORAL_TABLET | Freq: Two times a day (BID) | ORAL | Status: DC

## 2013-09-04 NOTE — Progress Notes (Signed)
Inpatient Diabetes Program Recommendations  AACE/ADA: New Consensus Statement on Inpatient Glycemic Control (2013)  Target Ranges:  Prepandial:   less than 140 mg/dL      Peak postprandial:   less than 180 mg/dL (1-2 hours)      Critically ill patients:  140 - 180 mg/dL   Reason for Visit: Referral for "SSI" recommendations for pt for d/c on Prednisone titer dose. 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 Pt presently on Prednsone 20 mg, however pt states he will be discharge on Prednisone 50 mg/day (Spoke with PA regarding this issue. She states pt will be discharged on 50 units/day) In any case this pt would most benefit from a correction scale before each meal.  ( I would include meal coverage as well, however pt will be titrated down q 6-8 days.) Pt has been on sensitive correction tidwc while here and glucose rose significantly following breakfast today after only 20 mg Prednisone.. Recommend for discharge while on prednisone greater than 10 mg/day to use the moderate scale tidwc (begins with 2 units at 121 mg/dL)and the HS scale (2 units at 201 mg/dL) that is used with our glycemic control order set  In addition, add 3 units novolog meal coverage until the correction dose itself is not needed.  Can instruct pt to increase meal coverage if still requiring high doses of correction and/or decrease if having hypoglycemia below 70 mg/dL. Once the Prednisone is down to 10 mg per day, pt may not need any insulin. Pt will need the scale to be written/printed out for clarity.  I will assist PA with this.  Will explain to patient and wife how to use and adjust as needed Pt can use an insulin pen; will instruct on how to use.  Thank you, Rosita Kea, RN, CNS, Diabetes Coordinator [phone])

## 2013-09-04 NOTE — Progress Notes (Signed)
Came to visit patient at bedside to offer [facility] program for DM management. Patient works at [employer]. He has been followed in the past with the [place] to Wellness program but is no longer active. He is interested in being followed with the office in [place]. Left packet at bedside and contact information. Made him aware that he will receive a post hospital discharge call and will receive a call from [facility] staff to make an appointment for follow up. Confirmed best contact information and consents were obtained. Appreciative of visit.  Marthenia Rolling, MSN- [facility] [phone]

## 2013-09-04 NOTE — Discharge Instructions (Signed)
Acute Coronary Syndrome Acute coronary syndrome (ACS) is an urgent problem in which the blood and oxygen supply to the heart is critically deficient. ACS requires hospitalization because one or more coronary arteries may be blocked. ACS represents a range of conditions including:  Previous angina that is now unstable, lasts longer, happens at rest, or is more intense.  A heart attack, with heart muscle cell injury and death. There are three vital coronary arteries that supply the heart muscle with blood and oxygen so that it can pump blood effectively. If blockages to these arteries develop, blood flow to the heart muscle is reduced. If the heart does not get enough blood, angina may occur as the first warning sign. SYMPTOMS   The most common signs of angina include:  Tightness or squeezing in the chest.  Feeling of heaviness on the chest.  Discomfort in the arms, neck, or jaw.  Shortness of breath and nausea.  Cold, wet skin.  Angina is usually brought on by physical effort or excitement which increase the oxygen needs of the heart. These states increase the blood flow needs of the heart beyond what can be delivered. TREATMENT   Medicines to help discomfort may include nitroglycerin (nitro) in the form of tablets or a spray for rapid relief, or longer-acting forms such as cream, patches, or capsules. (Be aware that there are many side effects and possible interactions with other drugs).  Other medicines may be used to help the heart pump better.  Procedures to open blocked arteries including angioplasty or stent placement to keep the arteries open.  Open heart surgery may be needed when there are many blockages or they are in critical locations that are best treated with surgery. HOME CARE INSTRUCTIONS   Avoid smoking.  Take one baby or adult aspirin daily, if your caregiver advises. This helps reduce the risk of a heart attack.  It is very important that you follow the angina  treatment prescribed by your caregiver. Make arrangements for proper follow-up care.  Eat a heart healthy diet with salt and fat restrictions as advised.  Regular exercise is good for you as long as it does not cause discomfort. Do not begin any new type of exercise until you check with your caregiver.  If you are overweight, you should lose weight.  Try to maintain normal blood lipid levels.  Keep your blood pressure under control as recommended by your caregiver.  You should tell your caregiver right away about any increase in the severity or frequency of your chest discomfort or angina attacks. When you have angina, you should stop what you are doing and sit down. This may bring relief in 3 to 5 minutes. If your caregiver has prescribed nitro, take it as directed.  If your caregiver has given you a follow-up appointment, it is very important to keep that appointment. Not keeping the appointment could result in a chronic or permanent injury, pain, and disability. If there is any problem keeping the appointment, you must call back to this facility for assistance. SEEK IMMEDIATE MEDICAL CARE IF:   You develop nausea, vomiting, or shortness of breath.  You feel faint, lightheaded, or pass out.  Your chest discomfort gets worse.  You are sweating or experience sudden profound fatigue.  You do not get relief of your chest pain after 3 doses of nitro.  Your discomfort lasts longer than 15 minutes. MAKE SURE YOU:   Understand these instructions.  Will watch your condition.  Will get help  right away if you are not doing well or get worse. Document Released: 06/05/2005 Document Revised: 08/28/2011 Document Reviewed: 01/07/2008 [facility] Patient Information 2014 [place].  Angina Pectoris Angina pectoris is extreme discomfort in your chest, neck, or arm. Your doctor may call it just angina. It is caused by a lack of oxygen to your heart wall. It may feel like tightness or heavy  pressure. It may feel like a crushing or squeezing pain. Some people say it feels like gas. It may go down your shoulders, back, and arms. Some people have symptoms other than pain. These include:  Tiredness.  Shortness of breath.  Cold sweats.  Feeling sick to your stomach (nausea). There are four types of angina:  Stable angina often lasts the same amount of time each time it happens. Activity, stress, or excitement can bring it on. It often gets better after taking a special medicine called nitroglycerin. This goes under your tongue.  Unstable angina can happen when you are not active or even during sleep. It can suddenly get worse or happen more often. It may not get better after taking the special medicine. It can last up to 30 minutes.  Microvascular angina is more common in women. It may be more severe or last longer than other types.  Prinzmetal angina often happens when you are not active or in the early morning hours. HOME CARE   Only take medicines as told by your doctor.  Stay active or exercise more as told by your doctor.  Limit very hard activity as told by your doctor.  Limit heavy lifting as told by your doctor.  Keep a healthy weight.  Learn about and eat foods that are healthy for your heart.  Do not smoke. GET HELP RIGHT AWAY IF:   You have chest, neck, deep shoulder, or arm pain or discomfort that lasts more than a few minutes.  You have chest, neck, deep shoulder, or arm pain or discomfort that goes away and comes back over and over again.  You have heavy sweating that seems to happen for no reason.  You have shortness of breath or trouble breathing.  Your angina does not get better after a few minutes of rest.  Your angina does not get better after you take nitroglycerin medicine. These can all be symptoms of a heart attack. Get help right away. Call your local emergency service (911 in [place] Do not  drive yourself to the hospital. Do not  wait to  for your symptoms to go away. MAKE SURE YOU:   Understand these instructions.  Will watch your condition.  Will get help right away if you are not doing well or get worse. Document Released: 11/22/2007 Document Revised: 05/22/2012 Document Reviewed: 03/14/2012 [facility] Patient Information 2014 [place], [place].  Insulin Treatment for Diabetes Diabetes is a disease that does not go away (chronic). It occurs when the body does not properly use the sugar (glucose) that is released from food after it is digested. Glucose levels are controlled by a hormone called insulin, which is made by your pancreas. Depending on the type of diabetes you have, either of the following will apply:   The pancreas does not make any insulin (type 1 diabetes).  The pancreas makes too little insulin, and the body cannot respond normally to the insulin that is made (type 2 diabetes). Without insulin, death can occur. However, with the addition of insulin, blood sugar monitoring, and treatment, someone with diabetes can live a full and  productive life. This document will discuss the role of insulin in your treatment and provide information about its use.  HOW IS INSULIN GIVEN? Insulin is a medicine that can only be given by injection. Taking it by mouth makes it inactive because of the acid in your stomach. Insulin is injected under the skin by a syringe and needle, an insulin pen, a pump, or a jet injector. Your dose will be determined by your health care provider based on your individual needs. You will also be given guidance on which method of giving insulin is right for you. Remember that if you give insulin with a needle and syringe, you must do so using only a special insulin syringe made for this purpose. WHERE ON THE BODY SHOULD INSULIN BE INJECTED? Insulin is injected into the fatty layer of tissue just under your skin. Good places to inject insulin include the upper arm, the front and outer area of the thigh, the  hips, and the abdomen. Giving your insulin in the abdomen is preferred because this provides the most rapid and consistent absorption. Avoid the area 2 inches (5 cm) around the navel and avoid injecting into areas on your body with scar tissue. In addition, it is important to rotate your injection sites with every shot to prevent irritation and improve absorption. WHAT ARE THE DIFFERENT TYPES OF INSULIN?  If you have type 1 diabetes, you must take insulin to stay alive. Your body does not produce it. If you have type 2 diabetes, you might require insulin in addition to, or instead of, other medicines. In either case, proper use of insulin is critical to control your diabetes.  There are a number of different types of insulin. Usually, you will give yourself injections, though others can be trained to give them to you. Some people have an insulin pump that delivers insulin continuously through a tube (cannula) that is placed under the skin. Using insulin requires that you check your blood sugar several times a day. The exact number of times and time of day to check will vary depending on your type of diabetes, your type of insulin, and treatment goals. Your health care provider will direct you.  Generally, different insulins have different properties. The following is a general guide. Specifics will vary by product, and new products are introduced periodically.   Rapid-acting insulin starts working quickly (in as little as 5 minutes) and wears off in 4 to 6 hours (sometimes longer). This type of insulin works well when taken just before a meal to bring your blood sugar quickly back to normal.   Short-acting insulin starts working in about 30 minutes and can last 6 to 10 hours. This type of insulin should be taken about 30 minutes before you start eating a meal.  Intermediate-acting insulin starts working in 1-2 hours and wears off after about 10 to 18 hours. This insulin will lower your blood sugar for a  longer period of time, but it will not be as effective in lowering your blood sugar right after a meal.   Long-acting insulin mimics the small amount of insulin that your pancreas usually produces throughout the day. You need to have some insulin present at all times. It is crucial to the metabolism of brain cells and other cells. Long-acting insulin is meant to be used either once or twice a day. It is usually used in combination with other types of insulin, or in combination with other diabetes medicines.  Discuss the type of  insulin you are taking with your health care provider or pharmacist. You will then be aware of when the insulin can be expected to peak and when it will wear off. This is important to know so you can plan for meal times and periods of exercise.  Your health care provider will usually have a strategy in mind when treating you with insulin. This will vary with your type of diabetes, your diabetes treatment goals, and your health history. It is important that you understand this strategy so you can be an active partner in treating your diabetes. Here are some terms you might hear:   Basal insulin. This refers to the small amount of insulin that needs to be present in your blood at all times. Sometimes oral medicines will be enough. For other people, and especially for people with type 1 diabetes, insulin is needed. Usually, intermediate-acting or long-acting insulin is used once or twice a day to accomplish this.   Prandial (meal-related) insulin. Your blood sugar will rise rapidly after a meal. Rapid-acting or short-acting insulin can be used right before the meal to bring your blood sugar back to normal quickly. You might be instructed to adjust the amount of insulin depending on how much carbohydrate (starch) is in your meal.   Corrective insulin. You might be instructed to check your blood sugar at certain times of the day. You then might use a small amount of rapid-acting or  short-acting insulin to bring the blood sugar down to normal if it is elevated.   Tight control (also called intensive therapy). Tight control means keeping your blood sugar as close to your target as possible and keeping it from going too high after meals. People with tight control of their diabetes are shown to have fewer long-term complications from their diabetes.   Glycohemoglobin (also called glyco, glycosylated hemoglobin, hemoglobin A1c, or A1c) level. This measures how well your blood sugar has been controlled during the past 1 to 3 months. It helps your health care provider see how effective your treatment is and decide if any changes are needed. Your health care provider will discuss your target glycohemoglobin level with you.  Insulin treatment requires your careful attention. Treatment plans will be different for different people. Some people do well with a simple program. Others require more complicated programs, with multiple insulin injections daily. You will work with your health care provider to develop the best program for you. Regardless of your insulin treatment plan, you must also do your best on weight control, diet and food choices, exercise, blood pressure control, cholesterol control, and stress levels.  WHAT ARE THE SIDE EFFECTS OF INSULIN? Although insulin treatment is important, it does have some side effects, such as:   Insulin can cause your blood sugar to go too low (hypoglycemia).   Weight gain can occur.   Improper injection technique can cause hypoglycemia, blood sugar going too high (hyperglycemia), skin injury or irritation, or other problems. You must learn to inject insulin properly. Document Released: 09/01/2008 Document Revised: 02/05/2013 Document Reviewed: 11/17/2012 [facility] Patient Information 2014 [place], [place].  Pericarditis Pericarditis is swelling (inflammation) of the pericardium. The pericardium is a thin, double-layered, fluid-filled tissue  sac that surrounds the heart. The purpose of the pericardium is to contain the heart in the chest cavity and keep the heart from overexpanding. Different types of pericarditis can occur, such as:  Acute pericarditis. Inflammation can develop suddenly in acute pericarditis.  Chronic pericarditis. Inflammation develops gradually and is long-lasting  in chronic pericarditis.  Constrictive pericarditis. In this type of pericarditis, the layers of the pericardium stiffen and develop scar tissue. The scar tissue thickens and sticks together. This makes it difficult for the heart to pump and work as it normally does. CAUSES  Pericarditis can be caused from different conditions, such as:  A bacterial, fungal or viral infection.  After a heart attack (myocardial infarction).  After open-heart surgery (coronary bypass graft surgery).  Auto-immune conditions such as lupus, rheumatoid arthritis or scleroderma.  Kidney failure.  Low thyroid condition (hypothyroidism).  Cancer from another part of the body that has spread (metastasized) to the pericardium.  Chest injury or trauma.  After radiation treatment.  Certain medicines. SYMPTOMS  Symptoms of pericarditis can include:  Chest pain. Chest pain symptoms may increase when laying down and may be relieved when sitting up and leaning forward.  A chronic, dry cough.  Heart palpitations. These may feel like rapid, fluttering or pounding heart beats.  Chest pain may be worse when swallowing.  Dizziness or fainting.  Tiredness, fatigue or lethargy.  Fever. DIAGNOSIS  Pericarditis is diagnosed by the following:  A physical exam. A heart sound called a pericardial friction rub may be heard when your caregiver listens to your heart.  Blood work. Blood may be drawn to check for an infection and to look at your blood chemistry.  Electrocardiography. During electrocardiography your heart's electrical activity is monitored and recorded with  a tracing on paper (electrocardiogram [ECG]).  Echocardiography.  Computed tomography (CT).  Magnetic resonance image (MRI). TREATMENT  To treat pericarditis, it is important to know the cause of it. The cause of pericarditis determines the treatment.   If the cause of pericarditis is due to an infection, treatment is based on the type of infection. If an infection is suspected in the pericardial fluid, a procedure called a pericardial fluid culture and biopsy may be done. This takes a sample of the pericardial fluid. The sample is sent to a lab which runs tests on the pericardial fluid to check for an infection.  If the autoimmune disease is the cause, treatment of the autoimmune condition will help improve the pericarditis.  If the cause of pericarditis is not known, anti-inflammatory medicines may be used to help decrease the inflammation.  Surgery may be needed. The following are types of surgeries or procedures that may be done to treat pericarditis:  Pericardial window. A pericardial window makes a cut (incision) into the pericardial sac. This allows excess fluid in the pericardium to drain.  Pericardiocentesis. A pericardiocentesis is also known as a pericardial tap. This procedure uses a needle that is guided by X-ray to drain (aspirate) excess fluid from the pericardium.  Pericardiectomy. A pericardiectomy removes part or all of the pericardium. HOME CARE INSTRUCTIONS   Do not smoke. If you smoke, quit. Your caregiver can help you quit smoking.  Maintain a healthy weight.  Follow an exercise program as told by your caregiver.  If you drink alcohol, do so in moderation.  Eat a heart healthy diet. A registered dietician can help you learn about healthy food choices.  Keep a list of all your medicines with you at all times. Include the name, dose, how often it is taken and how it is taken. SEEK IMMEDIATE MEDICAL CARE IF:   You have chest pain or feelings of chest  pressure.  You have sweating (diaphoresis) when at rest.  You have irregular heartbeats (palpitations).  You have rapid, racing heart beats.  You have unexplained fainting episodes.  You feel sick to your stomach (nausea) or vomiting without cause.  You have unexplained weakness. If you develop any of the symptoms which originally made you seek care, call for local emergency medical help. Do not drive yourself to the hospital. Document Released: 11/29/2000 Document Revised: 08/28/2011 Document Reviewed: 06/07/2011 [facility] Patient Information 2014 [place], [place].

## 2013-09-04 NOTE — Progress Notes (Addendum)
For Discharge Correction Scale orders for Prednisone any dose greater than 10 mg:-request per Kristopher Ferries, PA:  Novolog insulin Moderate scale from Glycemic control order set:  CBG    Units Novolog three times per day before each meal:  70-120- 0 unit 121-150 2 units 151-200 3 units 201-250 5 units 251-300 8 units 301-350  11 units 351-400 15 units >400- call MD  For  Bedtime correction: (at least 4 hrs following supper time insulin) 70- 200 0 units 201-250 2 units 251-300 3 units 301-350 4 units 351-400 5 units. 201-  If meal correction is not enough to control glucose less than or equal to 150, add 3 units meal coverage with correction. Thank you, Kristopher Kea, RN, CNS, Diabetes Coordinator [phone])

## 2013-09-04 NOTE — Progress Notes (Signed)
Inpatient Diabetes Program Recommendations  AACE/ADA: New Consensus Statement on Inpatient Glycemic Control (2013)  Target Ranges:  Prepandial:   less than 140 mg/dL      Peak postprandial:   less than 180 mg/dL (1-2 hours)      Critically ill patients:  140 - 180 mg/dL   Pt orderd OP education . Pt is employee for [employer] and can be managed and educated per the system's Link To Wellness program for employees. Will order the OP education who will contact pt for appt information. Thank you, Rosita Kea, RN, CNS, Diabetes Coordinator [phone])

## 2013-09-04 NOTE — Progress Notes (Addendum)
Patient Profile: 63 y/o [name] followed by [name], w/ h/o DM and known CAD, s/p ACS in Jan 2015 and underwent successful DES to RCA. He has undergone 2 subsequent caths since with patent stent. He also has a chronic pericardial effusion since suffering an episode of pericarditis ~ 8 yrs ago.  He was recently placed on NSAIDs and colchicine, for presumptive pericarditis without benefit. He was re-admitted 3/16 for evaluation of recurrent chest pain and dyspnea. A CT of the chest on 3/16 suggested the pericardial effusion was now moderate and had increased in size.  A repeat 2D echo yesterday demonstrated only a small pericardial effusion with little change, compared to prior study on 06/27/13.   He was seen by Dr. Roxan Hockey, [place] surgeon, for evaluation of potential pericardial window. After examination and review of his echo findings, he was not convinced that his pain is due to pericarditis and feels that  The amount of pericardial effusion is not significant enough for surgical intervention.   Subjective: No Sx o/n.  Unfortunately did not get Steroid dose yesterday.  Objective: Vital signs in last 24 hours: Temp:  [97.7 F (36.5 C)-97.8 F (36.6 C)] 97.8 F (36.6 C) (03/19 2919) Pulse Rate:  [81-100] 81 (03/19 0614) Resp:  [18] 18 (03/19 0614) BP: (104-112)/(62-68) 105/68 mmHg (03/19 0614) SpO2:  [92 %-97 %] 94 % (03/19 0614) Last BM Date: 09/02/13  Intake/Output from previous day:   Intake/Output this shift:    Medications: Reviewed in Epic  PE: General appearance: alert, cooperative and no distress Neck: no JVD Lungs: clear to auscultation bilaterally; non-labored Heart: RRR, S1, S2 normal, no murmur, click, rub or gallop Extremities: no LEE Pulses: 2+ and symmetric Skin: warm and dry Neurologic: Grossly normal  Lab Results:   Recent Labs  09/01/13 1213 09/02/13 0415 09/03/13 0332  WBC 5.4 4.4 4.4  HGB 13.4 12.4* 11.5*  HCT 37.2* 34.9* 32.7*  PLT 215 184 159    BMET  Recent Labs  09/01/13 1213 09/02/13 0415 09/03/13 0332  NA 138 139 143  K 4.1 4.3 3.7  CL 99 102 104  CO2 22 19 23   GLUCOSE 281* 154* 180*  BUN 25* 28* 25*  CREATININE 1.10 1.17 1.33  CALCIUM 10.2 9.5 9.1   Cardiac Panel (last 3 results)  Recent Labs  09/01/13 2044 09/02/13 0415 09/02/13 0932  TROPONINI <0.30 <0.30 <0.30    Studies/Results: Echo: EF 50-55%, basal Inf HK.  Small pericardial effusion (unchanged)  Assessment/Plan  Principal Problem:   Chest pain with moderate risk for cardiac etiology - uncertain etiology Active Problems:   Non-ST elevation myocardial infarction (NSTEMI), subendocardial infarction, subsequent episode of care   Pericardial effusion   Acute pericarditis, unspecified - possible   Exertional chest pain   Diabetes mellitus, type 2   Plan:   Exertional CP -- unsure if this is angina (no macrovascular based upon repeat cath) --? Microvascular  On DAPT for recent stent  Full dose BB  Increased Ranexa to 1000 mg bid + Imdur 60 mg  No additional BP room for CCB or ACE-I  ? Pericarditis -- Steroid taper x 2 wks. -- will need assistance with glycemic control  DM Counseling SVC consult to help plan Corrective action plan (? OP SSI) to combate steroid induced hyperglycemia. PT would be willing to do short term Sliding Scale Insulin)  If Sx not relieved or persist =- consider possibility of chronic pericarditis (effusive constrictive) leading to hypotension during ST --> stay hydrated  If Sx remain - would consider R&LHC to exclude Constrictive physiology with ventricular interdependence   DISPO: If able to ambulate & shower today w/o Sx & able to establish plan for OP management of hyperglycemia on steroids, anticipate d/c by mid Afternoon.   Leonie Man, M.D., M.S. Interventional Cardiologist  [place] Pager # [phone] 09/04/2013

## 2013-09-04 NOTE — Progress Notes (Signed)
Reviewed discharge instructions with patient and he stated his understanding.  Patient states he is fine with administering insulin at home to himself.  Awaiting wife to arrive for transport home.  Kristopher [name]

## 2013-09-04 NOTE — Discharge Summary (Signed)
CARDIOLOGY DISCHARGE SUMMARY   Patient ID: DAEVION [name] MRN: [id number] DOB/AGE: 61/05/1951 61 y.o.  Admit date: 09/01/2013 Discharge date: 09/04/2013  PCP: Allyne Gee, MD Primary Cardiologist: TK  Primary Discharge Diagnosis:   Chest pain with moderate risk for cardiac etiology - etiology likely pericarditis  Secondary Discharge Diagnosis:    Non-ST elevation myocardial infarction (NSTEMI), subendocardial infarction, subsequent episode of care   Pericardial effusion   Diabetes mellitus, type 2   Acute pericarditis, unspecified - possible   Exertional chest pain  Consults: TCTS, DM coordinator  Procedures: 2-D echocardiogram  Hospital Course: Kristopher [name] is a 63 y.o. male with a history of CAD. He had a drug-eluting stent to the RCA in January 2015. He had a moderate pericardial effusion that was initially seen in 2006, and it was noted again in January. Since his hospitalization in January, he has since continued to have problems with chest pain with exertion. He has had 2 cardiac catheterizations since his MI, both showing patent stent and no other critical disease.   He came to the hospital on 3/16 for worsening chest pain and the etiology of his symptoms was not clear. His chest pain was not pleuritic in nature but there was concern for pericarditis. A sedimentation rate was checked and he was started on steroids. His chest x-ray was abnormal with a possible nodule. Because of this and also the history of pericardial effusion, a chest CT without contrast was performed. Results are below and the possible nodule was determined to be a prominent nipple shadow. His pericardial effusion was felt to have increased in size. An echocardiogram was ordered.  The 2-D echocardiogram showed preserved left ventricular function and his pericardial effusion was unchanged in size. A surgical consult was called to see if Mr. Seidman would benefit from a pericardial window. He was seen by Dr.  Roxan Hockey who recommended continuing treatment with colchicine and NSAIDS with consideration given to steroids if these medicines don't work. His Ranexa was increased to treat possible small vessel disease. However, with the increasing Ranexa, his colchicine needs to be decreased and this was done.  He had been on Lipitor after his MI, but he had abnormal liver function tests and this had been placed on hold. He is to continue to hold this until otherwise directed.  His ESR was minimally abnormal at 23. Mr. Dyar was reluctant to take the steroids because he has had problems in the past with hyperglycemia. He was seen by the diabetes coordinator and recommendations were made for insulin. He will not be on Lantus but will have meal coverage as he is diet compliant and his sugars are generally well-controlled. He was given information on a sliding scale milk coverage and will use this until his sugars are under better control.  On 3/19, he was seen by Dr. Ellyn Hack and all data were reviewed. His symptoms had improved on steroids and no further inpatient workup is recommended. He is considered stable for discharge, to follow up as an outpatient.  Labs:  Lab Results  Component Value Date   WBC 4.4 09/03/2013   HGB 11.5* 09/03/2013   HCT 32.7* 09/03/2013   MCV 96.2 09/03/2013   PLT 159 09/03/2013    Recent Labs Lab 09/01/13 1523  09/03/13 0332  NA  --   < > 143  K  --   < > 3.7  CL  --   < > 104  CO2  --   < >  23  BUN  --   < > 25*  CREATININE  --   < > 1.33  CALCIUM  --   < > 9.1  PROT 6.8  --   --   BILITOT 0.7  --   --   ALKPHOS 79  --   --   ALT 106*  --   --   AST 63*  --   --   GLUCOSE  --   < > 180*  < > = values in this interval not displayed.  Recent Labs  09/01/13 2044 09/02/13 0415 09/02/13 0932  TROPONINI <0.30 <0.30 <0.30   Lipid Panel     Component Value Date/Time   CHOL 175 06/27/2013 0630   TRIG 152* 08/07/2013 0857   TRIG 126 06/27/2013 0630   HDL 31* 06/27/2013 0630    CHOLHDL 5.6 06/27/2013 0630   VLDL 25 06/27/2013 0630   LDLCALC 49 08/07/2013 0857   LDLCALC 119* 06/27/2013 0630   Pro B Natriuretic peptide (BNP)  Date/Time Value Ref Range Status  09/01/2013  3:23 PM 166.2* 0 - 125 pg/mL Final   Lab Results  Component Value Date   ESRSEDRATE 23* 09/01/2013      Radiology: Dg Chest 2 View 09/01/2013   CLINICAL DATA:  Chest pain  EXAM: CHEST  2 VIEW  COMPARISON:  Portable chest x-ray at June 26, 2013.  FINDINGS: The lungs are adequately inflated. There is no focal infiltrate. There is a 7 mm diameter soft tissue density nodule projecting over the posterior aspect of the right ninth rib on the frontal film. This may reflect a mildly engorged pulmonary vein on end, but it is more conspicuous than on the previous study.  The cardiac silhouette has decreased in size since the previous study consistent with a decrease in the known pericardial effusion. The pulmonary vascularity is prominent centrally. The mediastinum is normal in width. The descending thoracic aorta demonstrates mild tortuosity. There is no pleural effusion or pneumothorax. The observed portions of the bony thorax exhibit no acute abnormalities. There are stable calcifications in the right axilla.  IMPRESSION: 1. There is no evidence of pulmonary edema or pneumonia. 2. There is a 7 mm diameter soft tissue density nodule projecting over the posterior aspect of the right ninth rib on the frontal view only. This may reflect an engorged pulmonary vessel but a true nodule is not excluded though none was demonstrated here in the past either on plain film or on the CT scan of June 26, 2013. A followup PA and lateral chest x-ray in 3-4 weeks is recommended. Repeat chest CT scanning may be ultimately needed.   Electronically Signed   By: Traycen Goyer  [place]   On: 09/01/2013 12:45   Ct Chest Wo Contrast 09/01/2013   CLINICAL DATA:  Chest pain.  History of pericardial effusion.  EXAM: CT CHEST WITHOUT CONTRAST  TECHNIQUE:  Multidetector CT imaging of the chest was performed following the standard protocol without IV contrast.  COMPARISON:  DG CHEST 2 VIEW dated 09/01/2013; CT CHEST W/CM dated 09/21/2004  FINDINGS: Thoracic aorta is normal in caliber. Atherosclerotic vascular changes are noted. Heart size is normal. Moderate pericardial effusion remains. Pericardial effusion has increased slightly in size from prior study.  Shotty mediastinal lymph nodes. No pathologic adenopathy noted using CT size criteria. Thoracic esophagus is normal.  Large airways are patent. Calcified right upper lobe pulmonary nodule consistent granulomas noted. Previously identified nodular density projected over the right lung base represents nipple shadow.  No pulmonary nodule noted. Minimal basilar atelectasis. Apical pleural parenchymal thickening consistent with scarring.  Adrenals normal. Visualized upper abdominal visceral unremarkable. Splenosis. Shotty retroperitoneal lymph nodes present.  Thyroid is normal. No supraclavicular or axillary lymph nodes are noted. Chest wall is intact. Degenerative changes thoracic spine.  IMPRESSION: 1. Persistent pericardial effusion. Pericardial effusion is moderate in size and has increased in size from prior study . 2. Previously identified nodular density projected over right lung base represents prominent nipple shadow. No significant pulmonary disease noted.   Electronically Signed   By: Marcello Moores  [place]   On: 09/01/2013 18:56   EKG:  01-Sep-2013 11:50:56  Sinus tachycardia Probable left atrial enlargement Inferior infarct, age indeterminate Baseline wander in lead(s) II III aVF V6 Vent. rate 100 BPM PR interval 144 ms QRS duration 95 ms QT/QTc 372/480 ms P-R-T axes 59 68 4  Echo:  09/02/2013 Conclusions - Left ventricle: The cavity size was mildly dilated. Wall thickness was increased in a pattern of mild LVH. Systolic function was normal. The estimated ejection fraction was 50%, in the range of  50% to 55%. There is hypokinesis of the basal-midinferior myocardium. Doppler parameters are consistent with abnormal left ventricular relaxation (grade 1 diastolic dysfunction). - Pericardium, extracardiac: A small pericardial effusion was identified. Impressions: - Compared to 06/27/13, LV function appears to be better and pericardial effusion unchanged.  FOLLOW UP PLANS AND APPOINTMENTS Allergies  Allergen Reactions  . Codeine Nausea And Vomiting  . Other Nausea And Vomiting    The patient states that he has had  a reaction to all narcotics. He can take them though.     Medication List    STOP taking these medications       atorvastatin 80 MG tablet  Commonly known as:  LIPITOR      TAKE these medications       aspirin EC 81 MG tablet  Take 81 mg by mouth daily.     baclofen 10 MG tablet  Commonly known as:  LIORESAL  Take 10 mg by mouth 3 (three) times daily.     buPROPion 150 MG 24 hr tablet  Commonly known as:  WELLBUTRIN XL  Take 150 mg by mouth daily.     cholecalciferol 1000 UNITS tablet  Commonly known as:  VITAMIN D  Take 1,000 Units by mouth daily.     colchicine 0.6 MG tablet  Take 0.5 tablets (0.3 mg total) by mouth 2 (two) times daily.     cyclobenzaprine 10 MG tablet  Commonly known as:  FLEXERIL  Take 10 mg by mouth 3 (three) times daily as needed for muscle spasms.     fenofibrate 54 MG tablet  Take 54 mg by mouth at bedtime.     gabapentin 300 MG capsule  Commonly known as:  NEURONTIN  Take 600-900 mg by mouth 3 (three) times daily. Take 691m in the morning, 6080min the afternoon, and 90080mt bedtime.     glimepiride 2 MG tablet  Commonly known as:  AMARYL  Take 2 mg by mouth 2 (two) times daily.     ibuprofen 200 MG tablet  Commonly known as:  ADVIL,MOTRIN  Take 800 mg by mouth every 6 (six) hours as needed (pain).     insulin aspart 100 UNIT/ML injection  Commonly known as:  novoLOG  Inject 0-9 Units into the skin 3 (three)  times daily with meals.     Insulin Syringe-Needle U-100 27G X 1/2" 1 ML Misc  1  Syringe by Does not apply route 3 (three) times daily before meals.     INVOKANA 300 MG Tabs  Generic drug:  Canagliflozin  Take 300 mg by mouth daily.     isosorbide mononitrate 60 MG 24 hr tablet  Commonly known as:  IMDUR  Take 60 mg by mouth daily.     metoprolol succinate 100 MG 24 hr tablet  Commonly known as:  TOPROL-XL  Take 100 mg by mouth daily. Take with or immediately following a meal.     nitroGLYCERIN 0.4 MG SL tablet  Commonly known as:  NITROSTAT  Place 0.4 mg under the tongue every 5 (five) minutes as needed for chest pain.     predniSONE 10 MG tablet  Commonly known as:  DELTASONE  Start with 5 tabs daily and taper down by 1 tablet every 4 days until completed. 5 tabs x 4 days, 4 tabs x 4 days, etc.     ranolazine 1000 MG SR tablet  Commonly known as:  RANEXA  Take 1 tablet (1,000 mg total) by mouth 2 (two) times daily.     sitaGLIPtin-metformin 50-500 MG per tablet  Commonly known as:  JANUMET  Take 1 tablet by mouth 2 (two) times daily with a meal.     Ticagrelor 90 MG Tabs tablet  Commonly known as:  BRILINTA  Take 90 mg by mouth 2 (two) times daily.     VITAMIN B50 COMPLEX PO  Take 1 capsule by mouth daily.     vitamin C 1000 MG tablet  Take 1,000 mg by mouth daily.        Discharge Orders   Future Appointments Provider Department Dept Phone   09/05/2013 9:45 AM Mc[facility] Monitor [place] [phone]   09/08/2013 9:45 AM [facility] Monitor [place] [phone]   09/08/2013 3:00 PM Cecilie Kicks, NP [facility] [phone]   09/10/2013 9:45 AM Mc[facility] Monitor [place] [phone]   09/12/2013 9:45 AM Mc[facility] Monitor [place] [phone]   09/15/2013 9:45 AM Mc[facility] Monitor [place] [phone]   09/17/2013 9:45 AM Mc[facility]Phase[facility] Monitor [place] [phone]   09/19/2013 9:45 AM Mc[facility] Monitor [place] [phone]   09/22/2013 9:45 AM Mc[facility]Phase[facility] Monitor [place] [phone]   09/24/2013 9:45 AM Mc[facility]Phase2 Monitor [place] [phone]   09/26/2013 9:45 AM Mc-Phase2 Monitor [place] [phone]   09/27/2013 8:30 AM N[facility] D[facility] Core Class 1 [facility] [phone]   09/29/2013 9:45 AM Mc[facility]2 Monitor [place] [phone]   10/01/2013 9:45 AM Mc-Phase2 Monitor [place] [phone]   10/03/2013 9:45 AM Mc[facility] Monitor [place] [phone]   10/06/2013 9:45 AM Mc-Phase2 Monitor [place] [phone]   10/08/2013 9:45 AM Mc-Phase2 Monitor [place] [phone]   10/10/2013 9:45 AM Mc-Phase2 Monitor [place] [phone]   10/13/2013 9:45 AM Mc-Phase2 Monitor [place] [phone]   10/15/2013 9:45 AM Mc-Phase2 Monitor [place] [phone]   10/17/2013 9:45 AM Mc-Phase2 Monitor [address] [place]  [place] [phone]   10/20/2013 9:45 AM Mc-Phase2 Monitor [place] [phone]   10/22/2013 9:45 AM Mc-Phase2 Monitor [place] [phone]   10/24/2013 9:45 AM Mc-Phase2 Monitor [place] [phone]   10/27/2013 9:45 AM Mc-Phase2 Monitor [place] [phone]   10/29/2013 9:45 AM Mc-Phase2 Monitor [place]  [facility]ARDIAC [facility] [phone]   10/31/2013 9:45 AM Mc-Phase2 Monitor [place] CARDIAC [facility] [phone]   Future Orders Complete By Expires   Ambulatory referral to Nutrition and Diabetic Education  As directed    Comments:     Pt an employee with [employer] and has insurance with [employer]. MD  Ordered OP education.   Diet - low sodium heart healthy  As directed    Diet Carb Modified  As directed    Increase activity slowly  As directed      Follow-up Information   Follow up with [facility] R, NP On 09/08/2013. (at 3:00 pm)    Specialty:  Cardiology   Contact information:   [address] [place] [address] [phone]       Follow up with [place] On 09/05/2013. (and 03/23 at 09:45 am)    Specialty:  Cardiac Rehabilitation   Contact information:   [address] [id number] [place] [address] [phone]      BRING ALL MEDICATIONS WITH YOU TO FOLLOW UP APPOINTMENTS  Time spent with patient to include physician time: 48 min Signed: Rosaria Ferries, PA-C 09/04/2013, 5:03 PM Co-Sign MD  Gwenlyn Found, gait hospitalization. He was readmitted for similar chest pain that has an ostial cardiac etiology either pericarditis or is a small vessel ischemia there was initial confusion as to what course her intake. I decided to treat a combination of both. He was started on steroids, unfortunately not until today. Diabetes counseling assist with setting of progression for adequate control all on steroid taper. He is also an increased dose of Ranexa plus Imdur. Hopefully these will alleviate his symptoms, however there is still possibility that we would consider right and left heart catheterization to evaluate for ventricular into the interdependence suggesting constrictive physiology.  A TEE was asystematic a statin and the plan was to be discharged today and followed by Dr. Claiborne Billings.  Leonie Man, M.D.,  M.S. Interventional Cardiologist  [place] Pager # [phone] 09/04/2013

## 2013-09-05 ENCOUNTER — Ambulatory Visit (HOSPITAL_COMMUNITY): Payer: 59

## 2013-09-05 ENCOUNTER — Encounter (HOSPITAL_COMMUNITY): Payer: 59

## 2013-09-08 ENCOUNTER — Encounter (HOSPITAL_COMMUNITY): Payer: 59

## 2013-09-08 ENCOUNTER — Telehealth: Payer: Self-pay | Admitting: *Deleted

## 2013-09-08 ENCOUNTER — Ambulatory Visit (INDEPENDENT_AMBULATORY_CARE_PROVIDER_SITE_OTHER): Payer: 59 | Admitting: Cardiology

## 2013-09-08 ENCOUNTER — Ambulatory Visit: Payer: 59 | Admitting: Cardiovascular Disease

## 2013-09-08 ENCOUNTER — Encounter: Payer: Self-pay | Admitting: Cardiology

## 2013-09-08 ENCOUNTER — Ambulatory Visit (HOSPITAL_COMMUNITY): Payer: 59

## 2013-09-08 VITALS — BP 110/72 | HR 95 | Ht 67.0 in | Wt 183.4 lb

## 2013-09-08 DIAGNOSIS — R0683 Snoring: Secondary | ICD-10-CM

## 2013-09-08 DIAGNOSIS — R079 Chest pain, unspecified: Secondary | ICD-10-CM

## 2013-09-08 DIAGNOSIS — I208 Other forms of angina pectoris: Secondary | ICD-10-CM

## 2013-09-08 DIAGNOSIS — I200 Unstable angina: Secondary | ICD-10-CM

## 2013-09-08 DIAGNOSIS — I2510 Atherosclerotic heart disease of native coronary artery without angina pectoris: Secondary | ICD-10-CM

## 2013-09-08 DIAGNOSIS — R0609 Other forms of dyspnea: Secondary | ICD-10-CM

## 2013-09-08 DIAGNOSIS — R0989 Other specified symptoms and signs involving the circulatory and respiratory systems: Secondary | ICD-10-CM

## 2013-09-08 DIAGNOSIS — I309 Acute pericarditis, unspecified: Secondary | ICD-10-CM

## 2013-09-08 MED ORDER — ISOSORBIDE MONONITRATE ER 60 MG PO TB24: 90.0000 mg | ORAL_TABLET | Freq: Every day | ORAL | Status: DC

## 2013-09-08 NOTE — Patient Instructions (Addendum)
Increase imdur ( isosorbide mononitrate) to 90 mg daily  (1.5 tabs)  Follow up with Dr. Claiborne Billings in 2 weeks to determine need for rt and lt heart cath.  Call if pain increases.    We will schedule sleep study  split night.

## 2013-09-08 NOTE — Progress Notes (Signed)
09/08/2013   PCP: Allyne Gee, MD   Chief Complaint  Patient presents with  . Hospitalization Follow-up    C/o chest pain on exertion, shortness of breath on exertion.    Primary Cardiologist: Dr. Claiborne Billings  HPI:  63 year old male with a history of type 2 diabetes mellitus, hypertension, atrial fibrillation who is status post ablation, as well as small chronic pericardial effusion documented for at least 7 years. He presented to [facility] on 06/27/2013 with chest pain radiating to his upper extremity and back. Cardiac markers were positive with a troponin of 7.54 compatible with NSTEMI. He underwent acute catheterization by Dr. Claiborne Billings on 06/27/2013 and was found to have high-grade diffuse 90+ % distal RCA stenoses beyond the acute margin and proximal to the PDA takeoff and mild 10 at 20% narrowing in the LAD with a normal circumflex vessel. EF 45-50%. He underwent successful stenting of his distal RCA and a Xience Alpine DES stent 3.0x23 mm was inserted and post dilated to 3.25 mm. He was treated with aspirin and Brilinta for dual antiplatelet therapy. His troponin peaked at 20. Post discharge, he developed recurrent chest pain leading to readmission and he underwent repeat cardiac catheterization by Dr. Shon Hough on 07/10/13. His RCA stent was widely patent and ejection fraction was 40%.He later saw [name], PAC on 07/17/13 and his pulse was 100. His beta blocker was increased from Toprol-XL 25 to 50 mg and now at 100 mg daily with HR 95. He has experienced some episodes of chest discomfort since that time. He presented to cardiac rehabilitation 2 weeks ago and because of his recent chest pain they did not feel comfortable having him exercise and saw Dr.Kelly in our office on 07/29/13. He had another cardiac cath by Dr. Claiborne Billings 08/28/13, No significant restenosis with a widely patent stent in the distal right carotid artery and evidence for previously noted percent mid RCA stenosis  -Normal LAD with its proximal segment which may dip intramyocardially without evidence for systolic bridging. -Normal left circumflex coronary artery.  He has a chronic peicardial effusion since at least 2008. An echo Doppler study done on 06/27/2013 revealed an ejection fraction of 40% with grade 2 diastolic dysfunction. A small circumferential pericardial effusion with no evidence for tamponade was again demonstrated. Dr. Roxan Hockey was consulted with most recent hospitalization. He did not believe a pericardial window would be much help.  His recommendation to continue colchicine and steroids.  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 He states that his wife seems to notice abnormal breathing during sleep with apparent possible Cheyne-Stokes respirations. She checked his oxygen saturation this did drop to 82%. When I saw him, I recommended that he undergo a sleep study. This has not yet been done but is scheduled for 09/01/2013- but was cancelled due to repeat hospitalization.  Today he relates continued episodes of chest pain and even into his lt arm.  1 SL NTG relieve the pain. Occ he is SOB with lying on his rt side.  He has mild SOB otherwise.   Allergies  Allergen Reactions  . Codeine Nausea And Vomiting  . Other Nausea And Vomiting    The patient states that he has had  a reaction to all narcotics. He can take them though.    Current Outpatient Prescriptions  Medication Sig Dispense Refill  . Ascorbic Acid (VITAMIN C) 1000 MG tablet Take 1,000 mg by mouth daily.      Marland Kitchen  aspirin EC 81 MG tablet Take 81 mg by mouth daily.      . B Complex-Biotin-FA (VITAMIN B50 COMPLEX PO) Take 1 capsule by mouth daily.      . baclofen (LIORESAL) 10 MG tablet Take 10 mg by mouth 3 (three) times daily.      Marland Kitchen buPROPion (WELLBUTRIN XL) 150 MG 24 hr tablet Take 150 mg by mouth daily.      . cholecalciferol (VITAMIN D) 1000 UNITS tablet Take 1,000 Units by mouth daily.      . colchicine 0.6 MG  tablet Take 0.5 tablets (0.3 mg total) by mouth 2 (two) times daily.  60 tablet  1  . cyclobenzaprine (FLEXERIL) 10 MG tablet Take 10 mg by mouth 3 (three) times daily as needed for muscle spasms.      . fenofibrate 54 MG tablet Take 54 mg by mouth at bedtime.      . gabapentin (NEURONTIN) 300 MG capsule Take 600-900 mg by mouth 3 (three) times daily. Take 661m in the morning, 60109min the afternoon, and 90012mt bedtime.      . gMarland Kitchenimepiride (AMARYL) 2 MG tablet Take 2 mg by mouth 2 (two) times daily.       . iMarland Kitchenuprofen (ADVIL,MOTRIN) 200 MG tablet Take 800 mg by mouth every 6 (six) hours as needed (pain).      . insulin aspart (NOVOLOG) 100 UNIT/ML injection Inject 3-15 Units into the skin 3 (three) times daily with meals.      . Insulin Syringe-Needle U-100 27G X 1/2" 1 ML MISC 1 Syringe by Does not apply route 3 (three) times daily before meals.  100 each  1  . metoprolol succinate (TOPROL-XL) 100 MG 24 hr tablet Take 100 mg by mouth daily. Take with or immediately following a meal.      . nitroGLYCERIN (NITROSTAT) 0.4 MG SL tablet Place 0.4 mg under the tongue every 5 (five) minutes as needed for chest pain.      . predniSONE (DELTASONE) 10 MG tablet Start with 5 tabs daily and taper down by 1 tablet every 4 days until completed. 5 tabs x 4 days, 4 tabs x 4 days, etc.  60 tablet  0  . ranolazine (RANEXA) 1000 MG SR tablet Take 1 tablet (1,000 mg total) by mouth 2 (two) times daily.  60 tablet  11  . Ticagrelor (BRILINTA) 90 MG TABS tablet Take 90 mg by mouth 2 (two) times daily.      . Canagliflozin (INVOKANA) 300 MG TABS Take 300 mg by mouth daily.      . isosorbide mononitrate (IMDUR) 60 MG 24 hr tablet Take 1.5 tablets (90 mg total) by mouth daily.  45 tablet  6  . sitaGLIPtin-metformin (JANUMET) 50-500 MG per tablet Take 1 tablet by mouth 2 (two) times daily with a meal.       No current facility-administered medications for this visit.    Past Medical History  Diagnosis Date  .  Herniated cervical disc     c2-T1 fusion  . Atrial fibrillation     a. s/[facility].  . CMarland KitchenD (coronary artery disease)     a. NSTEMI (06/2013):  [facility] (06/27/2013):  pLAD 10-20, RCA 90, EF 45-50%, inf HK.  PCI:  Xience Alpine (3 x 23 mm) DES to the RCA. b. Relook cath 07/10/13 - patent stent. Imdur added. c. Recurrent CP after that - abnormal nuc 07/2017 with chest pain, dyspnea,  hypotensive response. Rest images with mid-basal inferior thinning. No stress images due to abrupt d/c of test. Relook cath 3/12 - no significant r  . Ischemic cardiomyopathy     a. Echo (06/28/2011): Inferior and inferoseptal akinesis, EF 85%, grade 2 diastolic dysfunction, MAC, small effusion (no tamponade). B. EF 40% by echo 06/2013, cath 07/2013, 55% by cath 08/2013 with subtle mild mid inferior hypocontractility.  Marland Kitchen HTN (hypertension)   . HLD (hyperlipidemia)   . Pericardial effusion     a. Chronic since hx of pericarditis in 2004. b. Mod by echo 06/2013, small by echo 06/2013.  Marland Kitchen Depression   . Anginal pain   . NSTEMI (non-ST elevated myocardial infarction) 06-27-2013  . Pneumonia ~ 2005  . Type II diabetes mellitus   . Osteoarthritis     "neck and lower back" (09/01/2013)  . DJD (degenerative joint disease) of cervical spine     "3 herniated discs" (09/01/2013)  . DJD (degenerative joint disease), lumbosacral     "bulging L5-S1" (09/01/2013)  . Chronic back pain greater than 3 months duration     Past Surgical History  Procedure Laterality Date  . Posterior cervical fusion/foraminotomy  2009    fusion c3-t1  . Atrial fibrillation ablation  2000    Dr Omelia Blackwater at [facility]  . Appendectomy  ~ 1965  . Tonsillectomy  1959  . Pulmonary vein ablation  2001    [facility] Omelia [facility]water at [facility]    TMB:PJPETKK:OE colds or fevers, no weight changes Skin:no rashes or ulcers HEENT:no blurred vision, no congestion CV:see HPI PUL:see HPI GI:no diarrhea constipation or melena, no indigestion GU:no hematuria, no  dysuria MS:no joint pain, no claudication Neuro:no syncope, no lightheadedness Endo:+ diabetes on insulin while on steroids, no thyroid disease  PHYSICAL EXAM BP 110/72  Pulse 95  Ht 5' 7"  (1.702 m)  Wt 183 lb 6.4 oz (83.19 kg)  BMI 28.72 kg/m2 General:Pleasant affect, NAD Skin:Warm and dry, brisk capillary refill HEENT:normocephalic, sclera clear, mucus membranes moist Neck:supple, no JVD, no bruits  Heart:S1S2 RRR without murmur, gallup, rub or click Lungs:clear without rales, rhonchi, or wheezes CXF:QHKU, non tender, + BS, do not palpate liver spleen or masses Ext:no lower ext edema, 2+ pedal pulses, 2+ radial pulses Neuro:alert and oriented, MAE, follows commands, + facial symmetry  EKG: SR no acute changes from the hospital EKG.  ASSESSMENT AND PLAN Acute pericarditis, unspecified - possible Again difficult to say if pericarditis.  Occurs with rest and exertion.  Relief with NTG, 1 sl. Each time.  Chest pain with moderate risk for cardiac etiology - etiology likely pericarditis Pain continues with steroids and colchicine.  NTG resolves the pain.  I am increasing IMDUR to 90 mg daily.  He will follow up with Dr. Claiborne Billings in 2 weeks to determine need for rt and Lt heart cath.  Unstable angina Possible combination of pericarditis and unstable angina.  Cath was stable.  Dr. Claiborne Billings aware, of situation.  Will follow up in 2 weeks unless problems before then.

## 2013-09-08 NOTE — Assessment & Plan Note (Signed)
Possible combination of pericarditis and unstable angina.  Cath was stable.  Dr. Claiborne Billings aware, of situation.  Will follow up in 2 weeks unless problems before then.

## 2013-09-08 NOTE — Assessment & Plan Note (Signed)
Again difficult to say if pericarditis.  Occurs with rest and exertion.  Relief with NTG, 1 sl. Each time.

## 2013-09-08 NOTE — Assessment & Plan Note (Signed)
Pain continues with steroids and colchicine.  NTG resolves the pain.  I am increasing IMDUR to 90 mg daily.  He will follow up with Dr. Claiborne Billings in 2 weeks to determine need for rt and Lt heart cath.

## 2013-09-09 ENCOUNTER — Telehealth: Payer: Self-pay

## 2013-09-09 NOTE — Telephone Encounter (Signed)
Prior authorization for Brilinta faxed to [employer]. Awaiting approval.

## 2013-09-10 ENCOUNTER — Telehealth: Payer: Self-pay | Admitting: Cardiology

## 2013-09-10 ENCOUNTER — Encounter (HOSPITAL_COMMUNITY): Payer: 59

## 2013-09-10 ENCOUNTER — Telehealth: Payer: Self-pay | Admitting: Cardiovascular Disease

## 2013-09-10 ENCOUNTER — Ambulatory Visit (HOSPITAL_COMMUNITY): Payer: 59

## 2013-09-10 NOTE — Telephone Encounter (Signed)
Please call her-regarding Kristopher [name].She wanted to speak to [place].

## 2013-09-10 NOTE — Telephone Encounter (Signed)
Dr. Gwenlyn Found notified and advised pt be added on to see Dr. Claiborne Billings tomorrow.    Returned call and pt verified x 2.   Pt informed message received and Dr. Claiborne Billings out of office.  Informed Dr. Gwenlyn Found notified and advised he come in tomorrow to see Dr. Claiborne Billings.  Pt agreed w/ plan and advised to come in tomorrow at 10 am.  Informed he will be worked in at the end of clinic.  Pt verbalized understanding and agreed w/ plan.  Appt scheduled for 10:15 am w/ Dr. Claiborne Billings per Dr. Gwenlyn Found.  Pt denied any worsening symptoms since OV.  C/o intermittent radiation to arms and stated that is not new.

## 2013-09-10 NOTE — Telephone Encounter (Signed)
Kristopher [name] told him if he continue to have chest pains to please call, He is still having them.

## 2013-09-10 NOTE — Telephone Encounter (Signed)
Amber took message and discussed with Dr. Gwenlyn Found.Patient is scheduled to see Dr. Claiborne Billings tomorrow.

## 2013-09-11 ENCOUNTER — Encounter: Payer: Self-pay | Admitting: Cardiovascular Disease

## 2013-09-11 ENCOUNTER — Ambulatory Visit (INDEPENDENT_AMBULATORY_CARE_PROVIDER_SITE_OTHER): Payer: 59 | Admitting: Cardiovascular Disease

## 2013-09-11 VITALS — BP 112/74 | HR 77 | Ht 67.0 in | Wt 182.2 lb

## 2013-09-11 DIAGNOSIS — R079 Chest pain, unspecified: Secondary | ICD-10-CM

## 2013-09-11 DIAGNOSIS — E785 Hyperlipidemia, unspecified: Secondary | ICD-10-CM

## 2013-09-11 DIAGNOSIS — I319 Disease of pericardium, unspecified: Secondary | ICD-10-CM

## 2013-09-11 DIAGNOSIS — I214 Non-ST elevation (NSTEMI) myocardial infarction: Secondary | ICD-10-CM

## 2013-09-11 DIAGNOSIS — I10 Essential (primary) hypertension: Secondary | ICD-10-CM

## 2013-09-11 DIAGNOSIS — I2510 Atherosclerotic heart disease of native coronary artery without angina pectoris: Secondary | ICD-10-CM

## 2013-09-11 DIAGNOSIS — I313 Pericardial effusion (noninflammatory): Secondary | ICD-10-CM

## 2013-09-11 DIAGNOSIS — I4891 Unspecified atrial fibrillation: Secondary | ICD-10-CM

## 2013-09-11 DIAGNOSIS — I309 Acute pericarditis, unspecified: Secondary | ICD-10-CM

## 2013-09-11 DIAGNOSIS — I3139 Other pericardial effusion (noninflammatory): Secondary | ICD-10-CM

## 2013-09-11 MED ORDER — ISOSORBIDE MONONITRATE ER 60 MG PO TB24: ORAL_TABLET | ORAL | Status: DC

## 2013-09-11 NOTE — Patient Instructions (Addendum)
Your physician has recommended you make the following change in your medication: take the isosorbide 90 mg in the morning an d 30 mg in the PM.  Get some over the counter prilosec and take as directed on the box.  Your physician recommends that you keep a follow-up appointment that you already have scheduled on 09/25/13 with Cecilie Kicks.

## 2013-09-12 ENCOUNTER — Telehealth: Payer: Self-pay | Admitting: Cardiovascular Disease

## 2013-09-12 ENCOUNTER — Encounter (HOSPITAL_COMMUNITY): Payer: 59

## 2013-09-12 ENCOUNTER — Ambulatory Visit (HOSPITAL_COMMUNITY): Payer: 59

## 2013-09-12 NOTE — Telephone Encounter (Signed)
Please call-concerning Aurther Loft.

## 2013-09-15 ENCOUNTER — Encounter (HOSPITAL_COMMUNITY): Payer: 59

## 2013-09-15 ENCOUNTER — Ambulatory Visit (HOSPITAL_COMMUNITY): Payer: 59

## 2013-09-16 ENCOUNTER — Telehealth: Payer: Self-pay | Admitting: Cardiovascular Disease

## 2013-09-16 ENCOUNTER — Telehealth (HOSPITAL_COMMUNITY): Payer: Self-pay | Admitting: *Deleted

## 2013-09-16 NOTE — Telephone Encounter (Signed)
Please call-concerned because he has 2 appointment for his sleep study.

## 2013-09-16 NOTE — Telephone Encounter (Signed)
Returned a call to patient. He was concerned because he has 2 appointments scheduled to have a sleep study. One through [facility], and the other one @ [facility].  He questioned which appointment should he keep. I told him that he can keep which ever one he chooses will be fine. 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 The one scheduled at [facility] is scheduled in April. The one at [facility] is scheduled in May. He has some concerns that the appointments are scheduled 1 month apart.  I stated to him that if he has some timing concerns then he can call the [facility] sleep lab to see if they can schedule him a sooner appointment or place him on a cancellation list. I informed the patient that since he plans to keep the [place] [place] appointment I will call [facility] to cancel the other appointment.

## 2013-09-17 ENCOUNTER — Ambulatory Visit (HOSPITAL_COMMUNITY): Payer: 59

## 2013-09-17 ENCOUNTER — Encounter (HOSPITAL_COMMUNITY): Payer: 59

## 2013-09-18 NOTE — Telephone Encounter (Signed)
close

## 2013-09-19 ENCOUNTER — Ambulatory Visit (HOSPITAL_COMMUNITY): Payer: 59

## 2013-09-19 ENCOUNTER — Encounter (HOSPITAL_COMMUNITY): Payer: 59

## 2013-09-22 ENCOUNTER — Ambulatory Visit (HOSPITAL_COMMUNITY): Payer: 59

## 2013-09-22 ENCOUNTER — Encounter (HOSPITAL_COMMUNITY): Payer: 59

## 2013-09-22 ENCOUNTER — Telehealth: Payer: Self-pay | Admitting: Cardiovascular Disease

## 2013-09-22 NOTE — Telephone Encounter (Signed)
She wanted to know if you received her FMLA papers? Her employer said they have faxed them over,but have not heard back from you.Marland Kitchen

## 2013-09-22 NOTE — Telephone Encounter (Signed)
Left message [employer] not received. Have them re-faxed to [phone].

## 2013-09-24 ENCOUNTER — Telehealth: Payer: Self-pay | Admitting: *Deleted

## 2013-09-24 ENCOUNTER — Ambulatory Visit: Payer: 59 | Admitting: Cardiology

## 2013-09-24 ENCOUNTER — Ambulatory Visit (HOSPITAL_COMMUNITY): Payer: 59

## 2013-09-24 ENCOUNTER — Encounter (HOSPITAL_COMMUNITY): Payer: 59

## 2013-09-24 NOTE — Telephone Encounter (Signed)
Spoke with Benjamine Mola, pharmacist @ [facility] outpatient pharmacy. Informed her that we received her request for the patient's diabetic supplies including the new meter that he received. Advised her that she will need to get order from whoever follows him for his diabetes. Dr. Claiborne Billings follows  his cardiac care. She states the patient told her that he does not know who monitors his diabetes. I suggested that she call Dr. Devona Konig. This is who we have listed as the patient's PCP.

## 2013-09-25 ENCOUNTER — Ambulatory Visit: Payer: 59 | Admitting: Cardiology

## 2013-09-25 ENCOUNTER — Ambulatory Visit (INDEPENDENT_AMBULATORY_CARE_PROVIDER_SITE_OTHER): Payer: 59 | Admitting: Cardiology

## 2013-09-25 ENCOUNTER — Encounter: Payer: Self-pay | Admitting: Cardiovascular Disease

## 2013-09-25 ENCOUNTER — Encounter: Payer: Self-pay | Admitting: Cardiology

## 2013-09-25 VITALS — BP 98/60 | HR 92 | Ht 67.0 in | Wt 184.5 lb

## 2013-09-25 DIAGNOSIS — I200 Unstable angina: Secondary | ICD-10-CM

## 2013-09-25 DIAGNOSIS — Z79899 Other long term (current) drug therapy: Secondary | ICD-10-CM

## 2013-09-25 DIAGNOSIS — I319 Disease of pericardium, unspecified: Secondary | ICD-10-CM

## 2013-09-25 DIAGNOSIS — I313 Pericardial effusion (noninflammatory): Secondary | ICD-10-CM

## 2013-09-25 DIAGNOSIS — R5383 Other fatigue: Secondary | ICD-10-CM

## 2013-09-25 DIAGNOSIS — I3139 Other pericardial effusion (noninflammatory): Secondary | ICD-10-CM

## 2013-09-25 DIAGNOSIS — R5381 Other malaise: Secondary | ICD-10-CM

## 2013-09-25 DIAGNOSIS — I214 Non-ST elevation (NSTEMI) myocardial infarction: Secondary | ICD-10-CM

## 2013-09-25 DIAGNOSIS — Z01818 Encounter for other preprocedural examination: Secondary | ICD-10-CM

## 2013-09-25 DIAGNOSIS — R079 Chest pain, unspecified: Secondary | ICD-10-CM

## 2013-09-25 DIAGNOSIS — I4891 Unspecified atrial fibrillation: Secondary | ICD-10-CM

## 2013-09-25 DIAGNOSIS — D689 Coagulation defect, unspecified: Secondary | ICD-10-CM

## 2013-09-25 DIAGNOSIS — I2510 Atherosclerotic heart disease of native coronary artery without angina pectoris: Secondary | ICD-10-CM

## 2013-09-25 MED ORDER — CLOPIDOGREL BISULFATE 75 MG PO TABS: 75.0000 mg | ORAL_TABLET | Freq: Every day | ORAL | Status: DC

## 2013-09-25 NOTE — Patient Instructions (Addendum)
We will schedule you for Rt and Lt heart cath.  Not next week but the week of the 20th.  Stop your Brilinta after pm dose and begin Plavix the next day.  Have your labs done 7 days prior to your Heart Cath

## 2013-09-26 ENCOUNTER — Encounter (HOSPITAL_COMMUNITY): Payer: 59

## 2013-09-26 ENCOUNTER — Ambulatory Visit (HOSPITAL_COMMUNITY): Payer: 59

## 2013-09-27 ENCOUNTER — Encounter: Payer: 59 | Attending: Cardiovascular Disease

## 2013-09-27 VITALS — Ht <= 58 in | Wt 184.2 lb

## 2013-09-27 DIAGNOSIS — Z713 Dietary counseling and surveillance: Secondary | ICD-10-CM | POA: Insufficient documentation

## 2013-09-27 DIAGNOSIS — E119 Type 2 diabetes mellitus without complications: Secondary | ICD-10-CM | POA: Insufficient documentation

## 2013-09-27 NOTE — Progress Notes (Signed)
Patient was seen on 09/27/13 for the complete diabetes self-management series at the [facility]. This is a part of the [facility] [employer].  Current A1c = Unknown  Handouts given during class include:  Living Well with Diabetes book  Carb Counting and Meal Planning book  Meal Plan Card  Carbohydrate guide  Meal planning worksheet  Low Sodium Flavoring Tips  The diabetes portion plate  Low Carbohydrate Snack Suggestions  A1c to eAG Conversion Chart  Diabetes Medications  Stress Management  Diabetes Recommended Care Schedule  Diabetes Success Plan  Core Class Satisfaction Survey  The following learning objectives were met by the patient during this course:  Describe diabetes  State some common risk factors for diabetes  Defines the role of glucose and insulin  Identifies type of diabetes and pathophysiology  Describe the relationship between diabetes and cardiovascular risk  State the members of the Healthcare Team  States the rationale for glucose monitoring  State when to test glucose  State their individual Target Range  State the importance of logging glucose readings  Describe how to interpret glucose readings  Identifies A1C target  Explain the correlation between A1c and eAG values  State symptoms and treatment of high blood glucose  State symptoms and treatment of low blood glucose  Explain proper technique for glucose testing  Identifies proper sharps disposal  Describe the role of different macronutrients on glucose  Explain how carbohydrates affect blood glucose  State what foods contain the most carbohydrates  Demonstrate carbohydrate counting  Demonstrate how to read Nutrition Facts food label  Describe effects of various fats on heart health  Describe the importance of good nutrition for health and healthy eating strategies  Describe techniques for managing your shopping, cooking and meal  planning  List strategies to follow meal plan when dining out  Describe the effects of alcohol on glucose and how to use it safely   State the amount of activity recommended for healthy living   Describe activities suitable for individual needs   Identify ways to regularly incorporate activity into daily life   Identify barriers to activity and ways to over come these barriers  Identify diabetes medications being personally used and their primary action for lowering glucose and possible side effects   Describe role of stress on blood glucose and develop strategies to address psychosocial issues   Identify diabetes complications and ways to prevent them  Explain how to manage diabetes during illness   Evaluate success in meeting personal goal   Establish 2-3 goals that they will plan to diligently work on until they return for the  12-monthfollow-up visit  Goals:  Follow Diabetes Meal Plan as instructed  Eat 3 meals and 2 snacks, every 3-5 hrs  Limit carbohydrate intake to 45 grams carbohydrate/meal Limit carbohydrate intake to 15 grams carbohydrate/snack Add lean protein foods to meals/snacks  Monitor glucose levels as instructed by your doctor  Aim for 15-30 mins of physical activity daily as tolerated  Bring food record and glucose log to your next nutrition visit  Your patient has established the following 4 month goals in their individualized success plan: I will count my carb choices at most meals and snacks I will increase my activity level at by 30 minutes at least 5 days a week  Your patient has identified these potential barriers to change:  Stress  Steroid treatment  Your patient has identified their diabetes self-care support plan as  Family support  Plan: Attend Core 4 in 4 months

## 2013-09-28 NOTE — Assessment & Plan Note (Signed)
Hx of MI in Jan 2015

## 2013-09-28 NOTE — Assessment & Plan Note (Signed)
Chronic. 

## 2013-09-28 NOTE — Progress Notes (Signed)
09/28/2013   PCP: Allyne Gee, MD   Chief Complaint  Patient presents with  . discuss right heart cath    c/o chest pain (most 3 in 24 hours - usually 1-2 daily, precluded by SOB)    Primary Cardiologist:  Dr. Claiborne Billings  HPI:  63 year old male with a history of type 2 diabetes mellitus, hypertension, atrial fibrillation who is status post ablation, as well as small chronic pericardial effusion documented for at least 7 years. He presented to [facility] on 06/27/2013 with chest pain radiating to his upper extremity and back. Cardiac markers were positive with a troponin of 7.54 compatible with NSTEMI. He underwent acute catheterization by Dr. Claiborne Billings on 06/27/2013 and was found to have high-grade diffuse 90+ % distal RCA stenoses beyond the acute margin and proximal to the PDA takeoff and mild 10 at 20% narrowing in the LAD with a normal circumflex vessel. EF 45-50%. He underwent successful stenting of his distal RCA and a Xience Alpine DES stent 3.0x23 mm was inserted and post dilated to 3.25 mm. He was treated with aspirin and Brilinta for dual antiplatelet therapy. His troponin peaked at 20. Post discharge, he developed recurrent chest pain leading to readmission and he underwent repeat cardiac catheterization by Dr. Shon Hough on 07/10/13. His RCA stent was widely patent and ejection fraction was 40%.He later saw [name], PAC on 07/17/13 and his pulse was 100. His beta blocker was increased from Toprol-XL 25 to 50 mg and now at 100 mg daily with HR 95. He has experienced some episodes of chest discomfort since that time. He presented to cardiac rehabilitation 2 weeks ago and because of his recent chest pain they did not feel comfortable having him exercise and saw Dr.Kelly in our office on 07/29/13. He had another cardiac cath by Dr. Claiborne Billings 08/28/13, No significant restenosis with a widely patent stent in the distal right carotid artery and evidence for previously noted percent mid  RCA stenosis -Normal LAD with its proximal segment which may dip intramyocardially without evidence for systolic bridging. -Normal left circumflex coronary artery.   He has a chronic peicardial effusion since at least 2008. An echo Doppler study done on 06/27/2013 revealed an ejection fraction of 40% with grade 2 diastolic dysfunction. A small circumferential pericardial effusion with no evidence for tamponade was again demonstrated. Dr. Roxan Hockey was consulted with most recent hospitalization. He did not believe a pericardial window would be much help. His recommendation to continue colchicine and steroids.   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 He states that his wife seems to notice abnormal breathing during sleep with apparent possible Cheyne-Stokes respirations. She checked his oxygen saturation this did drop to 82%. When I saw him, I recommended that he undergo a sleep study. This has not yet been done but is scheduled for 09/01/2013- but was cancelled due to repeat hospitalization.  Today he relates continued episodes of chest pain and even into his lt arm. 1 SL NTG relieve the pain. Occ he is SOB with lying on his rt side. He has mild SOB otherwise.   Today he is back for follow up with Dr. Claiborne Billings available, there has been no change is symptoms since last visit.  Continues with chest pain, relief with NTG.  No changes with increase in medications.  Discussed with Dr. Claiborne Billings.   Allergies  Allergen Reactions  . Codeine Nausea And Vomiting  . Other Nausea And Vomiting  The patient states that he has had  a reaction to all narcotics. He can take them though.    Current Outpatient Prescriptions  Medication Sig Dispense Refill  . Ascorbic Acid (VITAMIN C) 1000 MG tablet Take 1,000 mg by mouth daily.      Marland Kitchen aspirin EC 81 MG tablet Take 81 mg by mouth daily.      . B Complex-Biotin-FA (VITAMIN B50 COMPLEX PO) Take 1 capsule by mouth daily.      . baclofen (LIORESAL) 10 MG  tablet Take 10 mg by mouth 3 (three) times daily.      Marland Kitchen buPROPion (WELLBUTRIN XL) 150 MG 24 hr tablet Take 150 mg by mouth daily.      . cholecalciferol (VITAMIN D) 1000 UNITS tablet Take 1,000 Units by mouth daily.      . colchicine 0.6 MG tablet Take 0.5 tablets (0.3 mg total) by mouth 2 (two) times daily.  60 tablet  1  . cyclobenzaprine (FLEXERIL) 10 MG tablet Take 10 mg by mouth 3 (three) times daily as needed for muscle spasms.      . fenofibrate 54 MG tablet Take 54 mg by mouth at bedtime.      . gabapentin (NEURONTIN) 300 MG capsule Take 600-900 mg by mouth 3 (three) times daily. Take 667m in the morning, 6053min the afternoon, and 90080mt bedtime.      . gMarland Kitchenimepiride (AMARYL) 2 MG tablet Take 2 mg by mouth 2 (two) times daily.       . iMarland Kitchenuprofen (ADVIL,MOTRIN) 200 MG tablet Take 800 mg by mouth every 6 (six) hours as needed (pain).      . insulin aspart (NOVOLOG) 100 UNIT/ML injection Inject 3-15 Units into the skin 3 (three) times daily with meals.      . isosorbide mononitrate (IMDUR) 60 MG 24 hr tablet Take 1 & 1/2 tablet in the morning and 1/2 tablet in the PM  180 tablet  1  . metoprolol succinate (TOPROL-XL) 100 MG 24 hr tablet Take 100 mg by mouth daily. Take with or immediately following a meal.      . nitroGLYCERIN (NITROSTAT) 0.4 MG SL tablet Place 0.4 mg under the tongue every 5 (five) minutes as needed for chest pain.      . predniSONE (DELTASONE) 10 MG tablet Start with 5 tabs daily and taper down by 1 tablet every 4 days until completed. 5 tabs x 4 days, 4 tabs x 4 days, etc.  60 tablet  0  . ranolazine (RANEXA) 1000 MG SR tablet Take 1 tablet (1,000 mg total) by mouth 2 (two) times daily.  60 tablet  11  . sitaGLIPtin-metformin (JANUMET) 50-500 MG per tablet Take 1 tablet by mouth 2 (two) times daily with a meal.      . clopidogrel (PLAVIX) 75 MG tablet Take 1 tablet (75 mg total) by mouth daily.  30 tablet  11   No current facility-administered medications for this visit.     Past Medical History  Diagnosis Date  . Herniated cervical disc     c2-T1 fusion  . Atrial fibrillation     a. [facility]  . CMarland KitchenD (coronary artery disease)     a. NSTEMI (06/2013):  [facility] (06/27/2013):  pLAD 10-20, RCA 90, EF 45-50%, inf HK.  PCI:  Xience Alpine (3 x 23 mm) DES to the RCA. b. Relook cath 07/10/13 - patent stent. Imdur added. c. Recurrent CP after  that - abnormal nuc 07/2017 with chest pain, dyspnea, hypotensive response. Rest images with mid-basal inferior thinning. No stress images due to abrupt d/c of test. Relook cath 3/12 - no significant r  . Ischemic cardiomyopathy     a. Echo (06/28/2011): Inferior and inferoseptal akinesis, EF 32%, grade 2 diastolic dysfunction, MAC, small effusion (no tamponade). B. EF 40% by echo 06/2013, cath 07/2013, 55% by cath 08/2013 with subtle mild mid inferior hypocontractility.  Marland Kitchen HTN (hypertension)   . HLD (hyperlipidemia)   . Pericardial effusion     a. Chronic since hx of pericarditis in 2004. b. Mod by echo 06/2013, small by echo 06/2013.  Marland Kitchen Depression   . Anginal pain   . NSTEMI (non-ST elevated myocardial infarction) 06-27-2013  . Pneumonia ~ 2005  . Type II diabetes mellitus   . Osteoarthritis     "neck and lower back" (09/01/2013)  . DJD (degenerative joint disease) of cervical spine     "3 herniated discs" (09/01/2013)  . DJD (degenerative joint disease), lumbosacral     "bulging L5-S1" (09/01/2013)  . Chronic back pain greater than 3 months duration     Past Surgical History  Procedure Laterality Date  . Posterior cervical fusion/foraminotomy  2009    fusion c3-t1  . Atrial fibrillation ablation  2000    Dr Omelia Blackwater at [facility]  . Appendectomy  ~ 1965  . Tonsillectomy  1959  . Pulmonary vein ablation  2001    Dr Omelia Blackwater at [facility]    PQD:IYMEBRA:XE colds or fevers, no weight changes Skin:no rashes or ulcers HEENT:no blurred vision, no congestion CV:see HPI PUL:see HPI GI:no diarrhea constipation  or melena, no indigestion GU:no hematuria, no dysuria MS:no joint pain, no claudication Neuro:no syncope, no lightheadedness Endo:no diabetes, no thyroid disease PHYSICAL EXAM BP 98/60  Pulse 92  Ht 5' 7"  (1.702 m)  Wt 184 lb 8 oz (83.689 kg)  BMI 28.89 kg/m2 General:Pleasant affect, NAD Skin:Warm and dry, brisk capillary refill HEENT:normocephalic, sclera clear, mucus membranes moist Neck:supple, no JVD, no bruits  Heart:S1S2 RRR without murmur, gallup, rub or click Lungs:clear without rales, rhonchi, or wheezes NMM:HWKG, non tender, + BS, do not palpate liver spleen or masses Ext:no lower ext edema, 2+ pedal pulses, 2+ radial pulses Neuro:alert and oriented, MAE, follows commands, + facial symmetry EKG:SR no acute changes from previous  ASSESSMENT AND PLAN Unstable angina Chest pain continues with relief with NTG, adjusting his meds has not resolved his pain.  Unsure if this is from pericarditis vs angina.  Discussed with Dr. Claiborne Billings.  Plan for rt and lt heart cath.  In the mean time will change brilinta to plavix in case the brilinta is playing a role.  Will check p2y12 on arrival for cath.  Non-ST elevation myocardial infarction (NSTEMI), subendocardial infarction, subsequent episode of care Hx of MI in Jan 2015  Pericardial effusion Chronic   CAD (coronary artery disease)  pLAD 10-20, RCA 90, EF 45-50%, inf HK.  PCI:  Xience Alpine (3 x 23 mm) DES to the RCA.

## 2013-09-28 NOTE — Assessment & Plan Note (Signed)
Chest pain continues with relief with NTG, adjusting his meds has not resolved his pain.  Unsure if this is from pericarditis vs angina.  Discussed with Dr. Claiborne Billings.  Plan for rt and lt heart cath.  In the mean time will change brilinta to plavix in case the brilinta is playing a role.  Will check p2y12 on arrival for cath.

## 2013-09-28 NOTE — Assessment & Plan Note (Signed)
pLAD 10-20, RCA 90, EF 45-50%, inf HK.  PCI:  Xience Alpine (3 x 23 mm) DES to the RCA.

## 2013-09-29 ENCOUNTER — Encounter (HOSPITAL_COMMUNITY): Payer: Self-pay | Admitting: Pharmacist

## 2013-09-29 ENCOUNTER — Encounter (HOSPITAL_COMMUNITY): Payer: 59

## 2013-09-29 ENCOUNTER — Ambulatory Visit (HOSPITAL_COMMUNITY): Payer: 59

## 2013-10-01 ENCOUNTER — Encounter (HOSPITAL_COMMUNITY): Payer: 59

## 2013-10-01 ENCOUNTER — Ambulatory Visit (HOSPITAL_COMMUNITY): Payer: 59

## 2013-10-02 LAB — COMPREHENSIVE METABOLIC PANEL
ALBUMIN: 4 g/dL (ref 3.5–5.2)
ALK PHOS: 63 U/L (ref 39–117)
ALT: 93 U/L — ABNORMAL HIGH (ref 0–53)
AST: 59 U/L — ABNORMAL HIGH (ref 0–37)
BUN: 18 mg/dL (ref 6–23)
CALCIUM: 9 mg/dL (ref 8.4–10.5)
CO2: 29 mEq/L (ref 19–32)
Chloride: 101 mEq/L (ref 96–112)
Creat: 0.9 mg/dL (ref 0.50–1.35)
Glucose, Bld: 231 mg/dL — ABNORMAL HIGH (ref 70–99)
Potassium: 4.1 mEq/L (ref 3.5–5.3)
Sodium: 137 mEq/L (ref 135–145)
Total Bilirubin: 0.5 mg/dL (ref 0.2–1.2)
Total Protein: 6.4 g/dL (ref 6.0–8.3)

## 2013-10-02 LAB — CBC WITH DIFFERENTIAL/PLATELET
BASOS PCT: 0 % (ref 0–1)
Basophils Absolute: 0 10*3/uL (ref 0.0–0.1)
Eosinophils Absolute: 0.2 10*3/uL (ref 0.0–0.7)
Eosinophils Relative: 4 % (ref 0–5)
HEMATOCRIT: 36.1 % — AB (ref 39.0–52.0)
HEMOGLOBIN: 12.6 g/dL — AB (ref 13.0–17.0)
Lymphocytes Relative: 19 % (ref 12–46)
Lymphs Abs: 1 10*3/uL (ref 0.7–4.0)
MCH: 33 pg (ref 26.0–34.0)
MCHC: 34.9 g/dL (ref 30.0–36.0)
MCV: 94.5 fL (ref 78.0–100.0)
MONO ABS: 0.4 10*3/uL (ref 0.1–1.0)
MONOS PCT: 8 % (ref 3–12)
NEUTROS ABS: 3.8 10*3/uL (ref 1.7–7.7)
Neutrophils Relative %: 69 % (ref 43–77)
Platelets: 235 10*3/uL (ref 150–400)
RBC: 3.82 MIL/uL — AB (ref 4.22–5.81)
RDW: 14 % (ref 11.5–15.5)
WBC: 5.5 10*3/uL (ref 4.0–10.5)

## 2013-10-02 LAB — APTT: APTT: 30 s (ref 24–37)

## 2013-10-02 LAB — PROTIME-INR
INR: 0.88 (ref <1.50)
Prothrombin Time: 11.9 seconds (ref 11.6–15.2)

## 2013-10-03 ENCOUNTER — Encounter (HOSPITAL_COMMUNITY): Payer: 59

## 2013-10-03 ENCOUNTER — Ambulatory Visit (HOSPITAL_COMMUNITY): Payer: 59

## 2013-10-03 LAB — URINALYSIS, MICROSCOPIC ONLY
Bacteria, UA: NONE SEEN
CASTS: NONE SEEN
Crystals: NONE SEEN
SQUAMOUS EPITHELIAL / LPF: NONE SEEN

## 2013-10-05 ENCOUNTER — Encounter: Payer: Self-pay | Admitting: Cardiovascular Disease

## 2013-10-05 NOTE — Progress Notes (Signed)
Patient ID: Kristopher [name], male   DOB: 1951-02-13, 62 y.o.   MRN: [id number]       HPI: Kristopher [name] is a 63 y.o. male  is seen in the office today as an add-on following his exercise Myoview study after he developed significant chest pain and a hypertensive blood pressure response to exercise.  Kristopher [name] is a 63 year old male with a history of type 2 diabetes mellitus, hypertension, atrial fibrillation who is status post ablation, as well as small chronic pericardial effusion documented for at least 7 years. He presented to [facility] on 06/27/2013 with chest pain radiating to his upper extremity and back. Cardiac markers were positive with a troponin of 7.54 compatible with NSTEMI. He underwent acute catheterization by me on 06/27/2013 and was found to have high-grade diffuse 90+ % distal RCA stenoses beyond the acute margin and proximal to the PDA takeoff  and mild 10 at 20% narrowing in the LAD with a normal circumflex vessel. EF 45-50%. He underwent successful stenting of his distal RCA and a Xience Alpine DES stent 3.0x23 mm was inserted and post dilated to 3.25 mm. He was treated with aspirin and Brilinta for dual antiplatelet therapy. His troponin peaked at 20.  Post discharge, he developed recurrent chest pain leading to readmission and he underwent repeat cardiac catheterization by Dr. Shon Hough on 07/10/13. His RCA stent was widely patent and ejection fraction was 40%.He later saw [name], PAC on 07/17/13 and his pulse was 100. His beta blocker was increased from Toprol-XL 25 to 50 mg. He has experienced some episodes of chest discomfort since that time. He presented to cardiac rehabilitation 2 weeks ago and because of his recent chest pain they did not feel comfortable having him exercise and saw me in our office on 07/29/13.   He has  a chronic peicardial effusion since at least 2008. An echo Doppler study done on 06/27/2013 revealed an ejection fraction of 40% with grade 2 diastolic  dysfunction. A small circumferential paracardial effusion with no evidence for temp and was again demonstrated.  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 He states that his wife seems to notice abnormal breathing during sleep with apparent possible Cheyne-Stokes respirations. She checked his oxygen saturation this did drop to 82%. When I saw him, I recommended that he undergo a sleep study. This has not yet been done but is scheduled for 09/01/2013.  Patient has continued to experience mild episodes of chest pressure. Oftentimes these are nonexertional. He did take sublingual nitroglycerin and felt improvement in approximately 5 minutes. He denies exertional chest pain. When I recently saw him I further titrate his Toprol to 100 mg daily. At the time of that evaluation he did have an increased pulse rate with ventricular trigeminy. He feels his pulse is more stable and he is not aware of any extra heartbeats.  When I last saw him, because of the repeat his recurrent episodes of chest pain I have recommended that he undergo an exercise Myoview study to make certain he has not developed any subsequent ischemia. He presented for this study today. His resting blood pressure was 125/81 and heart rate in the eighth. Rest nuclear images were obtained. He does size on the Bruce protocol total of 7 minutes and 24 seconds. During stage III of exercise he developed increasing shortness of breath chest tightness which he characterized as 6/10 wheezing and felt he could not go any further and therefore stopped. At that point, he  was less than 80% predicted maximal heart rate and consequently was not injected with his stress pharmaceutical dose. He had mild ST segments at rest and these increased to slightly 0.5 to less than 1.0 at peak stress. However, his blood pressure dropped to 87/50 with this episode of chest tightness.   Subtotally he underwent cardiac catheterization, which was done on 08/28/2013 per is  no evidence for restenosis of his widely patent stent in the distal right coronary artery and a previously noted 30%.  Mild narrowing in the mid RCA.  The LAD was normal in its proximal segment with mild intramyocardial dipping without evidence for systolic bridging.  He had a normal left circumflex coronary artery.  Subsequently he was again readmitted to the hospital with recurrent chest pain for days after his cardiac catheterization.  During that hospitalization he was also evaluated by Dr. Roxan Hockey in light of his pericardial effusion.  It was not felt that he was a candidate for pericardial window.  [name].  During that time.  There was discussion concerning the possibility of bringing the patient back at some point following treatment with steroids of his pericarditis to consider doing a right heart catheterization to make certain there was no evidence for constrictive/restrictive physiology.  He has continued to experience chest pain, which he states is nitrate responsive.  He has been on steroids now for one week and presents for evaluation.  Past Medical History  Diagnosis Date  . Herniated cervical disc     c2-T1 fusion  . Atrial fibrillation     a. s/p PVI X2 by Dr Omelia Blackwater at [facility] around 2000.  Marland Kitchen CAD (coronary artery disease)     a. NSTEMI (06/2013):  LHC (06/27/2013):  pLAD 10-20, RCA 90, EF 45-50%, inf HK.  PCI:  Xience Alpine (3 x 23 mm) DES to the RCA. b. Relook cath 07/10/13 - patent stent. Imdur added. c. Recurrent CP after that - abnormal nuc 07/2017 with chest pain, dyspnea, hypotensive response. Rest images with mid-basal inferior thinning. No stress images due to abrupt d/c of test. Relook cath 3/12 - no significant r  . Ischemic cardiomyopathy     a. Echo (06/28/2011): Inferior and inferoseptal akinesis, EF 97%, grade 2 diastolic dysfunction, MAC, small effusion (no tamponade). B. EF 40% by echo 06/2013, cath 07/2013, 55% by cath 08/2013 with subtle mild mid  inferior hypocontractility.  Marland Kitchen HTN (hypertension)   . HLD (hyperlipidemia)   . Pericardial effusion     a. Chronic since hx of pericarditis in 2004. b. Mod by echo 06/2013, small by echo 06/2013.  Marland Kitchen Depression   . Anginal pain   . NSTEMI (non-ST elevated myocardial infarction) 06-27-2013  . Pneumonia ~ 2005  . Type II diabetes mellitus   . Osteoarthritis     "neck and lower back" (09/01/2013)  . DJD (degenerative joint disease) of cervical spine     "3 herniated discs" (09/01/2013)  . DJD (degenerative joint disease), lumbosacral     "bulging L5-S1" (09/01/2013)  . Chronic back pain greater than 3 months duration     Past Surgical History  Procedure Laterality Date  . Posterior cervical fusion/foraminotomy  2009    fusion c3-t1  . Atrial fibrillation ablation  2000    Dr Omelia Blackwater at [facility]  . Appendectomy  ~ 1965  . Tonsillectomy  1959  . Pulmonary vein ablation  2001    Dr Omelia Blackwater at [place]  .  Codeine Nausea And Vomiting  . Fentanyl Nausea And Vomiting  . Other Nausea And Vomiting    1. N/V to all opiates. He is able to take versed. (noted 09/29/13) 2.The patient states that he has had  a reaction to all narcotics. He can take them though. (noted 06/26/13)    Current Outpatient Prescriptions  Medication Sig Dispense Refill  . Ascorbic Acid (VITAMIN C) 1000 MG tablet Take 1,000 mg by mouth daily.      Marland Kitchen aspirin EC 81 MG tablet Take 81 mg by mouth daily.      . B Complex-Biotin-FA (VITAMIN B50 COMPLEX PO) Take 1 capsule by mouth daily.      . baclofen (LIORESAL) 10 MG tablet Take 10 mg by mouth 3 (three) times daily as needed for muscle spasms.       Marland Kitchen buPROPion (WELLBUTRIN XL) 150 MG 24 hr tablet Take 150 mg by mouth daily.      . cholecalciferol (VITAMIN D) 1000 UNITS tablet Take 1,000 Units by mouth daily.      . cyclobenzaprine (FLEXERIL) 10 MG tablet Take 10 mg by mouth 3 (three) times daily as needed for muscle spasms.      Marland Kitchen gabapentin  (NEURONTIN) 300 MG capsule Take 300-600 mg by mouth 3 (three) times daily. Take 311m in the morning, 3068min the afternoon, and 60018mt bedtime.      . gMarland Kitchenimepiride (AMARYL) 2 MG tablet Take 2 mg by mouth 2 (two) times daily.       . iMarland Kitchenuprofen (ADVIL,MOTRIN) 200 MG tablet Take 800 mg by mouth every 6 (six) hours as needed (pain).      . insulin aspart (NOVOLOG) 100 UNIT/ML injection Inject 3-15 Units into the skin 4 (four) times daily -  with meals and at bedtime. Per sliding scale.      . metoprolol succinate (TOPROL-XL) 100 MG 24 hr tablet Take 100 mg by mouth daily. Take with or immediately following a meal.      . nitroGLYCERIN (NITROSTAT) 0.4 MG SL tablet Place 0.4 mg under the tongue every 5 (five) minutes as needed for chest pain.      . ranolazine (RANEXA) 1000 MG SR tablet Take 1 tablet (1,000 mg total) by mouth 2 (two) times daily.  60 tablet  11  . sitaGLIPtin-metformin (JANUMET) 50-500 MG per tablet Take 1 tablet by mouth 2 (two) times daily with a meal.      . clopidogrel (PLAVIX) 75 MG tablet Take 1 tablet (75 mg total) by mouth daily.  30 tablet  11  . colchicine 0.6 MG tablet Take 0.6 mg by mouth daily.      . fenofibrate 160 MG tablet Take 160 mg by mouth at bedtime.      . isosorbide mononitrate (IMDUR) 60 MG 24 hr tablet Take 30-60 mg by mouth 2 (two) times daily. Takes 1 (31m20mvery morning and 1/2 tablet (30 mg) every evening.      . predniSONE (DELTASONE) 10 MG tablet Take 10 mg by mouth daily with breakfast. X 3 more days then stop.  (Initially started with 5 tabs daily and tapered down by 1 tablet every 4 days until completed. 5 tabs x 4 days, 4 tabs x 4 days, etc.)       No current facility-administered medications for this visit.    History   Social History  . Marital Status: Married    Spouse Name: N/A    Number of Children: N/A  . Years  of Education: N/A   Occupational History  . 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  Smoking status: Former Smoker -- 1.50 packs/day for 43 years    Types: Cigarettes    Quit date: 05/06/2007  . Smokeless tobacco: Never Used  . Alcohol Use: Yes     Comment: 09/01/2013 "might have a drink a couple times/yr"  . Drug Use: No  . Sexual Activity: Not Currently   Other Topics Concern  . Not on file   Social History Narrative  . No narrative on file   Socially he is married. 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 There is no tobacco use.  Family History  Problem Relation Age of Onset  . CAD Neg Hx     No hx early CAD  . COPD Other   . Cancer Other   . Diabetes Other   . Heart disease Other     ROS is negative for fevers, chills or night sweats. He denies headaches. He denies change in vision or hearing. He denies wheezing. He does note at times while shortness of breath. He denies PND or orthopnea. He has expressed recurrent episodes of chest discomfort described as tightness. He denies nausea vomiting or diarrhea. He's aware blood in stool or urine. He denies myalgias. He denies claudication symptoms. He is diabetic. He does have hyperlipidemia.  His sleep breathing is abnormal. He does have history of depression for which he has been on Wellbutrin. Other comprehensive 14 point system review is negative.  PE BP 112/74  Pulse 77  Ht 5' 7"  (1.702 m)  Wt 182 lb 3.2 oz (82.645 kg)  BMI 28.53 kg/m2  General: Alert, oriented, no distress.  Skin: normal turgor, no rashes HEENT: Normocephalic, atraumatic. Pupils round and reactive; sclera anicteric;no lid lag. Extraocular muscles intact. No Nose without nasal septal hypertrophy Mouth/Parynx benign; Mallinpatti scale 3 Neck: No JVD, no carotid bruits; normal carotid upstroke Lungs: clear to ausculatation and percussion; no wheezing or rales Chest wall: no tenderness to palpitation Heart:  trigeminal pattern rhythm, s1 s2 normal no S3 or S4 gallop. No rub. 1/6 systolic murmur; no  friction rub.  Abdomen: soft, nontender; no hepatosplenomehaly, BS+; abdominal aorta nontender and not dilated by palpation. Back: no CVA tenderness Pulses 2+ right femoral and right radial cath sites well healed  Extremities: no clubbing cyanosis or edema, Homan's sign negative  Neurologic: grossly nonfocal; cranial nerves grossly normal. Psychologic: normal affect and mood.  ECG (independently read by me): Normal sinus rhythm at 77 beats per minute.  PR interval 146 ms; QT interval 468 ms.  Nonspecific ST-T changes.  Recent ECG of 08/07/2013  (independently read by me): Sinus rhythm at 86; no ectopy  Prior ECG of 07/29/2013 (independently read by me): Underlying sinus rhythm with ventricular trigeminy. Sinus rate 97 beats per minute. A small Q wave in lead 3.  LABS:  BMET    Component Value Date/Time   NA 137 10/02/2013 0851   K 4.1 10/02/2013 0851   CL 101 10/02/2013 0851   CO2 29 10/02/2013 0851   GLUCOSE 231* 10/02/2013 0851   BUN 18 10/02/2013 0851   CREATININE 0.90 10/02/2013 0851   CREATININE 1.33 09/03/2013 0332   CALCIUM 9.0 10/02/2013 0851   GFRNONAA 57* 09/03/2013 0332   GFRAA 66* 09/03/2013 0332     Hepatic Function Panel     Component Value Date/Time  PROT 6.4 10/02/2013 0851   ALBUMIN 4.0 10/02/2013 0851   AST 59* 10/02/2013 0851   ALT 93* 10/02/2013 0851   ALKPHOS 63 10/02/2013 0851   BILITOT 0.5 10/02/2013 0851   BILIDIR <0.2 09/01/2013 1523   IBILI NOT CALCULATED 09/01/2013 1523     CBC    Component Value Date/Time   WBC 5.5 10/02/2013 0851   RBC 3.82* 10/02/2013 0851   HGB 12.6* 10/02/2013 0851   HCT 36.1* 10/02/2013 0851   PLT 235 10/02/2013 0851   MCV 94.5 10/02/2013 0851   MCH 33.0 10/02/2013 0851   MCHC 34.9 10/02/2013 0851   RDW 14.0 10/02/2013 0851   LYMPHSABS 1.0 10/02/2013 0851   MONOABS 0.4 10/02/2013 0851   EOSABS 0.2 10/02/2013 0851   BASOSABS 0.0 10/02/2013 0851     BNP    Component Value Date/Time   PROBNP 166.2* 09/01/2013 1523    Lipid Panel       Component Value Date/Time   CHOL 175 06/27/2013 0630   TRIG 152* 08/07/2013 0857   TRIG 126 06/27/2013 0630   HDL 31* 06/27/2013 0630   CHOLHDL 5.6 06/27/2013 0630   VLDL 25 06/27/2013 0630   LDLCALC 49 08/07/2013 0857   LDLCALC 119* 06/27/2013 0630     RADIOLOGY: No results found.    ASSESSMENT AND PLAN: Mr. Luther Springs is a 63 year old gentleman with diabetes mellitus for at least 7 years, a history of hyperlipidemia, who suffered a  non-ST segment elevation myocardial infarction secondary to high-grade distal RCA disease which was successfully stented on 06/27/2013. Repeat catheterization for recurrent chest pain showed the stent to be widely patent. He does have mild LV dysfunction.  He has continued to experience recurrent episodes of chest pain.  We did attempt to perform any an exercise Myoview study, but he became hypotensive with stress.  Prior to being able to be injected with the stress radioisotope.  Subsequent cardiac catheterization on 08/28/2013 again, showed a widely patent RCA stent, without significant progression of disease.  He did have a possible small segment of his LAD, which did intramyocardially.  He continues to experience chest pain, which he states is nitrate responsive.  I am further titrating his isosorbide to 90 mg in the morning and 30 mg in the evening.  He has been on steroids for one week for his presumed pericarditis.  I have suggested over-the-counter Prilosec.  He did undergo valuation for possible pericardial window, but it was not felt to be indicated based on his symptoms and pericardial effusion size.  I have recommended he see lower ankle back in the office in approximately 10 days for further evaluation.  We also discussed the potential for struct of sleep apnea in light of sleep disordered breathing.  I will review his followup evaluation with Cecilie Kicks once complete, and further recommendations will be  made at that time.  Troy Sine, MD, [facility]   10/05/2013 1:07 PM

## 2013-10-06 ENCOUNTER — Other Ambulatory Visit: Payer: Self-pay | Admitting: *Deleted

## 2013-10-06 ENCOUNTER — Encounter (HOSPITAL_COMMUNITY): Payer: 59

## 2013-10-06 ENCOUNTER — Ambulatory Visit (HOSPITAL_COMMUNITY): Payer: 59

## 2013-10-06 DIAGNOSIS — Z01812 Encounter for preprocedural laboratory examination: Secondary | ICD-10-CM

## 2013-10-06 DIAGNOSIS — R079 Chest pain, unspecified: Secondary | ICD-10-CM

## 2013-10-06 DIAGNOSIS — I2510 Atherosclerotic heart disease of native coronary artery without angina pectoris: Secondary | ICD-10-CM

## 2013-10-07 ENCOUNTER — Telehealth: Payer: Self-pay | Admitting: Cardiovascular Disease

## 2013-10-07 ENCOUNTER — Encounter (HOSPITAL_COMMUNITY)
Admission: RE | Disposition: A | Discharge status: Home / Self Care | Payer: Self-pay | Source: Ambulatory Visit | Attending: Cardiovascular Disease

## 2013-10-07 ENCOUNTER — Ambulatory Visit (HOSPITAL_COMMUNITY)
Admission: RE | Admit: 2013-10-07 | Discharge: 2013-10-07 | Disposition: A | Discharge status: Home / Self Care | Payer: 59 | Source: Ambulatory Visit | Attending: Cardiovascular Disease | Admitting: Cardiovascular Disease

## 2013-10-07 DIAGNOSIS — I10 Essential (primary) hypertension: Secondary | ICD-10-CM | POA: Insufficient documentation

## 2013-10-07 DIAGNOSIS — R079 Chest pain, unspecified: Secondary | ICD-10-CM

## 2013-10-07 DIAGNOSIS — E119 Type 2 diabetes mellitus without complications: Secondary | ICD-10-CM | POA: Insufficient documentation

## 2013-10-07 DIAGNOSIS — Z794 Long term (current) use of insulin: Secondary | ICD-10-CM | POA: Insufficient documentation

## 2013-10-07 DIAGNOSIS — Z01812 Encounter for preprocedural laboratory examination: Secondary | ICD-10-CM

## 2013-10-07 DIAGNOSIS — Z7982 Long term (current) use of aspirin: Secondary | ICD-10-CM | POA: Insufficient documentation

## 2013-10-07 DIAGNOSIS — M5020 Other cervical disc displacement, unspecified cervical region: Secondary | ICD-10-CM | POA: Insufficient documentation

## 2013-10-07 DIAGNOSIS — M5030 Other cervical disc degeneration, unspecified cervical region: Secondary | ICD-10-CM | POA: Insufficient documentation

## 2013-10-07 DIAGNOSIS — I2589 Other forms of chronic ischemic heart disease: Secondary | ICD-10-CM | POA: Insufficient documentation

## 2013-10-07 DIAGNOSIS — I4891 Unspecified atrial fibrillation: Secondary | ICD-10-CM | POA: Insufficient documentation

## 2013-10-07 DIAGNOSIS — I313 Pericardial effusion (noninflammatory): Secondary | ICD-10-CM

## 2013-10-07 DIAGNOSIS — M47812 Spondylosis without myelopathy or radiculopathy, cervical region: Secondary | ICD-10-CM | POA: Insufficient documentation

## 2013-10-07 DIAGNOSIS — G8929 Other chronic pain: Secondary | ICD-10-CM | POA: Insufficient documentation

## 2013-10-07 DIAGNOSIS — R9430 Abnormal result of cardiovascular function study, unspecified: Secondary | ICD-10-CM

## 2013-10-07 DIAGNOSIS — Z981 Arthrodesis status: Secondary | ICD-10-CM | POA: Insufficient documentation

## 2013-10-07 DIAGNOSIS — I252 Old myocardial infarction: Secondary | ICD-10-CM | POA: Insufficient documentation

## 2013-10-07 DIAGNOSIS — I3139 Other pericardial effusion (noninflammatory): Secondary | ICD-10-CM

## 2013-10-07 DIAGNOSIS — I519 Heart disease, unspecified: Secondary | ICD-10-CM | POA: Insufficient documentation

## 2013-10-07 DIAGNOSIS — E785 Hyperlipidemia, unspecified: Secondary | ICD-10-CM | POA: Insufficient documentation

## 2013-10-07 DIAGNOSIS — M47817 Spondylosis without myelopathy or radiculopathy, lumbosacral region: Secondary | ICD-10-CM | POA: Insufficient documentation

## 2013-10-07 DIAGNOSIS — Z79899 Other long term (current) drug therapy: Secondary | ICD-10-CM | POA: Insufficient documentation

## 2013-10-07 DIAGNOSIS — I319 Disease of pericardium, unspecified: Secondary | ICD-10-CM | POA: Insufficient documentation

## 2013-10-07 DIAGNOSIS — Z9861 Coronary angioplasty status: Secondary | ICD-10-CM | POA: Insufficient documentation

## 2013-10-07 DIAGNOSIS — I2510 Atherosclerotic heart disease of native coronary artery without angina pectoris: Secondary | ICD-10-CM | POA: Insufficient documentation

## 2013-10-07 HISTORY — PX: LEFT AND RIGHT HEART CATHETERIZATION WITH CORONARY ANGIOGRAM: SHX5449

## 2013-10-07 LAB — POCT I-STAT 3, VENOUS BLOOD GAS (G3P V)
Acid-Base Excess: 1 mmol/L (ref 0.0–2.0)
Bicarbonate: 26.7 mEq/L — ABNORMAL HIGH (ref 20.0–24.0)
O2 Saturation: 60 %
PCO2 VEN: 47.6 mmHg (ref 45.0–50.0)
PO2 VEN: 33 mmHg (ref 30.0–45.0)
TCO2: 28 mmol/L (ref 0–100)
pH, Ven: 7.356 — ABNORMAL HIGH (ref 7.250–7.300)

## 2013-10-07 LAB — GLUCOSE, CAPILLARY
Glucose-Capillary: 156 mg/dL — ABNORMAL HIGH (ref 70–99)
Glucose-Capillary: 161 mg/dL — ABNORMAL HIGH (ref 70–99)

## 2013-10-07 LAB — POCT I-STAT 3, ART BLOOD GAS (G3+)
BICARBONATE: 25.7 meq/L — AB (ref 20.0–24.0)
O2 Saturation: 94 %
PH ART: 7.375 (ref 7.350–7.450)
PO2 ART: 74 mmHg — AB (ref 80.0–100.0)
TCO2: 27 mmol/L (ref 0–100)
pCO2 arterial: 43.9 mmHg (ref 35.0–45.0)

## 2013-10-07 LAB — PLATELET INHIBITION P2Y12: PLATELET FUNCTION P2Y12: 280 [PRU] (ref 194–418)

## 2013-10-07 SURGERY — LEFT AND RIGHT HEART CATHETERIZATION WITH CORONARY ANGIOGRAM
Anesthesia: LOCAL

## 2013-10-07 MED ORDER — SODIUM CHLORIDE 0.9 % IJ SOLN
3.0000 mL | Freq: Two times a day (BID) | INTRAMUSCULAR | Status: DC
  Administered 2013-10-07: 3 mL via INTRAVENOUS

## 2013-10-07 MED ORDER — MIDAZOLAM HCL 2 MG/2ML IJ SOLN
INTRAMUSCULAR | Status: AC
  Filled 2013-10-07: qty 2

## 2013-10-07 MED ORDER — ASPIRIN 81 MG PO CHEW
81.0000 mg | CHEWABLE_TABLET | ORAL | Status: AC
  Administered 2013-10-07: 81 mg via ORAL
  Filled 2013-10-07: qty 1

## 2013-10-07 MED ORDER — SODIUM CHLORIDE 0.9 % IV SOLN: INTRAVENOUS | Status: DC

## 2013-10-07 MED ORDER — SODIUM CHLORIDE 0.9 % IV SOLN
INTRAVENOUS | Status: DC
  Administered 2013-10-07: 06:00:00 via INTRAVENOUS

## 2013-10-07 MED ORDER — FENTANYL CITRATE 0.05 MG/ML IJ SOLN
INTRAMUSCULAR | Status: AC
  Filled 2013-10-07: qty 2

## 2013-10-07 MED ORDER — LIDOCAINE HCL (PF) 1 % IJ SOLN
INTRAMUSCULAR | Status: AC
  Filled 2013-10-07: qty 30

## 2013-10-07 MED ORDER — HEPARIN (PORCINE) IN NACL 2-0.9 UNIT/ML-% IJ SOLN
INTRAMUSCULAR | Status: AC
  Filled 2013-10-07: qty 1500

## 2013-10-07 NOTE — Telephone Encounter (Signed)
Just had a catherization today and when he got home he is now numb from the site to his knee and inner thigh .Marland Kitchen Please call

## 2013-10-07 NOTE — Telephone Encounter (Signed)
Returned call and pt verified x 2 w/ Rose, pt's wife.  Stated pt w/ numbness from groin site to knee (R) since they got home today from hospital.  Stated she called Short Stay and they told her to call the office.  Denied pt w/ pain, swelling or discoloration.    Dr. Percival Spanish (DOD) notified and advised pt continue to monitor and if he develops pain and/or swelling to call back.  Wife informed and verbalized understanding.  After hours call instructions given as well.  Verablized understanding and agreed w/ plan.

## 2013-10-07 NOTE — Discharge Instructions (Signed)

## 2013-10-07 NOTE — CV Procedure (Signed)
Kristopher [name] is a 63 y.o. male    [id number]  [id number] LOCATION:  FACILITY: [facility]  PHYSICIAN: Troy Sine, MD, [facility] July 16, 1950   DATE OF PROCEDURE:  10/07/2013    CARDIAC CATHETERIZATION     HISTORY: Kristopher [name] is a 63 year old gentleman who suffered an acute coronary syndrome.  In January 2015 and underwent stenting of his right carotid artery beyond the acute margin.  He has continued to experience recurrent episodes of chest discomfort, which has led to that 2 additional repeat catheterizations.  He also has a history of a chronic pericardial effusion for the past 7 years.  He has continued to experience recurrent episodes of nitrate responsive chest pain.  He prior attempt at exercise Myoview study had to be aborted with chest pain, and an abnormal blood pressure response to exercise prior to injection of the radioisotope.  He now presents for definitive right and left heart catheterization to evaluate central for constrictive versus restrictive physiology in this patient with a chronic pericardial effusion and continued recurrent nitrate responsive chest pain.   PROCEDURE: Right and left heart catheterization; cardiac output determination by the thermodilution and Fick methods; right heart pressure determination before and after a 500 cc fluid bolus; biplane left ventriculography;  Coronary angiography  The patient was brought to the second floor [facility] cath lab in the postabsorptive state. He was premedicated with Versed 2 mg intravenously. His right groin was prepped and shaved in usual sterile fashion. Xylocaine 1% was used for local anesthesia.  A 5 French arterial sheath and 7 French venous sheath were inserted without difficulty.  Swan-Ganz catheterization was performed and pressures were obtained in the right atrium, right ventricle, pulmonary artery, common Wedge positions.  Cardiac output was determined by the thermodilution method as well as the assumed Fick  method.  A 5 French pigtail catheter was advanced via the arterial sheath into the central aorta.  Simultaneous central aortic and pulmonary artery pressures were recorded.  The pigtail catheter was passed into the left ventricle.  The right heart catheter was then pulled back into the right ventricle.  Simultaneous pressures were recorded.  Telephone note of inspiration effect on the pressures.  The patient was then administered.  A 500 cc normal saline bolus and additional pressures were recorded.  RA pressure was again recorded, which again did not disclose any findings of constrictive pericarditis.  A right heart catheter was then removed from the patient.  Biplane left ventriculography was performed in the RAO and LAO projections.  A careful LV AO pullback was performed.  The diagnostic coronary angiography was performed with 5 French Judkins 4 left and right coronary catheters.  Hemostasis was applied and by direct manual pressure.  The patient tolerated the procedure well and returned to short stay with plans for discharge later today.  HEMODYNAMICS:   Initial right heart pressures:  RA A wave 9, V-wave 10; mean 5.  No rapid Y descent RV: 30/6 PA: 28/14 PC: Mean 8  Central aorta 100/66 Pulmonary artery: 29/7  Left ventricular 99/8, post-A wave 14 PC: A wave 12 V wave 14, mean 11  LV 97/8, post-A wave 16 RV: 28/5, post-A wave There was concordance with the inspiratory pressure of both the RV and LV  Following  500 cc of normal saline bolus: RV 33/8/11 LV 90/16 Continued inspiratory concordance RA: Mean 7 without evidence for a rapid Y. Descent  Oxygen saturation: Central aorta 94%; pulmonary artery 60%  Cardiac output:  Fick method is 4.45 and thermodilution method 4.87 Cardiac Index index: 2.3, and thermodilution method 2.5    ANGIOGRAPHY:  Left main coronary artery was angiographically normal and bifurcated into the LAD and the circumflex system.  The LAD had mild proximal  calcification.  There is mild to 20% narrowing proximally prior to the first septal perforating artery.  The right proximal LAD in the region of 2 septal perforating takeoff vessels seem to dip  Intramyocardially' there was no evidence for muscle bridging.  The remainder of the LAD was free of significant disease and extended to the LV apex.  It gave rise to several additional septal perforating arteries and one major diagonal vessel  Left circumflex coronary artery gave rise to 3 marginal vessels.  There was mild 30 to less than 40% narrowing in the  first major obtuse 1 marginal branch.  There is mild luminal irregularity.  The distal circumflex without significant obstruction  Right coronary was a large, dominant vessel that had proximal 20-30% narrowing.  The previously placed stent just beyond the acute margin was widely patent.  There is no significant obstructive disease distally in the RCA gave rise to the PDA and then within a small continuation branch  Biplane selective RV revealed an ejection fraction of 55%.  There was mild subtle residual mid inferior hypocontractility of the RAO projection.  Contractility appeared normal in the LAO projection. .   IMPRESSION:  Normal right heart pressures without evidence for constrictive or restrictive physiology  Mild nonobstructive coronary artery disease with 10-20% narrowing in the proximal LAD proximal to a segment which seemed to dip intramyocardially, without evidence for muscle bridging; 30-40% narrowing in the circumflex marginal 1 vessel; 23% narrowing in the proximal RCA with a widely patent RCA stent beyond the acute margin.  Preserved LV contractility with an ejection fraction of 55% with mild residual subtle mid inferior hypocontractility.    Troy Sine, MD, [facility] 10/07/2013 9:05 AM

## 2013-10-07 NOTE — H&P (View-Only) (Signed)
Patient ID: Kristopher [name], male   DOB: Dec 03, 1950, 63 y.o.   MRN: [id number]       HPI: Kristopher [name] is a 63 y.o. male  is seen in the office today as an add-on following his exercise Myoview study after he developed significant chest pain and a hypertensive blood pressure response to exercise.  [name] is a 63 year old male with a history of type 2 diabetes mellitus, hypertension, atrial fibrillation who is status post ablation, as well as small chronic pericardial effusion documented for at least 7 years. He presented to [facility] on 06/27/2013 with chest pain radiating to his upper extremity and back. Cardiac markers were positive with a troponin of 7.54 compatible with NSTEMI. He underwent acute catheterization by me on 06/27/2013 and was found to have high-grade diffuse 90+ % distal RCA stenoses beyond the acute margin and proximal to the PDA takeoff  and mild 10 at 20% narrowing in the LAD with a normal circumflex vessel. EF 45-50%. He underwent successful stenting of his distal RCA and a Xience Alpine DES stent 3.0x23 mm was inserted and post dilated to 3.25 mm. He was treated with aspirin and Brilinta for dual antiplatelet therapy. His troponin peaked at 20.  Post discharge, he developed recurrent chest pain leading to readmission and he underwent repeat cardiac catheterization by Dr. Shon Hough on 07/10/13. His RCA stent was widely patent and ejection fraction was 40%.He later saw [name], PAC on 07/17/13 and his pulse was 100. His beta blocker was increased from Toprol-XL 25 to 50 mg. He has experienced some episodes of chest discomfort since that time. He presented to cardiac rehabilitation 2 weeks ago and because of his recent chest pain they did not feel comfortable having him exercise and saw me in our office on 07/29/13.   He has  a chronic peicardial effusion since at least 2008. An echo Doppler study done on 06/27/2013 revealed an ejection fraction of 40% with grade 2 diastolic  dysfunction. A small circumferential paracardial effusion with no evidence for temp and was again demonstrated.  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 He states that his wife seems to notice abnormal breathing during sleep with apparent possible Cheyne-Stokes respirations. She checked his oxygen saturation this did drop to 82%. When I saw him, I recommended that he undergo a sleep study. This has not yet been done but is scheduled for 09/01/2013.  Patient has continued to experience mild episodes of chest pressure. Oftentimes these are nonexertional. He did take sublingual nitroglycerin and felt improvement in approximately 5 minutes. He denies exertional chest pain. When I recently saw him I further titrate his Toprol to 100 mg daily. At the time of that evaluation he did have an increased pulse rate with ventricular trigeminy. He feels his pulse is more stable and he is not aware of any extra heartbeats.  When I last saw him, because of the repeat his recurrent episodes of chest pain I have recommended that he undergo an exercise Myoview study to make certain he has not developed any subsequent ischemia. He presented for this study today. His resting blood pressure was 125/81 and heart rate in the eighth. Rest nuclear images were obtained. He does size on the Bruce protocol total of 7 minutes and 24 seconds. During stage III of exercise he developed increasing shortness of breath chest tightness which he characterized as 6/10 wheezing and felt he could not go any further and therefore stopped. At that point, he  was less than 80% predicted maximal heart rate and consequently was not injected with his stress pharmaceutical dose. He had mild ST segments at rest and these increased to slightly 0.5 to less than 1.0 at peak stress. However, his blood pressure dropped to 87/50 with this episode of chest tightness.   Subtotally he underwent cardiac catheterization, which was done on 08/28/2013 per is  no evidence for restenosis of his widely patent stent in the distal right coronary artery and a previously noted 30%.  Mild narrowing in the mid RCA.  The LAD was normal in its proximal segment with mild intramyocardial dipping without evidence for systolic bridging.  He had a normal left circumflex coronary artery.  Subsequently he was again readmitted to the hospital with recurrent chest pain for days after his cardiac catheterization.  During that hospitalization he was also evaluated by Dr. Roxan Hockey in light of his pericardial effusion.  It was not felt that he was a candidate for pericardial window.  [name].  During that time.  There was discussion concerning the possibility of bringing the patient back at some point following treatment with steroids of his pericarditis to consider doing a right heart catheterization to make certain there was no evidence for constrictive/restrictive physiology.  He has continued to experience chest pain, which he states is nitrate responsive.  He has been on steroids now for one week and presents for evaluation.  Past Medical History  Diagnosis Date  . Herniated cervical disc     c2-T1 fusion  . Atrial fibrillation     a. s/p PVI X2 by Dr Omelia Blackwater at [facility] around 2000.  Marland Kitchen CAD (coronary artery disease)     a. NSTEMI (06/2013):  LHC (06/27/2013):  pLAD 10-20, RCA 90, EF 45-50%, inf HK.  PCI:  Xience Alpine (3 x 23 mm) DES to the RCA. b. Relook cath 07/10/13 - patent stent. Imdur added. c. Recurrent CP after that - abnormal nuc 07/2017 with chest pain, dyspnea, hypotensive response. Rest images with mid-basal inferior thinning. No stress images due to abrupt d/c of test. Relook cath 3/12 - no significant r  . Ischemic cardiomyopathy     a. Echo (06/28/2011): Inferior and inferoseptal akinesis, EF 38%, grade 2 diastolic dysfunction, MAC, small effusion (no tamponade). B. EF 40% by echo 06/2013, cath 07/2013, 55% by cath 08/2013 with subtle mild mid  inferior hypocontractility.  Marland Kitchen HTN (hypertension)   . HLD (hyperlipidemia)   . Pericardial effusion     a. Chronic since hx of pericarditis in 2004. b. Mod by echo 06/2013, small by echo 06/2013.  Marland Kitchen Depression   . Anginal pain   . NSTEMI (non-ST elevated myocardial infarction) 06-27-2013  . Pneumonia ~ 2005  . Type II diabetes mellitus   . Osteoarthritis     "neck and lower back" (09/01/2013)  . DJD (degenerative joint disease) of cervical spine     "3 herniated discs" (09/01/2013)  . DJD (degenerative joint disease), lumbosacral     "bulging L5-S1" (09/01/2013)  . Chronic back pain greater than 3 months duration     Past Surgical History  Procedure Laterality Date  . Posterior cervical fusion/foraminotomy  2009    fusion c3-t1  . Atrial fibrillation ablation  2000    Dr [facility]melia [facility]water at [facility]  . Appendectomy  ~ 1965  . Tonsillectomy  1959  . Pulmonary vein ablation  2001    Dr Omelia Blackwater at [place]  .  Codeine Nausea And Vomiting  . Fentanyl Nausea And Vomiting  . Other Nausea And Vomiting    1. N/V to all opiates. He is able to take versed. (noted 09/29/13) 2.The patient states that he has had  a reaction to all narcotics. He can take them though. (noted 06/26/13)    Current Outpatient Prescriptions  Medication Sig Dispense Refill  . Ascorbic Acid (VITAMIN C) 1000 MG tablet Take 1,000 mg by mouth daily.      Marland Kitchen aspirin EC 81 MG tablet Take 81 mg by mouth daily.      . B Complex-Biotin-FA (VITAMIN B50 COMPLEX PO) Take 1 capsule by mouth daily.      . baclofen (LIORESAL) 10 MG tablet Take 10 mg by mouth 3 (three) times daily as needed for muscle spasms.       Marland Kitchen buPROPion (WELLBUTRIN XL) 150 MG 24 hr tablet Take 150 mg by mouth daily.      . cholecalciferol (VITAMIN D) 1000 UNITS tablet Take 1,000 Units by mouth daily.      . cyclobenzaprine (FLEXERIL) 10 MG tablet Take 10 mg by mouth 3 (three) times daily as needed for muscle spasms.      Marland Kitchen gabapentin  (NEURONTIN) 300 MG capsule Take 300-600 mg by mouth 3 (three) times daily. Take 319m in the morning, 3075min the afternoon, and 6002mt bedtime.      . gMarland Kitchenimepiride (AMARYL) 2 MG tablet Take 2 mg by mouth 2 (two) times daily.       . iMarland Kitchenuprofen (ADVIL,MOTRIN) 200 MG tablet Take 800 mg by mouth every 6 (six) hours as needed (pain).      . insulin aspart (NOVOLOG) 100 UNIT/ML injection Inject 3-15 Units into the skin 4 (four) times daily -  with meals and at bedtime. Per sliding scale.      . metoprolol succinate (TOPROL-XL) 100 MG 24 hr tablet Take 100 mg by mouth daily. Take with or immediately following a meal.      . nitroGLYCERIN (NITROSTAT) 0.4 MG SL tablet Place 0.4 mg under the tongue every 5 (five) minutes as needed for chest pain.      . ranolazine (RANEXA) 1000 MG SR tablet Take 1 tablet (1,000 mg total) by mouth 2 (two) times daily.  60 tablet  11  . sitaGLIPtin-metformin (JANUMET) 50-500 MG per tablet Take 1 tablet by mouth 2 (two) times daily with a meal.      . clopidogrel (PLAVIX) 75 MG tablet Take 1 tablet (75 mg total) by mouth daily.  30 tablet  11  . colchicine 0.6 MG tablet Take 0.6 mg by mouth daily.      . fenofibrate 160 MG tablet Take 160 mg by mouth at bedtime.      . isosorbide mononitrate (IMDUR) 60 MG 24 hr tablet Take 30-60 mg by mouth 2 (two) times daily. Takes 1 (82m54mvery morning and 1/2 tablet (30 mg) every evening.      . predniSONE (DELTASONE) 10 MG tablet Take 10 mg by mouth daily with breakfast. X 3 more days then stop.  (Initially started with 5 tabs daily and tapered down by 1 tablet every 4 days until completed. 5 tabs x 4 days, 4 tabs x 4 days, etc.)       No current facility-administered medications for this visit.    History   Social History  . Marital Status: Married    Spouse Name: N/A    Number of Children: N/A  . Years  of Education: N/A   Occupational History  . 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  Smoking status: Former Smoker -- 1.50 packs/day for 43 years    Types: Cigarettes    Quit date: 05/06/2007  . Smokeless tobacco: Never Used  . Alcohol Use: Yes     Comment: 09/01/2013 "might have a drink a couple times/yr"  . Drug Use: No  . Sexual Activity: Not Currently   Other Topics Concern  . Not on file   Social History Narrative  . No narrative on file   Socially he is married. 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 There is no tobacco use.  Family History  Problem Relation Age of Onset  . CAD Neg Hx     No hx early CAD  . COPD Other   . Cancer Other   . Diabetes Other   . Heart disease Other     ROS is negative for fevers, chills or night sweats. He denies headaches. He denies change in vision or hearing. He denies wheezing. He does note at times while shortness of breath. He denies PND or orthopnea. He has expressed recurrent episodes of chest discomfort described as tightness. He denies nausea vomiting or diarrhea. He's aware blood in stool or urine. He denies myalgias. He denies claudication symptoms. He is diabetic. He does have hyperlipidemia.  His sleep breathing is abnormal. He does have history of depression for which he has been on Wellbutrin. Other comprehensive 14 point system review is negative.  PE BP 112/74  Pulse 77  Ht 5' 7"  (1.702 m)  Wt 182 lb 3.2 oz (82.645 kg)  BMI 28.53 kg/m2  General: Alert, oriented, no distress.  Skin: normal turgor, no rashes HEENT: Normocephalic, atraumatic. Pupils round and reactive; sclera anicteric;no lid lag. Extraocular muscles intact. No Nose without nasal septal hypertrophy Mouth/Parynx benign; Mallinpatti scale 3 Neck: No JVD, no carotid bruits; normal carotid upstroke Lungs: clear to ausculatation and percussion; no wheezing or rales Chest wall: no tenderness to palpitation Heart:  trigeminal pattern rhythm, s1 s2 normal no S3 or S4 gallop. No rub. 1/6 systolic murmur; no  friction rub.  Abdomen: soft, nontender; no hepatosplenomehaly, BS+; abdominal aorta nontender and not dilated by palpation. Back: no CVA tenderness Pulses 2+ right femoral and right radial cath sites well healed  Extremities: no clubbing cyanosis or edema, Homan's sign negative  Neurologic: grossly nonfocal; cranial nerves grossly normal. Psychologic: normal affect and mood.  ECG (independently read by me): Normal sinus rhythm at 77 beats per minute.  PR interval 146 ms; QT interval 468 ms.  Nonspecific ST-T changes.  Recent ECG of 08/07/2013  (independently read by me): Sinus rhythm at 86; no ectopy  Prior ECG of 07/29/2013 (independently read by me): Underlying sinus rhythm with ventricular trigeminy. Sinus rate 97 beats per minute. A small Q wave in lead 3.  LABS:  BMET    Component Value Date/Time   NA 137 10/02/2013 0851   K 4.1 10/02/2013 0851   CL 101 10/02/2013 0851   CO2 29 10/02/2013 0851   GLUCOSE 231* 10/02/2013 0851   BUN 18 10/02/2013 0851   CREATININE 0.90 10/02/2013 0851   CREATININE 1.33 09/03/2013 0332   CALCIUM 9.0 10/02/2013 0851   GFRNONAA 57* 09/03/2013 0332   GFRAA 66* 09/03/2013 0332     Hepatic Function Panel     Component Value Date/Time  PROT 6.4 10/02/2013 0851   ALBUMIN 4.0 10/02/2013 0851   AST 59* 10/02/2013 0851   ALT 93* 10/02/2013 0851   ALKPHOS 63 10/02/2013 0851   BILITOT 0.5 10/02/2013 0851   BILIDIR <0.2 09/01/2013 1523   IBILI NOT CALCULATED 09/01/2013 1523     CBC    Component Value Date/Time   WBC 5.5 10/02/2013 0851   RBC 3.82* 10/02/2013 0851   HGB 12.6* 10/02/2013 0851   HCT 36.1* 10/02/2013 0851   PLT 235 10/02/2013 0851   MCV 94.5 10/02/2013 0851   MCH 33.0 10/02/2013 0851   MCHC 34.9 10/02/2013 0851   RDW 14.0 10/02/2013 0851   LYMPHSABS 1.0 10/02/2013 0851   MONOABS 0.4 10/02/2013 0851   EOSABS 0.2 10/02/2013 0851   BASOSABS 0.0 10/02/2013 0851     BNP    Component Value Date/Time   PROBNP 166.2* 09/01/2013 1523    Lipid Panel       Component Value Date/Time   CHOL 175 06/27/2013 0630   TRIG 152* 08/07/2013 0857   TRIG 126 06/27/2013 0630   HDL 31* 06/27/2013 0630   CHOLHDL 5.6 06/27/2013 0630   VLDL 25 06/27/2013 0630   LDLCALC 49 08/07/2013 0857   LDLCALC 119* 06/27/2013 0630     RADIOLOGY: No results found.    ASSESSMENT AND PLAN: Mr. Huntington Leverich is a 63 year old gentleman with diabetes mellitus for at least 7 years, a history of hyperlipidemia, who suffered a  non-ST segment elevation myocardial infarction secondary to high-grade distal RCA disease which was successfully stented on 06/27/2013. Repeat catheterization for recurrent chest pain showed the stent to be widely patent. He does have mild LV dysfunction.  He has continued to experience recurrent episodes of chest pain.  We did attempt to perform any an exercise Myoview study, but he became hypotensive with stress.  Prior to being able to be injected with the stress radioisotope.  Subsequent cardiac catheterization on 08/28/2013 again, showed a widely patent RCA stent, without significant progression of disease.  He did have a possible small segment of his LAD, which did intramyocardially.  He continues to experience chest pain, which he states is nitrate responsive.  I am further titrating his isosorbide to 90 mg in the morning and 30 mg in the evening.  He has been on steroids for one week for his presumed pericarditis.  I have suggested over-the-counter Prilosec.  He did undergo valuation for possible pericardial window, but it was not felt to be indicated based on his symptoms and pericardial effusion size.  I have recommended he see lower ankle back in the office in approximately 10 days for further evaluation.  We also discussed the potential for struct of sleep apnea in light of sleep disordered breathing.  I will review his followup evaluation with Cecilie Kicks once complete, and further recommendations will be  made at that time.  Troy Sine, MD, [facility]   10/05/2013 1:07 PM

## 2013-10-07 NOTE — Interval H&P Note (Signed)
Cath Lab Visit (complete for each Cath Lab visit)  Clinical Evaluation Leading to the Procedure:   ACS: no  Non-ACS:    Anginal Classification: CCS III  Anti-ischemic medical therapy: Maximal Therapy (2 or more classes of medications)  Non-Invasive Test Results: Intermediate-risk stress test findings: cardiac mortality 1-3%/year  Prior CABG: No previous CABG      History and Physical Interval Note:  10/07/2013 7:59 AM  Kristopher [name]  has presented today for surgery, with the diagnosis of CP  The various methods of treatment have been discussed with the patient and family. After consideration of risks, benefits and other options for treatment, the patient has consented to  Procedure(s): LEFT AND RIGHT HEART CATHETERIZATION WITH CORONARY ANGIOGRAM (N/A) as a surgical intervention .  The patient's history has been reviewed, patient examined, no change in status, stable for surgery.  I have reviewed the patient's chart and labs.  Questions were answered to the patient's satisfaction.     Troy Sine

## 2013-10-08 ENCOUNTER — Ambulatory Visit (HOSPITAL_COMMUNITY): Payer: 59

## 2013-10-08 ENCOUNTER — Encounter (HOSPITAL_COMMUNITY): Payer: 59

## 2013-10-09 ENCOUNTER — Other Ambulatory Visit: Payer: Self-pay | Admitting: Physician Assistant

## 2013-10-10 ENCOUNTER — Telehealth: Payer: Self-pay | Admitting: *Deleted

## 2013-10-10 ENCOUNTER — Ambulatory Visit (HOSPITAL_COMMUNITY): Payer: 59

## 2013-10-10 ENCOUNTER — Encounter (HOSPITAL_COMMUNITY): Payer: 59

## 2013-10-10 NOTE — Telephone Encounter (Signed)
Called patient to discuss a message that he left when picking up his disability form. Patient said that Dr. Claiborne Billings mentioned that the next step of treatment would be a "nitrogen uptake medication" however there was not anything ordered. Informed patient Dr.kelly will be unavailable until  early next week. I will send Dr. Claiborne Billings a message and call him back to advise once reviewed. Patient voiced understanding asking if Cecilie Kicks would know since she did his discharge. Told the patient that she may know however she to is also unavailable. Also stated to him that Mickel Baas would more than likely defer this to Dr. Claiborne Billings for decision.

## 2013-10-10 NOTE — Telephone Encounter (Signed)
Informed patient that his disability form has been completed and ready for pick up.

## 2013-10-13 ENCOUNTER — Ambulatory Visit (HOSPITAL_COMMUNITY): Payer: 59

## 2013-10-13 ENCOUNTER — Encounter (HOSPITAL_COMMUNITY): Payer: 59

## 2013-10-14 ENCOUNTER — Other Ambulatory Visit: Payer: Self-pay | Admitting: Physician Assistant

## 2013-10-15 ENCOUNTER — Encounter (HOSPITAL_COMMUNITY): Payer: 59

## 2013-10-15 ENCOUNTER — Ambulatory Visit (HOSPITAL_COMMUNITY): Payer: 59

## 2013-10-17 ENCOUNTER — Encounter (HOSPITAL_COMMUNITY): Payer: 59

## 2013-10-17 ENCOUNTER — Ambulatory Visit (HOSPITAL_COMMUNITY): Payer: 59

## 2013-10-20 ENCOUNTER — Encounter (HOSPITAL_COMMUNITY): Payer: 59

## 2013-10-22 ENCOUNTER — Encounter: Payer: Self-pay | Admitting: Cardiovascular Disease

## 2013-10-22 ENCOUNTER — Encounter (HOSPITAL_COMMUNITY): Payer: 59

## 2013-10-24 ENCOUNTER — Encounter (HOSPITAL_COMMUNITY): Payer: 59

## 2013-10-27 ENCOUNTER — Encounter (HOSPITAL_COMMUNITY): Payer: 59

## 2013-10-29 ENCOUNTER — Encounter (HOSPITAL_COMMUNITY): Payer: 59

## 2013-10-31 ENCOUNTER — Encounter (HOSPITAL_COMMUNITY): Payer: 59

## 2013-11-03 ENCOUNTER — Telehealth: Payer: Self-pay | Admitting: Cardiovascular Disease

## 2013-11-03 NOTE — Telephone Encounter (Signed)
Patient had cath in April.  When does he need to come back in?

## 2013-11-03 NOTE — Telephone Encounter (Signed)
Contacted patient about his next visit.  Needs to come into see Dr. Claiborne Billings July or August, sooner if he has any problems.  Voiced understanding.  Message sent to scheduling.

## 2013-11-04 ENCOUNTER — Ambulatory Visit (INDEPENDENT_AMBULATORY_CARE_PROVIDER_SITE_OTHER): Payer: 59 | Admitting: Cardiovascular Disease

## 2013-11-04 VITALS — BP 117/78 | HR 84 | Ht 67.0 in | Wt 188.8 lb

## 2013-11-04 DIAGNOSIS — I319 Disease of pericardium, unspecified: Secondary | ICD-10-CM

## 2013-11-04 DIAGNOSIS — I10 Essential (primary) hypertension: Secondary | ICD-10-CM

## 2013-11-04 DIAGNOSIS — I3139 Other pericardial effusion (noninflammatory): Secondary | ICD-10-CM

## 2013-11-04 DIAGNOSIS — I2510 Atherosclerotic heart disease of native coronary artery without angina pectoris: Secondary | ICD-10-CM

## 2013-11-04 DIAGNOSIS — I313 Pericardial effusion (noninflammatory): Secondary | ICD-10-CM

## 2013-11-04 DIAGNOSIS — E785 Hyperlipidemia, unspecified: Secondary | ICD-10-CM

## 2013-11-04 DIAGNOSIS — E119 Type 2 diabetes mellitus without complications: Secondary | ICD-10-CM

## 2013-11-04 MED ORDER — LISINOPRIL 2.5 MG PO TABS
2.5000 mg | ORAL_TABLET | Freq: Every day | ORAL | Status: DC
Start: 2013-11-04 — End: 2016-01-01

## 2013-11-04 NOTE — Patient Instructions (Signed)
Your physician has recommended you make the following change in your medication: start new prescription for lisinopril 2.5 mg this has already been sent to the pharmacy. Get L- arginine over the counter. Take as directed per Dr. Evette Georges recommendations.  Your physician recommends that you schedule a follow-up appointment in: 4 months

## 2013-11-07 ENCOUNTER — Encounter: Payer: Self-pay | Admitting: Cardiovascular Disease

## 2013-11-07 NOTE — Progress Notes (Signed)
Patient ID: Kristopher [name], male   DOB: 04-09-1951, 63 y.o.   MRN: [id number]       HPI: Kristopher [name] is a 63 y.o. male  Who presents for follow-up cardiology evaluation.  Mr. Vilardi is a 63 year old male with a history of type 2 diabetes mellitus, hypertension, atrial fibrillation who is status post ablation, as well as small chronic pericardial effusion documented for at least 7 years. He presented to [facility] on 06/27/2013 with chest pain radiating to his upper extremity and back. Cardiac markers were positive with a troponin of 7.54 compatible with NSTEMI. He underwent acute catheterization by me on 06/27/2013 and was found to have high-grade diffuse 90+ % distal RCA stenoses beyond the acute margin and proximal to the PDA takeoff  and mild 10 at 20% narrowing in the LAD with a normal circumflex vessel. EF 45-50%. He underwent successful stenting of his distal RCA and a Xience Alpine DES stent 3.0x23 mm was inserted and post dilated to 3.25 mm. He was treated with aspirin and Brilinta for dual antiplatelet therapy. His troponin peaked at 20.  Post discharge, he developed recurrent chest pain leading to readmission and he underwent repeat cardiac catheterization by Dr. Shon Hough on 07/10/13. His RCA stent was widely patent and ejection fraction was 40%.He later saw [name], PAC on 07/17/13 and his pulse was 100. His beta blocker was increased from Toprol-XL 25 to 50 mg. He has experienced some episodes of chest discomfort since that time. He presented to cardiac rehabilitation 2 weeks ago and because of his recent chest pain they did not feel comfortable having him exercise and saw me in our office on 07/29/13.   He has  a chronic peicardial effusion since at least 2008. An echo Doppler study done on 06/27/2013 revealed an ejection fraction of 40% with grade 2 diastolic dysfunction. A small circumferential paracardial effusion with no evidence for temp and was again demonstrated.  Patient has  continued to experience mild episodes of chest pressure. Oftentimes these are nonexertional. He did take sublingual nitroglycerin and felt improvement in approximately 5 minutes. He denies exertional chest pain. When I recently saw him I further titrate his Toprol to 100 mg daily. At the time of that evaluation he did have an increased pulse rate with ventricular trigeminy. He feels his pulse is more stable and he is not aware of any extra heartbeats.  Because of his recurrent episodes of chest pain he underwent an exercise Myoview study to make certain he has not developed any subsequent ischemia.  His resting blood pressure was 125/81 and heart rate in the eighth. Rest nuclear images were obtained. He exercised on the Bruce protocol total of 7 minutes and 24 seconds. During stage III of exercise he developed increasing shortness of breath chest tightness which he characterized as 6/10 wheezing and felt he could not go any further and therefore stopped. At that point, he was less than 80% predicted maximal heart rate and consequently was not injected with his stress pharmaceutical dose. He had mild ST segments at rest and these increased to slightly 0.5 to less than 1.0 at peak stress. However, his blood pressure dropped to 87/50 with this episode of chest tightness.   He underwent follow-upcardiac catheterization, which was done on 08/28/2013. There was no evidence for restenosis of his widely patent stent in the distal right coronary artery and a previously noted 30% narrowing in the mid RCA.  The LAD was normal in its proximal segment  with mild intramyocardial dipping without evidence for systolic bridging.  He had a normal left circumflex coronary artery.  Subsequently he was again readmitted to the hospital with recurrent chest pain for days after his cardiac catheterization.  During that hospitalization he was also evaluated by Dr. Roxan Hockey in light of his pericardial effusion.  It was not felt that  he was a candidate for pericardial window.  [name].  During that time.  There was discussion concerning the possibility of bringing the patient back at some point following treatment with steroids of his pericarditis to consider doing a right heart catheterization to make certain there was no evidence for constrictive/restrictive physiology.  Subsequent catheterization on 10/07/13 due to continued problems of recurrent chest pain did not reveal any evidence for constrictive or restrictive physiology.  His coronary anatomy was unchanged from his prior catheterization.  He has seen his primary physician, Dr. Irineo Axon.  Some of his medications have been reduced in the event her to low blood pressure.  He now states he has continued to experience some chest pain, and the longest interval without taking nitroglycerin typically, is around 3 days.  He denies any prolonged episodes of discomfort.  Overall, he does feel improved.  He's not yet returned to work.  He presents for evaluation.  Past Medical History  Diagnosis Date  . Herniated cervical disc     c2-T1 fusion  . Atrial fibrillation     a. s/p PVI X2 by Dr Omelia Blackwater at [facility] around 2000.  Marland Kitchen CAD (coronary artery disease)     a. NSTEMI (06/2013):  LHC (06/27/2013):  pLAD 10-20, RCA 90, EF 45-50%, inf HK.  PCI:  Xience Alpine (3 x 23 mm) DES to the RCA. b. Relook cath 07/10/13 - patent stent. Imdur added. c. Recurrent CP after that - abnormal nuc 07/2017 with chest pain, dyspnea, hypotensive response. Rest images with mid-basal inferior thinning. No stress images due to abrupt d/c of test. Relook cath 3/12 - no significant r  . Ischemic cardiomyopathy     a. Echo (06/28/2011): Inferior and inferoseptal akinesis, EF 66%, grade 2 diastolic dysfunction, MAC, small effusion (no tamponade). B. EF 40% by echo 06/2013, cath 07/2013, 55% by cath 08/2013 with subtle mild mid inferior hypocontractility.  Marland Kitchen HTN (hypertension)   . HLD (hyperlipidemia)     . Pericardial effusion     a. Chronic since hx of pericarditis in 2004. b. Mod by echo 06/2013, small by echo 06/2013.  Marland Kitchen Depression   . Anginal pain   . NSTEMI (non-ST elevated myocardial infarction) 06-27-2013  . Pneumonia ~ 2005  . Type II diabetes mellitus   . Osteoarthritis     "neck and lower back" (09/01/2013)  . DJD (degenerative joint disease) of cervical spine     "3 herniated discs" (09/01/2013)  . DJD (degenerative joint disease), lumbosacral     "bulging L5-S1" (09/01/2013)  . Chronic back pain greater than 3 months duration     Past Surgical History  Procedure Laterality Date  . Posterior cervical fusion/foraminotomy  2009    fusion c3-t1  . Atrial fibrillation ablation  2000    Dr Omelia Blackwater at [facility]  . Appendectomy  ~ 1965  . Tonsillectomy  1959  . Pulmonary vein ablation  2001    Dr Omelia Blackwater at [place] [facility]  . Codeine Nausea And Vomiting  . Fentanyl Nausea And Vomiting  . Other Nausea And Vomiting  1. N/V to all opiates. He is able to take versed. (noted 09/29/13) 2.The patient states that he has had  a reaction to all narcotics. He can take them though. (noted 06/26/13)    Current Outpatient Prescriptions  Medication Sig Dispense Refill  . amLODipine (NORVASC) 2.5 MG tablet Take 2.5 mg by mouth daily.      . Ascorbic Acid (VITAMIN C) 1000 MG tablet Take 1,000 mg by mouth daily.      Marland Kitchen aspirin EC 81 MG tablet Take 81 mg by mouth daily.      . B Complex-Biotin-FA (VITAMIN B50 COMPLEX PO) Take 1 capsule by mouth daily.      . baclofen (LIORESAL) 10 MG tablet Take 10 mg by mouth 3 (three) times daily as needed for muscle spasms.       Marland Kitchen buPROPion (WELLBUTRIN XL) 150 MG 24 hr tablet Take 150 mg by mouth daily.      . cholecalciferol (VITAMIN D) 1000 UNITS tablet Take 1,000 Units by mouth daily.      . clopidogrel (PLAVIX) 75 MG tablet Take 1 tablet (75 mg total) by mouth daily.  30 tablet  11  . colchicine 0.6 MG tablet Take 0.6 mg by mouth  daily.      . cyclobenzaprine (FLEXERIL) 10 MG tablet Take 10 mg by mouth 3 (three) times daily as needed for muscle spasms.      . fenofibrate 160 MG tablet Take 160 mg by mouth at bedtime.      . gabapentin (NEURONTIN) 300 MG capsule Take 300-600 mg by mouth 3 (three) times daily. Take 311m in the morning, 3089min the afternoon, and 60065mt bedtime.      . gMarland Kitchenimepiride (AMARYL) 2 MG tablet Take 2 mg by mouth 2 (two) times daily.       . iMarland Kitchenuprofen (ADVIL,MOTRIN) 200 MG tablet Take 800 mg by mouth every 6 (six) hours as needed (pain).      . insulin aspart (NOVOLOG) 100 UNIT/ML injection Inject 3-15 Units into the skin 4 (four) times daily -  with meals and at bedtime. Per sliding scale.      . insulin detemir (LEVEMIR) 100 UNIT/ML injection Inject 10 Units into the skin at bedtime.      . isosorbide mononitrate (IMDUR) 60 MG 24 hr tablet Take 30-60 mg by mouth 2 (two) times daily. Takes 1 (33m1mvery morning and 1/2 tablet (30 mg) every evening.      . metoprolol succinate (TOPROL-XL) 100 MG 24 hr tablet Take 100 mg by mouth daily. Take with or immediately following a meal.      . nitroGLYCERIN (NITROSTAT) 0.4 MG SL tablet Place 0.4 mg under the tongue every 5 (five) minutes as needed for chest pain.      . ranolazine (RANEXA) 1000 MG SR tablet Take 1 tablet (1,000 mg total) by mouth 2 (two) times daily.  60 tablet  11  . sitaGLIPtin-metformin (JANUMET) 50-500 MG per tablet Take 1 tablet by mouth 2 (two) times daily with a meal.      . lisinopril (PRINIVIL,ZESTRIL) 2.5 MG tablet Take 1 tablet (2.5 mg total) by mouth daily.  30 tablet  6   No current facility-administered medications for this visit.    History   Social History  . Marital Status: Married    Spouse Name: N/A    Number of Children: N/A  . Years of Education: N/A   Occupational History  . RESP  Resp Therapist at [employer]  . Smoking status: Former Smoker -- 1.50 packs/day for 43  years    Types: Cigarettes    Quit date: 05/06/2007  . Smokeless tobacco: Never Used  . Alcohol Use: Yes     Comment: 09/01/2013 "might have a drink a couple times/yr"  . Drug Use: No  . Sexual Activity: Not Currently   Other Topics Concern  . Not on file   Social History Narrative  . No narrative on file   Socially he is married. 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 There is no tobacco use.  Family History  Problem Relation Age of Onset  . CAD Neg Hx     No hx early CAD  . COPD Other   . Cancer Other   . Diabetes Other   . Heart disease Other    ROS General: Negative; No fevers, chills, or night sweats;  HEENT: Negative; No changes in vision or hearing, sinus congestion, difficulty swallowing Pulmonary: Negative; No cough, wheezing, shortness of breath, hemoptysis Cardiovascular: see HPI: No presyncope, syncope, palpatations GI: Negative; No nausea, vomiting, diarrhea, or abdominal pain GU: Negative; No dysuria, hematuria, or difficulty voiding Musculoskeletal: Negative; no myalgias, joint pain, or weakness Hematologic/Oncology: Negative; no easy bruising, bleeding Endocrine: Positive for diabetes mellitus no heat/cold intolerance;  Neuro: Negative; no changes in balance, headaches Skin: Negative; No rashes or skin lesions Psychiatric: History of depression No behavioral problems,  Sleep: Negative; No snoring, daytime sleepiness, hypersomnolence, bruxism, restless legs, hypnogognic hallucinations, no cataplexy Other comprehensive 14 point system review is negative.   PE BP 117/78  Pulse 84  Ht 5' 7"  (1.702 m)  Wt 188 lb 12.8 oz (85.639 kg)  BMI 29.56 kg/m2  Repeat blood pressure by me was 120/80, supine, and was 112/80 standing.  There was no significant orthostatic pulse rise. General: Alert, oriented, no distress.  Skin: normal turgor, no rashes HEENT: Normocephalic, atraumatic. Pupils round and reactive; sclera  anicteric;no lid lag. Extraocular muscles intact. No Nose without nasal septal hypertrophy Mouth/Parynx benign; Mallinpatti scale 3 Neck: No JVD, no carotid bruits; normal carotid upstroke Lungs: clear to ausculatation and percussion; no wheezing or rales Chest wall: no tenderness to palpitation Heart:  trigeminal pattern rhythm, s1 s2 normal no S3 or S4 gallop. No rub. 1/6 systolic murmur; no friction rub.  Abdomen: soft, nontender; no hepatosplenomehaly, BS+; abdominal aorta nontender and not dilated by palpation. Back: no CVA tenderness Pulses 2+ right femoral and right radial cath sites well healed  Extremities: no clubbing cyanosis or edema, Homan's sign negative  Neurologic: grossly nonfocal; cranial nerves grossly normal. Psychologic: normal affect and mood.  ECG (independently read by me): Sinus rhythm 84 beats per minute.  No ST segment changes.  QTc interval 456 ms.  Prior 09/11/2013 ECG (independently read by me): Normal sinus rhythm at 77 beats per minute.  PR interval 146 ms; QT interval 468 ms.  Nonspecific ST-T changes.  Recent ECG of 08/07/2013  (independently read by me): Sinus rhythm at 86; no ectopy  Prior ECG of 07/29/2013 (independently read by me): Underlying sinus rhythm with ventricular trigeminy. Sinus rate 97 beats per minute. A small Q wave in lead 3.  LABS:  BMET    Component Value Date/Time   NA 137 10/02/2013 0851   K 4.1 10/02/2013 0851   CL 101 10/02/2013 0851   CO2 29 10/02/2013 0851   GLUCOSE 231* 10/02/2013 9798  BUN 18 10/02/2013 0851   CREATININE 0.90 10/02/2013 0851   CREATININE 1.33 09/03/2013 0332   CALCIUM 9.0 10/02/2013 0851   GFRNONAA 57* 09/03/2013 0332   GFRAA 66* 09/03/2013 0332     Hepatic Function Panel     Component Value Date/Time   PROT 6.4 10/02/2013 0851   ALBUMIN 4.0 10/02/2013 0851   AST 59* 10/02/2013 0851   ALT 93* 10/02/2013 0851   ALKPHOS 63 10/02/2013 0851   BILITOT 0.5 10/02/2013 0851   BILIDIR <0.2 09/01/2013 1523   IBILI  NOT CALCULATED 09/01/2013 1523     CBC    Component Value Date/Time   WBC 5.5 10/02/2013 0851   RBC 3.82* 10/02/2013 0851   HGB 12.6* 10/02/2013 0851   HCT 36.1* 10/02/2013 0851   PLT 235 10/02/2013 0851   MCV 94.5 10/02/2013 0851   MCH 33.0 10/02/2013 0851   MCHC 34.9 10/02/2013 0851   RDW 14.0 10/02/2013 0851   LYMPHSABS 1.0 10/02/2013 0851   MONOABS 0.4 10/02/2013 0851   EOSABS 0.2 10/02/2013 0851   BASOSABS 0.0 10/02/2013 0851     BNP    Component Value Date/Time   PROBNP 166.2* 09/01/2013 1523    Lipid Panel     Component Value Date/Time   CHOL 175 06/27/2013 0630   TRIG 152* 08/07/2013 0857   TRIG 126 06/27/2013 0630   HDL 31* 06/27/2013 0630   CHOLHDL 5.6 06/27/2013 0630   VLDL 25 06/27/2013 0630   LDLCALC 49 08/07/2013 0857   LDLCALC 119* 06/27/2013 0630     RADIOLOGY: No results found.    ASSESSMENT AND PLAN: Mr. Jarae Panas is a 63 year old gentleman with diabetes mellitus for at least 7 years, a history of hyperlipidemia, who suffered a  non-ST segment elevation myocardial infarction secondary to high-grade distal RCA disease which was successfully stented on 06/27/2013. Repeat catheterizations for recurrent chest pain have demonstrated a widely patent stent and no additional progression of mild concomitant CAD. He does have a portion systolic muscular bridging.of his mid LAD, which dips intramyocardially, however, there was no evidence for any systolic muscular bridging.  In addition, a right heart catheterization that did not reveal any evidence for constrictive or restrictive physiology.  I am electing to add a very low dose ACE inhibitor with lisinopril 2.5 mg in attempt to improve endothelial function with improved vasodilatory capacity by nitroglycerin oxide mechanism.  I also have suggested the addition of L. arginine to his medical regimen to see if this potentially can improve some of his symptomatology as well.  His ECG today is stable.  I will see him in 3-4 months for  followup evaluation or sooner if problems arise.   Troy Sine, MD, [facility]  11/07/2013 8:33 AM

## 2013-11-18 ENCOUNTER — Ambulatory Visit (HOSPITAL_BASED_OUTPATIENT_CLINIC_OR_DEPARTMENT_OTHER): Payer: 59 | Attending: Cardiovascular Disease | Admitting: Radiology

## 2013-11-18 VITALS — Ht 67.0 in | Wt 188.0 lb

## 2013-11-18 DIAGNOSIS — R0683 Snoring: Secondary | ICD-10-CM

## 2013-11-18 DIAGNOSIS — G4733 Obstructive sleep apnea (adult) (pediatric): Secondary | ICD-10-CM

## 2013-11-18 DIAGNOSIS — I4949 Other premature depolarization: Secondary | ICD-10-CM | POA: Insufficient documentation

## 2013-11-18 DIAGNOSIS — G8929 Other chronic pain: Secondary | ICD-10-CM | POA: Insufficient documentation

## 2013-11-18 DIAGNOSIS — R0609 Other forms of dyspnea: Secondary | ICD-10-CM | POA: Insufficient documentation

## 2013-11-18 DIAGNOSIS — G4710 Hypersomnia, unspecified: Secondary | ICD-10-CM | POA: Insufficient documentation

## 2013-11-18 DIAGNOSIS — R0989 Other specified symptoms and signs involving the circulatory and respiratory systems: Secondary | ICD-10-CM | POA: Insufficient documentation

## 2013-11-18 DIAGNOSIS — G4730 Sleep apnea, unspecified: Principal | ICD-10-CM

## 2013-11-18 DIAGNOSIS — G4761 Periodic limb movement disorder: Secondary | ICD-10-CM | POA: Insufficient documentation

## 2013-11-27 DIAGNOSIS — G4730 Sleep apnea, unspecified: Secondary | ICD-10-CM

## 2013-11-27 DIAGNOSIS — G4710 Hypersomnia, unspecified: Secondary | ICD-10-CM

## 2013-11-27 NOTE — Sleep Study (Signed)
   NAME: Kristopher [name] DATE OF BIRTH:  1950-06-22 MEDICAL RECORD NUMBER [id number]  LOCATION: [facility]  PHYSICIAN: Kathee Delton  DATE OF STUDY: 11/18/2013  SLEEP STUDY TYPE: Nocturnal Polysomnogram               REFERRING PHYSICIAN: Troy Sine, MD  INDICATION FOR STUDY: Hypersomnia with sleep apnea  EPWORTH SLEEPINESS SCORE:  4 HEIGHT: 5' 7"  (170.2 cm)  WEIGHT: 188 lb (85.276 kg)    Body mass index is 29.44 kg/(m^2).  NECK SIZE: 17 in.  MEDICATIONS: Reviewed in the sleep record  SLEEP ARCHITECTURE: The patient had a total sleep time of 370 minutes, with very little slow-wave sleep and only 49 minutes of REM. Sleep onset latency was normal at 2.5 minutes, and REM onset was normal at 88 minutes. Sleep efficiency was fairly normal at 91%.  RESPIRATORY DATA: The patient was found to have 6 apneas and 31 obstructive hypopneas, giving him an AHI of only 6 events per hour. The events occurred in all body positions, and there was moderate snoring noted throughout. The patient did not meet split-night protocol secondary to his small numbers of events.  OXYGEN DATA: The patient had oxygen desaturation transiently as low as 82%.  CARDIAC DATA: Rare PVC noted  MOVEMENT/PARASOMNIA: The patient was noted to have 410 limb movements, with 3 per hour resulting in arousal or awakening. Is unclear whether this is related to the patient's sleep disordered breathing, chronic pain, or whether he may have a concomitant primary movement disorder of sleep. Clinical correlation is suggested.  IMPRESSION/ RECOMMENDATION:    1) minimal obstructive sleep apnea/hypopnea syndrome, with an AHI of 6 events per hour and transient oxygen desaturation as low as 82%. Treatment for this degree of sleep apnea can include a trial of weight loss alone, upper airway surgery, dental appliance, and also CPAP. Clinical correlation is suggested.  2) rare PVC noted, but no clinically significant  arrhythmias were seen  3) large numbers of limb movements noted during the night with 3 per hour resulting in arousal or awakening. Clinical correlation is suggested to consider a primary movement disorder of sleep.     [place], American Board of Sleep Medicine  ELECTRONICALLY SIGNED ON:  11/27/2013, 2:50 PM [place] PH: [phone]   FX: [phone] [place]

## 2013-12-20 ENCOUNTER — Emergency Department (HOSPITAL_COMMUNITY)
Admission: EM | Admit: 2013-12-20 | Discharge: 2013-12-20 | Disposition: A | Discharge status: Home / Self Care | Payer: 59 | Attending: Emergency Medicine | Admitting: Emergency Medicine

## 2013-12-20 ENCOUNTER — Emergency Department (HOSPITAL_COMMUNITY): Payer: 59

## 2013-12-20 ENCOUNTER — Encounter (HOSPITAL_COMMUNITY): Payer: Self-pay | Admitting: Emergency Medicine

## 2013-12-20 DIAGNOSIS — Z7982 Long term (current) use of aspirin: Secondary | ICD-10-CM | POA: Insufficient documentation

## 2013-12-20 DIAGNOSIS — Z9861 Coronary angioplasty status: Secondary | ICD-10-CM | POA: Insufficient documentation

## 2013-12-20 DIAGNOSIS — I252 Old myocardial infarction: Secondary | ICD-10-CM | POA: Insufficient documentation

## 2013-12-20 DIAGNOSIS — I4891 Unspecified atrial fibrillation: Secondary | ICD-10-CM | POA: Insufficient documentation

## 2013-12-20 DIAGNOSIS — Z8701 Personal history of pneumonia (recurrent): Secondary | ICD-10-CM | POA: Insufficient documentation

## 2013-12-20 DIAGNOSIS — G8929 Other chronic pain: Secondary | ICD-10-CM | POA: Insufficient documentation

## 2013-12-20 DIAGNOSIS — F329 Major depressive disorder, single episode, unspecified: Secondary | ICD-10-CM | POA: Insufficient documentation

## 2013-12-20 DIAGNOSIS — E119 Type 2 diabetes mellitus without complications: Secondary | ICD-10-CM | POA: Insufficient documentation

## 2013-12-20 DIAGNOSIS — Z79899 Other long term (current) drug therapy: Secondary | ICD-10-CM | POA: Insufficient documentation

## 2013-12-20 DIAGNOSIS — Z87891 Personal history of nicotine dependence: Secondary | ICD-10-CM | POA: Insufficient documentation

## 2013-12-20 DIAGNOSIS — Z862 Personal history of diseases of the blood and blood-forming organs and certain disorders involving the immune mechanism: Secondary | ICD-10-CM | POA: Insufficient documentation

## 2013-12-20 DIAGNOSIS — I2510 Atherosclerotic heart disease of native coronary artery without angina pectoris: Secondary | ICD-10-CM | POA: Insufficient documentation

## 2013-12-20 DIAGNOSIS — Z8639 Personal history of other endocrine, nutritional and metabolic disease: Secondary | ICD-10-CM | POA: Insufficient documentation

## 2013-12-20 DIAGNOSIS — R55 Syncope and collapse: Secondary | ICD-10-CM | POA: Insufficient documentation

## 2013-12-20 DIAGNOSIS — F3289 Other specified depressive episodes: Secondary | ICD-10-CM | POA: Insufficient documentation

## 2013-12-20 DIAGNOSIS — I10 Essential (primary) hypertension: Secondary | ICD-10-CM | POA: Insufficient documentation

## 2013-12-20 DIAGNOSIS — M1990 Unspecified osteoarthritis, unspecified site: Secondary | ICD-10-CM | POA: Insufficient documentation

## 2013-12-20 LAB — COMPREHENSIVE METABOLIC PANEL
ALBUMIN: 3.9 g/dL (ref 3.5–5.2)
ALT: 71 U/L — ABNORMAL HIGH (ref 0–53)
AST: 50 U/L — ABNORMAL HIGH (ref 0–37)
Alkaline Phosphatase: 54 U/L (ref 39–117)
Anion gap: 14 (ref 5–15)
BUN: 24 mg/dL — ABNORMAL HIGH (ref 6–23)
CO2: 26 mEq/L (ref 19–32)
CREATININE: 1.3 mg/dL (ref 0.50–1.35)
Calcium: 9 mg/dL (ref 8.4–10.5)
Chloride: 102 mEq/L (ref 96–112)
GFR calc Af Amer: 67 mL/min — ABNORMAL LOW (ref >90)
GFR calc non Af Amer: 58 mL/min — ABNORMAL LOW (ref >90)
Glucose, Bld: 151 mg/dL — ABNORMAL HIGH (ref 70–99)
Potassium: 4.2 mEq/L (ref 3.7–5.3)
Sodium: 142 mEq/L (ref 137–147)
TOTAL PROTEIN: 6.6 g/dL (ref 6.0–8.3)
Total Bilirubin: 0.5 mg/dL (ref 0.3–1.2)

## 2013-12-20 LAB — CBC WITH DIFFERENTIAL/PLATELET
BASOS ABS: 0 10*3/uL (ref 0.0–0.1)
BASOS PCT: 0 % (ref 0–1)
Eosinophils Absolute: 0.1 10*3/uL (ref 0.0–0.7)
Eosinophils Relative: 2 % (ref 0–5)
HCT: 34.1 % — ABNORMAL LOW (ref 39.0–52.0)
Hemoglobin: 12 g/dL — ABNORMAL LOW (ref 13.0–17.0)
Lymphocytes Relative: 20 % (ref 12–46)
Lymphs Abs: 1 10*3/uL (ref 0.7–4.0)
MCH: 33.5 pg (ref 26.0–34.0)
MCHC: 35.2 g/dL (ref 30.0–36.0)
MCV: 95.3 fL (ref 78.0–100.0)
MONO ABS: 0.3 10*3/uL (ref 0.1–1.0)
Monocytes Relative: 7 % (ref 3–12)
Neutro Abs: 3.6 10*3/uL (ref 1.7–7.7)
Neutrophils Relative %: 71 % (ref 43–77)
Platelets: 191 10*3/uL (ref 150–400)
RBC: 3.58 MIL/uL — ABNORMAL LOW (ref 4.22–5.81)
RDW: 13.5 % (ref 11.5–15.5)
WBC: 5.1 10*3/uL (ref 4.0–10.5)

## 2013-12-20 LAB — I-STAT TROPONIN, ED: TROPONIN I, POC: 0 ng/mL (ref 0.00–0.08)

## 2013-12-20 MED ORDER — SODIUM CHLORIDE 0.9 % IV BOLUS (SEPSIS)
1000.0000 mL | Freq: Once | INTRAVENOUS | Status: AC
  Administered 2013-12-20: 1000 mL via INTRAVENOUS

## 2013-12-20 NOTE — ED Notes (Signed)
Per EMS, patient had a near syncopal episode this afternoon. Became dizzy, lightheaded, and diaphoretic. Initial BP 73 systolic. Given 200 cc fluid. Upon arrival BP 102/60. Pt. Denied chest pain. Hx of MI and is diabetic. CBG by EMS 126

## 2013-12-20 NOTE — Discharge Instructions (Signed)
Follow up with your md next week.  Return if  problems

## 2013-12-20 NOTE — ED Notes (Signed)
Pt was standing at the grocery store when he became very dizzy and diaphoretic and had to sit down before he passed out.  Pt's symptoms have since resolved.  He denies any dizziness, denies CP, denies SOB, denies headache and is neurologically stable.

## 2013-12-20 NOTE — ED Provider Notes (Addendum)
CSN: [id number]     Arrival date & time 12/20/13  1654 History   First MD Initiated Contact with Patient 12/20/13 1655     Chief Complaint  Patient presents with  . Near Syncope     (Consider location/radiation/quality/duration/timing/severity/associated sxs/prior Treatment) Patient is a 63 y.o. male presenting with near-syncope. The history is provided by the patient (pt complains of syncope today.   pt states he had some dizziness).  Near Syncope This is a new problem. The current episode started 3 to 5 hours ago. The problem occurs rarely. The problem has been resolved. Pertinent negatives include no chest pain, no abdominal pain and no headaches. Nothing aggravates the symptoms. Nothing relieves the symptoms.    Past Medical History  Diagnosis Date  . Herniated cervical disc     c2-T1 fusion  . Atrial fibrillation     a. s/p PVI X2 by Dr Omelia Blackwater at [facility] around 2000.  Marland Kitchen CAD (coronary artery disease)     a. NSTEMI (06/2013):  LHC (06/27/2013):  pLAD 10-20, RCA 90, EF 45-50%, inf HK.  PCI:  Xience Alpine (3 x 23 mm) DES to the RCA. b. Relook cath 07/10/13 - patent stent. Imdur added. c. Recurrent CP after that - abnormal nuc 07/2017 with chest pain, dyspnea, hypotensive response. Rest images with mid-basal inferior thinning. No stress images due to abrupt d/c of test. Relook cath 3/12 - no significant r  . Ischemic cardiomyopathy     a. Echo (06/28/2011): Inferior and inferoseptal akinesis, EF 65%, grade 2 diastolic dysfunction, MAC, small effusion (no tamponade). B. EF 40% by echo 06/2013, cath 07/2013, 55% by cath 08/2013 with subtle mild mid inferior hypocontractility.  Marland Kitchen HTN (hypertension)   . HLD (hyperlipidemia)   . Pericardial effusion     a. Chronic since hx of pericarditis in 2004. b. Mod by echo 06/2013, small by echo 06/2013.  Marland Kitchen Depression   . Anginal pain   . NSTEMI (non-ST elevated myocardial infarction) 06-27-2013  . Pneumonia ~ 2005  . Type II diabetes mellitus   . Osteoarthritis      "neck and lower back" (09/01/2013)  . DJD (degenerative joint disease) of cervical spine     "3 herniated discs" (09/01/2013)  . DJD (degenerative joint disease), lumbosacral     "bulging L5-S1" (09/01/2013)  . Chronic back pain greater than 3 months duration    Past Surgical History  Procedure Laterality Date  . Posterior cervical fusion/foraminotomy  2009    fusion c3-t1  . Atrial fibrillation ablation  2000    Dr Omelia Blackwater at [facility]  . Appendectomy  ~ 1965  . Tonsillectomy  1959  . Pulmonary vein ablation  2001    Dr [facility]melia Blackwater at [facility] History  Problem Relation Age of Onset  . CAD Neg Hx     No hx early CAD  . COPD Other   . Cancer Other   . Diabetes Other   . Heart disease Other    History  Substance Use Topics  . Smoking status: Former Smoker -- 1.50 packs/day for 43 years    Types: Cigarettes    Quit date: 05/06/2007  . Smokeless tobacco: Never Used  . Alcohol Use: Yes     Comment: 09/01/2013 "might have a drink a couple times/yr"    Review of Systems  Constitutional: Negative for appetite change and fatigue.  HENT: Negative for congestion, ear discharge and sinus pressure.   Eyes: Negative for discharge.  Respiratory: Negative for cough.  Cardiovascular: Positive for near-syncope. Negative for chest pain.  Gastrointestinal: Negative for abdominal pain and diarrhea.  Genitourinary: Negative for frequency and hematuria.  Musculoskeletal: Negative for back pain.  Skin: Negative for rash.  Neurological: Negative for seizures and headaches.  Psychiatric/Behavioral: Negative for hallucinations.      Allergies  Codeine; Fentanyl; Naproxen; and Other  Home Medications   Prior to Admission medications   Medication Sig Start Date End Date Taking? Authorizing Provider  Ascorbic Acid (VITAMIN C) 1000 MG tablet Take 1,000 mg by mouth daily.   Yes Historical Provider, MD  aspirin EC 81 MG tablet Take 81 mg by mouth daily.   Yes Historical Provider, MD  B  Complex-Biotin-FA (VITAMIN B50 COMPLEX PO) Take 1 capsule by mouth daily.   Yes Historical Provider, MD  baclofen (LIORESAL) 10 MG tablet Take 10 mg by mouth 3 (three) times daily as needed for muscle spasms.    Yes Historical Provider, MD  buPROPion (WELLBUTRIN XL) 150 MG 24 hr tablet Take 150 mg by mouth daily.   Yes Historical Provider, MD  clopidogrel (PLAVIX) 75 MG tablet Take 1 tablet (75 mg total) by mouth daily. 09/25/13  Yes Cecilie Kicks, NP  colchicine 0.6 MG tablet Take 0.6 mg by mouth daily. 09/04/13  Yes Rhonda G Barrett, PA-C  cyclobenzaprine (FLEXERIL) 10 MG tablet Take 10 mg by mouth 3 (three) times daily as needed for muscle spasms.   Yes Historical Provider, MD  diltiazem (CARDIZEM CD) 120 MG 24 hr capsule Take 120 mg by mouth daily.   Yes Historical Provider, MD  fenofibrate 160 MG tablet Take 160 mg by mouth at bedtime.   Yes Historical Provider, MD  gabapentin (NEURONTIN) 300 MG capsule Take 300-600 mg by mouth 3 (three) times daily. Take 665m in the morning, 302min the afternoon, and 60074mt bedtime.   Yes Historical Provider, MD  glimepiride (AMARYL) 2 MG tablet Take 2 mg by mouth 2 (two) times daily.    Yes Historical Provider, MD  ibuprofen (ADVIL,MOTRIN) 200 MG tablet Take 800 mg by mouth every 6 (six) hours as needed (pain).   Yes Historical Provider, MD  insulin detemir (LEVEMIR) 100 UNIT/ML injection Inject 22 Units into the skin at bedtime.    Yes Historical Provider, MD  lisinopril (PRINIVIL,ZESTRIL) 2.5 MG tablet Take 1 tablet (2.5 mg total) by mouth daily. 11/04/13  Yes ThoTroy SineD  nitroGLYCERIN (NITROSTAT) 0.4 MG SL tablet Place 0.4 mg under the tongue every 5 (five) minutes as needed for chest pain.   Yes Historical Provider, MD  sitaGLIPtin-metformin (JANUMET) 50-500 MG per tablet Take 1 tablet by mouth 2 (two) times daily with a meal.   Yes Historical Provider, MD   BP 109/63  Pulse 93  Temp(Src) 97.8 F (36.6 C) (Oral)  Resp 15  Ht 5' 7"  (1.702 m)   Wt 180 lb (81.647 kg)  BMI 28.19 kg/m2  SpO2 94% Physical Exam  Constitutional: He is oriented to person, place, and time. He appears well-developed.  HENT:  Head: Normocephalic.  Eyes: Conjunctivae and EOM are normal. No scleral icterus.  Neck: Neck supple. No thyromegaly present.  Cardiovascular: Normal rate and regular rhythm.  Exam reveals no gallop and no friction rub.   No murmur heard. Pulmonary/Chest: No stridor. He has no wheezes. He has no rales. He exhibits no tenderness.  Abdominal: He exhibits no distension. There is no tenderness. There is no rebound.  Musculoskeletal: Normal range of motion. He exhibits no edema.  Lymphadenopathy:    He has no cervical adenopathy.  Neurological: He is oriented to person, place, and time. He exhibits normal muscle tone. Coordination normal.  Skin: No rash noted. No erythema.  Psychiatric: He has a normal mood and affect. His behavior is normal.    ED Course  Procedures (including critical care time) Labs Review Labs Reviewed  CBC WITH DIFFERENTIAL - Abnormal; Notable for the following:    RBC 3.58 (*)    Hemoglobin 12.0 (*)    HCT 34.1 (*)    All other components within normal limits  COMPREHENSIVE METABOLIC PANEL - Abnormal; Notable for the following:    Glucose, Bld 151 (*)    BUN 24 (*)    AST 50 (*)    ALT 71 (*)    GFR calc non Af Amer 58 (*)    GFR calc Af Amer 67 (*)    All other components within normal limits  I-STAT TROPOININ, ED    Imaging Review Dg Chest Portable 1 View  12/20/2013   CLINICAL DATA:  Near syncope with dizziness dizziness; history of diabetes and pericardial effusion.  EXAM: PORTABLE CHEST - 1 VIEW  COMPARISON:  PA and lateral chest x-ray of September 01, 2013  FINDINGS: The lungs are well-expanded and clear. The cardiac silhouette is mildly enlarged. The pulmonary vascularity is not engorged. The suspected pulmonary nodule projecting over the posterior aspect of the right ninth rib is not demonstrated  but the area is partially obscured by overlying cardiac monitoring leads. The mediastinum is normal in width. There is no pleural effusion. The observed portions of the bony thorax are unremarkable.  IMPRESSION: There is no definite evidence of active cardiopulmonary disease. A follow-up film with out the cardiac monitoring electrodes projecting over the lower hemi thorax would be useful to exclude the previously described nodule.   Electronically Signed   By: David  Martinique   On: 12/20/2013 17:18     EKG Interpretation None      Date: 12/20/2013  Rate:95  Rhythm: normal sinus rhythm  QRS Axis:nl  Intervals: nl   ST/T Wave abnormalities: nonspecific st changesl  Conduction Disutrbances: nl  Narrative Interpretation: non specific st changes  Old EKG Reviewed:   MDM   Final diagnoses:  Vasovagal syncope    Syncope,  Dehydration.  Pt to follow up with pcp this week.    Maudry Diego, MD 12/20/13 1906  Maudry Diego, MD 12/20/13 [phone]

## 2013-12-20 NOTE — ED Notes (Signed)
Pt verbalizes understanding of d/c instructions and denies any further needs at this time. 

## 2014-01-19 ENCOUNTER — Encounter: Payer: 59 | Attending: Internal Medicine

## 2014-02-09 ENCOUNTER — Telehealth: Payer: Self-pay | Admitting: Cardiovascular Disease

## 2014-02-09 NOTE — Telephone Encounter (Signed)
Received Physicians Statement ([employer]) package.  Sent to [facility] @ [place] 02/09/14 lp

## 2014-02-16 NOTE — Telephone Encounter (Signed)
Deferred to [facility] for review and signature.

## 2014-02-16 NOTE — Telephone Encounter (Signed)
8.31.15 Received [place] Form from [facility] @ [place]  Given to Barry Brunner to have Dr Claiborne Billings sign and return.

## 2014-03-04 ENCOUNTER — Telehealth: Payer: Self-pay | Admitting: Cardiovascular Disease

## 2014-03-04 NOTE — Telephone Encounter (Signed)
Received [place] form signed by Dr Claiborne Billings 02/18/14  Patient picked up form on 02/19/14 lp

## 2014-03-12 ENCOUNTER — Encounter: Payer: Self-pay | Admitting: Cardiovascular Disease

## 2014-03-12 ENCOUNTER — Ambulatory Visit (INDEPENDENT_AMBULATORY_CARE_PROVIDER_SITE_OTHER): Payer: 59 | Admitting: Cardiovascular Disease

## 2014-03-12 VITALS — BP 110/62 | HR 102 | Ht 67.0 in | Wt 190.1 lb

## 2014-03-12 DIAGNOSIS — I4949 Other premature depolarization: Secondary | ICD-10-CM

## 2014-03-12 DIAGNOSIS — I493 Ventricular premature depolarization: Secondary | ICD-10-CM

## 2014-03-12 DIAGNOSIS — I25118 Atherosclerotic heart disease of native coronary artery with other forms of angina pectoris: Secondary | ICD-10-CM

## 2014-03-12 DIAGNOSIS — I200 Unstable angina: Secondary | ICD-10-CM

## 2014-03-12 DIAGNOSIS — I2589 Other forms of chronic ischemic heart disease: Secondary | ICD-10-CM

## 2014-03-12 DIAGNOSIS — I3139 Other pericardial effusion (noninflammatory): Secondary | ICD-10-CM

## 2014-03-12 DIAGNOSIS — I319 Disease of pericardium, unspecified: Secondary | ICD-10-CM

## 2014-03-12 DIAGNOSIS — I2510 Atherosclerotic heart disease of native coronary artery without angina pectoris: Secondary | ICD-10-CM

## 2014-03-12 DIAGNOSIS — Z9989 Dependence on other enabling machines and devices: Secondary | ICD-10-CM

## 2014-03-12 DIAGNOSIS — R9430 Abnormal result of cardiovascular function study, unspecified: Secondary | ICD-10-CM

## 2014-03-12 DIAGNOSIS — I209 Angina pectoris, unspecified: Secondary | ICD-10-CM

## 2014-03-12 DIAGNOSIS — I313 Pericardial effusion (noninflammatory): Secondary | ICD-10-CM

## 2014-03-12 DIAGNOSIS — I255 Ischemic cardiomyopathy: Secondary | ICD-10-CM

## 2014-03-12 DIAGNOSIS — E119 Type 2 diabetes mellitus without complications: Secondary | ICD-10-CM

## 2014-03-12 DIAGNOSIS — E785 Hyperlipidemia, unspecified: Secondary | ICD-10-CM

## 2014-03-12 DIAGNOSIS — G4733 Obstructive sleep apnea (adult) (pediatric): Secondary | ICD-10-CM | POA: Insufficient documentation

## 2014-03-12 DIAGNOSIS — I214 Non-ST elevation (NSTEMI) myocardial infarction: Secondary | ICD-10-CM

## 2014-03-12 MED ORDER — NEBIVOLOL HCL 5 MG PO TABS: 5.0000 mg | ORAL_TABLET | Freq: Every day | ORAL | Status: DC

## 2014-03-12 NOTE — Progress Notes (Signed)
Patient ID: Kristopher [name], male   DOB: Jan 23, 1951, 63 y.o.   MRN: [id number] Patient ID: Kristopher [name], male   DOB: [birth year], 63 y.o.   MRN: [id number]       HPI: Kristopher [name] is a 63 y.o. male who presents for a 5 month follow-up cardiology evaluation.  Kristopher [name] is a 63 year old male with a history of type 2 diabetes mellitus, hypertension, atrial fibrillation who is status post ablation, as well as small chronic pericardial effusion documented for at least 7 years. He presented to [facility] on 06/27/2013 with chest pain radiating to his upper extremity and back. Cardiac markers were positive with a troponin of 7.54 compatible with NSTEMI. He underwent acute catheterization by me on 06/27/2013 and was found to have high-grade diffuse 90+ % distal RCA stenoses beyond the acute margin and proximal to the PDA takeoff  and mild 10 at 20% narrowing in the LAD with a normal circumflex vessel. EF 45-50%. He underwent successful stenting of his distal RCA and a Xience Alpine DES stent 3.0x23 mm was inserted and post dilated to 3.25 mm. He was treated with aspirin and Brilinta for dual antiplatelet therapy. His troponin peaked at 20.  Post discharge, he developed recurrent chest pain leading to readmission and he underwent repeat cardiac catheterization by Dr. Shon Hough on 07/10/13. His RCA stent was widely patent and ejection fraction was 40%.He later saw [name], PAC on 07/17/13 and his pulse was 100. His beta blocker was increased from Toprol-XL 25 to 50 mg. He has experienced some episodes of chest discomfort since that time. He presented to cardiac rehabilitation 2 weeks ago and because of his recent chest pain they did not feel comfortable having him exercise and saw me in our office on 07/29/13.   He has  a chronic peicardial effusion since at least 2008. An echo Doppler study on 06/27/2013 revealed an ejection fraction of 40% with grade 2 diastolic dysfunction. A small circumferential paracardial  effusion with no evidence for tamponade was again demonstrated.  He continued to experience mild episodes of chest pressure. Oftentimes these are nonexertional and sublingual nitroglycerin results improvement in approximately 5 minutes. He denies exertional chest pain. When I  saw him in the spring I further titrate his Toprol to 100 mg daily. At the time of that evaluation he did have an increased pulse rate with ventricular trigeminy. He feels his pulse is more stable and he is not aware of any extra heartbeats.  Because of his recurrent episodes of chest pain he underwent an exercise Myoview study to make certain he has not developed any subsequent ischemia.  His resting blood pressure was 125/81 and heart rate in the eighth. Rest nuclear images were obtained. He exercised on the Bruce protocol total of 7 minutes and 24 seconds. During stage III of exercise he developed increasing shortness of breath chest tightness which he characterized as 6/10 wheezing and felt he could not go any further and therefore stopped. At that point, he was less than 80% predicted maximal heart rate and consequently was not injected with his stress pharmaceutical dose. He had mild ST segments at rest and these increased to slightly 0.5 to less than 1.0 at peak stress. However, his blood pressure dropped to 87/50 with this episode of chest tightness.   He underwent follow-upcardiac catheterization, which was done on 08/28/2013. There was no evidence for restenosis of his widely patent stent in the distal right coronary artery and a previously noted  30% narrowing in the mid RCA.  The LAD was normal in its proximal segment with mild intramyocardial dipping without evidence for systolic bridging.  He had a normal left circumflex coronary artery.  Subsequently he was again readmitted to the hospital with recurrent chest pain for days after his cardiac catheterization.  During that hospitalization he was also evaluated by Dr.  Roxan Hockey in light of his pericardial effusion. It was not felt that he was a candidate for pericardial window. Subsequent catheterization on 10/07/13 due to continued problems of recurrent chest pain did not reveal any evidence for constrictive or restrictive physiology.  His coronary anatomy was unchanged from his prior catheterization.  He has seen his primary physician, Dr. Irineo Axon.  Some of his medications have been reduced in the event her to low blood pressure.  Since I last saw him he apparently has been taken off his Ranexa, Toprol, and isosorbide by Dr. Yancey Flemings.  He presented to the emergency room [birth year] with dizziness and was felt to have a vasovagal episode.  He tells me he still experiences occasional episodes of chest pain, and typically takes nitroglycerin sublingually very frequently, almost on a daily basis.  He is now on CPAP therapy for obstructive sleep apnea.  He presents for followup evaluation.  Past Medical History  Diagnosis Date  . Herniated cervical disc     c2-T1 fusion  . Atrial fibrillation     a. s/p PVI X2 by Dr Omelia Blackwater at [facility] around 2000.  Marland Kitchen CAD (coronary artery disease)     a. NSTEMI (06/2013):  LHC (06/27/2013):  pLAD 10-20, RCA 90, EF 45-50%, inf HK.  PCI:  Xience Alpine (3 x 23 mm) DES to the RCA. b. Relook cath 07/10/13 - patent stent. Imdur added. c. Recurrent CP after that - abnormal nuc 07/2017 with chest pain, dyspnea, hypotensive response. Rest images with mid-basal inferior thinning. No stress images due to abrupt d/c of test. Relook cath 3/12 - no significant r  . Ischemic cardiomyopathy     a. Echo (06/28/2011): Inferior and inferoseptal akinesis, EF 76%, grade 2 diastolic dysfunction, MAC, small effusion (no tamponade). B. EF 40% by echo 06/2013, cath 07/2013, 55% by cath 08/2013 with subtle mild mid inferior hypocontractility.  Marland Kitchen HTN (hypertension)   . HLD (hyperlipidemia)   . Pericardial effusion     a. Chronic since hx of pericarditis in  2004. b. Mod by echo 06/2013, small by echo 06/2013.  Marland Kitchen Depression   . Anginal pain   . NSTEMI (non-ST elevated myocardial infarction) 06-27-2013  . Pneumonia ~ 2005  . Type II diabetes mellitus   . Osteoarthritis     "neck and lower back" (09/01/2013)  . DJD (degenerative joint disease) of cervical spine     "3 herniated discs" (09/01/2013)  . DJD (degenerative joint disease), lumbosacral     "bulging L5-S1" (09/01/2013)  . Chronic back pain greater than 3 months duration     Past Surgical History  Procedure Laterality Date  . Posterior cervical fusion/foraminotomy  2009    fusion c3-t1  . Atrial fibrillation ablation  2000    Dr Omelia Blackwater at [facility]  . Appendectomy  ~ 1965  . Tonsillectomy  1959  . Pulmonary vein ablation  2001    Dr Omelia Blackwater at [place]  . Codeine Nausea And Vomiting  . Fentanyl Nausea And Vomiting  . Naproxen Nausea Only  . Other Nausea And Vomiting  1. N/V to all opiates. He is able to take versed. (noted 09/29/13) 2.The patient states that he has had  a reaction to all narcotics. He can take them though. (noted 06/26/13)    Current Outpatient Prescriptions  Medication Sig Dispense Refill  . Ascorbic Acid (VITAMIN C) 1000 MG tablet Take 1,000 mg by mouth daily.      Marland Kitchen aspirin EC 81 MG tablet Take 81 mg by mouth daily.      . B Complex-Biotin-FA (VITAMIN B50 COMPLEX PO) Take 1 capsule by mouth daily.      . baclofen (LIORESAL) 10 MG tablet Take 10 mg by mouth 3 (three) times daily as needed for muscle spasms.       Marland Kitchen buPROPion (WELLBUTRIN XL) 150 MG 24 hr tablet Take 150 mg by mouth daily.      . clopidogrel (PLAVIX) 75 MG tablet Take 1 tablet (75 mg total) by mouth daily.  30 tablet  11  . colchicine 0.6 MG tablet Take 0.6 mg by mouth daily.      . cyclobenzaprine (FLEXERIL) 10 MG tablet Take 10 mg by mouth 3 (three) times daily as needed for muscle spasms.      Marland Kitchen diltiazem (CARDIZEM CD) 120 MG 24 hr capsule Take 120 mg by mouth  daily.      . DULoxetine (CYMBALTA) 20 MG capsule Take 20 mg by mouth daily.      . fenofibrate 160 MG tablet Take 160 mg by mouth at bedtime.      . gabapentin (NEURONTIN) 300 MG capsule Take 300-600 mg by mouth 3 (three) times daily. Take 661m in the morning, 3031min the afternoon, and 60061mt bedtime.      . gMarland Kitchenimepiride (AMARYL) 2 MG tablet Take 2 mg by mouth 2 (two) times daily.       . iMarland Kitchenuprofen (ADVIL,MOTRIN) 200 MG tablet Take 800 mg by mouth every 6 (six) hours as needed (pain).      . insulin detemir (LEVEMIR) 100 UNIT/ML injection Inject 22 Units into the skin at bedtime.       . lMarland Kitchensinopril (PRINIVIL,ZESTRIL) 2.5 MG tablet Take 1 tablet (2.5 mg total) by mouth daily.  30 tablet  6  . nitroGLYCERIN (NITROSTAT) 0.4 MG SL tablet Place 0.4 mg under the tongue every 5 (five) minutes as needed for chest pain.      . sitaGLIPtin-metformin (JANUMET) 50-500 MG per tablet Take 1 tablet by mouth 2 (two) times daily with a meal.       No current facility-administered medications for this visit.    History   Social History  . Marital Status: Married    Spouse Name: N/A    Number of Children: N/A  . Years of Education: N/A   Occupational History  . RESP      Resp Therapist at [employer]  . Smoking status: Former Smoker -- 1.50 packs/day for 43 years    Types: Cigarettes    Quit date: 05/06/2007  . Smokeless tobacco: Never Used  . Alcohol Use: Yes     Comment: 09/01/2013 "might have a drink a couple times/yr"  . Drug Use: No  . Sexual Activity: Not Currently   Other Topics Concern  . Not on file   Social History Narrative  . No narrative on file   Socially he is married. 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 There is no tobacco use.  Family History  Problem Relation Age of Onset  . CAD Neg Hx     No hx early CAD  . COPD Other   . Cancer Other   . Diabetes Other   . Heart disease Other     ROS General: Negative; No fevers, chills, or night sweats;  HEENT: Negative; No changes in vision or hearing, sinus congestion, difficulty swallowing Pulmonary: Negative; No cough, wheezing, shortness of breath, hemoptysis Cardiovascular: see HPI: No presyncope, syncope, palpatations GI: Negative; No nausea, vomiting, diarrhea, or abdominal pain GU: Negative; No dysuria, hematuria, or difficulty voiding Musculoskeletal: Negative; no myalgias, joint pain, or weakness Hematologic/Oncology: Negative; no easy bruising, bleeding Endocrine: Positive for diabetes mellitus no heat/cold intolerance;  Neuro: Negative; no changes in balance, headaches Skin: Negative; No rashes or skin lesions Psychiatric: History of depression No behavioral problems,  Sleep: Negative; No snoring, daytime sleepiness, hypersomnolence, bruxism, restless legs, hypnogognic hallucinations, no cataplexy Other comprehensive 14 point system review is negative.   PE BP 110/62  Pulse 102  Ht 5' 7"  (1.702 m)  Wt 190 lb 1.6 oz (86.229 kg)  BMI 29.77 kg/m2  Repeat blood pressure by me was 118/70, supine, and was 112/70 standing.  There was no significant orthostatic pulse rise. General: Alert, oriented, no distress.  Skin: normal turgor, no rashes HEENT: Normocephalic, atraumatic. Pupils round and reactive; sclera anicteric;no lid lag. Extraocular muscles intact. No Nose without nasal septal hypertrophy Mouth/Parynx benign; Mallinpatti scale 3 Neck: No JVD, no carotid bruits; normal carotid upstroke Lungs: clear to ausculatation and percussion; no wheezing or rales Chest wall: no tenderness to palpitation Heart:  Tachycardic at 102 beats per minute.  No ectopy. s1 s2 normal no S3 or S4 gallop. No rub. 1/6 systolic murmur; no friction rub.  Abdomen: soft, nontender; no hepatosplenomehaly, BS+; abdominal aorta nontender and not dilated by palpation. Back: no CVA tenderness Pulses 2+ right femoral and right radial cath  sites well healed  Extremities: no clubbing cyanosis or edema, Homan's sign negative  Neurologic: grossly nonfocal; cranial nerves grossly normal. Psychologic: normal affect and mood.  ECG (independently read by me): Sinus tachycardia 102 beats per minute.  Small Q-wave in lead 3 and nondiagnostic in 2 and F.  11/04/2013 ECG (independently read by me): Sinus rhythm 84 beats per minute.  No ST segment changes.  QTc interval 456 ms.  Prior 09/11/2013 ECG (independently read by me): Normal sinus rhythm at 77 beats per minute.  PR interval 146 ms; QT interval 468 ms.  Nonspecific ST-T changes.  Recent ECG of 08/07/2013  (independently read by me): Sinus rhythm at 86; no ectopy  Prior ECG of 07/29/2013 (independently read by me): Underlying sinus rhythm with ventricular trigeminy. Sinus rate 97 beats per minute. A small Q wave in lead 3.  LABS:  BMET    Component Value Date/Time   NA 142 12/20/2013 1718   K 4.2 12/20/2013 1718   CL 102 12/20/2013 1718   CO2 26 12/20/2013 1718   GLUCOSE 151* 12/20/2013 1718   BUN 24* 12/20/2013 1718   CREATININE 1.30 12/20/2013 1718   CREATININE 0.90 10/02/2013 0851   CALCIUM 9.0 12/20/2013 1718   GFRNONAA 58* 12/20/2013 1718   GFRAA 67* 12/20/2013 1718     Hepatic Function Panel     Component Value Date/Time   PROT 6.6 12/20/2013 1718   ALBUMIN 3.9 12/20/2013 1718   AST 50* 12/20/2013 1718   ALT 71* 12/20/2013 1718   ALKPHOS 54 12/20/2013 1718   BILITOT 0.5 12/20/2013 1718  BILIDIR <0.2 09/01/2013 1523   IBILI NOT CALCULATED 09/01/2013 1523     CBC    Component Value Date/Time   WBC 5.1 12/20/2013 1718   RBC 3.58* 12/20/2013 1718   HGB 12.0* 12/20/2013 1718   HCT 34.1* 12/20/2013 1718   PLT 191 12/20/2013 1718   MCV 95.3 12/20/2013 1718   MCH 33.5 12/20/2013 1718   MCHC 35.2 12/20/2013 1718   RDW 13.5 12/20/2013 1718   LYMPHSABS 1.0 12/20/2013 1718   MONOABS 0.3 12/20/2013 1718   EOSABS 0.1 12/20/2013 1718   BASOSABS 0.0 12/20/2013 1718     BNP    Component Value Date/Time    PROBNP 166.2* 09/01/2013 1523    Lipid Panel     Component Value Date/Time   CHOL 175 06/27/2013 0630   TRIG 152* 08/07/2013 0857   TRIG 126 06/27/2013 0630   HDL 31* 06/27/2013 0630   CHOLHDL 5.6 06/27/2013 0630   VLDL 25 06/27/2013 0630   LDLCALC 49 08/07/2013 0857   LDLCALC 119* 06/27/2013 0630     RADIOLOGY: No results found.    ASSESSMENT AND PLAN: Mr. Shamell Suarez is a 63 year old gentleman with diabetes mellitus for at least 7 years, a history of hyperlipidemia, who suffered a  non-ST segment elevation myocardial infarction secondary to high-grade distal RCA disease which was successfully stented on 06/27/2013. Repeat catheterizations for recurrent chest pain have demonstrated a widely patent stent and no additional progression of mild concomitant CAD. He does have a portion of his mid LAD, which dips intramyocardially, however, there was no evidence for any systolic muscular bridging.  In addition, a right heart catheterization that did not reveal any evidence for constrictive or restrictive physiology.  I last saw him, I elected to low-dose ACE inhibitor and and suggested the addition of L-arginine in attempt to improve endothelial function with improved vasodilatory capacity vis the nitric oxide mechanism.Marland Kitchen  He apparently is now off beta blocker therapy.  His resting pulse is tachycardic.  I am suggesting a trial of a diastolic 2.5 mg for one week and then he will increase this to 5 mg.  This should provide additional potential nitric oxide mediated benefit and should not contribute to additional fatigue.  I will see him in 4 months for followup evaluation.  Troy Sine, MD, [facility]  03/12/2014 3:51 PM

## 2014-03-12 NOTE — Patient Instructions (Signed)
Start Bystolic 5 mg take 1/2 tablet daily for 1 week then increase to 1 whole tablet 5 mg daily   Your physician wants you to follow-up in: 6 month. You will receive a reminder letter in the mail two months in advance. If you don't receive a letter, please call our office to schedule the follow-up appointment.

## 2014-03-16 ENCOUNTER — Other Ambulatory Visit (HOSPITAL_COMMUNITY): Payer: Self-pay | Admitting: Physician Assistant

## 2014-03-20 ENCOUNTER — Encounter (HOSPITAL_COMMUNITY): Payer: Self-pay | Admitting: Emergency Medicine

## 2014-03-20 DIAGNOSIS — Z8673 Personal history of transient ischemic attack (TIA), and cerebral infarction without residual deficits: Secondary | ICD-10-CM | POA: Insufficient documentation

## 2014-03-20 DIAGNOSIS — M1990 Unspecified osteoarthritis, unspecified site: Secondary | ICD-10-CM | POA: Diagnosis not present

## 2014-03-20 DIAGNOSIS — I10 Essential (primary) hypertension: Secondary | ICD-10-CM | POA: Diagnosis not present

## 2014-03-20 DIAGNOSIS — I252 Old myocardial infarction: Secondary | ICD-10-CM | POA: Insufficient documentation

## 2014-03-20 DIAGNOSIS — I2510 Atherosclerotic heart disease of native coronary artery without angina pectoris: Secondary | ICD-10-CM | POA: Diagnosis not present

## 2014-03-20 DIAGNOSIS — G8929 Other chronic pain: Secondary | ICD-10-CM | POA: Diagnosis not present

## 2014-03-20 DIAGNOSIS — Z8701 Personal history of pneumonia (recurrent): Secondary | ICD-10-CM | POA: Diagnosis not present

## 2014-03-20 DIAGNOSIS — Z7982 Long term (current) use of aspirin: Secondary | ICD-10-CM | POA: Diagnosis not present

## 2014-03-20 DIAGNOSIS — E1165 Type 2 diabetes mellitus with hyperglycemia: Secondary | ICD-10-CM | POA: Insufficient documentation

## 2014-03-20 DIAGNOSIS — Z79899 Other long term (current) drug therapy: Secondary | ICD-10-CM | POA: Insufficient documentation

## 2014-03-20 DIAGNOSIS — Z9889 Other specified postprocedural states: Secondary | ICD-10-CM | POA: Diagnosis not present

## 2014-03-20 DIAGNOSIS — Z7902 Long term (current) use of antithrombotics/antiplatelets: Secondary | ICD-10-CM | POA: Insufficient documentation

## 2014-03-20 DIAGNOSIS — Z794 Long term (current) use of insulin: Secondary | ICD-10-CM | POA: Insufficient documentation

## 2014-03-20 DIAGNOSIS — E785 Hyperlipidemia, unspecified: Secondary | ICD-10-CM | POA: Insufficient documentation

## 2014-03-20 DIAGNOSIS — F329 Major depressive disorder, single episode, unspecified: Secondary | ICD-10-CM | POA: Diagnosis not present

## 2014-03-20 DIAGNOSIS — R413 Other amnesia: Secondary | ICD-10-CM | POA: Diagnosis present

## 2014-03-20 DIAGNOSIS — F12129 Cannabis abuse with intoxication, unspecified: Secondary | ICD-10-CM | POA: Diagnosis not present

## 2014-03-20 DIAGNOSIS — Z87891 Personal history of nicotine dependence: Secondary | ICD-10-CM | POA: Diagnosis not present

## 2014-03-20 LAB — I-STAT CHEM 8, ED
BUN: 24 mg/dL — AB (ref 6–23)
CALCIUM ION: 1.16 mmol/L (ref 1.13–1.30)
Chloride: 100 mEq/L (ref 96–112)
Creatinine, Ser: 1.3 mg/dL (ref 0.50–1.35)
Glucose, Bld: 215 mg/dL — ABNORMAL HIGH (ref 70–99)
HCT: 37 % — ABNORMAL LOW (ref 39.0–52.0)
HEMOGLOBIN: 12.6 g/dL — AB (ref 13.0–17.0)
Potassium: 4.1 mEq/L (ref 3.7–5.3)
Sodium: 133 mEq/L — ABNORMAL LOW (ref 137–147)
TCO2: 24 mmol/L (ref 0–100)

## 2014-03-20 LAB — CBC
HCT: 36.2 % — ABNORMAL LOW (ref 39.0–52.0)
Hemoglobin: 12.8 g/dL — ABNORMAL LOW (ref 13.0–17.0)
MCH: 34 pg (ref 26.0–34.0)
MCHC: 35.4 g/dL (ref 30.0–36.0)
MCV: 96.3 fL (ref 78.0–100.0)
PLATELETS: 160 10*3/uL (ref 150–400)
RBC: 3.76 MIL/uL — ABNORMAL LOW (ref 4.22–5.81)
RDW: 14 % (ref 11.5–15.5)
WBC: 4.9 10*3/uL (ref 4.0–10.5)

## 2014-03-20 LAB — BASIC METABOLIC PANEL
Anion gap: 15 (ref 5–15)
BUN: 25 mg/dL — ABNORMAL HIGH (ref 6–23)
CO2: 24 mEq/L (ref 19–32)
CREATININE: 1.24 mg/dL (ref 0.50–1.35)
Calcium: 9.2 mg/dL (ref 8.4–10.5)
Chloride: 94 mEq/L — ABNORMAL LOW (ref 96–112)
GFR, EST AFRICAN AMERICAN: 71 mL/min — AB (ref >90)
GFR, EST NON AFRICAN AMERICAN: 61 mL/min — AB (ref >90)
Glucose, Bld: 208 mg/dL — ABNORMAL HIGH (ref 70–99)
Potassium: 4.4 mEq/L (ref 3.7–5.3)
Sodium: 133 mEq/L — ABNORMAL LOW (ref 137–147)
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
## 2014-03-21 ENCOUNTER — Emergency Department (HOSPITAL_COMMUNITY): Payer: 59

## 2014-03-21 ENCOUNTER — Emergency Department (HOSPITAL_COMMUNITY)
Admission: EM | Admit: 2014-03-21 | Discharge: 2014-03-21 | Disposition: A | Discharge status: Home / Self Care | Payer: 59 | Attending: Emergency Medicine | Admitting: Emergency Medicine

## 2014-03-21 DIAGNOSIS — R739 Hyperglycemia, unspecified: Secondary | ICD-10-CM

## 2014-03-21 DIAGNOSIS — R413 Other amnesia: Secondary | ICD-10-CM

## 2014-03-21 LAB — PROTIME-INR
INR: 1.1 (ref 0.00–1.49)
PROTHROMBIN TIME: 14.2 s (ref 11.6–15.2)

## 2014-03-21 LAB — APTT: aPTT: 29 seconds (ref 24–37)

## 2014-03-21 LAB — RAPID URINE DRUG SCREEN, HOSP PERFORMED
Amphetamines: NOT DETECTED
BARBITURATES: NOT DETECTED
Benzodiazepines: NOT DETECTED
Cocaine: NOT DETECTED
OPIATES: NOT DETECTED
TETRAHYDROCANNABINOL: POSITIVE — AB

## 2014-03-21 LAB — URINALYSIS, ROUTINE W REFLEX MICROSCOPIC
BILIRUBIN URINE: NEGATIVE
Glucose, UA: NEGATIVE mg/dL
Hgb urine dipstick: NEGATIVE
Ketones, ur: NEGATIVE mg/dL
LEUKOCYTES UA: NEGATIVE
NITRITE: NEGATIVE
PH: 6 (ref 5.0–8.0)
Protein, ur: NEGATIVE mg/dL
SPECIFIC GRAVITY, URINE: 1.009 (ref 1.005–1.030)
UROBILINOGEN UA: 0.2 mg/dL (ref 0.0–1.0)

## 2014-03-21 LAB — TROPONIN I

## 2014-03-21 LAB — ETHANOL

## 2014-03-21 NOTE — ED Notes (Signed)
Pt. Ambulated well with out assitance

## 2014-03-21 NOTE — ED Notes (Signed)
Patient transported to CT 

## 2014-03-21 NOTE — ED Provider Notes (Signed)
CSN: [id number]     Arrival date & time 03/20/14  2222 History   First MD Initiated Contact with Patient 03/21/14 0126     Chief Complaint  Patient presents with  . Memory Loss      The history is provided by the patient and the spouse.   Patient presents for memory loss He reports earlier tonight (about 6 hours prior to my evaluation) he had a two hour episode that he can not recall any events and he had an episode of incontinence He reports he drove to [employer], and the next thing he can recall he was driving his car towards [place] from [place] 2 hrs later Wife reports she spoke to him during this time via phone and he was acting appropriately   Pt has never had this before He reports recent initiation of bystolic for BP but no other new meds  No fever/vomiting.  No cp/sob No focal weakness His course is improving Nothing worsened his symptoms   No h/o seizures   Past Medical History  Diagnosis Date  . Herniated cervical disc     c2-T1 fusion  . Atrial fibrillation     a. s/p PVI X2 by Dr Omelia Blackwater at [facility] around 2000.  Marland Kitchen CAD (coronary artery disease)     a. NSTEMI (06/2013):  LHC (06/27/2013):  pLAD 10-20, RCA 90, EF 45-50%, inf HK.  PCI:  Xience Alpine (3 x 23 mm) DES to the RCA. b. Relook cath 07/10/13 - patent stent. Imdur added. c. Recurrent CP after that - abnormal nuc 07/2017 with chest pain, dyspnea, hypotensive response. Rest images with mid-basal inferior thinning. No stress images due to abrupt d/c of test. Relook cath 3/12 - no significant r  . Ischemic cardiomyopathy     a. Echo (06/28/2011): Inferior and inferoseptal akinesis, EF 03%, grade 2 diastolic dysfunction, MAC, small effusion (no tamponade). B. EF 40% by echo 06/2013, cath 07/2013, 55% by cath 08/2013 with subtle mild mid inferior hypocontractility.  Marland Kitchen HTN (hypertension)   . HLD (hyperlipidemia)   . Pericardial effusion     a. Chronic since hx of pericarditis in 2004. b. Mod by echo 06/2013,  small by echo 06/2013.  Marland Kitchen Depression   . Anginal pain   . NSTEMI (non-ST elevated myocardial infarction) 06-27-2013  . Pneumonia ~ 2005  . Type II diabetes mellitus   . Osteoarthritis     "neck and lower back" (09/01/2013)  . DJD (degenerative joint disease) of cervical spine     "3 herniated discs" (09/01/2013)  . DJD (degenerative joint disease), lumbosacral     "bulging L5-S1" (09/01/2013)  . Chronic back pain greater than 3 months duration    Past Surgical History  Procedure Laterality Date  . Posterior cervical fusion/foraminotomy  2009    fusion c3-t1  . Atrial fibrillation ablation  2000    Dr [facility]mel[facility]water at [facility]  . Appendectomy  ~ 1965  . Tonsillectomy  1959  . Pulmonary vein ablation  2001    Dr Omelia Blackwater at [facility] History  Problem Relation Age of Onset  . CAD Neg Hx     No hx early CAD  . COPD Other   . Cancer Other   . Diabetes Other   . Heart disease Other    History  Substance Use Topics  . Smoking status: Former Smoker -- 1.50 packs/day for 43 years    Types: Cigarettes    Quit date: 05/06/2007  . Smokeless  tobacco: Never Used  . Alcohol Use: Yes     Comment: 09/01/2013 "might have a drink a couple times/yr"    Review of Systems  Constitutional: Negative for fever.  Respiratory: Negative for shortness of breath.   Cardiovascular: Negative for chest pain.  Genitourinary:       Urinary incontinence   Neurological: Negative for weakness and headaches.  All other systems reviewed and are negative.     Allergies  Codeine; Fentanyl; Naproxen; and Other  Home Medications   Prior to Admission medications   Medication Sig Start Date End Date Taking? Authorizing Provider  Ascorbic Acid (VITAMIN C) 1000 MG tablet Take 1,000 mg by mouth daily.    Historical Provider, MD  aspirin EC 81 MG tablet Take 81 mg by mouth daily.    Historical Provider, MD  B Complex-Biotin-FA (VITAMIN B50 COMPLEX PO) Take 1 capsule by mouth daily.    Historical Provider, MD   baclofen (LIORESAL) 10 MG tablet Take 10 mg by mouth 3 (three) times daily as needed for muscle spasms.     Historical Provider, MD  buPROPion (WELLBUTRIN XL) 150 MG 24 hr tablet Take 150 mg by mouth daily.    Historical Provider, MD  clopidogrel (PLAVIX) 75 MG tablet Take 1 tablet (75 mg total) by mouth daily. 09/25/13   Isaiah Serge, NP  colchicine 0.6 MG tablet Take 0.6 mg by mouth daily. 09/04/13   Rhonda G Barrett, PA-C  cyclobenzaprine (FLEXERIL) 10 MG tablet Take 10 mg by mouth 3 (three) times daily as needed for muscle spasms.    Historical Provider, MD  diltiazem (CARDIZEM CD) 120 MG 24 hr capsule Take 120 mg by mouth daily.    Historical Provider, MD  DULoxetine (CYMBALTA) 20 MG capsule Take 20 mg by mouth daily.    Historical Provider, MD  fenofibrate 160 MG tablet Take 160 mg by mouth at bedtime.    Historical Provider, MD  gabapentin (NEURONTIN) 300 MG capsule Take 300-600 mg by mouth 3 (three) times daily. Take 625m in the morning, 3020min the afternoon, and 60040mt bedtime.    Historical Provider, MD  glimepiride (AMARYL) 2 MG tablet Take 2 mg by mouth 2 (two) times daily.     Historical Provider, MD  ibuprofen (ADVIL,MOTRIN) 200 MG tablet Take 800 mg by mouth every 6 (six) hours as needed (pain).    Historical Provider, MD  insulin detemir (LEVEMIR) 100 UNIT/ML injection Inject 22 Units into the skin at bedtime.     Historical Provider, MD  lisinopril (PRINIVIL,ZESTRIL) 2.5 MG tablet Take 1 tablet (2.5 mg total) by mouth daily. 11/04/13   ThoTroy SineD  nebivolol (BYSTOLIC) 5 MG tablet Take 1 tablet (5 mg total) by mouth daily. 03/12/14   ThoTroy SineD  nitroGLYCERIN (NITROSTAT) 0.4 MG SL tablet Place 0.4 mg under the tongue every 5 (five) minutes as needed for chest pain.    Historical Provider, MD  sitaGLIPtin-metformin (JANUMET) 50-500 MG per tablet Take 1 tablet by mouth 2 (two) times daily with a meal.    Historical Provider, MD   BP 100/66  Pulse 93  Temp(Src)  98.9 F (37.2 C) (Oral)  Resp 23  SpO2 92% Physical Exam CONSTITUTIONAL: Well developed/well nourished HEAD: Normocephalic/atraumatic EYES: EOMI/PERRL, no nystagmus, no ptosis ENMT: Mucous membranes moist NECK: supple no meningeal signs, no bruits SPINE:entire spine nontender CV: S1/S2 noted, no murmurs/rubs/gallops noted LUNGS: Lungs are clear to auscultation bilaterally, no apparent distress ABDOMEN: soft, nontender, no  rebound or guarding NEURO:Awake/alert, facies symmetric, no arm or leg drift is noted Equal 5/5 strength with shoulder abduction, elbow flex/extension, wrist flex/extension in upper extremities and equal hand grips bilaterally Equal 5/5 strength with hip flexion,knee flex/extension, foot dorsi/plantar flexion Cranial nerves 3/4/5/6/12/25/08/11/12 tested and intact Gait normal without ataxia No past pointing Sensation to light touch intact in all extremities EXTREMITIES: pulses normal, full ROM SKIN: warm, color normal PSYCH: no abnormalities of mood noted   ED Course  Procedures   2:51 AM Pt back to baseline Initial workup negative D/w dr Nicole Kindred, neurology, via phone After review of case, unlikely TIA/CVA/seizure given he spoke to wife during this episode 3:50 AM I gave patient option of admission for episode of memory loss of unclear etiology He would prefer to go home and f/u with PCP We discussed strict return precautions He reports since starting bystolic, he has had fatigue and he would like to stop it He is ambulatory, no distress He reports his BP usually runs in 90s to low 100s He denies HA/focal weakness, he denies cp/sob Will d/c home Labs Review Labs Reviewed  CBC - Abnormal; Notable for the following:    RBC 3.76 (*)    Hemoglobin 12.8 (*)    HCT 36.2 (*)    All other components within normal limits  BASIC METABOLIC PANEL - Abnormal; Notable for the following:    Sodium 133 (*)    Chloride 94 (*)    Glucose, Bld 208 (*)    BUN 25 (*)     GFR calc non Af Amer 61 (*)    GFR calc Af Amer 71 (*)    All other components within normal limits  URINE RAPID DRUG SCREEN (HOSP PERFORMED) - Abnormal; Notable for the following:    Tetrahydrocannabinol POSITIVE (*)    All other components within normal limits  I-STAT CHEM 8, ED - Abnormal; Notable for the following:    Sodium 133 (*)    BUN 24 (*)    Glucose, Bld 215 (*)    Hemoglobin 12.6 (*)    HCT 37.0 (*)    All other components within normal limits  ETHANOL  PROTIME-INR  APTT  URINALYSIS, ROUTINE W REFLEX MICROSCOPIC  TROPONIN I    Imaging Review Ct Head Wo Contrast  03/21/2014   CLINICAL DATA:  Memory loss. Episode of not remember anything while driving for 2 hr today.  EXAM: CT HEAD WITHOUT CONTRAST  TECHNIQUE: Contiguous axial images were obtained from the base of the skull through the vertex without intravenous contrast.  COMPARISON:  None.  FINDINGS: Ventricles and sulci appear symmetrical. Cavum septum pellucidum. No mass effect or midline shift. No abnormal extra-axial fluid collections. Gray-white matter junctions are distinct. Basal cisterns are not effaced. No evidence of acute intracranial hemorrhage. No depressed skull fractures. Mucosal thickening and retention cysts in the maxillary antra. Mastoid air cells are not opacified.  IMPRESSION: No acute intracranial abnormalities.   Electronically Signed   By: Lucienne Capers M.D.   On: 03/21/2014 02:30     EKG Interpretation   Date/Time:  Friday March 20 2014 23:10:46 EDT Ventricular Rate:  95 PR Interval:  138 QRS Duration: 88 QT Interval:  358 QTC Calculation: 449 R Axis:   42 Text Interpretation:  Normal sinus rhythm Cannot rule out Anterior infarct  , age undetermined Abnormal ECG No significant change since last tracing  Confirmed by Christy Gentles  MD, Arvin ([id number]) on 03/21/2014 1:31:56 AM      MDM  Final diagnoses:  Memory loss of unknown cause  Hyperglycemia    Nursing notes including past  medical history and social history reviewed and considered in documentation Labs/vital reviewed and considered     Sharyon Cable, MD 03/21/14 [phone]

## 2014-03-21 NOTE — ED Notes (Signed)
Dr. Christy Gentles aware of orthostatic bp findings. Preparing for discharge.

## 2014-03-21 NOTE — ED Notes (Signed)
MD at bedside. 

## 2014-03-21 NOTE — ED Notes (Signed)
Pt was incontinent during his episode of not remembering what happened. Pt reports he started taking a new beta blocker one week ago, pt reports he has not felt well since he started taking it.

## 2014-03-21 NOTE — Discharge Instructions (Signed)
° °  RETURN IMMEDIATELY IF  you develop new shortness of breath, chest pain, fever, have difficulty moving parts of your body (new weakness, numbness, or incoordination), sudden change in speech, vision, swallowing, or understanding, faint or develop new dizziness, severe headache, become poorly responsive or have an altered mental status compared to baseline for you, new rash, abdominal pain, or bloody stools,  Return sooner also if you develop new problems for which you have not talked to your caregiver but you feel may be emergency medical conditions.

## 2014-03-23 ENCOUNTER — Other Ambulatory Visit: Payer: Self-pay

## 2014-03-23 ENCOUNTER — Telehealth: Payer: Self-pay | Admitting: Cardiovascular Disease

## 2014-03-23 MED ORDER — COLCHICINE 0.6 MG PO TABS: 0.6000 mg | ORAL_TABLET | Freq: Every day | ORAL | Status: DC

## 2014-03-23 NOTE — Telephone Encounter (Signed)
Spoke with pt, he feels fine today. His bp today is 100/60  And his pulse is running around 100. He has stopped the bystolic. He will see his PCP tomorrow. He will call back with further problems. Will make dr Claiborne Billings aware

## 2014-03-23 NOTE — Telephone Encounter (Signed)
rx sent to pharmacy

## 2014-03-23 NOTE — Telephone Encounter (Signed)
Pt called in having some concerns about Bystolic. He stated that on 10/2 he was driving his car and said that he had a 2 hour lapse in memory that he could not recall. He later went to the ED that night. Please call  thanks

## 2014-03-26 NOTE — Telephone Encounter (Signed)
Acknowledged.

## 2014-04-03 ENCOUNTER — Other Ambulatory Visit: Payer: Self-pay

## 2014-04-13 DIAGNOSIS — E7849 Other hyperlipidemia: Secondary | ICD-10-CM | POA: Insufficient documentation

## 2014-04-13 DIAGNOSIS — I2510 Atherosclerotic heart disease of native coronary artery without angina pectoris: Secondary | ICD-10-CM | POA: Insufficient documentation

## 2014-04-13 DIAGNOSIS — I255 Ischemic cardiomyopathy: Secondary | ICD-10-CM | POA: Insufficient documentation

## 2014-05-28 ENCOUNTER — Encounter (HOSPITAL_COMMUNITY): Payer: Self-pay | Admitting: Cardiovascular Disease

## 2014-06-04 DIAGNOSIS — R413 Other amnesia: Secondary | ICD-10-CM | POA: Insufficient documentation

## 2014-06-26 DIAGNOSIS — G2581 Restless legs syndrome: Secondary | ICD-10-CM | POA: Insufficient documentation

## 2014-07-22 DIAGNOSIS — I313 Pericardial effusion (noninflammatory): Secondary | ICD-10-CM | POA: Insufficient documentation

## 2014-07-22 DIAGNOSIS — I3139 Other pericardial effusion (noninflammatory): Secondary | ICD-10-CM | POA: Insufficient documentation

## 2014-08-06 ENCOUNTER — Other Ambulatory Visit: Payer: Self-pay

## 2014-08-06 MED ORDER — CLOPIDOGREL BISULFATE 75 MG PO TABS: 75.0000 mg | ORAL_TABLET | Freq: Every day | ORAL | Status: DC

## 2014-08-06 NOTE — Telephone Encounter (Signed)
Rx(s) sent to pharmacy electronically.  

## 2014-12-14 ENCOUNTER — Other Ambulatory Visit: Payer: Self-pay

## 2015-03-19 ENCOUNTER — Other Ambulatory Visit: Payer: Self-pay | Admitting: Physician Assistant

## 2015-03-19 DIAGNOSIS — G454 Transient global amnesia: Secondary | ICD-10-CM

## 2015-03-24 ENCOUNTER — Ambulatory Visit: Payer: BLUE CROSS/BLUE SHIELD

## 2015-03-29 ENCOUNTER — Ambulatory Visit
Admission: RE | Admit: 2015-03-29 | Discharge: 2015-03-29 | Disposition: A | Discharge status: Home / Self Care | Payer: BLUE CROSS/BLUE SHIELD | Source: Ambulatory Visit | Attending: Physician Assistant | Admitting: Physician Assistant

## 2015-03-29 ENCOUNTER — Other Ambulatory Visit: Payer: Self-pay | Admitting: Physician Assistant

## 2015-03-29 DIAGNOSIS — M542 Cervicalgia: Secondary | ICD-10-CM | POA: Diagnosis present

## 2015-03-29 DIAGNOSIS — M25512 Pain in left shoulder: Principal | ICD-10-CM

## 2015-03-29 DIAGNOSIS — M25519 Pain in unspecified shoulder: Secondary | ICD-10-CM | POA: Diagnosis present

## 2015-03-29 DIAGNOSIS — G8929 Other chronic pain: Secondary | ICD-10-CM

## 2015-03-29 DIAGNOSIS — M8580 Other specified disorders of bone density and structure, unspecified site: Secondary | ICD-10-CM | POA: Diagnosis not present

## 2015-05-14 ENCOUNTER — Other Ambulatory Visit: Payer: Self-pay | Admitting: Cardiovascular Disease

## 2015-08-14 ENCOUNTER — Other Ambulatory Visit: Payer: Self-pay | Admitting: Cardiovascular Disease

## 2015-12-17 DIAGNOSIS — K7460 Unspecified cirrhosis of liver: Secondary | ICD-10-CM | POA: Insufficient documentation

## 2016-01-01 ENCOUNTER — Emergency Department (HOSPITAL_COMMUNITY)
Admission: EM | Admit: 2016-01-01 | Discharge: 2016-01-02 | Disposition: A | Discharge status: Home / Self Care | Payer: BLUE CROSS/BLUE SHIELD | Attending: Emergency Medicine | Admitting: Emergency Medicine

## 2016-01-01 ENCOUNTER — Emergency Department (HOSPITAL_COMMUNITY): Payer: BLUE CROSS/BLUE SHIELD

## 2016-01-01 ENCOUNTER — Encounter (HOSPITAL_COMMUNITY): Payer: Self-pay

## 2016-01-01 DIAGNOSIS — F329 Major depressive disorder, single episode, unspecified: Secondary | ICD-10-CM | POA: Diagnosis not present

## 2016-01-01 DIAGNOSIS — R112 Nausea with vomiting, unspecified: Secondary | ICD-10-CM | POA: Diagnosis present

## 2016-01-01 DIAGNOSIS — Z79899 Other long term (current) drug therapy: Secondary | ICD-10-CM | POA: Insufficient documentation

## 2016-01-01 DIAGNOSIS — I252 Old myocardial infarction: Secondary | ICD-10-CM | POA: Diagnosis not present

## 2016-01-01 DIAGNOSIS — I4891 Unspecified atrial fibrillation: Secondary | ICD-10-CM | POA: Insufficient documentation

## 2016-01-01 DIAGNOSIS — R197 Diarrhea, unspecified: Secondary | ICD-10-CM | POA: Insufficient documentation

## 2016-01-01 DIAGNOSIS — E785 Hyperlipidemia, unspecified: Secondary | ICD-10-CM | POA: Diagnosis not present

## 2016-01-01 DIAGNOSIS — Z87891 Personal history of nicotine dependence: Secondary | ICD-10-CM | POA: Insufficient documentation

## 2016-01-01 DIAGNOSIS — Z7982 Long term (current) use of aspirin: Secondary | ICD-10-CM | POA: Insufficient documentation

## 2016-01-01 DIAGNOSIS — I2510 Atherosclerotic heart disease of native coronary artery without angina pectoris: Secondary | ICD-10-CM | POA: Diagnosis not present

## 2016-01-01 DIAGNOSIS — E119 Type 2 diabetes mellitus without complications: Secondary | ICD-10-CM | POA: Diagnosis not present

## 2016-01-01 HISTORY — DX: Unspecified cirrhosis of liver: K74.60

## 2016-01-01 LAB — COMPREHENSIVE METABOLIC PANEL
ALT: 23 U/L (ref 17–63)
AST: 19 U/L (ref 15–41)
Albumin: 4.8 g/dL (ref 3.5–5.0)
Alkaline Phosphatase: 101 U/L (ref 38–126)
Anion gap: 9 (ref 5–15)
BUN: 15 mg/dL (ref 6–20)
CHLORIDE: 101 mmol/L (ref 101–111)
CO2: 28 mmol/L (ref 22–32)
CREATININE: 0.57 mg/dL — AB (ref 0.61–1.24)
Calcium: 9.5 mg/dL (ref 8.9–10.3)
Glucose, Bld: 143 mg/dL — ABNORMAL HIGH (ref 65–99)
POTASSIUM: 3.3 mmol/L — AB (ref 3.5–5.1)
SODIUM: 138 mmol/L (ref 135–145)
TOTAL PROTEIN: 7.8 g/dL (ref 6.5–8.1)
Total Bilirubin: 1.4 mg/dL — ABNORMAL HIGH (ref 0.3–1.2)

## 2016-01-01 LAB — CBC
HCT: 40 % (ref 39.0–52.0)
Hemoglobin: 14.1 g/dL (ref 13.0–17.0)
MCH: 33.7 pg (ref 26.0–34.0)
MCHC: 35.3 g/dL (ref 30.0–36.0)
MCV: 95.5 fL (ref 78.0–100.0)
PLATELETS: 218 10*3/uL (ref 150–400)
RBC: 4.19 MIL/uL — ABNORMAL LOW (ref 4.22–5.81)
RDW: 15 % (ref 11.5–15.5)
WBC: 7.2 10*3/uL (ref 4.0–10.5)

## 2016-01-01 LAB — URINALYSIS, ROUTINE W REFLEX MICROSCOPIC
Bilirubin Urine: NEGATIVE
Glucose, UA: 100 mg/dL — AB
Hgb urine dipstick: NEGATIVE
KETONES UR: 15 mg/dL — AB
LEUKOCYTES UA: NEGATIVE
NITRITE: NEGATIVE
PROTEIN: NEGATIVE mg/dL
Specific Gravity, Urine: 1.019 (ref 1.005–1.030)
pH: 7 (ref 5.0–8.0)

## 2016-01-01 LAB — LIPASE, BLOOD: LIPASE: 19 U/L (ref 11–51)

## 2016-01-01 MED ORDER — ONDANSETRON HCL 4 MG/2ML IJ SOLN: 4.0000 mg | Freq: Once | INTRAMUSCULAR | Status: DC | PRN

## 2016-01-01 MED ORDER — PROMETHAZINE HCL 25 MG/ML IJ SOLN
25.0000 mg | Freq: Once | INTRAMUSCULAR | Status: AC
  Administered 2016-01-01: 25 mg via INTRAVENOUS
  Filled 2016-01-01: qty 1

## 2016-01-01 MED ORDER — PROMETHAZINE HCL 25 MG PO TABS: 25.0000 mg | ORAL_TABLET | Freq: Four times a day (QID) | ORAL | Status: DC | PRN

## 2016-01-01 MED ORDER — ONDANSETRON HCL 4 MG/2ML IJ SOLN
4.0000 mg | Freq: Once | INTRAMUSCULAR | Status: AC
  Administered 2016-01-01: 4 mg via INTRAVENOUS
  Filled 2016-01-01: qty 2

## 2016-01-01 MED ORDER — SODIUM CHLORIDE 0.9 % IV BOLUS (SEPSIS)
1000.0000 mL | Freq: Once | INTRAVENOUS | Status: AC
  Administered 2016-01-01: 1000 mL via INTRAVENOUS

## 2016-01-01 NOTE — ED Notes (Signed)
Pt transferred in error.

## 2016-01-01 NOTE — Discharge Instructions (Signed)
There does not appear to be an emergent cause for your symptoms at this time. Your exam, labs and ultrasound are all reassuring. Please take your medications as prescribed to help with her nausea. Be sure to stay well hydrated and drink lots of water, Gatorade or other electrolyte balance solution. Return to ED for any new or worsening symptoms as we discussed.  Nausea and Vomiting Nausea is a sick feeling that often comes before throwing up (vomiting). Vomiting is a reflex where stomach contents come out of your mouth. Vomiting can cause severe loss of body fluids (dehydration). Children and elderly adults can become dehydrated quickly, especially if they also have diarrhea. Nausea and vomiting are symptoms of a condition or disease. It is important to find the cause of your symptoms. CAUSES   Direct irritation of the stomach lining. This irritation can result from increased acid production (gastroesophageal reflux disease), infection, food poisoning, taking certain medicines (such as nonsteroidal anti-inflammatory drugs), alcohol use, or tobacco use.  Signals from the brain.These signals could be caused by a headache, heat exposure, an inner ear disturbance, increased pressure in the brain from injury, infection, a tumor, or a concussion, pain, emotional stimulus, or metabolic problems.  An obstruction in the gastrointestinal tract (bowel obstruction).  Illnesses such as diabetes, hepatitis, gallbladder problems, appendicitis, kidney problems, cancer, sepsis, atypical symptoms of a heart attack, or eating disorders.  Medical treatments such as chemotherapy and radiation.  Receiving medicine that makes you sleep (general anesthetic) during surgery. DIAGNOSIS Your caregiver may ask for tests to be done if the problems do not improve after a few days. Tests may also be done if symptoms are severe or if the reason for the nausea and vomiting is not clear. Tests may include:  Urine tests.  Blood  tests.  Stool tests.  Cultures (to look for evidence of infection).  X-rays or other imaging studies. Test results can help your caregiver make decisions about treatment or the need for additional tests. TREATMENT You need to stay well hydrated. Drink frequently but in small amounts.You may wish to drink water, sports drinks, clear broth, or eat frozen ice pops or gelatin dessert to help stay hydrated.When you eat, eating slowly may help prevent nausea.There are also some antinausea medicines that may help prevent nausea. HOME CARE INSTRUCTIONS   Take all medicine as directed by your caregiver.  If you do not have an appetite, do not force yourself to eat. However, you must continue to drink fluids.  If you have an appetite, eat a normal diet unless your caregiver tells you differently.  Eat a variety of complex carbohydrates (rice, wheat, potatoes, bread), lean meats, yogurt, fruits, and vegetables.  Avoid high-fat foods because they are more difficult to digest.  Drink enough water and fluids to keep your urine clear or pale yellow.  If you are dehydrated, ask your caregiver for specific rehydration instructions. Signs of dehydration may include:  Severe thirst.  Dry lips and mouth.  Dizziness.  Dark urine.  Decreasing urine frequency and amount.  Confusion.  Rapid breathing or pulse. SEEK IMMEDIATE MEDICAL CARE IF:   You have blood or brown flecks (like coffee grounds) in your vomit.  You have black or bloody stools.  You have a severe headache or stiff neck.  You are confused.  You have severe abdominal pain.  You have chest pain or trouble breathing.  You do not urinate at least once every 8 hours.  You develop cold or clammy skin.  You continue to vomit for longer than 24 to 48 hours.  You have a fever. MAKE SURE YOU:   Understand these instructions.  Will watch your condition.  Will get help right away if you are not doing well or get  worse.   This information is not intended to replace advice given to you by your health care provider. Make sure you discuss any questions you have with your health care provider.   Document Released: 06/05/2005 Document Revised: 08/28/2011 Document Reviewed: 11/02/2010 Elsevier Interactive Patient Education [employer].

## 2016-01-01 NOTE — ED Notes (Signed)
Pt c/o of emesis since Thursday. Pt states that he has not been able to tolerate PO since 1900 yesterday. Denies abdominal pain except when he vomits. Endorses diarrhea, vertigo and chills. Denies fever. A&Ox4. Ambulatory.

## 2016-01-01 NOTE — ED Provider Notes (Signed)
CSN: [id number]     Arrival date & time 01/01/16  1919 History   First MD Initiated Contact with Patient 01/01/16 2000     Chief Complaint  Patient presents with  . Emesis     (Consider location/radiation/quality/duration/timing/severity/associated sxs/prior Treatment) HPI Kristopher [name] is a 65 y.o. male history of coronary artery disease with stent placement on Plavix, and diabetes, appendectomy, here for evaluation of nausea, vomiting and diarrhea. Patient reports since yesterday evening, he has had persistent nausea and vomiting that he describes as "bile". He also reports diarrhea. Characterizes it as "brown watery poop". He denies any blood in his vomit or stool. Denies any fevers, chills or abdominal pain. No urinary symptoms. No recent travel, surgeries, antibiotic use or hospital admissions. He tried taking a Zofran he found at home earlier today, but it was out of date and did not work. No other modifying factors.  Past Medical History  Diagnosis Date  . Herniated cervical disc     c2-T1 fusion  . Atrial fibrillation ([place])     a. s/p PVI X2 by Dr Omelia Blackwater at [facility] around 2000.  Marland Kitchen CAD (coronary artery disease)     a. NSTEMI (06/2013):  LHC (06/27/2013):  pLAD 10-20, RCA 90, EF 45-50%, inf HK.  PCI:  Xience Alpine (3 x 23 mm) DES to the RCA. b. Relook cath 07/10/13 - patent stent. Imdur added. c. Recurrent CP after that - abnormal nuc 07/2017 with chest pain, dyspnea, hypotensive response. Rest images with mid-basal inferior thinning. No stress images due to abrupt d/c of test. Relook cath 3/12 - no significant r  . Ischemic cardiomyopathy     a. Echo (06/28/2011): Inferior and inferoseptal akinesis, EF 17%, grade 2 diastolic dysfunction, MAC, small effusion (no tamponade). B. EF 40% by echo 06/2013, cath 07/2013, 55% by cath 08/2013 with subtle mild mid inferior hypocontractility.  Marland Kitchen HLD (hyperlipidemia)   . Pericardial effusion     a. Chronic since hx of pericarditis in 2004. b. Mod by echo  06/2013, small by echo 06/2013.  Marland Kitchen Depression   . Anginal pain ([place])   . NSTEMI (non-ST elevated myocardial infarction) ([place]) 06-27-2013  . Pneumonia ~ 2005  . Type II diabetes mellitus ([place])   . Osteoarthritis     "neck and lower back" (09/01/2013)  . DJD (degenerative joint disease) of cervical spine     "3 herniated discs" (09/01/2013)  . DJD (degenerative joint disease), lumbosacral     "bulging L5-S1" (09/01/2013)  . Chronic back pain greater than 3 months duration   . Cirrhosis, non-alcoholic ([place])    Past Surgical History  Procedure Laterality Date  . Posterior cervical fusion/foraminotomy  2009    fusion c3-t1  . Atrial fibrillation ablation  2000    Dr Omelia Blackwater at [facility]  . Appendectomy  ~ 1965  . Tonsillectomy  1959  . Pulmonary vein ablation  2001    Dr Omelia Blackwater at [facility]  . Left heart catheterization with coronary angiogram N/A 06/27/2013    Procedure: LEFT HEART CATHETERIZATION WITH CORONARY ANGIOGRAM;  Surgeon: Troy Sine, MD;  Location: [facility] CATH LAB;  Service: Cardiovascular;  Laterality: N/A;  . Percutaneous coronary stent intervention (pci-s)  06/27/2013    Procedure: PERCUTANEOUS CORONARY STENT INTERVENTION (PCI-S);  Surgeon: Troy Sine, MD;  Location: [facility] CATH LAB;  Service: Cardiovascular;;  . Left heart catheterization with coronary angiogram N/A 07/10/2013    Procedure: LEFT HEART CATHETERIZATION WITH CORONARY ANGIOGRAM;  Surgeon: Burnell Blanks, MD;  Location: [place] CATH LAB;  Service: Cardiovascular;  Laterality: N/A;  . Left heart catheterization with coronary angiogram N/A 08/28/2013    Procedure: LEFT HEART CATHETERIZATION WITH CORONARY ANGIOGRAM;  Surgeon: Troy Sine, MD;  Location: [facility] CATH LAB;  Service: Cardiovascular;  Laterality: N/A;  . Left and right heart catheterization with coronary angiogram N/A 10/07/2013    Procedure: LEFT AND RIGHT HEART CATHETERIZATION WITH CORONARY ANGIOGRAM;  Surgeon: Troy Sine, MD;  Location: [facility] CATH LAB;  Service:  Cardiovascular;  Laterality: N/A;   Family History  Problem Relation Age of Onset  . CAD Neg Hx     No hx early CAD  . COPD Other   . Cancer Other   . Diabetes Other   . Heart disease Other    Social History  Substance Use Topics  . Smoking status: Former Smoker -- 1.50 packs/day for 43 years    Types: Cigarettes    Quit date: 05/06/2007  . Smokeless tobacco: Never Used  . Alcohol Use: Yes     Comment: 09/01/2013 "might have a drink a couple times/yr"    Review of Systems A 10 point review of systems was completed and was negative except for pertinent positives and negatives as mentioned in the history of present illness     Allergies  Codeine; Fentanyl; Naproxen; and Other  Home Medications   Prior to Admission medications   Medication Sig Start Date End Date Taking? Authorizing Provider  aspirin EC 81 MG tablet Take 81 mg by mouth every morning.    Yes Historical Provider, MD  buPROPion (WELLBUTRIN XL) 150 MG 24 hr tablet Take 150 mg by mouth daily with breakfast.    Yes Historical Provider, MD  clopidogrel (PLAVIX) 75 MG tablet Take 1 tablet (75 mg total) by mouth daily. Appointment needed for future refills Patient taking differently: Take 75 mg by mouth every evening. Appointment needed for future refills 05/17/15  Yes Troy Sine, MD  diltiazem (CARDIZEM SR) 120 MG 12 hr capsule Take 120 mg by mouth 2 (two) times daily. 11/19/15  Yes Historical Provider, MD  gabapentin (NEURONTIN) 300 MG capsule Take 300-600 mg by mouth 3 (three) times daily. Take 630m in the morning, 3010min the afternoon, and 60053mt bedtime.   Yes Historical Provider, MD  glimepiride (AMARYL) 2 MG tablet Take 2 mg by mouth 2 (two) times daily.    Yes Historical Provider, MD  HUMALOG KWIKPEN 200 UNIT/ML SOPN Inject 18-20 Units into the skin 2 (two) times daily as needed. If blood sugars are greater than 200 12/01/15  Yes Historical Provider, MD  ibuprofen (ADVIL,MOTRIN) 200 MG tablet Take 800 mg by  mouth every 6 (six) hours as needed (pain).   Yes Historical Provider, MD  LEVEMIR FLEXTOUCH 100 UNIT/ML Pen Inject 34 Units into the skin 2 (two) times daily. 11/19/15  Yes Historical Provider, MD  nitroGLYCERIN (NITROSTAT) 0.4 MG SL tablet Place 0.4 mg under the tongue every 5 (five) minutes as needed for chest pain.   Yes Historical Provider, MD  rOPINIRole (REQUIP XL) 2 MG 24 hr tablet Take 2 mg by mouth every evening. 12/23/15  Yes Historical Provider, MD  rosuvastatin (CRESTOR) 5 MG tablet Take 5 mg by mouth every evening. 10/07/15  Yes Historical Provider, MD  tiZANidine (ZANAFLEX) 4 MG tablet Take 4 mg by mouth 3 (three) times daily as needed. For muscle spasms. 11/24/15  Yes Historical Provider, MD  colchicine 0.6 MG tablet Take 1 tablet (0.6 mg total)  by mouth daily. Patient not taking: Reported on 01/01/2016 03/23/14   Troy Sine, MD  nebivolol (BYSTOLIC) 5 MG tablet Take 1 tablet (5 mg total) by mouth daily. Patient not taking: Reported on 01/01/2016 03/12/14   Troy Sine, MD  promethazine (PHENERGAN) 25 MG tablet Take 1 tablet (25 mg total) by mouth every 6 (six) hours as needed for nausea or vomiting. 01/01/16   Comer Locket, PA-C   BP 121/80 mmHg  Pulse 96  Temp(Src) 97.9 F (36.6 C) (Oral)  Resp 18  Ht 5' 7"  (1.702 m)  Wt 77.111 kg  BMI 26.62 kg/m2  SpO2 95% Physical Exam  Constitutional: He is oriented to person, place, and time. He appears well-developed and well-nourished.  HENT:  Head: Normocephalic and atraumatic.  Mouth/Throat: Oropharynx is clear and moist.  Eyes: Conjunctivae are normal. Pupils are equal, round, and reactive to light. Right eye exhibits no discharge. Left eye exhibits no discharge. No scleral icterus.  Neck: Neck supple.  Cardiovascular: Normal rate, regular rhythm and normal heart sounds.   Pulmonary/Chest: Effort normal and breath sounds normal. No respiratory distress. He has no wheezes. He has no rales.  Abdominal: Soft. There is no  tenderness.  Negative Murphy's. No other abdominal pain.  Musculoskeletal: He exhibits no tenderness.  Neurological: He is alert and oriented to person, place, and time.  Cranial Nerves II-XII grossly intact  Skin: Skin is warm and dry. No rash noted.  Psychiatric: He has a normal mood and affect.  Nursing note and vitals reviewed.   ED Course  Procedures (including critical care time) Labs Review Labs Reviewed  COMPREHENSIVE METABOLIC PANEL - Abnormal; Notable for the following:    Potassium 3.3 (*)    Glucose, Bld 143 (*)    Creatinine, Ser 0.57 (*)    Total Bilirubin 1.4 (*)    All other components within normal limits  CBC - Abnormal; Notable for the following:    RBC 4.19 (*)    All other components within normal limits  URINALYSIS, ROUTINE W REFLEX MICROSCOPIC (NOT AT [facility]) - Abnormal; Notable for the following:    Glucose, UA 100 (*)    Ketones, ur 15 (*)    All other components within normal limits  LIPASE, BLOOD    Imaging Review US Abdomen Complete  01/01/2016  CLINICAL DATA:  Nausea, vomiting, and diarrhea for 48 hours. EXAM: ABDOMEN ULTRASOUND COMPLETE COMPARISON:  None. FINDINGS: Gallbladder: No gallstones or wall thickening visualized. No sonographic Murphy sign noted by sonographer. Common bile duct: Diameter: 3 mm, normal Liver: No focal lesion identified. Within normal limits in parenchymal echogenicity. IVC: No abnormality visualized. Pancreas: Visualized portion unremarkable. Spleen: Size and appearance within normal limits. Right Kidney: Length: 12.7 cm. Echogenicity within normal limits. No mass or hydronephrosis visualized. Left Kidney: Length: 13 cm. Shadowing stone in the medial aspect of the left kidney measuring 2.8 cm diameter. No hydronephrosis. Normal parenchymal echotexture and thickness. Abdominal aorta: No aneurysm visualized. Other findings: None. IMPRESSION: Left intrarenal stone without evidence of obstruction. Examination is otherwise normal.  Electronically Signed   By: Lucienne Capers M.D.   On: 01/01/2016 22:27   I have personally reviewed and evaluated these images and lab results as part of my medical decision-making.   EKG Interpretation None      MDM  Presents for evaluation of 2 day history of nausea, vomiting and diarrhea. He has had no vomiting or diarrhea in emergency department. Tolerating oral fluids. Hemodynamically stable and afebrile. He  overall appears for a well and nontoxic. Screening labs are significant for an elevated total bili of 1.4. Possible biliary etiology, will obtain right upper quadrant ultrasound. Ultrasound overall unremarkable, incidental finding of left renal stone, discussed with patient. He overall appears very well, nontoxic and hemodynamically stable. Experiences relief with oral Phenergan, prescription given. Follow up with PCP as needed. Return precautions discussed. Final diagnoses:  Nausea vomiting and diarrhea        Comer Locket, PA-C 01/02/16 [id number]  Leonard Schwartz, MD 01/02/16 [phone]

## 2016-01-10 DIAGNOSIS — K760 Fatty (change of) liver, not elsewhere classified: Secondary | ICD-10-CM | POA: Diagnosis not present

## 2016-01-20 DIAGNOSIS — F3341 Major depressive disorder, recurrent, in partial remission: Secondary | ICD-10-CM | POA: Diagnosis not present

## 2016-01-20 DIAGNOSIS — M5011 Cervical disc disorder with radiculopathy,  high cervical region: Secondary | ICD-10-CM | POA: Diagnosis not present

## 2016-01-20 DIAGNOSIS — I25111 Atherosclerotic heart disease of native coronary artery with angina pectoris with documented spasm: Secondary | ICD-10-CM | POA: Diagnosis not present

## 2016-01-20 DIAGNOSIS — K760 Fatty (change of) liver, not elsewhere classified: Secondary | ICD-10-CM | POA: Diagnosis not present

## 2016-01-20 DIAGNOSIS — N401 Enlarged prostate with lower urinary tract symptoms: Secondary | ICD-10-CM | POA: Diagnosis not present

## 2016-01-20 DIAGNOSIS — E1159 Type 2 diabetes mellitus with other circulatory complications: Secondary | ICD-10-CM | POA: Diagnosis not present

## 2016-01-24 DIAGNOSIS — K766 Portal hypertension: Secondary | ICD-10-CM | POA: Diagnosis not present

## 2016-01-24 DIAGNOSIS — K3189 Other diseases of stomach and duodenum: Secondary | ICD-10-CM | POA: Diagnosis not present

## 2016-03-28 DIAGNOSIS — G4733 Obstructive sleep apnea (adult) (pediatric): Secondary | ICD-10-CM | POA: Diagnosis not present

## 2016-03-28 DIAGNOSIS — E785 Hyperlipidemia, unspecified: Secondary | ICD-10-CM | POA: Diagnosis not present

## 2016-03-28 DIAGNOSIS — I255 Ischemic cardiomyopathy: Secondary | ICD-10-CM | POA: Diagnosis not present

## 2016-03-28 DIAGNOSIS — I2510 Atherosclerotic heart disease of native coronary artery without angina pectoris: Secondary | ICD-10-CM | POA: Diagnosis not present

## 2016-05-22 DIAGNOSIS — K760 Fatty (change of) liver, not elsewhere classified: Secondary | ICD-10-CM | POA: Diagnosis not present

## 2016-05-22 DIAGNOSIS — N5201 Erectile dysfunction due to arterial insufficiency: Secondary | ICD-10-CM | POA: Diagnosis not present

## 2016-05-22 DIAGNOSIS — E1165 Type 2 diabetes mellitus with hyperglycemia: Secondary | ICD-10-CM | POA: Diagnosis not present

## 2016-05-22 DIAGNOSIS — F3341 Major depressive disorder, recurrent, in partial remission: Secondary | ICD-10-CM | POA: Diagnosis not present

## 2016-05-22 DIAGNOSIS — I25111 Atherosclerotic heart disease of native coronary artery with angina pectoris with documented spasm: Secondary | ICD-10-CM | POA: Diagnosis not present

## 2016-05-22 DIAGNOSIS — M5011 Cervical disc disorder with radiculopathy,  high cervical region: Secondary | ICD-10-CM | POA: Diagnosis not present

## 2016-05-22 DIAGNOSIS — E785 Hyperlipidemia, unspecified: Secondary | ICD-10-CM | POA: Diagnosis not present

## 2016-05-22 DIAGNOSIS — Z0001 Encounter for general adult medical examination with abnormal findings: Secondary | ICD-10-CM | POA: Diagnosis not present

## 2016-08-29 DIAGNOSIS — I25111 Atherosclerotic heart disease of native coronary artery with angina pectoris with documented spasm: Secondary | ICD-10-CM | POA: Diagnosis not present

## 2016-08-29 DIAGNOSIS — I10 Essential (primary) hypertension: Secondary | ICD-10-CM | POA: Diagnosis not present

## 2016-08-29 DIAGNOSIS — E1142 Type 2 diabetes mellitus with diabetic polyneuropathy: Secondary | ICD-10-CM | POA: Diagnosis not present

## 2016-08-29 DIAGNOSIS — E782 Mixed hyperlipidemia: Secondary | ICD-10-CM | POA: Diagnosis not present

## 2016-09-26 DIAGNOSIS — I2510 Atherosclerotic heart disease of native coronary artery without angina pectoris: Secondary | ICD-10-CM | POA: Diagnosis not present

## 2016-09-26 DIAGNOSIS — I255 Ischemic cardiomyopathy: Secondary | ICD-10-CM | POA: Diagnosis not present

## 2016-11-27 DIAGNOSIS — M5011 Cervical disc disorder with radiculopathy,  high cervical region: Secondary | ICD-10-CM | POA: Diagnosis not present

## 2016-11-27 DIAGNOSIS — E1142 Type 2 diabetes mellitus with diabetic polyneuropathy: Secondary | ICD-10-CM | POA: Diagnosis not present

## 2016-11-27 DIAGNOSIS — I10 Essential (primary) hypertension: Secondary | ICD-10-CM | POA: Diagnosis not present

## 2016-11-27 DIAGNOSIS — M545 Low back pain: Secondary | ICD-10-CM | POA: Diagnosis not present

## 2016-12-01 DIAGNOSIS — M1380 Other specified arthritis, unspecified site: Secondary | ICD-10-CM | POA: Diagnosis not present

## 2016-12-01 DIAGNOSIS — T63301A Toxic effect of unspecified spider venom, accidental (unintentional), initial encounter: Secondary | ICD-10-CM | POA: Diagnosis not present

## 2016-12-01 DIAGNOSIS — Z79899 Other long term (current) drug therapy: Secondary | ICD-10-CM | POA: Diagnosis not present

## 2017-01-20 ENCOUNTER — Encounter (HOSPITAL_COMMUNITY): Payer: Self-pay | Admitting: Emergency Medicine

## 2017-01-20 DIAGNOSIS — Z5321 Procedure and treatment not carried out due to patient leaving prior to being seen by health care provider: Secondary | ICD-10-CM | POA: Insufficient documentation

## 2017-01-20 DIAGNOSIS — R031 Nonspecific low blood-pressure reading: Secondary | ICD-10-CM | POA: Diagnosis present

## 2017-01-20 LAB — CBC
HEMATOCRIT: 37.6 % — AB (ref 39.0–52.0)
Hemoglobin: 13.7 g/dL (ref 13.0–17.0)
MCH: 33.3 pg (ref 26.0–34.0)
MCHC: 36.4 g/dL — AB (ref 30.0–36.0)
MCV: 91.3 fL (ref 78.0–100.0)
PLATELETS: 175 10*3/uL (ref 150–400)
RBC: 4.12 MIL/uL — ABNORMAL LOW (ref 4.22–5.81)
RDW: 13.9 % (ref 11.5–15.5)
WBC: 5.9 10*3/uL (ref 4.0–10.5)

## 2017-01-20 LAB — BASIC METABOLIC PANEL
Anion gap: 10 (ref 5–15)
BUN: 20 mg/dL (ref 6–20)
CHLORIDE: 99 mmol/L — AB (ref 101–111)
CO2: 23 mmol/L (ref 22–32)
CREATININE: 1.05 mg/dL (ref 0.61–1.24)
Calcium: 9.9 mg/dL (ref 8.9–10.3)
GFR calc Af Amer: >60 mL/min (ref >60)
GFR calc non Af Amer: >60 mL/min (ref >60)
GLUCOSE: 359 mg/dL — AB (ref 65–99)
POTASSIUM: 3.8 mmol/L (ref 3.5–5.1)
SODIUM: 132 mmol/L — AB (ref 135–145)

## 2017-01-20 LAB — I-STAT TROPONIN, ED: Troponin i, poc: 0 ng/mL (ref 0.00–0.08)

## 2017-01-20 NOTE — ED Triage Notes (Signed)
Patient arrives with complaint of dizziness and low blood pressure after going for a walk today. States that he felt fine yesterday and today until he wen for the walk. States wife checked his BP at home and found it to be 78/40. Denies pain but states he feels "the slightest bit of pressure in chest".

## 2017-01-21 ENCOUNTER — Emergency Department (HOSPITAL_COMMUNITY)
Admission: EM | Admit: 2017-01-21 | Discharge: 2017-01-21 | Disposition: A | Discharge status: Home / Self Care | Payer: BLUE CROSS/BLUE SHIELD | Attending: Emergency Medicine | Admitting: Emergency Medicine

## 2017-02-13 DIAGNOSIS — I255 Ischemic cardiomyopathy: Secondary | ICD-10-CM | POA: Diagnosis not present

## 2017-02-13 DIAGNOSIS — E784 Other hyperlipidemia: Secondary | ICD-10-CM | POA: Diagnosis not present

## 2017-02-13 DIAGNOSIS — I2510 Atherosclerotic heart disease of native coronary artery without angina pectoris: Secondary | ICD-10-CM | POA: Diagnosis not present

## 2017-07-03 ENCOUNTER — Other Ambulatory Visit: Payer: Self-pay | Admitting: Nurse Practitioner

## 2017-07-03 DIAGNOSIS — M545 Low back pain: Principal | ICD-10-CM

## 2017-07-03 DIAGNOSIS — G8929 Other chronic pain: Secondary | ICD-10-CM

## 2017-07-03 MED ORDER — TIZANIDINE HCL 4 MG PO TABS: 4.0000 mg | ORAL_TABLET | Freq: Three times a day (TID) | ORAL | 0 refills | Status: DC | PRN

## 2017-07-03 NOTE — Progress Notes (Signed)
Renewed rx for tizanidine - gave #60 with no refills. Has been more than 6 months since he was seen.

## 2017-07-04 ENCOUNTER — Other Ambulatory Visit: Payer: Self-pay

## 2017-07-04 DIAGNOSIS — G8929 Other chronic pain: Secondary | ICD-10-CM

## 2017-07-04 DIAGNOSIS — M545 Low back pain: Principal | ICD-10-CM

## 2017-07-04 MED ORDER — TIZANIDINE HCL 4 MG PO TABS: 4.0000 mg | ORAL_TABLET | Freq: Three times a day (TID) | ORAL | 0 refills | Status: DC | PRN

## 2017-07-05 ENCOUNTER — Other Ambulatory Visit: Payer: Self-pay

## 2017-07-16 ENCOUNTER — Other Ambulatory Visit: Payer: Self-pay

## 2017-07-16 MED ORDER — BUPROPION HCL ER (XL) 150 MG PO TB24: 150.0000 mg | ORAL_TABLET | Freq: Every day | ORAL | 0 refills | Status: DC

## 2017-07-26 ENCOUNTER — Other Ambulatory Visit: Payer: Self-pay

## 2017-07-26 DIAGNOSIS — G8929 Other chronic pain: Secondary | ICD-10-CM

## 2017-07-26 DIAGNOSIS — M545 Low back pain: Principal | ICD-10-CM

## 2017-07-26 MED ORDER — TIZANIDINE HCL 4 MG PO TABS: 4.0000 mg | ORAL_TABLET | Freq: Three times a day (TID) | ORAL | 2 refills | Status: DC | PRN

## 2017-08-16 DIAGNOSIS — E7849 Other hyperlipidemia: Secondary | ICD-10-CM | POA: Diagnosis not present

## 2017-08-16 DIAGNOSIS — I2510 Atherosclerotic heart disease of native coronary artery without angina pectoris: Secondary | ICD-10-CM | POA: Diagnosis not present

## 2017-08-16 DIAGNOSIS — I255 Ischemic cardiomyopathy: Secondary | ICD-10-CM | POA: Diagnosis not present

## 2017-09-21 DIAGNOSIS — K7460 Unspecified cirrhosis of liver: Secondary | ICD-10-CM | POA: Diagnosis not present

## 2017-10-15 ENCOUNTER — Other Ambulatory Visit: Payer: Self-pay

## 2017-10-15 DIAGNOSIS — M545 Low back pain: Principal | ICD-10-CM

## 2017-10-15 DIAGNOSIS — G8929 Other chronic pain: Secondary | ICD-10-CM

## 2017-10-15 MED ORDER — GLIMEPIRIDE 2 MG PO TABS: 2.0000 mg | ORAL_TABLET | Freq: Two times a day (BID) | ORAL | 0 refills | Status: DC

## 2017-10-15 MED ORDER — TIZANIDINE HCL 4 MG PO TABS: 4.0000 mg | ORAL_TABLET | Freq: Three times a day (TID) | ORAL | 0 refills | Status: DC | PRN

## 2017-10-17 ENCOUNTER — Other Ambulatory Visit: Payer: Self-pay

## 2017-10-17 MED ORDER — GLIMEPIRIDE 2 MG PO TABS: ORAL_TABLET | ORAL | 0 refills | Status: DC

## 2017-10-26 ENCOUNTER — Encounter: Payer: Self-pay | Admitting: Nurse Practitioner

## 2017-10-26 ENCOUNTER — Ambulatory Visit (INDEPENDENT_AMBULATORY_CARE_PROVIDER_SITE_OTHER): Payer: BLUE CROSS/BLUE SHIELD | Admitting: Nurse Practitioner

## 2017-10-26 VITALS — BP 116/78 | HR 90 | Resp 16 | Ht 67.0 in | Wt 173.8 lb

## 2017-10-26 DIAGNOSIS — E1142 Type 2 diabetes mellitus with diabetic polyneuropathy: Secondary | ICD-10-CM | POA: Diagnosis not present

## 2017-10-26 DIAGNOSIS — I10 Essential (primary) hypertension: Secondary | ICD-10-CM

## 2017-10-26 DIAGNOSIS — F32A Depression, unspecified: Secondary | ICD-10-CM

## 2017-10-26 DIAGNOSIS — K7460 Unspecified cirrhosis of liver: Secondary | ICD-10-CM

## 2017-10-26 DIAGNOSIS — K7581 Nonalcoholic steatohepatitis (NASH): Secondary | ICD-10-CM

## 2017-10-26 DIAGNOSIS — F329 Major depressive disorder, single episode, unspecified: Secondary | ICD-10-CM

## 2017-10-26 NOTE — Progress Notes (Signed)
[facility] [place], [place] [address]  Internal MEDICINE  Office Visit Note  Patient Name: Kristopher [name]  [id number]  [id number]  Date of Service: 11/19/2017   Pt is here for routine follow up.   Chief Complaint  Patient presents with  . Hypertension    The patient has been referred to endocrinology at [facility] per his liver specialist. He is being treated for non-alcoholic cirrhosis of the liver. Provider strongly recommended he be worked up by endocrinology. Saw them yesterday. HgbA1c was 8.0. He is on oral glimepiride now only. They are trying to ean him off injectables altogether.  Patient states that his son passed away in June 28, 2023. Does have some intermittent chest pain which he attributes to depression which is situational. He has reached out to grief counselor and follows up periodically, especially when these symptoms get severe.       Current Medication: Outpatient Encounter Medications as of 10/26/2017  Medication Sig  . aspirin EC 81 MG tablet Take 81 mg by mouth every morning.   Marland Kitchen buPROPion (WELLBUTRIN XL) 150 MG 24 hr tablet Take 1 tablet (150 mg total) by mouth daily with breakfast.  . clopidogrel (PLAVIX) 75 MG tablet Take 1 tablet (75 mg total) by mouth daily. Appointment needed for future refills (Patient taking differently: Take 75 mg by mouth every evening. Appointment needed for future refills)  . colchicine 0.6 MG tablet Take 1 tablet (0.6 mg total) by mouth daily.  Marland Kitchen diltiazem (CARDIZEM SR) 120 MG 12 hr capsule Take 120 mg by mouth 2 (two) times daily.  Marland Kitchen gabapentin (NEURONTIN) 300 MG capsule Take 300-600 mg by mouth 3 (three) times daily. Take 679m in the morning, 3055min the afternoon, and 60083mt bedtime.  . gMarland Kitchenimepiride (AMARYL) 2 MG tablet Take 1 tablet by mouth twice a day with food.  (Increase in dosing) (Patient taking differently: Take 4 mg by mouth 2 (two) times daily. Take 1 tablet by mouth twice a day with food.  (Increase in  dosing))  . HUMALOG KWIKPEN 200 UNIT/ML SOPN Inject 18-20 Units into the skin 2 (two) times daily as needed. If blood sugars are greater than 200  . ibuprofen (ADVIL,MOTRIN) 200 MG tablet Take 800 mg by mouth every 6 (six) hours as needed (pain).  . LErnest MallickEXTOUCH 100 UNIT/ML Pen Inject 34 Units into the skin 2 (two) times daily.  . nebivolol (BYSTOLIC) 5 MG tablet Take 1 tablet (5 mg total) by mouth daily.  . nitroGLYCERIN (NITROSTAT) 0.4 MG SL tablet Place 0.4 mg under the tongue every 5 (five) minutes as needed for chest pain.  . promethazine (PHENERGAN) 25 MG tablet Take 1 tablet (25 mg total) by mouth every 6 (six) hours as needed for nausea or vomiting.  . rMarland KitchenPINIRole (REQUIP XL) 2 MG 24 hr tablet Take 2 mg by mouth every evening.  . rosuvastatin (CRESTOR) 5 MG tablet Take 5 mg by mouth every evening.  . [DISCONTINUED] tiZANidine (ZANAFLEX) 4 MG tablet Take 1 tablet (4 mg total) by mouth every 8 (eight) hours as needed. For muscle spasms.   No facility-administered encounter medications on file as of 10/26/2017.     Surgical History: Past Surgical History:  Procedure Laterality Date  . APPENDECTOMY  ~ 1965  . ATRIAL FIBRILLATION ABLATION  2000   [facility] WITH CORONARY ANGIOGRAM N/A 10/07/2013   Procedure: LEFT AND RIGHT HEART CATHETERIZATION WITH CORONARY ANGIOGRAM;  Surgeon:  Troy Sine, MD;  Location: [facility] CATH LAB;  Service: Cardiovascular;  Laterality: N/A;  . LEFT HEART CATHETERIZATION WITH CORONARY ANGIOGRAM N/A 06/27/2013   Procedure: LEFT HEART CATHETERIZATION WITH CORONARY ANGIOGRAM;  Surgeon: Troy Sine, MD;  Location: [facility] CATH LAB;  Service: Cardiovascular;  Laterality: N/A;  . LEFT HEART CATHETERIZATION WITH CORONARY ANGIOGRAM N/A 07/10/2013   Procedure: LEFT HEART CATHETERIZATION WITH CORONARY ANGIOGRAM;  Surgeon: Burnell Blanks, MD;  Location: [facility] CATH LAB;  Service: Cardiovascular;  Laterality: N/A;  . LEFT HEART  CATHETERIZATION WITH CORONARY ANGIOGRAM N/A 08/28/2013   Procedure: LEFT HEART CATHETERIZATION WITH CORONARY ANGIOGRAM;  Surgeon: Troy Sine, MD;  Location: [facility] CATH LAB;  Service: Cardiovascular;  Laterality: N/A;  . PERCUTANEOUS CORONARY STENT INTERVENTION (PCI-S)  06/27/2013   Procedure: PERCUTANEOUS CORONARY STENT INTERVENTION (PCI-S);  Surgeon: Troy Sine, MD;  Location: [facility] CATH LAB;  Service: Cardiovascular;;  . POSTERIOR CERVICAL FUSION/FORAMINOTOMY  2009   fusion c3-t1  . PULMONARY VEIN ABLATION  2001   [facility]  . TONSILLECTOMY  1959    Medical History: Past Medical History:  Diagnosis Date  . Anginal pain ([place])   . Atrial fibrillation ([place])    a. s/p PVI X2 by Dr Omelia Blackwater at [facility] around 2000.  Marland Kitchen CAD (coronary artery disease)    a. NSTEMI (06/2013):  LHC (06/27/2013):  pLAD 10-20, RCA 90, EF 45-50%, inf HK.  PCI:  Xience Alpine (3 x 23 mm) DES to the RCA. b. Relook cath 07/10/13 - patent stent. Imdur added. c. Recurrent CP after that - abnormal nuc 07/2017 with chest pain, dyspnea, hypotensive response. Rest images with mid-basal inferior thinning. No stress images due to abrupt d/c of test. Relook cath 3/12 - no significant r  . Chronic back pain greater than 3 months duration   . Cirrhosis, non-alcoholic ([place])   . Depression   . DJD (degenerative joint disease) of cervical spine    "3 herniated discs" (09/01/2013)  . DJD (degenerative joint disease), lumbosacral    "bulging L5-S1" (09/01/2013)  . Herniated cervical disc    c2-T1 fusion  . HLD (hyperlipidemia)   . Ischemic cardiomyopathy    a. Echo (06/28/2011): Inferior and inferoseptal akinesis, EF 78%, grade 2 diastolic dysfunction, MAC, small effusion (no tamponade). B. EF 40% by echo 06/2013, cath 07/2013, 55% by cath 08/2013 with subtle mild mid inferior hypocontractility.  . Liver cirrhosis secondary to NASH ([place])   . NSTEMI (non-ST elevated myocardial infarction) ([place]) 06-27-2013  . Osteoarthritis    "neck and lower  back" (09/01/2013)  . Pericardial effusion    a. Chronic since hx of pericarditis in 2004. b. Mod by echo 06/2013, small by echo 06/2013.  Marland Kitchen Pneumonia ~ 2005  . Type II diabetes mellitus ([facility])     Family History: Family History  Problem Relation Age of Onset  . COPD Other   . Cancer Other   . Diabetes Other   . Heart disease Other   . CAD Neg Hx        No hx early CAD    Social History   Socioeconomic History  . Marital status: Married    Spouse name: Not on file  . Number of children: Not on file  . Years of education: Not on file  . Highest education level: Not on file  Occupational History  . Occupation: RESP     Employer: [employer]    Comment: Scientist, clinical (histocompatibility and immunogenetics) at [employer]  Social Needs  . Financial resource strain: Not on file  . Food insecurity:    Worry: Not on file    Inability: Not on file  . Transportation needs:    Medical: Not on file    Non-medical: Not on file  Tobacco Use  . Smoking status: Former Smoker    Packs/day: 1.50    Years: 43.00    Pack years: 64.50    Types: Cigarettes    Last attempt to quit: 05/06/2007    Years since quitting: 10.5  . Smokeless tobacco: Never Used  Substance and Sexual Activity  . Alcohol use: Yes    Comment: once monthly  . Drug use: No  . Sexual activity: Not Currently  Lifestyle  . Physical activity:    Days per week: Not on file    Minutes per session: Not on file  . Stress: Not on file  Relationships  . Social connections:    Talks on phone: Not on file    Gets together: Not on file    Attends religious service: Not on file    Active member of club or organization: Not on file    Attends meetings of clubs or organizations: Not on file    Relationship status: Not on file  . Intimate partner violence:    Fear of current or ex partner: Not on file    Emotionally abused: Not on file    Physically abused: Not on file    Forced sexual activity: Not on file  Other Topics Concern   . Not on file  Social History Narrative  . Not on file      Review of Systems  Constitutional: Negative for activity change, chills, fatigue and unexpected weight change.  HENT: Positive for postnasal drip. Negative for congestion, rhinorrhea, sneezing and sore throat.   Eyes: Negative for photophobia, pain, discharge, redness, itching and visual disturbance.  Respiratory: Negative for cough, chest tightness, shortness of breath and wheezing.   Cardiovascular: Negative for chest pain and palpitations.  Gastrointestinal: Negative for abdominal pain, constipation, diarrhea, nausea and vomiting.  Endocrine:       Blood sugars running high.   Genitourinary: Negative for dysuria and frequency.  Musculoskeletal: Negative for arthralgias, back pain, joint swelling and neck pain.  Skin: Negative for rash.  Allergic/Immunologic: Negative for environmental allergies.  Neurological: Negative for dizziness, tremors, numbness and headaches.  Hematological: Negative for adenopathy. Does not bruise/bleed easily.  Psychiatric/Behavioral: Positive for dysphoric mood. Negative for behavioral problems (Depression), sleep disturbance and suicidal ideas. The patient is nervous/anxious.        Moderate depression since the loss of his sone in December, 2018. Does speak to grief counselor periodically.    Today's Vitals   10/26/17 1416  BP: 116/78  Pulse: 90  Resp: 16  SpO2: 96%  Weight: 173 lb 12.8 oz (78.8 kg)  Height: _0  (1.702 m)    Physical Exam  Constitutional: He is oriented to person, place, and time. He appears well-developed and well-nourished. No distress.  HENT:  Head: Normocephalic and atraumatic.  Mouth/Throat: Oropharynx is clear and moist. No oropharyngeal exudate.  Eyes: Pupils are equal, round, and reactive to light. EOM are normal.  Neck: Normal range of motion. Neck supple. No JVD present. Carotid bruit is present. No tracheal deviation present. No thyromegaly present.   Cardiovascular: Normal rate, regular rhythm and normal heart sounds. Exam reveals no gallop and no friction rub.  No murmur heard. Pulmonary/Chest: Effort normal and breath  sounds normal. No respiratory distress. He has no wheezes. He has no rales. He exhibits no tenderness.  Abdominal: Soft. Bowel sounds are normal. There is no tenderness.  Musculoskeletal: Normal range of motion.  Lymphadenopathy:    He has no cervical adenopathy.  Neurological: He is alert and oriented to person, place, and time. No cranial nerve deficit.  Skin: Skin is warm and dry. He is not diaphoretic.  Psychiatric: He has a normal mood and affect. His behavior is normal. Judgment and thought content normal.  Nursing note and vitals reviewed.  Assessment/Plan: 1. Essential hypertension Stable. Continue bp medications as prescribed.  2. Type 2 diabetes mellitus with diabetic polyneuropathy, without long-term current use of insulin [facility]) Patient now seeing endocrinology. Medications being adjusted. Continue gabapentin as prescribed.   3. Liver cirrhosis secondary to NASH [facility]) Regular visits to GI as scheduled   4. Depression, unspecified depression type Continue bupropion as prescribed.   General Counseling: arber wiemers understanding of the findings of todays visit and agrees with plan of treatment. I have discussed any further diagnostic evaluation that may be needed or ordered today. We also reviewed his medications today. he has been encouraged to call the office with any questions or concerns that should arise related to todays visit.   This patient was seen by Leretha Pol, FNP- C in Collaboration with Dr Lavera Guise as a part of collaborative care agreement  Time spent: 107 Minutes     Dr Lavera Guise Internal medicine

## 2017-11-02 ENCOUNTER — Other Ambulatory Visit: Payer: Self-pay

## 2017-11-02 DIAGNOSIS — M545 Low back pain: Principal | ICD-10-CM

## 2017-11-02 DIAGNOSIS — G8929 Other chronic pain: Secondary | ICD-10-CM

## 2017-11-02 MED ORDER — TIZANIDINE HCL 4 MG PO TABS: 4.0000 mg | ORAL_TABLET | Freq: Three times a day (TID) | ORAL | 2 refills | Status: DC | PRN

## 2017-11-06 ENCOUNTER — Other Ambulatory Visit: Payer: Self-pay

## 2017-11-06 DIAGNOSIS — M545 Low back pain: Principal | ICD-10-CM

## 2017-11-06 DIAGNOSIS — G8929 Other chronic pain: Secondary | ICD-10-CM

## 2017-11-06 MED ORDER — TIZANIDINE HCL 4 MG PO TABS: 4.0000 mg | ORAL_TABLET | Freq: Three times a day (TID) | ORAL | 2 refills | Status: DC | PRN

## 2018-01-07 ENCOUNTER — Other Ambulatory Visit: Payer: Self-pay | Admitting: Nurse Practitioner

## 2018-01-07 DIAGNOSIS — M545 Low back pain: Principal | ICD-10-CM

## 2018-01-07 DIAGNOSIS — G8929 Other chronic pain: Secondary | ICD-10-CM

## 2018-01-07 MED ORDER — TIZANIDINE HCL 4 MG PO TABS: 4.0000 mg | ORAL_TABLET | Freq: Three times a day (TID) | ORAL | 2 refills | Status: DC | PRN

## 2018-01-14 ENCOUNTER — Other Ambulatory Visit: Payer: Self-pay

## 2018-01-14 MED ORDER — BUPROPION HCL ER (XL) 150 MG PO TB24: 150.0000 mg | ORAL_TABLET | Freq: Every day | ORAL | 1 refills | Status: DC

## 2018-03-21 ENCOUNTER — Other Ambulatory Visit: Payer: Self-pay

## 2018-04-02 ENCOUNTER — Other Ambulatory Visit: Payer: Self-pay

## 2018-04-02 DIAGNOSIS — M545 Low back pain, unspecified: Secondary | ICD-10-CM

## 2018-04-02 DIAGNOSIS — G8929 Other chronic pain: Secondary | ICD-10-CM

## 2018-04-02 MED ORDER — TIZANIDINE HCL 4 MG PO TABS: 4.0000 mg | ORAL_TABLET | Freq: Three times a day (TID) | ORAL | 2 refills | Status: DC | PRN

## 2018-04-29 ENCOUNTER — Encounter: Payer: Self-pay | Admitting: Nurse Practitioner

## 2018-04-29 ENCOUNTER — Ambulatory Visit (INDEPENDENT_AMBULATORY_CARE_PROVIDER_SITE_OTHER): Payer: BLUE CROSS/BLUE SHIELD | Admitting: Nurse Practitioner

## 2018-04-29 VITALS — BP 134/75 | HR 91 | Resp 16 | Ht 67.0 in | Wt 172.4 lb

## 2018-04-29 DIAGNOSIS — I482 Chronic atrial fibrillation, unspecified: Secondary | ICD-10-CM

## 2018-04-29 DIAGNOSIS — M62838 Other muscle spasm: Secondary | ICD-10-CM | POA: Diagnosis not present

## 2018-04-29 DIAGNOSIS — E1142 Type 2 diabetes mellitus with diabetic polyneuropathy: Secondary | ICD-10-CM

## 2018-04-29 DIAGNOSIS — R300 Dysuria: Secondary | ICD-10-CM

## 2018-04-29 DIAGNOSIS — Z0001 Encounter for general adult medical examination with abnormal findings: Secondary | ICD-10-CM | POA: Diagnosis not present

## 2018-04-29 MED ORDER — BACLOFEN 10 MG PO TABS: 10.0000 mg | ORAL_TABLET | Freq: Three times a day (TID) | ORAL | 3 refills | Status: DC

## 2018-04-29 NOTE — Progress Notes (Signed)
[facility] [place], [place] [address]  Internal MEDICINE  Office Visit Note  Patient Name: Kristopher [name]  [id number]  [id number]  Date of Service: 04/30/2018   Pt is here for routine health maintenance examination  Chief Complaint  Patient presents with  . Medicare Wellness    84monthwell visit. pt has a spot on his middle finger on the left hand that he is concerned about  . Diabetes  . Hyperlipidemia     The patient is here for health maintenance exam. He does have uncontrolled type 2 diabetes. He is followed by endocrinology. Medications were just changed from injectables back to oral medications. He states that he has noted improvement in his blood sugars. They are generally running in the mid to high 100s. He does need to have a diabetic eye exam.  He states that he feels good, overall. Has no concerns or complaints at this time. Denies chest pain, chest pressure, or shortness of breath.     Current Medication: Outpatient Encounter Medications as of 04/29/2018  Medication Sig  . aspirin EC 81 MG tablet Take 81 mg by mouth every morning.   .Marland KitchenbuPROPion (WELLBUTRIN XL) 150 MG 24 hr tablet Take 1 tablet (150 mg total) by mouth daily with breakfast.  . clopidogrel (PLAVIX) 75 MG tablet Take 1 tablet (75 mg total) by mouth daily. Appointment needed for future refills (Patient taking differently: Take 75 mg by mouth every evening. Appointment needed for future refills)  . colchicine 0.6 MG tablet Take 1 tablet (0.6 mg total) by mouth daily.  .Marland Kitchendiltiazem (CARDIZEM SR) 120 MG 12 hr capsule Take 120 mg by mouth 2 (two) times daily.  .Marland Kitchengabapentin (NEURONTIN) 300 MG capsule Take 300-600 mg by mouth 3 (three) times daily. Take 6057min the morning, 30062mn the afternoon, and 600m88m bedtime.  . glMarland Kitchenmepiride (AMARYL) 2 MG tablet Take 1 tablet by mouth twice a day with food.  (Increase in dosing) (Patient taking differently: Take 4 mg by mouth 2 (two) times  daily. Take 1 tablet by mouth twice a day with food.  (Increase in dosing))  . ibuprofen (ADVIL,MOTRIN) 200 MG tablet Take 800 mg by mouth every 6 (six) hours as needed (pain).  . JAMarland KitchenUMET XR 50-500 MG TB24 Take 1 tablet by mouth 2 (two) times daily.  . LEMarland KitchenEMIR FLEXTOUCH 100 UNIT/ML Pen Inject 34 Units into the skin 2 (two) times daily.  . nebivolol (BYSTOLIC) 5 MG tablet Take 1 tablet (5 mg total) by mouth daily.  . nitroGLYCERIN (NITROSTAT) 0.4 MG SL tablet Place 0.4 mg under the tongue every 5 (five) minutes as needed for chest pain.  . promethazine (PHENERGAN) 25 MG tablet Take 1 tablet (25 mg total) by mouth every 6 (six) hours as needed for nausea or vomiting.  . rOMarland KitchenINIRole (REQUIP XL) 2 MG 24 hr tablet Take 2 mg by mouth every evening.  . rosuvastatin (CRESTOR) 5 MG tablet Take 5 mg by mouth every evening.  . [DISCONTINUED] tiZANidine (ZANAFLEX) 4 MG tablet Take 1 tablet (4 mg total) by mouth every 8 (eight) hours as needed. For muscle spasms.  . baclofen (LIORESAL) 10 MG tablet Take 1 tablet (10 mg total) by mouth 3 (three) times daily.  . HUMarland KitchenALOG KWIKPEN 200 UNIT/ML SOPN Inject 18-20 Units into the skin 2 (two) times daily as needed. If blood sugars are greater than 200   No facility-administered encounter medications on file as of 04/29/2018.  Surgical History: Past Surgical History:  Procedure Laterality Date  . APPENDECTOMY  ~ 1965  . ATRIAL FIBRILLATION ABLATION  2000   Dr Omelia Blackwater at [facility]  . LEFT AND RIGHT HEART CATHETERIZATION WITH CORONARY ANGIOGRAM N/A 10/07/2013   Procedure: LEFT AND RIGHT HEART CATHETERIZATION WITH CORONARY ANGIOGRAM;  Surgeon: Troy Sine, MD;  Location: [facility] CATH LAB;  Service: Cardiovascular;  Laterality: N/A;  . LEFT HEART CATHETERIZATION WITH CORONARY ANGIOGRAM N/A 06/27/2013   Procedure: LEFT HEART CATHETERIZATION WITH CORONARY ANGIOGRAM;  Surgeon: Troy Sine, MD;  Location: [facility] CATH LAB;  Service: Cardiovascular;  Laterality: N/A;  . LEFT HEART  CATHETERIZATION WITH CORONARY ANGIOGRAM N/A 07/10/2013   Procedure: LEFT HEART CATHETERIZATION WITH CORONARY ANGIOGRAM;  Surgeon: Burnell Blanks, MD;  Location: [facility] CATH LAB;  Service: Cardiovascular;  Laterality: N/A;  . LEFT HEART CATHETERIZATION WITH CORONARY ANGIOGRAM N/A 08/28/2013   Procedure: LEFT HEART CATHETERIZATION WITH CORONARY ANGIOGRAM;  Surgeon: Troy Sine, MD;  Location: [facility] CATH LAB;  Service: Cardiovascular;  Laterality: N/A;  . PERCUTANEOUS CORONARY STENT INTERVENTION (PCI-S)  06/27/2013   Procedure: PERCUTANEOUS CORONARY STENT INTERVENTION (PCI-S);  Surgeon: Troy Sine, MD;  Location: [facility] CATH LAB;  Service: Cardiovascular;;  . POSTERIOR CERVICAL FUSION/FORAMINOTOMY  2009   fusion c3-t1  . PULMONARY VEIN ABLATION  2001   Dr Omelia Blackwater at [facility]  . TONSILLECTOMY  1959    Medical History: Past Medical History:  Diagnosis Date  . Anginal pain ([place])   . Atrial fibrillation ([place])    a. s/p PVI X2 by Dr Omelia Blackwater at [facility] around 2000.  Marland Kitchen CAD (coronary artery disease)    a. NSTEMI (06/2013):  LHC (06/27/2013):  pLAD 10-20, RCA 90, EF 45-50%, inf HK.  PCI:  Xience Alpine (3 x 23 mm) DES to the RCA. b. Relook cath 07/10/13 - patent stent. Imdur added. c. Recurrent CP after that - abnormal nuc 07/2017 with chest pain, dyspnea, hypotensive response. Rest images with mid-basal inferior thinning. No stress images due to abrupt d/c of test. Relook cath 3/12 - no significant r  . Chronic back pain greater than 3 months duration   . Cirrhosis, non-alcoholic ([place])   . Depression   . DJD (degenerative joint disease) of cervical spine    "3 herniated discs" (09/01/2013)  . DJD (degenerative joint disease), lumbosacral    "bulging L5-S1" (09/01/2013)  . Herniated cervical disc    c2-T1 fusion  . HLD (hyperlipidemia)   . Ischemic cardiomyopathy    a. Echo (06/28/2011): Inferior and inferoseptal akinesis, EF 67%, grade 2 diastolic dysfunction, MAC, small effusion (no tamponade). B. EF 40% by  echo 06/2013, cath 07/2013, 55% by cath 08/2013 with subtle mild mid inferior hypocontractility.  . Liver cirrhosis secondary to NASH ([place])   . NSTEMI (non-ST elevated myocardial infarction) ([place]) 06-27-2013  . Osteoarthritis    "neck and lower back" (09/01/2013)  . Pericardial effusion    a. Chronic since hx of pericarditis in 2004. b. Mod by echo 06/2013, small by echo 06/2013.  Marland Kitchen Pneumonia ~ 2005  . Type II diabetes mellitus (H[facility])     Family History: Family History  Problem Relation Age of Onset  . COPD Other   . Cancer Other   . Diabetes Other   . Heart disease Other   . CAD Neg Hx        No hx early CAD      Review of Systems  Constitutional: Negative for activity change, chills, fatigue and unexpected weight  change.  HENT: Negative for congestion, postnasal drip, rhinorrhea, sneezing and sore throat.   Eyes: Negative.   Respiratory: Negative for cough, chest tightness, shortness of breath and wheezing.   Cardiovascular: Negative for chest pain and palpitations.       Intermittent irregular heart rhythm.   Gastrointestinal: Negative for abdominal pain, constipation, diarrhea, nausea and vomiting.  Endocrine:       Uncontrolled type 2 diabetes, managed per endocrinology.   Genitourinary: Negative.  Negative for dysuria and frequency.  Musculoskeletal: Negative for arthralgias, back pain, joint swelling and neck pain.  Skin: Negative for rash.  Neurological: Negative for dizziness, tremors, numbness and headaches.  Hematological: Negative for adenopathy. Does not bruise/bleed easily.  Psychiatric/Behavioral: Negative for behavioral problems (Depression), sleep disturbance and suicidal ideas. The patient is not nervous/anxious.      Vital Signs: BP 134/75 (BP Location: Right Arm, Patient Position: Sitting, Cuff Size: Normal)   Pulse 91   Resp 16   Ht 5' 7"  (1.702 m)   Wt 172 lb 6.4 oz (78.2 kg)   SpO2 95%   BMI 27.00 kg/m    Physical Exam  Constitutional: He is  oriented to person, place, and time. He appears well-developed and well-nourished. No distress.  HENT:  Head: Normocephalic and atraumatic.  Nose: Nose normal.  Mouth/Throat: No oropharyngeal exudate.  Eyes: Pupils are equal, round, and reactive to light. Conjunctivae and EOM are normal.  Neck: Normal range of motion. Neck supple. No JVD present. No tracheal deviation present. No thyromegaly present.  Cardiovascular: Normal rate, regular rhythm, normal heart sounds and intact distal pulses. Exam reveals no gallop and no friction rub.  No murmur heard. Pulses:      Dorsalis pedis pulses are 2+ on the right side, and 2+ on the left side.       Posterior tibial pulses are 2+ on the right side, and 2+ on the left side.  Intermittent premature heart beat.   Pulmonary/Chest: Effort normal and breath sounds normal. No respiratory distress. He has no wheezes. He has no rales. He exhibits no tenderness.  Abdominal: Soft. Bowel sounds are normal. There is no tenderness.  Musculoskeletal: Normal range of motion.       Right foot: There is normal range of motion and no deformity.       Left foot: There is normal range of motion and no deformity.  Feet:  Right Foot:  Protective Sensation: 7 sites tested. 7 sites sensed.  Left Foot:  Protective Sensation: 7 sites tested. 7 sites sensed.  Lymphadenopathy:    He has no cervical adenopathy.  Neurological: He is alert and oriented to person, place, and time. No cranial nerve deficit.  Skin: Skin is warm and dry. He is not diaphoretic.  Psychiatric: He has a normal mood and affect. His behavior is normal. Judgment and thought content normal.  Nursing note and vitals reviewed.  Depression screen [facility] 2/9 04/29/2018 04/29/2018 10/26/2017 09/27/2013  Decreased Interest 0 0 1 0  Down, Depressed, Hopeless 0 0 1 -  PHQ - 2 Score 0 0 2 0  Altered sleeping - - 0 -  Tired, decreased energy - - 0 -  Change in appetite - - 0 -  Feeling bad or failure about  yourself  - - 0 -  Trouble concentrating - - 1 -  Moving slowly or fidgety/restless - - 0 -  Suicidal thoughts - - 0 -  PHQ-9 Score - - 3 -    Functional Status  Survey: Is the patient deaf or have difficulty hearing?: No Does the patient have difficulty seeing, even when wearing glasses/contacts?: No Does the patient have difficulty concentrating, remembering, or making decisions?: No Does the patient have difficulty walking or climbing stairs?: No Does the patient have difficulty dressing or bathing?: No Does the patient have difficulty doing errands alone such as visiting a doctor's office or shopping?: No  MMSE - [place] Exam 04/29/2018  Orientation to time 5  Orientation to Place 5  Registration 3  Attention/ Calculation 5  Recall 3  Language- name 2 objects 2  Language- repeat 1  Language- follow 3 step command 3  Language- read & follow direction 1  Write a sentence 1  Copy design 1  Total score 30    Fall Risk  04/29/2018 04/29/2018 10/26/2017 09/27/2013  Falls in the past year? 0 0 No No     LABS: Recent Results (from the past 2160 hour(s))  UA/M w/rflx Culture, Routine     Status: Abnormal   Collection Time: 04/29/18  3:29 PM  Result Value Ref Range   Specific Gravity, UA 1.012 1.005 - 1.030   pH, UA 6.0 5.0 - 7.5   Color, UA Yellow Yellow   Appearance Ur Clear Clear   Leukocytes, UA Negative Negative   Protein, UA Negative Negative/Trace   Glucose, UA 3+ (A) Negative   Ketones, UA Negative Negative   RBC, UA Negative Negative   Bilirubin, UA Negative Negative   Urobilinogen, Ur 0.2 0.2 - 1.0 mg/dL   Nitrite, UA Negative Negative   Microscopic Examination Comment     Comment: Microscopic follows if indicated.   Microscopic Examination See below:     Comment: Microscopic was indicated and was performed.   Urinalysis Reflex Comment     Comment: This specimen will not reflex to a Urine Culture.  Microscopic Examination     Status: None    Collection Time: 04/29/18  3:29 PM  Result Value Ref Range   WBC, UA None seen 0 - 5 /hpf   RBC, UA 0-2 0 - 2 /hpf   Epithelial Cells (non renal) None seen 0 - 10 /hpf   Casts None seen None seen /lpf   Bacteria, UA None seen None seen/Few   Assessment/Plan: 1. Encounter for general adult medical examination with abnormal findings Annual health maintenance exam today  2. Chronic atrial fibrillation Continue cardixem as prescribed. Regular visits with cardiology as scheduled.   3. Type 2 diabetes mellitus with diabetic polyneuropathy, without long-term current use of insulin [facility]) Patient followed by endocrinology. Referral made for diabetic eye exam.  - Ambulatory referral to Ophthalmology  4. Muscle spasm Patient having significant side effects from tizanidine. Discontinue this. Restart baclofen 96m which may taken two to three times per day.  - baclofen (LIORESAL) 10 MG tablet; Take 1 tablet (10 mg total) by mouth 3 (three) times daily.  Dispense: 90 each; Refill: 3  5. Dysuria - UA/M w/rflx Culture, Routine  General Counseling: Jalandis bluemelunderstanding of the findings of todays visit and agrees with plan of treatment. I have discussed any further diagnostic evaluation that may be needed or ordered today. We also reviewed his medications today. he has been encouraged to call the office with any questions or concerns that should arise related to todays visit.    Counseling:  Diabetes Counseling:  1. Addition of ACE inh/ ARB'S for nephroprotection. Microalbumin is updated  2. Diabetic foot care, prevention of complications. Podiatry  consult 3. Exercise and lose weight.  4. Diabetic eye examination, Diabetic eye exam is updated  5. Monitor blood sugar closlely. nutrition counseling.  6. Sign and symptoms of hypoglycemia including shaking sweating,confusion and headaches.  Cardiac risk factor modification:  1. Control blood pressure. 2. Exercise as prescribed. 3.  Follow low sodium, low fat diet. and low fat and low cholestrol diet. 4. Take ASA 69m once a day. 5. Restricted calories diet to lose weight.  This patient was seen by HLeretha PolFNP Collaboration with Dr FLavera Guiseas a part of collaborative care agreement  Orders Placed This Encounter  Procedures  . Microscopic Examination  . UA/M w/rflx Culture, Routine  . Ambulatory referral to Ophthalmology    Meds ordered this encounter  Medications  . baclofen (LIORESAL) 10 MG tablet    Sig: Take 1 tablet (10 mg total) by mouth 3 (three) times daily.    Dispense:  90 each    Refill:  3    Please d/c (hold) tizanidine.    Order Specific Question:   Supervising Provider    Answer:   KLavera Guise[[[id number]   Time spent: [employer] MD  Internal Medicine

## 2018-04-30 DIAGNOSIS — Z0001 Encounter for general adult medical examination with abnormal findings: Secondary | ICD-10-CM | POA: Insufficient documentation

## 2018-04-30 DIAGNOSIS — R300 Dysuria: Secondary | ICD-10-CM | POA: Insufficient documentation

## 2018-04-30 DIAGNOSIS — M62838 Other muscle spasm: Secondary | ICD-10-CM | POA: Insufficient documentation

## 2018-04-30 LAB — UA/M W/RFLX CULTURE, ROUTINE
Bilirubin, UA: NEGATIVE
KETONES UA: NEGATIVE
Leukocytes, UA: NEGATIVE
Nitrite, UA: NEGATIVE
PH UA: 6 (ref 5.0–7.5)
PROTEIN UA: NEGATIVE
RBC, UA: NEGATIVE
SPEC GRAV UA: 1.012 (ref 1.005–1.030)
Urobilinogen, Ur: 0.2 mg/dL (ref 0.2–1.0)

## 2018-04-30 LAB — MICROSCOPIC EXAMINATION
BACTERIA UA: NONE SEEN
CASTS: NONE SEEN /LPF
Epithelial Cells (non renal): NONE SEEN /hpf (ref 0–10)
WBC, UA: NONE SEEN /hpf (ref 0–5)

## 2018-07-24 ENCOUNTER — Other Ambulatory Visit: Payer: Self-pay

## 2018-07-24 MED ORDER — BUPROPION HCL ER (XL) 150 MG PO TB24: 150.0000 mg | ORAL_TABLET | Freq: Every day | ORAL | 1 refills | Status: DC

## 2018-09-17 ENCOUNTER — Other Ambulatory Visit: Payer: Self-pay

## 2018-09-17 DIAGNOSIS — M62838 Other muscle spasm: Secondary | ICD-10-CM

## 2018-09-17 MED ORDER — BACLOFEN 10 MG PO TABS: 10.0000 mg | ORAL_TABLET | Freq: Three times a day (TID) | ORAL | 1 refills | Status: DC

## 2018-10-16 ENCOUNTER — Other Ambulatory Visit: Payer: Self-pay

## 2018-10-16 DIAGNOSIS — M62838 Other muscle spasm: Secondary | ICD-10-CM

## 2018-10-16 MED ORDER — BACLOFEN 10 MG PO TABS: 10.0000 mg | ORAL_TABLET | Freq: Three times a day (TID) | ORAL | 0 refills | Status: DC

## 2018-10-28 ENCOUNTER — Ambulatory Visit: Payer: Self-pay | Admitting: Nurse Practitioner

## 2019-01-20 ENCOUNTER — Other Ambulatory Visit: Payer: Self-pay

## 2019-01-20 ENCOUNTER — Telehealth: Payer: Self-pay

## 2019-01-20 NOTE — Telephone Encounter (Signed)
Spoke with he is no longer pt for [place] [facility] due to his insurance

## 2019-06-19 ENCOUNTER — Other Ambulatory Visit: Payer: Self-pay

## 2019-06-19 DIAGNOSIS — Z20822 Contact with and (suspected) exposure to covid-19: Secondary | ICD-10-CM

## 2019-06-21 LAB — NOVEL CORONAVIRUS, NAA: SARS-CoV-2, NAA: NOT DETECTED

## 2019-08-11 ENCOUNTER — Other Ambulatory Visit: Payer: Self-pay | Admitting: Internal Medicine

## 2019-08-11 DIAGNOSIS — R1011 Right upper quadrant pain: Secondary | ICD-10-CM

## 2019-08-25 ENCOUNTER — Ambulatory Visit
Admission: RE | Admit: 2019-08-25 | Discharge: 2019-08-25 | Disposition: A | Discharge status: Home / Self Care | Payer: Medicare HMO | Source: Ambulatory Visit | Attending: Internal Medicine | Admitting: Internal Medicine

## 2019-08-25 DIAGNOSIS — R1011 Right upper quadrant pain: Secondary | ICD-10-CM

## 2019-09-18 ENCOUNTER — Other Ambulatory Visit: Payer: Self-pay

## 2019-09-18 ENCOUNTER — Observation Stay (HOSPITAL_COMMUNITY)
Admission: EM | Admit: 2019-09-18 | Discharge: 2019-09-19 | Disposition: A | Discharge status: Home / Self Care | Payer: Medicare HMO | Attending: Internal Medicine | Admitting: Internal Medicine

## 2019-09-18 ENCOUNTER — Emergency Department (HOSPITAL_COMMUNITY): Payer: Medicare HMO

## 2019-09-18 DIAGNOSIS — Z79899 Other long term (current) drug therapy: Secondary | ICD-10-CM | POA: Insufficient documentation

## 2019-09-18 DIAGNOSIS — E1142 Type 2 diabetes mellitus with diabetic polyneuropathy: Secondary | ICD-10-CM | POA: Diagnosis not present

## 2019-09-18 DIAGNOSIS — I509 Heart failure, unspecified: Secondary | ICD-10-CM | POA: Insufficient documentation

## 2019-09-18 DIAGNOSIS — Z87891 Personal history of nicotine dependence: Secondary | ICD-10-CM | POA: Insufficient documentation

## 2019-09-18 DIAGNOSIS — I480 Paroxysmal atrial fibrillation: Secondary | ICD-10-CM | POA: Insufficient documentation

## 2019-09-18 DIAGNOSIS — I6782 Cerebral ischemia: Secondary | ICD-10-CM | POA: Insufficient documentation

## 2019-09-18 DIAGNOSIS — E118 Type 2 diabetes mellitus with unspecified complications: Secondary | ICD-10-CM | POA: Insufficient documentation

## 2019-09-18 DIAGNOSIS — K2950 Unspecified chronic gastritis without bleeding: Secondary | ICD-10-CM | POA: Insufficient documentation

## 2019-09-18 DIAGNOSIS — K7581 Nonalcoholic steatohepatitis (NASH): Secondary | ICD-10-CM | POA: Insufficient documentation

## 2019-09-18 DIAGNOSIS — M1990 Unspecified osteoarthritis, unspecified site: Secondary | ICD-10-CM | POA: Insufficient documentation

## 2019-09-18 DIAGNOSIS — R410 Disorientation, unspecified: Secondary | ICD-10-CM | POA: Insufficient documentation

## 2019-09-18 DIAGNOSIS — R112 Nausea with vomiting, unspecified: Secondary | ICD-10-CM | POA: Insufficient documentation

## 2019-09-18 DIAGNOSIS — R413 Other amnesia: Secondary | ICD-10-CM | POA: Diagnosis present

## 2019-09-18 DIAGNOSIS — Z20822 Contact with and (suspected) exposure to covid-19: Secondary | ICD-10-CM | POA: Diagnosis not present

## 2019-09-18 DIAGNOSIS — I110 Hypertensive heart disease with heart failure: Secondary | ICD-10-CM | POA: Insufficient documentation

## 2019-09-18 DIAGNOSIS — G4733 Obstructive sleep apnea (adult) (pediatric): Secondary | ICD-10-CM | POA: Diagnosis not present

## 2019-09-18 DIAGNOSIS — G454 Transient global amnesia: Secondary | ICD-10-CM | POA: Insufficient documentation

## 2019-09-18 DIAGNOSIS — I498 Other specified cardiac arrhythmias: Secondary | ICD-10-CM | POA: Diagnosis present

## 2019-09-18 DIAGNOSIS — E7849 Other hyperlipidemia: Secondary | ICD-10-CM | POA: Diagnosis present

## 2019-09-18 DIAGNOSIS — R1011 Right upper quadrant pain: Secondary | ICD-10-CM | POA: Insufficient documentation

## 2019-09-18 DIAGNOSIS — I2510 Atherosclerotic heart disease of native coronary artery without angina pectoris: Secondary | ICD-10-CM | POA: Diagnosis not present

## 2019-09-18 DIAGNOSIS — Z885 Allergy status to narcotic agent status: Secondary | ICD-10-CM | POA: Insufficient documentation

## 2019-09-18 DIAGNOSIS — Z8249 Family history of ischemic heart disease and other diseases of the circulatory system: Secondary | ICD-10-CM | POA: Insufficient documentation

## 2019-09-18 DIAGNOSIS — Z7982 Long term (current) use of aspirin: Secondary | ICD-10-CM | POA: Insufficient documentation

## 2019-09-18 DIAGNOSIS — Z833 Family history of diabetes mellitus: Secondary | ICD-10-CM | POA: Insufficient documentation

## 2019-09-18 DIAGNOSIS — E119 Type 2 diabetes mellitus without complications: Secondary | ICD-10-CM

## 2019-09-18 DIAGNOSIS — I4891 Unspecified atrial fibrillation: Secondary | ICD-10-CM | POA: Diagnosis present

## 2019-09-18 DIAGNOSIS — Z955 Presence of coronary angioplasty implant and graft: Secondary | ICD-10-CM | POA: Insufficient documentation

## 2019-09-18 DIAGNOSIS — G2581 Restless legs syndrome: Secondary | ICD-10-CM | POA: Insufficient documentation

## 2019-09-18 DIAGNOSIS — Z83430 Family history of elevated lipoprotein(a): Secondary | ICD-10-CM | POA: Diagnosis not present

## 2019-09-18 DIAGNOSIS — I313 Pericardial effusion (noninflammatory): Secondary | ICD-10-CM | POA: Insufficient documentation

## 2019-09-18 DIAGNOSIS — Z7902 Long term (current) use of antithrombotics/antiplatelets: Secondary | ICD-10-CM | POA: Insufficient documentation

## 2019-09-18 DIAGNOSIS — F329 Major depressive disorder, single episode, unspecified: Secondary | ICD-10-CM | POA: Insufficient documentation

## 2019-09-18 DIAGNOSIS — R55 Syncope and collapse: Principal | ICD-10-CM | POA: Diagnosis present

## 2019-09-18 DIAGNOSIS — Z794 Long term (current) use of insulin: Secondary | ICD-10-CM | POA: Insufficient documentation

## 2019-09-18 DIAGNOSIS — Z825 Family history of asthma and other chronic lower respiratory diseases: Secondary | ICD-10-CM | POA: Insufficient documentation

## 2019-09-18 DIAGNOSIS — I252 Old myocardial infarction: Secondary | ICD-10-CM | POA: Diagnosis not present

## 2019-09-18 DIAGNOSIS — Z791 Long term (current) use of non-steroidal anti-inflammatories (NSAID): Secondary | ICD-10-CM | POA: Insufficient documentation

## 2019-09-18 DIAGNOSIS — Z809 Family history of malignant neoplasm, unspecified: Secondary | ICD-10-CM | POA: Insufficient documentation

## 2019-09-18 DIAGNOSIS — I255 Ischemic cardiomyopathy: Secondary | ICD-10-CM | POA: Diagnosis present

## 2019-09-18 DIAGNOSIS — Z886 Allergy status to analgesic agent status: Secondary | ICD-10-CM | POA: Insufficient documentation

## 2019-09-18 LAB — COMPREHENSIVE METABOLIC PANEL
ALT: 16 U/L (ref 0–44)
AST: 20 U/L (ref 15–41)
Albumin: 3.7 g/dL (ref 3.5–5.0)
Alkaline Phosphatase: 62 U/L (ref 38–126)
Anion gap: 12 (ref 5–15)
BUN: 24 mg/dL — ABNORMAL HIGH (ref 8–23)
CO2: 24 mmol/L (ref 22–32)
Calcium: 9.1 mg/dL (ref 8.9–10.3)
Chloride: 102 mmol/L (ref 98–111)
Creatinine, Ser: 1.11 mg/dL (ref 0.61–1.24)
GFR calc Af Amer: >60 mL/min (ref >60)
GFR calc non Af Amer: >60 mL/min (ref >60)
Glucose, Bld: 219 mg/dL — ABNORMAL HIGH (ref 70–99)
Potassium: 4 mmol/L (ref 3.5–5.1)
Sodium: 138 mmol/L (ref 135–145)
Total Bilirubin: 1.1 mg/dL (ref 0.3–1.2)
Total Protein: 6 g/dL — ABNORMAL LOW (ref 6.5–8.1)

## 2019-09-18 LAB — TROPONIN I (HIGH SENSITIVITY)
Troponin I (High Sensitivity): 5 ng/L (ref <18)
Troponin I (High Sensitivity): 4 ng/L (ref <18)

## 2019-09-18 LAB — URINALYSIS, ROUTINE W REFLEX MICROSCOPIC
Bilirubin Urine: NEGATIVE
Glucose, UA: >=500 mg/dL — AB
Ketones, ur: NEGATIVE mg/dL
Leukocytes,Ua: NEGATIVE
Nitrite: NEGATIVE
Protein, ur: NEGATIVE mg/dL
Specific Gravity, Urine: 1.006 (ref 1.005–1.030)
pH: 5 (ref 5.0–8.0)

## 2019-09-18 LAB — CBC WITH DIFFERENTIAL/PLATELET
Abs Immature Granulocytes: 0.02 10*3/uL (ref 0.00–0.07)
Basophils Absolute: 0 10*3/uL (ref 0.0–0.1)
Basophils Relative: 0 %
Eosinophils Absolute: 0.1 10*3/uL (ref 0.0–0.5)
Eosinophils Relative: 2 %
HCT: 33.9 % — ABNORMAL LOW (ref 39.0–52.0)
Hemoglobin: 11.3 g/dL — ABNORMAL LOW (ref 13.0–17.0)
Immature Granulocytes: 0 %
Lymphocytes Relative: 14 %
Lymphs Abs: 0.9 10*3/uL (ref 0.7–4.0)
MCH: 31.9 pg (ref 26.0–34.0)
MCHC: 33.3 g/dL (ref 30.0–36.0)
MCV: 95.8 fL (ref 80.0–100.0)
Monocytes Absolute: 0.5 10*3/uL (ref 0.1–1.0)
Monocytes Relative: 7 %
Neutro Abs: 5 10*3/uL (ref 1.7–7.7)
Neutrophils Relative %: 77 %
Platelets: 165 10*3/uL (ref 150–400)
RBC: 3.54 MIL/uL — ABNORMAL LOW (ref 4.22–5.81)
RDW: 15 % (ref 11.5–15.5)
WBC: 6.6 10*3/uL (ref 4.0–10.5)
nRBC: 0 % (ref 0.0–0.2)

## 2019-09-18 LAB — LACTIC ACID, PLASMA
Lactic Acid, Venous: 2.8 mmol/L (ref 0.5–1.9)
Lactic Acid, Venous: 2.4 mmol/L (ref 0.5–1.9)

## 2019-09-18 LAB — CBG MONITORING, ED: Glucose-Capillary: 193 mg/dL — ABNORMAL HIGH (ref 70–99)

## 2019-09-18 LAB — BRAIN NATRIURETIC PEPTIDE: B Natriuretic Peptide: 24.2 pg/mL (ref 0.0–100.0)

## 2019-09-18 LAB — MAGNESIUM: Magnesium: 1.7 mg/dL (ref 1.7–2.4)

## 2019-09-18 LAB — TSH: TSH: 1.21 u[IU]/mL (ref 0.350–4.500)

## 2019-09-18 LAB — LIPASE, BLOOD: Lipase: 32 U/L (ref 11–51)

## 2019-09-18 LAB — PROTIME-INR
INR: 0.9 (ref 0.8–1.2)
Prothrombin Time: 12.5 seconds (ref 11.4–15.2)

## 2019-09-18 MED ORDER — GADOBUTROL 1 MMOL/ML IV SOLN
7.0000 mL | Freq: Once | INTRAVENOUS | Status: AC | PRN
  Administered 2019-09-18: 7 mL via INTRAVENOUS

## 2019-09-18 MED ORDER — SODIUM CHLORIDE 0.9 % IV BOLUS
500.0000 mL | Freq: Once | INTRAVENOUS | Status: AC
  Administered 2019-09-18: 500 mL via INTRAVENOUS

## 2019-09-18 NOTE — H&P (Addendum)
Kristopher [name] PFY:[id number] DOB: [birth year] DOA: 09/18/2019     PCP: Townsend Roger, MD   Outpatient Specialists:  CARDS:  Dr. Roselie Skinner at [place] at [facility]  Patient arrived to ER on 09/18/19 at 1706  Patient coming from: home Lives  With family    Chief Complaint:   Chief Complaint  Patient presents with  . Loss of Consciousness    HPI: Kristopher [name] is a 69 y.o. male with medical history significant of CAD s/p NSTEMI s/p PCI of the RCA with DES, DM, ?OSA, atrial fibrillation s/p PVI, atrial flutter on flecainide s/p ablation, hyperlipidemia.  Prior transient global amnesia, Diffuse hepatic steatosis    Presented with syncopal event today patient was sitting in a couch got up to go to the restroom and became dizzy told his wife is about to pass out who came to his aid and help him to get to the floor so he would not hit his head he lost consciousness at that time no head injury no major fall he was pale diaphoretic he was unconscious for about 30 seconds she is sternal rub him and rolled onto his side, he vomitted, wife checked BP it was in 70-80's He has been eating well, he haS chronic gastritis and some chronic nausea, no chest pain When he woke up patient was slightly disoriented but this rapidly resolved No seizure-like activity 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 over the past few months Patient have had history of syncope in the past with vasovagal episodes as well as orthostasis No worsening lower extremity edema no chest pain or palpitations he feels somewhat weak Patient thought that he was eating okay no recent fevers or chills no cough  Had past episode of global amnesia Last year and may have had similar episode few days ago  Pt has intermittent light headed spells   Infectious risk factors:  Reports none   Has  been vaccinated against [place] first shot 08/26/2019    in house  PCR testing  Pending  Lab Results  Component  Value Date   [facility] Not Detected 06/19/2019     Regarding pertinent Chronic problems:    Hyperlipidemia -  on statins Crestor   HTN on diltiazem nebivolol  Chronic systolic CH  last [facility] [id number] showed preserved EF 50% up from 45%    CAD  - On Aspirin, statin, betablocker, Plavix                 -  followed by cardiology                - last cardiac cath 2017   DM 2 - on PO mdes      A. Fib -  Now resolved Sp PVI at [facility]  While in ER: Noted to be somewhat hypotensive blood pressures down 11-657 systolic No evidence of UTI on her UA BNP normal Initial troponin unremarkable Lactic acid mildly elevated No leukocytosis noted  Was given IV fluids   ER Provider Called: Neurology    Dr.Kirkpatrick They Recommend admit to medicine      Hospitalist was called for admission for syncope  The following Work up has been ordered so far:  Orders Placed This Encounter  Procedures  . Urine culture  . SARS CORONAVIRUS 2 (TAT 6-24 HRS) Nasopharyngeal Nasopharyngeal Swab  . DG Chest 2 View  . MR Brain W and Wo Contrast  . CBC  with Differential  . Comprehensive metabolic panel  . Lactic acid, plasma  . Lipase, blood  . Magnesium  . Protime-INR  . TSH  . Brain natriuretic peptide  . Urinalysis, Routine w reflex microscopic  . Consult for [place] Admission  ALL PATIENTS BEING ADMITTED/HAVING PROCEDURES NEED COVID-19 SCREENING  . CBG monitoring, ED  . EKG 12-Lead  . EEG adult   Following Medications were ordered in ER: Medications  sodium chloride 0.9 % bolus 500 mL (0 mLs Intravenous Stopped 09/18/19 2017)  gadobutrol (GADAVIST) 1 MMOL/ML injection 7 mL (7 mLs Intravenous Contrast Given 09/18/19 1935)    Significant initial  Findings: Abnormal Labs Reviewed  CBC WITH DIFFERENTIAL/PLATELET - Abnormal; Notable for the following components:      Result Value   RBC 3.54 (*)    Hemoglobin 11.3 (*)    HCT 33.9 (*)    All other components within normal  limits  COMPREHENSIVE METABOLIC PANEL - Abnormal; Notable for the following components:   Glucose, Bld 219 (*)    BUN 24 (*)    Total Protein 6.0 (*)    All other components within normal limits  LACTIC ACID, PLASMA - Abnormal; Notable for the following components:   Lactic Acid, Venous 2.8 (*)    All other components within normal limits  LACTIC ACID, PLASMA - Abnormal; Notable for the following components:   Lactic Acid, Venous 2.4 (*)    All other components within normal limits  URINALYSIS, ROUTINE W REFLEX MICROSCOPIC - Abnormal; Notable for the following components:   Color, Urine STRAW (*)    Glucose, UA >=500 (*)    Hgb urine dipstick MODERATE (*)    Bacteria, UA RARE (*)    All other components within normal limits  CBG MONITORING, ED - Abnormal; Notable for the following components:   Glucose-Capillary 193 (*)    All other components within normal limits     Otherwise labs showing:   Recent Labs  Lab 09/18/19 1812  NA 138  K 4.0  CO2 24  GLUCOSE 219*  BUN 24*  CREATININE 1.11  CALCIUM 9.1  MG 1.7    Cr  Up from baseline see below Lab Results  Component Value Date   CREATININE 1.11 09/18/2019   CREATININE 1.05 01/20/2017   CREATININE 0.57 (L) 01/01/2016    Recent Labs  Lab 09/18/19 1812  AST 20  ALT 16  ALKPHOS 62  BILITOT 1.1  PROT 6.0*  ALBUMIN 3.7   Lab Results  Component Value Date   CALCIUM 9.1 09/18/2019     WBC      Component Value Date/Time   WBC 6.6 09/18/2019 1812   ANC    Component Value Date/Time   NEUTROABS 5.0 09/18/2019 1812   ALC No components found for: LYMPHAB   Plt: Lab Results  Component Value Date   PLT 165 09/18/2019    Lactic Acid, Venous    Component Value Date/Time   LATICACIDVEN 2.4 (HH) 09/18/2019 2012   ABG    Component Value Date/Time   PHART 7.375 10/07/2013 0822   PCO2ART 43.9 10/07/2013 0822   PO2ART 74.0 (L) 10/07/2013 0822   HCO3 25.7 (H) 10/07/2013 0822   TCO2 24 03/20/2014 2251    O2SAT 94.0 10/07/2013 0822      HG/HCT  stable,       Component Value Date/Time   HGB 11.3 (L) 09/18/2019 1812   HCT 33.9 (L) 09/18/2019 1812    Recent Labs  Lab 09/18/19  1812  LIPASE 32   No results for input(s): AMMONIA in the last 168 hours.  No components found for: LABALBU   Troponin 3   ECG: Ordered Personally reviewed by me showing: HR :  85 Rhythm NSR  no evidence of ischemic changes QTC 462   BNP (last 3 results) Recent Labs    09/18/19 1812  BNP 24.2       CBG (last 3)  Recent Labs    09/18/19 1707  GLUCAP 193*       UA   no evidence of UTI     Urine analysis:    Component Value Date/Time   COLORURINE STRAW (A) 09/18/2019 2005   APPEARANCEUR CLEAR 09/18/2019 2005   APPEARANCEUR Clear 04/29/2018 1529   LABSPEC 1.006 09/18/2019 2005   PHURINE 5.0 09/18/2019 2005   GLUCOSEU >=500 (A) 09/18/2019 2005   HGBUR MODERATE (A) 09/18/2019 2005   BILIRUBINUR NEGATIVE 09/18/2019 2005   BILIRUBINUR Negative 04/29/2018 1529   KETONESUR NEGATIVE 09/18/2019 2005   PROTEINUR NEGATIVE 09/18/2019 2005   UROBILINOGEN 0.2 03/21/2014 0243   NITRITE NEGATIVE 09/18/2019 2005   LEUKOCYTESUR NEGATIVE 09/18/2019 2005   Ordered  CXR - NON acute  MRI unremarkable    ED Triage Vitals  Enc Vitals Group     BP 09/18/19 1712 102/68     Pulse Rate 09/18/19 1712 85     Resp 09/18/19 1712 17     Temp 09/18/19 1714 98.6 F (37 C)     Temp Source 09/18/19 1714 Oral     SpO2 09/18/19 1712 98 %     Weight 09/18/19 1713 167 lb 1.7 oz (75.8 kg)     Height 09/18/19 1713 _0  (1.702 m)     Head Circumference --      Peak Flow --      Pain Score 09/18/19 1713 0     Pain Loc --      Pain Edu? --      Excl. in [place]? --   TMAX(24)@       Latest  Blood pressure 112/76, pulse 81, temperature 98.6 F (37 C), temperature source Oral, resp. rate 15, height _1  (1.702 m), weight 75.8 kg, SpO2 95 %.     Review of Systems:    Pertinent positives include: syncope ,  vomiitng  Constitutional:  No weight loss, night sweats, Fevers, chills, fatigue, weight loss  HEENT:  No headaches, Difficulty swallowing,Tooth/dental problems,Sore throat,  No sneezing, itching, ear ache, nasal congestion, post nasal drip,  Cardio-vascular:  No chest pain, Orthopnea, PND, anasarca, dizziness, palpitations.no Bilateral lower extremity swelling  GI:  No heartburn, indigestion, abdominal pain, nausea, vomiting, diarrhea, change in bowel habits, loss of appetite, melena, blood in stool, hematemesis Resp:  no shortness of breath at rest. No dyspnea on exertion, No excess mucus, no productive cough, No non-productive cough, No coughing up of blood.No change in color of mucus.No wheezing. Skin:  no rash or lesions. No jaundice GU:  no dysuria, change in color of urine, no urgency or frequency. No straining to urinate.  No flank pain.  Musculoskeletal:  No joint pain or no joint swelling. No decreased range of motion. No back pain.  Psych:  No change in mood or affect. No depression or anxiety. No memory loss.  Neuro: no localizing neurological complaints, no tingling, no weakness, no double vision, no gait abnormality, no slurred speech, no confusion  All systems reviewed and apart from [place] all are negative  Past  Medical History:   Past Medical History:  Diagnosis Date  . Anginal pain ([place])   . Atrial fibrillation ([place])    a. s/p PVI X2 by Dr Omelia Blackwater at [facility] around 2000.  Marland Kitchen CAD (coronary artery disease)    a. NSTEMI (06/2013):  LHC (06/27/2013):  pLAD 10-20, RCA 90, EF 45-50%, inf HK.  PCI:  Xience Alpine (3 x 23 mm) DES to the RCA. b. Relook cath 07/10/13 - patent stent. Imdur added. c. Recurrent CP after that - abnormal nuc 07/2017 with chest pain, dyspnea, hypotensive response. Rest images with mid-basal inferior thinning. No stress images due to abrupt d/c of test. Relook cath 3/12 - no significant r  . Chronic back pain greater than 3 months duration   . Cirrhosis,  non-alcoholic ([place])   . Depression   . DJD (degenerative joint disease) of cervical spine    "3 herniated discs" (09/01/2013)  . DJD (degenerative joint disease), lumbosacral    "bulging L5-S1" (09/01/2013)  . Herniated cervical disc    c2-T1 fusion  . HLD (hyperlipidemia)   . Ischemic cardiomyopathy    a. Echo (06/28/2011): Inferior and inferoseptal akinesis, EF 00%, grade 2 diastolic dysfunction, MAC, small effusion (no tamponade). B. EF 40% by echo 06/2013, cath 07/2013, 55% by cath 08/2013 with subtle mild mid inferior hypocontractility.  . Liver cirrhosis secondary to NASH ([place])   . NSTEMI (non-ST elevated myocardial infarction) ([place]) 06-27-2013  . Osteoarthritis    "neck and lower back" (09/01/2013)  . Pericardial effusion    a. Chronic since hx of pericarditis in 2004. b. Mod by echo 06/2013, small by echo 06/2013.  Marland Kitchen Pneumonia ~ 2005  . Type II diabetes mellitus ([place])        Past Surgical History:  Procedure Laterality Date  . APPENDECTOMY  ~ 1965  . ATRIAL FIBRILLATION ABLATION  2000   Dr Omelia Blackwater at [facility]  . LEFT AND RIGHT HEART CATHETERIZATION WITH CORONARY ANGIOGRAM N/A 10/07/2013   Procedure: LEFT AND RIGHT HEART CATHETERIZATION WITH CORONARY ANGIOGRAM;  Surgeon: Troy Sine, MD;  Location: [facility] CATH LAB;  Service: Cardiovascular;  Laterality: N/A;  . LEFT HEART CATHETERIZATION WITH CORONARY ANGIOGRAM N/A 06/27/2013   Procedure: LEFT HEART CATHETERIZATION WITH CORONARY ANGIOGRAM;  Surgeon: Troy Sine, MD;  Location[facility] CATH LAB;  Service: Cardiovascular;  Laterality: N/A;  . LEFT HEART CATHETERIZATION WITH CORONARY ANGIOGRAM N/A 07/10/2013   Procedure: LEFT HEART CATHETERIZATION WITH CORONARY ANGIOGRAM;  Surgeon: Burnell Blanks, MD;  Location[facility] CATH LAB;  Service: Cardiovascular;  Laterality: N/A;  . LEFT HEART CATHETERIZATION WITH CORONARY ANGIOGRAM N/A 08/28/2013   Procedure: LEFT HEART CATHETERIZATION WITH CORONARY ANGIOGRAM;  Surgeon: Troy Sine, MD;  Location: [facility]  CATH LAB;  Service: Cardiovascular;  Laterality: N/A;  . PERCUTANEOUS CORONARY STENT INTERVENTION (PCI-S)  06/27/2013   Procedure: PERCUTANEOUS CORONARY STENT INTERVENTION (PCI-S);  Surgeon: Troy Sine, MD;  Location: [facility] CATH LAB;  Service: Cardiovascular;;  . POSTERIOR CERVICAL FUSION/FORAMINOTOMY  2009   fusion c3-t1  . PULMONARY VEIN ABLATION  2001   Dr Omelia Blackwater at [facility]  . TONSILLECTOMY  1959    Social History:  Ambulatory independently     reports that he quit smoking about 12 years ago. His smoking use included cigarettes. He has a 64.50 pack-year smoking history. He has never used smokeless tobacco. He reports current alcohol use. He reports that he does not use drugs.     Family History:  Family History  Problem Relation Age of  Onset  . COPD Other   . Cancer Other   . Diabetes Other   . Heart disease Other   . CAD Neg Hx        No hx early CAD    Allergies: Allergies  Allergen Reactions  . Codeine Nausea And Vomiting  . Fentanyl Nausea And Vomiting  . Naproxen Nausea Only  . Other Nausea And Vomiting    1. N/V to all opiates. He is able to take versed. (noted 09/29/13) 2.The patient states that he has had  a reaction to all narcotics. He can take them though. (noted 06/26/13)     Prior to Admission medications   Medication Sig Start Date End Date Taking? Authorizing Provider  aspirin EC 81 MG tablet Take 81 mg by mouth every evening.    Yes [provider]  baclofen (LIORESAL) 10 MG tablet Take 1 tablet (10 mg total) by mouth 3 (three) times daily. 10/16/18  Yes Boscia, Greer Ee, NP  buPROPion (WELLBUTRIN XL) 300 MG 24 hr tablet Take 300 mg by mouth daily. 08/08/19  Yes [provider]  clopidogrel (PLAVIX) 75 MG tablet Take 1 tablet (75 mg total) by mouth daily. Appointment needed for future refills Patient taking differently: Take 75 mg by mouth every evening. Appointment needed for future refills 05/17/15  Yes Troy Sine, MD  co-enzyme  Q-10 30 MG capsule Take 30 mg by mouth every evening.   Yes [provider]  diltiazem (CARDIZEM CD) 120 MG 24 hr capsule Take 120 mg by mouth daily. 08/20/19  Yes [provider]  gabapentin (NEURONTIN) 300 MG capsule Take 300 mg by mouth in the morning and at bedtime.    Yes [provider]  metFORMIN (GLUCOPHAGE-XR) 500 MG 24 hr tablet Take 1,000 mg by mouth 2 (two) times daily. 07/25/19  Yes [provider]  niacin 500 MG tablet Take 500 mg by mouth daily.   Yes [provider]  nitroGLYCERIN (NITROSTAT) 0.4 MG SL tablet Place 0.4 mg under the tongue every 5 (five) minutes as needed for chest pain.   Yes [provider]  pantoprazole (PROTONIX) 40 MG tablet Take 40 mg by mouth 2 (two) times daily.  08/08/19  Yes [provider]  pioglitazone (ACTOS) 15 MG tablet Take 15 mg by mouth daily. 07/14/19  Yes [provider]  promethazine (PHENERGAN) 12.5 MG tablet Take 12.5 mg by mouth every 6 (six) hours as needed for nausea or vomiting.  07/14/19  Yes [provider]  rosuvastatin (CRESTOR) 5 MG tablet Take 5 mg by mouth every evening. 10/07/15  Yes [provider]  buPROPion (WELLBUTRIN XL) 150 MG 24 hr tablet Take 1 tablet (150 mg total) by mouth daily with breakfast. Patient not taking: Reported on 09/18/2019 07/24/18   Ronnell Freshwater, NP  colchicine 0.6 MG tablet Take 1 tablet (0.6 mg total) by mouth daily. Patient not taking: Reported on 09/18/2019 03/23/14   Troy Sine, MD  glimepiride (AMARYL) 2 MG tablet Take 1 tablet by mouth twice a day with food.  (Increase in dosing) Patient not taking: Reported on 09/18/2019 10/17/17   Ronnell Freshwater, NP  nebivolol (BYSTOLIC) 5 MG tablet Take 1 tablet (5 mg total) by mouth daily. Patient not taking: Reported on 09/18/2019 03/12/14   Troy Sine, MD  promethazine (PHENERGAN) 25 MG tablet Take 1 tablet (25 mg total) by mouth every 6 (six) hours as needed for nausea or  vomiting. Patient not  taking: Reported on 09/18/2019 01/01/16   Comer Locket, PA-C   Physical Exam: Blood pressure 112/76, pulse 81, temperature 98.6 F (37 C), temperature source Oral, resp. rate 15, height _0  (1.702 m), weight 75.8 kg, SpO2 95 %. 1. General:  in No Acute distress  Chronically ill -appearing 2. Psychological: Alert and  Oriented 3. Head/ENT:     Dry Mucous Membranes                          Head Non traumatic, neck supple                          Poor Dentition 4. SKIN:   decreased Skin turgor,  Skin clean Dry and intact no rash 5. Heart: Regular rate and rhythm no Murmur, no Rub or gallop 6. Lungs:  Clear to auscultation bilaterally, no wheezes or crackles   7. Abdomen: Soft, non-tender, Non distended bowel sounds present 8. Lower extremities: no clubbing, cyanosis, no  edema 9. Neurologically  strength 5 out of 5 in all 4 extremities cranial nerves II through XII intact 10. MSK: Normal range of motion   All other LABS:     Recent Labs  Lab 09/18/19 1812  WBC 6.6  NEUTROABS 5.0  HGB 11.3*  HCT 33.9*  MCV 95.8  PLT 165     Recent Labs  Lab 09/18/19 1812  NA 138  K 4.0  CL 102  CO2 24  GLUCOSE 219*  BUN 24*  CREATININE 1.11  CALCIUM 9.1  MG 1.7     Recent Labs  Lab 09/18/19 1812  AST 20  ALT 16  ALKPHOS 62  BILITOT 1.1  PROT 6.0*  ALBUMIN 3.7       Cultures: No results found for: SDES, SPECREQUEST, CULT, REPTSTATUS   Radiological Exams on Admission: DG Chest 2 View  Result Date: 09/18/2019 CLINICAL DATA:  Syncope, history of CHF EXAM: CHEST - 2 VIEW COMPARISON:  12/20/2013 FINDINGS: Frontal and lateral views of the chest demonstrate a stable cardiac silhouette. No airspace disease, effusion, or pneumothorax. No acute bony abnormalities. IMPRESSION: 1. Stable chest, no acute process. Electronically Signed   By: Randa Ngo M.D.   On: 09/18/2019 18:57   MR Brain W and Wo Contrast  Result Date: 09/18/2019 CLINICAL DATA:  TIA.  Transient global amnesia. Syncope today. Recent weight loss. EXAM: MRI HEAD WITHOUT AND WITH CONTRAST TECHNIQUE: Multiplanar, multiecho pulse sequences of the brain and surrounding structures were obtained without and with intravenous contrast. CONTRAST:  59m GADAVIST GADOBUTROL 1 MMOL/ML IV SOLN COMPARISON:  03/21/2014 FINDINGS: Brain: Diffusion imaging does not show any acute or subacute infarction. No abnormality is seen affecting the brainstem or cerebellum. 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 No cortical or large vessel territory insult. No hippocampal region insult. No mass lesion, hemorrhage, hydrocephalus or extra-axial collection. After contrast administration, no abnormal enhancement occurs. Vascular: Major vessels at the base of the brain show flow. Skull and upper cervical spine: Negative Sinuses/Orbits: Clear/normal Other: None IMPRESSION: No acute or subacute finding by MRI. Specifically, no hippocampal lesion seen in this patient with a history of transient global amnesia. 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 Electronically Signed   By: MNelson ChimesM.D.   On: 09/18/2019 19:42    Chart has been reviewed    Assessment/Plan  69 y.o. male with medical history significant of CAD s/p NSTEMI s/p PCI of the RCA with DES, DM, ?OSA, atrial fibrillation s/p PVI, atrial flutter on flecainide s/p ablation, hyperlipidemia.  Prior transient global amnesia Admitted for syncope  Present on Admission: . Syncope -  given risk factor will admit rehydrate obtain CE, monitor on tele and obtain carotid dopplers, echo,  MRI head unremarkable .  Neurology recommendations EEG ordered   Check orthostatics prior to discharge will need to follow-up with [facility] with cardiology Given hypotension transient will hold diltiazem for the Given hypotension sudden onset of syncope check  a D-dimer   . Atrial fibrillation s/p PVI ablation at [facility] 2001-currently stable in sinus rhythm continue to monitor and   . Coronary artery disease involving native coronary artery of native heart without angina pectoris -continue home medications including Plavix and aspirin Patient no longer on beta-blocker   . Cardiomyopathy, ischemic currently appears to be slightly down on volume we will gently rehydrate   . Familial multiple lipoprotein-type hyperlipidemia -chronic stable continue home medications   . Other amnesia -history of intermittent amnestic episodes.  MRI unremarkable.  Per neurology recommendations or EEG if EEG unremarkable will need to follow up with Neurology as an outpatient  Elevated hemoglobin and urine without significant red blood cells.  Will check CK  Diabetes mellitus-  - Order Sensitive  SSI     -  check TSH and HgA1C  - Hold by mouth medications   Hypertension hold home medications for now given soft blood pressures initially  Other plan as per orders.  DVT prophylaxis:  SCD  Code Status:  FULL CODE  as per patient   I had personally discussed CODE STATUS with patient   Family Communication:   Family  at  Bedside  plan of care was discussed   With Wife,    Disposition Plan:   To home once workup is complete and patient is stable   Following barriers for discharge: Observation on telemetry to ensure no arrhythmia              Will need consultants to evaluate patient prior to discharge   [place] called: Neurology is aware  Admission status:  ED Disposition    ED Disposition Condition [place]: [place] [100100]  Level of Care: Telemetry Cardiac [103]  I expect the patient will be discharged within 24 hours: No (not a candidate for 5C-Observation unit)  Covid Evaluation: Asymptomatic Screening Protocol (No Symptoms)  Diagnosis: Syncope [206001]  Admitting  Physician: Toy Baker [[id number]]  Attending Physician: Toy Baker [[id number]]       Obs      Level of care    tele  For 12H    Precautions: admitted as  asymptomatic screening protocol    PPE: Used by the provider:   P100  eye Goggles,  Gloves    Sircharles Holzheimer 09/18/2019, 12:28 AM    [facility]     after 2 AM please page floor coverage PA If 7AM-7PM, please contact the day team taking care of the patient using [email or web address]   Patient was evaluated in the context of the global COVID-19 pandemic, which necessitated consideration that the patient might be at risk for infection with the SARS-CoV-2 virus that causes COVID-19. Institutional protocols and algorithms that pertain to  the evaluation of patients at risk for COVID-19 are in a state of rapid change based on information released by regulatory bodies including the CDC and federal and state organizations. These policies and algorithms were followed during the patient's care.

## 2019-09-18 NOTE — ED Notes (Signed)
Pt transported to XR via stretcher at this time.
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
## 2019-09-18 NOTE — ED Triage Notes (Signed)
Pt was sitting on the couch and got up to go to the restroom when he suddenly became dizzy and told his wife he was going to pass out, she came to his aid and helped him to the floor as he lost consciousness. Pt and wife denies any head injury or major fall. Pt's wife states he was pale and diaphoretic during the episode.

## 2019-09-19 ENCOUNTER — Observation Stay (HOSPITAL_BASED_OUTPATIENT_CLINIC_OR_DEPARTMENT_OTHER): Payer: Medicare HMO

## 2019-09-19 ENCOUNTER — Encounter (HOSPITAL_COMMUNITY): Payer: Self-pay | Admitting: Internal Medicine

## 2019-09-19 ENCOUNTER — Observation Stay (HOSPITAL_COMMUNITY): Payer: Medicare HMO

## 2019-09-19 DIAGNOSIS — I2510 Atherosclerotic heart disease of native coronary artery without angina pectoris: Secondary | ICD-10-CM | POA: Diagnosis not present

## 2019-09-19 DIAGNOSIS — R55 Syncope and collapse: Secondary | ICD-10-CM | POA: Diagnosis not present

## 2019-09-19 DIAGNOSIS — I4891 Unspecified atrial fibrillation: Secondary | ICD-10-CM

## 2019-09-19 LAB — CBC
HCT: 33.9 % — ABNORMAL LOW (ref 39.0–52.0)
Hemoglobin: 11.2 g/dL — ABNORMAL LOW (ref 13.0–17.0)
MCH: 31.8 pg (ref 26.0–34.0)
MCHC: 33 g/dL (ref 30.0–36.0)
MCV: 96.3 fL (ref 80.0–100.0)
Platelets: 154 10*3/uL (ref 150–400)
RBC: 3.52 MIL/uL — ABNORMAL LOW (ref 4.22–5.81)
RDW: 15.1 % (ref 11.5–15.5)
WBC: 4.9 10*3/uL (ref 4.0–10.5)
nRBC: 0 % (ref 0.0–0.2)

## 2019-09-19 LAB — HEMOGLOBIN A1C
Hgb A1c MFr Bld: 8.2 % — ABNORMAL HIGH (ref 4.8–5.6)
Mean Plasma Glucose: 188.64 mg/dL

## 2019-09-19 LAB — COMPREHENSIVE METABOLIC PANEL
ALT: 16 U/L (ref 0–44)
AST: 16 U/L (ref 15–41)
Albumin: 3.6 g/dL (ref 3.5–5.0)
Alkaline Phosphatase: 66 U/L (ref 38–126)
Anion gap: 10 (ref 5–15)
BUN: 16 mg/dL (ref 8–23)
CO2: 27 mmol/L (ref 22–32)
Calcium: 9.2 mg/dL (ref 8.9–10.3)
Chloride: 104 mmol/L (ref 98–111)
Creatinine, Ser: 0.96 mg/dL (ref 0.61–1.24)
GFR calc Af Amer: >60 mL/min (ref >60)
GFR calc non Af Amer: >60 mL/min (ref >60)
Glucose, Bld: 239 mg/dL — ABNORMAL HIGH (ref 70–99)
Potassium: 3.9 mmol/L (ref 3.5–5.1)
Sodium: 141 mmol/L (ref 135–145)
Total Bilirubin: 0.7 mg/dL (ref 0.3–1.2)
Total Protein: 6 g/dL — ABNORMAL LOW (ref 6.5–8.1)

## 2019-09-19 LAB — GLUCOSE, CAPILLARY
Glucose-Capillary: 132 mg/dL — ABNORMAL HIGH (ref 70–99)
Glucose-Capillary: 104 mg/dL — ABNORMAL HIGH (ref 70–99)
Glucose-Capillary: 188 mg/dL — ABNORMAL HIGH (ref 70–99)

## 2019-09-19 LAB — PHOSPHORUS: Phosphorus: 3.5 mg/dL (ref 2.5–4.6)

## 2019-09-19 LAB — SARS CORONAVIRUS 2 (TAT 6-24 HRS): SARS Coronavirus 2: NEGATIVE

## 2019-09-19 LAB — D-DIMER, QUANTITATIVE: D-Dimer, Quant: <0.27 ug/mL-FEU (ref 0.00–0.50)

## 2019-09-19 LAB — HIV ANTIBODY (ROUTINE TESTING W REFLEX): HIV Screen 4th Generation wRfx: NONREACTIVE

## 2019-09-19 LAB — CK: Total CK: 65 U/L (ref 49–397)

## 2019-09-19 LAB — ECHOCARDIOGRAM COMPLETE
Height: 67 in
Weight: 2539.2 oz

## 2019-09-19 LAB — LACTIC ACID, PLASMA: Lactic Acid, Venous: 2 mmol/L (ref 0.5–1.9)

## 2019-09-19 LAB — CBG MONITORING, ED: Glucose-Capillary: 292 mg/dL — ABNORMAL HIGH (ref 70–99)

## 2019-09-19 LAB — MAGNESIUM: Magnesium: 1.5 mg/dL — ABNORMAL LOW (ref 1.7–2.4)

## 2019-09-19 MED ORDER — ACETAMINOPHEN 650 MG RE SUPP: 650.0000 mg | Freq: Four times a day (QID) | RECTAL | Status: DC | PRN

## 2019-09-19 MED ORDER — ACETAMINOPHEN 325 MG PO TABS: 650.0000 mg | ORAL_TABLET | Freq: Four times a day (QID) | ORAL | Status: DC | PRN

## 2019-09-19 MED ORDER — CLOPIDOGREL BISULFATE 75 MG PO TABS: 75.0000 mg | ORAL_TABLET | Freq: Every evening | ORAL | Status: DC

## 2019-09-19 MED ORDER — ASPIRIN EC 81 MG PO TBEC: 81.0000 mg | DELAYED_RELEASE_TABLET | Freq: Every evening | ORAL | Status: DC

## 2019-09-19 MED ORDER — GABAPENTIN 100 MG PO CAPS: 300.0000 mg | ORAL_CAPSULE | Freq: Two times a day (BID) | ORAL | Status: DC

## 2019-09-19 MED ORDER — SODIUM CHLORIDE 0.9 % IV SOLN: INTRAVENOUS | Status: AC

## 2019-09-19 MED ORDER — PANTOPRAZOLE SODIUM 40 MG PO TBEC
40.0000 mg | DELAYED_RELEASE_TABLET | Freq: Two times a day (BID) | ORAL | Status: DC
  Administered 2019-09-19 (×2): 40 mg via ORAL
  Filled 2019-09-19 (×2): qty 1

## 2019-09-19 MED ORDER — ONDANSETRON HCL 4 MG PO TABS: 4.0000 mg | ORAL_TABLET | Freq: Four times a day (QID) | ORAL | Status: DC | PRN

## 2019-09-19 MED ORDER — BACLOFEN 10 MG PO TABS
10.0000 mg | ORAL_TABLET | Freq: Two times a day (BID) | ORAL | Status: DC
  Administered 2019-09-19 (×2): 10 mg via ORAL
  Filled 2019-09-19 (×3): qty 1

## 2019-09-19 MED ORDER — MAGNESIUM SULFATE 2 GM/50ML IV SOLN
2.0000 g | Freq: Once | INTRAVENOUS | Status: AC
  Administered 2019-09-19: 2 g via INTRAVENOUS
  Filled 2019-09-19: qty 50

## 2019-09-19 MED ORDER — INSULIN ASPART 100 UNIT/ML ~~LOC~~ SOLN
0.0000 [IU] | SUBCUTANEOUS | Status: DC
  Administered 2019-09-19: 02:00:00 5 [IU] via SUBCUTANEOUS
  Administered 2019-09-19: 1 [IU] via SUBCUTANEOUS

## 2019-09-19 MED ORDER — BUPROPION HCL ER (XL) 150 MG PO TB24
300.0000 mg | ORAL_TABLET | Freq: Every day | ORAL | Status: DC
  Administered 2019-09-19: 11:00:00 300 mg via ORAL
  Filled 2019-09-19: qty 2

## 2019-09-19 MED ORDER — ONDANSETRON HCL 4 MG/2ML IJ SOLN: 4.0000 mg | Freq: Four times a day (QID) | INTRAMUSCULAR | Status: DC | PRN

## 2019-09-19 MED ORDER — ROSUVASTATIN CALCIUM 5 MG PO TABS: 5.0000 mg | ORAL_TABLET | Freq: Every evening | ORAL | Status: DC

## 2019-09-19 MED ORDER — GABAPENTIN 300 MG PO CAPS
300.0000 mg | ORAL_CAPSULE | Freq: Two times a day (BID) | ORAL | Status: DC
  Administered 2019-09-19 (×2): 600 mg via ORAL
  Filled 2019-09-19 (×2): qty 2

## 2019-09-19 NOTE — Plan of Care (Signed)
  Problem: Health Behavior/Discharge Planning: Goal: Ability to manage health-related needs will improve Outcome: Adequate for Discharge   Problem: Clinical Measurements: Goal: Ability to maintain clinical measurements within normal limits will improve Outcome: Adequate for Discharge Goal: Will remain free from infection Outcome: Adequate for Discharge Goal: Diagnostic test results will improve Outcome: Adequate for Discharge Goal: Cardiovascular complication will be avoided Outcome: Adequate for Discharge   Problem: Pain Managment: Goal: General experience of comfort will improve Outcome: Adequate for Discharge   Problem: Safety: Goal: Ability to remain free from injury will improve Outcome: Adequate for Discharge

## 2019-09-19 NOTE — Discharge Summary (Addendum)
PATIENT DETAILS Name: Kristopher [name] Age: 69 y.o. Sex: male Date of Birth: May 14, 1951 MRN: [id number]. Admitting Physician: Toy Baker, MD PCP:Van Wendie Chess, MD  Admit Date: 09/18/2019 Discharge date: 09/19/2019  Recommendations for Outpatient Follow-up:  1. Follow up with PCP in 1-2 weeks 2. Please obtain CMP/CBC in one week 3. Follow-up with cardiology at [facility] in the next few weeks-if patient has not had a event monitor-consider placement of event monitor. 4. Follow-up with endocrinology-at [facility] for possible outpatient ACTH stimulation test  Admitted From:  Home  Disposition: [place]: No  Equipment/Devices: None  Discharge Condition: Stable  CODE STATUS: FULL CODE  Diet recommendation:  Diet Order            Diet Carb Modified Fluid consistency: Thin; Room service appropriate? 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 fib s/p ablation x2, prior 5 syncopal episodes-blood per patient has been worked up extensively in the past and has been attributed to vasovagal syncope-presented with a syncopal episode on day of admission.  See below for further details.  Brief Hospital Course: Syncope: Per patient-this occurred after he stood up from his couch-felt dizzy-and subsequently sustained a short syncopal episode.  Wife who is a respiratory therapist -checked his blood pressure right after this episode-systolic was in the 32T-55D range.  Patient also vomited once.  He was subsequently admitted to the hospitalist service-underwent MRI brain (negative for acute abnormalities) EEG (negative for seizures) and overnight telemetry monitoring (negative for arrhythmias).  An echocardiogram showed preserved EF.  D-dimer was negative.   Patient follows with cardiology at [facility] thinks he has had outpatient Holter/event monitoring-I have asked him to follow-up with his cardiologist for further work-up if deemed necessary.  Also note-patient spouse-indicated that her primary care practitioner wanted a ACTH stimulation  test-however patient has no electrolyte abnormalities-and after discussion with patient-he prefers this to be done in the outpatient setting-and does not want to stay another night to complete a a.m. ACTH stimulation test.  CAD: No anginal symptoms-Echo with preserved EF-telemetry negative.  Continue antiplatelet agents-and follow-up with PCP.  As noted above-if patient has not had a event monitor/Holter monitor-probably could consider placing 1 in the outpatient setting when he follows with cardiology.  PAF: Sinus rhythm.  No longer on flecainide.  Per history-he has had ablation x2.  He is maintained on low-dose diltiazem-given that he was hypotensive-and his blood pressure has been relatively stable-I have asked him to consider stopping it for a few days till he follows up with his primary cardiologist at [facility].  Pericardial effusion: Per patient this is a chronic issue-stable for continued follow-up in the outpatient setting by his primary cardiologist.   Discharge Diagnoses:  Active Problems:   Coronary artery disease involving native coronary artery of native heart without angina pectoris   Cardiomyopathy, ischemic   Familial multiple lipoprotein-type hyperlipidemia   Atrial fibrillation s/p PVI ablation at [facility] 2001   Diabetes mellitus, type 2   Other amnesia   Type 2 diabetes mellitus without complications ([place])   Syncope   Discharge Instructions:  Activity:  As tolerated   Discharge Instructions  Diet - low sodium heart healthy   Complete by: As directed    Diet Carb Modified   Complete by: As directed    Discharge instructions   Complete by: As directed     Please do not drive unless  cleared by outpatient physician. Use caution when using heavy equipment or power tools. Avoid working on ladders or at heights. Take showers instead of baths. Ensure the water temperature is not too high on the home water heater. Do not go swimming alone. When caring for infants or small children, sit down when holding, feeding, or changing them to minimize risk of injury to the child in the event you have a seizure.   Follow with Primary MD  Nona Dell, Corene Cornea, MD in 1-2 weeks  Follow-up with your primary cardiologist and primary endocrinologist at [facility] in the next 1-2 weeks.  Please get a complete blood count and chemistry panel checked by your Primary MD at your next visit, and again as instructed by your Primary MD.  Get Medicines reviewed and adjusted: Please take all your medications with you for your next visit with your Primary MD  Laboratory/radiological data: Please request your Primary MD to go over all hospital tests and procedure/radiological results at the follow up, please ask your Primary MD to get all Hospital records sent to his/her office.  In some cases, they will be blood work, cultures and biopsy results pending at the time of your discharge. Please request that your primary care M.D. follows up on these results.  Also Note the following: If you experience worsening of your admission symptoms, develop shortness of breath, life threatening emergency, suicidal or homicidal thoughts you must seek medical attention immediately by calling 911 or calling your MD immediately  if symptoms less severe.  You must read complete instructions/literature along with all the possible adverse reactions/side effects for all the Medicines you take and that have been prescribed to you. Take any new Medicines after you have completely understood and accpet all the possible adverse reactions/side effects.   Do not drive when taking Pain medications or sleeping medications (Benzodaizepines)  Do not  take more than prescribed Pain, Sleep and Anxiety Medications. It is not advisable to combine anxiety,sleep and pain medications without talking with your primary care practitioner  Special Instructions: If you have smoked or chewed Tobacco  in the last 2 yrs please stop smoking, stop any regular Alcohol  and or any Recreational drug use.  Wear Seat belts while driving.  Please note: You were cared for by a hospitalist during your hospital stay. Once you are discharged, your primary care physician will handle any further medical issues. Please note that NO REFILLS for any discharge medications will be authorized once you are discharged, as it is imperative that you return to your primary care physician (or establish a relationship with a primary care physician if you do not have one) for your post hospital discharge needs so that they can reassess your need for medications and monitor your lab values.   Increase activity slowly   Complete by: As directed      Allergies as of 09/19/2019      Reactions   Codeine Nausea And Vomiting   Fentanyl Nausea And Vomiting   Naproxen Nausea Only   Other Nausea And Vomiting   1. N/V to all opiates. He is able to take versed. (noted 09/29/13) 2.The patient states that he has had  a reaction to all narcotics. He can take  them though. (noted 06/26/13)      Medication List    STOP taking these medications   colchicine 0.6 MG tablet   diltiazem 120 MG 24 hr capsule Commonly known as: CARDIZEM CD   glimepiride 2 MG tablet Commonly known as: AMARYL   nebivolol 5 MG tablet Commonly known as: Bystolic     TAKE these medications   aspirin EC 81 MG tablet Take 81 mg by mouth every evening.   baclofen 10 MG tablet Commonly known as: LIORESAL Take 1 tablet (10 mg total) by mouth 3 (three) times daily.   buPROPion 300 MG 24 hr tablet Commonly known as: WELLBUTRIN XL Take 300 mg by mouth daily. What changed: Another medication with the same name was  removed. Continue taking this medication, and follow the directions you see here.   clopidogrel 75 MG tablet Commonly known as: PLAVIX Take 1 tablet (75 mg total) by mouth daily. Appointment needed for future refills What changed: when to take this   co-enzyme Q-10 30 MG capsule Take 30 mg by mouth every evening.   gabapentin 300 MG capsule Commonly known as: NEURONTIN Take 600 mg by mouth 4 (four) times daily.   metFORMIN 500 MG 24 hr tablet Commonly known as: GLUCOPHAGE-XR Take 1,000 mg by mouth 2 (two) times daily.   niacin 500 MG tablet Take 500 mg by mouth daily.   nitroGLYCERIN 0.4 MG SL tablet Commonly known as: NITROSTAT Place 0.4 mg under the tongue every 5 (five) minutes as needed for chest pain.   pantoprazole 40 MG tablet Commonly known as: PROTONIX Take 40 mg by mouth 2 (two) times daily.   pioglitazone 15 MG tablet Commonly known as: ACTOS Take 15 mg by mouth daily.   promethazine 12.5 MG tablet Commonly known as: PHENERGAN Take 12.5 mg by mouth every 6 (six) hours as needed for nausea or vomiting. What changed: Another medication with the same name was removed. Continue taking this medication, and follow the directions you see here.   rosuvastatin 5 MG tablet Commonly known as: CRESTOR Take 5 mg by mouth every evening.      Follow-up Information    Townsend Roger, MD.   Specialty: Internal Medicine Why: Please follow up in a week Contact information: [address] [place] [address] [phone]          Allergies  Allergen Reactions  . Codeine Nausea And Vomiting  . Fentanyl Nausea And Vomiting  . Naproxen Nausea Only  . Other Nausea And Vomiting    1. N/V to all opiates. He is able to take versed. (noted 09/29/13) 2.The patient states that he has had  a reaction to all narcotics. He can take them though. (noted 06/26/13)     Other Procedures/Studies: DG Chest 2 View  Result Date: 09/18/2019 CLINICAL DATA:  Syncope, history of  CHF EXAM: CHEST - 2 VIEW COMPARISON:  12/20/2013 FINDINGS: Frontal and lateral views of the chest demonstrate a stable cardiac silhouette. No airspace disease, effusion, or pneumothorax. No acute bony abnormalities. IMPRESSION: 1. Stable chest, no acute process. Electronically Signed   By: Randa Ngo M.D.   On: 09/18/2019 18:57   MR Brain W and Wo Contrast  Result Date: 09/18/2019 CLINICAL DATA:  TIA. Transient global amnesia. Syncope today. Recent weight loss. EXAM: MRI HEAD WITHOUT AND WITH CONTRAST TECHNIQUE: Multiplanar, multiecho pulse sequences of the brain and surrounding structures were obtained without and with intravenous contrast. CONTRAST:  33m GADAVIST GADOBUTROL 1  MMOL/ML IV SOLN COMPARISON:  03/21/2014 FINDINGS: Brain: Diffusion imaging does not show any acute or subacute infarction. No abnormality is seen affecting the brainstem or cerebellum. 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 No cortical or large vessel territory insult. No hippocampal region insult. No mass lesion, hemorrhage, hydrocephalus or extra-axial collection. After contrast administration, no abnormal enhancement occurs. Vascular: Major vessels at the base of the brain show flow. Skull and upper cervical spine: Negative Sinuses/Orbits: Clear/normal Other: None IMPRESSION: No acute or subacute finding by MRI. Specifically, no hippocampal lesion seen in this patient with a history of transient global amnesia. 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 Electronically Signed   By: Nelson Chimes M.D.   On: 09/18/2019 19:42   EEG adult  Result Date: 09/19/2019 Lora Havens, MD     09/19/2019  9:50 AM Patient Name: Kristopher [name] MRN: [id number] Epilepsy Attending: Lora Havens Referring Physician/Provider: Dr Toy Baker Date: 09/19/2019 Duration: 22.44 mins Patient history: 69yo M with prior history  of transient global amnesia presented with syncope. EEG to evaluate for seizure. Level of alertness: awake, asleep AEDs during EEG study: None Technical aspects: This EEG study was done with scalp electrodes positioned according to the 10-20 International system of electrode placement. Electrical activity was acquired at a sampling rate of 500Hz  and reviewed with a high frequency filter of 70Hz  and a low frequency filter of 1Hz . EEG data were recorded continuously and digitally stored. DESCRIPTION: The posterior dominant rhythm consists of 9 Hz activity of moderate voltage (25-35 uV) seen predominantly in posterior head regions, symmetric and reactive to eye opening and eye closing. Sleep was characterized by vertex waves, sleep spindles (12-14hz ), maximal frontocentral. Physiologic photic driving was seen during photic stimulation.       Hyperventilation was not performed. IMPRESSION: This study is within normal limits. No seizures or epileptiform discharges were seen throughout the recording. Lora Havens   ECHOCARDIOGRAM COMPLETE  Result Date: 09/19/2019    ECHOCARDIOGRAM REPORT   Patient Name:   JOLLY BLEICHER Date of Exam: 09/19/2019 Medical Rec #:  [id number]    Height:       67.0 in Accession #:    [id number]   Weight:       158.7 lb Date of Birth:  1951/04/06     BSA:          1.833 m Patient Age:    63 years     BP:           115/63 mmHg Patient Gender: M            HR:           83 bpm. Exam Location:  Inpatient Procedure: 2D Echo, Cardiac Doppler, Color Doppler and 3D Echo Indications:    R55 Syncope  History:        Patient has prior history of Echocardiogram examinations, most                 recent 09/02/2013. Cardiomyopathy, CAD and Ishemic                 cardiomyopathty, Abnormal ECG, Arrythmias:Atrial Fibrillation,                 Signs/Symptoms:Chest Pain and Syncope; Risk Factors:Diabetes.                 S/P ablation procedure. Pericardial effusion.  Sonographer:    Roseanna Rainbow RDCS Referring  Phys: [place]  Sonographer Comments: Technically difficult study due to poor echo windows. IMPRESSIONS  1. Normal LV systolic function; moderate LVH; moderate pericardial effusion; no significant respiratory variation and IVC not dilated and collapses.  2. Left ventricular ejection fraction, by estimation, is 55 to 60%. The left ventricle has normal function. The left ventricle has no regional wall motion abnormalities. There is moderate left ventricular hypertrophy. Left ventricular diastolic function  could not be evaluated.  3. Right ventricular systolic function is normal. The right ventricular size is normal.  4. Moderate pericardial effusion.  5. The mitral valve is normal in structure. Trivial mitral valve regurgitation. No evidence of mitral stenosis.  6. The aortic valve is tricuspid. Aortic valve regurgitation is not visualized. No aortic stenosis is present.  7. The inferior vena cava is normal in size with greater than 50% respiratory variability, suggesting right atrial pressure of 3 mmHg. FINDINGS  Left Ventricle: Left ventricular ejection fraction, by estimation, is 55 to 60%. The left ventricle has normal function. The left ventricle has no regional wall motion abnormalities. The left ventricular internal cavity size was normal in size. There is  moderate left ventricular hypertrophy. Left ventricular diastolic function could not be evaluated. Right Ventricle: The right ventricular size is normal. Right ventricular systolic function is normal. Left Atrium: Left atrial size was normal in size. Right Atrium: Right atrial size was normal in size. Pericardium: A moderately sized pericardial effusion is present. Mitral Valve: The mitral valve is normal in structure. Normal mobility of the mitral valve leaflets. Trivial mitral valve regurgitation. No evidence of mitral valve stenosis. Tricuspid Valve: The tricuspid valve is normal in structure. Tricuspid valve regurgitation is trivial. No  evidence of tricuspid stenosis. Aortic Valve: The aortic valve is tricuspid. Aortic valve regurgitation is not visualized. No aortic stenosis is present. Pulmonic Valve: The pulmonic valve was normal in structure. Pulmonic valve regurgitation is not visualized. No evidence of pulmonic stenosis. Aorta: The aortic root is normal in size and structure. Venous: The inferior vena cava is normal in size with greater than 50% respiratory variability, suggesting right atrial pressure of 3 mmHg.  Additional Comments: Normal LV systolic function; moderate LVH; moderate pericardial effusion; no significant respiratory variation and IVC not dilated and collapses.  LEFT VENTRICLE PLAX 2D LVIDd:         4.26 cm     Diastology LVIDs:         2.89 cm     LV e' lateral:   8.59 cm/s LV PW:         1.63 cm     LV E/e' lateral: 9.0 LV IVS:        1.40 cm     LV e' medial:    7.29 cm/s LVOT diam:     2.00 cm     LV E/e' medial:  10.6 LV SV:         66 LV SV Index:   36 LVOT Area:     3.14 cm  LV Volumes (MOD) LV vol d, MOD A2C: 86.9 ml LV vol d, MOD A4C: 77.9 ml LV vol s, MOD A2C: 38.9 ml LV vol s, MOD A4C: 30.6 ml LV SV MOD A2C:     48.0 ml LV SV MOD A4C:     77.9 ml LV SV MOD BP:      50.5 ml RIGHT VENTRICLE  IVC RV S prime:     15.20 cm/s  IVC diam: 1.87 cm TAPSE (M-mode): 2.1 cm LEFT ATRIUM             Index       RIGHT ATRIUM           Index LA diam:        3.30 cm 1.80 cm/m  RA Area:     10.50 cm LA Vol (A2C):   25.6 ml 13.97 ml/m RA Volume:   23.10 ml  12.60 ml/m LA Vol (A4C):   29.0 ml 15.82 ml/m LA Biplane Vol: 27.5 ml 15.00 ml/m  AORTIC VALVE LVOT Vmax:   97.30 cm/s LVOT Vmean:  76.800 cm/s LVOT VTI:    0.210 m  AORTA Ao Root diam: 3.40 cm MITRAL VALVE MV Area (PHT): 2.60 cm    SHUNTS MV Decel Time: 292 msec    Systemic VTI:  0.21 m MV E velocity: 77.10 cm/s  Systemic Diam: 2.00 cm MV A velocity: 71.30 cm/s MV E/A ratio:  1.08 Kirk Ruths MD Electronically signed by Kirk Ruths MD Signature Date/Time:  09/19/2019/12:51:52 PM    Final    US Abdomen Limited RUQ  Result Date: 08/25/2019 CLINICAL DATA:  Right upper quadrant abdominal pain. Nausea and vomiting for the past 2 months. History of NASH/cirrhosis. EXAM: ULTRASOUND ABDOMEN LIMITED RIGHT UPPER QUADRANT COMPARISON:  Complete abdomen ultrasound dated 01/01/2016. Chest CT dated 09/01/2013. FINDINGS: Gallbladder: No gallstones or wall thickening visualized. No sonographic Murphy sign noted by sonographer. Common bile duct: Diameter: 1.6 mm Liver: Prominent caudate lobe and lateral segment left lobe. Mildly diffusely echogenic with a mild interval increase in echogenicity compared to the ultrasound dated 01/01/2016. Corresponding low density on the previous chest CT. No visible mass. Portal vein is patent on color Doppler imaging with normal direction of blood flow towards the liver. Other: Small to moderate-sized pericardial effusion. No free peritoneal fluid. IMPRESSION: 1. Mildly diffusely echogenic liver with a mild increase in echogenicity since 2017. This is compatible with steatosis seen on the CT in 2015 with mild progression. Chronic hepatitis and cirrhosis can also have this appearance. 2. Prominent caudate lobe and lateral segment left lobe of the liver. This can be seen with cirrhosis. 3. No ascites. 4. Small to moderate-sized pericardial effusion. Electronically Signed   By: Claudie Revering M.D.   On: 08/25/2019 16:47     TODAY-DAY OF DISCHARGE:  Subjective:   Kristopher [name] today has no headache,no chest abdominal pain,no new weakness tingling or numbness, feels much better wants to go home today.   Objective:   Blood pressure 118/72, pulse 77, temperature (!) 97.4 F (36.3 C), temperature source Oral, resp. rate 17, height 5' 7"  (1.702 m), weight 72 kg, SpO2 97 %.  Intake/Output Summary (Last 24 hours) at 09/19/2019 1351 Last data filed at 09/19/2019 0529 Gross per 24 hour  Intake 88.05 ml  Output 550 ml  Net -461.95 ml   Filed Weights     09/18/19 1713 09/19/19 0618  Weight: 75.8 kg 72 kg    Exam: Awake Alert, Oriented *3, No new F.N deficits, Normal affect [place].AT,PERRAL Supple Neck,No JVD, No cervical lymphadenopathy appriciated.  Symmetrical Chest wall movement, Good air movement bilaterally, CTAB RRR,No Gallops,Rubs or new Murmurs, No Parasternal Heave +ve B.Sounds, Abd Soft, Non tender, No organomegaly appriciated, No rebound -guarding or rigidity. No Cyanosis, Clubbing or edema, No new Rash or bruise   PERTINENT RADIOLOGIC STUDIES: DG Chest 2 View  Result Date:  09/18/2019 CLINICAL DATA:  Syncope, history of CHF EXAM: CHEST - 2 VIEW COMPARISON:  12/20/2013 FINDINGS: Frontal and lateral views of the chest demonstrate a stable cardiac silhouette. No airspace disease, effusion, or pneumothorax. No acute bony abnormalities. IMPRESSION: 1. Stable chest, no acute process. Electronically Signed   By: Randa Ngo M.D.   On: 09/18/2019 18:57   MR Brain W and Wo Contrast  Result Date: 09/18/2019 CLINICAL DATA:  TIA. Transient global amnesia. Syncope today. Recent weight loss. EXAM: MRI HEAD WITHOUT AND WITH CONTRAST TECHNIQUE: Multiplanar, multiecho pulse sequences of the brain and surrounding structures were obtained without and with intravenous contrast. CONTRAST:  51m GADAVIST GADOBUTROL 1 MMOL/ML IV SOLN COMPARISON:  03/21/2014 FINDINGS: Brain: Diffusion imaging does not show any acute or subacute infarction. No abnormality is seen affecting the brainstem or cerebellum. 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 No cortical or large vessel territory insult. No hippocampal region insult. No mass lesion, hemorrhage, hydrocephalus or extra-axial collection. After contrast administration, no abnormal enhancement occurs. Vascular: Major vessels at the base of the brain show flow. Skull and upper cervical spine: Negative Sinuses/Orbits: Clear/normal Other: None  IMPRESSION: No acute or subacute finding by MRI. Specifically, no hippocampal lesion seen in this patient with a history of transient global amnesia. 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 Electronically Signed   By: MNelson ChimesM.D.   On: 09/18/2019 19:42   EEG adult  Result Date: 09/19/2019 YLora Havens MD     09/19/2019  9:50 AM Patient Name: Kristopher AWANMRN: [id number] Attending: PLora HavensReferring Physician/Provider: Dr AToy BakerDate: 09/19/2019 Duration: 22.44 mins Patient history: [age]yoM with prior history of transient global amnesia presented with syncope. EEG to evaluate for seizure. Level of alertness: awake, asleep AEDs during EEG study: None Technical aspects: This EEG study was done with scalp electrodes positioned according to the 10-20 International system of electrode placement. Electrical activity was acquired at a sampling rate of 500Hz  and reviewed with a high frequency filter of 70Hz  and a low frequency filter of 1Hz . EEG data were recorded continuously and digitally stored. DESCRIPTION: The posterior dominant rhythm consists of 9 Hz activity of moderate voltage (25-35 uV) seen predominantly in posterior head regions, symmetric and reactive to eye opening and eye closing. Sleep was characterized by vertex waves, sleep spindles (12-14hz ), maximal frontocentral. Physiologic photic driving was seen during photic stimulation.       Hyperventilation was not performed. IMPRESSION: This study is within normal limits. No seizures or epileptiform discharges were seen throughout the recording. PLora Havens  ECHOCARDIOGRAM COMPLETE  Result Date: 09/19/2019    ECHOCARDIOGRAM REPORT   Patient Name:   Kristopher VANDUNKDate of Exam: 09/19/2019 Medical Rec #:  [id number]   Height:       67.0 in Accession #:    [id number]  Weight:       158.7 lb Date of Birth:  7August 03, 1952    BSA:          1.833 m Patient Age:    89years      BP:           115/63 mmHg Patient Gender: M            HR:           83 bpm. Exam Location:  Inpatient Procedure: 2D Echo,  Cardiac Doppler, Color Doppler and 3D Echo Indications:    R55 Syncope  History:        Patient has prior history of Echocardiogram examinations, most                 recent 09/02/2013. Cardiomyopathy, CAD and Ishemic                 cardiomyopathty, Abnormal ECG, Arrythmias:Atrial Fibrillation,                 Signs/Symptoms:Chest Pain and Syncope; Risk Factors:Diabetes.                 S/P ablation procedure. Pericardial effusion.  Sonographer:    Roseanna Rainbow RDCS Referring Phys: [place]  Sonographer Comments: Technically difficult study due to poor echo windows. IMPRESSIONS  1. Normal LV systolic function; moderate LVH; moderate pericardial effusion; no significant respiratory variation and IVC not dilated and collapses.  2. Left ventricular ejection fraction, by estimation, is 55 to 60%. The left ventricle has normal function. The left ventricle has no regional wall motion abnormalities. There is moderate left ventricular hypertrophy. Left ventricular diastolic function  could not be evaluated.  3. Right ventricular systolic function is normal. The right ventricular size is normal.  4. Moderate pericardial effusion.  5. The mitral valve is normal in structure. Trivial mitral valve regurgitation. No evidence of mitral stenosis.  6. The aortic valve is tricuspid. Aortic valve regurgitation is not visualized. No aortic stenosis is present.  7. The inferior vena cava is normal in size with greater than 50% respiratory variability, suggesting right atrial pressure of 3 mmHg. FINDINGS  Left Ventricle: Left ventricular ejection fraction, by estimation, is 55 to 60%. The left ventricle has normal function. The left ventricle has no regional wall motion abnormalities. The left ventricular internal cavity size was normal in size. There is  moderate left ventricular hypertrophy. Left  ventricular diastolic function could not be evaluated. Right Ventricle: The right ventricular size is normal. Right ventricular systolic function is normal. Left Atrium: Left atrial size was normal in size. Right Atrium: Right atrial size was normal in size. Pericardium: A moderately sized pericardial effusion is present. Mitral Valve: The mitral valve is normal in structure. Normal mobility of the mitral valve leaflets. Trivial mitral valve regurgitation. No evidence of mitral valve stenosis. Tricuspid Valve: The tricuspid valve is normal in structure. Tricuspid valve regurgitation is trivial. No evidence of tricuspid stenosis. Aortic Valve: The aortic valve is tricuspid. Aortic valve regurgitation is not visualized. No aortic stenosis is present. Pulmonic Valve: The pulmonic valve was normal in structure. Pulmonic valve regurgitation is not visualized. No evidence of pulmonic stenosis. Aorta: The aortic root is normal in size and structure. Venous: The inferior vena cava is normal in size with greater than 50% respiratory variability, suggesting right atrial pressure of 3 mmHg.  Additional Comments: Normal LV systolic function; moderate LVH; moderate pericardial effusion; no significant respiratory variation and IVC not dilated and collapses.  LEFT VENTRICLE PLAX 2D LVIDd:         4.26 cm     Diastology LVIDs:         2.89 cm     LV e' lateral:   8.59 cm/s LV PW:         1.63 cm     LV E/e' lateral: 9.0 LV IVS:        1.40 cm     LV e' medial:    7.29  cm/s LVOT diam:     2.00 cm     LV E/e' medial:  10.6 LV SV:         66 LV SV Index:   36 LVOT Area:     3.14 cm  LV Volumes (MOD) LV vol d, MOD A2C: 86.9 ml LV vol d, MOD A4C: 77.9 ml LV vol s, MOD A2C: 38.9 ml LV vol s, MOD A4C: 30.6 ml LV SV MOD A2C:     48.0 ml LV SV MOD A4C:     77.9 ml LV SV MOD BP:      50.5 ml RIGHT VENTRICLE             IVC RV S prime:     15.20 cm/s  IVC diam: 1.87 cm TAPSE (M-mode): 2.1 cm LEFT ATRIUM             Index       RIGHT ATRIUM            Index LA diam:        3.30 cm 1.80 cm/m  RA Area:     10.50 cm LA Vol (A2C):   25.6 ml 13.97 ml/m RA Volume:   23.10 ml  12.60 ml/m LA Vol (A4C):   29.0 ml 15.82 ml/m LA Biplane Vol: 27.5 ml 15.00 ml/m  AORTIC VALVE LVOT Vmax:   97.30 cm/s LVOT Vmean:  76.800 cm/s LVOT VTI:    0.210 m  AORTA Ao Root diam: 3.40 cm MITRAL VALVE MV Area (PHT): 2.60 cm    SHUNTS MV Decel Time: 292 msec    Systemic VTI:  0.21 m MV E velocity: 77.10 cm/s  Systemic Diam: 2.00 cm MV A velocity: 71.30 cm/s MV E/A ratio:  1.08 Kirk Ruths MD Electronically signed by Kirk Ruths MD Signature Date/Time: 09/19/2019/12:51:52 PM    Final      PERTINENT LAB RESULTS: CBC: Recent Labs    09/18/19 1812 09/19/19 0302  WBC 6.6 4.9  HGB 11.3* 11.2*  HCT 33.9* 33.9*  PLT 165 154   CMET CMP     Component Value Date/Time   NA 141 09/19/2019 0302   K 3.9 09/19/2019 0302   CL 104 09/19/2019 0302   CO2 27 09/19/2019 0302   GLUCOSE 239 (H) 09/19/2019 0302   BUN 16 09/19/2019 0302   CREATININE 0.96 09/19/2019 0302   CREATININE 0.90 10/02/2013 0851   CALCIUM 9.2 09/19/2019 0302   PROT 6.0 (L) 09/19/2019 0302   ALBUMIN 3.6 09/19/2019 0302   AST 16 09/19/2019 0302   ALT 16 09/19/2019 0302   ALKPHOS 66 09/19/2019 0302   BILITOT 0.7 09/19/2019 0302   GFRNONAA >60 09/19/2019 0302   GFRAA >60 09/19/2019 0302    GFR Estimated Creatinine Clearance: 70.8 mL/min (by C-G formula based on SCr of 0.96 mg/dL). Recent Labs    09/18/19 1812  LIPASE 32   Recent Labs    09/19/19 0302  CKTOTAL 65   Invalid input(s): POCBNP Recent Labs    09/19/19 0302  DDIMER <0.27   Recent Labs    09/19/19 0302  HGBA1C 8.2*   No results for input(s): CHOL, HDL, LDLCALC, TRIG, CHOLHDL, LDLDIRECT in the last 72 hours. Recent Labs    09/18/19 1812  TSH 1.210   No results for input(s): VITAMINB12, FOLATE, FERRITIN, TIBC, IRON, RETICCTPCT in the last 72 hours. Coags: Recent Labs    09/18/19 1812  INR 0.9    Microbiology: Recent Results (from the past 240  hour(s))  SARS CORONAVIRUS 2 (TAT 6-24 HRS) Nasopharyngeal Nasopharyngeal Swab     Status: None   Collection Time: 09/18/19 10:22 PM   Specimen: Nasopharyngeal Swab  Result Value Ref Range Status   SARS Coronavirus 2 NEGATIVE NEGATIVE Final    Comment: (NOTE) SARS-CoV-2 target nucleic acids are NOT DETECTED. The SARS-CoV-2 RNA is generally detectable in upper and lower respiratory specimens during the acute phase of infection. Negative results do not preclude SARS-CoV-2 infection, do not rule out co-infections with other pathogens, and should not be used as the sole basis for treatment or other patient management decisions. Negative results must be combined with clinical observations, patient history, and epidemiological information. The expected result is Negative. Fact Sheet for Patients: SugarRoll.be Fact Sheet for Healthcare Providers: https://www.woods-mathews.com/ This test is not yet approved or cleared by the [place] FDA and  has been authorized for detection and/or diagnosis of SARS-CoV-2 by FDA under an Emergency Use Authorization (EUA). This EUA will remain  in effect (meaning this test can be used) for the duration of the COVID-19 declaration under Section 56 4(b)(1) of the Act, 21 U.S.C. section 360bbb-3(b)(1), unless the authorization is terminated or revoked sooner. Performed at [facility], [place] [address] [place], [place] [address]     FURTHER DISCHARGE INSTRUCTIONS:  Get Medicines reviewed and adjusted: Please take all your medications with you for your next visit with your Primary MD  Laboratory/radiological data: Please request your Primary MD to go over all hospital tests and procedure/radiological results at the follow up, please ask your Primary MD to get all Hospital records sent to his/her office.  In some cases, they will be blood work, cultures  and biopsy results pending at the time of your discharge. Please request that your primary care M.D. goes through all the records of your hospital data and follows up on these results.  Also Note the following: If you experience worsening of your admission symptoms, develop shortness of breath, life threatening emergency, suicidal or homicidal thoughts you must seek medical attention immediately by calling 911 or calling your MD immediately  if symptoms less severe.  You must read complete instructions/literature along with all the possible adverse reactions/side effects for all the Medicines you take and that have been prescribed to you. Take any new Medicines after you have completely understood and accpet all the possible adverse reactions/side effects.   Do not drive when taking Pain medications or sleeping medications (Benzodaizepines)  Do not take more than prescribed Pain, Sleep and Anxiety Medications. It is not advisable to combine anxiety,sleep and pain medications without talking with your primary care practitioner  Special Instructions: If you have smoked or chewed Tobacco  in the last 2 yrs please stop smoking, stop any regular Alcohol  and or any Recreational drug use.  Wear Seat belts while driving.  Please note: You were cared for by a hospitalist during your hospital stay. Once you are discharged, your primary care physician will handle any further medical issues. Please note that NO REFILLS for any discharge medications will be authorized once you are discharged, as it is imperative that you return to your primary care physician (or establish a relationship with a primary care physician if you do not have one) for your post hospital discharge needs so that they can reassess your need for medications and monitor your lab values.  Total Time spent coordinating discharge including counseling, education and face to face time equals 25  minutes.  Signed: Dispensing optician  Kristopher [name] 09/19/2019 1:51 PM

## 2019-09-19 NOTE — Care Management (Signed)
Pt deemed stable for discharge home today. CM reviewed chart for TOC needs/consults/orders - none found.  CM signing off

## 2019-09-19 NOTE — Progress Notes (Signed)
  Echocardiogram 2D Echocardiogram has been performed.  Kristopher [name] 09/19/2019, 11:06 AM

## 2019-09-19 NOTE — Procedures (Signed)
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 EEG to evaluate for seizure.  Level of alertness: awake, asleep  AEDs during EEG study: None  Technical aspects: This EEG study was done with scalp electrodes positioned according to the 10-20 International system of electrode placement. Electrical activity was acquired at a sampling rate of 500Hz  and reviewed with a high frequency filter of 70Hz  and a low frequency filter of 1Hz . EEG data were recorded continuously and digitally stored.   DESCRIPTION: The posterior dominant rhythm consists of 9 Hz activity of moderate voltage (25-35 uV) seen predominantly in posterior head regions, symmetric and reactive to eye opening and eye closing. Sleep was characterized by vertex waves, sleep spindles (12-14hz ), maximal frontocentral. Physiologic photic driving was seen during photic stimulation.        Hyperventilation was not performed.  IMPRESSION: This study is within normal limits. No seizures or epileptiform discharges were seen throughout the recording.  Faelynn Wynder Barbra Sarks

## 2019-09-19 NOTE — Plan of Care (Signed)
Alert and oriented X4. Transferred to the unit by ED staff. Pt has been oriented to the environment/Room. Skin warm and dry. Call bell within reach. No distress or discomfort noted at this moment. Will closely monitor the patient.

## 2019-09-19 NOTE — ED Notes (Signed)
Report given to Arsenio Loader, [place]. All questions answered

## 2019-09-19 NOTE — Progress Notes (Signed)
Portable EEG completed, results pending.

## 2019-09-19 NOTE — Progress Notes (Signed)
Seen and examined-feels better this morning.  BP stable.  He has had approximately 5 prior episodes of syncope-Per patient-he has been told this was due to vasovagal syncope.  Awaiting EEG and echo.  Suspect he is stable to be discharged home after work-up is complete later today.  Full note to follow.

## 2019-09-20 LAB — URINE CULTURE: Culture: NO GROWTH

## 2020-04-14 DIAGNOSIS — Z959 Presence of cardiac and vascular implant and graft, unspecified: Secondary | ICD-10-CM | POA: Diagnosis not present

## 2020-04-20 DIAGNOSIS — F4323 Adjustment disorder with mixed anxiety and depressed mood: Secondary | ICD-10-CM | POA: Diagnosis not present

## 2020-04-27 DIAGNOSIS — R55 Syncope and collapse: Secondary | ICD-10-CM | POA: Diagnosis not present

## 2020-04-27 DIAGNOSIS — I2510 Atherosclerotic heart disease of native coronary artery without angina pectoris: Secondary | ICD-10-CM | POA: Diagnosis not present

## 2020-04-27 DIAGNOSIS — E7849 Other hyperlipidemia: Secondary | ICD-10-CM | POA: Diagnosis not present

## 2020-05-03 DIAGNOSIS — F4323 Adjustment disorder with mixed anxiety and depressed mood: Secondary | ICD-10-CM | POA: Diagnosis not present

## 2020-05-17 DIAGNOSIS — Z959 Presence of cardiac and vascular implant and graft, unspecified: Secondary | ICD-10-CM | POA: Diagnosis not present

## 2020-05-17 DIAGNOSIS — G2581 Restless legs syndrome: Secondary | ICD-10-CM | POA: Diagnosis not present

## 2020-05-17 DIAGNOSIS — Z23 Encounter for immunization: Secondary | ICD-10-CM | POA: Diagnosis not present

## 2020-05-17 DIAGNOSIS — E1140 Type 2 diabetes mellitus with diabetic neuropathy, unspecified: Secondary | ICD-10-CM | POA: Diagnosis not present

## 2020-05-18 DIAGNOSIS — Z959 Presence of cardiac and vascular implant and graft, unspecified: Secondary | ICD-10-CM | POA: Diagnosis not present

## 2020-05-19 DIAGNOSIS — F411 Generalized anxiety disorder: Secondary | ICD-10-CM | POA: Diagnosis not present

## 2020-06-24 DIAGNOSIS — F411 Generalized anxiety disorder: Secondary | ICD-10-CM | POA: Diagnosis not present

## 2020-08-17 DIAGNOSIS — Z959 Presence of cardiac and vascular implant and graft, unspecified: Secondary | ICD-10-CM | POA: Diagnosis not present

## 2020-08-25 DIAGNOSIS — Z5189 Encounter for other specified aftercare: Secondary | ICD-10-CM | POA: Diagnosis not present

## 2020-08-25 DIAGNOSIS — E119 Type 2 diabetes mellitus without complications: Secondary | ICD-10-CM | POA: Diagnosis not present

## 2020-08-25 DIAGNOSIS — Z7984 Long term (current) use of oral hypoglycemic drugs: Secondary | ICD-10-CM | POA: Diagnosis not present

## 2020-09-09 DIAGNOSIS — Z959 Presence of cardiac and vascular implant and graft, unspecified: Secondary | ICD-10-CM | POA: Diagnosis not present

## 2020-10-04 DIAGNOSIS — H2513 Age-related nuclear cataract, bilateral: Secondary | ICD-10-CM | POA: Diagnosis not present

## 2020-10-04 DIAGNOSIS — H52222 Regular astigmatism, left eye: Secondary | ICD-10-CM | POA: Diagnosis not present

## 2020-10-04 DIAGNOSIS — E119 Type 2 diabetes mellitus without complications: Secondary | ICD-10-CM | POA: Diagnosis not present

## 2020-11-04 DIAGNOSIS — E1140 Type 2 diabetes mellitus with diabetic neuropathy, unspecified: Secondary | ICD-10-CM | POA: Diagnosis not present

## 2020-11-04 DIAGNOSIS — R112 Nausea with vomiting, unspecified: Secondary | ICD-10-CM | POA: Diagnosis not present

## 2020-11-18 DIAGNOSIS — N3091 Cystitis, unspecified with hematuria: Secondary | ICD-10-CM | POA: Diagnosis not present

## 2020-11-18 DIAGNOSIS — R310 Gross hematuria: Secondary | ICD-10-CM | POA: Diagnosis not present

## 2020-11-22 DIAGNOSIS — Z959 Presence of cardiac and vascular implant and graft, unspecified: Secondary | ICD-10-CM | POA: Diagnosis not present

## 2020-11-29 DIAGNOSIS — R319 Hematuria, unspecified: Secondary | ICD-10-CM | POA: Diagnosis not present

## 2020-11-29 DIAGNOSIS — D649 Anemia, unspecified: Secondary | ICD-10-CM | POA: Diagnosis not present

## 2020-12-09 DIAGNOSIS — D509 Iron deficiency anemia, unspecified: Secondary | ICD-10-CM | POA: Diagnosis not present

## 2020-12-14 DIAGNOSIS — Z9049 Acquired absence of other specified parts of digestive tract: Secondary | ICD-10-CM | POA: Diagnosis not present

## 2020-12-14 DIAGNOSIS — N200 Calculus of kidney: Secondary | ICD-10-CM | POA: Diagnosis not present

## 2020-12-14 DIAGNOSIS — R319 Hematuria, unspecified: Secondary | ICD-10-CM | POA: Diagnosis not present

## 2020-12-14 DIAGNOSIS — I700 Atherosclerosis of aorta: Secondary | ICD-10-CM | POA: Diagnosis not present

## 2020-12-16 DIAGNOSIS — D509 Iron deficiency anemia, unspecified: Secondary | ICD-10-CM | POA: Diagnosis not present

## 2020-12-17 DIAGNOSIS — D649 Anemia, unspecified: Secondary | ICD-10-CM

## 2020-12-17 HISTORY — DX: Anemia, unspecified: D64.9

## 2020-12-27 DIAGNOSIS — R310 Gross hematuria: Secondary | ICD-10-CM | POA: Diagnosis not present

## 2020-12-27 DIAGNOSIS — R1084 Generalized abdominal pain: Secondary | ICD-10-CM | POA: Diagnosis not present

## 2020-12-27 DIAGNOSIS — N200 Calculus of kidney: Secondary | ICD-10-CM | POA: Diagnosis not present

## 2020-12-31 DIAGNOSIS — N200 Calculus of kidney: Secondary | ICD-10-CM | POA: Diagnosis not present

## 2020-12-31 DIAGNOSIS — D649 Anemia, unspecified: Secondary | ICD-10-CM | POA: Diagnosis not present

## 2021-01-05 ENCOUNTER — Other Ambulatory Visit: Payer: Self-pay | Admitting: Urology

## 2021-01-11 DIAGNOSIS — R55 Syncope and collapse: Secondary | ICD-10-CM | POA: Diagnosis not present

## 2021-01-11 DIAGNOSIS — I2510 Atherosclerotic heart disease of native coronary artery without angina pectoris: Secondary | ICD-10-CM | POA: Diagnosis not present

## 2021-01-28 NOTE — Patient Instructions (Addendum)
DUE TO COVID-19 ONLY ONE VISITOR IS ALLOWED TO COME WITH YOU AND STAY IN THE WAITING ROOM ONLY DURING PRE OP AND PROCEDURE DAY OF SURGERY. THE 2 VISITORS  MAY VISIT WITH YOU AFTER SURGERY IN YOUR PRIVATE ROOM DURING VISITING HOURS ONLY!  YOU NEED TO HAVE A COVID 19 TEST ON____8/24___                Kristopher [name]     Your procedure is scheduled on: 02/11/21   Report to [facility] Main  Entrance   Report to admitting at 5:30 AM     Call this number if you have problems the morning of surgery [phone]    Remember: Do not eat food after Midnight.  You may have clear liquids until 4:00 am   BRUSH YOUR TEETH MORNING OF SURGERY AND RINSE YOUR MOUTH OUT, NO CHEWING GUM CANDY OR MINTS.     Take these medicines the morning of surgery with A SIP OF WATER: Gabapentin. Wellbutrin, Pantoprazole  Stop taking __Plavix _________on ___8/18/22_______as instructed by _Dr. Kim____________.  Stop taking _ASA____continue_______as directed by your Surgeon/Cardiologist.  Contact your Surgeon/Cardiologist for instructions on Anticoagulant Therapy prior to surgery.    DO NOT TAKE ANY DIABETIC MEDICATIONS DAY OF YOUR SURGERY                               You may not have any metal on your body including              piercings  Do not wear jewelry, lotions, powders or deodorant                     Men may shave face and neck.   Do not bring valuables to the hospital. [place].  Contacts, dentures or bridgework may not be worn into surgery.                   Please read over the following fact sheets you were given: _____________________________________________________________________             [facility] - Preparing for Surgery Before surgery, you can play an important role.  Because skin is not sterile, your skin needs to be as free of germs as possible.  You can reduce the number of germs on your skin by washing with CHG  (chlorahexidine gluconate) soap before surgery.  CHG is an antiseptic cleaner which kills germs and bonds with the skin to continue killing germs even after washing. Please DO NOT use if you have an allergy to CHG or antibacterial soaps.  If your skin becomes reddened/irritated stop using the CHG and inform your nurse when you arrive at Short Stay.   You may shave your face/neck.  Please follow these instructions carefully:  1.  Shower with CHG Soap the night before surgery and the  morning of Surgery.  2.  If you choose to wash your hair, wash your hair first as usual with your  normal  shampoo.  3.  After you shampoo, rinse your hair and body thoroughly to remove the  shampoo.  4.  Use CHG as you would any other liquid soap.  You can apply chg directly  to the skin and wash                       Gently with a scrungie or clean washcloth.  5.  Apply the CHG Soap to your body ONLY FROM THE NECK DOWN.   Do not use on face/ open                           Wound or open sores. Avoid contact with eyes, ears mouth and genitals (private parts).                       Wash face,  Genitals (private parts) with your normal soap.             6.  Wash thoroughly, paying special attention to the area where your surgery  will be performed.  7.  Thoroughly rinse your body with warm water from the neck down.  8.  DO NOT shower/wash with your normal soap after using and rinsing off  the CHG Soap.             9.  Pat yourself dry with a clean towel.            10.  Wear clean pajamas.            11.  Place clean sheets on your bed the night of your first shower and do not  sleep with pets. Day of Surgery : Do not apply any lotions/deodorants the morning of surgery.  Please wear clean clothes to the hospital/surgery center.  FAILURE TO FOLLOW THESE INSTRUCTIONS MAY RESULT IN THE CANCELLATION OF YOUR SURGERY PATIENT SIGNATURE_________________________________  NURSE  SIGNATURE__________________________________  ________________________________________________________________________

## 2021-01-31 ENCOUNTER — Other Ambulatory Visit: Payer: Self-pay

## 2021-01-31 ENCOUNTER — Encounter (HOSPITAL_COMMUNITY): Payer: Self-pay

## 2021-01-31 ENCOUNTER — Encounter (HOSPITAL_COMMUNITY)
Admission: RE | Admit: 2021-01-31 | Discharge: 2021-01-31 | Disposition: A | Discharge status: Home / Self Care | Payer: Medicare HMO | Source: Ambulatory Visit | Attending: Urology | Admitting: Urology

## 2021-01-31 DIAGNOSIS — Z01818 Encounter for other preprocedural examination: Secondary | ICD-10-CM | POA: Insufficient documentation

## 2021-01-31 DIAGNOSIS — I493 Ventricular premature depolarization: Secondary | ICD-10-CM | POA: Diagnosis not present

## 2021-01-31 DIAGNOSIS — Z1389 Encounter for screening for other disorder: Secondary | ICD-10-CM | POA: Diagnosis not present

## 2021-01-31 HISTORY — DX: Cardiac arrhythmia, unspecified: I49.9

## 2021-01-31 LAB — BASIC METABOLIC PANEL
Anion gap: 8 (ref 5–15)
BUN: 18 mg/dL (ref 8–23)
CO2: 25 mmol/L (ref 22–32)
Calcium: 9.6 mg/dL (ref 8.9–10.3)
Chloride: 104 mmol/L (ref 98–111)
Creatinine, Ser: 0.83 mg/dL (ref 0.61–1.24)
GFR, Estimated: >60 mL/min (ref >60)
Glucose, Bld: 198 mg/dL — ABNORMAL HIGH (ref 70–99)
Potassium: 3.9 mmol/L (ref 3.5–5.1)
Sodium: 137 mmol/L (ref 135–145)

## 2021-01-31 LAB — TYPE AND SCREEN
ABO/RH(D): A POS
Antibody Screen: NEGATIVE

## 2021-01-31 LAB — HEMOGLOBIN A1C
Hgb A1c MFr Bld: 7.4 % — ABNORMAL HIGH (ref 4.8–5.6)
Mean Plasma Glucose: 165.68 mg/dL

## 2021-01-31 LAB — CBC
HCT: 38.2 % — ABNORMAL LOW (ref 39.0–52.0)
Hemoglobin: 12.9 g/dL — ABNORMAL LOW (ref 13.0–17.0)
MCH: 30.8 pg (ref 26.0–34.0)
MCHC: 33.8 g/dL (ref 30.0–36.0)
MCV: 91.2 fL (ref 80.0–100.0)
Platelets: 174 10*3/uL (ref 150–400)
RBC: 4.19 MIL/uL — ABNORMAL LOW (ref 4.22–5.81)
RDW: 19.9 % — ABNORMAL HIGH (ref 11.5–15.5)
WBC: 6.4 10*3/uL (ref 4.0–10.5)
nRBC: 0 % (ref 0.0–0.2)

## 2021-01-31 LAB — GLUCOSE, CAPILLARY: Glucose-Capillary: 182 mg/dL — ABNORMAL HIGH (ref 70–99)

## 2021-01-31 NOTE — Progress Notes (Signed)
Covid test 02/09/21  COVID Vaccine Completed:yes    PCP - Dr. Cheree Ditto LOV 11/29/20 Cardiologist - Dr. Thad Ranger LOV 01/11/21- clearance 12/2020 epic, chart  Chest x-ray - 09/19/19-epic EKG - 01/31/21-chart Stress Test - no ECHO - 09/19/19-epic Cardiac Cath - with stents  multi in 2015 Pacemaker/ICD device last checked: Loop recorder  Sleep Study - yes CPAP - no Wt loss. No longer uses  Fasting Blood Sugar - 170-200 Checks Blood Sugar QD_____ times a day  Blood Thinner Instructions:ASA and Eliquis/ Dr. Maudie Mercury Aspirin Instructions:Continue the ASA stop Eliquis 7 days prior to DOS/ Dr. Maudie Mercury Last Dose:02/03/21  Anesthesia review: yes  Patient denies shortness of breath, fever, cough and chest pain at PAT appointment Pt has no SOB with climbing stairs, doing housework or with ADls.  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  Patient verbalized understanding of instructions that were given to them at the PAT appointment. Patient was also instructed that they will need to review over the PAT instructions again at home before surgery. yes

## 2021-02-01 LAB — URINE CULTURE: Culture: NO GROWTH

## 2021-02-01 NOTE — Progress Notes (Signed)
Anesthesia Chart Review:   Case: [id number] Date/Time: 02/11/21 0715   Procedure: LEFT NEPHROLITHOTOMY PERCUTANEOUS WITH SURGEON ACCESS (Left)   Anesthesia type: General   Pre-op diagnosis: LEFT URETEROPELVIC JUNCTION  STONE   Location: [place] / [facility]   Surgeons: [name], MD       DISCUSSION: Pt is 70 years old with hx CAD (DES to RCA 06/27/2013), ischemic cardiomyopathy (EF 55-60% by 2021 echo), atrial fibrillation (s/p PVI x2  ~2000 at [facility]), atrial flutter (s/p ablation ~2000), pericardial effusion (chronic), DM, NASH, cirrhosis, recurrent syncope (thought to be vasovagal) since 09/2019 (s/p loop recorder implanted 01/2020)  Pt to hold plavix 7 days before surgery   VS: BP 121/68   Pulse 72   Temp 36.7 C (Oral)   Resp 18   Ht _0  (1.702 m)   Wt 71.8 kg   SpO2 100%   BMI 24.81 kg/m   PROVIDERS: - PCP is Townsend Roger, MD  - Cardiologist is Sheliah Plane, MD. Last office visit 01/11/21 (notes in care everywhere). Note in paper chart indicates [employer], PA in Dr. Julianne Rice office is aware of upcoming procedure and gave ok to hold plavix x 7 days  LABS: Labs reviewed: Acceptable for surgery. - LFTs 11/04/20 from PCP's office were normal  (all labs ordered are listed, but only abnormal results are displayed)  Labs Reviewed  HEMOGLOBIN A1C - Abnormal; Notable for the following components:      Result Value   Hgb A1c MFr Bld 7.4 (*)    All other components within normal limits  CBC - Abnormal; Notable for the following components:   RBC 4.19 (*)    Hemoglobin 12.9 (*)    HCT 38.2 (*)    RDW 19.9 (*)    All other components within normal limits  BASIC METABOLIC PANEL - Abnormal; Notable for the following components:   Glucose, Bld 198 (*)    All other components within normal limits  GLUCOSE, CAPILLARY - Abnormal; Notable for the following components:   Glucose-Capillary 182 (*)    All other components within normal limits  URINE CULTURE  TYPE AND  SCREEN     IMAGES: MRI brain 09/18/19:  - No acute or subacute finding by MRI. Specifically, no hippocampal lesion seen in this patient with a history of transient global amnesia. - Moderate changes of chronic small vessel disease of the cerebral hemispheric white matter  Abd [place] limited liver RUQ 08/25/19:  1. Mildly diffusely echogenic liver with a mild increase in echogenicity since 2017. This is compatible with steatosis seen on the CT in 2015 with mild progression. Chronic hepatitis and cirrhosis can also have this appearance. 2. Prominent caudate lobe and lateral segment left lobe of the liver. This can be seen with cirrhosis. 3. No ascites. 4. Small to moderate-sized pericardial effusion   EKG 01/31/21: Sinus rhythm with frequent Premature ventricular complexes  CV: Tilt table 12/29/19 (care everywhere)- result not available in Epic: from notes, "His Tilt table test on 12/29/19 did not demonstrate any significant hemodynamic changes or bradycardia"  Cardiac event monitor 12/24/19 (from Dr. Julianne Rice 01/11/21 note in care everywhere): IMPRESSION: *The predominant rhythm was sinus rhythm to sinus tachycardia. *The Maximum Heart Rate recorded was 142 bpm, Day 14 / 10:41:11 am, the Minimum Heart Rate recorded was 68 bpm, Day 12 / 10:19:34 am and the Average Heart Rate was 89 bpm. *There were 24213 PVCs with a burden of 1.44 %.  *There were  1499 PSVCs with a burden of 0.09 %. There were 6 occurrences of Supraventricular Tachycardia with the longest episode 25 beats, Day 3 / 01:54:10 am and the fastest episode 138 bpm, Day 4 / 06:14:12 pm.  *Diary events were entered. There were 18 Patient triggered events corresponding to sinus mechanism rhythms with and without PVCs   Echo 09/19/19:  1. Normal LV systolic function; moderate LVH; moderate pericardial effusion; no significant respiratory variation and IVC not dilated and collapses.   2. Left ventricular ejection fraction, by estimation, is 55 to 60%.  The left ventricle has normal function. The left ventricle has no regional wall motion abnormalities. There is moderate left ventricular hypertrophy. Left ventricular diastolic function could not be evaluated.   3. Right ventricular systolic function is normal. The right ventricular size is normal.   4. Moderate pericardial effusion.   5. The mitral valve is normal in structure. Trivial mitral valve  regurgitation. No evidence of mitral stenosis.   6. The aortic valve is tricuspid. Aortic valve regurgitation is not visualized. No aortic stenosis is present.   7. The inferior vena cava is normal in size with greater than 50% respiratory variability, suggesting right atrial pressure of 3 mmHg.  - Additional Comments: Normal LV systolic function; oderate LVH; moderate pericardial effusion; no significant respiratory variation and IVC not dilated and collapses   Cardiac cath 09/22/15  at [facility]: I do not have records. By notes (11/16/15 in care everywhere), "LHC without significant disease" and "symptoms resolved... with increase of calcium channel blocker"    Stress MRI 06/06/18 (care everywhere):  1. The left ventricle is normal in cavity size and wall thickness. The basal-mid inferior wall and basal-mid inferoseptum are mildly hypokinetic. Global systolic function is preserved with an LVEF calculated at 55%.  2. The right ventricle is normal in cavity size, wall thickness, and systolic function. The RVEF is 55%.  3. The LA is mildly enlarged.  There is lipomatous hypertrophy of the interatrial septum.  4.  There is a moderate sized circumferential pericardial effusion.  The extent of the effusion is greatest  adjacent to the basal LV lateral wall 1.2 cm (prior 1.5 cm) and adjacent to the inferior portion of the RV free wall 1.2 cm (prior 1.5 cm). No pericardium thickening is noted.  No obvious pericardial hyperenhancement is noted.  No RA/RV compression is noted.  There is increased adiposity within the  pericardial space.  5. The aortic valve is trileaflet with normal leaflet excursion. There is trivial aortic regurgitation. There is no significant aortic stenosis.  6.  There is trivial mitral regurgitation and trivial tricuspid regurgitation.  7. Delayed enhancement imaging is abnormal. There is subendocardial hyperenhancement involving inferior wall and basal-mid inferoseptum, consistent with subendocardial infarction in the distribution of the RCA. The RCA territory as 50-60% residual viability present.  All other perfusion territories are fully viable.  8. Adenosine stress perfusion imaging demonstrates no perfusion defects suggestive of inducible myocardial ischemia.    9.  There is no evidence of an intracardiac thrombus.  - Compared with prior, there has been no significant change  Cardiac cath 10/07/13 1. Normal right heart pressures without evidence for constrictive or restrictive physiology 2. Mild nonobstructive coronary artery disease with 10-20% narrowing in the proximal LAD proximal to a segment which seemed to dip intramyocardially, without evidence for muscle bridging; 30-40% narrowing in the circumflex marginal 1 vessel; 23% narrowing in the proximal RCA with a widely patent RCA stent beyond the acute margin.  3. Preserved LV contractility with an ejection fraction of 55% with mild residual subtle mid inferior hypocontractility.   Past Medical History:  Diagnosis Date   Anemia 12/2020   Anginal pain ([place])    Atrial fibrillation ([place])    a. s/p PVI X2 by Dr Omelia Blackwater at [facility] around 2000.   CAD (coronary artery disease)    a. NSTEMI (06/2013):  LHC (06/27/2013):  pLAD 10-20, RCA 90, EF 45-50%, inf HK.  PCI:  Xience Alpine (3 x 23 mm) DES to the RCA. b. Relook cath 07/10/13 - patent stent. Imdur added. c. Recurrent CP after that - abnormal nuc 07/2017 with chest pain, dyspnea, hypotensive response. Rest images with mid-basal inferior thinning. No stress images due to abrupt d/c of test.  Relook cath 3/12 - no significant r   Chronic back pain greater than 3 months duration    Cirrhosis, non-alcoholic (HCC)    Depression    DJD (degenerative joint disease) of cervical spine    "3 herniated discs" (09/01/2013)   DJD (degenerative joint disease), lumbosacral    "bulging L5-S1" (09/01/2013)   Dysrhythmia    PVCs   Herniated cervical disc    c2-T1 fusion   HLD (hyperlipidemia)    Ischemic cardiomyopathy    a. Echo (06/28/2011): Inferior and inferoseptal akinesis, EF 96%, grade 2 diastolic dysfunction, MAC, small effusion (no tamponade). B. EF 40% by echo 06/2013, cath 07/2013, 55% by cath 08/2013 with subtle mild mid inferior hypocontractility.   Liver cirrhosis secondary to NASH [facility])    NSTEMI (non-ST elevated myocardial infarction) ([place]) 06/27/2013   Osteoarthritis    "neck and lower back" (09/01/2013)   Pericardial effusion    a. Chronic since hx of pericarditis in 2004. b. Mod by echo 06/2013, small by echo 06/2013.   Pneumonia ~ 2005   Type II diabetes mellitus ([place])     Past Surgical History:  Procedure Laterality Date   APPENDECTOMY  ~ 1965   ATRIAL FIBRILLATION ABLATION  2000   Dr Omelia Blackwater at [place] N/A 10/07/2013   Procedure: LEFT AND RIGHT HEART CATHETERIZATION WITH CORONARY ANGIOGRAM;  Surgeon: Troy Sine, MD;  Location: [facility] CATH LAB;  Service: Cardiovascular;  Laterality: N/A;   LEFT HEART CATHETERIZATION WITH CORONARY ANGIOGRAM N/A 06/27/2013   Procedure: LEFT HEART CATHETERIZATION WITH CORONARY ANGIOGRAM;  Surgeon: Troy Sine, MD;  Location: [facility] CATH LAB;  Service: Cardiovascular;  Laterality: N/A;   LEFT HEART CATHETERIZATION WITH CORONARY ANGIOGRAM N/A 07/10/2013   Procedure: LEFT HEART CATHETERIZATION WITH CORONARY ANGIOGRAM;  Surgeon: Burnell Blanks, MD;  Location: [facility] C[facility] LAB;  Service: Cardiovascular;  Laterality: N/A;   LEFT HEART CATHETERIZATION WITH CORONARY ANGIOGRAM N/A 08/28/2013    Procedure: LEFT HEART CATHETERIZATION WITH CORONARY ANGIOGRAM;  Surgeon: Troy Sine, MD;  Location: [facility] CATH LAB;  Service: Cardiovascular;  Laterality: N/A;   PERCUTANEOUS CORONARY STENT INTERVENTION (PCI-S)  06/27/2013   Procedure: PERCUTANEOUS CORONARY STENT INTERVENTION (PCI-S);  Surgeon: Troy Sine, MD;  Location: [facility] CATH LAB;  Service: Cardiovascular;;   POSTERIOR CERVICAL FUSION/FORAMINOTOMY  2009   fusion c3-t1   PULMONARY VEIN ABLATION  2001   Dr Omelia Blackwater at [place]:  aspirin EC 81 MG tablet   baclofen (LIORESAL) 10 MG tablet   buPROPion (WELLBUTRIN XL) 300 MG 24 hr tablet   clopidogrel (PLAVIX) 75 MG tablet   Coenzyme Q10 100 MG TABS  diclofenac Sodium (VOLTAREN) 1 % GEL   diltiazem (CARDIZEM SR) 120 MG 12 hr capsule   gabapentin (NEURONTIN) 300 MG capsule   ibuprofen (ADVIL) 200 MG tablet   metFORMIN (GLUCOPHAGE-XR) 500 MG 24 hr tablet   niacin 500 MG tablet   nitroGLYCERIN (NITROSTAT) 0.4 MG SL tablet   pantoprazole (PROTONIX) 40 MG tablet   pioglitazone (ACTOS) 15 MG tablet   promethazine (PHENERGAN) 25 MG tablet   rosuvastatin (CRESTOR) 5 MG tablet   No current facility-administered medications for this encounter.   - Pt to hold plavix 7 days before surgery   If no changes, I anticipate pt can proceed with surgery as scheduled.   Willeen Cass, PhD, FNP-BC [facility]/Anesthesiology Phone: [phone] 02/03/2021 10:52 AM

## 2021-02-03 NOTE — Anesthesia Preprocedure Evaluation (Addendum)
Anesthesia Evaluation  Patient identified by MRN, date of birth, ID band Patient awake    Reviewed: Allergy & Precautions, NPO status , Patient's Chart, lab work & pertinent test results  History of Anesthesia Complications Negative for: history of anesthetic complications  Airway Mallampati: II  TM Distance: >3 FB Neck ROM: Full    Dental  (+) Dental Advisory Given, Upper Dentures   Pulmonary sleep apnea , former smoker,    Pulmonary exam normal        Cardiovascular + CAD, + Past MI, + Cardiac Stents (2015) and +CHF  Normal cardiovascular exam+ dysrhythmias Atrial Fibrillation      Neuro/Psych Depression negative neurological ROS     GI/Hepatic negative GI ROS, (+) Cirrhosis       , Hepatitis - ([facility])  Endo/Other  diabetes, Oral Hypoglycemic Agents  Renal/GU negative Renal ROS  negative genitourinary   Musculoskeletal  (+) Arthritis ,   Abdominal   Peds  Hematology  (+) anemia , Plavix   Anesthesia Other Findings   Reproductive/Obstetrics                           Anesthesia Physical Anesthesia Plan  ASA: 3  Anesthesia Plan: General   Post-op Pain Management:    Induction: Intravenous  PONV Risk Score and Plan: 2 and Ondansetron, Dexamethasone, Treatment may vary due to age or medical condition and Midazolam  Airway Management Planned: Oral ETT  Additional Equipment: None  Intra-op Plan:   Post-operative Plan: Extubation in OR  Informed Consent: I have reviewed the patients History and Physical, chart, labs and discussed the procedure including the risks, benefits and alternatives for the proposed anesthesia with the patient or authorized representative who has indicated his/her understanding and acceptance.     Dental advisory given  Plan Discussed with:   Anesthesia Plan Comments: (See APP note by Durel Salts, FNP )       Anesthesia Quick Evaluation

## 2021-02-10 ENCOUNTER — Other Ambulatory Visit: Payer: Self-pay | Admitting: Urology

## 2021-02-10 LAB — SARS CORONAVIRUS 2 (TAT 6-24 HRS): SARS Coronavirus 2: NEGATIVE

## 2021-02-11 ENCOUNTER — Ambulatory Visit (HOSPITAL_COMMUNITY): Payer: Medicare HMO | Admitting: Emergency Medicine

## 2021-02-11 ENCOUNTER — Ambulatory Visit (HOSPITAL_COMMUNITY): Payer: Medicare HMO

## 2021-02-11 ENCOUNTER — Observation Stay (HOSPITAL_COMMUNITY)
Admission: RE | Admit: 2021-02-11 | Discharge: 2021-02-12 | Disposition: A | Discharge status: Home / Self Care | Payer: Medicare HMO | Attending: Urology | Admitting: Urology

## 2021-02-11 ENCOUNTER — Encounter (HOSPITAL_COMMUNITY)
Admission: RE | Disposition: A | Discharge status: Home / Self Care | Payer: Self-pay | Source: Home / Self Care | Attending: Urology

## 2021-02-11 ENCOUNTER — Encounter (HOSPITAL_COMMUNITY): Payer: Self-pay | Admitting: Urology

## 2021-02-11 ENCOUNTER — Ambulatory Visit (HOSPITAL_COMMUNITY): Payer: Medicare HMO | Admitting: Anesthesiology

## 2021-02-11 ENCOUNTER — Other Ambulatory Visit: Payer: Self-pay

## 2021-02-11 DIAGNOSIS — Z87891 Personal history of nicotine dependence: Secondary | ICD-10-CM | POA: Insufficient documentation

## 2021-02-11 DIAGNOSIS — N202 Calculus of kidney with calculus of ureter: Secondary | ICD-10-CM | POA: Diagnosis not present

## 2021-02-11 DIAGNOSIS — Z955 Presence of coronary angioplasty implant and graft: Secondary | ICD-10-CM | POA: Diagnosis not present

## 2021-02-11 DIAGNOSIS — I4891 Unspecified atrial fibrillation: Secondary | ICD-10-CM | POA: Insufficient documentation

## 2021-02-11 DIAGNOSIS — R310 Gross hematuria: Secondary | ICD-10-CM | POA: Diagnosis present

## 2021-02-11 DIAGNOSIS — Z7984 Long term (current) use of oral hypoglycemic drugs: Secondary | ICD-10-CM | POA: Diagnosis not present

## 2021-02-11 DIAGNOSIS — N200 Calculus of kidney: Secondary | ICD-10-CM | POA: Diagnosis not present

## 2021-02-11 DIAGNOSIS — Z7982 Long term (current) use of aspirin: Secondary | ICD-10-CM | POA: Diagnosis not present

## 2021-02-11 DIAGNOSIS — E119 Type 2 diabetes mellitus without complications: Secondary | ICD-10-CM | POA: Diagnosis not present

## 2021-02-11 DIAGNOSIS — I214 Non-ST elevation (NSTEMI) myocardial infarction: Secondary | ICD-10-CM | POA: Diagnosis not present

## 2021-02-11 DIAGNOSIS — I313 Pericardial effusion (noninflammatory): Secondary | ICD-10-CM | POA: Diagnosis not present

## 2021-02-11 DIAGNOSIS — G4733 Obstructive sleep apnea (adult) (pediatric): Secondary | ICD-10-CM | POA: Diagnosis not present

## 2021-02-11 HISTORY — PX: NEPHROLITHOTOMY: SHX5134

## 2021-02-11 LAB — GLUCOSE, CAPILLARY
Glucose-Capillary: 211 mg/dL — ABNORMAL HIGH (ref 70–99)
Glucose-Capillary: 232 mg/dL — ABNORMAL HIGH (ref 70–99)
Glucose-Capillary: 252 mg/dL — ABNORMAL HIGH (ref 70–99)
Glucose-Capillary: 248 mg/dL — ABNORMAL HIGH (ref 70–99)
Glucose-Capillary: 263 mg/dL — ABNORMAL HIGH (ref 70–99)

## 2021-02-11 LAB — CBC
HCT: 31.9 % — ABNORMAL LOW (ref 39.0–52.0)
Hemoglobin: 10.4 g/dL — ABNORMAL LOW (ref 13.0–17.0)
MCH: 31.5 pg (ref 26.0–34.0)
MCHC: 32.6 g/dL (ref 30.0–36.0)
MCV: 96.7 fL (ref 80.0–100.0)
Platelets: 124 10*3/uL — ABNORMAL LOW (ref 150–400)
RBC: 3.3 MIL/uL — ABNORMAL LOW (ref 4.22–5.81)
RDW: 19.7 % — ABNORMAL HIGH (ref 11.5–15.5)
WBC: 6.6 10*3/uL (ref 4.0–10.5)
nRBC: 0 % (ref 0.0–0.2)

## 2021-02-11 LAB — BASIC METABOLIC PANEL
Anion gap: 11 (ref 5–15)
BUN: 17 mg/dL (ref 8–23)
CO2: 25 mmol/L (ref 22–32)
Calcium: 8.8 mg/dL — ABNORMAL LOW (ref 8.9–10.3)
Chloride: 104 mmol/L (ref 98–111)
Creatinine, Ser: 0.89 mg/dL (ref 0.61–1.24)
GFR, Estimated: >60 mL/min (ref >60)
Glucose, Bld: 251 mg/dL — ABNORMAL HIGH (ref 70–99)
Potassium: 4.4 mmol/L (ref 3.5–5.1)
Sodium: 140 mmol/L (ref 135–145)

## 2021-02-11 LAB — ABO/RH: ABO/RH(D): A POS

## 2021-02-11 SURGERY — NEPHROLITHOTOMY PERCUTANEOUS
Anesthesia: General | Laterality: Left

## 2021-02-11 MED ORDER — ACETAMINOPHEN 10 MG/ML IV SOLN
1000.0000 mg | Freq: Four times a day (QID) | INTRAVENOUS | Status: AC
  Administered 2021-02-11 – 2021-02-12 (×4): 1000 mg via INTRAVENOUS
  Filled 2021-02-11 (×4): qty 100

## 2021-02-11 MED ORDER — AMISULPRIDE (ANTIEMETIC) 5 MG/2ML IV SOLN: 10.0000 mg | Freq: Once | INTRAVENOUS | Status: DC | PRN

## 2021-02-11 MED ORDER — TRAMADOL HCL 50 MG PO TABS: 50.0000 mg | ORAL_TABLET | Freq: Four times a day (QID) | ORAL | 0 refills | Status: DC | PRN

## 2021-02-11 MED ORDER — ONDANSETRON HCL 4 MG/2ML IJ SOLN
INTRAMUSCULAR | Status: DC | PRN
  Administered 2021-02-11: 4 mg via INTRAVENOUS

## 2021-02-11 MED ORDER — BUPIVACAINE-EPINEPHRINE (PF) 0.5% -1:200000 IJ SOLN
INTRAMUSCULAR | Status: AC
  Filled 2021-02-11: qty 30

## 2021-02-11 MED ORDER — CHLORHEXIDINE GLUCONATE 0.12 % MT SOLN
15.0000 mL | Freq: Once | OROMUCOSAL | Status: AC
  Administered 2021-02-11: 15 mL via OROMUCOSAL

## 2021-02-11 MED ORDER — OXYCODONE HCL 5 MG PO TABS: 5.0000 mg | ORAL_TABLET | Freq: Once | ORAL | Status: DC | PRN

## 2021-02-11 MED ORDER — FENTANYL CITRATE PF 50 MCG/ML IJ SOSY
PREFILLED_SYRINGE | INTRAMUSCULAR | Status: AC
  Filled 2021-02-11: qty 1

## 2021-02-11 MED ORDER — FENTANYL CITRATE PF 50 MCG/ML IJ SOSY
25.0000 ug | PREFILLED_SYRINGE | INTRAMUSCULAR | Status: DC | PRN
  Administered 2021-02-11: 50 ug via INTRAVENOUS

## 2021-02-11 MED ORDER — PROPOFOL 10 MG/ML IV BOLUS
INTRAVENOUS | Status: DC | PRN
  Administered 2021-02-11: 120 mg via INTRAVENOUS

## 2021-02-11 MED ORDER — BUPROPION HCL ER (XL) 300 MG PO TB24
300.0000 mg | ORAL_TABLET | Freq: Every day | ORAL | Status: DC
  Administered 2021-02-12: 300 mg via ORAL
  Filled 2021-02-11: qty 1

## 2021-02-11 MED ORDER — SODIUM CHLORIDE (PF) 0.9 % IJ SOLN
INTRAMUSCULAR | Status: AC
  Filled 2021-02-11: qty 10

## 2021-02-11 MED ORDER — ROCURONIUM BROMIDE 10 MG/ML (PF) SYRINGE
PREFILLED_SYRINGE | INTRAVENOUS | Status: AC
  Filled 2021-02-11: qty 10

## 2021-02-11 MED ORDER — HYDROMORPHONE HCL 1 MG/ML IJ SOLN: 0.5000 mg | INTRAMUSCULAR | Status: DC | PRN

## 2021-02-11 MED ORDER — SCOPOLAMINE 1 MG/3DAYS TD PT72
MEDICATED_PATCH | TRANSDERMAL | Status: AC
  Filled 2021-02-11: qty 1

## 2021-02-11 MED ORDER — ESMOLOL HCL 100 MG/10ML IV SOLN
INTRAVENOUS | Status: DC | PRN
  Administered 2021-02-11: 30 mg via INTRAVENOUS

## 2021-02-11 MED ORDER — GABAPENTIN 300 MG PO CAPS
600.0000 mg | ORAL_CAPSULE | Freq: Four times a day (QID) | ORAL | Status: DC
  Administered 2021-02-11 – 2021-02-12 (×3): 600 mg via ORAL
  Filled 2021-02-11 (×3): qty 2

## 2021-02-11 MED ORDER — LIDOCAINE 2% (20 MG/ML) 5 ML SYRINGE
INTRAMUSCULAR | Status: AC
  Filled 2021-02-11: qty 5

## 2021-02-11 MED ORDER — ROSUVASTATIN CALCIUM 5 MG PO TABS
5.0000 mg | ORAL_TABLET | Freq: Every evening | ORAL | Status: DC
  Administered 2021-02-11: 5 mg via ORAL
  Filled 2021-02-11: qty 1

## 2021-02-11 MED ORDER — NIACIN 500 MG PO TABS
500.0000 mg | ORAL_TABLET | Freq: Every day | ORAL | Status: DC
  Administered 2021-02-12: 500 mg via ORAL
  Filled 2021-02-11: qty 1

## 2021-02-11 MED ORDER — KETAMINE HCL 10 MG/ML IJ SOLN
INTRAMUSCULAR | Status: AC
  Filled 2021-02-11: qty 1

## 2021-02-11 MED ORDER — ORAL CARE MOUTH RINSE: 15.0000 mL | Freq: Once | OROMUCOSAL | Status: AC

## 2021-02-11 MED ORDER — BUPIVACAINE-EPINEPHRINE 0.5% -1:200000 IJ SOLN
INTRAMUSCULAR | Status: DC | PRN
  Administered 2021-02-11: 30 mL

## 2021-02-11 MED ORDER — NITROGLYCERIN 0.4 MG SL SUBL: 0.4000 mg | SUBLINGUAL_TABLET | SUBLINGUAL | Status: DC | PRN

## 2021-02-11 MED ORDER — FENTANYL CITRATE (PF) 250 MCG/5ML IJ SOLN
INTRAMUSCULAR | Status: AC
  Filled 2021-02-11: qty 5

## 2021-02-11 MED ORDER — KETOROLAC TROMETHAMINE 30 MG/ML IJ SOLN
INTRAMUSCULAR | Status: AC
  Filled 2021-02-11: qty 1

## 2021-02-11 MED ORDER — PROMETHAZINE HCL 25 MG/ML IJ SOLN
INTRAMUSCULAR | Status: DC | PRN
  Administered 2021-02-11: 12.5 mg via INTRAVENOUS

## 2021-02-11 MED ORDER — PROMETHAZINE HCL 25 MG/ML IJ SOLN: 6.2500 mg | INTRAMUSCULAR | Status: DC | PRN

## 2021-02-11 MED ORDER — LIDOCAINE HCL 2 % IJ SOLN
INTRAMUSCULAR | Status: AC
  Filled 2021-02-11: qty 20

## 2021-02-11 MED ORDER — ONDANSETRON HCL 4 MG/2ML IJ SOLN
INTRAMUSCULAR | Status: AC
  Filled 2021-02-11: qty 2

## 2021-02-11 MED ORDER — PANTOPRAZOLE SODIUM 40 MG PO TBEC
40.0000 mg | DELAYED_RELEASE_TABLET | Freq: Every day | ORAL | Status: DC
  Administered 2021-02-12: 40 mg via ORAL
  Filled 2021-02-11: qty 1

## 2021-02-11 MED ORDER — PROPOFOL 10 MG/ML IV BOLUS
INTRAVENOUS | Status: AC
  Filled 2021-02-11: qty 40

## 2021-02-11 MED ORDER — SUGAMMADEX SODIUM 200 MG/2ML IV SOLN
INTRAVENOUS | Status: DC | PRN
  Administered 2021-02-11: 200 mg via INTRAVENOUS

## 2021-02-11 MED ORDER — PROPOFOL 1000 MG/100ML IV EMUL
INTRAVENOUS | Status: AC
  Filled 2021-02-11: qty 100

## 2021-02-11 MED ORDER — LIDOCAINE 2% (20 MG/ML) 5 ML SYRINGE
INTRAMUSCULAR | Status: DC | PRN
Start: 2021-02-11 — End: 2021-02-11
  Administered 2021-02-11: 70 mg via INTRAVENOUS

## 2021-02-11 MED ORDER — PHENYLEPHRINE HCL-NACL 20-0.9 MG/250ML-% IV SOLN
INTRAVENOUS | Status: DC | PRN
  Administered 2021-02-11: 30 ug/min via INTRAVENOUS

## 2021-02-11 MED ORDER — ROCURONIUM BROMIDE 10 MG/ML (PF) SYRINGE
PREFILLED_SYRINGE | INTRAVENOUS | Status: DC | PRN
  Administered 2021-02-11: 20 mg via INTRAVENOUS
  Administered 2021-02-11: 70 mg via INTRAVENOUS

## 2021-02-11 MED ORDER — OXYCODONE HCL 5 MG/5ML PO SOLN: 5.0000 mg | Freq: Once | ORAL | Status: DC | PRN

## 2021-02-11 MED ORDER — CEFAZOLIN SODIUM-DEXTROSE 2-4 GM/100ML-% IV SOLN
2.0000 g | INTRAVENOUS | Status: AC
  Administered 2021-02-11: 2 g via INTRAVENOUS
  Filled 2021-02-11: qty 100

## 2021-02-11 MED ORDER — DEXAMETHASONE SODIUM PHOSPHATE 10 MG/ML IJ SOLN
INTRAMUSCULAR | Status: AC
  Filled 2021-02-11: qty 1

## 2021-02-11 MED ORDER — LACTATED RINGERS IV SOLN: INTRAVENOUS | Status: DC

## 2021-02-11 MED ORDER — MIDAZOLAM HCL 5 MG/5ML IJ SOLN
INTRAMUSCULAR | Status: DC | PRN
  Administered 2021-02-11: 2 mg via INTRAVENOUS

## 2021-02-11 MED ORDER — SODIUM CHLORIDE 0.9 % IR SOLN
Status: DC | PRN
  Administered 2021-02-11 (×3): 6000 mL via INTRAVESICAL
  Administered 2021-02-11 (×3): 3000 mL via INTRAVESICAL

## 2021-02-11 MED ORDER — PHENYLEPHRINE HCL (PRESSORS) 10 MG/ML IV SOLN
INTRAVENOUS | Status: AC
  Filled 2021-02-11: qty 2

## 2021-02-11 MED ORDER — KETAMINE HCL 10 MG/ML IJ SOLN
INTRAMUSCULAR | Status: DC | PRN
  Administered 2021-02-11 (×2): 25 mg via INTRAVENOUS

## 2021-02-11 MED ORDER — MIDAZOLAM HCL 2 MG/2ML IJ SOLN
INTRAMUSCULAR | Status: AC
  Filled 2021-02-11: qty 2

## 2021-02-11 MED ORDER — PROMETHAZINE HCL 25 MG PO TABS: 25.0000 mg | ORAL_TABLET | Freq: Four times a day (QID) | ORAL | Status: DC | PRN

## 2021-02-11 MED ORDER — BACLOFEN 10 MG PO TABS
10.0000 mg | ORAL_TABLET | Freq: Three times a day (TID) | ORAL | Status: DC
  Administered 2021-02-11 – 2021-02-12 (×3): 10 mg via ORAL
  Filled 2021-02-11 (×3): qty 1

## 2021-02-11 MED ORDER — ESMOLOL HCL 100 MG/10ML IV SOLN
INTRAVENOUS | Status: AC
  Filled 2021-02-11: qty 10

## 2021-02-11 MED ORDER — DEXAMETHASONE SODIUM PHOSPHATE 10 MG/ML IJ SOLN
INTRAMUSCULAR | Status: DC | PRN
  Administered 2021-02-11: 8 mg via INTRAVENOUS

## 2021-02-11 MED ORDER — PROPOFOL 500 MG/50ML IV EMUL
INTRAVENOUS | Status: DC | PRN
Start: 2021-02-11 — End: 2021-02-11
  Administered 2021-02-11: 25 ug/kg/min via INTRAVENOUS

## 2021-02-11 MED ORDER — IOHEXOL 300 MG/ML  SOLN
INTRAMUSCULAR | Status: DC | PRN
  Administered 2021-02-11: 10 mL
  Administered 2021-02-11: 50 mL

## 2021-02-11 MED ORDER — TRAMADOL HCL 50 MG PO TABS
50.0000 mg | ORAL_TABLET | Freq: Four times a day (QID) | ORAL | Status: DC | PRN
  Administered 2021-02-11 – 2021-02-12 (×2): 100 mg via ORAL
  Filled 2021-02-11 (×2): qty 2

## 2021-02-11 MED ORDER — ZOLPIDEM TARTRATE 5 MG PO TABS: 5.0000 mg | ORAL_TABLET | Freq: Every evening | ORAL | Status: DC | PRN

## 2021-02-11 MED ORDER — LIDOCAINE HCL (PF) 2 % IJ SOLN
INTRAMUSCULAR | Status: DC | PRN
  Administered 2021-02-11: 1.5 mg/kg/h via INTRADERMAL

## 2021-02-11 MED ORDER — SCOPOLAMINE 1 MG/3DAYS TD PT72
MEDICATED_PATCH | TRANSDERMAL | Status: DC | PRN
  Administered 2021-02-11: 1 via TRANSDERMAL

## 2021-02-11 MED ORDER — INSULIN ASPART 100 UNIT/ML IJ SOLN
0.0000 [IU] | INTRAMUSCULAR | Status: DC
  Administered 2021-02-11: 5 [IU] via SUBCUTANEOUS
  Administered 2021-02-11: 8 [IU] via SUBCUTANEOUS
  Administered 2021-02-12: 3 [IU] via SUBCUTANEOUS
  Administered 2021-02-12: 5 [IU] via SUBCUTANEOUS
  Administered 2021-02-12: 2 [IU] via SUBCUTANEOUS

## 2021-02-11 MED ORDER — DILTIAZEM HCL ER 60 MG PO CP12
120.0000 mg | ORAL_CAPSULE | ORAL | Status: DC
  Administered 2021-02-12: 120 mg via ORAL
  Filled 2021-02-11: qty 2

## 2021-02-11 MED ORDER — PROMETHAZINE HCL 25 MG/ML IJ SOLN
INTRAMUSCULAR | Status: AC
  Filled 2021-02-11: qty 1

## 2021-02-11 SURGICAL SUPPLY — 66 items
APL PRP STRL LF DISP 70% ISPRP (MISCELLANEOUS) ×1
APL SKNCLS STERI-STRIP NONHPOA (GAUZE/BANDAGES/DRESSINGS) ×1
BAG COUNTER SPONGE SURGICOUNT (BAG) IMPLANT
BAG DRN RND TRDRP ANRFLXCHMBR (UROLOGICAL SUPPLIES)
BAG SPNG CNTER NS LX DISP (BAG)
BAG URINE DRAIN 2000ML AR STRL (UROLOGICAL SUPPLIES) IMPLANT
BASKET STONE 1.7 NGAGE (UROLOGICAL SUPPLIES) ×1 IMPLANT
BASKET STONE NCOMPASS (UROLOGICAL SUPPLIES) IMPLANT
BASKET ZERO TIP NITINOL 2.4FR (BASKET) IMPLANT
BENZOIN TINCTURE PRP APPL 2/3 (GAUZE/BANDAGES/DRESSINGS) ×2 IMPLANT
BLADE SURG 15 STRL LF DISP TIS (BLADE) ×1 IMPLANT
BLADE SURG 15 STRL SS (BLADE) ×2
BOOTIES KNEE HIGH SLOAN (MISCELLANEOUS) ×1 IMPLANT
BSKT STON RTRVL ZERO TP 2.4FR (BASKET)
CATH AINSWORTH 30CC 24FR (CATHETERS) ×1 IMPLANT
CATH URET 5FR 28IN OPEN ENDED (CATHETERS) ×2 IMPLANT
CATH URET DUAL LUMEN 6-10FR 50 (CATHETERS) ×2 IMPLANT
CATH UROLOGY TORQUE 65 (CATHETERS) ×1 IMPLANT
CATH X-FORCE N30 NEPHROSTOMY (TUBING) ×2 IMPLANT
CHLORAPREP W/TINT 26 (MISCELLANEOUS) ×2 IMPLANT
DRAPE C-ARM 42X120 X-RAY (DRAPES) ×2 IMPLANT
DRAPE LINGEMAN PERC (DRAPES) ×2 IMPLANT
DRAPE SURG IRRIG POUCH 19X23 (DRAPES) ×3 IMPLANT
DRSG PAD ABDOMINAL 8X10 ST (GAUZE/BANDAGES/DRESSINGS) IMPLANT
DRSG TEGADERM 8X12 (GAUZE/BANDAGES/DRESSINGS) ×3 IMPLANT
EXTRACTOR STONE 1.7FRX115CM (UROLOGICAL SUPPLIES) ×1 IMPLANT
GAUZE SPONGE 4X4 12PLY STRL (GAUZE/BANDAGES/DRESSINGS) ×1 IMPLANT
GLOVE SURG ENC TEXT LTX SZ7.5 (GLOVE) ×2 IMPLANT
GOWN STRL REUS W/TWL XL LVL3 (GOWN DISPOSABLE) ×2 IMPLANT
GUIDEWIRE AMPLAZ .035X145 (WIRE) ×4 IMPLANT
GUIDEWIRE ANG ZIPWIRE 038X150 (WIRE) ×1 IMPLANT
GUIDEWIRE STR DUAL SENSOR (WIRE) ×2 IMPLANT
HLDR NDL AMPLATZ W/INSERTS (MISCELLANEOUS) IMPLANT
HOLDER NEEDLE AMPLATZ W/INSERT (MISCELLANEOUS) ×2 IMPLANT
IV SET EXTENSION CATH 6 NF (IV SETS) ×2 IMPLANT
KIT BASIN OR (CUSTOM PROCEDURE TRAY) ×2 IMPLANT
KIT PROBE 340X3.4XDISP GRN (MISCELLANEOUS) IMPLANT
KIT PROBE TRILOGY 3.4X340 (MISCELLANEOUS)
KIT PROBE TRILOGY 3.9X350 (MISCELLANEOUS) ×1 IMPLANT
KIT TURNOVER KIT A (KITS) ×2 IMPLANT
LASER FIB FLEXIVA PULSE ID 365 (Laser) IMPLANT
MANIFOLD NEPTUNE II (INSTRUMENTS) ×2 IMPLANT
NDL SPNL 20GX3.5 QUINCKE YW (NEEDLE) IMPLANT
NDL TROCAR 18X15 ECHO (NEEDLE) IMPLANT
NDL TROCAR 18X20 (NEEDLE) IMPLANT
NEEDLE SPNL 20GX3.5 QUINCKE YW (NEEDLE) IMPLANT
NEEDLE TROCAR 18X15 ECHO (NEEDLE) ×2 IMPLANT
NEEDLE TROCAR 18X20 (NEEDLE) IMPLANT
NS IRRIG 1000ML POUR BTL (IV SOLUTION) ×2 IMPLANT
PACK CYSTO (CUSTOM PROCEDURE TRAY) ×2 IMPLANT
SHEATH PEELAWAY SET 9 (SHEATH) ×1 IMPLANT
SPONGE T-LAP 4X18 ~~LOC~~+RFID (SPONGE) ×2 IMPLANT
STENT URET 6FRX26 CONTOUR (STENTS) ×1 IMPLANT
SUT ETHILON 3 0 PS 1 (SUTURE) ×1 IMPLANT
SUT SILK 0 (SUTURE) ×2
SUT SILK 0 30XBRD TIE 6 (SUTURE) ×1 IMPLANT
SYR 10ML LL (SYRINGE) ×2 IMPLANT
SYR 20ML LL LF (SYRINGE) ×4 IMPLANT
SYR 50ML LL SCALE MARK (SYRINGE) ×2 IMPLANT
TOWEL OR 17X26 10 PK STRL BLUE (TOWEL DISPOSABLE) ×2 IMPLANT
TRACTIP FLEXIVA PULS ID 200XHI (Laser) IMPLANT
TRACTIP FLEXIVA PULSE ID 200 (Laser)
TRAY FOLEY MTR SLVR 16FR STAT (SET/KITS/TRAYS/PACK) ×2 IMPLANT
TUBING CONNECTING 10 (TUBING) ×4 IMPLANT
TUBING STONE CATCHER TRILOGY (MISCELLANEOUS) ×1 IMPLANT
TUBING UROLOGY SET (TUBING) ×2 IMPLANT

## 2021-02-11 NOTE — Interval H&P Note (Signed)
History and Physical Interval Note:  02/11/2021 7:33 AM  Kristopher [name]  has presented today for surgery, with the diagnosis of LEFT URETEROPELVIC JUNCTION  STONE.  The various methods of treatment have been discussed with the patient and family. After consideration of risks, benefits and other options for treatment, the patient has consented to  Procedure(s): LEFT NEPHROLITHOTOMY PERCUTANEOUS WITH SURGEON ACCESS (Left) as a surgical intervention.  The patient's history has been reviewed, patient examined, no change in status, stable for surgery.  I have reviewed the patient's chart and labs.  Questions were answered to the patient's satisfaction.     [name]

## 2021-02-11 NOTE — Anesthesia Procedure Notes (Signed)
Procedure Name: Intubation Date/Time: 02/11/2021 7:43 AM Performed by: Cleda Daub, CRNA Pre-anesthesia Checklist: Patient identified, Emergency Drugs available, Suction available and Patient being monitored Patient Re-evaluated:Patient Re-evaluated prior to induction Oxygen Delivery Method: Circle system utilized Preoxygenation: Pre-oxygenation with 100% oxygen Induction Type: IV induction Ventilation: Mask ventilation without difficulty Laryngoscope Size: Glidescope and 4 (pt has cervial pain and neuropathy; glidescope used to minimize neck mobility.) Grade View: Grade I Tube type: Oral Number of attempts: 1 Airway Equipment and Method: Stylet and Oral airway Placement Confirmation: ETT inserted through vocal cords under direct vision, positive ETCO2 and breath sounds checked- equal and bilateral Secured at: 22 cm Tube secured with: Tape Dental Injury: Teeth and Oropharynx as per pre-operative assessment

## 2021-02-11 NOTE — Transfer of Care (Signed)
Immediate Anesthesia Transfer of Care Note  Patient: Kristopher [name]  Procedure(s) Performed: LEFT NEPHROLITHOTOMY PERCUTANEOUS WITH SURGEON ACCESS (Left)  Patient Location: PACU  Anesthesia Type:General  Level of Consciousness: awake, alert , oriented and patient cooperative  Airway & Oxygen Therapy: Patient Spontanous Breathing and Patient connected to face mask oxygen  Post-op Assessment: Report given to RN and Post -op Vital signs reviewed and stable  Post vital signs: Reviewed and stable  Last Vitals:  Vitals Value Taken Time  BP 133/77 02/11/21 1030  Temp    Pulse 82 02/11/21 1034  Resp 23 02/11/21 1034  SpO2 100 % 02/11/21 1034  Vitals shown include unvalidated device data.  Last Pain:  Vitals:   02/11/21 0607  TempSrc:   PainSc: 4          Complications: No notable events documented.

## 2021-02-11 NOTE — Discharge Instructions (Signed)
Discharge instructions following PCNL  Call your doctor for: Fevers greater than 100.5 Severe nausea or vomiting Increasing pain not controlled by pain medication Increasing redness or drainage from incisions Decreased urine output or a catheter is no longer draining  The number for questions is [phone].  Activity: Gradually increase activity with short frequent walks, 3-4 times a day.  Avoid strenuous activities, like sports, lawn-mowing, or heavy lifting (more than 10-15 pounds).  Wear loose, comfortable clothing that pull or kink the tube or tubes.  Do not drive while taking pain medication, or until your doctor permitts it.  Bathing and dressing changes: You should not shower for 48 hours after surgery.  Do not soak your back in a bathtub.  Diet: It is extremely important to drink plenty of fluids after surgery, especially water.  You may resume your regular diet, unless otherwise instructed.  Medications: May take Tylenol (acetaminophen) or ibuprofen (Advil, Motrin) as directed over-the-counter. Take any prescriptions as directed.  Follow-up appointments: Follow-up appointment will be scheduled with Dr. Louis Meckel in 10-14 days for hospital check and stent removal.

## 2021-02-11 NOTE — Anesthesia Postprocedure Evaluation (Signed)
Anesthesia Post Note  Patient: Kristopher [name]  Procedure(s) Performed: LEFT NEPHROLITHOTOMY PERCUTANEOUS WITH SURGEON ACCESS (Left)     Patient location during evaluation: PACU Anesthesia Type: General Level of consciousness: awake and alert Pain management: pain level controlled Vital Signs Assessment: post-procedure vital signs reviewed and stable Respiratory status: spontaneous breathing, nonlabored ventilation and respiratory function stable Cardiovascular status: blood pressure returned to baseline and stable Postop Assessment: no apparent nausea or vomiting Anesthetic complications: no   No notable events documented.  Last Vitals:  Vitals:   02/11/21 1412 02/11/21 1517  BP: 137/75 138/78  Pulse: 92 91  Resp: 18 18  Temp: 36.6 C 36.7 C  SpO2: 93% 92%    Last Pain:  Vitals:   02/11/21 1517  TempSrc: Oral  PainSc:                  Lidia Collum

## 2021-02-11 NOTE — H&P (Signed)
70 year old male who presents today for further discussion of a large left UPJ stone. It currently measures 2.3 cm. There are no additional stones within the patient's collecting system bilaterally.   The patient states that for the last 6 months he has been having intermittent left-sided flank pain. When he has pain he has severe left-sided pain with associated nausea and vomiting. He also is having pain at the end of his stream. He has had gross hematuria. He does take Xarelto because of a coronary artery stent. This was placed in 2015. The patient otherwise is shape. He denies any other history of nephrolithiasis.   The patient has a history of heart attack in 2015 he has had 1 stent placed since that time. He takes Plavix. He is a type 2 diabetic, but his blood sugars have been well controlled. He has great exercise tolerance and is otherwise reasonably healthy.     ALLERGIES: Codeine Derivatives Opiates    MEDICATIONS: Aspirin Ec 81 mg tablet, delayed release  Baclofen 20 MG Oral Tablet 0 Oral  Cinnamon TABS Oral  Clopidogrel 75 mg tablet  Co Q-10 100 mg capsule  Diltiazem Hcl 120 mg tablet  Gabapentin 300 MG Oral Capsule Oral  Metformin Er Gastric 1,000 mg tablet, er gastric retention 24 hr  Niacin  Pantoprazole Sodium  Pioglitazone Hcl 15 mg tablet  Promethazine Hcl 25 mg tablet  Rosuvastatin Calcium 5 mg tablet  Vitamin B Complex TABS Oral  Vitamin C TABS Oral  Wellbutrin SR 150 MG Oral Tablet Extended Release 12 Hour Oral     GU PSH: No GU PSH      PSH Notes: Tonsillectomy With Adenoidectomy, Laminectomy Cervical, Appendectomy, Cardiac Catheter His Ablation  stent placement 2015   NON-GU PSH: Appendectomy - 2013 Remove Spinal Lamina - 2013 Remove Tonsils And Adenoids - 2013     GU PMH: None     PMH Notes:  1898-06-19 00:00:00 - Note: Normal Routine History And Physical Adult  2012-01-29 11:01:16 - Note: Arthritis  heart attack   NON-GU PMH: Arrhythmia, Rhythm  Disorder - 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, 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 Personal history of other endocrine, nutritional and metabolic disease, History of diabetes mellitus - 2014, History of hypercholesterolemia, - 2014 Unspecified atrial fibrillation, Atrial Fibrillation - 2014 Arthritis Atrial Fibrillation Depression Diabetes Type 2 Hypercholesterolemia Liver Disease    FAMILY HISTORY: Death In The Family Father - Father Death In The Family Mother - Mother hepatic failure - Father ovarian cancer - Mother   SOCIAL HISTORY: Marital Status: Married Preferred Language: English; Ethnicity: Not Hispanic Or Latino; Race: White Current Smoking Status: Patient does not smoke anymore.   Tobacco Use Assessment Completed: Used Tobacco in last 30 days? Has never drank.  Drinks 4+ caffeinated drinks per day.     Notes: Former smoker, Occupation:, Marital History - Currently Married, Caffeine Use, Alcohol Use   REVIEW OF SYSTEMS:    GU Review Male:   Patient reports burning/ pain with urination and get up at night to urinate. Patient denies frequent urination, hard to postpone urination, leakage of urine, stream starts and stops, trouble starting your stream, have to strain to urinate , erection problems, and penile pain.  Gastrointestinal (Upper):   Patient denies indigestion/ heartburn, vomiting, and nausea.  Gastrointestinal (Lower):   Patient denies diarrhea and constipation.  Constitutional:   Patient denies fever, night sweats, weight loss, and fatigue.  Skin:   Patient denies skin rash/ lesion and itching.  Eyes:  Patient denies blurred vision and double vision.  Ears/ Nose/ Throat:   Patient denies sore throat and sinus problems.  Hematologic/Lymphatic:   Patient denies swollen glands and easy bruising.  Cardiovascular:   Patient denies leg swelling and chest pains.  Respiratory:   Patient denies cough and shortness of breath.  Endocrine:   Patient denies excessive thirst.   Musculoskeletal:   Patient denies back pain and joint pain.  Neurological:   Patient denies headaches and dizziness.  Psychologic:   Patient denies depression and anxiety.   VITAL SIGNS:      12/27/2020 08:32 AM  Weight 160 lb / 72.57 kg  Height 67 in / 170.18 cm  BP 92/55 mmHg  Pulse 67 /min  Temperature 97.5 F / 36.3 C  BMI 25.1 kg/m   MULTI-SYSTEM PHYSICAL EXAMINATION:    Constitutional: Well-nourished. No physical deformities. Normally developed. Good grooming.  Neck: Neck symmetrical, not swollen. Normal tracheal position.  Respiratory: No labored breathing, no use of accessory muscles.   Cardiovascular: Normal temperature, normal extremity pulses, no swelling, no varicosities.  Lymphatic: No enlargement of neck, axillae, groin.  Skin: No paleness, no jaundice, no cyanosis. No lesion, no ulcer, no rash.  Neurologic / Psychiatric: Oriented to time, oriented to place, oriented to person. No depression, no anxiety, no agitation.  Gastrointestinal: No mass, no tenderness, no rigidity, non obese abdomen.  Eyes: Normal conjunctivae. Normal eyelids.  Ears, Nose, Mouth, and Throat: Left ear no scars, no lesions, no masses. Right ear no scars, no lesions, no masses. Nose no scars, no lesions, no masses. Normal hearing. Normal lips.  Musculoskeletal: Normal gait and station of head and neck.     Complexity of Data:  Source Of History:  Patient  Records Review:   Previous Doctor Records, Previous Patient Records  Urine Test Review:   Urinalysis  X-Ray Review: C.T. Abdomen/Pelvis: Reviewed Films. Discussed With Patient.     PROCEDURES:          Urinalysis - 81003 Dipstick Dipstick Cont'd  Color: Yellow Bilirubin: Neg  Appearance: Clear Ketones: Trace  Specific Gravity: 1.020 Blood: Neg  pH: 5.5 Protein: Trace  Glucose: Neg Urobilinogen: 0.2    Nitrites: Neg    Leukocyte Esterase: Neg    Notes:      ASSESSMENT:      ICD-10 Details  1 GU:   Gross hematuria - R31.0   2    Flank Pain - R10.84   3   Renal calculus - N20.0    PLAN:           Schedule Return Visit/Planned Activity: ASAP - Schedule Surgery          Document Letter(s):  Created for Patient: Clinical Summary         Notes:   The patient has intermittently symptomatic left large UPJ stone. In addition he has likely associated gross hematuria, exacerbated by his relative. His stone measures 2.3 cm on the most recent CT scan.   I went through the treatment options for this patient. We discussed all 3 treatment modalities including shockwave therapy, ureteroscopy, and percutaneous nephrolithotomy. Based on the size of his stone I think the best chance of him having 1 procedure in being stone free as with PCNL.   As it relates to the surgery itself I discussed the fact that the patient would be prone, and explained the renal access component. We then discussed the stone removal process as well as a postprocedure stent placement. I discussed the  risks of this operation which predominantly include bleeding and damage to the surrounding structures. I also described for him the possibility of difficulty obtaining access which may require an additional surgery. After going through surgery, potential complications, and expected outcome, the patient has agreed to proceed with the operation.   We will get this done at his convenience.

## 2021-02-11 NOTE — Op Note (Signed)
Pre-operative diagnosis: left renal pelvis stones, 2.7cm Post-operative diagnosis: as above   Procedure performed: cystoscopy, left retrograde pyelogram with interpretation, left percutaneous renal access, left nephrolithotomy, left nephrostogram,   Left ureteral stent     Surgeon: Dr. [name]  Assistant: none  Anesthesia: General  Complications: None  Specimens: The majority of the stones were removed and will be sent to the [facility] lab for further analysis.  Findings: 1. Large pelvic stone 2. Lower pole access, presumed stone free at the end of the case   EBL: Approximately 100 cc  Specimens: stone from collecting system - taken to [facility]  Indication: Kristopher [name] is a 70 y.o. patient with a history of nephrolithiasis with a large stone burden in the left kidney. After reviewing the management options for treatment, he elected to proceed with the above surgical procedure(s). We have discussed the potential benefits and risks of the procedure, side effects of the proposed treatment, the likelihood of the patient achieving the goals of the procedure, and any potential problems that might occur during the procedure or recuperation. Informed consent has been obtained.   Description:  Consent was obtained in the preoperative holding area. The patient was marked appropriately and then taken back to the operating room where he was intubated on the gurney. The patient was flipped prone onto the split leg OR table. Large jelly rolls were placed in the anterior axillary line on both sides allowing the patient's chest and abdomen to fall inbetween. The patient was then prepped and draped in the routine sterile fashion in the left flank and genital area. A timeout was then held confirming the proper side and procedure as well as antibiotics were administered.  I then used the flexible cystoscope and passed gently into the patient's urethra under visual  guidance. Once into the bladder I grasped the stent emanating from the patient's left ureteral orifice and pull it to the urethral meatus. I then passed a wire through the stent and up into the left collecting system. I then removed the stent over the wire and exchanged for a 5 [place] open-ended ureteral Pollock catheter. The Pollack catheter was then advanced up to the UPJ. The wire was then removed and a retrograde pyelogram was performed with the above findings. I then turned my attention to the patient's left flank and obtaining percutaneous renal access.  I then used the flexible cystoscope and passed gently into the patient's urethra under visual guidance. Once into the bladder I grasped the stent emanating from the patient's left ureteral orifice and pull it to the urethral meatus. I then passed a wire through the stent and up into the left collecting system. I then removed the stent over the wire and exchanged for a 5 [place] open-ended ureteral Pollock catheter. The Pollack catheter was then advanced up to the UPJ. The wire was then removed and a retrograde pyelogram was performed with the above findings. I then turned my attention to the patient's left flank and obtaining percutaneous renal access.  Using the C-arm rotated at 25 and the bulls-eye technique with an 18-gauge coaxial needle the lower posterior lateral calyx was targeted. Then rotating the C-arm AP depth of our needle was noted to be within the calyx and the inner part of the coaxial needle was removed. Urine was noted to return. A 0.038 sensor wire was then passed through the sheath of the coaxial needle and into the left renal collecting system. The wire was  then passed down the ureter and into the bladder using fluoroscopic guide and the sheath of the needle was removed.  An angiographic catheter was then advanced into the bladder and the wire removed.  A Super Stiff wire was then passed into the angiographic catheter and the  angiographic catheter removed.  A dual-lumen catheter was then advanced over the wire and a second Super Stiff wire placed into the patient's bladder under fluoroscopic guidance, establishing 2 superstiff wires through the targeted calyx and into the bladder.   The 55 French NephroMax balloon was then passed over one of the Super Stiff wires and the tip guided down into the targeted calyx. The balloon was then inflated to approximately 12 atm, and once there was no waist noted under fluoroscopy the access sheath was advanced over the balloon. The balloon was then removed. The wires were then placed back into the sheaths and snapped to the drape.   Using the rigid nephroscope to explore the targeted calyx and kidney.  The stone was located within the renal pelvis. Then using a flexible cystoscope to navigate the remaining calyces of the kidney multiple smaller stone fragments encountered.  Using the 0 tip basket these fragments were grabbed and removed. Contrast was injected through the cystoscope and the calyces systematically inspected under fluoroscopic guidance to ensure that all stone fragments had been removed.   Then using the flexible ureteroscope the ureter was navigated in antegrade fashion. All stone fragments were pushed from the ureter into the bladder. Once the ureter was clear the scope was advanced into the bladder and a 0.038 sensor wire was left in the bladder and the scope backed out over wire. The sensor wire was then backloaded over the rigid nephroscope using the stent pusher and a 26 cm x 6 French double-J ureteral stent was passed antegrade over the sensor wire down into the bladder under fluoroscopic guidance. Once the stent was in the bladder the wire was gently pulled back and a nice curl noted in the bladder. The wire completely removed from the stent, and nice curl on the proximal end of the stent was noted in the renal pelvis. The sheath was then backed out slowly to ensure that  all calyces had been inspected and there was nothing behind the sheath.   A 74F ainsworth tip catheter was then passed over one of the Super Stiff wires through the sheath and into the renal pelvis. The sheath was then backed out of the kidney and cut over the red rubber catheter. A nephrostogram was then performed confirming the position of our nephrostomy tube and reassuring that there were no longer any filling defects from the patient's symptoms.   After several minutes of direct pressure and observation was noted that there was no significant bleeding from the nephrostomy tube or around the nephrostomy tube tract. As such, I remove the nephrostomy tube as well as the safety wire. 25 cc of local anesthesia was then injected into the patient's wound, and the wound was closed with 3-0 nylon in 2 vertical mattress sutures. The incision was then padded using a bundle of 4 x 4's and Hypafix tape. Patient was subsequently rolled over to the supine position and extubated. The patient was returned to the PACU in excellent condition. At the end of the case all lap and needle and sponges were accounted for. There are no perioperative complications.

## 2021-02-11 NOTE — Plan of Care (Signed)

## 2021-02-12 ENCOUNTER — Encounter (HOSPITAL_COMMUNITY): Payer: Self-pay | Admitting: Urology

## 2021-02-12 DIAGNOSIS — Z7984 Long term (current) use of oral hypoglycemic drugs: Secondary | ICD-10-CM | POA: Diagnosis not present

## 2021-02-12 DIAGNOSIS — Z7982 Long term (current) use of aspirin: Secondary | ICD-10-CM | POA: Diagnosis not present

## 2021-02-12 DIAGNOSIS — I4891 Unspecified atrial fibrillation: Secondary | ICD-10-CM | POA: Diagnosis not present

## 2021-02-12 DIAGNOSIS — Z87891 Personal history of nicotine dependence: Secondary | ICD-10-CM | POA: Diagnosis not present

## 2021-02-12 DIAGNOSIS — E119 Type 2 diabetes mellitus without complications: Secondary | ICD-10-CM | POA: Diagnosis not present

## 2021-02-12 DIAGNOSIS — Z955 Presence of coronary angioplasty implant and graft: Secondary | ICD-10-CM | POA: Diagnosis not present

## 2021-02-12 DIAGNOSIS — N200 Calculus of kidney: Secondary | ICD-10-CM | POA: Diagnosis not present

## 2021-02-12 LAB — CBC
HCT: 31.2 % — ABNORMAL LOW (ref 39.0–52.0)
Hemoglobin: 10.2 g/dL — ABNORMAL LOW (ref 13.0–17.0)
MCH: 31.2 pg (ref 26.0–34.0)
MCHC: 32.7 g/dL (ref 30.0–36.0)
MCV: 95.4 fL (ref 80.0–100.0)
Platelets: 157 10*3/uL (ref 150–400)
RBC: 3.27 MIL/uL — ABNORMAL LOW (ref 4.22–5.81)
RDW: 19.1 % — ABNORMAL HIGH (ref 11.5–15.5)
WBC: 10.4 10*3/uL (ref 4.0–10.5)
nRBC: 0 % (ref 0.0–0.2)

## 2021-02-12 LAB — BASIC METABOLIC PANEL
Anion gap: 7 (ref 5–15)
BUN: 18 mg/dL (ref 8–23)
CO2: 28 mmol/L (ref 22–32)
Calcium: 8.9 mg/dL (ref 8.9–10.3)
Chloride: 104 mmol/L (ref 98–111)
Creatinine, Ser: 0.84 mg/dL (ref 0.61–1.24)
GFR, Estimated: >60 mL/min (ref >60)
Glucose, Bld: 176 mg/dL — ABNORMAL HIGH (ref 70–99)
Potassium: 3.9 mmol/L (ref 3.5–5.1)
Sodium: 139 mmol/L (ref 135–145)

## 2021-02-12 LAB — GLUCOSE, CAPILLARY
Glucose-Capillary: 147 mg/dL — ABNORMAL HIGH (ref 70–99)
Glucose-Capillary: 190 mg/dL — ABNORMAL HIGH (ref 70–99)
Glucose-Capillary: 193 mg/dL — ABNORMAL HIGH (ref 70–99)
Glucose-Capillary: 239 mg/dL — ABNORMAL HIGH (ref 70–99)

## 2021-02-12 NOTE — Discharge Summary (Addendum)
[facility] Discharge Summary  Admit date: 02/11/2021  Discharge date and time: 02/12/21   Discharge to: Home  Discharge Service: Urology  Discharge Attending Physician:  Dr. Rexene Alberts  Discharge  Diagnoses: Nephrolithiasis  Secondary Diagnosis: Active Problems:   Nephrolithiasis   OR Procedures: Procedure(s): LEFT NEPHROLITHOTOMY PERCUTANEOUS WITH SURGEON ACCESS 02/11/2021   Ancillary Procedures: None   Discharge Day Services: The patient was seen and examined by the Urology team both in the morning and immediately prior to discharge.  Vital signs and laboratory values were stable and within normal limits.  The physical exam was benign and unchanged and all surgical wounds were examined.  Discharge instructions were explained and all questions answered.  Subjective  No acute events overnight. Pain Controlled. No fever or chills.  Objective Patient Vitals for the past 8 hrs:  BP Temp Temp src Pulse Resp SpO2  02/12/21 0123 121/73 98.1 F (36.7 C) Oral 85 18 94 %   No intake/output data recorded.  General Appearance:        No acute distress Lungs:                       Normal work of breathing on room air Heart:                                Regular rate and rhythm Abdomen:                         Soft, non-tender, non-distended Left flank:       Left flank puncture wound c/d/I with non-soiled overlying dressing Extremities:                      Warm and well perfused   Hospital Course:  The patient underwent left PCNL and left ureteral stent placement on 02/11/2021.  The patient tolerated the procedure well, was extubated in the OR, and afterwards was taken to the PACU for routine post-surgical care. When stable the patient was transferred to the floor.   The patient did well postoperatively.  The patient's diet was slowly advanced and at the time of discharge was tolerating a regular diet.  The patient was discharged home 1 Day Post-Op, at which point was  tolerating a regular solid diet, was able to void spontaneously, have adequate pain control with P.O. pain medication, and could ambulate without difficulty. The patient will follow up with [place] for post op check.   Condition at Discharge: Improved  Discharge Medications:  Allergies as of 02/12/2021       Reactions   Codeine Nausea And Vomiting   Fentanyl Nausea And Vomiting   Naproxen Nausea Only   Other Nausea And Vomiting   1. N/V to all opiates. He is able to take versed. (noted 09/29/13) 2.The patient states that he has had  a reaction to all narcotics. He can take them though. (noted 06/26/13)        Medication List     STOP taking these medications    clopidogrel 75 MG tablet Commonly known as: PLAVIX       TAKE these medications    aspirin EC 81 MG tablet Take 81 mg by mouth every evening.   baclofen 10 MG tablet Commonly known as: LIORESAL Take 1 tablet (10 mg total) by mouth 3 (three) times daily.   buPROPion 300 MG 24  hr tablet Commonly known as: WELLBUTRIN XL Take 300 mg by mouth daily.   Coenzyme Q10 100 MG Tabs Take 100 mg by mouth every evening.   diclofenac Sodium 1 % Gel Commonly known as: VOLTAREN Apply 2 g topically daily as needed (back pain).   diltiazem 120 MG 12 hr capsule Commonly known as: CARDIZEM SR Take 120 mg by mouth See admin instructions. Take 120 mg by mouth every 36 hours   gabapentin 300 MG capsule Commonly known as: NEURONTIN Take 600 mg by mouth 4 (four) times daily.   ibuprofen 200 MG tablet Commonly known as: ADVIL Take 800 mg by mouth every 6 (six) hours as needed for moderate pain.   metFORMIN 500 MG 24 hr tablet Commonly known as: GLUCOPHAGE-XR Take 1,000 mg by mouth 2 (two) times daily.   niacin 500 MG tablet Take 500 mg by mouth daily.   nitroGLYCERIN 0.4 MG SL tablet Commonly known as: NITROSTAT Place 0.4 mg under the tongue every 5 (five) minutes as needed for chest pain.   pantoprazole 40 MG  tablet Commonly known as: PROTONIX Take 40 mg by mouth daily.   pioglitazone 15 MG tablet Commonly known as: ACTOS Take 15 mg by mouth daily.   promethazine 25 MG tablet Commonly known as: PHENERGAN Take 25 mg by mouth every 6 (six) hours as needed for nausea or vomiting.   rosuvastatin 5 MG tablet Commonly known as: CRESTOR Take 5 mg by mouth every evening.   traMADol 50 MG tablet Commonly known as: Ultram Take 1-2 tablets (50-100 mg total) by mouth every 6 (six) hours as needed for moderate pain.       [facility]  Pager: [phone]

## 2021-02-12 NOTE — Progress Notes (Signed)
3244- Sitting in chair in nad. Pt is alert and oriented x4. Lungs clear bilaterally. AP regular. Abdomen soft and nondistended. Bowel sounds active x4 quadrants. Surgical site to left mid back covered with bulky gauze dressing and tegaderm. Dressing is clean, dry, and intact. Pt voided 280m bloody urine without difficulty. 1+ edema to LLE. Denies pain, no complaints voiced. Call bell within reach.   00102 Medications administered per order. Will continue to monitor.  165 Pt oob ambulating ad lib with wife. C/o 7/10 surgical pain and pt medicated with scheduled IV Tylenol and prn Ultram 1035mpo.  1345- Discharge instructions reviewed with pt and pt's wife. New medications, changes, and important dates highlighted. Pt taken out to car via w/c by this nurse. No distress noted.

## 2021-02-16 LAB — CALCULI, WITH PHOTOGRAPH (CLINICAL LAB)
Calcium Oxalate Dihydrate: 10 %
Calcium Oxalate Monohydrate: 85 %
Hydroxyapatite: 5 %
Weight Calculi: 529 mg

## 2021-02-17 DIAGNOSIS — N200 Calculus of kidney: Secondary | ICD-10-CM | POA: Diagnosis not present

## 2021-02-17 DIAGNOSIS — R338 Other retention of urine: Secondary | ICD-10-CM | POA: Diagnosis not present

## 2021-02-19 ENCOUNTER — Emergency Department (HOSPITAL_COMMUNITY)
Admission: EM | Admit: 2021-02-19 | Discharge: 2021-02-20 | Disposition: A | Discharge status: Home / Self Care | Payer: Medicare HMO | Source: Home / Self Care | Attending: Emergency Medicine | Admitting: Emergency Medicine

## 2021-02-19 ENCOUNTER — Emergency Department (HOSPITAL_COMMUNITY): Payer: Medicare HMO

## 2021-02-19 ENCOUNTER — Encounter (HOSPITAL_COMMUNITY): Payer: Self-pay

## 2021-02-19 DIAGNOSIS — D649 Anemia, unspecified: Secondary | ICD-10-CM | POA: Diagnosis not present

## 2021-02-19 DIAGNOSIS — I6782 Cerebral ischemia: Secondary | ICD-10-CM | POA: Diagnosis not present

## 2021-02-19 DIAGNOSIS — R0600 Dyspnea, unspecified: Secondary | ICD-10-CM | POA: Diagnosis not present

## 2021-02-19 DIAGNOSIS — N200 Calculus of kidney: Secondary | ICD-10-CM | POA: Diagnosis not present

## 2021-02-19 DIAGNOSIS — N179 Acute kidney failure, unspecified: Secondary | ICD-10-CM | POA: Diagnosis present

## 2021-02-19 DIAGNOSIS — N281 Cyst of kidney, acquired: Secondary | ICD-10-CM | POA: Diagnosis not present

## 2021-02-19 DIAGNOSIS — Z7982 Long term (current) use of aspirin: Secondary | ICD-10-CM | POA: Insufficient documentation

## 2021-02-19 DIAGNOSIS — R29818 Other symptoms and signs involving the nervous system: Secondary | ICD-10-CM | POA: Diagnosis not present

## 2021-02-19 DIAGNOSIS — E871 Hypo-osmolality and hyponatremia: Secondary | ICD-10-CM | POA: Diagnosis not present

## 2021-02-19 DIAGNOSIS — Z20822 Contact with and (suspected) exposure to covid-19: Secondary | ICD-10-CM | POA: Diagnosis present

## 2021-02-19 DIAGNOSIS — I2510 Atherosclerotic heart disease of native coronary artery without angina pectoris: Secondary | ICD-10-CM | POA: Diagnosis not present

## 2021-02-19 DIAGNOSIS — I700 Atherosclerosis of aorta: Secondary | ICD-10-CM | POA: Diagnosis not present

## 2021-02-19 DIAGNOSIS — T8579XD Infection and inflammatory reaction due to other internal prosthetic devices, implants and grafts, subsequent encounter: Secondary | ICD-10-CM | POA: Diagnosis not present

## 2021-02-19 DIAGNOSIS — I110 Hypertensive heart disease with heart failure: Secondary | ICD-10-CM | POA: Diagnosis present

## 2021-02-19 DIAGNOSIS — R338 Other retention of urine: Secondary | ICD-10-CM

## 2021-02-19 DIAGNOSIS — D696 Thrombocytopenia, unspecified: Secondary | ICD-10-CM | POA: Diagnosis present

## 2021-02-19 DIAGNOSIS — R0609 Other forms of dyspnea: Secondary | ICD-10-CM | POA: Diagnosis not present

## 2021-02-19 DIAGNOSIS — I2583 Coronary atherosclerosis due to lipid rich plaque: Secondary | ICD-10-CM | POA: Diagnosis not present

## 2021-02-19 DIAGNOSIS — M79631 Pain in right forearm: Secondary | ICD-10-CM | POA: Diagnosis not present

## 2021-02-19 DIAGNOSIS — N2889 Other specified disorders of kidney and ureter: Secondary | ICD-10-CM | POA: Diagnosis not present

## 2021-02-19 DIAGNOSIS — J810 Acute pulmonary edema: Secondary | ICD-10-CM | POA: Diagnosis not present

## 2021-02-19 DIAGNOSIS — Z7902 Long term (current) use of antithrombotics/antiplatelets: Secondary | ICD-10-CM | POA: Insufficient documentation

## 2021-02-19 DIAGNOSIS — B952 Enterococcus as the cause of diseases classified elsewhere: Secondary | ICD-10-CM | POA: Diagnosis not present

## 2021-02-19 DIAGNOSIS — I25110 Atherosclerotic heart disease of native coronary artery with unstable angina pectoris: Secondary | ICD-10-CM | POA: Insufficient documentation

## 2021-02-19 DIAGNOSIS — A4181 Sepsis due to Enterococcus: Secondary | ICD-10-CM | POA: Diagnosis present

## 2021-02-19 DIAGNOSIS — Z1624 Resistance to multiple antibiotics: Secondary | ICD-10-CM | POA: Diagnosis present

## 2021-02-19 DIAGNOSIS — S0990XA Unspecified injury of head, initial encounter: Secondary | ICD-10-CM | POA: Diagnosis not present

## 2021-02-19 DIAGNOSIS — R39198 Other difficulties with micturition: Secondary | ICD-10-CM | POA: Diagnosis not present

## 2021-02-19 DIAGNOSIS — K7460 Unspecified cirrhosis of liver: Secondary | ICD-10-CM | POA: Diagnosis present

## 2021-02-19 DIAGNOSIS — N139 Obstructive and reflux uropathy, unspecified: Secondary | ICD-10-CM | POA: Diagnosis present

## 2021-02-19 DIAGNOSIS — M79601 Pain in right arm: Secondary | ICD-10-CM | POA: Diagnosis not present

## 2021-02-19 DIAGNOSIS — R1030 Lower abdominal pain, unspecified: Secondary | ICD-10-CM | POA: Insufficient documentation

## 2021-02-19 DIAGNOSIS — D62 Acute posthemorrhagic anemia: Secondary | ICD-10-CM | POA: Diagnosis present

## 2021-02-19 DIAGNOSIS — G4733 Obstructive sleep apnea (adult) (pediatric): Secondary | ICD-10-CM | POA: Diagnosis not present

## 2021-02-19 DIAGNOSIS — I10 Essential (primary) hypertension: Secondary | ICD-10-CM | POA: Diagnosis not present

## 2021-02-19 DIAGNOSIS — Y732 Prosthetic and other implants, materials and accessory gastroenterology and urology devices associated with adverse incidents: Secondary | ICD-10-CM | POA: Diagnosis present

## 2021-02-19 DIAGNOSIS — N390 Urinary tract infection, site not specified: Secondary | ICD-10-CM | POA: Diagnosis present

## 2021-02-19 DIAGNOSIS — R0902 Hypoxemia: Secondary | ICD-10-CM | POA: Diagnosis not present

## 2021-02-19 DIAGNOSIS — I248 Other forms of acute ischemic heart disease: Secondary | ICD-10-CM | POA: Diagnosis not present

## 2021-02-19 DIAGNOSIS — I5033 Acute on chronic diastolic (congestive) heart failure: Secondary | ICD-10-CM | POA: Diagnosis present

## 2021-02-19 DIAGNOSIS — E119 Type 2 diabetes mellitus without complications: Secondary | ICD-10-CM | POA: Insufficient documentation

## 2021-02-19 DIAGNOSIS — R531 Weakness: Secondary | ICD-10-CM | POA: Diagnosis not present

## 2021-02-19 DIAGNOSIS — J9601 Acute respiratory failure with hypoxia: Secondary | ICD-10-CM | POA: Diagnosis not present

## 2021-02-19 DIAGNOSIS — I82611 Acute embolism and thrombosis of superficial veins of right upper extremity: Secondary | ICD-10-CM | POA: Diagnosis not present

## 2021-02-19 DIAGNOSIS — J80 Acute respiratory distress syndrome: Secondary | ICD-10-CM | POA: Diagnosis present

## 2021-02-19 DIAGNOSIS — R739 Hyperglycemia, unspecified: Secondary | ICD-10-CM | POA: Diagnosis not present

## 2021-02-19 DIAGNOSIS — E7800 Pure hypercholesterolemia, unspecified: Secondary | ICD-10-CM | POA: Diagnosis present

## 2021-02-19 DIAGNOSIS — A419 Sepsis, unspecified organism: Secondary | ICD-10-CM | POA: Diagnosis not present

## 2021-02-19 DIAGNOSIS — Z87891 Personal history of nicotine dependence: Secondary | ICD-10-CM | POA: Insufficient documentation

## 2021-02-19 DIAGNOSIS — K6389 Other specified diseases of intestine: Secondary | ICD-10-CM | POA: Diagnosis not present

## 2021-02-19 DIAGNOSIS — I4891 Unspecified atrial fibrillation: Secondary | ICD-10-CM | POA: Diagnosis not present

## 2021-02-19 DIAGNOSIS — M7989 Other specified soft tissue disorders: Secondary | ICD-10-CM | POA: Diagnosis not present

## 2021-02-19 DIAGNOSIS — G9340 Encephalopathy, unspecified: Secondary | ICD-10-CM | POA: Diagnosis not present

## 2021-02-19 DIAGNOSIS — Z1612 Extended spectrum beta lactamase (ESBL) resistance: Secondary | ICD-10-CM | POA: Diagnosis present

## 2021-02-19 DIAGNOSIS — Z79899 Other long term (current) drug therapy: Secondary | ICD-10-CM | POA: Insufficient documentation

## 2021-02-19 DIAGNOSIS — R7881 Bacteremia: Secondary | ICD-10-CM | POA: Diagnosis not present

## 2021-02-19 DIAGNOSIS — I959 Hypotension, unspecified: Secondary | ICD-10-CM | POA: Diagnosis not present

## 2021-02-19 DIAGNOSIS — R339 Retention of urine, unspecified: Secondary | ICD-10-CM | POA: Insufficient documentation

## 2021-02-19 DIAGNOSIS — T83511A Infection and inflammatory reaction due to indwelling urethral catheter, initial encounter: Secondary | ICD-10-CM | POA: Diagnosis present

## 2021-02-19 DIAGNOSIS — R000 Tachycardia, unspecified: Secondary | ICD-10-CM | POA: Diagnosis not present

## 2021-02-19 DIAGNOSIS — E876 Hypokalemia: Secondary | ICD-10-CM | POA: Diagnosis not present

## 2021-02-19 DIAGNOSIS — E1165 Type 2 diabetes mellitus with hyperglycemia: Secondary | ICD-10-CM | POA: Diagnosis not present

## 2021-02-19 DIAGNOSIS — I4892 Unspecified atrial flutter: Secondary | ICD-10-CM | POA: Diagnosis not present

## 2021-02-19 DIAGNOSIS — R0602 Shortness of breath: Secondary | ICD-10-CM | POA: Diagnosis not present

## 2021-02-19 DIAGNOSIS — G319 Degenerative disease of nervous system, unspecified: Secondary | ICD-10-CM | POA: Diagnosis not present

## 2021-02-19 DIAGNOSIS — R509 Fever, unspecified: Secondary | ICD-10-CM | POA: Diagnosis not present

## 2021-02-19 DIAGNOSIS — Z7984 Long term (current) use of oral hypoglycemic drugs: Secondary | ICD-10-CM | POA: Insufficient documentation

## 2021-02-19 DIAGNOSIS — K7581 Nonalcoholic steatohepatitis (NASH): Secondary | ICD-10-CM | POA: Diagnosis present

## 2021-02-19 DIAGNOSIS — A499 Bacterial infection, unspecified: Secondary | ICD-10-CM | POA: Diagnosis not present

## 2021-02-19 DIAGNOSIS — A498 Other bacterial infections of unspecified site: Secondary | ICD-10-CM | POA: Diagnosis not present

## 2021-02-19 DIAGNOSIS — Z466 Encounter for fitting and adjustment of urinary device: Secondary | ICD-10-CM | POA: Diagnosis not present

## 2021-02-19 DIAGNOSIS — K7469 Other cirrhosis of liver: Secondary | ICD-10-CM | POA: Diagnosis not present

## 2021-02-19 DIAGNOSIS — G928 Other toxic encephalopathy: Secondary | ICD-10-CM | POA: Diagnosis not present

## 2021-02-19 DIAGNOSIS — K5730 Diverticulosis of large intestine without perforation or abscess without bleeding: Secondary | ICD-10-CM | POA: Diagnosis not present

## 2021-02-19 DIAGNOSIS — R6520 Severe sepsis without septic shock: Secondary | ICD-10-CM | POA: Diagnosis present

## 2021-02-19 DIAGNOSIS — R319 Hematuria, unspecified: Secondary | ICD-10-CM | POA: Diagnosis not present

## 2021-02-19 MED ORDER — LIDOCAINE HCL URETHRAL/MUCOSAL 2 % EX GEL
1.0000 "application " | Freq: Once | CUTANEOUS | Status: AC
  Administered 2021-02-19: 1 via URETHRAL
  Filled 2021-02-19: qty 11

## 2021-02-19 NOTE — ED Triage Notes (Signed)
Pt presents with c/o post-op problem. Pt reports that he had surgery on 8/25 on his kidney and the catheter came out today after passing a large clot. Pt reports he has been unable to urinate since 8 pm.

## 2021-02-19 NOTE — ED Notes (Signed)
Bladder scan obtained and two readings as follows:  >472 >374 Pt feeling pressure in his bladder. Provider made aware

## 2021-02-19 NOTE — ED Provider Notes (Signed)
[facility] DEPT Provider Note   CSN: [id number] Arrival date & time: 02/19/21  2224     History Chief Complaint  Patient presents with   Post-op Problem    Kristopher [name] is a 70 y.o. male.  Patient presents to the emergency department for evaluation of urinary retention.  Patient had a urologic procedure on August 25 for a large kidney stone.  Patient has a ureteral stent in place.  Patient reports that he has been passing blood since the procedure.  He had an episode of urinary retention and was seen in the office, had a Foley catheter placed.  Tonight the catheter stopped working around 8 PM.  He has been experiencing increased pressure and discomfort since.  He did remove the catheter himself at home.  He has still not been able to urinate.      Past Medical History:  Diagnosis Date   Anemia 12/2020   Anginal pain ([place])    Atrial fibrillation ([place])    a. s/p PVI X2 by Dr Omelia Blackwater at [facility] around 2000.   CAD (coronary artery disease)    a. NSTEMI (06/2013):  LHC (06/27/2013):  pLAD 10-20, RCA 90, EF 45-50%, inf HK.  PCI:  Xience Alpine (3 x 23 mm) DES to the RCA. b. Relook cath 07/10/13 - patent stent. Imdur added. c. Recurrent CP after that - abnormal nuc 07/2017 with chest pain, dyspnea, hypotensive response. Rest images with mid-basal inferior thinning. No stress images due to abrupt d/c of test. Relook cath 3/12 - no significant r   Chronic back pain greater than 3 months duration    Cirrhosis, non-alcoholic (HCC)    Depression    DJD (degenerative joint disease) of cervical spine    "3 herniated discs" (09/01/2013)   DJD (degenerative joint disease), lumbosacral    "bulging L5-S1" (09/01/2013)   Dysrhythmia    PVCs   Herniated cervical disc    c2-T1 fusion   HLD (hyperlipidemia)    Ischemic cardiomyopathy    a. Echo (06/28/2011): Inferior and inferoseptal akinesis, EF 44%, grade 2 diastolic dysfunction, MAC, small effusion (no tamponade). B. EF 40%  by echo 06/2013, cath 07/2013, 55% by cath 08/2013 with subtle mild mid inferior hypocontractility.   Liver cirrhosis secondary to NASH [facility])    NSTEMI (non-ST elevated myocardial infarction) ([place]) 06/27/2013   Osteoarthritis    "neck and lower back" (09/01/2013)   Pericardial effusion    a. Chronic since hx of pericarditis in 2004. b. Mod by echo 06/2013, small by echo 06/2013.   Pneumonia ~ 2005   Type II diabetes mellitus [facility])     Patient Active Problem List   Diagnosis Date Noted   Nephrolithiasis 02/11/2021   Syncope 09/18/2019   Encounter for general adult medical examination with abnormal findings 04/30/2018   Muscle spasm 04/30/2018   Dysuria 04/30/2018   Cirrhosis of liver without ascites ([place]) 12/17/2015   Pericardial effusion 07/22/2014   Restless legs syndrome 06/26/2014   Other amnesia 06/04/2014   Coronary artery disease involving native coronary artery of native heart without angina pectoris 04/13/2014   Cardiomyopathy, ischemic 04/13/2014   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   Obstructive sleep apnea syndrome 03/12/2014   Acute pericarditis, unspecified - possible 09/04/2013   Exertional chest pain 09/04/2013   Chest pain with moderate risk for cardiac etiology - etiology likely pericarditis 09/01/2013   Abnormal exercise tolerance test 08/26/2013   Depression 08/07/2013   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  Diabetes mellitus, type 2 07/17/2013   Type 2 diabetes mellitus without complications ([place]) 76/72/0947   Unstable angina ([place]) 07/09/2013   Atrial fibrillation s/p PVI ablation at [facility] 2001    Non-ST elevation myocardial infarction (NSTEMI), subendocardial infarction, subsequent episode of care [facility]) 06/27/2013    Class: History of    Past Surgical History:  Procedure Laterality Date   APPENDECTOMY  ~ 1965   ATRIAL FIBRILLATION ABLATION  2000   Dr Omelia Blackwater at [place] N/A  10/07/2013   Procedure: LEFT AND RIGHT HEART CATHETERIZATION WITH CORONARY ANGIOGRAM;  Surgeon: Troy Sine, MD;  Location: [facility] CATH LAB;  Service: Cardiovascular;  Laterality: N/A;   LEFT HEART CATHETERIZATION WITH CORONARY ANGIOGRAM N/A 06/27/2013   Procedure: LEFT HEART CATHETERIZATION WITH CORONARY ANGIOGRAM;  Surgeon: Troy Sine, MD;  Location: [facility] CATH LAB;  Service: Cardiovascular;  Laterality: N/A;   LEFT HEART CATHETERIZATION WITH CORONARY ANGIOGRAM N/A 07/10/2013   Procedure: LEFT HEART CATHETERIZATION WITH CORONARY ANGIOGRAM;  Surgeon: Burnell Blanks, MD;  Location: [facility] CATH LAB;  Service: Cardiovascular;  Laterality: N/A;   LEFT HEART CATHETERIZATION WITH CORONARY ANGIOGRAM N/A 08/28/2013   Procedure: LEFT HEART CATHETERIZATION WITH CORONARY ANGIOGRAM;  Surgeon: Troy Sine, MD;  Location: [facility] CATH LAB;  Service: Cardiovascular;  Laterality: N/A;   NEPHROLITHOTOMY Left 02/11/2021   Procedure: LEFT NEPHROLITHOTOMY PERCUTANEOUS WITH SURGEON ACCESS;  Surgeon: [name], MD;  Location: [facility] ORS;  Service: Urology;  Laterality: Left;   PERCUTANEOUS CORONARY STENT INTERVENTION (PCI-S)  06/27/2013   Procedure: PERCUTANEOUS CORONARY STENT INTERVENTION (PCI-S);  Surgeon: Troy Sine, MD;  Location: [facility] CATH LAB;  Service: Cardiovascular;;   POSTERIOR CERVICAL FUSION/FORAMINOTOMY  2009   fusion c3-t1   PULMONARY VEIN ABLATION  2001   Dr Omelia Blackwater at [place]       Family History  Problem Relation Age of Onset   COPD Other    Cancer Other    Diabetes Other    Heart disease Other    CAD Neg Hx        No hx early CAD    Social History   Tobacco Use   Smoking status: Former    Packs/day: 1.50    Years: 43.00    Pack years: 64.50    Types: Cigarettes    Quit date: 05/06/2007    Years since quitting: 13.8   Smokeless tobacco: Never  Vaping Use   Vaping Use: Never used  Substance Use Topics   Alcohol use: Yes    Comment: once monthly/rarely    Drug use: No    Home Medications Prior to Admission medications   Medication Sig Start Date End Date Taking? Authorizing Provider  aspirin EC 81 MG tablet Take 81 mg by mouth every evening.    Yes [provider]  baclofen (LIORESAL) 10 MG tablet Take 1 tablet (10 mg total) by mouth 3 (three) times daily. 10/16/18  Yes Boscia, Greer Ee, NP  buPROPion (WELLBUTRIN XL) 300 MG 24 hr tablet Take 300 mg by mouth daily. 08/08/19  Yes [provider]  clopidogrel (PLAVIX) 75 MG tablet Take 75 mg by mouth daily.   Yes [provider]  diltiazem (CARDIZEM SR) 120 MG 12 hr capsule Take 120 mg by mouth See admin instructions. Take 120 mg by mouth every 36 hours 01/21/21  Yes [provider]  gabapentin (NEURONTIN) 600 MG tablet Take 600 mg by  mouth 2 (two) times daily. 02/17/21  Yes [provider]  ibuprofen (ADVIL) 200 MG tablet Take 800 mg by mouth every 6 (six) hours as needed for moderate pain.   Yes [provider]  metFORMIN (GLUCOPHAGE-XR) 500 MG 24 hr tablet Take 1,000 mg by mouth 2 (two) times daily. 07/25/19  Yes [provider]  niacin 500 MG tablet Take 500 mg by mouth daily.   Yes [provider]  nitroGLYCERIN (NITROSTAT) 0.4 MG SL tablet Place 0.4 mg under the tongue every 5 (five) minutes as needed for chest pain.   Yes [provider]  pantoprazole (PROTONIX) 40 MG tablet Take 40 mg by mouth 2 (two) times daily. 08/08/19  Yes [provider]  pioglitazone (ACTOS) 15 MG tablet Take 15 mg by mouth daily. 07/14/19  Yes [provider]  promethazine (PHENERGAN) 25 MG tablet Take 25 mg by mouth every 6 (six) hours as needed for nausea or vomiting. 07/14/19  Yes [provider]  rOPINIRole (REQUIP) 1 MG tablet Take 1 mg by mouth at bedtime. 02/07/21  Yes [provider]  rosuvastatin (CRESTOR) 5 MG tablet Take 5 mg by mouth every evening. 10/07/15  Yes [provider]  tamsulosin  (FLOMAX) 0.4 MG CAPS capsule Take 0.4 mg by mouth every evening. 02/17/21  Yes [provider]  traMADol (ULTRAM) 50 MG tablet Take 1-2 tablets (50-100 mg total) by mouth every 6 (six) hours as needed for moderate pain. 02/11/21  Yes [name], MD  cyanocobalamin (,VITAMIN B-12,) 1000 MCG/ML injection Inject 1,000 mcg into the muscle as directed. 1ML INTRAMUSCULAR ROUTE DAILY FOR ONE WEEK, THEN ONCE WEEKLY FOR ONE MONTH, THEN 1ML MONTHLY 02/11/21   [provider]    Allergies    Codeine, Fentanyl, Naproxen, and Other  Review of Systems   Review of Systems  Genitourinary:  Positive for decreased urine volume and hematuria.  All other systems reviewed and are negative.  Physical Exam Updated Vital Signs BP 131/78   Pulse 93   Temp 98.7 F (37.1 C) (Oral)   Resp 14   Ht _0  (1.702 m)   Wt 72.6 kg   SpO2 97%   BMI 25.06 kg/m   Physical Exam Vitals and nursing note reviewed.  Constitutional:      General: He is not in acute distress.    Appearance: Normal appearance. He is well-developed.  HENT:     Head: Normocephalic and atraumatic.     Right Ear: Hearing normal.     Left Ear: Hearing normal.     Nose: Nose normal.  Eyes:     Conjunctiva/sclera: Conjunctivae normal.     Pupils: Pupils are equal, round, and reactive to light.  Cardiovascular:     Rate and Rhythm: Regular rhythm.     Heart sounds: S1 normal and S2 normal. No murmur heard.   No friction rub. No gallop.  Pulmonary:     Effort: Pulmonary effort is normal. No respiratory distress.     Breath sounds: Normal breath sounds.  Chest:     Chest wall: No tenderness.  Abdominal:     General: Bowel sounds are normal.     Palpations: Abdomen is soft.     Tenderness: There is abdominal tenderness in the suprapubic area. There is no guarding or rebound. Negative signs include Murphy's sign and McBurney's sign.     Hernia: No hernia is present.  Musculoskeletal:        General: Normal range  of motion.     Cervical back: Normal range of motion and neck supple.  Skin:    General: Skin is warm and dry.     Findings: No rash.  Neurological:     Mental Status: He is alert and oriented to person, place, and time.     GCS: GCS eye subscore is 4. GCS verbal subscore is 5. GCS motor subscore is 6.     Cranial Nerves: No cranial nerve deficit.     Sensory: No sensory deficit.     Coordination: Coordination normal.  Psychiatric:        Speech: Speech normal.        Behavior: Behavior normal.        Thought Content: Thought content normal.    ED Results / Procedures / Treatments   Labs (all labs ordered are listed, but only abnormal results are displayed) Labs Reviewed  CBC - Abnormal; Notable for the following components:      Result Value   RBC 2.82 (*)    Hemoglobin 9.0 (*)    HCT 26.4 (*)    RDW 18.1 (*)    All other components within normal limits  COMPREHENSIVE METABOLIC PANEL - Abnormal; Notable for the following components:   Sodium 134 (*)    Glucose, Bld 254 (*)    All other components within normal limits  URINALYSIS, ROUTINE W REFLEX MICROSCOPIC - Abnormal; Notable for the following components:   Color, Urine RED (*)    APPearance TURBID (*)    Specific Gravity, Urine <1.005 (*)    Glucose, UA   (*)    Value: TEST NOT REPORTED DUE TO COLOR INTERFERENCE OF URINE PIGMENT   Hgb urine dipstick   (*)    Value: TEST NOT REPORTED DUE TO COLOR INTERFERENCE OF URINE PIGMENT   Bilirubin Urine   (*)    Value: TEST NOT REPORTED DUE TO COLOR INTERFERENCE OF URINE PIGMENT   Ketones, ur   (*)    Value: TEST NOT REPORTED DUE TO COLOR INTERFERENCE OF URINE PIGMENT   Protein, ur   (*)    Value: TEST NOT REPORTED DUE TO COLOR INTERFERENCE OF URINE PIGMENT   Nitrite   (*)    Value: TEST NOT REPORTED DUE TO COLOR INTERFERENCE OF URINE PIGMENT   Leukocytes,Ua   (*)    Value: TEST NOT REPORTED DUE TO COLOR INTERFERENCE OF URINE PIGMENT   RBC / HPF >50 (*)    Bacteria, UA FEW  (*)    All other components within normal limits  URINE CULTURE    EKG None  Radiology CT RENAL STONE STUDY  Result Date: 02/20/2021 CLINICAL DATA:  Difficulty urinating.  Recent urologic procedure. EXAM: CT ABDOMEN AND PELVIS WITHOUT CONTRAST TECHNIQUE: Multidetector CT imaging of the abdomen and pelvis was performed following the standard protocol without IV contrast. COMPARISON:  12/14/2020 FINDINGS: LOWER CHEST: Small pericardial effusion is unchanged HEPATOBILIARY: Normal hepatic contours. No intra- or extrahepatic biliary dilatation. The gallbladder is normal. PANCREAS: Normal pancreas. No ductal dilatation or peripancreatic fluid collection. SPLEEN: Normal. ADRENALS/URINARY TRACT: The adrenal glands are normal. Left nephroureteral stent extends into the urinary bladder. There is blood within the renal collecting system. Moderate left perinephric stranding. Right kidney and ureter are normal. There is a catheter within the urinary bladder. There is blood within the dependent portion of the bladder. STOMACH/BOWEL: There is no hiatal hernia. Normal duodenal course and caliber. No small bowel dilatation or inflammation. No focal colonic abnormality.  Normal appendix. VASCULAR/LYMPHATIC: There is calcific atherosclerosis of the abdominal aorta. No lymphadenopathy. REPRODUCTIVE: Normal prostate size with symmetric seminal vesicles. MUSCULOSKELETAL. No bony spinal canal stenosis or focal osseous abnormality. OTHER: None. IMPRESSION: 1. Status post left nephroureteral stent placement with left perinephric stranding and blood within the renal collecting system and urinary bladder. 2. Unchanged small pericardial effusion. Electronically Signed   By: Ulyses Jarred M.D.   On: 02/20/2021 01:19    Procedures Procedures   Medications Ordered in ED Medications  lidocaine (XYLOCAINE) 2 % jelly 1 application (1 application Urethral Given 02/19/21 2326)    ED Course  I have reviewed the triage vital signs and  the nursing notes.  Pertinent labs & imaging results that were available during my care of the patient were reviewed by me and considered in my medical decision making (see chart for details).    MDM Rules/Calculators/A&P                           Patient presents to the emergency department with urinary retention.  Patient had intervention for a large renal stone.  He has a ureteral stent in place.  He has been having hematuria since the procedure.  He was seen in the urology office and had a Foley catheter placed.  It appears that the Foley catheter was blocked by a clot tonight, he removed it himself.  He has not been able to urinate since removal.  Foley was replaced.  He is draining now.  There is gross blood.  Hemoglobin has dropped into the 9 range from 12's initially.  This is from continuous slow bleeding seen since the procedure.  He is not symptomatic and not in need of a transfusion at this time.  CT scan shows that the stent is in place, no associated stones.  Will discharge and have him follow-up with urology in the office. Final Clinical Impression(s) / ED Diagnoses Final diagnoses:  Acute urinary retention    Rx / DC Orders ED Discharge Orders     None        Orpah Greek, MD 02/21/21 [phone]

## 2021-02-20 LAB — URINALYSIS, ROUTINE W REFLEX MICROSCOPIC
RBC / HPF: >50 RBC/hpf — ABNORMAL HIGH (ref 0–5)
Specific Gravity, Urine: <1.005 — ABNORMAL LOW (ref 1.005–1.030)
pH: 7 (ref 5.0–8.0)

## 2021-02-20 LAB — COMPREHENSIVE METABOLIC PANEL
ALT: 12 U/L (ref 0–44)
AST: 15 U/L (ref 15–41)
Albumin: 3.9 g/dL (ref 3.5–5.0)
Alkaline Phosphatase: 62 U/L (ref 38–126)
Anion gap: 11 (ref 5–15)
BUN: 16 mg/dL (ref 8–23)
CO2: 24 mmol/L (ref 22–32)
Calcium: 8.9 mg/dL (ref 8.9–10.3)
Chloride: 99 mmol/L (ref 98–111)
Creatinine, Ser: 1.21 mg/dL (ref 0.61–1.24)
GFR, Estimated: >60 mL/min (ref >60)
Glucose, Bld: 254 mg/dL — ABNORMAL HIGH (ref 70–99)
Potassium: 3.7 mmol/L (ref 3.5–5.1)
Sodium: 134 mmol/L — ABNORMAL LOW (ref 135–145)
Total Bilirubin: 0.8 mg/dL (ref 0.3–1.2)
Total Protein: 6.5 g/dL (ref 6.5–8.1)

## 2021-02-20 LAB — CBC
HCT: 26.4 % — ABNORMAL LOW (ref 39.0–52.0)
Hemoglobin: 9 g/dL — ABNORMAL LOW (ref 13.0–17.0)
MCH: 31.9 pg (ref 26.0–34.0)
MCHC: 34.1 g/dL (ref 30.0–36.0)
MCV: 93.6 fL (ref 80.0–100.0)
Platelets: 181 10*3/uL (ref 150–400)
RBC: 2.82 MIL/uL — ABNORMAL LOW (ref 4.22–5.81)
RDW: 18.1 % — ABNORMAL HIGH (ref 11.5–15.5)
WBC: 6.4 10*3/uL (ref 4.0–10.5)
nRBC: 0 % (ref 0.0–0.2)

## 2021-02-21 LAB — URINE CULTURE

## 2021-02-23 ENCOUNTER — Inpatient Hospital Stay (HOSPITAL_COMMUNITY)
Admission: EM | Admit: 2021-02-23 | Discharge: 2021-03-04 | DRG: 698 | Disposition: A | Discharge status: Home Health | Payer: Medicare HMO | Attending: Internal Medicine | Admitting: Internal Medicine

## 2021-02-23 ENCOUNTER — Emergency Department (HOSPITAL_COMMUNITY): Payer: Medicare HMO

## 2021-02-23 ENCOUNTER — Other Ambulatory Visit: Payer: Self-pay

## 2021-02-23 ENCOUNTER — Encounter (HOSPITAL_COMMUNITY): Payer: Self-pay | Admitting: Emergency Medicine

## 2021-02-23 DIAGNOSIS — Z87442 Personal history of urinary calculi: Secondary | ICD-10-CM

## 2021-02-23 DIAGNOSIS — Y732 Prosthetic and other implants, materials and accessory gastroenterology and urology devices associated with adverse incidents: Secondary | ICD-10-CM | POA: Diagnosis present

## 2021-02-23 DIAGNOSIS — I4891 Unspecified atrial fibrillation: Secondary | ICD-10-CM | POA: Diagnosis not present

## 2021-02-23 DIAGNOSIS — T8579XD Infection and inflammatory reaction due to other internal prosthetic devices, implants and grafts, subsequent encounter: Secondary | ICD-10-CM | POA: Diagnosis not present

## 2021-02-23 DIAGNOSIS — Z8249 Family history of ischemic heart disease and other diseases of the circulatory system: Secondary | ICD-10-CM

## 2021-02-23 DIAGNOSIS — J9601 Acute respiratory failure with hypoxia: Secondary | ICD-10-CM | POA: Diagnosis not present

## 2021-02-23 DIAGNOSIS — Z87891 Personal history of nicotine dependence: Secondary | ICD-10-CM

## 2021-02-23 DIAGNOSIS — G9340 Encephalopathy, unspecified: Secondary | ICD-10-CM | POA: Diagnosis not present

## 2021-02-23 DIAGNOSIS — A419 Sepsis, unspecified organism: Secondary | ICD-10-CM | POA: Diagnosis present

## 2021-02-23 DIAGNOSIS — Z825 Family history of asthma and other chronic lower respiratory diseases: Secondary | ICD-10-CM

## 2021-02-23 DIAGNOSIS — I255 Ischemic cardiomyopathy: Secondary | ICD-10-CM | POA: Diagnosis present

## 2021-02-23 DIAGNOSIS — M79601 Pain in right arm: Secondary | ICD-10-CM | POA: Diagnosis not present

## 2021-02-23 DIAGNOSIS — I110 Hypertensive heart disease with heart failure: Secondary | ICD-10-CM | POA: Diagnosis present

## 2021-02-23 DIAGNOSIS — Z886 Allergy status to analgesic agent status: Secondary | ICD-10-CM

## 2021-02-23 DIAGNOSIS — I4892 Unspecified atrial flutter: Secondary | ICD-10-CM | POA: Diagnosis not present

## 2021-02-23 DIAGNOSIS — Z951 Presence of aortocoronary bypass graft: Secondary | ICD-10-CM

## 2021-02-23 DIAGNOSIS — T83511A Infection and inflammatory reaction due to indwelling urethral catheter, initial encounter: Principal | ICD-10-CM | POA: Diagnosis present

## 2021-02-23 DIAGNOSIS — A498 Other bacterial infections of unspecified site: Secondary | ICD-10-CM | POA: Diagnosis not present

## 2021-02-23 DIAGNOSIS — E7800 Pure hypercholesterolemia, unspecified: Secondary | ICD-10-CM | POA: Diagnosis present

## 2021-02-23 DIAGNOSIS — Z20822 Contact with and (suspected) exposure to covid-19: Secondary | ICD-10-CM | POA: Diagnosis present

## 2021-02-23 DIAGNOSIS — R319 Hematuria, unspecified: Secondary | ICD-10-CM | POA: Diagnosis not present

## 2021-02-23 DIAGNOSIS — D62 Acute posthemorrhagic anemia: Secondary | ICD-10-CM | POA: Diagnosis present

## 2021-02-23 DIAGNOSIS — Z7984 Long term (current) use of oral hypoglycemic drugs: Secondary | ICD-10-CM

## 2021-02-23 DIAGNOSIS — I10 Essential (primary) hypertension: Secondary | ICD-10-CM | POA: Diagnosis not present

## 2021-02-23 DIAGNOSIS — A499 Bacterial infection, unspecified: Secondary | ICD-10-CM

## 2021-02-23 DIAGNOSIS — N179 Acute kidney failure, unspecified: Secondary | ICD-10-CM | POA: Diagnosis present

## 2021-02-23 DIAGNOSIS — I82611 Acute embolism and thrombosis of superficial veins of right upper extremity: Secondary | ICD-10-CM | POA: Diagnosis not present

## 2021-02-23 DIAGNOSIS — N139 Obstructive and reflux uropathy, unspecified: Secondary | ICD-10-CM | POA: Diagnosis present

## 2021-02-23 DIAGNOSIS — R0609 Other forms of dyspnea: Secondary | ICD-10-CM | POA: Diagnosis not present

## 2021-02-23 DIAGNOSIS — Z885 Allergy status to narcotic agent status: Secondary | ICD-10-CM

## 2021-02-23 DIAGNOSIS — I252 Old myocardial infarction: Secondary | ICD-10-CM

## 2021-02-23 DIAGNOSIS — R6520 Severe sepsis without septic shock: Secondary | ICD-10-CM | POA: Diagnosis present

## 2021-02-23 DIAGNOSIS — K7581 Nonalcoholic steatohepatitis (NASH): Secondary | ICD-10-CM | POA: Diagnosis present

## 2021-02-23 DIAGNOSIS — G928 Other toxic encephalopathy: Secondary | ICD-10-CM | POA: Diagnosis not present

## 2021-02-23 DIAGNOSIS — Z8701 Personal history of pneumonia (recurrent): Secondary | ICD-10-CM

## 2021-02-23 DIAGNOSIS — G4733 Obstructive sleep apnea (adult) (pediatric): Secondary | ICD-10-CM | POA: Diagnosis not present

## 2021-02-23 DIAGNOSIS — I2510 Atherosclerotic heart disease of native coronary artery without angina pectoris: Secondary | ICD-10-CM | POA: Diagnosis present

## 2021-02-23 DIAGNOSIS — I248 Other forms of acute ischemic heart disease: Secondary | ICD-10-CM | POA: Diagnosis not present

## 2021-02-23 DIAGNOSIS — R52 Pain, unspecified: Secondary | ICD-10-CM

## 2021-02-23 DIAGNOSIS — E871 Hypo-osmolality and hyponatremia: Secondary | ICD-10-CM | POA: Diagnosis not present

## 2021-02-23 DIAGNOSIS — K2900 Acute gastritis without bleeding: Secondary | ICD-10-CM | POA: Diagnosis not present

## 2021-02-23 DIAGNOSIS — I2583 Coronary atherosclerosis due to lipid rich plaque: Secondary | ICD-10-CM | POA: Diagnosis not present

## 2021-02-23 DIAGNOSIS — E876 Hypokalemia: Secondary | ICD-10-CM

## 2021-02-23 DIAGNOSIS — Z79899 Other long term (current) drug therapy: Secondary | ICD-10-CM

## 2021-02-23 DIAGNOSIS — I498 Other specified cardiac arrhythmias: Secondary | ICD-10-CM | POA: Diagnosis present

## 2021-02-23 DIAGNOSIS — E1165 Type 2 diabetes mellitus with hyperglycemia: Secondary | ICD-10-CM | POA: Diagnosis not present

## 2021-02-23 DIAGNOSIS — I5033 Acute on chronic diastolic (congestive) heart failure: Secondary | ICD-10-CM | POA: Diagnosis present

## 2021-02-23 DIAGNOSIS — Z1624 Resistance to multiple antibiotics: Secondary | ICD-10-CM | POA: Diagnosis present

## 2021-02-23 DIAGNOSIS — D649 Anemia, unspecified: Secondary | ICD-10-CM | POA: Diagnosis present

## 2021-02-23 DIAGNOSIS — Z8673 Personal history of transient ischemic attack (TIA), and cerebral infarction without residual deficits: Secondary | ICD-10-CM

## 2021-02-23 DIAGNOSIS — N390 Urinary tract infection, site not specified: Secondary | ICD-10-CM | POA: Diagnosis present

## 2021-02-23 DIAGNOSIS — R0600 Dyspnea, unspecified: Secondary | ICD-10-CM | POA: Diagnosis not present

## 2021-02-23 DIAGNOSIS — Z955 Presence of coronary angioplasty implant and graft: Secondary | ICD-10-CM

## 2021-02-23 DIAGNOSIS — Z1612 Extended spectrum beta lactamase (ESBL) resistance: Secondary | ICD-10-CM | POA: Diagnosis present

## 2021-02-23 DIAGNOSIS — K7460 Unspecified cirrhosis of liver: Secondary | ICD-10-CM

## 2021-02-23 DIAGNOSIS — J80 Acute respiratory distress syndrome: Secondary | ICD-10-CM | POA: Diagnosis present

## 2021-02-23 DIAGNOSIS — B952 Enterococcus as the cause of diseases classified elsewhere: Secondary | ICD-10-CM | POA: Diagnosis not present

## 2021-02-23 DIAGNOSIS — D696 Thrombocytopenia, unspecified: Secondary | ICD-10-CM | POA: Diagnosis present

## 2021-02-23 DIAGNOSIS — Z7902 Long term (current) use of antithrombotics/antiplatelets: Secondary | ICD-10-CM

## 2021-02-23 DIAGNOSIS — M47812 Spondylosis without myelopathy or radiculopathy, cervical region: Secondary | ICD-10-CM | POA: Diagnosis present

## 2021-02-23 DIAGNOSIS — T83098A Other mechanical complication of other indwelling urethral catheter, initial encounter: Secondary | ICD-10-CM | POA: Diagnosis not present

## 2021-02-23 DIAGNOSIS — J810 Acute pulmonary edema: Secondary | ICD-10-CM | POA: Diagnosis not present

## 2021-02-23 DIAGNOSIS — K7469 Other cirrhosis of liver: Secondary | ICD-10-CM | POA: Diagnosis not present

## 2021-02-23 DIAGNOSIS — R7881 Bacteremia: Secondary | ICD-10-CM | POA: Diagnosis not present

## 2021-02-23 DIAGNOSIS — Z7901 Long term (current) use of anticoagulants: Secondary | ICD-10-CM

## 2021-02-23 DIAGNOSIS — A4181 Sepsis due to Enterococcus: Secondary | ICD-10-CM | POA: Diagnosis not present

## 2021-02-23 DIAGNOSIS — Z833 Family history of diabetes mellitus: Secondary | ICD-10-CM

## 2021-02-23 DIAGNOSIS — E119 Type 2 diabetes mellitus without complications: Secondary | ICD-10-CM | POA: Diagnosis not present

## 2021-02-23 DIAGNOSIS — Z7982 Long term (current) use of aspirin: Secondary | ICD-10-CM

## 2021-02-23 DIAGNOSIS — I480 Paroxysmal atrial fibrillation: Secondary | ICD-10-CM | POA: Diagnosis not present

## 2021-02-23 DIAGNOSIS — G319 Degenerative disease of nervous system, unspecified: Secondary | ICD-10-CM | POA: Diagnosis not present

## 2021-02-23 LAB — URINALYSIS, ROUTINE W REFLEX MICROSCOPIC
RBC / HPF: >50 RBC/hpf — ABNORMAL HIGH (ref 0–5)
WBC, UA: >50 WBC/hpf — ABNORMAL HIGH (ref 0–5)

## 2021-02-23 LAB — COMPREHENSIVE METABOLIC PANEL
ALT: 27 U/L (ref 0–44)
AST: 113 U/L — ABNORMAL HIGH (ref 15–41)
Albumin: 3.3 g/dL — ABNORMAL LOW (ref 3.5–5.0)
Alkaline Phosphatase: 65 U/L (ref 38–126)
Anion gap: 11 (ref 5–15)
BUN: 35 mg/dL — ABNORMAL HIGH (ref 8–23)
CO2: 24 mmol/L (ref 22–32)
Calcium: 8.1 mg/dL — ABNORMAL LOW (ref 8.9–10.3)
Chloride: 101 mmol/L (ref 98–111)
Creatinine, Ser: 2.11 mg/dL — ABNORMAL HIGH (ref 0.61–1.24)
GFR, Estimated: 33 mL/min — ABNORMAL LOW (ref >60)
Glucose, Bld: 235 mg/dL — ABNORMAL HIGH (ref 70–99)
Potassium: 4.1 mmol/L (ref 3.5–5.1)
Sodium: 136 mmol/L (ref 135–145)
Total Bilirubin: 1.4 mg/dL — ABNORMAL HIGH (ref 0.3–1.2)
Total Protein: 6.1 g/dL — ABNORMAL LOW (ref 6.5–8.1)

## 2021-02-23 LAB — TROPONIN I (HIGH SENSITIVITY)
Troponin I (High Sensitivity): 39 ng/L — ABNORMAL HIGH (ref <18)
Troponin I (High Sensitivity): 38 ng/L — ABNORMAL HIGH (ref <18)

## 2021-02-23 LAB — APTT: aPTT: 31 seconds (ref 24–36)

## 2021-02-23 LAB — CBC WITH DIFFERENTIAL/PLATELET
Abs Immature Granulocytes: 0.03 10*3/uL (ref 0.00–0.07)
Basophils Absolute: 0 10*3/uL (ref 0.0–0.1)
Basophils Relative: 0 %
Eosinophils Absolute: 0 10*3/uL (ref 0.0–0.5)
Eosinophils Relative: 0 %
HCT: 20.1 % — ABNORMAL LOW (ref 39.0–52.0)
Hemoglobin: 6.8 g/dL — CL (ref 13.0–17.0)
Immature Granulocytes: 0 %
Lymphocytes Relative: 1 %
Lymphs Abs: 0.1 10*3/uL — ABNORMAL LOW (ref 0.7–4.0)
MCH: 32.2 pg (ref 26.0–34.0)
MCHC: 33.8 g/dL (ref 30.0–36.0)
MCV: 95.3 fL (ref 80.0–100.0)
Monocytes Absolute: 0.4 10*3/uL (ref 0.1–1.0)
Monocytes Relative: 5 %
Neutro Abs: 8.4 10*3/uL — ABNORMAL HIGH (ref 1.7–7.7)
Neutrophils Relative %: 94 %
Platelets: 125 10*3/uL — ABNORMAL LOW (ref 150–400)
RBC: 2.11 MIL/uL — ABNORMAL LOW (ref 4.22–5.81)
RDW: 17.5 % — ABNORMAL HIGH (ref 11.5–15.5)
WBC: 9 10*3/uL (ref 4.0–10.5)
nRBC: 0 % (ref 0.0–0.2)

## 2021-02-23 LAB — LACTIC ACID, PLASMA
Lactic Acid, Venous: 1.2 mmol/L (ref 0.5–1.9)
Lactic Acid, Venous: 1.4 mmol/L (ref 0.5–1.9)

## 2021-02-23 LAB — RESP PANEL BY RT-PCR (FLU A&B, COVID) ARPGX2
Influenza A by PCR: NEGATIVE
Influenza B by PCR: NEGATIVE
SARS Coronavirus 2 by RT PCR: NEGATIVE

## 2021-02-23 LAB — GLUCOSE, CAPILLARY: Glucose-Capillary: 217 mg/dL — ABNORMAL HIGH (ref 70–99)

## 2021-02-23 LAB — PREPARE RBC (CROSSMATCH)

## 2021-02-23 LAB — PROTIME-INR
INR: 1.2 (ref 0.8–1.2)
Prothrombin Time: 15.5 seconds — ABNORMAL HIGH (ref 11.4–15.2)

## 2021-02-23 MED ORDER — LACTATED RINGERS IV SOLN: INTRAVENOUS | Status: DC

## 2021-02-23 MED ORDER — PROMETHAZINE HCL 25 MG PO TABS
25.0000 mg | ORAL_TABLET | Freq: Four times a day (QID) | ORAL | Status: DC | PRN
  Administered 2021-02-24: 25 mg via ORAL
  Filled 2021-02-23: qty 1

## 2021-02-23 MED ORDER — ROPINIROLE HCL 1 MG PO TABS
1.0000 mg | ORAL_TABLET | Freq: Every day | ORAL | Status: DC
  Administered 2021-02-24 – 2021-03-03 (×9): 1 mg via ORAL
  Filled 2021-02-23 (×10): qty 1

## 2021-02-23 MED ORDER — LACTATED RINGERS IV BOLUS (SEPSIS)
1000.0000 mL | Freq: Once | INTRAVENOUS | Status: AC
  Administered 2021-02-23: 1000 mL via INTRAVENOUS

## 2021-02-23 MED ORDER — DILTIAZEM HCL ER 60 MG PO CP12
120.0000 mg | ORAL_CAPSULE | ORAL | Status: DC
  Filled 2021-02-23: qty 2

## 2021-02-23 MED ORDER — IBUPROFEN 800 MG PO TABS
800.0000 mg | ORAL_TABLET | Freq: Four times a day (QID) | ORAL | Status: DC | PRN
  Administered 2021-02-23 – 2021-02-24 (×3): 800 mg via ORAL
  Filled 2021-02-23 (×3): qty 1

## 2021-02-23 MED ORDER — BACLOFEN 10 MG PO TABS
10.0000 mg | ORAL_TABLET | Freq: Three times a day (TID) | ORAL | Status: DC
  Administered 2021-02-24 – 2021-02-26 (×7): 10 mg via ORAL
  Filled 2021-02-23 (×8): qty 1

## 2021-02-23 MED ORDER — PIOGLITAZONE HCL 15 MG PO TABS
15.0000 mg | ORAL_TABLET | Freq: Every day | ORAL | Status: DC
  Filled 2021-02-23: qty 1

## 2021-02-23 MED ORDER — NIACIN 500 MG PO TABS
500.0000 mg | ORAL_TABLET | Freq: Every day | ORAL | Status: DC
  Administered 2021-02-25 – 2021-03-03 (×7): 500 mg via ORAL
  Filled 2021-02-23 (×8): qty 1

## 2021-02-23 MED ORDER — TRAMADOL HCL 50 MG PO TABS
50.0000 mg | ORAL_TABLET | Freq: Four times a day (QID) | ORAL | Status: DC | PRN
  Administered 2021-02-24: 50 mg via ORAL
  Filled 2021-02-23: qty 1

## 2021-02-23 MED ORDER — INSULIN ASPART 100 UNIT/ML IJ SOLN
0.0000 [IU] | Freq: Three times a day (TID) | INTRAMUSCULAR | Status: DC
  Administered 2021-02-24: 2 [IU] via SUBCUTANEOUS
  Administered 2021-02-24: 3 [IU] via SUBCUTANEOUS
  Administered 2021-02-24: 2 [IU] via SUBCUTANEOUS
  Administered 2021-02-25: 3 [IU] via SUBCUTANEOUS
  Administered 2021-02-25: 5 [IU] via SUBCUTANEOUS
  Administered 2021-02-25: 2 [IU] via SUBCUTANEOUS
  Administered 2021-02-26: 5 [IU] via SUBCUTANEOUS
  Administered 2021-02-26 – 2021-02-27 (×3): 3 [IU] via SUBCUTANEOUS
  Administered 2021-02-27: 8 [IU] via SUBCUTANEOUS
  Administered 2021-02-27 – 2021-02-28 (×2): 5 [IU] via SUBCUTANEOUS
  Administered 2021-02-28: 8 [IU] via SUBCUTANEOUS
  Administered 2021-02-28: 5 [IU] via SUBCUTANEOUS
  Administered 2021-03-01: 3 [IU] via SUBCUTANEOUS
  Administered 2021-03-01 (×2): 5 [IU] via SUBCUTANEOUS
  Administered 2021-03-02: 2 [IU] via SUBCUTANEOUS
  Administered 2021-03-02 (×2): 3 [IU] via SUBCUTANEOUS
  Administered 2021-03-03 (×2): 2 [IU] via SUBCUTANEOUS
  Administered 2021-03-03: 8 [IU] via SUBCUTANEOUS
  Administered 2021-03-04: 3 [IU] via SUBCUTANEOUS

## 2021-02-23 MED ORDER — NITROGLYCERIN 0.4 MG SL SUBL: 0.4000 mg | SUBLINGUAL_TABLET | SUBLINGUAL | Status: DC | PRN

## 2021-02-23 MED ORDER — SODIUM CHLORIDE 0.9 % IV SOLN
2.0000 g | Freq: Once | INTRAVENOUS | Status: AC
  Administered 2021-02-23: 2 g via INTRAVENOUS
  Filled 2021-02-23: qty 2

## 2021-02-23 MED ORDER — ROSUVASTATIN CALCIUM 10 MG PO TABS
5.0000 mg | ORAL_TABLET | Freq: Every evening | ORAL | Status: DC
  Administered 2021-02-25 – 2021-03-03 (×7): 5 mg via ORAL
  Filled 2021-02-23 (×8): qty 1

## 2021-02-23 MED ORDER — BUPROPION HCL ER (XL) 300 MG PO TB24
300.0000 mg | ORAL_TABLET | Freq: Every day | ORAL | Status: DC
  Administered 2021-02-25 – 2021-02-27 (×3): 300 mg via ORAL
  Filled 2021-02-23 (×4): qty 1

## 2021-02-23 MED ORDER — ONDANSETRON HCL 4 MG/2ML IJ SOLN
4.0000 mg | Freq: Four times a day (QID) | INTRAMUSCULAR | Status: DC | PRN
  Administered 2021-02-23 – 2021-02-25 (×6): 4 mg via INTRAVENOUS
  Filled 2021-02-23 (×6): qty 2

## 2021-02-23 MED ORDER — SODIUM CHLORIDE 0.9 % IV SOLN
2.0000 g | Freq: Two times a day (BID) | INTRAVENOUS | Status: DC
  Administered 2021-02-24: 2 g via INTRAVENOUS
  Filled 2021-02-23: qty 2

## 2021-02-23 MED ORDER — SODIUM CHLORIDE 0.9 % IV SOLN
10.0000 mL/h | Freq: Once | INTRAVENOUS | Status: AC
  Administered 2021-02-23: 10 mL/h via INTRAVENOUS

## 2021-02-23 MED ORDER — SODIUM CHLORIDE 0.9% IV SOLUTION: Freq: Once | INTRAVENOUS | Status: DC

## 2021-02-23 MED ORDER — INSULIN ASPART 100 UNIT/ML IJ SOLN
0.0000 [IU] | Freq: Every day | INTRAMUSCULAR | Status: DC
  Administered 2021-02-23 – 2021-02-26 (×2): 2 [IU] via SUBCUTANEOUS

## 2021-02-23 MED ORDER — ACETAMINOPHEN 500 MG PO TABS
1000.0000 mg | ORAL_TABLET | Freq: Once | ORAL | Status: AC
  Administered 2021-02-23: 1000 mg via ORAL
  Filled 2021-02-23: qty 2

## 2021-02-23 MED ORDER — ONDANSETRON HCL 4 MG PO TABS: 4.0000 mg | ORAL_TABLET | Freq: Four times a day (QID) | ORAL | Status: DC | PRN

## 2021-02-23 MED ORDER — SODIUM CHLORIDE 0.9 % IV SOLN: 2.0000 g | Freq: Once | INTRAVENOUS | Status: DC

## 2021-02-23 MED ORDER — IBUPROFEN 200 MG PO TABS: 600.0000 mg | ORAL_TABLET | ORAL | Status: DC | PRN

## 2021-02-23 MED ORDER — TAMSULOSIN HCL 0.4 MG PO CAPS
0.4000 mg | ORAL_CAPSULE | Freq: Every evening | ORAL | Status: DC
  Administered 2021-02-24 – 2021-03-03 (×8): 0.4 mg via ORAL
  Filled 2021-02-23 (×8): qty 1

## 2021-02-23 MED ORDER — GABAPENTIN 300 MG PO CAPS
600.0000 mg | ORAL_CAPSULE | Freq: Three times a day (TID) | ORAL | Status: DC
  Administered 2021-02-24 – 2021-02-26 (×7): 600 mg via ORAL
  Filled 2021-02-23 (×8): qty 2

## 2021-02-23 MED ORDER — ENOXAPARIN SODIUM 40 MG/0.4ML IJ SOSY
40.0000 mg | PREFILLED_SYRINGE | INTRAMUSCULAR | Status: DC
  Administered 2021-02-23: 40 mg via SUBCUTANEOUS
  Filled 2021-02-23: qty 0.4

## 2021-02-23 MED ORDER — PANTOPRAZOLE SODIUM 40 MG PO TBEC
40.0000 mg | DELAYED_RELEASE_TABLET | Freq: Two times a day (BID) | ORAL | Status: DC
  Administered 2021-02-24 – 2021-03-04 (×17): 40 mg via ORAL
  Filled 2021-02-23 (×18): qty 1

## 2021-02-23 MED ORDER — ASPIRIN EC 81 MG PO TBEC: 81.0000 mg | DELAYED_RELEASE_TABLET | Freq: Every evening | ORAL | Status: DC

## 2021-02-23 NOTE — Sepsis Progress Note (Signed)
[facility] Following Code Sepsis

## 2021-02-23 NOTE — ED Provider Notes (Signed)
Kristopher [name]   CSN: [id number] Arrival date & time: 02/23/21  1526     History Chief Complaint  Patient presents with   Weakness   Nausea    Kristopher [name] is a 70 y.o. male.   Weakness Associated symptoms: abdominal pain, fever, myalgias, nausea and vomiting   Associated symptoms: no arthralgias, no chest pain, no cough, no dysuria, no seizures and no shortness of breath   Abdominal pain:    Location:  L flank and LLQ   Quality: stabbing     Severity:  Severe   Timing:  Constant   Progression:  Unchanged  70 year old male presenting to the emergency department with recent nephrolithiasis and subsequent ureteric stent placement 2 weeks ago, urinary retention/obstruction requiring Foley catheter placement this past Saturday, now presenting with concern for infection with fever, chills, myalgias, nausea, vomiting, abdominal pain.  The patient states that he felt lightheaded last night and fell and struck his head.  No loss of consciousness.  He has had persistent nausea and vomiting with left lower quadrant abdominal pain and left flank pain.  He has had no drainage from his incision site along his left flank.  He presents to the emergency department GCS 15, ABC intact, febrile to 100.7, tachycardic to 107, borderline hypotensive BP 93/57, saturating well on room air.  He states that his abdominal pain is sharp, shooting, 2 out of 10 in severity.  He states that he has had nausea that did not resolve with Zofran administration.  Past Medical History:  Diagnosis Date   Anemia 12/2020   Anginal pain ([place])    Atrial fibrillation ([place])    a. s/p PVI X2 by Dr Omelia Blackwater at [facility] around 2000.   CAD (coronary artery disease)    a. NSTEMI (06/2013):  LHC (06/27/2013):  pLAD 10-20, RCA 90, EF 45-50%, inf HK.  PCI:  Xience Alpine (3 x 23 mm) DES to the RCA. b. Relook cath 07/10/13 - patent stent. Imdur added. c. Recurrent CP after that - abnormal nuc  07/2017 with chest pain, dyspnea, hypotensive response. Rest images with mid-basal inferior thinning. No stress images due to abrupt d/c of test. Relook cath 3/12 - no significant r   Chronic back pain greater than 3 months duration    Cirrhosis, non-alcoholic (HCC)    Depression    DJD (degenerative joint disease) of cervical spine    "3 herniated discs" (09/01/2013)   DJD (degenerative joint disease), lumbosacral    "bulging L5-S1" (09/01/2013)   Dysrhythmia    PVCs   Herniated cervical disc    c2-T1 fusion   HLD (hyperlipidemia)    Ischemic cardiomyopathy    a. Echo (06/28/2011): Inferior and inferoseptal akinesis, EF 64%, grade 2 diastolic dysfunction, MAC, small effusion (no tamponade). B. EF 40% by echo 06/2013, cath 07/2013, 55% by cath 08/2013 with subtle mild mid inferior hypocontractility.   Liver cirrhosis secondary to NASH [facility])    NSTEMI (non-ST elevated myocardial infarction) ([place]) 06/27/2013   Osteoarthritis    "neck and lower back" (09/01/2013)   Pericardial effusion    a. Chronic since hx of pericarditis in 2004. b. Mod by echo 06/2013, small by echo 06/2013.   Pneumonia ~ 2005   Type II diabetes mellitus [facility])     Patient Active Problem List   Diagnosis Date Noted   Sepsis [facility]) 02/23/2021   Nephrolithiasis 02/11/2021   Syncope 09/18/2019   Encounter for general adult medical examination with abnormal  findings 04/30/2018   Muscle spasm 04/30/2018   Dysuria 04/30/2018   Cirrhosis of liver without ascites ([place]) 12/17/2015   Pericardial effusion 07/22/2014   Restless legs syndrome 06/26/2014   Other amnesia 06/04/2014   Coronary artery disease involving native coronary artery of native heart without angina pectoris 04/13/2014   Cardiomyopathy, ischemic 04/13/2014   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   Obstructive sleep apnea syndrome 03/12/2014   Acute pericarditis, unspecified - possible 09/04/2013   Exertional chest pain 09/04/2013   Chest  pain with moderate risk for cardiac etiology - etiology likely pericarditis 09/01/2013   Abnormal exercise tolerance test 08/26/2013   Depression 08/07/2013   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   Diabetes mellitus, type 2 07/17/2013   Type 2 diabetes mellitus without complications ([place]) 16/03/9603   Unstable angina ([place]) 07/09/2013   Atrial fibrillation s/p PVI ablation at [facility] 2001    Non-ST elevation myocardial infarction (NSTEMI), subendocardial infarction, subsequent episode of care ([place]) 06/27/2013    Class: History of    Past Surgical History:  Procedure Laterality Date   APPENDECTOMY  ~ 1965   ATRIAL FIBRILLATION ABLATION  2000   Dr Omelia Blackwater at [place] N/A 10/07/2013   Procedure: LEFT AND RIGHT HEART CATHETERIZATION WITH CORONARY ANGIOGRAM;  Surgeon: Troy Sine, MD;  Location: [facility] CATH LAB;  Service: Cardiovascular;  Laterality: N/A;   LEFT HEART CATHETERIZATION WITH CORONARY ANGIOGRAM N/A 06/27/2013   Procedure: LEFT HEART CATHETERIZATION WITH CORONARY ANGIOGRAM;  Surgeon: Troy Sine, MD;  Location: [facility] CATH LAB;  Service: Cardiovascular;  Laterality: N/A;   LEFT HEART CATHETERIZATION WITH CORONARY ANGIOGRAM N/A 07/10/2013   Procedure: LEFT HEART CATHETERIZATION WITH CORONARY ANGIOGRAM;  Surgeon: Burnell Blanks, MD;  Location: [facility] CATH LAB;  Service: Cardiovascular;  Laterality: N/A;   LEFT HEART CATHETERIZATION WITH CORONARY ANGIOGRAM N/A 08/28/2013   Procedure: LEFT HEART CATHETERIZATION WITH CORONARY ANGIOGRAM;  Surgeon: Troy Sine, MD;  Location: [facility] CATH LAB;  Service: Cardiovascular;  Laterality: N/A;   NEPHROLITHOTOMY Left 02/11/2021   Procedure: LEFT NEPHROLITHOTOMY PERCUTANEOUS WITH SURGEON ACCESS;  Surgeon: [name], MD;  Location: [facility] ORS;  Service: Urology;  Laterality: Left;   PERCUTANEOUS CORONARY STENT INTERVENTION (PCI-S)  06/27/2013   Procedure: PERCUTANEOUS CORONARY STENT INTERVENTION  (PCI-S);  Surgeon: Troy Sine, MD;  Location: [facility] CATH LAB;  Service: Cardiovascular;;   POSTERIOR CERVICAL FUSION/FORAMINOTOMY  2009   fusion c3-t1   PULMONARY VEIN ABLATION  2001   Dr Omelia Blackwater at [place]       Family History  Problem Relation Age of Onset   COPD Other    Cancer Other    Diabetes Other    Heart disease Other    CAD Neg Hx        No hx early CAD    Social History   Tobacco Use   Smoking status: Former    Packs/day: 1.50    Years: 43.00    Pack years: 64.50    Types: Cigarettes    Quit date: 05/06/2007    Years since quitting: 13.8   Smokeless tobacco: Never  Vaping Use   Vaping Use: Never used  Substance Use Topics   Alcohol use: Yes    Comment: once monthly/rarely   Drug use: No    Home Medications Prior to Admission medications   Medication Sig Start Date End Date Taking? Authorizing Provider  aspirin EC 81 MG tablet  Take 81 mg by mouth every evening.    Yes [provider]  baclofen (LIORESAL) 10 MG tablet Take 1 tablet (10 mg total) by mouth 3 (three) times daily. 10/16/18  Yes Boscia, Greer Ee, NP  buPROPion (WELLBUTRIN XL) 300 MG 24 hr tablet Take 300 mg by mouth daily. 08/08/19  Yes [provider]  ciprofloxacin (CIPRO) 500 MG tablet Take 500 mg by mouth 2 (two) times daily. 02/22/21  Yes [provider]  clopidogrel (PLAVIX) 75 MG tablet Take 75 mg by mouth daily.   Yes [provider]  diltiazem (CARDIZEM SR) 120 MG 12 hr capsule Take 120 mg by mouth See admin instructions. Take 120 mg by mouth every 36 hours 01/21/21   [provider]  gabapentin (NEURONTIN) 600 MG tablet Take 600 mg by mouth 2 (two) times daily. 02/17/21   [provider]  ibuprofen (ADVIL) 200 MG tablet Take 800 mg by mouth every 6 (six) hours as needed for moderate pain.    [provider]  metFORMIN (GLUCOPHAGE-XR) 500 MG 24 hr tablet Take 1,000 mg by mouth 2 (two) times daily. 07/25/19    [provider]  niacin 500 MG tablet Take 500 mg by mouth daily.    [provider]  nitroGLYCERIN (NITROSTAT) 0.4 MG SL tablet Place 0.4 mg under the tongue every 5 (five) minutes as needed for chest pain.    [provider]  pantoprazole (PROTONIX) 40 MG tablet Take 40 mg by mouth 2 (two) times daily. 08/08/19   [provider]  pioglitazone (ACTOS) 15 MG tablet Take 15 mg by mouth daily. 07/14/19   [provider]  promethazine (PHENERGAN) 25 MG tablet Take 25 mg by mouth every 6 (six) hours as needed for nausea or vomiting. 07/14/19   [provider]  rOPINIRole (REQUIP) 1 MG tablet Take 1 mg by mouth at bedtime. 02/07/21   [provider]  rosuvastatin (CRESTOR) 5 MG tablet Take 5 mg by mouth every evening. 10/07/15   [provider]  tamsulosin (FLOMAX) 0.4 MG CAPS capsule Take 0.4 mg by mouth every evening. 02/17/21   [provider]  traMADol (ULTRAM) 50 MG tablet Take 1-2 tablets (50-100 mg total) by mouth every 6 (six) hours as needed for moderate pain. 02/11/21   [name], MD    Allergies    Codeine, Fentanyl, Naproxen, and Other  Review of Systems   Review of Systems  Constitutional:  Positive for chills and fever.  HENT:  Negative for ear pain and sore throat.   Eyes:  Negative for pain and visual disturbance.  Respiratory:  Negative for cough and shortness of breath.   Cardiovascular:  Negative for chest pain and palpitations.  Gastrointestinal:  Positive for abdominal pain, nausea and vomiting.  Genitourinary:  Positive for flank pain and hematuria. Negative for dysuria.  Musculoskeletal:  Positive for myalgias. Negative for arthralgias and back pain.  Skin:  Negative for color change and rash.  Neurological:  Positive for weakness. Negative for seizures and syncope.  All other systems reviewed and are negative.  Physical Exam Updated Vital Signs BP 94/64   Pulse 100   Temp 98.2 F  (36.8 C) (Oral)   Resp (!) 21   Ht _0  (1.702 m)   Wt 72.5 kg   SpO2 99%   BMI 25.03 kg/m   Physical Exam Vitals and nursing [name] reviewed.  Constitutional:      Appearance: He is well-developed. He is  ill-appearing. He is not diaphoretic.  HENT:     Head: Normocephalic and atraumatic.  Eyes:     Conjunctiva/sclera: Conjunctivae normal.  Cardiovascular:     Rate and Rhythm: Regular rhythm. Tachycardia present.     Pulses: Normal pulses.     Heart sounds: No murmur heard. Pulmonary:     Effort: Pulmonary effort is normal. No respiratory distress.     Breath sounds: Normal breath sounds.  Abdominal:     Palpations: Abdomen is soft.     Tenderness: There is abdominal tenderness. There is left CVA tenderness and guarding. There is no rebound.  Musculoskeletal:        General: No swelling or deformity.     Cervical back: Neck supple.  Skin:    General: Skin is warm and dry.     Coloration: Skin is jaundiced.  Neurological:     General: No focal deficit present.     Mental Status: He is alert and oriented to person, place, and time.    ED Results / Procedures / Treatments   Labs (all labs ordered are listed, but only abnormal results are displayed) Labs Reviewed  COMPREHENSIVE METABOLIC PANEL - Abnormal; Notable for the following components:      Result Value   Glucose, Bld 235 (*)    BUN 35 (*)    Creatinine, Ser 2.11 (*)    Calcium 8.1 (*)    Total Protein 6.1 (*)    Albumin 3.3 (*)    AST 113 (*)    Total Bilirubin 1.4 (*)    GFR, Estimated 33 (*)    All other components within normal limits  CBC WITH DIFFERENTIAL/PLATELET - Abnormal; Notable for the following components:   RBC 2.11 (*)    Hemoglobin 6.8 (*)    HCT 20.1 (*)    RDW 17.5 (*)    Platelets 125 (*)    Neutro Abs 8.4 (*)    Lymphs Abs 0.1 (*)    All other components within normal limits  PROTIME-INR - Abnormal; Notable for the following components:   Prothrombin Time 15.5 (*)    All other  components within normal limits  URINALYSIS, ROUTINE W REFLEX MICROSCOPIC - Abnormal; Notable for the following components:   Color, Urine BROWN (*)    APPearance TURBID (*)    Glucose, UA   (*)    Value: TEST NOT REPORTED DUE TO COLOR INTERFERENCE OF URINE PIGMENT   Hgb urine dipstick   (*)    Value: TEST NOT REPORTED DUE TO COLOR INTERFERENCE OF URINE PIGMENT   Bilirubin Urine   (*)    Value: TEST NOT REPORTED DUE TO COLOR INTERFERENCE OF URINE PIGMENT   Ketones, ur   (*)    Value: TEST NOT REPORTED DUE TO COLOR INTERFERENCE OF URINE PIGMENT   Protein, ur   (*)    Value: TEST NOT REPORTED DUE TO COLOR INTERFERENCE OF URINE PIGMENT   Nitrite   (*)    Value: TEST NOT REPORTED DUE TO COLOR INTERFERENCE OF URINE PIGMENT   Leukocytes,Ua   (*)    Value: TEST NOT REPORTED DUE TO COLOR INTERFERENCE OF URINE PIGMENT   RBC / HPF >50 (*)    WBC, UA >50 (*)    Bacteria, UA MANY (*)    All other components within normal limits  TROPONIN I (HIGH SENSITIVITY) - Abnormal; Notable for the following components:   Troponin I (High Sensitivity) 39 (*)    All other components within  normal limits  RESP PANEL BY RT-PCR (FLU A&B, COVID) ARPGX2  CULTURE, BLOOD (ROUTINE X 2)  CULTURE, BLOOD (ROUTINE X 2)  URINE CULTURE  LACTIC ACID, PLASMA  APTT  LACTIC ACID, PLASMA  PREPARE RBC (CROSSMATCH)  TYPE AND SCREEN  TROPONIN I (HIGH SENSITIVITY)    EKG EKG Interpretation  Date/Time:  Wednesday February 23 2021 16:46:44 EDT Ventricular Rate:  107 PR Interval:  150 QRS Duration: 94 QT Interval:  329 QTC Calculation: 439 R Axis:   60 Text Interpretation: Sinus tachycardia Nonspecific T abnormalities, diffuse leads No STEMI Confirmed by Regan Lemming ([id number]) on 02/23/2021 5:03:41 PM  Radiology CT ABDOMEN PELVIS WO CONTRAST  Result Date: 02/23/2021 CLINICAL DATA:  Abdominal pain, fever. EXAM: CT ABDOMEN AND PELVIS WITHOUT CONTRAST TECHNIQUE: Multidetector CT imaging of the abdomen and pelvis was  performed following the standard protocol without IV contrast. COMPARISON:  02/20/2021 FINDINGS: Lower chest: Stable moderate pericardial effusion. Low-density blood pool suggesting anemia. Coronary and descending thoracic aortic atherosclerotic calcification. Dependent subsegmental atelectasis in both lower lobes. Hepatobiliary: Unremarkable Pancreas: Unremarkable Spleen: Unremarkable Adrenals/Urinary Tract: Both adrenal glands appear normal. Foley catheter noted in the urinary bladder. There is a left double-J ureteral stent with proximal and distal loops formed in the renal pelvis and urinary bladder, respectively. Continued accentuated density in the left renal collecting system, density up to about 59 Hounsfield units, formerly 61 Hounsfield units. Decreased to scree of distention of the left renal collecting system compared to previous. No substantial hydroureter. Left perirenal stranding with some mild high density components as on the prior exam, although nodularity along the posterior perirenal fascia is reduced. These likely represent blood products. Stomach/Bowel: There are few mildly dilated loops of small bowel in the left upper quadrant, without an obvious transition. Formed stool in the colon. Mild sigmoid colon diverticulosis. Vascular/Lymphatic: Atherosclerosis is present, including aortoiliac atherosclerotic disease. No pathologic adenopathy identified. Reproductive: Unremarkable Other: No supplemental non-categorized findings. Musculoskeletal: Mild bilateral degenerative hip arthropathy. Lower lumbar spondylosis. Rim calcified synovial cyst posterior to the left L5-S1 facet joint, not in a position to cause impingement. Transitional S1 vertebra. IMPRESSION: 1. Reduced dilation of the left renal collecting system although with some persistent high density material in the collecting system especially in the lower pole likely from blood products. 2. Reduced high-density nodularity along the perirenal  space posteriorly, likely from clot retraction/early resorption. 3. Left double-J ureteral stent remains in place. No appreciable hydroureter despite the mild prominence of the lower pole collecting system. 4. Stable moderate pericardial effusion. 5. Low-density blood pool suggests anemia. 6.  Aortic Atherosclerosis (ICD10-I70.0).  Coronary atherosclerosis. 7. Several mildly dilated loops of small bowel in the left upper quadrant, query ileus or mild enteritis. No obvious transition to indicate obstruction. Electronically Signed   By: Van Clines M.D.   On: 02/23/2021 18:07   CT HEAD WO CONTRAST (5MM)  Result Date: 02/23/2021 CLINICAL DATA:  Head trauma, minor EXAM: CT HEAD WITHOUT CONTRAST TECHNIQUE: Contiguous axial images were obtained from the base of the skull through the vertex without intravenous contrast. COMPARISON:  2015 FINDINGS: Brain: There is no acute intracranial hemorrhage, mass effect, or edema. Gray-white differentiation is preserved. There is no extra-axial fluid collection. Ventricles and sulci are within normal limits in size and configuration. Patchy low-attenuation in the supratentorial white matter is nonspecific but may reflect mild chronic microvascular ischemic changes. Vascular: There is atherosclerotic calcification at the skull base. Skull: Calvarium is unremarkable. Sinuses/Orbits: No acute finding. Other: None. IMPRESSION: No acute  intracranial abnormality. Electronically Signed   By: Macy Mis M.D.   On: 02/23/2021 18:00   DG Chest Port 1 View  Result Date: 02/23/2021 CLINICAL DATA:  Weakness decreased mobility EXAM: PORTABLE CHEST 1 VIEW COMPARISON:  09/18/2019 FINDINGS: Electronic recording device over left chest. No focal opacity or pleural effusion. Normal cardiomediastinal silhouette. No pneumothorax. IMPRESSION: No active disease. Electronically Signed   By: Donavan Foil M.D.   On: 02/23/2021 17:08    Procedures .Critical Care  Date/Time: 02/23/2021 5:02  PM Performed by: Regan Lemming, MD Authorized by: Regan Lemming, MD   Critical care provider statement:    Critical care time (minutes):  44   Critical care was necessary to treat or prevent imminent or life-threatening deterioration of the following conditions:  Sepsis and circulatory failure   Critical care was time spent personally by me on the following activities:  Discussions with consultants, evaluation of patient's response to treatment, examination of patient, ordering and performing treatments and interventions, ordering and review of laboratory studies, ordering and review of radiographic studies, pulse oximetry, re-evaluation of patient's condition, obtaining history from patient or surrogate and review of old charts   Care discussed with: admitting provider     Medications Ordered in ED Medications  lactated ringers infusion ( Intravenous New Bag/Given 02/23/21 1635)  lactated ringers bolus 1,000 mL (1,000 mLs Intravenous New Bag/Given 02/23/21 1634)    And  lactated ringers bolus 1,000 mL (1,000 mLs Intravenous New Bag/Given 02/23/21 1635)  ceFEPIme (MAXIPIME) 2 g in sodium chloride 0.9 % 100 mL IVPB (0 g Intravenous Stopped 02/23/21 1706)  acetaminophen (TYLENOL) tablet 1,000 mg (1,000 mg Oral Given 02/23/21 1641)  0.9 %  sodium chloride infusion (10 mL/hr Intravenous New Bag/Given 02/23/21 1859)    ED Course  I have reviewed the triage vital signs and the nursing notes.  Pertinent labs & imaging results that were available during my care of the patient were reviewed by me and considered in my medical decision making (see chart for details).    MDM Rules/Calculators/A&P                           70 year old male presenting to the emergency department with recent nephrolithiasis and subsequent ureteric stent placement 2 weeks ago, urinary retention/obstruction requiring Foley catheter placement this past Saturday, now presenting with concern for infection with fever, chills,  myalgias, nausea, vomiting, abdominal pain.  The patient states that he felt lightheaded last night and fell and struck his head.  No loss of consciousness.  He has had persistent nausea and vomiting with left lower quadrant abdominal pain and left flank pain.  He has had no drainage from his incision site along his left flank.  He presents to the emergency department GCS 15, ABC intact, febrile to 100.7, tachycardic to 107, borderline hypotensive BP 93/57, saturating well on room air.  He states that his abdominal pain is sharp, shooting, 2 out of 10 in severity.  He states that he has had nausea that did not resolve with Zofran administration.  Kristopher [name] presents with sepsis as per above.  His exam is most notable for LLQ and L flank TTP.  My immediate concern is for a life-threatening infection in this patient. Evaluation and treatment for sepsis began at 1630.  The most likely infectious source is urinary.  The patient denies IVDU. I have a low suspicion for meningitis, osteomyelitis, endocarditis, proctitis. I am most concerned for  pyelonephritis and infected ureteral stent. Thorough skin exam revealed a post operative wound with no erythema or drainage.  Labs: CBC notable for a hemoglobin of 6.8 down from 9.0 4x days ago.  The patient has a significant amount of blood in his Foley.  He was consented for PRBC transfusion and administered 1 unit.  Additional labs concerning for a CMP with a new AKI with a creatinine of 2.11 from a baseline around 0.84, increased from 1.214 days ago, slightly elevated AST, elevated T bili to 1.4, lactic acid normal at 1.2, initial troponin mildly elevated to 39, favored to be demand in the setting of decreased oxygen carrying capacity with the patient's acute anemia and poor coronary perfusion the patient's acute illness with concern for sepsis with hypotension and tachycardia.  Imaging:  Chest x-ray without acute cardiac or pulmonary abnormality.  CT of the head  without acute intracranial abnormality.  CT of the abdomen pelvis with reduced dilation of the left renal collecting system with some persistent high density material in the collecting system especially in the lower pole likely from blood products, left double-J ureteric stent remains in place with no appreciable hydroureter, stable moderate pericardial effusion, low-density blood pool suggesting anemia, several mildly dilated loops of small bowel in the left upper quadrant, query ileus or mild enteritis with no obvious obstruction.  The ECG revealed:  A sinus rhythm with a ventricular rate of 107. QTc 439, PR 150, QRS 94 No STEMI No ST depressions Diffuse T wave flattening present.   There is no evidence of: High-Grade Conduction Blocks WPW Long QT Syndrome Significant LVH Brugada Sign Arrhythmogenic Right Ventricular Dysplasia Wellens Waves DeWinters T Waves Right Heart Strain   For treatment, the patient was given 2L LR for IVF and Cefepime for antimicrobials.  TIME 1715. Upon reassessment, the patient appears clinically improved. The patient is not currently hypotensive and has a MAP of 67. The patient's volume status appears to be somewhat improved after fluid resuscitation, which is still ongoing.  [SEVERE SEPSIS] HYPOTENSION (Nursing BPA for hypotension = Is this sepsis?) CREATININE > 2.0, or urine output < 0.5 mL/kg/hour for 2 hours  BILIRUBIN > 2 mg/dL (34.2 mmol/L)  PLATELET COUNT < 100,000  INR > 1.5 or aPTT > 60 sec  LACTATE > 2 mmol/L   [SEPTIC SHOCK] HYPOTENSION REFRACTORY TO 30 cc/kg OF FLUIDS LACTATE > 4 mmol/L  While in the ED, the patient was given: Medications  lactated ringers infusion ( Intravenous New Bag/Given 02/23/21 1635)  lactated ringers bolus 1,000 mL (1,000 mLs Intravenous New Bag/Given 02/23/21 1634)    And  lactated ringers bolus 1,000 mL (1,000 mLs Intravenous New Bag/Given 02/23/21 1635)  ceFEPIme (MAXIPIME) 2 g in sodium chloride 0.9 % 100 mL  IVPB (0 g Intravenous Stopped 02/23/21 1706)  acetaminophen (TYLENOL) tablet 1,000 mg (1,000 mg Oral Given 02/23/21 1641)  0.9 %  sodium chloride infusion (10 mL/hr Intravenous New Bag/Given 02/23/21 1859)   The patient remained hemodynamically stable following fluid resuscitation.  He was administered 2 g of cefepime and Tylenol for fever.  Urology was consulted due to concern for sepsis in the setting of recent urologic instrumentation.  The patient was administered 1 unit PRBCs due to likely acute blood loss anemia postoperatively.  Hospitalist medicine was consulted for admission to the floor.  Kristopher [name] was admitted to the hospital for ongoing treatment and monitoring.  Final Clinical Impression(s) / ED Diagnoses Final diagnoses:  Sepsis with acute renal failure without septic  shock, due to unspecified organism, unspecified acute renal failure type ([place])  Anemia, unspecified type  AKI (acute kidney injury) [facility])    Rx / DC Orders ED Discharge Orders     None        Regan Lemming, MD 02/23/21 1907

## 2021-02-23 NOTE — H&P (Signed)
History and Physical   Kristopher [name]:[id number] DOB: [birth year] DOA: 02/23/2021  Referring MD/NP/PA: Regan Lemming, MD  PCP: Townsend Roger, MD   Outpatient Specialists: Dr. Louis Meckel, urology  Patient coming from: Home  Chief Complaint: Fever and chills with urinary symptoms  HPI: Kristopher [name] is a 70 y.o. male with medical history significant of atrial fibrillation, coronary artery disease, nonalcoholic cirrhosis, depression, hyperlipidemia, urinary retention, diabetes, who had urology procedure on August 25 for a large kidney stone.  Patient has been passing blood since the procedure and was seen in the ER with urinary retention on September 3, now back in the ER with fever chills and Urologic symptoms.  Patient meets sepsis criteria most likely secondary to UTI.  Patient also has urinalysis consistent with ongoing UTI.  He denied any diarrhea or.  No significant vomiting at this point.  At this point patient is being admitted with sepsis secondary to UTI.  ED Course: Temperature 103, blood pressure 87/59, pulse 155, respiratory rate of 28 and oxygen sats 72% on room air.  Sodium 130 potassium 4.11 or CO2 24 glucose 235, BUN 35 creatinine 2.11 calcium 8.1 with a gap of 11 AST is 113 albumin 3.3.  Total bili 1.4.  Lactic acid is 1.4.  White count 9.0 hemoglobin 6.8 and platelet count of 125.INR is 1.2.  Review of Systems: As per HPI otherwise 10 point review of systems negative.    Past Medical History:  Diagnosis Date   Anemia 12/2020   Anginal pain ([place])    Atrial fibrillation ([place])    a. s/p PVI X2 by Dr Omelia Blackwater at [facility] around 2000.   CAD (coronary artery disease)    a. NSTEMI (06/2013):  LHC (06/27/2013):  pLAD 10-20, RCA 90, EF 45-50%, inf HK.  PCI:  Xience Alpine (3 x 23 mm) DES to the RCA. b. Relook cath 07/10/13 - patent stent. Imdur added. c. Recurrent CP after that - abnormal nuc 07/2017 with chest pain, dyspnea, hypotensive response. Rest images with mid-basal inferior thinning. No  stress images due to abrupt d/c of test. Relook cath 3/12 - no significant r   Chronic back pain greater than 3 months duration    Cirrhosis, non-alcoholic (HCC)    Depression    DJD (degenerative joint disease) of cervical spine    "3 herniated discs" (09/01/2013)   DJD (degenerative joint disease), lumbosacral    "bulging L5-S1" (09/01/2013)   Dysrhythmia    PVCs   Herniated cervical disc    c2-T1 fusion   HLD (hyperlipidemia)    Ischemic cardiomyopathy    a. Echo (06/28/2011): Inferior and inferoseptal akinesis, EF 60%, grade 2 diastolic dysfunction, MAC, small effusion (no tamponade). B. EF 40% by echo 06/2013, cath 07/2013, 55% by cath 08/2013 with subtle mild mid inferior hypocontractility.   Liver cirrhosis secondary to NASH [facility])    NSTEMI (non-ST elevated myocardial infarction) ([place]) 06/27/2013   Osteoarthritis    "neck and lower back" (09/01/2013)   Pericardial effusion    a. Chronic since hx of pericarditis in 2004. b. Mod by echo 06/2013, small by echo 06/2013.   Pneumonia ~ 2005   Type II diabetes mellitus ([place])     Past Surgical History:  Procedure Laterality Date   APPENDECTOMY  ~ 1965   ATRIAL FIBRILLATION ABLATION  2000   Dr Omelia Blackwater at [place] N/A 10/07/2013   Procedure: LEFT AND RIGHT HEART CATHETERIZATION WITH CORONARY ANGIOGRAM;  Surgeon: Troy Sine, MD;  Location: [facility] CATH LAB;  Service: Cardiovascular;  Laterality: N/A;   LEFT HEART CATHETERIZATION WITH CORONARY ANGIOGRAM N/A 06/27/2013   Procedure: LEFT HEART CATHETERIZATION WITH CORONARY ANGIOGRAM;  Surgeon: Troy Sine, MD;  Location: [facility] CATH LAB;  Service: Cardiovascular;  Laterality: N/A;   LEFT HEART CATHETERIZATION WITH CORONARY ANGIOGRAM N/A 07/10/2013   Procedure: LEFT HEART CATHETERIZATION WITH CORONARY ANGIOGRAM;  Surgeon: Burnell Blanks, MD;  Location: [facility] CATH LAB;  Service: Cardiovascular;  Laterality: N/A;   LEFT HEART CATHETERIZATION  WITH CORONARY ANGIOGRAM N/A 08/28/2013   Procedure: LEFT HEART CATHETERIZATION WITH CORONARY ANGIOGRAM;  Surgeon: Troy Sine, MD;  Location: [facility] CATH LAB;  Service: Cardiovascular;  Laterality: N/A;   NEPHROLITHOTOMY Left 02/11/2021   Procedure: LEFT NEPHROLITHOTOMY PERCUTANEOUS WITH SURGEON ACCESS;  Surgeon: [name], MD;  Location: [facility] ORS;  Service: Urology;  Laterality: Left;   PERCUTANEOUS CORONARY STENT INTERVENTION (PCI-S)  06/27/2013   Procedure: PERCUTANEOUS CORONARY STENT INTERVENTION (PCI-S);  Surgeon: Troy Sine, MD;  Location: [facility] CATH LAB;  Service: Cardiovascular;;   POSTERIOR CERVICAL FUSION/FORAMINOTOMY  2009   fusion c3-t1   PULMONARY VEIN ABLATION  2001   Dr Omelia Blackwater at [facility]     reports that he quit smoking about 13 years ago. His smoking use included cigarettes. He has a 64.50 pack-year smoking history. He has never used smokeless tobacco. He reports current alcohol use. He reports that he does not use drugs.  Allergies  Allergen Reactions   Codeine Nausea And Vomiting   Fentanyl Nausea And Vomiting   Naproxen Nausea Only   Other Nausea And Vomiting    1. N/V to all opiates. He is able to take versed. (noted 09/29/13) 2.The patient states that he has had  a reaction to all narcotics. He can take them though. (noted 06/26/13)    Family History  Problem Relation Age of Onset   COPD Other    Cancer Other    Diabetes Other    Heart disease Other    CAD Neg Hx        No hx early CAD     Prior to Admission medications   Medication Sig Start Date End Date Taking? Authorizing Provider  aspirin EC 81 MG tablet Take 81 mg by mouth every evening.    Yes [provider]  baclofen (LIORESAL) 10 MG tablet Take 1 tablet (10 mg total) by mouth 3 (three) times daily. 10/16/18  Yes Boscia, Greer Ee, NP  buPROPion (WELLBUTRIN XL) 300 MG 24 hr tablet Take 300 mg by mouth daily. 08/08/19  Yes [provider]  ciprofloxacin (CIPRO)  500 MG tablet Take 500 mg by mouth 2 (two) times daily. 02/22/21  Yes [provider]  clopidogrel (PLAVIX) 75 MG tablet Take 75 mg by mouth daily.   Yes [provider]  diltiazem (CARDIZEM SR) 120 MG 12 hr capsule Take 120 mg by mouth See admin instructions. Take 120 mg by mouth every 36 hours 01/21/21  Yes [provider]  gabapentin (NEURONTIN) 600 MG tablet Take 600 mg by mouth 3 (three) times daily. 02/17/21  Yes [provider]  ibuprofen (ADVIL) 200 MG tablet Take 800 mg by mouth every 6 (six) hours as needed for moderate pain.   Yes [provider]  metFORMIN (GLUCOPHAGE-XR) 500 MG 24 hr tablet Take 1,000 mg by mouth 2 (two) times daily. 07/25/19  Yes [provider]  niacin 500  MG tablet Take 500 mg by mouth daily.   Yes [provider]  nitroGLYCERIN (NITROSTAT) 0.4 MG SL tablet Place 0.4 mg under the tongue every 5 (five) minutes as needed for chest pain.   Yes [provider]  pantoprazole (PROTONIX) 40 MG tablet Take 40 mg by mouth 2 (two) times daily. 08/08/19  Yes [provider]  pioglitazone (ACTOS) 15 MG tablet Take 15 mg by mouth daily. 07/14/19  Yes [provider]  promethazine (PHENERGAN) 25 MG tablet Take 25 mg by mouth every 6 (six) hours as needed for nausea or vomiting. 07/14/19  Yes [provider]  rOPINIRole (REQUIP) 1 MG tablet Take 1 mg by mouth at bedtime. 02/07/21  Yes [provider]  rosuvastatin (CRESTOR) 5 MG tablet Take 5 mg by mouth every evening. 10/07/15  Yes [provider]  tamsulosin (FLOMAX) 0.4 MG CAPS capsule Take 0.4 mg by mouth every evening. 02/17/21  Yes [provider]  traMADol (ULTRAM) 50 MG tablet Take 1-2 tablets (50-100 mg total) by mouth every 6 (six) hours as needed for moderate pain. Patient taking differently: Take 50 mg by mouth every 6 (six) hours as needed for moderate pain. 02/11/21  Yes [name], MD    Physical  Exam: Vitals:   02/23/21 2320 02/23/21 2324 02/23/21 2329 02/23/21 2334  BP: (!) 113/52 95/63 110/63 112/62  Pulse:      Resp: (!) 26 (!) 26 (!) 28 (!) 25  Temp:      TempSrc:      SpO2: 92% 92% 92% 94%  Weight:      Height:          Constitutional: Acutely ill-looking, having chills. Vitals:   02/23/21 2320 02/23/21 2324 02/23/21 2329 02/23/21 2334  BP: (!) 113/52 95/63 110/63 112/62  Pulse:      Resp: (!) 26 (!) 26 (!) 28 (!) 25  Temp:      TempSrc:      SpO2: 92% 92% 92% 94%  Weight:      Height:       Eyes: PERRL, lids and conjunctivae normal ENMT: Mucous membranes are Dry. Posterior pharynx clear of any exudate or lesions.Normal dentition.  Neck: normal, supple, no masses, no thyromegaly Respiratory: clear to auscultation bilaterally, no wheezing, no crackles. Normal respiratory effort. No accessory muscle use.  Cardiovascular: Sinus Tachycardia, no murmurs / rubs / gallops. No extremity edema. 2+ pedal pulses. No carotid bruits.  Abdomen: no tenderness, no masses palpated. No hepatosplenomegaly. Bowel sounds positive.  Musculoskeletal: no clubbing / cyanosis. No joint deformity upper and lower extremities. Good ROM, no contractures. Normal muscle tone.  Skin: no rashes, lesions, ulcers. No induration Neurologic: CN 2-12 grossly intact. Sensation intact, DTR normal. Strength 5/5 in all 4.  Psychiatric: Normal judgment and insight. Alert and oriented x 3. Normal mood.     Labs on Admission: I have personally reviewed following labs and imaging studies  CBC: Recent Labs  Lab 02/19/21 2324 02/23/21 1605  WBC 6.4 9.0  NEUTROABS  --  8.4*  HGB 9.0* 6.8*  HCT 26.4* 20.1*  MCV 93.6 95.3  PLT 181 376*   Basic Metabolic Panel: Recent Labs  Lab 02/19/21 2324 02/23/21 1605  NA 134* 136  K 3.7 4.1  CL 99 101  CO2 24 24  GLUCOSE 254* 235*  BUN 16 35*  CREATININE 1.21 2.11*  CALCIUM 8.9 8.1*   GFR: Estimated Creatinine Clearance: 31.3 mL/min (A) (by C-G  formula based  on SCr of 2.11 mg/dL (H)). Liver Function Tests: Recent Labs  Lab 02/19/21 2324 02/23/21 1605  AST 15 113*  ALT 12 27  ALKPHOS 62 65  BILITOT 0.8 1.4*  PROT 6.5 6.1*  ALBUMIN 3.9 3.3*   No results for input(s): LIPASE, AMYLASE in the last 168 hours. No results for input(s): AMMONIA in the last 168 hours. Coagulation Profile: Recent Labs  Lab 02/23/21 1605  INR 1.2   Cardiac Enzymes: No results for input(s): CKTOTAL, CKMB, CKMBINDEX, TROPONINI in the last 168 hours. BNP (last 3 results) No results for input(s): PROBNP in the last 8760 hours. HbA1C: No results for input(s): HGBA1C in the last 72 hours. CBG: No results for input(s): GLUCAP in the last 168 hours. Lipid Profile: No results for input(s): CHOL, HDL, LDLCALC, TRIG, CHOLHDL, LDLDIRECT in the last 72 hours. Thyroid Function Tests: No results for input(s): TSH, T4TOTAL, FREET4, T3FREE, THYROIDAB in the last 72 hours. Anemia Panel: No results for input(s): VITAMINB12, FOLATE, FERRITIN, TIBC, IRON, RETICCTPCT in the last 72 hours. Urine analysis:    Component Value Date/Time   COLORURINE BROWN (A) 02/23/2021 1654   APPEARANCEUR TURBID (A) 02/23/2021 1654   APPEARANCEUR Clear 04/29/2018 1529   LABSPEC  02/23/2021 1654    TEST NOT REPORTED DUE TO COLOR INTERFERENCE OF URINE PIGMENT   PHURINE  02/23/2021 1654    TEST NOT REPORTED DUE TO COLOR INTERFERENCE OF URINE PIGMENT   GLUCOSEU (A) 02/23/2021 1654    TEST NOT REPORTED DUE TO COLOR INTERFERENCE OF URINE PIGMENT   HGBUR (A) 02/23/2021 1654    TEST NOT REPORTED DUE TO COLOR INTERFERENCE OF URINE PIGMENT   BILIRUBINUR (A) 02/23/2021 1654    TEST NOT REPORTED DUE TO COLOR INTERFERENCE OF URINE PIGMENT   BILIRUBINUR Negative 04/29/2018 1529   KETONESUR (A) 02/23/2021 1654    TEST NOT REPORTED DUE TO COLOR INTERFERENCE OF URINE PIGMENT   PROTEINUR (A) 02/23/2021 1654    TEST NOT REPORTED DUE TO COLOR INTERFERENCE OF URINE PIGMENT   UROBILINOGEN 0.2  03/21/2014 0243   NITRITE (A) 02/23/2021 1654    TEST NOT REPORTED DUE TO COLOR INTERFERENCE OF URINE PIGMENT   LEUKOCYTESUR (A) 02/23/2021 1654    TEST NOT REPORTED DUE TO COLOR INTERFERENCE OF URINE PIGMENT   Sepsis Labs: _0 (procalcitonin:4,lacticidven:4) ) Recent Results (from the past 240 hour(s))  Urine Culture     Status: Abnormal   Collection Time: 02/19/21 11:59 PM   Specimen: Urine, Clean Catch  Result Value Ref Range Status   Specimen Description   Final    URINE, CLEAN CATCH Performed at [facility], [place] [address], [place], [place] [address]    Special Requests   Final    NONE Performed at [facility] [place] [address], [place], [place] [address]    Culture MULTIPLE SPECIES PRESENT, SUGGEST RECOLLECTION (A)  Final   Report Status 02/21/2021 FINAL  Final  Resp Panel by RT-PCR (Flu A&B, Covid) Nasopharyngeal Swab     Status: None   Collection Time: 02/23/21  4:44 PM   Specimen: Nasopharyngeal Swab; Nasopharyngeal(NP) swabs in vial transport medium  Result Value Ref Range Status   SARS Coronavirus 2 by RT PCR NEGATIVE NEGATIVE Final    Comment: (NOTE) SARS-CoV-2 target nucleic acids are NOT DETECTED.  The SARS-CoV-2 RNA is generally detectable in upper respiratory specimens during the acute phase of infection. The lowest concentration of SARS-CoV-2 viral copies this assay can detect is 138 copies/mL. A negative  result does not preclude SARS-Cov-2 infection and should not be used as the sole basis for treatment or other patient management decisions. A negative result may occur with  improper specimen collection/handling, submission of specimen other than nasopharyngeal swab, presence of viral mutation(s) within the areas targeted by this assay, and inadequate number of viral copies(<138 copies/mL). A negative result must be combined with clinical observations, patient history, and epidemiological information. The  expected result is Negative.  Fact Sheet for Patients:  EntrepreneurPulse.com.au  Fact Sheet for Healthcare Providers:  IncredibleEmployment.be  This test is no t yet approved or cleared by the [place] FDA and  has been authorized for detection and/or diagnosis of SARS-CoV-2 by FDA under an Emergency Use Authorization (EUA). This EUA will remain  in effect (meaning this test can be used) for the duration of the COVID-19 declaration under Section 564(b)(1) of the Act, 21 U.S.C.section 360bbb-3(b)(1), unless the authorization is terminated  or revoked sooner.       Influenza A by PCR NEGATIVE NEGATIVE Final   Influenza B by PCR NEGATIVE NEGATIVE Final    Comment: (NOTE) The Xpert Xpress SARS-CoV-2/FLU/RSV plus assay is intended as an aid in the diagnosis of influenza from Nasopharyngeal swab specimens and should not be used as a sole basis for treatment. Nasal washings and aspirates are unacceptable for Xpert Xpress SARS-CoV-2/FLU/RSV testing.  Fact Sheet for Patients: EntrepreneurPulse.com.au  Fact Sheet for Healthcare Providers: IncredibleEmployment.be  This test is not yet approved or cleared by the [place] FDA and has been authorized for detection and/or diagnosis of SARS-CoV-2 by FDA under an Emergency Use Authorization (EUA). This EUA will remain in effect (meaning this test can be used) for the duration of the COVID-19 declaration under Section 564(b)(1) of the Act, 21 U.S.C. section 360bbb-3(b)(1), unless the authorization is terminated or revoked.  Performed at [facility], [place] [address], [place], [place] [address]      Radiological Exams on Admission: CT ABDOMEN PELVIS WO CONTRAST  Result Date: 02/23/2021 CLINICAL DATA:  Abdominal pain, fever. EXAM: CT ABDOMEN AND PELVIS WITHOUT CONTRAST TECHNIQUE: Multidetector CT imaging of the abdomen and pelvis was  performed following the standard protocol without IV contrast. COMPARISON:  02/20/2021 FINDINGS: Lower chest: Stable moderate pericardial effusion. Low-density blood pool suggesting anemia. Coronary and descending thoracic aortic atherosclerotic calcification. Dependent subsegmental atelectasis in both lower lobes. Hepatobiliary: Unremarkable Pancreas: Unremarkable Spleen: Unremarkable Adrenals/Urinary Tract: Both adrenal glands appear normal. Foley catheter noted in the urinary bladder. There is a left double-J ureteral stent with proximal and distal loops formed in the renal pelvis and urinary bladder, respectively. Continued accentuated density in the left renal collecting system, density up to about 59 Hounsfield units, formerly 61 Hounsfield units. Decreased to scree of distention of the left renal collecting system compared to previous. No substantial hydroureter. Left perirenal stranding with some mild high density components as on the prior exam, although nodularity along the posterior perirenal fascia is reduced. These likely represent blood products. Stomach/Bowel: There are few mildly dilated loops of small bowel in the left upper quadrant, without an obvious transition. Formed stool in the colon. Mild sigmoid colon diverticulosis. Vascular/Lymphatic: Atherosclerosis is present, including aortoiliac atherosclerotic disease. No pathologic adenopathy identified. Reproductive: Unremarkable Other: No supplemental non-categorized findings. Musculoskeletal: Mild bilateral degenerative hip arthropathy. Lower lumbar spondylosis. Rim calcified synovial cyst posterior to the left L5-S1 facet joint, not in a position to cause impingement. Transitional S1 vertebra. IMPRESSION: 1. Reduced dilation of the left renal collecting system  although with some persistent high density material in the collecting system especially in the lower pole likely from blood products. 2. Reduced high-density nodularity along the perirenal  space posteriorly, likely from clot retraction/early resorption. 3. Left double-J ureteral stent remains in place. No appreciable hydroureter despite the mild prominence of the lower pole collecting system. 4. Stable moderate pericardial effusion. 5. Low-density blood pool suggests anemia. 6.  Aortic Atherosclerosis (ICD10-I70.0).  Coronary atherosclerosis. 7. Several mildly dilated loops of small bowel in the left upper quadrant, query ileus or mild enteritis. No obvious transition to indicate obstruction. Electronically Signed   By: Van Clines M.D.   On: 02/23/2021 18:07   CT HEAD WO CONTRAST (5MM)  Result Date: 02/23/2021 CLINICAL DATA:  Head trauma, minor EXAM: CT HEAD WITHOUT CONTRAST TECHNIQUE: Contiguous axial images were obtained from the base of the skull through the vertex without intravenous contrast. COMPARISON:  2015 FINDINGS: Brain: There is no acute intracranial hemorrhage, mass effect, or edema. Gray-white differentiation is preserved. There is no extra-axial fluid collection. Ventricles and sulci are within normal limits in size and configuration. Patchy low-attenuation in the supratentorial white matter is nonspecific but may reflect mild chronic microvascular ischemic changes. Vascular: There is atherosclerotic calcification at the skull base. Skull: Calvarium is unremarkable. Sinuses/Orbits: No acute finding. Other: None. IMPRESSION: No acute intracranial abnormality. Electronically Signed   By: Macy Mis M.D.   On: 02/23/2021 18:00   DG Chest Port 1 View  Result Date: 02/23/2021 CLINICAL DATA:  Weakness decreased mobility EXAM: PORTABLE CHEST 1 VIEW COMPARISON:  09/18/2019 FINDINGS: Electronic recording device over left chest. No focal opacity or pleural effusion. Normal cardiomediastinal silhouette. No pneumothorax. IMPRESSION: No active disease. Electronically Signed   By: Donavan Foil M.D.   On: 02/23/2021 17:08    EKG: Independently reviewed.  Sinus  tachycardia  Assessment/Plan Principal Problem:   Severe Sepsis secondary to UTI [facility]) Active Problems:   Coronary artery disease involving native coronary artery of native heart without angina pectoris   Atrial fibrillation s/p PVI ablation at [facility] 2001   Obstructive sleep apnea syndrome   Cirrhosis of liver without ascites (HCC)   Type 2 diabetes mellitus without complications ([place])   AKI (acute kidney injury) ([place])   Anemia   Thrombocytopenia (HCC)     #1 severe sepsis: Secondary to UTI.  Patient has met sepsis criteria with temperature, heart rate, and severe sepsis with hypotension and early hypoxia.  We will monitor closely with aggressive IV hydration was LR.  Continue with sepsis protocol.  IV cefepime.  Blood cultures and urine cultures obtained.  Follow closely.  #2 symptomatic anemia: Has significant tachycardia.  Hemoglobin below 7.0 at 6.8 g.  We will transfuse 1 unit of packed red blood cells.  In the setting of known coronary artery disease preferably hemoglobin close to 10 g.  Patient has been having hematuria as indicated since his procedure in August.  He may require more than a unit if hemoglobin is not stable and he continues to bleed.  Urology to see patient in the morning.  #3 AKI: We will get renal ultrasound.  Suspect prerenal with his hypotension and sepsis.  Continue aggressive hydration  #4 thrombocytopenia: May be contributing to his hematuria and bleeding.  We will hold anticoagulants and use SCD for prophylaxis.  #5 coronary artery disease: No evidence of decompensation.  We will transfuse to avoid triggering an attack.  #6 obstructive sleep apnea: We will offer CPAP at night.  #7 history  of atrial fibrillation: Status post previous ablation in 2001.  Not on chronic anticoagulation.  Continued rate control with diltiazem.  #8 type 2 diabetes: On oral hypoglycemics including metformin and Actos.  Hold metformin.  Sliding scale insulin   DVT prophylaxis:  SCD Code Status: Full code Family Communication: No family at bedside Disposition Plan: To be determined Consults called: Urologist Dr. Ezzie Dural Admission status: Inpatient   Severity of Illness: The appropriate patient status for this patient is INPATIENT. Inpatient status is judged to be reasonable and necessary in order to provide the required intensity of service to ensure the patient's safety. The patient's presenting symptoms, physical exam findings, and initial radiographic and laboratory data in the context of their chronic comorbidities is felt to place them at high risk for further clinical deterioration. Furthermore, it is not anticipated that the patient will be medically stable for discharge from the hospital within 2 midnights of admission. The following factors support the patient status of inpatient.   " The patient's presenting symptoms include Chills, fever and hematuria. " The worrisome physical exam findings include Fever, chills, hematuria. " The initial radiographic and laboratory data are worrisome because of Anemia, UTI, sepsis criteria. " The chronic co-morbidities include Nephrolithiasis.   * I certify that at the point of admission it is my clinical judgment that the patient will require inpatient hospital care spanning beyond 2 midnights from the point of admission due to high intensity of service, high risk for further deterioration and high frequency of surveillance required.Barbette Merino MD [facility] Pager [phone]  If 7PM-7AM, please contact night-coverage [email or web address] Password [email or web address]  02/23/2021, 11:40 PM

## 2021-02-23 NOTE — Progress Notes (Signed)
Patient arrived to the unit at 2015 via stretcher, unable to ambulate to bed. Arrived with blood transfusing and yellow MEWS. Will initiate yellow MEWS protocol. Charge Nurse notified.  Will notify on-call provider.

## 2021-02-23 NOTE — ED Notes (Signed)
Hgb 6.8 received from lab. Provider notified and aware.

## 2021-02-23 NOTE — Progress Notes (Signed)
Pharmacy Antibiotic Note  Kristopher [name] is a 70 y.o. male admitted on 02/23/2021 with UTI.  Pharmacy has been consulted for cefepime dosing for 7 days total.  Plan: Cefepime 2 g IV every 12 hours x 7 days total Monitor clinical picture, renal function F/U C&S, abx deescalation   Height: 5' 7"  (170.2 cm) Weight: 72.5 kg (159 lb 13.3 oz) IBW/kg (Calculated) : 66.1  Temp (24hrs), Avg:98.9 F (37.2 C), Min:97.7 F (36.5 C), Max:100.7 F (38.2 C)  Recent Labs  Lab 02/19/21 2324 02/23/21 1605 02/23/21 1819  WBC 6.4 9.0  --   CREATININE 1.21 2.11*  --   LATICACIDVEN  --  1.2 1.4    Estimated Creatinine Clearance: 31.3 mL/min (A) (by C-G formula based on SCr of 2.11 mg/dL (H)).    Allergies  Allergen Reactions   Codeine Nausea And Vomiting   Fentanyl Nausea And Vomiting   Naproxen Nausea Only   Other Nausea And Vomiting    1. N/V to all opiates. He is able to take versed. (noted 09/29/13) 2.The patient states that he has had  a reaction to all narcotics. He can take them though. (noted 06/26/13)    Antimicrobials this admission: 9/7 cefepime >>   Dose adjustments this admission:   Microbiology results: 9/7 BCx: sent 9/7 UCx: sent  9/3 UCx: multiple species present, suggest recollection   Thank you for allowing pharmacy to be a part of this patient's care.  Suzzanne Cloud, PharmD, BCPS 02/23/2021 9:25 PM

## 2021-02-23 NOTE — ED Triage Notes (Signed)
BIBA Per EMS: Pt coming from home with c/o weakness and decreased mobility x2days. Pt had kidney stone x2weeks ago and had a stent placed. Pt has blood in his urine bag and his last HgB was 8.0. PCP aware. BP upon arrival 90/50, 436m fluids given. 447mzofran given. 100/60 BP  110 HR  94% RA 250 CBG  18 G L AC

## 2021-02-23 NOTE — Progress Notes (Signed)
   02/23/21 2018  Assess: MEWS Score  Temp 97.7 F (36.5 C)  BP (!) 95/58  Pulse Rate (!) 106  Resp 20  SpO2 96 %  Assess: MEWS Score  MEWS Temp 0  MEWS Systolic 1  MEWS Pulse 1  MEWS RR 0  MEWS LOC 0  MEWS Score 2  MEWS Score Color Yellow  Assess: if the MEWS score is Yellow or Red  Were vital signs taken at a resting state? Yes  Focused Assessment Change from prior assessment (see assessment flowsheet)  Does the patient meet 2 or more of the SIRS criteria? Yes  Does the patient have a confirmed or suspected source of infection? Yes  Provider and Rapid Response Notified? Yes  MEWS guidelines implemented *See Row Information* Yes  Treat  MEWS Interventions Administered scheduled meds/treatments  Take Vital Signs  Increase Vital Sign Frequency  Yellow: Q 2hr X 2 then Q 4hr X 2, if remains yellow, continue Q 4hrs  Escalate  MEWS: Escalate Yellow: discuss with charge nurse/RN and consider discussing with provider and RRT  Notify: Charge Nurse/RN  Name of Charge Nurse/RN Notified Tom R  Date Charge Nurse/RN Notified 03/02/21  Time Charge Nurse/RN Notified 2030  Notify: Provider  Provider Name/Title Olena Heckle  Date Provider Notified 02/23/21  Time Provider Notified 2030  Notification Type Page  Notification Reason Change in status  Provider response No new orders  Date of Provider Response  (Pending)  Notify: Rapid Response  Name of Rapid Response RN Notified Mandy  Date Rapid Response Notified 02/23/21  Time Rapid Response Notified 2030  Document  Patient Outcome Stabilized after interventions  Progress note created (see row info) Yes  Assess: SIRS CRITERIA  SIRS Temperature  0  SIRS Pulse 1  SIRS Respirations  0  SIRS WBC 0  SIRS Score Sum  1

## 2021-02-23 NOTE — Progress Notes (Signed)
   02/23/21 2227  Assess: MEWS Score  Temp 99.6 F (37.6 C)  BP (!) 122/102  Pulse Rate (!) 155  Resp 20  SpO2 (!) 72 % (not accurate reading  93 room air)  O2 Device Room Air  Assess: MEWS Score  MEWS Temp 0  MEWS Systolic 0  MEWS Pulse 3  MEWS RR 0  MEWS LOC 0  MEWS Score 3  MEWS Score Color Yellow  Assess: if the MEWS score is Yellow or Red  Were vital signs taken at a resting state? No  Focused Assessment Change from prior assessment (see assessment flowsheet)  Does the patient meet 2 or more of the SIRS criteria? Yes  Does the patient have a confirmed or suspected source of infection? Yes  Provider and Rapid Response Notified? Yes  MEWS guidelines implemented *See Row Information* No, previously yellow, continue vital signs every 4 hours  Take Vital Signs  Increase Vital Sign Frequency  Yellow: Q 2hr X 2 then Q 4hr X 2, if remains yellow, continue Q 4hrs  Escalate  MEWS: Escalate Yellow: discuss with charge nurse/RN and consider discussing with provider and RRT  Notify: Charge Nurse/RN  Name of Charge Nurse/RN Notified Tom R  Date Charge Nurse/RN Notified 02/23/21  Time Charge Nurse/RN Notified 2230  Notify: Provider  Provider Name/Title Olena Heckle  Date Provider Notified 02/23/21  Time Provider Notified 2030  Notification Type Page  Notification Reason Change in status  Provider response No new orders  Date of Provider Response 02/23/21  Time of Provider Response 2245  Notify: Rapid Response  Name of Rapid Response RN Notified Mandy  Date Rapid Response Notified 02/23/21  Time Rapid Response Notified 2230  Document  Patient Outcome Other (Comment)  Progress note created (see row info) Yes  Assess: SIRS CRITERIA  SIRS Temperature  0  SIRS Pulse 1  SIRS Respirations  0  SIRS WBC 0  SIRS Score Sum  1

## 2021-02-23 NOTE — Progress Notes (Signed)
A consult was received from an ED physician for cefepime per pharmacy dosing.  The patient's profile has been reviewed for ht/wt/allergies/indication/available labs.    I agree with the one time order placed for cefepime 2 g IV by provider.  Further antibiotics/pharmacy consults should be ordered by admitting physician if indicated.                       Thank you, Suzzanne Cloud, PharmD, BCPS 02/23/2021  4:23 PM

## 2021-02-23 NOTE — ED Notes (Signed)
Urine leg bag changed due to bloody clots in the bag and inability to drain.

## 2021-02-24 ENCOUNTER — Encounter (HOSPITAL_COMMUNITY): Payer: Self-pay | Admitting: Internal Medicine

## 2021-02-24 LAB — GLUCOSE, CAPILLARY
Glucose-Capillary: 166 mg/dL — ABNORMAL HIGH (ref 70–99)
Glucose-Capillary: 138 mg/dL — ABNORMAL HIGH (ref 70–99)
Glucose-Capillary: 134 mg/dL — ABNORMAL HIGH (ref 70–99)
Glucose-Capillary: 141 mg/dL — ABNORMAL HIGH (ref 70–99)

## 2021-02-24 LAB — BLOOD CULTURE ID PANEL (REFLEXED) - BCID2
A.calcoaceticus-baumannii: NOT DETECTED
Bacteroides fragilis: NOT DETECTED
CTX-M ESBL: DETECTED — AB
Candida albicans: NOT DETECTED
Candida auris: NOT DETECTED
Candida glabrata: NOT DETECTED
Candida krusei: NOT DETECTED
Candida parapsilosis: NOT DETECTED
Candida tropicalis: NOT DETECTED
Carbapenem resist OXA 48 LIKE: NOT DETECTED
Carbapenem resistance IMP: NOT DETECTED
Carbapenem resistance KPC: NOT DETECTED
Carbapenem resistance NDM: NOT DETECTED
Carbapenem resistance VIM: NOT DETECTED
Cryptococcus neoformans/gattii: NOT DETECTED
Enterobacter cloacae complex: NOT DETECTED
Enterococcus Faecium: NOT DETECTED
Enterococcus faecalis: NOT DETECTED
Escherichia coli: NOT DETECTED
Haemophilus influenzae: NOT DETECTED
Klebsiella aerogenes: NOT DETECTED
Klebsiella oxytoca: NOT DETECTED
Klebsiella pneumoniae: DETECTED — AB
Listeria monocytogenes: NOT DETECTED
Neisseria meningitidis: NOT DETECTED
Proteus species: NOT DETECTED
Pseudomonas aeruginosa: NOT DETECTED
Salmonella species: NOT DETECTED
Serratia marcescens: NOT DETECTED
Staphylococcus aureus (BCID): NOT DETECTED
Staphylococcus epidermidis: NOT DETECTED
Staphylococcus lugdunensis: NOT DETECTED
Staphylococcus species: NOT DETECTED
Stenotrophomonas maltophilia: NOT DETECTED
Streptococcus agalactiae: NOT DETECTED
Streptococcus pneumoniae: NOT DETECTED
Streptococcus pyogenes: NOT DETECTED
Streptococcus species: NOT DETECTED

## 2021-02-24 LAB — COMPREHENSIVE METABOLIC PANEL
ALT: 47 U/L — ABNORMAL HIGH (ref 0–44)
AST: 174 U/L — ABNORMAL HIGH (ref 15–41)
Albumin: 2.8 g/dL — ABNORMAL LOW (ref 3.5–5.0)
Alkaline Phosphatase: 62 U/L (ref 38–126)
Anion gap: 9 (ref 5–15)
BUN: 35 mg/dL — ABNORMAL HIGH (ref 8–23)
CO2: 26 mmol/L (ref 22–32)
Calcium: 8 mg/dL — ABNORMAL LOW (ref 8.9–10.3)
Chloride: 101 mmol/L (ref 98–111)
Creatinine, Ser: 1.99 mg/dL — ABNORMAL HIGH (ref 0.61–1.24)
GFR, Estimated: 36 mL/min — ABNORMAL LOW (ref >60)
Glucose, Bld: 228 mg/dL — ABNORMAL HIGH (ref 70–99)
Potassium: 4.3 mmol/L (ref 3.5–5.1)
Sodium: 136 mmol/L (ref 135–145)
Total Bilirubin: 1.4 mg/dL — ABNORMAL HIGH (ref 0.3–1.2)
Total Protein: 5.9 g/dL — ABNORMAL LOW (ref 6.5–8.1)

## 2021-02-24 LAB — PREPARE RBC (CROSSMATCH)

## 2021-02-24 LAB — PROCALCITONIN: Procalcitonin: 34.6 ng/mL

## 2021-02-24 LAB — CBC
HCT: 24.5 % — ABNORMAL LOW (ref 39.0–52.0)
Hemoglobin: 8.6 g/dL — ABNORMAL LOW (ref 13.0–17.0)
MCH: 31.7 pg (ref 26.0–34.0)
MCHC: 35.1 g/dL (ref 30.0–36.0)
MCV: 90.4 fL (ref 80.0–100.0)
Platelets: 111 10*3/uL — ABNORMAL LOW (ref 150–400)
RBC: 2.71 MIL/uL — ABNORMAL LOW (ref 4.22–5.81)
RDW: 17.1 % — ABNORMAL HIGH (ref 11.5–15.5)
WBC: 7.8 10*3/uL (ref 4.0–10.5)
nRBC: 0 % (ref 0.0–0.2)

## 2021-02-24 LAB — CORTISOL-AM, BLOOD: Cortisol - AM: 36.7 ug/dL — ABNORMAL HIGH (ref 6.7–22.6)

## 2021-02-24 LAB — PROTIME-INR
INR: 1.2 (ref 0.8–1.2)
Prothrombin Time: 15.1 seconds (ref 11.4–15.2)

## 2021-02-24 LAB — HIV ANTIBODY (ROUTINE TESTING W REFLEX): HIV Screen 4th Generation wRfx: NONREACTIVE

## 2021-02-24 MED ORDER — CHLORHEXIDINE GLUCONATE CLOTH 2 % EX PADS
6.0000 | MEDICATED_PAD | Freq: Every day | CUTANEOUS | Status: DC
  Administered 2021-02-24 – 2021-03-03 (×5): 6 via TOPICAL

## 2021-02-24 MED ORDER — SODIUM CHLORIDE 0.9 % IV SOLN
1.0000 g | Freq: Two times a day (BID) | INTRAVENOUS | Status: AC
  Administered 2021-02-24 – 2021-03-02 (×14): 1 g via INTRAVENOUS
  Filled 2021-02-24 (×16): qty 1

## 2021-02-24 MED ORDER — SODIUM CHLORIDE 0.9 % IV SOLN
INTRAVENOUS | Status: DC | PRN
Start: 2021-02-24 — End: 2021-03-04
  Administered 2021-02-24: 250 mL via INTRAVENOUS
  Administered 2021-02-28: 1000 mL via INTRAVENOUS

## 2021-02-24 MED ORDER — ENSURE ENLIVE PO LIQD: 237.0000 mL | ORAL | Status: DC

## 2021-02-24 MED ORDER — TRAMADOL HCL 50 MG PO TABS
100.0000 mg | ORAL_TABLET | Freq: Four times a day (QID) | ORAL | Status: DC | PRN
  Administered 2021-02-24 – 2021-02-27 (×5): 100 mg via ORAL
  Filled 2021-02-24 (×6): qty 2

## 2021-02-24 MED ORDER — SODIUM CHLORIDE 0.9 % IV SOLN
12.5000 mg | Freq: Four times a day (QID) | INTRAVENOUS | Status: DC | PRN
  Administered 2021-02-24 – 2021-02-25 (×2): 12.5 mg via INTRAVENOUS
  Filled 2021-02-24 (×2): qty 12.5
  Filled 2021-02-24 (×2): qty 0.5

## 2021-02-24 MED ORDER — SODIUM CHLORIDE 0.9 % IV BOLUS
500.0000 mL | Freq: Once | INTRAVENOUS | Status: AC
  Administered 2021-02-24: 500 mL via INTRAVENOUS

## 2021-02-24 MED ORDER — ADULT MULTIVITAMIN W/MINERALS CH
1.0000 | ORAL_TABLET | Freq: Every day | ORAL | Status: DC
  Administered 2021-02-25 – 2021-02-26 (×2): 1 via ORAL
  Filled 2021-02-24 (×3): qty 1

## 2021-02-24 MED ORDER — SODIUM CHLORIDE 0.9 % IV SOLN
10.0000 mL/h | Freq: Once | INTRAVENOUS | Status: AC
  Administered 2021-02-24: 1000 mL/h via INTRAVENOUS

## 2021-02-24 NOTE — Progress Notes (Signed)
PROGRESS NOTE    TERY [name]  JOI:[id number] DOB: Aug 08, 1950 DOA: 02/23/2021 PCP: Townsend Roger, MD    Brief Narrative:  [name] was admitted to the hospital working diagnosis of severe sepsis due to urinary tract infection, Klebsiella bacteremia, ESBL,  70 year old male past medical history for atrial fibrillation, coronary artery disease, nonalcoholic cirrhosis, dyslipidemia, type 2 diabetes mellitus, and urolithiasis who presented with fevers, chills and hematuria. 8/26 patient underwent cystoscopy, left retrograde pyelogram with interpretation, left percutaneous renal access, left nephrolithotomy, left nephrostogram, left ureteral stent. Postprocedure he had a Foley catheter placed for urinary retention. On 9/3 patient returned to the ED because urinary retention.  Apparently his Foley catheter was blocked by blood clot at home and he removed it himself.  Foley catheter was replaced the patient was discharged home. Patient continued to have fevers, chills along with back pain at home that prompted him to come back to the hospital.  On his initial physical examination his temperature was 103 F, blood pressure 87/59, heart rate 155, respiratory 28, oxygen saturation 72% on room air.  He was awake and alert, dry mucous membranes, lungs clear to auscultation bilaterally, heart S1-S2, tachycardic, abdomen soft nontender.  Sodium 136, potassium 4.1, chloride 101, bicarb 24, glucose 235, BUN 35, creatinine 2.1, AST 113, ALT 27, high sensitive troponin 39, lactic acid 1.2, white count 9.0, hemoglobin 6.8, hematocrit 20.1, platelets 125. SARS COVID-19 negative.  Urinalysis > 50 white cells, > 50 red cells.  Brown color, turbid.  CT of the abdomen pelvis left double-J ureteral stent in place.  No hydroureter.  Several mildly dilated loops of small bowel in the upper quadrant, no obvious transition to indicate obstruction.  Chest radiograph no infiltrates.  EKG 107 bpm, normal axis, normal  intervals, sinus rhythm, no significant ST segment changes, negative T waves V4-V6.   Assessment & Plan:   Principal Problem:   Severe Sepsis secondary to UTI Kristopher [name]) Active Problems:   Coronary artery disease involving native coronary artery of native heart without angina pectoris   Atrial fibrillation s/p PVI ablation at [facility] 2001   Obstructive sleep apnea syndrome   Cirrhosis of liver without ascites (HCC)   Type 2 diabetes mellitus without complications (HCC)   AKI (acute kidney injury) (HCC)   Anemia   Thrombocytopenia (HCC)   Severe sepsis due to urinary tract infection (complicated) in the setting of indwelling foley catheter. (Gram negative bacteremia) Patient continue to have back pain and nausea, his blood pressure continue to be low 97/62 mmHg and tachycardic 107 bpm, this am temp 101.4 F.  Urine culture and blood culture positive for Klebsiella ESBL. CT with no evident abscess.   Plan to continue isotonic saline bolus for MAP less than 65  Maintenance IV fluids with LR at 100 ml per hr If persistent hypotension, will need vasopressors. Change antibiotic therapy to meropenem.  Transfer patient to progressive care unit.   2. Acute blood loss anemia due to hematuria. thrombocytopenia Sp 1 units PRBC transfusion.  Follow up hgb is up to 8,6 and hct at 24.5. platelets 174 Continue close follow up on cell count.  Discontinue aspirin for now.  Continue with tamsulosin  3. AKI renal function with serum cr at 1,99 with K at 4,3 and serum bicarbonate at 26. No hydronephrosis per CT.   4. Atrial fibrillation sp ablation. Continue close telemetry monitoring, in the setting of sepsis patient has a high risk for recurrent atrial fibrillation.  Continue with diltiazem every 36  hours.   5. T2DM/ dyslipidemia. Fasting glucose this am 228, continue glucose cover and monitoring with insulin sliding scale.  Discontinue pioglitazone for now.  Continue with rosuvastatin.   Patient  continue to be at high risk for worsening sepsis   Status is: Inpatient  Remains inpatient appropriate because:IV treatments appropriate due to intensity of illness or inability to take PO  Dispo: The patient is from: Home              Anticipated d/c is to: Home              Patient currently is not medically stable to d/c.   Difficult to place patient No  DVT prophylaxis: Scd   Code Status:    full  Family Communication:  I spoke with patient's wife  at the bedside, we talked in detail about patient's condition, plan of care and prognosis and all questions were addressed.     Consultants:  Urology called    Antimicrobials:  Meropenem.     Subjective: Patient with nausea and back pain, no dyspnea or chest pain, foley cathter in place, no worsening hematuria   Objective: Vitals:   02/24/21 0410 02/24/21 0734 02/24/21 0854 02/24/21 1100  BP: (!) 94/58 114/85  (!) 87/62  Pulse: (!) 102 (!) 110  (!) 107  Resp: 20 20  20   Temp: 98.1 F (36.7 C) (!) 101.4 F (38.6 C) 98.1 F (36.7 C) 97.6 F (36.4 C)  TempSrc: Oral Oral Oral Oral  SpO2: 97% 90%  94%  Weight:      Height:        Intake/Output Summary (Last 24 hours) at 02/24/2021 1135 Last data filed at 02/24/2021 1021 Gross per 24 hour  Intake 4295.63 ml  Output 825 ml  Net 3470.63 ml   Filed Weights   02/23/21 1541  Weight: 72.5 kg    Examination:   General: Not in pain or dyspnea, deconditioned  Neurology: Awake and alert, non focal  E ENT: positive pallor, no icterus, oral mucosa moist Cardiovascular: No JVD. S1-S2 present, rhythmic, no gallops, rubs, or murmurs. No lower extremity edema. Pulmonary: positive breath sounds bilaterally, adequate air movement, no wheezing, rhonchi or rales. Gastrointestinal. Abdomen soft and non tender Skin. Left flank ecchymosis.  Musculoskeletal: no joint deformities     Data Reviewed: I have personally reviewed following labs and imaging studies  CBC: Recent Labs   Lab 02/19/21 2324 02/23/21 1605 02/24/21 0501  WBC 6.4 9.0 7.8  NEUTROABS  --  8.4*  --   HGB 9.0* 6.8* 8.6*  HCT 26.4* 20.1* 24.5*  MCV 93.6 95.3 90.4  PLT 181 125* 093*   Basic Metabolic Panel: Recent Labs  Lab 02/19/21 2324 02/23/21 1605 02/24/21 0501  NA 134* 136 136  K 3.7 4.1 4.3  CL 99 101 101  CO2 24 24 26   GLUCOSE 254* 235* 228*  BUN 16 35* 35*  CREATININE 1.21 2.11* 1.99*  CALCIUM 8.9 8.1* 8.0*   GFR: Estimated Creatinine Clearance: 33.2 mL/min (A) (by C-G formula based on SCr of 1.99 mg/dL (H)). Liver Function Tests: Recent Labs  Lab 02/19/21 2324 02/23/21 1605 02/24/21 0501  AST 15 113* 174*  ALT 12 27 47*  ALKPHOS 62 65 62  BILITOT 0.8 1.4* 1.4*  PROT 6.5 6.1* 5.9*  ALBUMIN 3.9 3.3* 2.8*   No results for input(s): LIPASE, AMYLASE in the last 168 hours. No results for input(s): AMMONIA in the last 168 hours. Coagulation Profile: Recent Labs  Lab 02/23/21 1605 02/24/21 0501  INR 1.2 1.2   Cardiac Enzymes: No results for input(s): CKTOTAL, CKMB, CKMBINDEX, TROPONINI in the last 168 hours. BNP (last 3 results) No results for input(s): PROBNP in the last 8760 hours. HbA1C: No results for input(s): HGBA1C in the last 72 hours. CBG: Recent Labs  Lab 02/23/21 2344 02/24/21 0746  GLUCAP 217* 166*   Lipid Profile: No results for input(s): CHOL, HDL, LDLCALC, TRIG, CHOLHDL, LDLDIRECT in the last 72 hours. Thyroid Function Tests: No results for input(s): TSH, T4TOTAL, FREET4, T3FREE, THYROIDAB in the last 72 hours. Anemia Panel: No results for input(s): VITAMINB12, FOLATE, FERRITIN, TIBC, IRON, RETICCTPCT in the last 72 hours.    Radiology Studies: I have reviewed all of the imaging during this hospital visit personally     Scheduled Meds:  sodium chloride   Intravenous Once   aspirin EC  81 mg Oral QPM   baclofen  10 mg Oral TID   buPROPion  300 mg Oral Daily   Chlorhexidine Gluconate Cloth  6 each Topical Daily   diltiazem  120  mg Oral Q36H   gabapentin  600 mg Oral TID   insulin aspart  0-15 Units Subcutaneous TID WC   insulin aspart  0-5 Units Subcutaneous QHS   niacin  500 mg Oral Daily   pantoprazole  40 mg Oral BID   pioglitazone  15 mg Oral Daily   rOPINIRole  1 mg Oral QHS   rosuvastatin  5 mg Oral QPM   tamsulosin  0.4 mg Oral QPM   Continuous Infusions:  sodium chloride 250 mL (02/24/21 0535)   lactated ringers 125 mL/hr at 02/24/21 0745   meropenem (MERREM) IV 1 g (02/24/21 1107)   promethazine (PHENERGAN) injection (IM or IVPB)     sodium chloride       LOS: 1 day        Kristopher [name] Gerome Apley, MD

## 2021-02-24 NOTE — Progress Notes (Addendum)
Patient fluid bolus complete. BP is still 86/64 MAP 72 HR 98. Made [facility] Mel Almond RN and Dr. Cathlean Sauer aware. Dr. Cathlean Sauer wants to keep MAP >65. Spoke w/ [employer] and made him aware of patient. Will continue to monitor.

## 2021-02-24 NOTE — Progress Notes (Signed)
Pts BP 89/60, goal MAP>65. Pt is yellow MEWS. MD notified.

## 2021-02-24 NOTE — Progress Notes (Signed)
0709 MEWS vital signs late d/t shift change and patient had just drank water and requested that we wait 15-30 minutes to be sure we got an accurate temp.

## 2021-02-24 NOTE — Significant Event (Signed)
Rapid Response Event Note   Reason for Call :  Rapidly elevating MEWS due to fever, tachycardia. Had previously discussed patient with unit charge nurse twice in last hour when bedside nurse first noticed yellow MEWS of 2-3.   Initial Focused Assessment:  See Flowsheets for 2320 - 2341. Patient admitted for urosepsis due to left kidney stone and recent stent placement. Pt c/o left side dull pain with 3/10 severity. Pt reports nausea (has received Zofran) and acknowledges that opioids also elicit nausea. Admission documentation and orders incomplete as MD has just seen patient prior to RR. Fever is 103, HR tachy in 120's, BP unremarkable, O2 sat within expected range on room air. Foley catheter draining dark tea colored cloudy urine with blood clots. Patient had brief moment of disorientation during this visit, which was reported to provider and thought to be related to fever / infection.  Of note, patient received one unit of blood earlier tonight for low hemoglobin. Anticipate labs to be repeated and reviewed by physician at his discretion.   Interventions:  Notified admitting MD, Dr. Jonelle Sidle that just saw patient. On call NP at bedside, but must defer to admitting MD.  Orders obtained for fever and pain management. Ice packs placed over patient. Room temp decreased. Advised patient to maintain light coverings and allow ice to remain near major vessels as placed. Unit charge nurse administered Ibuprofen as prescribed. Stayed with patient until this task completed. Advised to follow protocols and will continue to monitor.  Plan of Care:  Patient will have vitals rechecked per MEWS protocol and remain on unit at this time. Staff will call RR RN if changes in condition.   Event Summary:   MD Notified: 2320 Call Time: [place] Time: 2315 End Time: 2350  Selinda Michaels, RN

## 2021-02-24 NOTE — Consult Note (Addendum)
Urology Consult   Physician requesting consult: Gala Romney  Reason for consult: Hematuria and UTI  History of Present Illness: Kristopher [name] is a 70 y.o. male with PMH of afib, CAD, nonalcoholic cirrhosis, DM, nephrolithiasis, and urinary retention who presented to the ED last night with c/o hematuria, fever, and chills. He was admitted for treatment of sepsis due to UTI and anemia. Labs on admission WBC 9, Cr 2.2 (baseline 0.8), Hg 6.8. UA nothing reported due to color interference.  Blood culture positive for Klebsiella and ESBL.  Urine culture pending. He was started on Merrem IV. CT scan shows no hydro with foley and left ureteral stent in good position.    Pt is s/p PCNL by Dr. Louis Meckel on 02/11/21 with left ureteral stent placement.  He has had hematuria since procedure.  On 02/17/21 he was evaled in our office for urinary retention at which time a foley was placed with ~600c urine drainage.  He was also started on Tamsulosin. At some point later that day the foley clotted off with no drainage and he removed the foley at home per his wife's report.  He then presented to the ED for pain and urinary retention on 02/19/21.  His foley was replaced and has been draining "fairly well" since that time per his wife.  He has continued to have hematuria.    He is currently resting comfortably in the bed.  He does have some left flank discomfort that is relieved with pain medication. He denies F/C, HA, CP, SOB, N/V, diarrhea/constipation, and abdominal pain.      Past Medical History:  Diagnosis Date   Anemia 12/2020   Anginal pain ([place])    Atrial fibrillation ([place])    a. s/p PVI X2 by Dr Omelia Blackwater at [facility] around 2000.   CAD (coronary artery disease)    a. NSTEMI (06/2013):  LHC (06/27/2013):  pLAD 10-20, RCA 90, EF 45-50%, inf HK.  PCI:  Xience Alpine (3 x 23 mm) DES to the RCA. b. Relook cath 07/10/13 - patent stent. Imdur added. c. Recurrent CP after that - abnormal nuc 07/2017 with chest pain, dyspnea,  hypotensive response. Rest images with mid-basal inferior thinning. No stress images due to abrupt d/c of test. Relook cath 3/12 - no significant r   Chronic back pain greater than 3 months duration    Cirrhosis, non-alcoholic (HCC)    Depression    DJD (degenerative joint disease) of cervical spine    "3 herniated discs" (09/01/2013)   DJD (degenerative joint disease), lumbosacral    "bulging L5-S1" (09/01/2013)   Dysrhythmia    PVCs   Herniated cervical disc    c2-T1 fusion   HLD (hyperlipidemia)    Ischemic cardiomyopathy    a. Echo (06/28/2011): Inferior and inferoseptal akinesis, EF 12%, grade 2 diastolic dysfunction, MAC, small effusion (no tamponade). B. EF 40% by echo 06/2013, cath 07/2013, 55% by cath 08/2013 with subtle mild mid inferior hypocontractility.   Liver cirrhosis secondary to NASH [facility])    NSTEMI (non-ST elevated myocardial infarction) ([place]) 06/27/2013   Osteoarthritis    "neck and lower back" (09/01/2013)   Pericardial effusion    a. Chronic since hx of pericarditis in 2004. b. Mod by echo 06/2013, small by echo 06/2013.   Pneumonia ~ 2005   Type II diabetes mellitus ([place])     Past Surgical History:  Procedure Laterality Date   APPENDECTOMY  ~ 1965   ATRIAL FIBRILLATION ABLATION  2000   Dr Omelia Blackwater  at [place] ANGIOGRAM N/A 10/07/2013   Procedure: LEFT AND RIGHT HEART CATHETERIZATION WITH CORONARY ANGIOGRAM;  Surgeon: Troy Sine, MD;  Location: [facility] CATH LAB;  Service: Cardiovascular;  Laterality: N/A;   LEFT HEART CATHETERIZATION WITH CORONARY ANGIOGRAM N/A 06/27/2013   Procedure: LEFT HEART CATHETERIZATION WITH CORONARY ANGIOGRAM;  Surgeon: Troy Sine, MD;  Location: [facility] CATH LAB;  Service: Cardiovascular;  Laterality: N/A;   LEFT HEART CATHETERIZATION WITH CORONARY ANGIOGRAM N/A 07/10/2013   Procedure: LEFT HEART CATHETERIZATION WITH CORONARY ANGIOGRAM;  Surgeon: Burnell Blanks, MD;  Location: [facility] CATH LAB;   Service: Cardiovascular;  Laterality: N/A;   LEFT HEART CATHETERIZATION WITH CORONARY ANGIOGRAM N/A 08/28/2013   Procedure: LEFT HEART CATHETERIZATION WITH CORONARY ANGIOGRAM;  Surgeon: Troy Sine, MD;  Location: [facility] CATH LAB;  Service: Cardiovascular;  Laterality: N/A;   NEPHROLITHOTOMY Left 02/11/2021   Procedure: LEFT NEPHROLITHOTOMY PERCUTANEOUS WITH SURGEON ACCESS;  Surgeon: [name], MD;  Location: [facility] ORS;  Service: Urology;  Laterality: Left;   PERCUTANEOUS CORONARY STENT INTERVENTION (PCI-S)  06/27/2013   Procedure: PERCUTANEOUS CORONARY STENT INTERVENTION (PCI-S);  Surgeon: Troy Sine, MD;  Location: [facility] CATH LAB;  Service: Cardiovascular;;   POSTERIOR CERVICAL FUSION/FORAMINOTOMY  2009   fusion c3-t1   PULMONARY VEIN ABLATION  2001   Dr Omelia Blackwater at [facility] Medications:  Home Meds:. Current Meds  Medication Sig   aspirin EC 81 MG tablet Take 81 mg by mouth every evening.    baclofen (LIORESAL) 10 MG tablet Take 1 tablet (10 mg total) by mouth 3 (three) times daily.   buPROPion (WELLBUTRIN XL) 300 MG 24 hr tablet Take 300 mg by mouth daily.   ciprofloxacin (CIPRO) 500 MG tablet Take 500 mg by mouth 2 (two) times daily.   clopidogrel (PLAVIX) 75 MG tablet Take 75 mg by mouth daily.   diltiazem (CARDIZEM SR) 120 MG 12 hr capsule Take 120 mg by mouth See admin instructions. Take 120 mg by mouth every 36 hours   gabapentin (NEURONTIN) 600 MG tablet Take 600 mg by mouth 3 (three) times daily.   ibuprofen (ADVIL) 200 MG tablet Take 800 mg by mouth every 6 (six) hours as needed for moderate pain.   metFORMIN (GLUCOPHAGE-XR) 500 MG 24 hr tablet Take 1,000 mg by mouth 2 (two) times daily.   niacin 500 MG tablet Take 500 mg by mouth daily.   nitroGLYCERIN (NITROSTAT) 0.4 MG SL tablet Place 0.4 mg under the tongue every 5 (five) minutes as needed for chest pain.   pantoprazole (PROTONIX) 40 MG tablet Take 40 mg by mouth 2 (two) times  daily.   pioglitazone (ACTOS) 15 MG tablet Take 15 mg by mouth daily.   promethazine (PHENERGAN) 25 MG tablet Take 25 mg by mouth every 6 (six) hours as needed for nausea or vomiting.   rOPINIRole (REQUIP) 1 MG tablet Take 1 mg by mouth at bedtime.   rosuvastatin (CRESTOR) 5 MG tablet Take 5 mg by mouth every evening.   tamsulosin (FLOMAX) 0.4 MG CAPS capsule Take 0.4 mg by mouth every evening.   traMADol (ULTRAM) 50 MG tablet Take 1-2 tablets (50-100 mg total) by mouth every 6 (six) hours as needed for moderate pain. (Patient taking differently: Take 50 mg by mouth every 6 (six) hours as needed for moderate pain.)      Scheduled Meds:  sodium chloride   Intravenous Once  baclofen  10 mg Oral TID   buPROPion  300 mg Oral Daily   Chlorhexidine Gluconate Cloth  6 each Topical Daily   diltiazem  120 mg Oral Q36H   feeding supplement  237 mL Oral Q24H   gabapentin  600 mg Oral TID   insulin aspart  0-15 Units Subcutaneous TID WC   insulin aspart  0-5 Units Subcutaneous QHS   multivitamin with minerals  1 tablet Oral Daily   niacin  500 mg Oral Daily   pantoprazole  40 mg Oral BID   rOPINIRole  1 mg Oral QHS   rosuvastatin  5 mg Oral QPM   tamsulosin  0.4 mg Oral QPM   Continuous Infusions:  sodium chloride 250 mL (02/24/21 0535)   lactated ringers 100 mL/hr at 02/24/21 1141   meropenem (MERREM) IV 1 g (02/24/21 1107)   promethazine (PHENERGAN) injection (IM or IVPB)     PRN Meds:.sodium chloride, ibuprofen, nitroGLYCERIN, ondansetron **OR** ondansetron (ZOFRAN) IV, promethazine (PHENERGAN) injection (IM or IVPB), traMADol  Allergies:  Allergies  Allergen Reactions   Codeine Nausea And Vomiting   Fentanyl Nausea And Vomiting   Naproxen Nausea Only   Other Nausea And Vomiting    1. N/V to all opiates. He is able to take versed. (noted 09/29/13) 2.The patient states that he has had  a reaction to all narcotics. He can take them though. (noted 06/26/13)    Family History   Problem Relation Age of Onset   COPD Other    Cancer Other    Diabetes Other    Heart disease Other    CAD Neg Hx        No hx early CAD    Social History:  reports that he quit smoking about 13 years ago. His smoking use included cigarettes. He has a 64.50 pack-year smoking history. He has never used smokeless tobacco. He reports current alcohol use. He reports that he does not use drugs.  ROS: A complete review of systems was performed.  All systems are negative except for pertinent findings as noted.  Physical Exam:  Vital signs in last 24 hours: Temp:  [97.6 F (36.4 C)-103 F (39.4 C)] 97.6 F (36.4 C) (09/08 1100) Pulse Rate:  [64-155] 107 (09/08 1100) Resp:  [16-28] 20 (09/08 1100) BP: (87-167)/(52-131) 87/62 (09/08 1100) SpO2:  [72 %-100 %] 94 % (09/08 1100) Weight:  [72.5 kg] 72.5 kg (09/07 1541) Constitutional:  Alert and oriented, No acute distress Cardiovascular: Regular rate and rhythm Respiratory: Normal respiratory effort GI: Abdomen is soft, nontender, nondistended, no abdominal masses GU: No CVA tenderness; foley in place with dark red urine in bag and clear/yellow urine in tubing Lymphatic: No lymphadenopathy Neurologic: Grossly intact, no focal deficits Psychiatric: Normal mood and affect  Laboratory Data:  Recent Labs    02/23/21 1605 02/24/21 0501  WBC 9.0 7.8  HGB 6.8* 8.6*  HCT 20.1* 24.5*  PLT 125* 111*    Recent Labs    02/23/21 1605 02/24/21 0501  NA 136 136  K 4.1 4.3  CL 101 101  GLUCOSE 235* 228*  BUN 35* 35*  CALCIUM 8.1* 8.0*  CREATININE 2.11* 1.99*     Results for orders placed or performed during the hospital encounter of 02/23/21 (from the past 24 hour(s))  Lactic acid, plasma     Status: None   Collection Time: 02/23/21  4:05 PM  Result Value Ref Range   Lactic Acid, Venous 1.2 0.5 - 1.9 mmol/L  Comprehensive  metabolic panel     Status: Abnormal   Collection Time: 02/23/21  4:05 PM  Result Value Ref Range   Sodium  136 135 - 145 mmol/L   Potassium 4.1 3.5 - 5.1 mmol/L   Chloride 101 98 - 111 mmol/L   CO2 24 22 - 32 mmol/L   Glucose, Bld 235 (H) 70 - 99 mg/dL   BUN 35 (H) 8 - 23 mg/dL   Creatinine, Ser 2.11 (H) 0.61 - 1.24 mg/dL   Calcium 8.1 (L) 8.9 - 10.3 mg/dL   Total Protein 6.1 (L) 6.5 - 8.1 g/dL   Albumin 3.3 (L) 3.5 - 5.0 g/dL   AST 113 (H) 15 - 41 U/L   ALT 27 0 - 44 U/L   Alkaline Phosphatase 65 38 - 126 U/L   Total Bilirubin 1.4 (H) 0.3 - 1.2 mg/dL   GFR, Estimated 33 (L) >60 mL/min   Anion gap 11 5 - 15  CBC WITH DIFFERENTIAL     Status: Abnormal   Collection Time: 02/23/21  4:05 PM  Result Value Ref Range   WBC 9.0 4.0 - 10.5 K/uL   RBC 2.11 (L) 4.22 - 5.81 MIL/uL   Hemoglobin 6.8 (LL) 13.0 - 17.0 g/dL   HCT 20.1 (L) 39.0 - 52.0 %   MCV 95.3 80.0 - 100.0 fL   MCH 32.2 26.0 - 34.0 pg   MCHC 33.8 30.0 - 36.0 g/dL   RDW 17.5 (H) 11.5 - 15.5 %   Platelets 125 (L) 150 - 400 K/uL   nRBC 0.0 0.0 - 0.2 %   Neutrophils Relative % 94 %   Neutro Abs 8.4 (H) 1.7 - 7.7 K/uL   Lymphocytes Relative 1 %   Lymphs Abs 0.1 (L) 0.7 - 4.0 K/uL   Monocytes Relative 5 %   Monocytes Absolute 0.4 0.1 - 1.0 K/uL   Eosinophils Relative 0 %   Eosinophils Absolute 0.0 0.0 - 0.5 K/uL   Basophils Relative 0 %   Basophils Absolute 0.0 0.0 - 0.1 K/uL   Immature Granulocytes 0 %   Abs Immature Granulocytes 0.03 0.00 - 0.07 K/uL  Protime-INR     Status: Abnormal   Collection Time: 02/23/21  4:05 PM  Result Value Ref Range   Prothrombin Time 15.5 (H) 11.4 - 15.2 seconds   INR 1.2 0.8 - 1.2  APTT     Status: None   Collection Time: 02/23/21  4:05 PM  Result Value Ref Range   aPTT 31 24 - 36 seconds  Blood Culture (routine x 2)     Status: None (Preliminary result)   Collection Time: 02/23/21  4:05 PM   Specimen: BLOOD RIGHT FOREARM  Result Value Ref Range   Specimen Description      BLOOD RIGHT FOREARM Performed at [facility], [address], [place], [place] [address]     Special Requests      BOTTLES DRAWN AEROBIC AND ANAEROBIC Blood Culture adequate volume Performed at [place] [address], [place], [place] [address]    Culture  Setup Time      GRAM NEGATIVE RODS IN BOTH AEROBIC AND ANAEROBIC BOTTLES CRITICAL RESULT CALLED TO, READ BACK BY AND VERIFIED WITH: PHARMD M BELL [id number] AT 907 AM BY CM Performed at [facility], [place] [address], [place], [place] [address]    Culture GRAM NEGATIVE RODS    Report Status PENDING   Troponin I (High Sensitivity)     Status:  Abnormal   Collection Time: 02/23/21  4:05 PM  Result Value Ref Range   Troponin I (High Sensitivity) 39 (H) <18 ng/L  Type and screen [place]     Status: None (Preliminary result)   Collection Time: 02/23/21  4:05 PM  Result Value Ref Range   ABO/RH(D) A POS    Antibody Screen NEG    Sample Expiration 02/26/2021,2359    Unit Number [id number]    Blood Component Type RED CELLS,LR    Unit division 00    Status of Unit ISSUED,FINAL    Transfusion Status OK TO TRANSFUSE    Crossmatch Result      Compatible Performed at [facility], [place] [address], [place], [place] [address]    Unit Number [id number]    Blood Component Type RED CELLS,LR    Unit division 00    Status of Unit ISSUED    Transfusion Status OK TO TRANSFUSE    Crossmatch Result Compatible   Blood Culture ID Panel (Reflexed)     Status: Abnormal (Preliminary result)   Collection Time: 02/23/21  4:05 PM  Result Value Ref Range   Enterococcus faecalis NOT DETECTED NOT DETECTED   Enterococcus Faecium NOT DETECTED NOT DETECTED   Listeria monocytogenes NOT DETECTED NOT DETECTED   Staphylococcus species NOT DETECTED NOT DETECTED   Staphylococcus aureus (BCID) NOT DETECTED NOT DETECTED   Staphylococcus epidermidis NOT DETECTED NOT DETECTED   Staphylococcus lugdunensis NOT DETECTED NOT DETECTED   Streptococcus species NOT DETECTED NOT DETECTED    Streptococcus agalactiae NOT DETECTED NOT DETECTED   Streptococcus pneumoniae NOT DETECTED NOT DETECTED   Streptococcus pyogenes NOT DETECTED NOT DETECTED   A.calcoaceticus-baumannii NOT DETECTED NOT DETECTED   Bacteroides fragilis NOT DETECTED NOT DETECTED   Enterobacterales PENDING NOT DETECTED   Enterobacter cloacae complex NOT DETECTED NOT DETECTED   Escherichia coli NOT DETECTED NOT DETECTED   Klebsiella aerogenes NOT DETECTED NOT DETECTED   Klebsiella oxytoca NOT DETECTED NOT DETECTED   Klebsiella pneumoniae DETECTED (A) NOT DETECTED   Proteus species NOT DETECTED NOT DETECTED   Salmonella species NOT DETECTED NOT DETECTED   Serratia marcescens NOT DETECTED NOT DETECTED   Haemophilus influenzae NOT DETECTED NOT DETECTED   Neisseria meningitidis NOT DETECTED NOT DETECTED   Pseudomonas aeruginosa NOT DETECTED NOT DETECTED   Stenotrophomonas maltophilia NOT DETECTED NOT DETECTED   Candida albicans NOT DETECTED NOT DETECTED   Candida auris NOT DETECTED NOT DETECTED   Candida glabrata NOT DETECTED NOT DETECTED   Candida krusei NOT DETECTED NOT DETECTED   Candida parapsilosis NOT DETECTED NOT DETECTED   Candida tropicalis NOT DETECTED NOT DETECTED   Cryptococcus neoformans/gattii NOT DETECTED NOT DETECTED   CTX-M ESBL DETECTED (A) NOT DETECTED   Carbapenem resistance IMP NOT DETECTED NOT DETECTED   Carbapenem resistance KPC NOT DETECTED NOT DETECTED   Carbapenem resistance NDM NOT DETECTED NOT DETECTED   Carbapenem resist OXA 48 LIKE NOT DETECTED NOT DETECTED   Carbapenem resistance VIM NOT DETECTED NOT DETECTED  Blood Culture (routine x 2)     Status: None (Preliminary result)   Collection Time: 02/23/21  4:24 PM   Specimen: BLOOD  Result Value Ref Range   Specimen Description      BLOOD LEFT ANTECUBITAL Performed at [facility], [place] [address] [place], [place] [address]    Special Requests      BOTTLES DRAWN AEROBIC AND ANAEROBIC Blood Culture adequate  volume Performed at [facility], [address] [place], [place] [address]    Culture      NO GROWTH < 12 HOURS Performed at [place] [address], [place], [place] [address]    Report Status PENDING   Resp Panel by RT-PCR (Flu A&B, Covid) Nasopharyngeal Swab     Status: None   Collection Time: 02/23/21  4:44 PM   Specimen: Nasopharyngeal Swab; Nasopharyngeal(NP) swabs in vial transport medium  Result Value Ref Range   SARS Coronavirus 2 by RT PCR NEGATIVE NEGATIVE   Influenza A by PCR NEGATIVE NEGATIVE   Influenza B by PCR NEGATIVE NEGATIVE  Urinalysis, Routine w reflex microscopic Nasopharyngeal Swab     Status: Abnormal   Collection Time: 02/23/21  4:54 PM  Result Value Ref Range   Color, Urine BROWN (A) YELLOW   APPearance TURBID (A) CLEAR   Specific Gravity, Urine  1.005 - 1.030    TEST NOT REPORTED DUE TO COLOR INTERFERENCE OF URINE PIGMENT   pH  5.0 - 8.0    TEST NOT REPORTED DUE TO COLOR INTERFERENCE OF URINE PIGMENT   Glucose, UA (A) NEGATIVE mg/dL    TEST NOT REPORTED DUE TO COLOR INTERFERENCE OF URINE PIGMENT   Hgb urine dipstick (A) NEGATIVE    TEST NOT REPORTED DUE TO COLOR INTERFERENCE OF URINE PIGMENT   Bilirubin Urine (A) NEGATIVE    TEST NOT REPORTED DUE TO COLOR INTERFERENCE OF URINE PIGMENT   Ketones, ur (A) NEGATIVE mg/dL    TEST NOT REPORTED DUE TO COLOR INTERFERENCE OF URINE PIGMENT   Protein, ur (A) NEGATIVE mg/dL    TEST NOT REPORTED DUE TO COLOR INTERFERENCE OF URINE PIGMENT   Nitrite (A) NEGATIVE    TEST NOT REPORTED DUE TO COLOR INTERFERENCE OF URINE PIGMENT   Leukocytes,Ua (A) NEGATIVE    TEST NOT REPORTED DUE TO COLOR INTERFERENCE OF URINE PIGMENT   RBC / HPF >50 (H) 0 - 5 RBC/hpf   WBC, UA >50 (H) 0 - 5 WBC/hpf   Bacteria, UA MANY (A) NONE SEEN   Mucus PRESENT   Prepare RBC (crossmatch)     Status: None   Collection Time: 02/23/21  5:03 PM  Result Value Ref Range   Order Confirmation      ORDER PROCESSED BY  BLOOD BANK Performed at [facility], [place] [address] [place], [place] [address]   Lactic acid, plasma     Status: None   Collection Time: 02/23/21  6:19 PM  Result Value Ref Range   Lactic Acid, Venous 1.4 0.5 - 1.9 mmol/L  Troponin I (High Sensitivity)     Status: Abnormal   Collection Time: 02/23/21  6:20 PM  Result Value Ref Range   Troponin I (High Sensitivity) 38 (H) <18 ng/L  Glucose, capillary     Status: Abnormal   Collection Time: 02/23/21 11:44 PM  Result Value Ref Range   Glucose-Capillary 217 (H) 70 - 99 mg/dL   Comment 1 Document in Chart   Prepare RBC (crossmatch)     Status: None   Collection Time: 02/23/21 11:46 PM  Result Value Ref Range   Order Confirmation      ORDER PROCESSED BY BLOOD BANK Performed at [facility], [place] [address], [place], [place] [address]   HIV Antibody (routine testing w rflx)     Status: None   Collection Time: 02/24/21  5:01 AM  Result Value Ref Range   HIV Screen 4th Generation wRfx Non Reactive Non  Reactive  Protime-INR     Status: None   Collection Time: 02/24/21  5:01 AM  Result Value Ref Range   Prothrombin Time 15.1 11.4 - 15.2 seconds   INR 1.2 0.8 - 1.2  Cortisol-am, blood     Status: Abnormal   Collection Time: 02/24/21  5:01 AM  Result Value Ref Range   Cortisol - AM 36.7 (H) 6.7 - 22.6 ug/dL  Procalcitonin     Status: None   Collection Time: 02/24/21  5:01 AM  Result Value Ref Range   Procalcitonin 34.60 ng/mL  CBC     Status: Abnormal   Collection Time: 02/24/21  5:01 AM  Result Value Ref Range   WBC 7.8 4.0 - 10.5 K/uL   RBC 2.71 (L) 4.22 - 5.81 MIL/uL   Hemoglobin 8.6 (L) 13.0 - 17.0 g/dL   HCT 24.5 (L) 39.0 - 52.0 %   MCV 90.4 80.0 - 100.0 fL   MCH 31.7 26.0 - 34.0 pg   MCHC 35.1 30.0 - 36.0 g/dL   RDW 17.1 (H) 11.5 - 15.5 %   Platelets 111 (L) 150 - 400 K/uL   nRBC 0.0 0.0 - 0.2 %  Comprehensive metabolic panel     Status: Abnormal   Collection Time: 02/24/21  5:01  AM  Result Value Ref Range   Sodium 136 135 - 145 mmol/L   Potassium 4.3 3.5 - 5.1 mmol/L   Chloride 101 98 - 111 mmol/L   CO2 26 22 - 32 mmol/L   Glucose, Bld 228 (H) 70 - 99 mg/dL   BUN 35 (H) 8 - 23 mg/dL   Creatinine, Ser 1.99 (H) 0.61 - 1.24 mg/dL   Calcium 8.0 (L) 8.9 - 10.3 mg/dL   Total Protein 5.9 (L) 6.5 - 8.1 g/dL   Albumin 2.8 (L) 3.5 - 5.0 g/dL   AST 174 (H) 15 - 41 U/L   ALT 47 (H) 0 - 44 U/L   Alkaline Phosphatase 62 38 - 126 U/L   Total Bilirubin 1.4 (H) 0.3 - 1.2 mg/dL   GFR, Estimated 36 (L) >60 mL/min   Anion gap 9 5 - 15  Glucose, capillary     Status: Abnormal   Collection Time: 02/24/21  7:46 AM  Result Value Ref Range   Glucose-Capillary 166 (H) 70 - 99 mg/dL   Comment 1 Notify RN   Glucose, capillary     Status: Abnormal   Collection Time: 02/24/21 11:43 AM  Result Value Ref Range   Glucose-Capillary 138 (H) 70 - 99 mg/dL   Comment 1 Notify RN    Recent Results (from the past 240 hour(s))  Urine Culture     Status: Abnormal   Collection Time: 02/19/21 11:59 PM   Specimen: Urine, Clean Catch  Result Value Ref Range Status   Specimen Description   Final    URINE, CLEAN CATCH Performed at [facility], [place] [address] [place], [place] [address]    Special Requests   Final    NONE Performed at [facility], [place] [address], [place], [place] [address]    Culture MULTIPLE SPECIES PRESENT, SUGGEST RECOLLECTION (A)  Final   Report Status 02/21/2021 FINAL  Final  Blood Culture (routine x 2)     Status: None (Preliminary result)   Collection Time: 02/23/21  4:05 PM   Specimen: BLOOD RIGHT FOREARM  Result Value Ref Range Status   Specimen Description   Final    BLOOD RIGHT FOREARM Performed  at [facility], [place] [address] [place], [place] [address]    Special Requests   Final    BOTTLES DRAWN AEROBIC AND ANAEROBIC Blood Culture adequate volume Performed at [place] [address], [place], [place] [address]    Culture  Setup Time   Final    GRAM NEGATIVE RODS IN BOTH AEROBIC AND ANAEROBIC BOTTLES CRITICAL RESULT CALLED TO, READ BACK BY AND VERIFIED WITH: PHARMD M BELL [id number] AT 907 AM BY CM Performed at [facility], [place] [address] [place], [place] [address]    Culture GRAM NEGATIVE RODS  Final   Report Status PENDING  Incomplete  Blood Culture ID Panel (Reflexed)     Status: Abnormal (Preliminary result)   Collection Time: 02/23/21  4:05 PM  Result Value Ref Range Status   Enterococcus faecalis NOT DETECTED NOT DETECTED Final   Enterococcus Faecium NOT DETECTED NOT DETECTED Final   Listeria monocytogenes NOT DETECTED NOT DETECTED Final   Staphylococcus species NOT DETECTED NOT DETECTED Final   Staphylococcus aureus (BCID) NOT DETECTED NOT DETECTED Final   Staphylococcus epidermidis NOT DETECTED NOT DETECTED Final   Staphylococcus lugdunensis NOT DETECTED NOT DETECTED Final   Streptococcus species NOT DETECTED NOT DETECTED Final   Streptococcus agalactiae NOT DETECTED NOT DETECTED Final   Streptococcus pneumoniae NOT DETECTED NOT DETECTED Final   Streptococcus pyogenes NOT DETECTED NOT DETECTED Final   A.calcoaceticus-baumannii NOT DETECTED NOT DETECTED Final   Bacteroides fragilis NOT DETECTED NOT DETECTED Final   Enterobacterales PENDING NOT DETECTED Incomplete   Enterobacter cloacae complex NOT DETECTED NOT DETECTED Final   Escherichia coli NOT DETECTED NOT DETECTED Final   Klebsiella aerogenes NOT DETECTED NOT DETECTED Final   Klebsiella oxytoca NOT DETECTED NOT DETECTED Final   Klebsiella pneumoniae DETECTED (A) NOT DETECTED Final    Comment: CRITICAL RESULT CALLED TO, READ BACK BY AND VERIFIED WITH: PHARMD M BELL [id number] AT 907 AM BY CM    Proteus species NOT DETECTED NOT DETECTED Final   Salmonella species NOT DETECTED NOT DETECTED Final   Serratia marcescens NOT DETECTED NOT DETECTED Final   Haemophilus influenzae NOT DETECTED  NOT DETECTED Final   Neisseria meningitidis NOT DETECTED NOT DETECTED Final   Pseudomonas aeruginosa NOT DETECTED NOT DETECTED Final   Stenotrophomonas maltophilia NOT DETECTED NOT DETECTED Final   Candida albicans NOT DETECTED NOT DETECTED Final   Candida auris NOT DETECTED NOT DETECTED Final   Candida glabrata NOT DETECTED NOT DETECTED Final   Candida krusei NOT DETECTED NOT DETECTED Final   Candida parapsilosis NOT DETECTED NOT DETECTED Final   Candida tropicalis NOT DETECTED NOT DETECTED Final   Cryptococcus neoformans/gattii NOT DETECTED NOT DETECTED Final   CTX-M ESBL DETECTED (A) NOT DETECTED Final    Comment: CRITICAL RESULT CALLED TO, READ BACK BY AND VERIFIED WITH: PHARMD M BELL [id number] AT 907 AM BY CM (NOTE) Extended spectrum beta-lactamase detected. Recommend a carbapenem as initial therapy.      Carbapenem resistance IMP NOT DETECTED NOT DETECTED Final   Carbapenem resistance KPC NOT DETECTED NOT DETECTED Final   Carbapenem resistance NDM NOT DETECTED NOT DETECTED Final   Carbapenem resist OXA 48 LIKE NOT DETECTED NOT DETECTED Final   Carbapenem resistance VIM NOT DETECTED NOT DETECTED Final    Comment: Performed at [facility], [place] [address], [place], [place] [address]  Blood Culture (routine x 2)     Status: None (Preliminary result)   Collection Time: 02/23/21  4:24 PM   Specimen: BLOOD  Result Value Ref Range Status   Specimen Description   Final    BLOOD LEFT ANTECUBITAL Performed at [facility] [address], [place], [place] [address]    Special Requests   Final    BOTTLES DRAWN AEROBIC AND ANAEROBIC Blood Culture adequate volume Performed at [facility] [address], [place], [place] [address]    Culture   Final    NO GROWTH < 12 HOURS Performed at [facility] [address], [place], [place] [address]    Report Status PENDING  Incomplete  Resp Panel by RT-PCR (Flu A&B, Covid) Nasopharyngeal  Swab     Status: None   Collection Time: 02/23/21  4:44 PM   Specimen: Nasopharyngeal Swab; Nasopharyngeal(NP) swabs in vial transport medium  Result Value Ref Range Status   SARS Coronavirus 2 by RT PCR NEGATIVE NEGATIVE Final    Comment: (NOTE) SARS-CoV-2 target nucleic acids are NOT DETECTED.  The SARS-CoV-2 RNA is generally detectable in upper respiratory specimens during the acute phase of infection. The lowest concentration of SARS-CoV-2 viral copies this assay can detect is 138 copies/mL. A negative result does not preclude SARS-Cov-2 infection and should not be used as the sole basis for treatment or other patient management decisions. A negative result may occur with  improper specimen collection/handling, submission of specimen other than nasopharyngeal swab, presence of viral mutation(s) within the areas targeted by this assay, and inadequate number of viral copies(<138 copies/mL). A negative result must be combined with clinical observations, patient history, and epidemiological information. The expected result is Negative.  Fact Sheet for Patients:  EntrepreneurPulse.com.au  Fact Sheet for Healthcare Providers:  IncredibleEmployment.be  This test is no t yet approved or cleared by the [place] FDA and  has been authorized for detection and/or diagnosis of SARS-CoV-2 by FDA under an Emergency Use Authorization (EUA). This EUA will remain  in effect (meaning this test can be used) for the duration of the COVID-19 declaration under Section 564(b)(1) of the Act, 21 U.S.C.section 360bbb-3(b)(1), unless the authorization is terminated  or revoked sooner.       Influenza A by PCR NEGATIVE NEGATIVE Final   Influenza B by PCR NEGATIVE NEGATIVE Final    Comment: (NOTE) The Xpert Xpress SARS-CoV-2/FLU/RSV plus assay is intended as an aid in the diagnosis of influenza from Nasopharyngeal swab specimens and should not be used as a sole  basis for treatment. Nasal washings and aspirates are unacceptable for Xpert Xpress SARS-CoV-2/FLU/RSV testing.  Fact Sheet for Patients: EntrepreneurPulse.com.au  Fact Sheet for Healthcare Providers: IncredibleEmployment.be  This test is not yet approved or cleared by the [place] FDA and has been authorized for detection and/or diagnosis of SARS-CoV-2 by FDA under an Emergency Use Authorization (EUA). This EUA will remain in effect (meaning this test can be used) for the duration of the COVID-19 declaration under Section 564(b)(1) of the Act, 21 U.S.C. section 360bbb-3(b)(1), unless the authorization is terminated or revoked.  Performed at [facility], [place] [address], [place], [place] [address]     Renal Function: Recent Labs    02/19/21 2324 02/23/21 1605 02/24/21 0501  CREATININE 1.21 2.11* 1.99*   Estimated Creatinine Clearance: 33.2 mL/min (A) (by C-G formula based on SCr of 1.99 mg/dL (H)).  Radiologic Imaging: CT ABDOMEN PELVIS WO CONTRAST  Result Date: 02/23/2021 CLINICAL DATA:  Abdominal pain, fever. EXAM: CT ABDOMEN AND PELVIS WITHOUT CONTRAST TECHNIQUE: Multidetector  CT imaging of the abdomen and pelvis was performed following the standard protocol without IV contrast. COMPARISON:  02/20/2021 FINDINGS: Lower chest: Stable moderate pericardial effusion. Low-density blood pool suggesting anemia. Coronary and descending thoracic aortic atherosclerotic calcification. Dependent subsegmental atelectasis in both lower lobes. Hepatobiliary: Unremarkable Pancreas: Unremarkable Spleen: Unremarkable Adrenals/Urinary Tract: Both adrenal glands appear normal. [place] catheter noted in the urinary bladder. There is a left double-J ureteral stent with proximal and distal loops formed in the renal pelvis and urinary bladder, respectively. Continued accentuated density in the left renal collecting system, density up to about 59  Hounsfield units, formerly 61 Hounsfield units. Decreased to scree of distention of the left renal collecting system compared to previous. No substantial hydroureter. Left perirenal stranding with some mild high density components as on the prior exam, although nodularity along the posterior perirenal fascia is reduced. These likely represent blood products. Stomach/Bowel: There are few mildly dilated loops of small bowel in the left upper quadrant, without an obvious transition. Formed stool in the colon. Mild sigmoid colon diverticulosis. Vascular/Lymphatic: Atherosclerosis is present, including aortoiliac atherosclerotic disease. No pathologic adenopathy identified. Reproductive: Unremarkable Other: No supplemental non-categorized findings. Musculoskeletal: Mild bilateral degenerative hip arthropathy. Lower lumbar spondylosis. Rim calcified synovial cyst posterior to the left L5-S1 facet joint, not in a position to cause impingement. Transitional S1 vertebra. IMPRESSION: 1. Reduced dilation of the left renal collecting system although with some persistent high density material in the collecting system especially in the lower pole likely from blood products. 2. Reduced high-density nodularity along the perirenal space posteriorly, likely from clot retraction/early resorption. 3. Left double-J ureteral stent remains in place. No appreciable hydroureter despite the mild prominence of the lower pole collecting system. 4. Stable moderate pericardial effusion. 5. Low-density blood pool suggests anemia. 6.  Aortic Atherosclerosis (ICD10-I70.0).  Coronary atherosclerosis. 7. Several mildly dilated loops of small bowel in the left upper quadrant, query ileus or mild enteritis. No obvious transition to indicate obstruction. Electronically Signed   By: Van Clines M.D.   On: 02/23/2021 18:07   CT HEAD WO CONTRAST (5MM)  Result Date: 02/23/2021 CLINICAL DATA:  Head trauma, minor EXAM: CT HEAD WITHOUT CONTRAST  TECHNIQUE: Contiguous axial images were obtained from the base of the skull through the vertex without intravenous contrast. COMPARISON:  2015 FINDINGS: Brain: There is no acute intracranial hemorrhage, mass effect, or edema. Gray-white differentiation is preserved. There is no extra-axial fluid collection. Ventricles and sulci are within normal limits in size and configuration. Patchy low-attenuation in the supratentorial white matter is nonspecific but may reflect mild chronic microvascular ischemic changes. Vascular: There is atherosclerotic calcification at the skull base. Skull: Calvarium is unremarkable. Sinuses/Orbits: No acute finding. Other: None. IMPRESSION: No acute intracranial abnormality. Electronically Signed   By: Macy Mis M.D.   On: 02/23/2021 18:00   DG Chest Port 1 View  Result Date: 02/23/2021 CLINICAL DATA:  Weakness decreased mobility EXAM: PORTABLE CHEST 1 VIEW COMPARISON:  09/18/2019 FINDINGS: Electronic recording device over left chest. No focal opacity or pleural effusion. Normal cardiomediastinal silhouette. No pneumothorax. IMPRESSION: No active disease. Electronically Signed   By: Donavan Foil M.D.   On: 02/23/2021 17:08     Impression/Recommendation  Sepsis from UTI--improving. WBC and Cr trending down. F/u urine culture and continue IV ABx per IM. He is still hypotensive which is related to infection as well anemia.  He receiving fluid boluses and blood transfusions.   Hematuria--due to stent, retention, foley (placement and replacement), and possibly infection.  Should improve with stent and foley removal as well as resolution of infection. Hand irrigate foley prn to prevent clotting of foley. Advise avoiding NSAIDs while bleeding.   Retention--continue Flomax indefinitely.  Foley is currently draining well. Pt was scheduled for void trial and stent removal on 9/9.  Both can be done as an inpt or outpt pending when pt is discharged home.   Debbrah Alar 02/24/2021,  1:08 PM

## 2021-02-24 NOTE — Progress Notes (Signed)
Initial Nutrition Assessment  INTERVENTION:   -Ensure Plus PO daily, each provides 350 kcals and 13g protein  -Multivitamin with minerals daily  NUTRITION DIAGNOSIS:   Increased nutrient needs related to acute illness (sepsis) as evidenced by estimated needs.  GOAL:   Patient will meet greater than or equal to 90% of their needs  MONITOR:   PO intake, Supplement acceptance, Labs, I & O's, Weight trends  REASON FOR ASSESSMENT:   Malnutrition Screening Tool    ASSESSMENT:   70 y.o. male with medical history significant of atrial fibrillation, coronary artery disease, nonalcoholic cirrhosis, depression, hyperlipidemia, urinary retention, diabetes, who had urology procedure on August 25 for a large kidney stone.  Patient has been passing blood since the procedure and was seen in the ER with urinary retention on September 3, now back in the ER with fever chills and Urologic symptoms.  Patient meets sepsis criteria most likely secondary to UTI  Patient reports not eating well d/t abdominal pain, N/V PTA.  Pt is s/p rapid response earlier this morning d/t fever and tachycardia. Given poor PO, will order Ensure supplements daily.  Per weight records, no weight loss noted.  Medications: Niacin, Lactated ringers, IV Zofran  Labs reviewed:  CBGs: 138-217  NUTRITION - FOCUSED PHYSICAL EXAM:  No depletions noted.  Diet Order:   Diet Order             Diet heart healthy/carb modified Room service appropriate? Yes; Fluid consistency: Thin  Diet effective now                   EDUCATION NEEDS:   No education needs have been identified at this time  Skin:  Skin Assessment: Reviewed RN Assessment  Last BM:  9/7  Height:   Ht Readings from Last 1 Encounters:  02/23/21 5' 7"  (1.702 m)    Weight:   Wt Readings from Last 1 Encounters:  02/23/21 72.5 kg    BMI:  Body mass index is 25.03 kg/m.  Estimated Nutritional Needs:   Kcal:  1800-2000  Protein:   75-85g  Fluid:  2L/day   Clayton Bibles, MS, RD, LDN Inpatient Clinical Dietitian Contact information available via Amion

## 2021-02-24 NOTE — Progress Notes (Signed)
   02/24/21 0319  Assess: MEWS Score  Temp 97.7 F (36.5 C)  BP (!) 107/95  Pulse Rate (!) 102  Resp 20  SpO2 93 %  Assess: MEWS Score  MEWS Temp 0  MEWS Systolic 0  MEWS Pulse 1  MEWS RR 0  MEWS LOC 0  MEWS Score 1  MEWS Score Color Green  Assess: if the MEWS score is Yellow or Red  Were vital signs taken at a resting state? Yes  Focused Assessment No change from prior assessment  Does the patient meet 2 or more of the SIRS criteria? No  Does the patient have a confirmed or suspected source of infection? Yes  Provider and Rapid Response Notified? No  MEWS guidelines implemented *See Row Information* No, previously red, continue vital signs every 4 hours  Document  Patient Outcome Stabilized after interventions  Progress note created (see row info) Yes  Assess: SIRS CRITERIA  SIRS Temperature  0  SIRS Pulse 1  SIRS Respirations  0  SIRS WBC 0  SIRS Score Sum  1  Pt remain stable on the unit.

## 2021-02-24 NOTE — Progress Notes (Signed)
PHARMACY - PHYSICIAN COMMUNICATION CRITICAL VALUE ALERT - BLOOD CULTURE IDENTIFICATION (BCID)  Kristopher [name] is an 70 y.o. male with hx kidney stones s/p left ureteral stent placement on 8/26 who presented to [facility] on 02/23/2021 with a chief complaint of generalized weakness, fever, and chills.  He was started on cefepime on admission for sepsis/UTI.  One of four blood culture bottles collected on 9/7 has GNR (BCID= klebsiella  pneumo with CTX-M ESBL detected).  Name of physician (or Provider) Contacted: Dr. Cathlean Sauer  Current antibiotics: cefepime  Changes to prescribed antibiotics recommended:  - change to meropenem 1gm IV q12h (dose adjusted for renal function)  Results for orders placed or performed during the hospital encounter of 02/23/21  Blood Culture ID Panel (Reflexed) (Collected: 02/23/2021  4:05 PM)  Result Value Ref Range   Enterococcus faecalis NOT DETECTED NOT DETECTED   Enterococcus Faecium NOT DETECTED NOT DETECTED   Listeria monocytogenes NOT DETECTED NOT DETECTED   Staphylococcus species NOT DETECTED NOT DETECTED   Staphylococcus aureus (BCID) NOT DETECTED NOT DETECTED   Staphylococcus epidermidis NOT DETECTED NOT DETECTED   Staphylococcus lugdunensis NOT DETECTED NOT DETECTED   Streptococcus species NOT DETECTED NOT DETECTED   Streptococcus agalactiae NOT DETECTED NOT DETECTED   Streptococcus pneumoniae NOT DETECTED NOT DETECTED   Streptococcus pyogenes NOT DETECTED NOT DETECTED   A.calcoaceticus-baumannii NOT DETECTED NOT DETECTED   Bacteroides fragilis NOT DETECTED NOT DETECTED   Enterobacterales PENDING NOT DETECTED   Enterobacter cloacae complex NOT DETECTED NOT DETECTED   Escherichia coli NOT DETECTED NOT DETECTED   Klebsiella aerogenes NOT DETECTED NOT DETECTED   Klebsiella oxytoca NOT DETECTED NOT DETECTED   Klebsiella pneumoniae DETECTED (A) NOT DETECTED   Proteus species NOT DETECTED NOT DETECTED   Salmonella species NOT DETECTED NOT DETECTED   Serratia  marcescens NOT DETECTED NOT DETECTED   Haemophilus influenzae NOT DETECTED NOT DETECTED   Neisseria meningitidis NOT DETECTED NOT DETECTED   Pseudomonas aeruginosa NOT DETECTED NOT DETECTED   Stenotrophomonas maltophilia NOT DETECTED NOT DETECTED   Candida albicans NOT DETECTED NOT DETECTED   Candida auris NOT DETECTED NOT DETECTED   Candida glabrata NOT DETECTED NOT DETECTED   Candida krusei NOT DETECTED NOT DETECTED   Candida parapsilosis NOT DETECTED NOT DETECTED   Candida tropicalis NOT DETECTED NOT DETECTED   Cryptococcus neoformans/gattii NOT DETECTED NOT DETECTED   CTX-M ESBL DETECTED (A) NOT DETECTED   Carbapenem resistance IMP NOT DETECTED NOT DETECTED   Carbapenem resistance KPC NOT DETECTED NOT DETECTED   Carbapenem resistance NDM NOT DETECTED NOT DETECTED   Carbapenem resist OXA 48 LIKE NOT DETECTED NOT DETECTED   Carbapenem resistance VIM NOT DETECTED NOT DETECTED    Lynelle Doctor 02/24/2021  9:19 AM

## 2021-02-25 DIAGNOSIS — N179 Acute kidney failure, unspecified: Secondary | ICD-10-CM

## 2021-02-25 DIAGNOSIS — A419 Sepsis, unspecified organism: Secondary | ICD-10-CM

## 2021-02-25 LAB — CBC WITH DIFFERENTIAL/PLATELET
Abs Immature Granulocytes: 0.03 10*3/uL (ref 0.00–0.07)
Basophils Absolute: 0 10*3/uL (ref 0.0–0.1)
Basophils Relative: 0 %
Eosinophils Absolute: 0 10*3/uL (ref 0.0–0.5)
Eosinophils Relative: 0 %
HCT: 22.2 % — ABNORMAL LOW (ref 39.0–52.0)
Hemoglobin: 7.6 g/dL — ABNORMAL LOW (ref 13.0–17.0)
Immature Granulocytes: 1 %
Lymphocytes Relative: 3 %
Lymphs Abs: 0.1 10*3/uL — ABNORMAL LOW (ref 0.7–4.0)
MCH: 31.1 pg (ref 26.0–34.0)
MCHC: 34.2 g/dL (ref 30.0–36.0)
MCV: 91 fL (ref 80.0–100.0)
Monocytes Absolute: 0.3 10*3/uL (ref 0.1–1.0)
Monocytes Relative: 6 %
Neutro Abs: 3.8 10*3/uL (ref 1.7–7.7)
Neutrophils Relative %: 90 %
Platelets: 93 10*3/uL — ABNORMAL LOW (ref 150–400)
RBC: 2.44 MIL/uL — ABNORMAL LOW (ref 4.22–5.81)
RDW: 16.8 % — ABNORMAL HIGH (ref 11.5–15.5)
WBC: 4.2 10*3/uL (ref 4.0–10.5)
nRBC: 0 % (ref 0.0–0.2)

## 2021-02-25 LAB — BASIC METABOLIC PANEL
Anion gap: 11 (ref 5–15)
BUN: 35 mg/dL — ABNORMAL HIGH (ref 8–23)
CO2: 21 mmol/L — ABNORMAL LOW (ref 22–32)
Calcium: 7.1 mg/dL — ABNORMAL LOW (ref 8.9–10.3)
Chloride: 96 mmol/L — ABNORMAL LOW (ref 98–111)
Creatinine, Ser: 1.59 mg/dL — ABNORMAL HIGH (ref 0.61–1.24)
GFR, Estimated: 47 mL/min — ABNORMAL LOW (ref >60)
Glucose, Bld: 177 mg/dL — ABNORMAL HIGH (ref 70–99)
Potassium: 3.6 mmol/L (ref 3.5–5.1)
Sodium: 128 mmol/L — ABNORMAL LOW (ref 135–145)

## 2021-02-25 LAB — CBC
HCT: 25.7 % — ABNORMAL LOW (ref 39.0–52.0)
Hemoglobin: 8.8 g/dL — ABNORMAL LOW (ref 13.0–17.0)
MCH: 30.9 pg (ref 26.0–34.0)
MCHC: 34.2 g/dL (ref 30.0–36.0)
MCV: 90.2 fL (ref 80.0–100.0)
Platelets: 118 10*3/uL — ABNORMAL LOW (ref 150–400)
RBC: 2.85 MIL/uL — ABNORMAL LOW (ref 4.22–5.81)
RDW: 17 % — ABNORMAL HIGH (ref 11.5–15.5)
WBC: 6 10*3/uL (ref 4.0–10.5)
nRBC: 0 % (ref 0.0–0.2)

## 2021-02-25 LAB — GLUCOSE, CAPILLARY
Glucose-Capillary: 162 mg/dL — ABNORMAL HIGH (ref 70–99)
Glucose-Capillary: 147 mg/dL — ABNORMAL HIGH (ref 70–99)
Glucose-Capillary: 211 mg/dL — ABNORMAL HIGH (ref 70–99)
Glucose-Capillary: 154 mg/dL — ABNORMAL HIGH (ref 70–99)

## 2021-02-25 LAB — PREPARE RBC (CROSSMATCH)

## 2021-02-25 MED ORDER — SODIUM CHLORIDE 0.9 % IV SOLN: INTRAVENOUS | Status: DC

## 2021-02-25 MED ORDER — SODIUM CHLORIDE 0.9 % IV SOLN
25.0000 mg | Freq: Four times a day (QID) | INTRAVENOUS | Status: DC | PRN
  Administered 2021-02-26 – 2021-02-27 (×2): 25 mg via INTRAVENOUS
  Filled 2021-02-25: qty 1
  Filled 2021-02-25 (×2): qty 25

## 2021-02-25 MED ORDER — METOPROLOL TARTRATE 5 MG/5ML IV SOLN
5.0000 mg | Freq: Once | INTRAVENOUS | Status: AC
  Administered 2021-02-25: 5 mg via INTRAVENOUS
  Filled 2021-02-25: qty 5

## 2021-02-25 MED ORDER — SUCRALFATE 1 G PO TABS
1.0000 g | ORAL_TABLET | Freq: Three times a day (TID) | ORAL | Status: DC
  Administered 2021-02-25 – 2021-03-04 (×27): 1 g via ORAL
  Filled 2021-02-25 (×27): qty 1

## 2021-02-25 MED ORDER — DILTIAZEM HCL-DEXTROSE 125-5 MG/125ML-% IV SOLN (PREMIX): 5.0000 mg/h | INTRAVENOUS | Status: DC

## 2021-02-25 MED ORDER — DILTIAZEM HCL-DEXTROSE 125-5 MG/125ML-% IV SOLN (PREMIX)
5.0000 mg/h | INTRAVENOUS | Status: DC
  Administered 2021-02-25: 5 mg/h via INTRAVENOUS
  Administered 2021-02-25 – 2021-02-26 (×2): 15 mg/h via INTRAVENOUS
  Administered 2021-02-26: 10 mg/h via INTRAVENOUS
  Administered 2021-02-26 – 2021-02-27 (×3): 15 mg/h via INTRAVENOUS
  Administered 2021-02-28: 12.5 mg/h via INTRAVENOUS
  Administered 2021-02-28 – 2021-03-01 (×2): 5 mg/h via INTRAVENOUS
  Filled 2021-02-25 (×12): qty 125

## 2021-02-25 MED ORDER — ONDANSETRON HCL 4 MG/2ML IJ SOLN
4.0000 mg | Freq: Four times a day (QID) | INTRAMUSCULAR | Status: DC | PRN
  Administered 2021-02-26 – 2021-02-28 (×3): 4 mg via INTRAVENOUS
  Filled 2021-02-25 (×3): qty 2

## 2021-02-25 MED ORDER — DIGOXIN 0.25 MG/ML IJ SOLN
0.1250 mg | Freq: Once | INTRAMUSCULAR | Status: AC
  Administered 2021-02-25: 0.125 mg via INTRAVENOUS
  Filled 2021-02-25: qty 2

## 2021-02-25 MED ORDER — FENTANYL CITRATE PF 50 MCG/ML IJ SOSY
12.5000 ug | PREFILLED_SYRINGE | INTRAMUSCULAR | Status: DC | PRN
  Filled 2021-02-25: qty 0.25

## 2021-02-25 MED ORDER — DILTIAZEM HCL 25 MG/5ML IV SOLN
5.0000 mg | Freq: Once | INTRAVENOUS | Status: AC
  Administered 2021-02-25: 5 mg via INTRAVENOUS
  Filled 2021-02-25: qty 5

## 2021-02-25 MED ORDER — SODIUM CHLORIDE 0.9% IV SOLUTION: Freq: Once | INTRAVENOUS | Status: DC

## 2021-02-25 MED ORDER — METOPROLOL TARTRATE 5 MG/5ML IV SOLN
5.0000 mg | INTRAVENOUS | Status: DC | PRN
  Administered 2021-02-25 – 2021-02-27 (×3): 5 mg via INTRAVENOUS
  Filled 2021-02-25 (×6): qty 5

## 2021-02-25 MED ORDER — SODIUM CHLORIDE 0.9 % IR SOLN
3000.0000 mL | Status: DC
  Administered 2021-02-25 – 2021-02-28 (×9): 3000 mL

## 2021-02-25 MED ORDER — LORAZEPAM 2 MG/ML IJ SOLN
1.0000 mg | Freq: Once | INTRAMUSCULAR | Status: AC
  Administered 2021-02-25: 1 mg via INTRAVENOUS
  Filled 2021-02-25: qty 1

## 2021-02-25 NOTE — Progress Notes (Signed)
Urology Inpatient Progress Report  AKI (acute kidney injury) ([place]) [N17.9] Sepsis ([facility]) [A41.9] Anemia, unspecified type [D64.9] Sepsis with acute renal failure without septic shock, due to unspecified organism, unspecified acute renal failure type ([place]) [A41.9, R65.20, N17.9]     Intv/Subj: The patient developed A. fib with RVR overnight.  He was largely unresponsive to the IV metoprolol.  This morning he has been placed on a diltiazem drip.  The patient developed gross hematuria late yesterday afternoon, and subsequently developed clot retention.  Per the patient and his daytime nurse, the Foley catheter was exchanged, which also occluded.  This morning the patient's urine is thick maroon color but appears to be draining.  The patient not complaining of any bladder discomfort, but has had intermittent bladder spasm.  Principal Problem:   Severe Sepsis secondary to UTI [facility]) Active Problems:   Coronary artery disease involving native coronary artery of native heart without angina pectoris   Atrial fibrillation s/p PVI ablation at [facility] 2001   Obstructive sleep apnea syndrome   Cirrhosis of liver without ascites ([place])   Type 2 diabetes mellitus without complications ([place])   AKI (acute kidney injury) ([place])   Anemia   Thrombocytopenia ([facility])  Current Facility-Administered Medications  Medication Dose Route Frequency Provider Last Rate Last Admin   0.9 %  sodium chloride infusion (Manually program via Guardrails IV Fluids)   Intravenous Once Gala Romney L, MD       0.9 %  sodium chloride infusion (Manually program via Guardrails IV Fluids)   Intravenous Once Arrien, Jimmy Picket, MD       0.9 %  sodium chloride infusion   Intravenous PRN Elwyn Reach, MD   Stopped at 02/24/21 1342   0.9 %  sodium chloride infusion   Intravenous Continuous Arrien, Jimmy Picket, MD 100 mL/hr at 02/25/21 0512 New Bag at 02/25/21 0512   baclofen (LIORESAL) tablet 10 mg  10 mg Oral TID Elwyn Reach, MD   10 mg at 02/25/21 0850   buPROPion (WELLBUTRIN XL) 24 hr tablet 300 mg  300 mg Oral Daily Gala Romney L, MD   300 mg at 02/25/21 0850   Chlorhexidine Gluconate Cloth 2 % PADS 6 each  6 each Topical Daily Elwyn Reach, MD   6 each at 02/24/21 1341   diltiazem (CARDIZEM) 125 mg in dextrose 5% 125 mL (1 mg/mL) infusion  5-15 mg/hr Intravenous Titrated Lovey Newcomer T, NP 10 mL/hr at 02/25/21 0638 10 mg/hr at 02/25/21 7564   feeding supplement (ENSURE ENLIVE / ENSURE PLUS) liquid 237 mL  237 mL Oral Q24H Arrien, Jimmy Picket, MD       fentaNYL (SUBLIMAZE) injection 12.5 mcg  12.5 mcg Intravenous Q2H PRN Arrien, Jimmy Picket, MD       gabapentin (NEURONTIN) capsule 600 mg  600 mg Oral TID Gala Romney L, MD   600 mg at 02/25/21 0850   insulin aspart (novoLOG) injection 0-15 Units  0-15 Units Subcutaneous TID WC Elwyn Reach, MD   3 Units at 02/25/21 0849   insulin aspart (novoLOG) injection 0-5 Units  0-5 Units Subcutaneous QHS Gala Romney L, MD   2 Units at 02/23/21 2355   meropenem (MERREM) 1 g in sodium chloride 0.9 % 100 mL IVPB  1 g Intravenous Q12H Arrien, Jimmy Picket, MD 200 mL/hr at 02/25/21 1017 1 g at 02/25/21 1017   metoprolol tartrate (LOPRESSOR) injection 5 mg  5 mg Intravenous Q4H PRN Arrien, Jimmy Picket, MD  multivitamin with minerals tablet 1 tablet  1 tablet Oral Daily Arrien, Jimmy Picket, MD   1 tablet at 02/25/21 0850   niacin tablet 500 mg  500 mg Oral Daily Elwyn Reach, MD       nitroGLYCERIN (NITROSTAT) SL tablet 0.4 mg  0.4 mg Sublingual Q5 min PRN Elwyn Reach, MD       ondansetron (ZOFRAN) tablet 4 mg  4 mg Oral Q6H PRN Elwyn Reach, MD       Or   ondansetron (ZOFRAN) injection 4 mg  4 mg Intravenous Q6H PRN Gala Romney L, MD   4 mg at 02/25/21 0850   pantoprazole (PROTONIX) EC tablet 40 mg  40 mg Oral BID Gala Romney L, MD   40 mg at 02/25/21 0850   promethazine (PHENERGAN) 12.5 mg in sodium  chloride 0.9 % 50 mL IVPB  12.5 mg Intravenous Q6H PRN Tawni Millers, MD 200 mL/hr at 02/24/21 1421 12.5 mg at 02/24/21 1421   rOPINIRole (REQUIP) tablet 1 mg  1 mg Oral QHS Gala Romney L, MD   1 mg at 02/24/21 2205   rosuvastatin (CRESTOR) tablet 5 mg  5 mg Oral QPM Gala Romney L, MD       tamsulosin (FLOMAX) capsule 0.4 mg  0.4 mg Oral QPM Jonelle Sidle, Mohammad L, MD   0.4 mg at 02/24/21 1807   traMADol (ULTRAM) tablet 100 mg  100 mg Oral Q6H PRN [name], MD   100 mg at 02/25/21 0850     Objective: Vital: Vitals:   02/25/21 0600 02/25/21 0630 02/25/21 0746 02/25/21 0800  BP: 115/70 112/60 (!) 85/74 92/62  Pulse:  (!) 170 (!) 169 (!) 166  Resp:   10   Temp:   (!) 97.4 F (36.3 C)   TempSrc:   Oral   SpO2:      Weight:      Height:       I/Os: I/O last 3 completed shifts: In: 6801.6 [I.V.:3195; Blood:778.3; IV Piggyback:2828.3] Out: 2625 [Urine:2250; Emesis/NG output:375]  Physical Exam:  General: Patient is in no apparent distress Lungs: Normal respiratory effort, chest expands symmetrically. GI: Abdomen is soft and nontender Foley: Currently draining dark maroon-colored urine with some clots in the bag Ext: lower extremities symmetric  Lab Results: Recent Labs    02/23/21 1605 02/24/21 0501 02/25/21 0332  WBC 9.0 7.8 4.2  HGB 6.8* 8.6* 7.6*  HCT 20.1* 24.5* 22.2*   Recent Labs    02/23/21 1605 02/24/21 0501 02/25/21 0332  NA 136 136 128*  K 4.1 4.3 3.6  CL 101 101 96*  CO2 24 26 21*  GLUCOSE 235* 228* 177*  BUN 35* 35* 35*  CREATININE 2.11* 1.99* 1.59*  CALCIUM 8.1* 8.0* 7.1*   Recent Labs    02/23/21 1605 02/24/21 0501  INR 1.2 1.2   No results for input(s): LABURIN in the last 72 hours. Results for orders placed or performed during the hospital encounter of 02/23/21  Blood Culture (routine x 2)     Status: Abnormal (Preliminary result)   Collection Time: 02/23/21  4:05 PM   Specimen: BLOOD RIGHT FOREARM  Result Value Ref  Range Status   Specimen Description   Final    BLOOD RIGHT FOREARM Performed at [facility] [address] [place], [place] [address]    Special Requests   Final    BOTTLES DRAWN AEROBIC AND ANAEROBIC Blood Culture adequate volume Performed at [facility], [place] [address] [place], [place] [address]    Culture  Setup Time   Final    GRAM NEGATIVE RODS IN BOTH AEROBIC AND ANAEROBIC BOTTLES CRITICAL RESULT CALLED TO, READ BACK BY AND VERIFIED WITH: PHARMD M BELL [id number] AT 83 AM BY CM    Culture (A)  Final    KLEBSIELLA PNEUMONIAE SUSCEPTIBILITIES TO FOLLOW Performed at [facility], [place] [address] [place], [place] [address]    Report Status PENDING  Incomplete  Blood Culture ID Panel (Reflexed)     Status: Abnormal (Preliminary result)   Collection Time: 02/23/21  4:05 PM  Result Value Ref Range Status   Enterococcus faecalis NOT DETECTED NOT DETECTED Final   Enterococcus Faecium NOT DETECTED NOT DETECTED Final   Listeria monocytogenes NOT DETECTED NOT DETECTED Final   Staphylococcus species NOT DETECTED NOT DETECTED Final   Staphylococcus aureus (BCID) NOT DETECTED NOT DETECTED Final   Staphylococcus epidermidis NOT DETECTED NOT DETECTED Final   Staphylococcus lugdunensis NOT DETECTED NOT DETECTED Final   Streptococcus species NOT DETECTED NOT DETECTED Final   Streptococcus agalactiae NOT DETECTED NOT DETECTED Final   Streptococcus pneumoniae NOT DETECTED NOT DETECTED Final   Streptococcus pyogenes NOT DETECTED NOT DETECTED Final   A.calcoaceticus-baumannii NOT DETECTED NOT DETECTED Final   Bacteroides fragilis NOT DETECTED NOT DETECTED Final   Enterobacterales PENDING NOT DETECTED Incomplete   Enterobacter cloacae complex NOT DETECTED NOT DETECTED Final   Escherichia coli NOT DETECTED NOT DETECTED Final   Klebsiella aerogenes NOT DETECTED NOT DETECTED Final   Klebsiella oxytoca NOT DETECTED NOT DETECTED Final   Klebsiella  pneumoniae DETECTED (A) NOT DETECTED Final    Comment: CRITICAL RESULT CALLED TO, READ BACK BY AND VERIFIED WITH: PHARMD M BELL [id number] AT 907 AM BY CM    Proteus species NOT DETECTED NOT DETECTED Final   Salmonella species NOT DETECTED NOT DETECTED Final   Serratia marcescens NOT DETECTED NOT DETECTED Final   Haemophilus influenzae NOT DETECTED NOT DETECTED Final   Neisseria meningitidis NOT DETECTED NOT DETECTED Final   Pseudomonas aeruginosa NOT DETECTED NOT DETECTED Final   Stenotrophomonas maltophilia NOT DETECTED NOT DETECTED Final   Candida albicans NOT DETECTED NOT DETECTED Final   Candida auris NOT DETECTED NOT DETECTED Final   Candida glabrata NOT DETECTED NOT DETECTED Final   Candida krusei NOT DETECTED NOT DETECTED Final   Candida parapsilosis NOT DETECTED NOT DETECTED Final   Candida tropicalis NOT DETECTED NOT DETECTED Final   Cryptococcus neoformans/gattii NOT DETECTED NOT DETECTED Final   CTX-M ESBL DETECTED (A) NOT DETECTED Final    Comment: CRITICAL RESULT CALLED TO, READ BACK BY AND VERIFIED WITH: PHARMD M BELL [id number] AT 907 AM BY CM (NOTE) Extended spectrum beta-lactamase detected. Recommend a carbapenem as initial therapy.      Carbapenem resistance IMP NOT DETECTED NOT DETECTED Final   Carbapenem resistance KPC NOT DETECTED NOT DETECTED Final   Carbapenem resistance NDM NOT DETECTED NOT DETECTED Final   Carbapenem resist OXA 48 LIKE NOT DETECTED NOT DETECTED Final   Carbapenem resistance VIM NOT DETECTED NOT DETECTED Final    Comment: Performed at [facility], [place] [address], [place], [place] [address]  Blood Culture (routine x 2)     Status: None (Preliminary result)   Collection Time: 02/23/21  4:24 PM   Specimen: BLOOD  Result Value Ref Range Status   Specimen Description   Final    BLOOD LEFT ANTECUBITAL Performed at [facility],  [place] [address] [place], [place] [address]    Special Requests   Final    BOTTLES DRAWN AEROBIC  AND ANAEROBIC Blood Culture adequate volume Performed at [facility] [address], [place], [place] [address]    Culture  Setup Time   Final    GRAM NEGATIVE RODS AEROBIC BOTTLE ONLY CRITICAL VALUE NOTED.  VALUE IS CONSISTENT WITH PREVIOUSLY REPORTED AND CALLED VALUE.    Culture   Final    GRAM NEGATIVE RODS IDENTIFICATION TO FOLLOW Performed at [facility], [place] [address] [place], [place] [address]    Report Status PENDING  Incomplete  Resp Panel by RT-PCR (Flu A&B, Covid) Nasopharyngeal Swab     Status: None   Collection Time: 02/23/21  4:44 PM   Specimen: Nasopharyngeal Swab; Nasopharyngeal(NP) swabs in vial transport medium  Result Value Ref Range Status   SARS Coronavirus 2 by RT PCR NEGATIVE NEGATIVE Final    Comment: (NOTE) SARS-CoV-2 target nucleic acids are NOT DETECTED.  The SARS-CoV-2 RNA is generally detectable in upper respiratory specimens during the acute phase of infection. The lowest concentration of SARS-CoV-2 viral copies this assay can detect is 138 copies/mL. A negative result does not preclude SARS-Cov-2 infection and should not be used as the sole basis for treatment or other patient management decisions. A negative result may occur with  improper specimen collection/handling, submission of specimen other than nasopharyngeal swab, presence of viral mutation(s) within the areas targeted by this assay, and inadequate number of viral copies(<138 copies/mL). A negative result must be combined with clinical observations, patient history, and epidemiological information. The expected result is Negative.  Fact Sheet for Patients:  EntrepreneurPulse.com.au  Fact Sheet for Healthcare Providers:  IncredibleEmployment.be  This test is no t yet approved or cleared by the [place] FDA and  has been authorized for detection and/or diagnosis of SARS-CoV-2 by FDA under an Emergency Use  Authorization (EUA). This EUA will remain  in effect (meaning this test can be used) for the duration of the COVID-19 declaration under Section 564(b)(1) of the Act, 21 U.S.C.section 360bbb-3(b)(1), unless the authorization is terminated  or revoked sooner.       Influenza A by PCR NEGATIVE NEGATIVE Final   Influenza B by PCR NEGATIVE NEGATIVE Final    Comment: (NOTE) The Xpert Xpress SARS-CoV-2/FLU/RSV plus assay is intended as an aid in the diagnosis of influenza from Nasopharyngeal swab specimens and should not be used as a sole basis for treatment. Nasal washings and aspirates are unacceptable for Xpert Xpress SARS-CoV-2/FLU/RSV testing.  Fact Sheet for Patients: EntrepreneurPulse.com.au  Fact Sheet for Healthcare Providers: IncredibleEmployment.be  This test is not yet approved or cleared by the [place] FDA and has been authorized for detection and/or diagnosis of SARS-CoV-2 by FDA under an Emergency Use Authorization (EUA). This EUA will remain in effect (meaning this test can be used) for the duration of the COVID-19 declaration under Section 564(b)(1) of the Act, 21 U.S.C. section 360bbb-3(b)(1), unless the authorization is terminated or revoked.  Performed at [facility], [place] [address] [place], [place] [address]   Urine Culture     Status: Abnormal (Preliminary result)   Collection Time: 02/23/21  4:54 PM   Specimen: In/Out Cath Urine  Result Value Ref Range Status   Specimen Description   Final    IN/OUT CATH URINE Performed at [facility] [address], [place], [place] [address]    Special Requests  Final    NONE Performed at [facility], [place] [address], [place], [place] [address]    Culture 30,000 COLONIES/mL GRAM NEGATIVE RODS (A)  Final   Report Status PENDING  Incomplete    Studies/Results: CT ABDOMEN PELVIS WO CONTRAST  Result Date:  02/23/2021 CLINICAL DATA:  Abdominal pain, fever. EXAM: CT ABDOMEN AND PELVIS WITHOUT CONTRAST TECHNIQUE: Multidetector CT imaging of the abdomen and pelvis was performed following the standard protocol without IV contrast. COMPARISON:  02/20/2021 FINDINGS: Lower chest: Stable moderate pericardial effusion. Low-density blood pool suggesting anemia. Coronary and descending thoracic aortic atherosclerotic calcification. Dependent subsegmental atelectasis in both lower lobes. Hepatobiliary: Unremarkable Pancreas: Unremarkable Spleen: Unremarkable Adrenals/Urinary Tract: Both adrenal glands appear normal. Foley catheter noted in the urinary bladder. There is a left double-J ureteral stent with proximal and distal loops formed in the renal pelvis and urinary bladder, respectively. Continued accentuated density in the left renal collecting system, density up to about 59 Hounsfield units, formerly 61 Hounsfield units. Decreased to scree of distention of the left renal collecting system compared to previous. No substantial hydroureter. Left perirenal stranding with some mild high density components as on the prior exam, although nodularity along the posterior perirenal fascia is reduced. These likely represent blood products. Stomach/Bowel: There are few mildly dilated loops of small bowel in the left upper quadrant, without an obvious transition. Formed stool in the colon. Mild sigmoid colon diverticulosis. Vascular/Lymphatic: Atherosclerosis is present, including aortoiliac atherosclerotic disease. No pathologic adenopathy identified. Reproductive: Unremarkable Other: No supplemental non-categorized findings. Musculoskeletal: Mild bilateral degenerative hip arthropathy. Lower lumbar spondylosis. Rim calcified synovial cyst posterior to the left L5-S1 facet joint, not in a position to cause impingement. Transitional S1 vertebra. IMPRESSION: 1. Reduced dilation of the left renal collecting system although with some  persistent high density material in the collecting system especially in the lower pole likely from blood products. 2. Reduced high-density nodularity along the perirenal space posteriorly, likely from clot retraction/early resorption. 3. Left double-J ureteral stent remains in place. No appreciable hydroureter despite the mild prominence of the lower pole collecting system. 4. Stable moderate pericardial effusion. 5. Low-density blood pool suggests anemia. 6.  Aortic Atherosclerosis (ICD10-I70.0).  Coronary atherosclerosis. 7. Several mildly dilated loops of small bowel in the left upper quadrant, query ileus or mild enteritis. No obvious transition to indicate obstruction. Electronically Signed   By: Van Clines M.D.   On: 02/23/2021 18:07   CT HEAD WO CONTRAST (5MM)  Result Date: 02/23/2021 CLINICAL DATA:  Head trauma, minor EXAM: CT HEAD WITHOUT CONTRAST TECHNIQUE: Contiguous axial images were obtained from the base of the skull through the vertex without intravenous contrast. COMPARISON:  2015 FINDINGS: Brain: There is no acute intracranial hemorrhage, mass effect, or edema. Gray-white differentiation is preserved. There is no extra-axial fluid collection. Ventricles and sulci are within normal limits in size and configuration. Patchy low-attenuation in the supratentorial white matter is nonspecific but may reflect mild chronic microvascular ischemic changes. Vascular: There is atherosclerotic calcification at the skull base. Skull: Calvarium is unremarkable. Sinuses/Orbits: No acute finding. Other: None. IMPRESSION: No acute intracranial abnormality. Electronically Signed   By: Macy Mis M.D.   On: 02/23/2021 18:00   DG Chest Port 1 View  Result Date: 02/23/2021 CLINICAL DATA:  Weakness decreased mobility EXAM: PORTABLE CHEST 1 VIEW COMPARISON:  09/18/2019 FINDINGS: Electronic recording device over left chest. No focal opacity or pleural effusion. Normal cardiomediastinal silhouette. No  pneumothorax. IMPRESSION: No active disease. Electronically Signed  By: Donavan Foil M.D.   On: 02/23/2021 17:08    Assessment: 70 year old status post left PCNL complicated by ongoing gross hematuria and subsequent urinary retention which then led to complicated UTI and urosepsis.  He has developed anemia from acute surgical blood loss, and he has required 1 unit of packed red blood cells upon admission and is scheduled to receive another.  Due to his anemia and urosepsis he reverted into atrial fibrillation with RVR, which is now currently being addressed with diltiazem drip.  His hematuria has been persistent, although he does not appear to be briskly bleeding.  I suspect that he has a large organized clot in his bladder that is breaking down over time, and then a lot of the bleeding is related to that.  I do not think he has ongoing upper tract bleeding.  Plan: A 24 French three-way Foley catheter was placed in the patient's bladder and I attempted to irrigate it.  I was unable to get any clots.  We did put him on CBI to prevent any additional clots from developing.  We will keep a close eye on his CBI over the next 24 hours and see if we can wean it off.  The patient is receiving blood and remains on a diltiazem drip.  I suspect with more volume his A. fib will be easier to control.  The patient's infection is being managed with meropenem currently.  Will transition to culture specific antibiotics when able.  We will continue to follow.  Appreciate the hospitalist assistance with this postop patient.   Louis Meckel, MD Urology 02/25/2021, 10:27 AM

## 2021-02-25 NOTE — Progress Notes (Addendum)
Call received from [facility] at [place] stating pt had flipped into Atrial Fibrillation and the heart rate had reached 190, but did not stay, and was staying above 130. MD notified and new orders placed for pt. Will continue to monitor.

## 2021-02-25 NOTE — Progress Notes (Signed)
Per Control and instrumentation engineer, RN notified that wife would like to be notified with any change of condition in the patient.

## 2021-02-25 NOTE — Progress Notes (Signed)
PROGRESS NOTE    Kristopher [name]  ERX:[id number] DOB: 1950/07/25 DOA: 02/23/2021 PCP: Townsend Roger, MD    Brief Narrative:  [name] was admitted to the hospital working diagnosis of severe sepsis due to urinary tract infection, Klebsiella bacteremia, ESBL,   70 year old male past medical history for atrial fibrillation, coronary artery disease, nonalcoholic cirrhosis, dyslipidemia, type 2 diabetes mellitus, and urolithiasis who presented with fevers, chills and hematuria. 8/26 patient underwent cystoscopy, left retrograde pyelogram with interpretation, left percutaneous renal access, left nephrolithotomy, left nephrostogram, left ureteral stent. Postprocedure he had a Foley catheter placed for urinary retention. On 9/3 patient returned to the ED because urinary retention.  Apparently his Foley catheter was blocked by blood clot at home and he removed it himself.  Foley catheter was replaced the patient was discharged home. Patient continued to have fevers, chills along with back pain at home that prompted him to come back to the hospital.  On his initial physical examination his temperature was 103 F, blood pressure 87/59, heart rate 155, respiratory 28, oxygen saturation 72% on room air.  He was awake and alert, dry mucous membranes, lungs clear to auscultation bilaterally, heart S1-S2, tachycardic, abdomen soft nontender.   Sodium 136, potassium 4.1, chloride 101, bicarb 24, glucose 235, BUN 35, creatinine 2.1, AST 113, ALT 27, high sensitive troponin 39, lactic acid 1.2, white count 9.0, hemoglobin 6.8, hematocrit 20.1, platelets 125. SARS COVID-19 negative.   Urinalysis > 50 white cells, > 50 red cells.  Brown color, turbid.   CT of the abdomen pelvis left double-J ureteral stent in place.  No hydroureter.  Several mildly dilated loops of small bowel in the upper quadrant, no obvious transition to indicate obstruction.   Chest radiograph no infiltrates.   EKG 107 bpm, normal axis, normal  intervals, sinus rhythm, no significant ST segment changes, negative T waves V4-V6.  Patient was placed on antibiotic therapy and IV fluids.  Culture positive for multidrug resistant Klebsiella.  09/09 patient developed atrial fibrillation with RVR   Assessment & Plan:   Principal Problem:   Severe Sepsis secondary to UTI [facility]) Active Problems:   Coronary artery disease involving native coronary artery of native heart without angina pectoris   Atrial fibrillation s/p PVI ablation at [facility] 2001   Obstructive sleep apnea syndrome   Cirrhosis of liver without ascites ([name])   Type 2 diabetes mellitus without complications (HCC)   AKI (acute kidney injury) ([place])   Anemia   Thrombocytopenia (HCC)   Severe sepsis due to urinary tract infection (complicated) in the setting of indwelling foley catheter. (Gram negative bacteremia) Wbc is 4,2. He has been afebrile. Continue to have moderate right side pain.  Urine culture and blood culture positive for Klebsiella ESBL. CT with no evident abscess.    Antibiotic therapy with meropenem.  Will continue with IV fluids, hold during PRBC transfusion.    2. Acute blood loss anemia due to hematuria. thrombocytopenia Sp 1 units PRBC transfusion.  Hgb 7,6 with Hct at 22.2, patient with persistent hematuria   Worsening anemia will plan to transfuse 1 more unit today.  Patient with hematuria, changed catheter this am per Urology to a larger caliber and placed on continuous bladder irrigation.  Continue close H&H monitoring.    Case discussed with Dr Louis Meckel from urology at the bedside.    3. AKI hyponatremia. Renal function with serum ct at 1,59 with K at 3,6 and Na down to 128.   Continue with isotonic volume  repletion, at 75 ml per Hr, will hold IV fluids while transfusing PRBC., Foley catheter has been placed for urinary retention. Avoid hypotension and nephrotoxic medications.    4. Atrial fibrillation sp ablation.  Patient has developed  atrial fibrillation with rapid ventricular response. Had digoxin and placed on diltiazem drip. At at the time of my examination HR in the 170 atrial fibrillation rhythm.  Patient with acute urinary retention, positive back pain and worsening anemia, along with sepsis, making atrial fibrillation difficult to control.  Plan to continue diltiazem drip and add metoprolol IV as needed. Continue pain control and transfuse one unit PRBC.  Antibiotic therapy for sepsis. If continue uncontrolled patient will need amiodarone drip.     5. T2DM/ dyslipidemia. Fasting glucose this am 177. Glucose cover and monitoring with insulin sliding scale rosuvastatin.    Patient continue to be at high risk for worsening sepsis and atrial fibrillation   Status is: Inpatient  Remains inpatient appropriate because:IV treatments appropriate due to intensity of illness or inability to take PO  Dispo: The patient is from: Home              Anticipated d/c is to: Home              Patient currently is not medically stable to d/c.   Difficult to place patient No   DVT prophylaxis: Scd   Code Status:   full  Family Communication:  I spoke with patient's wife at the bedside, we talked in detail about patient's condition, plan of care and prognosis and all questions were addressed.      Nutrition Status: Nutrition Problem: Increased nutrient needs Etiology: acute illness (sepsis) Signs/Symptoms: estimated needs Interventions: Ensure Enlive (each supplement provides 350kcal and 20 grams of protein), MVI     Consultants:  Urology    Antimicrobials:  meropenem    Subjective: Patient with positive nausea and back pain, no chest pain, positive palpitations.   Objective: Vitals:   02/25/21 0600 02/25/21 0630 02/25/21 0746 02/25/21 0800  BP: 115/70 112/60 (!) 85/74 92/62  Pulse:  (!) 170 (!) 169 (!) 166  Resp:   10   Temp:   (!) 97.4 F (36.3 C)   TempSrc:   Oral   SpO2:      Weight:       Height:        Intake/Output Summary (Last 24 hours) at 02/25/2021 0914 Last data filed at 02/25/2021 8588 Gross per 24 hour  Intake 2605.92 ml  Output 1975 ml  Net 630.92 ml   Filed Weights   02/23/21 1541  Weight: 72.5 kg    Examination:   General: Not in pain or dyspnea, deconditioned and ill looking appearing  Neurology: Awake and alert, non focal  E ENT: positive pallor, no icterus, oral mucosa moist Cardiovascular: No JVD. S1-S2 present, tachycardic irregularly irregular with no gallops, rubs, or murmurs. No lower extremity edema. Pulmonary: vesicular breath sounds bilaterally, adequate air movement, no wheezing, rhonchi or rales. Gastrointestinal. Abdomen soft, mild distended Skin. No rashes Musculoskeletal: no joint deformities Urinary cathter with positive hematuria.     Data Reviewed: I have personally reviewed following labs and imaging studies  CBC: Recent Labs  Lab 02/19/21 2324 02/23/21 1605 02/24/21 0501 02/25/21 0332  WBC 6.4 9.0 7.8 4.2  NEUTROABS  --  8.4*  --  3.8  HGB 9.0* 6.8* 8.6* 7.6*  HCT 26.4* 20.1* 24.5* 22.2*  MCV 93.6 95.3 90.4 91.0  PLT 181 125* 111*  93*   Basic Metabolic Panel: Recent Labs  Lab 02/19/21 2324 02/23/21 1605 02/24/21 0501 02/25/21 0332  NA 134* 136 136 128*  K 3.7 4.1 4.3 3.6  CL 99 101 101 96*  CO2 24 24 26  21*  GLUCOSE 254* 235* 228* 177*  BUN 16 35* 35* 35*  CREATININE 1.21 2.11* 1.99* 1.59*  CALCIUM 8.9 8.1* 8.0* 7.1*   GFR: Estimated Creatinine Clearance: 41.6 mL/min (A) (by C-G formula based on SCr of 1.59 mg/dL (H)). Liver Function Tests: Recent Labs  Lab 02/19/21 2324 02/23/21 1605 02/24/21 0501  AST 15 113* 174*  ALT 12 27 47*  ALKPHOS 62 65 62  BILITOT 0.8 1.4* 1.4*  PROT 6.5 6.1* 5.9*  ALBUMIN 3.9 3.3* 2.8*   No results for input(s): LIPASE, AMYLASE in the last 168 hours. No results for input(s): AMMONIA in the last 168 hours. Coagulation Profile: Recent Labs  Lab 02/23/21 1605  02/24/21 0501  INR 1.2 1.2   Cardiac Enzymes: No results for input(s): CKTOTAL, CKMB, CKMBINDEX, TROPONINI in the last 168 hours. BNP (last 3 results) No results for input(s): PROBNP in the last 8760 hours. HbA1C: No results for input(s): HGBA1C in the last 72 hours. CBG: Recent Labs  Lab 02/24/21 0746 02/24/21 1143 02/24/21 1736 02/24/21 2130 02/25/21 0830  GLUCAP 166* 138* 134* 141* 162*   Lipid Profile: No results for input(s): CHOL, HDL, LDLCALC, TRIG, CHOLHDL, LDLDIRECT in the last 72 hours. Thyroid Function Tests: No results for input(s): TSH, T4TOTAL, FREET4, T3FREE, THYROIDAB in the last 72 hours. Anemia Panel: No results for input(s): VITAMINB12, FOLATE, FERRITIN, TIBC, IRON, RETICCTPCT in the last 72 hours.    Radiology Studies: I have reviewed all of the imaging during this hospital visit personally     Scheduled Meds:  sodium chloride   Intravenous Once   baclofen  10 mg Oral TID   buPROPion  300 mg Oral Daily   Chlorhexidine Gluconate Cloth  6 each Topical Daily   feeding supplement  237 mL Oral Q24H   gabapentin  600 mg Oral TID   insulin aspart  0-15 Units Subcutaneous TID WC   insulin aspart  0-5 Units Subcutaneous QHS   multivitamin with minerals  1 tablet Oral Daily   niacin  500 mg Oral Daily   pantoprazole  40 mg Oral BID   rOPINIRole  1 mg Oral QHS   rosuvastatin  5 mg Oral QPM   tamsulosin  0.4 mg Oral QPM   Continuous Infusions:  sodium chloride Stopped (02/24/21 1342)   sodium chloride 100 mL/hr at 02/25/21 0512   diltiazem (CARDIZEM) infusion 10 mg/hr (02/25/21 5465)   meropenem (MERREM) IV 1 g (02/24/21 2305)   promethazine (PHENERGAN) injection (IM or IVPB) 12.5 mg (02/24/21 1421)     LOS: 2 days        Keyle Doby Gerome Apley, MD

## 2021-02-25 NOTE — Progress Notes (Signed)
Rapid Response Event Note   Reason for Call :  Rapid afib in 170-190s  Initial Focused Assessment:  AxOx4, asymptomatic to afib. PMH of Afib on plavix at home. BPs stable 272Z-366 systolic.    Interventions:  75m lopressor push, 559mcardizem push, and 0.25 mg Digoxin push - all ineffective in bringing HR down  Plan of Care:  Pt will need continuous gtt if HR continues to stay high. 4W can manage Cardizem & Amio gtts. This RN will continue to monitor remotely   Event Summary:  Floor RN in contact w/ NP on call. NP put in orders for continuous Cardizem  MD Notified: X. Blount, NP Call Time: 04[place] Summitime: [id number] Time: 05[place]

## 2021-02-25 NOTE — Progress Notes (Signed)
MD paged due to pts HR is stays reading 140-150s, jumping to 160s. All other VS stable.

## 2021-02-25 NOTE — Plan of Care (Signed)

## 2021-02-26 ENCOUNTER — Inpatient Hospital Stay (HOSPITAL_COMMUNITY): Payer: Medicare HMO

## 2021-02-26 LAB — CULTURE, BLOOD (ROUTINE X 2)
Special Requests: ADEQUATE
Special Requests: ADEQUATE

## 2021-02-26 LAB — CREATININE, SERUM
Creatinine, Ser: 1.87 mg/dL — ABNORMAL HIGH (ref 0.61–1.24)
GFR, Estimated: 39 mL/min — ABNORMAL LOW (ref >60)

## 2021-02-26 LAB — BASIC METABOLIC PANEL
Anion gap: 14 (ref 5–15)
BUN: 40 mg/dL — ABNORMAL HIGH (ref 8–23)
CO2: 16 mmol/L — ABNORMAL LOW (ref 22–32)
Calcium: 7.6 mg/dL — ABNORMAL LOW (ref 8.9–10.3)
Chloride: 102 mmol/L (ref 98–111)
Creatinine, Ser: 1.93 mg/dL — ABNORMAL HIGH (ref 0.61–1.24)
GFR, Estimated: 37 mL/min — ABNORMAL LOW (ref >60)
Glucose, Bld: 203 mg/dL — ABNORMAL HIGH (ref 70–99)
Potassium: 3.4 mmol/L — ABNORMAL LOW (ref 3.5–5.1)
Sodium: 132 mmol/L — ABNORMAL LOW (ref 135–145)

## 2021-02-26 LAB — BLOOD GAS, ARTERIAL
Acid-base deficit: 9.5 mmol/L — ABNORMAL HIGH (ref 0.0–2.0)
Bicarbonate: 14.1 mmol/L — ABNORMAL LOW (ref 20.0–28.0)
Drawn by: 270211
O2 Saturation: 99 %
Patient temperature: 98.6
pCO2 arterial: 24.4 mmHg — ABNORMAL LOW (ref 32.0–48.0)
pH, Arterial: 7.381 (ref 7.350–7.450)
pO2, Arterial: 159 mmHg — ABNORMAL HIGH (ref 83.0–108.0)

## 2021-02-26 LAB — GLUCOSE, CAPILLARY
Glucose-Capillary: 218 mg/dL — ABNORMAL HIGH (ref 70–99)
Glucose-Capillary: 157 mg/dL — ABNORMAL HIGH (ref 70–99)
Glucose-Capillary: 179 mg/dL — ABNORMAL HIGH (ref 70–99)
Glucose-Capillary: 212 mg/dL — ABNORMAL HIGH (ref 70–99)
Glucose-Capillary: 214 mg/dL — ABNORMAL HIGH (ref 70–99)
Glucose-Capillary: 241 mg/dL — ABNORMAL HIGH (ref 70–99)

## 2021-02-26 LAB — AMMONIA: Ammonia: 25 umol/L (ref 9–35)

## 2021-02-26 LAB — CBC
HCT: 27.6 % — ABNORMAL LOW (ref 39.0–52.0)
Hemoglobin: 9.4 g/dL — ABNORMAL LOW (ref 13.0–17.0)
MCH: 30.4 pg (ref 26.0–34.0)
MCHC: 34.1 g/dL (ref 30.0–36.0)
MCV: 89.3 fL (ref 80.0–100.0)
Platelets: 139 10*3/uL — ABNORMAL LOW (ref 150–400)
RBC: 3.09 MIL/uL — ABNORMAL LOW (ref 4.22–5.81)
RDW: 17.2 % — ABNORMAL HIGH (ref 11.5–15.5)
WBC: 8.4 10*3/uL (ref 4.0–10.5)
nRBC: 0 % (ref 0.0–0.2)

## 2021-02-26 LAB — URINE CULTURE: Culture: 20000 — AB

## 2021-02-26 LAB — MRSA NEXT GEN BY PCR, NASAL: MRSA by PCR Next Gen: NOT DETECTED

## 2021-02-26 MED ORDER — ORAL CARE MOUTH RINSE
15.0000 mL | Freq: Two times a day (BID) | OROMUCOSAL | Status: DC
  Administered 2021-02-26 – 2021-03-02 (×8): 15 mL via OROMUCOSAL

## 2021-02-26 MED ORDER — DILTIAZEM HCL 60 MG PO TABS
60.0000 mg | ORAL_TABLET | Freq: Four times a day (QID) | ORAL | Status: DC
  Administered 2021-02-26 – 2021-02-27 (×3): 60 mg via ORAL
  Filled 2021-02-26 (×3): qty 1

## 2021-02-26 MED ORDER — DILTIAZEM HCL 30 MG PO TABS
30.0000 mg | ORAL_TABLET | Freq: Four times a day (QID) | ORAL | Status: DC
  Administered 2021-02-26: 30 mg via ORAL
  Filled 2021-02-26: qty 1

## 2021-02-26 MED ORDER — FUROSEMIDE 10 MG/ML IJ SOLN
60.0000 mg | Freq: Once | INTRAMUSCULAR | Status: AC
  Administered 2021-02-26: 60 mg via INTRAVENOUS
  Filled 2021-02-26: qty 6

## 2021-02-26 NOTE — Progress Notes (Signed)
PROGRESS NOTE    SHAWNE ESKELSON  RWE:[id number] DOB: 1950/09/18 DOA: 02/23/2021 PCP: Townsend Roger, MD    Brief Narrative:  [name] was admitted to the hospital working diagnosis of severe sepsis due to urinary tract infection, Klebsiella bacteremia, ESBL,   70 year old male past medical history for atrial fibrillation, coronary artery disease, nonalcoholic cirrhosis, dyslipidemia, type 2 diabetes mellitus, and urolithiasis who presented with fevers, chills and hematuria. 8/26 patient underwent cystoscopy, left retrograde pyelogram with interpretation, left percutaneous renal access, left nephrolithotomy, left nephrostogram, left ureteral stent. Postprocedure he had a Foley catheter placed for urinary retention. On 9/3 patient returned to the ED because urinary retention.  Apparently his Foley catheter was blocked by blood clot at home and he removed it himself.  Foley catheter was replaced the patient was discharged home. Patient continued to have fevers, chills along with back pain at home that prompted him to come back to the hospital.  On his initial physical examination his temperature was 103 F, blood pressure 87/59, heart rate 155, respiratory 28, oxygen saturation 72% on room air.  He was awake and alert, dry mucous membranes, lungs clear to auscultation bilaterally, heart S1-S2, tachycardic, abdomen soft nontender.   Sodium 136, potassium 4.1, chloride 101, bicarb 24, glucose 235, BUN 35, creatinine 2.1, AST 113, ALT 27, high sensitive troponin 39, lactic acid 1.2, white count 9.0, hemoglobin 6.8, hematocrit 20.1, platelets 125. SARS COVID-19 negative.   Urinalysis > 50 white cells, > 50 red cells.  Brown color, turbid.   CT of the abdomen pelvis left double-J ureteral stent in place.  No hydroureter.  Several mildly dilated loops of small bowel in the upper quadrant, no obvious transition to indicate obstruction.   Chest radiograph no infiltrates.   EKG 107 bpm, normal axis, normal  intervals, sinus rhythm, no significant ST segment changes, negative T waves V4-V6.   Patient was placed on antibiotic therapy and IV fluids.   Culture positive for multidrug resistant Klebsiella.   09/09 patient developed atrial fibrillation with RVR, placed on diltiazem infusion, converting to sinus rhythm.  PRBC transfusion for acute blood loss anemia due to hematuria.    Assessment & Plan:   Principal Problem:   Severe Sepsis secondary to UTI [facility]) Active Problems:   Coronary artery disease involving native coronary artery of native heart without angina pectoris   Atrial fibrillation s/p PVI ablation at [facility] 2001   Obstructive sleep apnea syndrome   Cirrhosis of liver without ascites (HCC)   Type 2 diabetes mellitus without complications (HCC)   AKI (acute kidney injury) (HCC)   Anemia   Thrombocytopenia (HCC)   Sepsis with acute renal failure without septic shock (HCC)     Severe sepsis due to urinary tract infection (complicated) in the setting of indwelling foley catheter. (Gram negative bacteremia) Urine culture and blood culture positive for Klebsiella ESBL. CT with no evident abscess.  Blood work is pending for this morning.   Last fever spike yesterday around noon yesterday.   Continue antibiotic therapy with meropenem.  This am with signs of volume overload with rales, rhonchi and hypoxemia (acute hypoxemic respiratory failure) oxygen saturation 89% on supplemental 02.  Keep MAP 65 or greater.   On continuous bladder irrigation.    2. Acute blood loss anemia due to hematuria. thrombocytopenia Sp 2 units PRBC transfusion.  Urinary cathter bag with blood tinged urine. Pending H&H this am.   Will transfuse as needed.    3. AKI hyponatremia. Pending blood  work this am, positive signs of hypervolemia, serum cr 1.87.  Will add 60 mg IV furosemide today and follow up on response.    4. Atrial fibrillation sp ablation.  Patient has converted to sinus rhythm heart  rate in the 80's, blood pressure improved up to 951 to 884 systolic. Will transition to oral diltiazem 30 mg q 6 hrs for rate control.  At home he was taking 120 mg q 36 hrs, his cardiology is at [facility].   Continue to hold on anticoagulation due to acute bleeding.    5. T2DM/ dyslipidemia. Hyperglycemia, fasting glucose has been up to 218. Patient with nausea yesterday, will continue sliding scale insulin, if persistent high will add basal insulin. At home on metformin and pioglitazone   6. Acute gastritis. This am improved nausea, will continue with bid pantoprazole and sucralfate for now.      Patient continue to be at high risk for worsening sepsis   Status is: Inpatient  Remains inpatient appropriate because:Inpatient level of care appropriate due to severity of illness  Dispo: The patient is from: Home              Anticipated d/c is to: Home              Patient currently is not medically stable to d/c.   Difficult to place patient No   DVT prophylaxis:scd   Code Status:   full  Family Communication:  I spoke over the phone with the patient's wife about patient's  condition, plan of care, prognosis and all questions were addressed.      Nutrition Status: Nutrition Problem: Increased nutrient needs Etiology: acute illness (sepsis) Signs/Symptoms: estimated needs Interventions: Ensure Enlive (each supplement provides 350kcal and 20 grams of protein), MVI    Consultants:  Urology   Antimicrobials:  Meropenem     Subjective: Patient feeling better, but not yet back to baseline, this am congested and cough, no nausea or vomiting.   Objective: Vitals:   02/25/21 2217 02/26/21 0227 02/26/21 0445 02/26/21 0745  BP: 112/68 (!) 93/51 (!) 100/51 (!) 96/50  Pulse:    79  Resp:  16  19  Temp:  98.4 F (36.9 C) 99.7 F (37.6 C)   TempSrc:   Axillary   SpO2:    (!) 89%  Weight:      Height:        Intake/Output Summary (Last 24 hours) at 02/26/2021 0827 Last data  filed at 02/26/2021 1660 Gross per 24 hour  Intake 16589.25 ml  Output 23700 ml  Net -7110.75 ml   Filed Weights   02/23/21 1541  Weight: 72.5 kg    Examination:   General: Not in pain or dyspnea, deconditioned  Neurology: Awake and alert, non focal  E ENT: improved pallor, no icterus, oral mucosa moist Cardiovascular: No JVD. S1-S2 present, rhythmic, no gallops, rubs, or murmurs. No lower extremity edema. Pulmonary: positive breath sounds bilaterally, with no wheezing, positive bilateral rhonchi and rales. Gastrointestinal. Abdomen soft and non tender Skin. No rashes Musculoskeletal: no joint deformities     Data Reviewed: I have personally reviewed following labs and imaging studies  CBC: Recent Labs  Lab 02/19/21 2324 02/23/21 1605 02/24/21 0501 02/25/21 0332 02/25/21 1718  WBC 6.4 9.0 7.8 4.2 6.0  NEUTROABS  --  8.4*  --  3.8  --   HGB 9.0* 6.8* 8.6* 7.6* 8.8*  HCT 26.4* 20.1* 24.5* 22.2* 25.7*  MCV 93.6 95.3 90.4 91.0 90.2  PLT  181 125* 111* 93* 415*   Basic Metabolic Panel: Recent Labs  Lab 02/19/21 2324 02/23/21 1605 02/24/21 0501 02/25/21 0332 02/26/21 0344  NA 134* 136 136 128*  --   K 3.7 4.1 4.3 3.6  --   CL 99 101 101 96*  --   CO2 24 24 26  21*  --   GLUCOSE 254* 235* 228* 177*  --   BUN 16 35* 35* 35*  --   CREATININE 1.21 2.11* 1.99* 1.59* 1.87*  CALCIUM 8.9 8.1* 8.0* 7.1*  --    GFR: Estimated Creatinine Clearance: 35.3 mL/min (A) (by C-G formula based on SCr of 1.87 mg/dL (H)). Liver Function Tests: Recent Labs  Lab 02/19/21 2324 02/23/21 1605 02/24/21 0501  AST 15 113* 174*  ALT 12 27 47*  ALKPHOS 62 65 62  BILITOT 0.8 1.4* 1.4*  PROT 6.5 6.1* 5.9*  ALBUMIN 3.9 3.3* 2.8*   No results for input(s): LIPASE, AMYLASE in the last 168 hours. No results for input(s): AMMONIA in the last 168 hours. Coagulation Profile: Recent Labs  Lab 02/23/21 1605 02/24/21 0501  INR 1.2 1.2   Cardiac Enzymes: No results for input(s):  CKTOTAL, CKMB, CKMBINDEX, TROPONINI in the last 168 hours. BNP (last 3 results) No results for input(s): PROBNP in the last 8760 hours. HbA1C: No results for input(s): HGBA1C in the last 72 hours. CBG: Recent Labs  Lab 02/25/21 0830 02/25/21 1154 02/25/21 1805 02/25/21 2036 02/26/21 0710  GLUCAP 162* 147* 211* 154* 218*   Lipid Profile: No results for input(s): CHOL, HDL, LDLCALC, TRIG, CHOLHDL, LDLDIRECT in the last 72 hours. Thyroid Function Tests: No results for input(s): TSH, T4TOTAL, FREET4, T3FREE, THYROIDAB in the last 72 hours. Anemia Panel: No results for input(s): VITAMINB12, FOLATE, FERRITIN, TIBC, IRON, RETICCTPCT in the last 72 hours.    Radiology Studies: I have reviewed all of the imaging during this hospital visit personally     Scheduled Meds:  sodium chloride   Intravenous Once   sodium chloride   Intravenous Once   baclofen  10 mg Oral TID   buPROPion  300 mg Oral Daily   Chlorhexidine Gluconate Cloth  6 each Topical Daily   feeding supplement  237 mL Oral Q24H   gabapentin  600 mg Oral TID   insulin aspart  0-15 Units Subcutaneous TID WC   insulin aspart  0-5 Units Subcutaneous QHS   multivitamin with minerals  1 tablet Oral Daily   niacin  500 mg Oral Daily   pantoprazole  40 mg Oral BID   rOPINIRole  1 mg Oral QHS   rosuvastatin  5 mg Oral QPM   sucralfate  1 g Oral TID WC & HS   tamsulosin  0.4 mg Oral QPM   Continuous Infusions:  sodium chloride Stopped (02/24/21 1342)   sodium chloride 75 mL/hr at 02/26/21 0605   diltiazem (CARDIZEM) infusion 15 mg/hr (02/26/21 0808)   meropenem (MERREM) IV 1 g (02/25/21 2212)   promethazine (PHENERGAN) injection (IM or IVPB) 25 mg (02/26/21 0557)   sodium chloride irrigation       LOS: 3 days        Blannie Shedlock Gerome Apley, MD

## 2021-02-26 NOTE — Progress Notes (Signed)
Sent abg down to the lab. Called to let them know it was coming,

## 2021-02-26 NOTE — Progress Notes (Signed)
  Subjective: Patient resting comfortably this morning, urine light pink with very small clot in the tubing but overall not bright red  Objective: Vital signs in last 24 hours: Temp:  [97.7 F (36.5 C)-100.7 F (38.2 C)] 99.7 F (37.6 C) (09/10 0445) Pulse Rate:  [79-171] 79 (09/10 0745) Resp:  [16-22] 19 (09/10 0745) BP: (72-116)/(50-82) 96/50 (09/10 0745) SpO2:  [89 %-100 %] 89 % (09/10 0745)  Intake/Output from previous day: 09/09 0701 - 09/10 0700 In: 16589.3 [I.V.:1041.3; Blood:448; IV [id number]:335] Out: 23325 [Urine:23325] Intake/Output this shift: Total I/O In: -  Out: 375 [Urine:375]  Physical Exam:  General: Alert and oriented  Lab Results: Recent Labs    02/24/21 0501 02/25/21 0332 02/25/21 1718  HGB 8.6* 7.6* 8.8*  HCT 24.5* 22.2* 25.7*   BMET Recent Labs    02/24/21 0501 02/25/21 0332 02/26/21 0344  NA 136 128*  --   K 4.3 3.6  --   CL 101 96*  --   CO2 26 21*  --   GLUCOSE 228* 177*  --   BUN 35* 35*  --   CREATININE 1.99* 1.59* 1.87*  CALCIUM 8.0* 7.1*  --      Studies/Results: No results found.  Assessment/Plan: 70 year old status post left PCNL complicated by ongoing gross hematuria and subsequent urinary retention which then led to complicated UTI and urosepsis.  He has developed anemia from acute surgical blood loss, and he has required 1 unit of packed red blood cells upon admission and is scheduled to receive another.  Due to his anemia and urosepsis he reverted into atrial fibrillation with RVR, which is now currently being addressed with diltiazem drip.  His hematuria has been persistent, although he does not appear to be briskly bleeding.  I suspect that he has a large organized clot in his bladder that is breaking down over time, and then a lot of the bleeding is related to that.  I do not think he has ongoing upper tract bleeding  Plan/recommendation: Continue CBI as urine appears to be clearing, urine is not bright red so likely  not active bleed and may have organized clot which we will continue to lyse. Continue antibiotics stone culture and sensitivity    LOS: 3 days   Kristopher [name] 02/26/2021, 8:58 AM

## 2021-02-26 NOTE — Progress Notes (Signed)
Patient back on atrial fibrillation with RVR. He has been very weak and deconditioned. Positive confusion and disorientation.   Plan to resume diltiazem drip and transfer to step down unit for close monitoring of heart rate, hemodynamics and neurologic changes. Will do a Stat head CT as well.

## 2021-02-26 NOTE — Consult Note (Signed)
[facility] Telemedicine Consult   Requesting Provider: Sander Radon Consult Participants: Myself, bedside nurse, rapid response nurse, atrium nurse  Location of the provider: [facility] [place] Location of the patient: Krist[facility] [name]  This consult was provided via telemedicine with 2-way video and audio communication. The patient/family was informed that care would be provided in this way and agreed to receive care in this manner.    Chief Complaint: Confusion, possible aphasia  HPI: 70 y.o. 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  He recently underwent cystoscopy on 8/26 with nephrolithotomy, nephrostogram and ureteral stent all on the left side.  He returned to the ED on 9/7 with fevers, chills and back pain, and was found to have sepsis (temperature 103, blood pressures 80s over high 50s, heart rates 155 (A. fib with RVR), respiratory rates in the high 20s, and oxygen saturation in the 70s on room air.  Blood and urine cultures have returned positive for drug-resistant Klebsiella and he has been on meropenem most recently.  Hospital course has additionally been complicated by acute blood loss due to hematuria and thrombocytopenia requiring 3 units of packed red blood cells this hospitalization to date as well as significant acute kidney injury with creatinine of 1.93 most recently (baseline 0.9).  Regarding his atrial fibrillation his reportedly had ablation in the past, however he has been recurrently in atrial fibrillation with RVR today.  He is lethargic but able to converse normally on morning rounds today confirmed well by bedside team around 9 AM.  Wife arrived around noon and felt the patient was not speaking  clearly with gradual progression of the symptoms throughout the day.  Rapid response nurse was activated this evening and activated code stroke for aphasia concern.  Patient is able to provide limited history secondary to confusion    Past Medical History:  Diagnosis Date   Anemia 12/2020   Anginal pain ([place])    Atrial fibrillation ([place])    a. s/p PVI X2 by Dr Omelia Blackwater at [facility] around 2000.   CAD (coronary artery disease)    a. NSTEMI (06/2013):  LHC (06/27/2013):  pLAD 10-20, RCA 90, EF 45-50%, inf HK.  PCI:  Xience Alpine (3 x 23 mm) DES to the RCA. b. Relook cath 07/10/13 - patent stent. Imdur added. c. Recurrent CP after that - abnormal nuc 07/2017 with chest pain, dyspnea, hypotensive response. Rest images with mid-basal inferior thinning. No stress images due to abrupt d/c of test. Relook cath 3/12 - no significant r   Chronic back pain greater than 3 months duration    Cirrhosis, non-alcoholic (HCC)    Depression    DJD (degenerative joint disease) of cervical spine    "3 herniated discs" (09/01/2013)   DJD (degenerative joint disease), lumbosacral    "bulging L5-S1" (09/01/2013)   Dysrhythmia    PVCs   Herniated cervical disc    c2-T1 fusion   HLD (hyperlipidemia)    Ischemic cardiomyopathy    a. Echo (06/28/2011): Inferior and inferoseptal akinesis, EF 76%, grade 2 diastolic dysfunction, MAC, small effusion (no tamponade). B. EF 40% by echo 06/2013, cath 07/2013, 55% by cath 08/2013 with subtle mild mid inferior hypocontractility.   Liver cirrhosis secondary to NASH [facility])    NSTEMI (non-ST elevated myocardial infarction) ([place]) 06/27/2013   Osteoarthritis    "  neck and lower back" (09/01/2013)   Pericardial effusion    a. Chronic since hx of pericarditis in 2004. b. Mod by echo 06/2013, small by echo 06/2013.   Pneumonia ~ 2005   Type II diabetes mellitus ([place])     Past Surgical History:  Procedure Laterality Date   APPENDECTOMY  ~ 1965   ATRIAL FIBRILLATION ABLATION  2000   Dr Omelia Blackwater  at [place] N/A 10/07/2013   Procedure: LEFT AND RIGHT HEART CATHETERIZATION WITH CORONARY ANGIOGRAM;  Surgeon: Troy Sine, MD;  Location: [facility] CATH LAB;  Service: Cardiovascular;  Laterality: N/A;   LEFT HEART CATHETERIZATION WITH CORONARY ANGIOGRAM N/A 06/27/2013   Procedure: LEFT HEART CATHETERIZATION WITH CORONARY ANGIOGRAM;  Surgeon: Troy Sine, MD;  Location: [facility] CATH LAB;  Service: Cardiovascular;  Laterality: N/A;   LEFT HEART CATHETERIZATION WITH CORONARY ANGIOGRAM N/A 07/10/2013   Procedure: LEFT HEART CATHETERIZATION WITH CORONARY ANGIOGRAM;  Surgeon: Burnell Blanks, MD;  Location: [facility] CATH LAB;  Service: Cardiovascular;  Laterality: N/A;   LEFT HEART CATHETERIZATION WITH CORONARY ANGIOGRAM N/A 08/28/2013   Procedure: LEFT HEART CATHETERIZATION WITH CORONARY ANGIOGRAM;  Surgeon: Troy Sine, MD;  Location: [facility] CATH LAB;  Service: Cardiovascular;  Laterality: N/A;   NEPHROLITHOTOMY Left 02/11/2021   Procedure: LEFT NEPHROLITHOTOMY PERCUTANEOUS WITH SURGEON ACCESS;  Surgeon: [name], MD;  Location: [facility] ORS;  Service: Urology;  Laterality: Left;   PERCUTANEOUS CORONARY STENT INTERVENTION (PCI-S)  06/27/2013   Procedure: PERCUTANEOUS CORONARY STENT INTERVENTION (PCI-S);  Surgeon: Troy Sine, MD;  Location: [facility] CATH LAB;  Service: Cardiovascular;;   POSTERIOR CERVICAL FUSION/FORAMINOTOMY  2009   fusion c3-t1   PULMONARY VEIN ABLATION  2001   Dr Omelia Blackwater at [facility]    Current Outpatient Medications  Medication Instructions   aspirin EC 81 mg, Oral, Every evening   baclofen (LIORESAL) 10 mg, Oral, 3 times daily   buPROPion (WELLBUTRIN XL) 300 mg, Oral, Daily   ciprofloxacin (CIPRO) 500 mg, Oral, 2 times daily   clopidogrel (PLAVIX) 75 mg, Oral, Daily   diltiazem (CARDIZEM SR) 120 mg, Oral, See admin instructions, Take 120 mg by mouth every 36 hours   gabapentin (NEURONTIN) 600 mg, Oral, 3  times daily   ibuprofen (ADVIL) 800 mg, Oral, Every 6 hours PRN   metFORMIN (GLUCOPHAGE-XR) 1,000 mg, Oral, 2 times daily   niacin 500 mg, Oral, Daily   nitroGLYCERIN (NITROSTAT) 0.4 mg, Every 5 min PRN   pantoprazole (PROTONIX) 40 mg, Oral, 2 times daily   pioglitazone (ACTOS) 15 mg, Oral, Daily   promethazine (PHENERGAN) 25 mg, Oral, Every 6 hours PRN   rOPINIRole (REQUIP) 1 mg, Oral, Daily at bedtime   rosuvastatin (CRESTOR) 5 mg, Oral, Every evening   tamsulosin (FLOMAX) 0.4 mg, Oral, Every evening   traMADol (ULTRAM) 50-100 mg, Oral, Every 6 hours PRN    Current Facility-Administered Medications:    0.9 %  sodium chloride infusion (Manually program via Guardrails IV Fluids), , Intravenous, Once, Garba, Mohammad L, MD   0.9 %  sodium chloride infusion (Manually program via Guardrails IV Fluids), , Intravenous, Once, Arrien, Jimmy Picket, MD   0.9 %  sodium chloride infusion, , Intravenous, PRN, Elwyn Reach, MD, Stopped at 02/24/21 1342   baclofen (LIORESAL) tablet 10 mg, 10 mg, Oral, TID, Jonelle [name], Mohammad L, MD, 10 mg at 02/26/21 1549   buPROPion (WELLBUTRIN XL) 24 hr tablet 300 mg, 300  mg, Oral, Daily, Jonelle [name], Mohammad L, MD, 300 mg at 02/26/21 1914   Chlorhexidine Gluconate Cloth 2 % PADS 6 each, 6 each, Topical, Daily, Gala Romney L, MD, 6 each at 02/24/21 1341   diltiazem (CARDIZEM) 125 mg in dextrose 5% 125 mL (1 mg/mL) infusion, 5-15 mg/hr, Intravenous, Titrated, Arrien, Jimmy Picket, MD, Last Rate: 10 mL/hr at 02/26/21 1720, 10 mg/hr at 02/26/21 1720   diltiazem (CARDIZEM) tablet 60 mg, 60 mg, Oral, Q6H, Arrien, Jimmy Picket, MD, 60 mg at 02/26/21 1651   feeding supplement (ENSURE ENLIVE / ENSURE PLUS) liquid 237 mL, 237 mL, Oral, Q24H, Arrien, Jimmy Picket, MD   fentaNYL (SUBLIMAZE) injection 12.5 mcg, 12.5 mcg, Intravenous, Q2H PRN, Arrien, Jimmy Picket, MD   gabapentin (NEURONTIN) capsule 600 mg, 600 mg, Oral, TID, Jonelle [name], Mohammad L, MD, 600 mg at  02/26/21 1550   insulin aspart (novoLOG) injection 0-15 Units, 0-15 Units, Subcutaneous, TID WC, Garba, Mohammad L, MD, 3 Units at 02/26/21 1652   insulin aspart (novoLOG) injection 0-5 Units, 0-5 Units, Subcutaneous, QHS, Garba, Mohammad L, MD, 2 Units at 02/23/21 2355   meropenem (MERREM) 1 g in sodium chloride 0.9 % 100 mL IVPB, 1 g, Intravenous, Q12H, Arrien, Jimmy Picket, MD, Last Rate: 200 mL/hr at 02/26/21 1045, 1 g at 02/26/21 1045   metoprolol tartrate (LOPRESSOR) injection 5 mg, 5 mg, Intravenous, Q4H PRN, Arrien, Jimmy Picket, MD, 5 mg at 02/25/21 1136   multivitamin with minerals tablet 1 tablet, 1 tablet, Oral, Daily, Arrien, Jimmy Picket, MD, 1 tablet at 02/26/21 7829   niacin tablet 500 mg, 500 mg, Oral, Daily, Jonelle [name], Mohammad L, MD, 500 mg at 02/26/21 0807   nitroGLYCERIN (NITROSTAT) SL tablet 0.4 mg, 0.4 mg, Sublingual, Q5 min PRN, Jonelle [name], Mohammad L, MD   ondansetron (ZOFRAN) injection 4 mg, 4 mg, Intravenous, Q6H PRN, Arrien, Jimmy Picket, MD, 4 mg at 02/26/21 0310   pantoprazole (PROTONIX) EC tablet 40 mg, 40 mg, Oral, BID, Jonelle [name], Mohammad L, MD, 40 mg at 02/26/21 0806   promethazine (PHENERGAN) 25 mg in sodium chloride 0.9 % 50 mL IVPB, 25 mg, Intravenous, Q6H PRN, Arrien, Jimmy Picket, MD, Last Rate: 200 mL/hr at 02/26/21 0557, 25 mg at 02/26/21 0557   rOPINIRole (REQUIP) tablet 1 mg, 1 mg, Oral, QHS, Garba, Mohammad L, MD, 1 mg at 02/25/21 2206   rosuvastatin (CRESTOR) tablet 5 mg, 5 mg, Oral, QPM, Garba, Mohammad L, MD, 5 mg at 02/26/21 1651   Continuous Bladder Irrigation, , , Until Discontinued **AND** sodium chloride irrigation 0.9 % 3,000 mL, 3,000 mL, Irrigation, Continuous, [name], MD, 3,000 mL at 02/25/21 1844   sucralfate (CARAFATE) tablet 1 g, 1 g, Oral, TID WC & HS, Arrien, Jimmy Picket, MD, 1 g at 02/26/21 1550   tamsulosin (FLOMAX) capsule 0.4 mg, 0.4 mg, Oral, QPM, Garba, Mohammad L, MD, 0.4 mg at 02/26/21 1651   traMADol (ULTRAM)  tablet 100 mg, 100 mg, Oral, Q6H PRN, [name], MD, 100 mg at 02/26/21 1129   LKW: 9 AM Thrombolytic given?: No, ongoing bleeding requiring blood transfusions, out of the window IR Thrombectomy? No, diffusion imaging not consistent with LVO Modified Rankin Scale: 0-Completely asymptomatic and back to baseline post- stroke Time of teleneurologist evaluation: 6:10 PM  Exam: Vitals:   02/26/21 1334 02/26/21 1651  BP: 113/63 124/65  Pulse:    Resp:    Temp: 98.6 F (37 C)   SpO2:      General: Acutely ill appearing Pulmonary: Saturations of  90% when laying flat on 10 L [place], tachypenic  Cardiac: Afib w/ RVR to 150s on monitor   Prominent asterixis w/o drift when arms are extended, bilaterally   NIH Stroke scale 1A: Level of Consciousness - 0 1B: Ask Month and Age - 1 -- reports it is January  1C: 'Blink Eyes' & 'Squeeze Hands' - 0 2: Test Horizontal Extraocular Movements - 0 3: Test Visual Fields - 0 4: Test Facial Palsy - 0 5A: Test Left Arm Motor Drift - 0 5B: Test Right Arm Motor Drift - 0 6A: Test Left Leg Motor Drift - 1 6B: Test Right Leg Motor Drift - 1 7: Test Limb Ataxia - 0 8: Test Sensation - 0 9: Test Language/Aphasia- 1 (vs. poor attention / perseveration)  10: Test Dysarthria - 0 11: Test Extinction/Inattention - 0 NIHSS score: 4   Imaging Reviewed:  Head CT negative for acute intracranial process MRI brain negative for acute intracranial process, limited sequences obtained on my instructions (DWI and FLAIR only, with exam terminated confirmation of negative findings to allow for medical stabilization of the patient)  Labs reviewed in epic and pertinent values follow: Creatinine 1.93 Glucose 212   Assessment: This is a 70 year old gentleman with a Klebsiella bacteremia and UTI after stent placement, course complicated by hypoxia, anemia, acute kidney injury, A. fib with RVR.  His current neurological symptoms are nonfocal and appear most  consistent with delirium.  Given his significant stroke risk factors, acute left MCA territory stroke contributing to language difficulties was ruled out with MRI.  At this time recommend continuing to optimize him from a medical perspective and expect improvement of his mental status with improvement of his toxic/metabolic state  Recommendations:  -Emergent MRI brain abbreviated stroke protocol obtained to rule out stroke / significant large vessel occlusion, reassuringly negative -Minimize deliriogenic medications, consider reducing dose of baclofen (please do not stop suddenly to avoid risk of withdrawal seizures) -Treatment of comorbidities per primary team -Please reach out to neurology if new neurological questions or concerns arise or if patient does not improve with improvement in his toxic/metabolic parameters  This patient is receiving care for possible acute neurological changes. There was 60 minutes of care by this provider at the time of service, including time for direct evaluation via telemedicine, review of medical records, imaging studies and discussion of findings with providers, the patient and/or family.  Lesleigh Noe MD-PhD [facility] [phone] If 8pm-8am, please page neurology on call as listed in [place].  CRITICAL CARE Performed by: Lorenza Chick   Total critical care time: 60 minutes  Critical care time was exclusive of separately billable procedures and treating other patients.  Critical care was necessary to treat or prevent imminent or life-threatening deterioration with concern for acute stroke  Critical care was time spent personally by me on the following activities: development of treatment plan with patient and/or surrogate as well as nursing, discussions with consultants, evaluation of patient's response to treatment, examination of patient, obtaining history from patient or surrogate, ordering and performing treatments and interventions,  ordering and review of laboratory studies, ordering and review of radiographic studies, pulse oximetry and re-evaluation of patient's condition.

## 2021-02-27 ENCOUNTER — Inpatient Hospital Stay (HOSPITAL_COMMUNITY): Payer: Medicare HMO

## 2021-02-27 DIAGNOSIS — J9601 Acute respiratory failure with hypoxia: Secondary | ICD-10-CM

## 2021-02-27 DIAGNOSIS — G9340 Encephalopathy, unspecified: Secondary | ICD-10-CM

## 2021-02-27 LAB — CBC WITH DIFFERENTIAL/PLATELET
Abs Immature Granulocytes: 0.06 10*3/uL (ref 0.00–0.07)
Basophils Absolute: 0 10*3/uL (ref 0.0–0.1)
Basophils Relative: 0 %
Eosinophils Absolute: 0 10*3/uL (ref 0.0–0.5)
Eosinophils Relative: 0 %
HCT: 26.4 % — ABNORMAL LOW (ref 39.0–52.0)
Hemoglobin: 9 g/dL — ABNORMAL LOW (ref 13.0–17.0)
Immature Granulocytes: 1 %
Lymphocytes Relative: 4 %
Lymphs Abs: 0.4 10*3/uL — ABNORMAL LOW (ref 0.7–4.0)
MCH: 30.7 pg (ref 26.0–34.0)
MCHC: 34.1 g/dL (ref 30.0–36.0)
MCV: 90.1 fL (ref 80.0–100.0)
Monocytes Absolute: 0.8 10*3/uL (ref 0.1–1.0)
Monocytes Relative: 9 %
Neutro Abs: 7 10*3/uL (ref 1.7–7.7)
Neutrophils Relative %: 86 %
Platelets: 121 10*3/uL — ABNORMAL LOW (ref 150–400)
RBC: 2.93 MIL/uL — ABNORMAL LOW (ref 4.22–5.81)
RDW: 17.1 % — ABNORMAL HIGH (ref 11.5–15.5)
WBC: 8.2 10*3/uL (ref 4.0–10.5)
nRBC: 0 % (ref 0.0–0.2)

## 2021-02-27 LAB — MAGNESIUM: Magnesium: 1.9 mg/dL (ref 1.7–2.4)

## 2021-02-27 LAB — BASIC METABOLIC PANEL
Anion gap: 16 — ABNORMAL HIGH (ref 5–15)
BUN: 40 mg/dL — ABNORMAL HIGH (ref 8–23)
CO2: 17 mmol/L — ABNORMAL LOW (ref 22–32)
Calcium: 7.4 mg/dL — ABNORMAL LOW (ref 8.9–10.3)
Chloride: 97 mmol/L — ABNORMAL LOW (ref 98–111)
Creatinine, Ser: 2.16 mg/dL — ABNORMAL HIGH (ref 0.61–1.24)
GFR, Estimated: 33 mL/min — ABNORMAL LOW (ref >60)
Glucose, Bld: 231 mg/dL — ABNORMAL HIGH (ref 70–99)
Potassium: 3.2 mmol/L — ABNORMAL LOW (ref 3.5–5.1)
Sodium: 130 mmol/L — ABNORMAL LOW (ref 135–145)

## 2021-02-27 LAB — HEPATIC FUNCTION PANEL
ALT: 30 U/L (ref 0–44)
AST: 38 U/L (ref 15–41)
Albumin: 2.3 g/dL — ABNORMAL LOW (ref 3.5–5.0)
Alkaline Phosphatase: 66 U/L (ref 38–126)
Bilirubin, Direct: 0.4 mg/dL — ABNORMAL HIGH (ref 0.0–0.2)
Indirect Bilirubin: 0.8 mg/dL (ref 0.3–0.9)
Total Bilirubin: 1.2 mg/dL (ref 0.3–1.2)
Total Protein: 5.8 g/dL — ABNORMAL LOW (ref 6.5–8.1)

## 2021-02-27 LAB — PHOSPHORUS: Phosphorus: 2.2 mg/dL — ABNORMAL LOW (ref 2.5–4.6)

## 2021-02-27 LAB — GLUCOSE, CAPILLARY
Glucose-Capillary: 256 mg/dL — ABNORMAL HIGH (ref 70–99)
Glucose-Capillary: 212 mg/dL — ABNORMAL HIGH (ref 70–99)
Glucose-Capillary: 178 mg/dL — ABNORMAL HIGH (ref 70–99)

## 2021-02-27 MED ORDER — AMIODARONE LOAD VIA INFUSION
150.0000 mg | Freq: Once | INTRAVENOUS | Status: AC
Start: 2021-02-27 — End: 2021-02-27
  Administered 2021-02-27: 150 mg via INTRAVENOUS
  Filled 2021-02-27: qty 83.34

## 2021-02-27 MED ORDER — BACLOFEN 10 MG PO TABS
5.0000 mg | ORAL_TABLET | Freq: Three times a day (TID) | ORAL | Status: DC
  Administered 2021-02-27 – 2021-02-28 (×6): 5 mg via ORAL
  Filled 2021-02-27 (×6): qty 1

## 2021-02-27 MED ORDER — ONDANSETRON HCL 4 MG/2ML IJ SOLN
4.0000 mg | Freq: Once | INTRAMUSCULAR | Status: AC
  Administered 2021-02-27: 4 mg via INTRAVENOUS
  Filled 2021-02-27: qty 2

## 2021-02-27 MED ORDER — FUROSEMIDE 10 MG/ML IJ SOLN
60.0000 mg | Freq: Once | INTRAMUSCULAR | Status: AC
  Administered 2021-02-27: 60 mg via INTRAVENOUS
  Filled 2021-02-27: qty 6

## 2021-02-27 MED ORDER — AMIODARONE HCL IN DEXTROSE 360-4.14 MG/200ML-% IV SOLN
30.0000 mg/h | INTRAVENOUS | Status: DC
  Administered 2021-02-28 – 2021-03-03 (×9): 30 mg/h via INTRAVENOUS
  Filled 2021-02-27 (×8): qty 200

## 2021-02-27 MED ORDER — FAMOTIDINE 20 MG PO TABS: 20.0000 mg | ORAL_TABLET | Freq: Every day | ORAL | Status: DC

## 2021-02-27 MED ORDER — METOPROLOL TARTRATE 25 MG PO TABS: 50.0000 mg | ORAL_TABLET | Freq: Two times a day (BID) | ORAL | Status: DC

## 2021-02-27 MED ORDER — BUPROPION HCL ER (XL) 150 MG PO TB24
150.0000 mg | ORAL_TABLET | Freq: Every day | ORAL | Status: DC
  Administered 2021-02-28 – 2021-03-04 (×5): 150 mg via ORAL
  Filled 2021-02-27 (×5): qty 1

## 2021-02-27 MED ORDER — GABAPENTIN 300 MG PO CAPS
300.0000 mg | ORAL_CAPSULE | Freq: Three times a day (TID) | ORAL | Status: DC
  Administered 2021-02-27: 300 mg via ORAL
  Filled 2021-02-27: qty 1

## 2021-02-27 MED ORDER — POTASSIUM CHLORIDE 10 MEQ/100ML IV SOLN
10.0000 meq | INTRAVENOUS | Status: AC
  Administered 2021-02-27 (×3): 10 meq via INTRAVENOUS
  Filled 2021-02-27 (×4): qty 100

## 2021-02-27 MED ORDER — SODIUM CHLORIDE 0.9 % IV SOLN
12.5000 mg | INTRAVENOUS | Status: DC | PRN
  Administered 2021-02-27 – 2021-03-02 (×5): 12.5 mg via INTRAVENOUS
  Filled 2021-02-27 (×5): qty 12.5

## 2021-02-27 MED ORDER — METOPROLOL TARTRATE 25 MG PO TABS
50.0000 mg | ORAL_TABLET | Freq: Two times a day (BID) | ORAL | Status: DC
  Administered 2021-02-27 – 2021-03-04 (×10): 50 mg via ORAL
  Filled 2021-02-27 (×11): qty 2

## 2021-02-27 MED ORDER — SODIUM CHLORIDE 0.9 % IV SOLN
25.0000 mg | INTRAVENOUS | Status: DC | PRN
  Filled 2021-02-27: qty 1

## 2021-02-27 MED ORDER — GABAPENTIN 100 MG PO CAPS
100.0000 mg | ORAL_CAPSULE | Freq: Three times a day (TID) | ORAL | Status: DC
  Administered 2021-02-27 – 2021-03-04 (×15): 100 mg via ORAL
  Filled 2021-02-27 (×15): qty 1

## 2021-02-27 MED ORDER — SODIUM CHLORIDE 0.9 % IV BOLUS
500.0000 mL | Freq: Once | INTRAVENOUS | Status: AC
  Administered 2021-02-27: 500 mL via INTRAVENOUS

## 2021-02-27 MED ORDER — METOPROLOL TARTRATE 5 MG/5ML IV SOLN
5.0000 mg | Freq: Once | INTRAVENOUS | Status: AC
  Administered 2021-02-27: 5 mg via INTRAVENOUS

## 2021-02-27 MED ORDER — TRAMADOL HCL 50 MG PO TABS
50.0000 mg | ORAL_TABLET | Freq: Four times a day (QID) | ORAL | Status: DC | PRN
  Administered 2021-02-28 – 2021-03-02 (×4): 50 mg via ORAL
  Filled 2021-02-27 (×4): qty 1

## 2021-02-27 MED ORDER — POTASSIUM CHLORIDE 10 MEQ/100ML IV SOLN
INTRAVENOUS | Status: AC
  Administered 2021-02-27: 10 meq via INTRAVENOUS
  Filled 2021-02-27: qty 100

## 2021-02-27 MED ORDER — AMIODARONE HCL IN DEXTROSE 360-4.14 MG/200ML-% IV SOLN
60.0000 mg/h | INTRAVENOUS | Status: AC
  Administered 2021-02-27 (×2): 60 mg/h via INTRAVENOUS
  Filled 2021-02-27 (×2): qty 200

## 2021-02-27 MED ORDER — FENTANYL CITRATE (PF) 100 MCG/2ML IJ SOLN: 12.5000 ug | INTRAMUSCULAR | Status: DC | PRN

## 2021-02-27 NOTE — Progress Notes (Addendum)
PROGRESS NOTE    Kristopher [name]  XNA:[id number] DOB: Apr 04, 1951 DOA: 02/23/2021 PCP: Townsend Roger, MD    Brief Narrative:  Kristopher [name] was admitted to the hospital working diagnosis of severe sepsis due to urinary tract infection, Klebsiella bacteremia, ESBL, Hospitalization complicated with atrial fibrillation with RVR and acute heart failure.    70 year old male past medical history for atrial fibrillation, coronary artery disease, nonalcoholic cirrhosis, dyslipidemia, type 2 diabetes mellitus, and urolithiasis who presented with fevers, chills and hematuria. 8/26 patient underwent cystoscopy, left retrograde pyelogram with interpretation, left percutaneous renal access, left nephrolithotomy, left nephrostogram, left ureteral stent. Postprocedure he had a Foley catheter placed for urinary retention. On 9/3 patient returned to the ED because urinary retention.  Apparently his Foley catheter was blocked by blood clot at home and he removed it himself.  Foley catheter was replaced the patient was discharged home. Patient continued to have fevers, chills along with back pain at home that prompted him to come back to the hospital.  On his initial physical examination his temperature was 103 F, blood pressure 87/59, heart rate 155, respiratory 28, oxygen saturation 72% on room air.  He was awake and alert, dry mucous membranes, lungs clear to auscultation bilaterally, heart S1-S2, tachycardic, abdomen soft nontender.   Sodium 136, potassium 4.1, chloride 101, bicarb 24, glucose 235, BUN 35, creatinine 2.1, AST 113, ALT 27, high sensitive troponin 39, lactic acid 1.2, white count 9.0, hemoglobin 6.8, hematocrit 20.1, platelets 125. SARS COVID-19 negative.   Urinalysis > 50 white cells, > 50 red cells.  Brown color, turbid.   CT of the abdomen pelvis left double-J ureteral stent in place.  No hydroureter.  Several mildly dilated loops of small bowel in the upper quadrant, no obvious transition to indicate  obstruction.   Chest radiograph no infiltrates.   EKG 107 bpm, normal axis, normal intervals, sinus rhythm, no significant ST segment changes, negative T waves V4-V6.   Patient was placed on antibiotic therapy and IV fluids.   Culture positive for multidrug resistant Klebsiella.   09/09 patient developed atrial fibrillation with RVR, placed on diltiazem infusion, converting to sinus rhythm.  PRBC transfusion for acute blood loss anemia due to hematuria.    Patient transferred to step down unit on 09/10, because atrial fibrillation with RVR along with altered mental status. CVA work up with heat CT and brain MRI negative.    Assessment & Plan:   Principal Problem:   Severe Sepsis secondary to UTI [facility]) Active Problems:   Coronary artery disease involving native coronary artery of native heart without angina pectoris   Atrial fibrillation s/p PVI ablation at [facility] 2001   Obstructive sleep apnea syndrome   Cirrhosis of liver without ascites (HCC)   Type 2 diabetes mellitus without complications (HCC)   AKI (acute kidney injury) (HCC)   Anemia   Thrombocytopenia (HCC)   Sepsis with acute renal failure without septic shock (HCC)   Severe sepsis due to urinary tract infection (complicated) in the setting of indwelling foley catheter. (Gram negative bacteremia) Urine culture and blood culture positive for Klebsiella ESBL. CT with no evident abscess.   Wbc is 8,2. Patient has been afebrile.    Antibiotic therapy with meropenem #4 On continuous bladder irrigation.     2. Acute blood loss anemia due to hematuria. thrombocytopenia Sp 2 units PRBC transfusion.  Urinary cathter bag with blood tinged urine. His Hgb has stabilized at 9,0    3. AKI hyponatremia/ hypokalemia. Renal  function with serum cr at 2,16 with K at 3,2 and Na 130.  Had furosemide 60 mg yesterday, not able to document urine output due to continuous bladder irrigation.   Add 40 meq IV and check Mg.    4. Atrial  fibrillation sp ablation/ RVR with acute diastolic heart failure and acute hypoxemic respiratory failure .  Patient yesterday afternoon back in atrial fibrillation with RVR, diltiazem drip was resumed and patient transferred to step down unit.  This am his heart rate continue not controlled.  Plan to continue diltiazem drip for now and add oral metoprolol 50 mg, continue close telemetry monitoring. Will consult cardiology for further recommendations.   Oxygenation is 94% on 10  L/min per [place]. Will repeat chest film today, he may need more diuresis.   5. New metabolic encephalopathy. Patient is awake and alert but continue to have disorientation.  Work up with head CT and brain MRI negative for CVA, will decrease in 50% baclofen and gabapentin.    5. T2DM/ dyslipidemia. continue to have hyperglycemia, in the 200's range. He is critically ill and not having adequate po intake, for now will continue with insulin sliding scale.    6. Acute gastritis.  Continue with pantoprazole and sucralfate.   Patient continue to be at high risk for worsening atrial fibrillation and encephalopathy.   Status is: Inpatient  Remains inpatient appropriate because:Inpatient level of care appropriate due to severity of illness  Dispo: The patient is from: Home              Anticipated d/c is to: Home              Patient currently is not medically stable to d/c.   Difficult to place patient No   DVT prophylaxis: Scd   Code Status:   full  Family Communication:  No family at the bedside      Nutrition Status: Nutrition Problem: Increased nutrient needs Etiology: acute illness (sepsis) Signs/Symptoms: estimated needs Interventions: Ensure Enlive (each supplement provides 350kcal and 20 grams of protein), MVI      Consultants:  Urology     Antimicrobials:  Meropenem.     Subjective: Patient very weak and deconditioned. Positive back pain and dyspnea. Positive nausea and vomiting    Objective: Vitals:   02/27/21 0400 02/27/21 0507 02/27/21 0600 02/27/21 0658  BP: 105/66 100/64 96/75   Pulse: (!) 122 (!) 143 (!) 131   Resp: 18 20 20    Temp: 98.6 F (37 C)   97.8 F (36.6 C)  TempSrc: Axillary   Axillary  SpO2: 96% 91% 94%   Weight:      Height:        Intake/Output Summary (Last 24 hours) at 02/27/2021 0836 Last data filed at 02/27/2021 0800 Gross per 24 hour  Intake 6817.37 ml  Output 7450 ml  Net -632.63 ml   Filed Weights   02/23/21 1541  Weight: 72.5 kg    Examination:   General: deconditioned and ill looking appearing  Neurology: Awake and alert, non focal  E ENT: positive pallor, no icterus, oral mucosa moist Cardiovascular: No JVD. S1-S2 present, irregularly irregular, no gallops, rubs, or murmurs. No lower extremity edema. Cold extremities  Pulmonary: positive breath sounds bilaterally, with no wheezing, or rhonchi Gastrointestinal. Abdomen soft and non tender Skin. No rashes Musculoskeletal: no joint deformities     Data Reviewed: I have personally reviewed following labs and imaging studies  CBC: Recent Labs  Lab 02/23/21 1605 02/24/21  0501 02/25/21 0332 02/25/21 1718 02/26/21 0933 02/27/21 0242  WBC 9.0 7.8 4.2 6.0 8.4 8.2  NEUTROABS 8.4*  --  3.8  --   --  7.0  HGB 6.8* 8.6* 7.6* 8.8* 9.4* 9.0*  HCT 20.1* 24.5* 22.2* 25.7* 27.6* 26.4*  MCV 95.3 90.4 91.0 90.2 89.3 90.1  PLT 125* 111* 93* 118* 139* 161*   Basic Metabolic Panel: Recent Labs  Lab 02/23/21 1605 02/24/21 0501 02/25/21 0332 02/26/21 0344 02/26/21 0933 02/27/21 0242  NA 136 136 128*  --  132* 130*  K 4.1 4.3 3.6  --  3.4* 3.2*  CL 101 101 96*  --  102 97*  CO2 24 26 21*  --  16* 17*  GLUCOSE 235* 228* 177*  --  203* 231*  BUN 35* 35* 35*  --  40* 40*  CREATININE 2.11* 1.99* 1.59* 1.87* 1.93* 2.16*  CALCIUM 8.1* 8.0* 7.1*  --  7.6* 7.4*   GFR: Estimated Creatinine Clearance: 30.6 mL/min (A) (by C-G formula based on SCr of 2.16 mg/dL (H)). Liver  Function Tests: Recent Labs  Lab 02/23/21 1605 02/24/21 0501  AST 113* 174*  ALT 27 47*  ALKPHOS 65 62  BILITOT 1.4* 1.4*  PROT 6.1* 5.9*  ALBUMIN 3.3* 2.8*   No results for input(s): LIPASE, AMYLASE in the last 168 hours. Recent Labs  Lab 02/26/21 1652  AMMONIA 25   Coagulation Profile: Recent Labs  Lab 02/23/21 1605 02/24/21 0501  INR 1.2 1.2   Cardiac Enzymes: No results for input(s): CKTOTAL, CKMB, CKMBINDEX, TROPONINI in the last 168 hours. BNP (last 3 results) No results for input(s): PROBNP in the last 8760 hours. HbA1C: No results for input(s): HGBA1C in the last 72 hours. CBG: Recent Labs  Lab 02/26/21 1610 02/26/21 1811 02/26/21 2004 02/26/21 2133 02/27/21 0744  GLUCAP 179* 212* 214* 241* 256*   Lipid Profile: No results for input(s): CHOL, HDL, LDLCALC, TRIG, CHOLHDL, LDLDIRECT in the last 72 hours. Thyroid Function Tests: No results for input(s): TSH, T4TOTAL, FREET4, T3FREE, THYROIDAB in the last 72 hours. Anemia Panel: No results for input(s): VITAMINB12, FOLATE, FERRITIN, TIBC, IRON, RETICCTPCT in the last 72 hours.    Radiology Studies: I have reviewed all of the imaging during this hospital visit personally     Scheduled Meds:  sodium chloride   Intravenous Once   sodium chloride   Intravenous Once   baclofen  5 mg Oral TID   buPROPion  300 mg Oral Daily   Chlorhexidine Gluconate Cloth  6 each Topical Daily   diltiazem  60 mg Oral Q6H   feeding supplement  237 mL Oral Q24H   gabapentin  300 mg Oral TID   insulin aspart  0-15 Units Subcutaneous TID WC   insulin aspart  0-5 Units Subcutaneous QHS   mouth rinse  15 mL Mouth Rinse BID   multivitamin with minerals  1 tablet Oral Daily   niacin  500 mg Oral Daily   pantoprazole  40 mg Oral BID   rOPINIRole  1 mg Oral QHS   rosuvastatin  5 mg Oral QPM   sucralfate  1 g Oral TID WC & HS   tamsulosin  0.4 mg Oral QPM   Continuous Infusions:  sodium chloride Stopped (02/24/21 1342)    diltiazem (CARDIZEM) infusion 15 mg/hr (02/27/21 0615)   meropenem (MERREM) IV Stopped (02/26/21 2211)   promethazine (PHENERGAN) injection (IM or IVPB) Stopped (02/27/21 0960)   sodium chloride irrigation  LOS: 4 days        Coletta Lockner Gerome Apley, MD

## 2021-02-27 NOTE — Progress Notes (Signed)
  Amiodarone Drug - Drug Interaction Consult Note  Recommendations: Amiodarone is metabolized by the cytochrome P450 system and therefore has the potential to cause many drug interactions. Amiodarone has an average plasma half-life of 50 days (range 20 to 100 days).   There is potential for drug interactions to occur several weeks or months after stopping treatment and the onset of drug interactions may be slow after initiating amiodarone.   [x]  Statins: Increased risk of myopathy. Simvastatin- restrict dose to 57m daily. Other statins: counsel patients to report any muscle pain or weakness immediately. Crestor 5 mg daily  []  Anticoagulants: Amiodarone can increase anticoagulant effect. Consider warfarin dose reduction. Patients should be monitored closely and the dose of anticoagulant altered accordingly, remembering that amiodarone levels take several weeks to stabilize.  []  Antiepileptics: Amiodarone can increase plasma concentration of phenytoin, the dose should be reduced. Note that small changes in phenytoin dose can result in large changes in levels. Monitor patient and counsel on signs of toxicity.  [x]  Beta blockers: increased risk of bradycardia, AV block and myocardial depression. Sotalol - avoid concomitant use. Metoprolol 577mPO BID  [x]   Calcium channel blockers (diltiazem and verapamil): increased risk of bradycardia, AV block and myocardial depression. Diltiazem IV infusion  []   Cyclosporine: Amiodarone increases levels of cyclosporine. Reduced dose of cyclosporine is recommended.  []  Digoxin dose should be halved when amiodarone is started.  []  Diuretics: increased risk of cardiotoxicity if hypokalemia occurs.  []  Oral hypoglycemic agents (glyburide, glipizide, glimepiride): increased risk of hypoglycemia. Patient's glucose levels should be monitored closely when initiating amiodarone therapy.   [x]  Drugs that prolong the QT interval:  Torsades de pointes risk may be  increased with concurrent use - avoid if possible.  Monitor QTc, also keep magnesium/potassium WNL if concurrent therapy can't be avoided.  Antibiotics: e.g. fluoroquinolones, erythromycin.  Antiarrhythmics: e.g. quinidine, procainamide, disopyramide, sotalol.  Antipsychotics: e.g. phenothiazines, haloperidol.   Lithium, tricyclic antidepressants, and methadone. Ondansetron 4 mg IV q6h PRN  Thank You,   ChGretta ArabharmD, BCPS Clinical Pharmacist [facility] main pharmacy [phone]/04/2021 5:01 PM

## 2021-02-27 NOTE — Progress Notes (Addendum)
Secure chat sent from bedside RN several times throughout the night with concerns for worsening afib with RVR. Review of chart shows pt HR range from 100-170's. Cardizem and prn doses of metoprolol given. However, HR sustained in 170's around 6:30 am despite continuing with cardizem gtt and metorpolol 55m IV given. Will give small fluid bolus and repeat metoprolol dose if BP allows. If ineffective cardiology consult and switch to amiodarone may be beneficial for further management.  XLovey Newcomer NP [facility] [phone]-7a [phone]

## 2021-02-27 NOTE — Progress Notes (Signed)
Subjective: Patient alert oriented this morning.  Had episode of rapid ventricular rate during stepdown transfer.  His urine remains very light pink on low drip CBI.  No clots.  Objective: Vital signs in last 24 hours: Temp:  [97.8 F (36.6 C)-99.5 F (37.5 C)] 97.8 F (36.6 C) (09/11 0658) Pulse Rate:  [68-150] 131 (09/11 0600) Resp:  [16-25] 20 (09/11 0600) BP: (94-131)/(31-75) 96/75 (09/11 0600) SpO2:  [90 %-97 %] 94 % (09/11 0600)  Intake/Output from previous day: 09/10 0701 - 09/11 0700 In: 6817.4 [I.V.:567.4; IV Piggyback:250] Out: 7075 [Urine:7075] Intake/Output this shift: Total I/O In: -  Out: 750 [Urine:750]  Physical Exam:  General: Alert and oriented   Lab Results: Recent Labs    02/25/21 1718 02/26/21 0933 02/27/21 0242  HGB 8.8* 9.4* 9.0*  HCT 25.7* 27.6* 26.4*   BMET Recent Labs    02/26/21 0933 02/27/21 0242  NA 132* 130*  K 3.4* 3.2*  CL 102 97*  CO2 16* 17*  GLUCOSE 203* 231*  BUN 40* 40*  CREATININE 1.93* 2.16*  CALCIUM 7.6* 7.4*     Studies/Results: MR BRAIN WO CONTRAST  Result Date: 02/26/2021 CLINICAL DATA:  Initial evaluation for neuro deficit, stroke suspected. EXAM: MRI HEAD WITHOUT CONTRAST TECHNIQUE: Multiplanar, multiecho pulse sequences of the brain and surrounding structures were obtained without intravenous contrast. COMPARISON:  Prior CT from earlier the same day. FINDINGS: Brain: A limited stroke protocol consisting of axial DWI and FLAIR sequences only was performed. Diffusion-weighted imaging demonstrates no convincing foci of restricted diffusion to suggest acute or subacute ischemia. Underlying age-related cerebral atrophy. Patchy confluent T2/FLAIR hyperintensity involving the periventricular deep white matter both cerebral hemispheres most consistent with chronic small vessel ischemic disease, moderate in nature. Chronic lacunar infarct present at the anterior left frontal centrum semi ovale. No visible mass lesion, mass  effect or midline shift. No hydrocephalus or extra-axial fluid collection. No secondary signs for acute intracranial hemorrhage. Vascular: Major intracranial vascular flow voids are grossly maintained at the skull base. Skull and upper cervical spine: No visible focal marrow replacing lesion. Scalp soft tissues demonstrate no acute finding. Sinuses/Orbits: Globes and orbital soft tissues grossly within normal limits. Paranasal sinuses are largely clear on this limited exam. Trace left with small to moderate right mastoid effusion noted. Visualized nasopharynx grossly unremarkable. Other: None. IMPRESSION: 1. Limited stroke protocol MRI only was performed. 2. No acute intracranial infarct. No other definite acute intracranial abnormality on this limited exam. 3. Underlying age-related cerebral atrophy with moderate chronic small vessel ischemic disease. Remote lacunar infarct at the left frontal centrum semi ovale. Electronically Signed   By: Jeannine Boga M.D.   On: 02/26/2021 22:07   DG CHEST PORT 1 VIEW  Result Date: 02/26/2021 CLINICAL DATA:  Dyspnea. EXAM: PORTABLE CHEST 1 VIEW COMPARISON:  02/23/2021 FINDINGS: Interval extensive patchy opacity in the right mid, upper and lower lung zones medially. Mild patchy opacity in the scratch the interval mild patchy opacity in the left upper lung zone. Normal sized heart. No pleural fluid. Thoracic spine degenerative changes. Lower cervical spine fixation hardware. Left chest loop recorder. IMPRESSION: Bilateral patchy airspace opacity in the lungs, significantly greater on the right compared to the left. This is concerning for bilateral, multilobar pneumonia. Alveolar edema is less likely in the absence of vascular congestion or cardiomegaly. Electronically Signed   By: Claudie Revering M.D.   On: 02/26/2021 14:55   CT HEAD CODE STROKE WO CONTRAST  Result Date: 02/26/2021 CLINICAL DATA:  Code  stroke.  Neuro deficit, acute, stroke suspected EXAM: CT HEAD  WITHOUT CONTRAST TECHNIQUE: Contiguous axial images were obtained from the base of the skull through the vertex without intravenous contrast. COMPARISON:  CT 02/23/2021. FINDINGS: Brain: No evidence of acute large vascular territory infarction, hemorrhage, hydrocephalus, extra-axial collection or mass lesion/mass effect. Similar patchy white matter hypodensities, nonspecific but compatible with chronic microvascular ischemic disease. Vascular: No hyperdense vessel identified. Skull: Normal. Negative for fracture or focal lesion. Sinuses/Orbits: Largely clear sinuses. Other: No mastoid effusions. ASPECTS [facility] Stroke Program Early CT Score) total score (0-10 with 10 being normal): 10. IMPRESSION: 1. No evidence of acute large vascular territory infarct or acute hemorrhage. ASPECTS is 10. 2. Chronic microvascular ischemic disease. Code stroke imaging results were communicated on 02/26/2021 at 6:22 pm to provider Dr. Cathlean Sauer via telephone, who verbally acknowledged these results. Electronically Signed   By: Margaretha Sheffield M.D.   On: 02/26/2021 18:24    Assessment/Plan: 1.  Hematuria status post PCNL, could be stent related Plan: Continue CBI wean as tolerated.    LOS: 4 days   Kristopher [name] 02/27/2021, 8:35 AM

## 2021-02-27 NOTE — Consult Note (Signed)
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  Since procedure has been passing blood/blood clots with urinary retention.  Present to the ER on 02/19/2021 with complaints of Foley dislocation and large urinary blood clot and inability to urinate.  Discharge after placing a new Foley.  Hemoglobin was noticed to be between 9-12 g%.  He represented to the emergency department on 02/23/2021 for this current admission with weakness, decreased mobility, hematuria, with clot retention and related hypotension requiring fluids, fever 103 Fahrenheit and sepsis syndrome and acute kidney injury of 2.1 creatinine  Course in the hospital Since admission he has become afebrile since 02/24/2021.  His admission blood cultures are growing Klebsiella pneumonia ESBL from urinary source of the same result along with Enterococcus faecalis in the urine.  He developed atrial fibrillation with rapid ventricular rate on 02/25/2021.  On 02/25/2021 or 24 French three-way Foley catheter was placed for irrigation and unable to irrigate.  He is also receiving blood transfusion.  He is being treated  with meropenem.  Then on 02/26/2021 he developed hypoxemia 89% with clinical features of volume overload and started on Lasix.  Continuous bladder irrigation has been continued.  Later on 02/26/2021 developed confusion/encephalopathy and transferred to stepdown.  And worsening oxygen needs on 02/26/2021 to 10 L nasal cannula ongoing septic encephalopathy.  On 9/11 atrial fibrillation improving on Cardizem infusion but hypoxemia worse to 12 L nasal cannula and renal injury worsening again after initial improvement.     Critical care medicine consulted 02/23/2021.   Pertinent  Medical History    has a past medical history of Anemia (12/2020), Anginal pain ([place]), Atrial fibrillation ([place]), CAD (coronary artery disease), Chronic back pain greater than 3 months duration, Cirrhosis, non-alcoholic ([place]), Depression, DJD (degenerative joint disease) of cervical spine, DJD (degenerative joint disease), lumbosacral, Dysrhythmia, Herniated cervical disc, HLD (hyperlipidemia), Ischemic cardiomyopathy, Liver cirrhosis secondary to NASH [facility]), NSTEMI (non-ST elevated myocardial infarction) ([place]) (06/27/2013), Osteoarthritis, Pericardial effusion, Pneumonia (~ 2005), and Type II diabetes mellitus ([place]).   reports that he quit smoking about 13 years ago. His smoking use included cigarettes. He has a 64.50 pack-year smoking history. He has never used smokeless tobacco.  Past Surgical History:  Procedure Laterality Date   APPENDECTOMY  ~ 1965   ATRIAL FIBRILLATION ABLATION  2000   Dr Omelia Blackwater at [place] N/A 10/07/2013   Procedure: LEFT AND RIGHT HEART CATHETERIZATION WITH CORONARY ANGIOGRAM;  Surgeon: Troy Sine, MD;  Location: [facility] CATH LAB;  Service: Cardiovascular;  Laterality: N/A;   LEFT HEART CATHETERIZATION WITH CORONARY ANGIOGRAM N/A 06/27/2013   Procedure: LEFT HEART CATHETERIZATION WITH CORONARY ANGIOGRAM;  Surgeon: Troy Sine, MD;  Location: [place]  CATH LAB;  Service: Cardiovascular;  Laterality: N/A;   LEFT HEART CATHETERIZATION WITH CORONARY ANGIOGRAM N/A 07/10/2013   Procedure: LEFT HEART CATHETERIZATION WITH CORONARY ANGIOGRAM;  Surgeon: Burnell Blanks, MD;  Location: [facility] CATH LAB;  Service: Cardiovascular;  Laterality: N/A;   LEFT HEART CATHETERIZATION WITH CORONARY ANGIOGRAM N/A 08/28/2013   Procedure: LEFT HEART CATHETERIZATION WITH CORONARY ANGIOGRAM;  Surgeon: Troy Sine, MD;  Location: [facility] CATH LAB;  Service: Cardiovascular;  Laterality: N/A;   NEPHROLITHOTOMY Left 02/11/2021   Procedure: LEFT NEPHROLITHOTOMY PERCUTANEOUS WITH SURGEON ACCESS;  Surgeon: [name], MD;  Location: [facility] ORS;  Service: Urology;  Laterality: Left;   PERCUTANEOUS CORONARY STENT INTERVENTION (PCI-S)  06/27/2013   Procedure: PERCUTANEOUS CORONARY STENT INTERVENTION (PCI-S);  Surgeon: Troy Sine, MD;  Location: [facility] CATH LAB;  Service: Cardiovascular;;   POSTERIOR CERVICAL FUSION/FORAMINOTOMY  2009   fusion c3-t1   PULMONARY VEIN ABLATION  2001   Dr Omelia Blackwater at [facility]    Allergies  Allergen Reactions   Codeine Nausea And Vomiting   Fentanyl Nausea And Vomiting   Naproxen Nausea Only   Other Nausea And Vomiting    1. N/V to all opiates. He is able to take versed. (noted 09/29/13) 2.The patient states that he has had  a reaction to all narcotics. He can take them though. (noted 06/26/13)    Immunization History  Administered Date(s) Administered   Influenza-Unspecified 04/19/2013   Pneumococcal Polysaccharide-23 09/02/2013    Family History  Problem Relation Age of Onset   COPD Other    Cancer Other    Diabetes Other    Heart disease Other    CAD Neg Hx        No hx early CAD     Current Facility-Administered Medications:    0.9 %  sodium chloride infusion (Manually program via Guardrails IV Fluids), , Intravenous, Once, Garba, Mohammad L, MD   0.9 %  sodium chloride infusion (Manually program via  Guardrails IV Fluids), , Intravenous, Once, Arrien, Jimmy Picket, MD   0.9 %  sodium chloride infusion, , Intravenous, PRN, Elwyn Reach, MD, Stopped at 02/24/21 1342   baclofen (LIORESAL) tablet 5 mg, 5 mg, Oral, TID, Arrien, Jimmy Picket, MD, 5 mg at 02/27/21 1028   buPROPion (WELLBUTRIN XL) 24 hr tablet 300 mg, 300 mg, Oral, Daily, Jonelle Sidle, Mohammad L, MD, 300 mg at 02/27/21 1028   Chlorhexidine Gluconate Cloth 2 % PADS 6 each, 6 each, Topical, Daily, Jonelle Sidle, Mohammad L, MD, 6 each at 02/24/21 1341   diltiazem (CARDIZEM) 125 mg in dextrose 5% 125 mL (1 mg/mL) infusion, 5-15 mg/hr, Intravenous, Titrated, Arrien, Jimmy Picket, MD, Last Rate: 15 mL/hr at 02/27/21 1047, 15 mg/hr at 02/27/21 1047   feeding supplement (ENSURE ENLIVE / ENSURE PLUS) liquid 237 mL, 237 mL, Oral, Q24H, Arrien, Jimmy Picket, MD   fentaNYL (SUBLIMAZE) injection 12.5 mcg, 12.5 mcg, Intravenous, Q2H PRN, Nyoka Cowden, Terri L, RPH   gabapentin (NEURONTIN) capsule 300 mg, 300 mg, Oral, TID, Arrien, Jimmy Picket, MD, 300 mg at 02/27/21 1028   insulin aspart (novoLOG) injection 0-15 Units, 0-15 Units, Subcutaneous, TID WC, Garba, Mohammad L, MD, 5 Units at 02/27/21 1217   insulin aspart (novoLOG) injection 0-5 Units, 0-5 Units, Subcutaneous, QHS, Elwyn Reach, MD, 2 Units at 02/26/21 2134   MEDLINE mouth rinse, 15 mL, Mouth Rinse, BID, Arrien, Jimmy Picket, MD, 15 mL at 02/27/21 1126   meropenem (MERREM) 1 g in  sodium chloride 0.9 % 100 mL IVPB, 1 g, Intravenous, Q12H, Arrien, Jimmy Picket, MD, Stopped at 02/27/21 1057   metoprolol tartrate (LOPRESSOR) injection 5 mg, 5 mg, Intravenous, Q4H PRN, Arrien, Jimmy Picket, MD, 5 mg at 02/27/21 0548   metoprolol tartrate (LOPRESSOR) tablet 50 mg, 50 mg, Oral, BID, Arrien, Jimmy Picket, MD, 50 mg at 02/27/21 1028   multivitamin with minerals tablet 1 tablet, 1 tablet, Oral, Daily, Arrien, Jimmy Picket, MD, 1 tablet at 02/26/21 0263   niacin tablet 500 mg, 500  mg, Oral, Daily, Jonelle Sidle, Mohammad L, MD, 500 mg at 02/27/21 1217   nitroGLYCERIN (NITROSTAT) SL tablet 0.4 mg, 0.4 mg, Sublingual, Q5 min PRN, Gala Romney L, MD   ondansetron (ZOFRAN) injection 4 mg, 4 mg, Intravenous, Q6H PRN, Arrien, Jimmy Picket, MD, 4 mg at 02/27/21 0605   pantoprazole (PROTONIX) EC tablet 40 mg, 40 mg, Oral, BID, Jonelle Sidle, Mohammad L, MD, 40 mg at 02/27/21 1028   promethazine (PHENERGAN) 12.5 mg in sodium chloride 0.9 % 50 mL IVPB, 12.5 mg, Intravenous, Q4H PRN, Arrien, Jimmy Picket, MD   rOPINIRole (REQUIP) tablet 1 mg, 1 mg, Oral, QHS, Garba, Mohammad L, MD, 1 mg at 02/26/21 2144   rosuvastatin (CRESTOR) tablet 5 mg, 5 mg, Oral, QPM, Garba, Mohammad L, MD, 5 mg at 02/26/21 1651   Continuous Bladder Irrigation, , , Until Discontinued **AND** sodium chloride irrigation 0.9 % 3,000 mL, 3,000 mL, Irrigation, Continuous, [name], MD, 3,000 mL at 02/27/21 1216   sucralfate (CARAFATE) tablet 1 g, 1 g, Oral, TID WC & HS, Arrien, Jimmy Picket, MD, 1 g at 02/27/21 1218   tamsulosin (FLOMAX) capsule 0.4 mg, 0.4 mg, Oral, QPM, Garba, Mohammad L, MD, 0.4 mg at 02/26/21 1651   traMADol (ULTRAM) tablet 100 mg, 100 mg, Oral, Q6H PRN, [name], MD, 100 mg at 02/27/21 [facility] Events: Including procedures, antibiotic start and stop dates in addition to other pertinent events   8/26 - uro procedure for Left UPJ 02/19/21 - eR visit for clot 02/23/2021 - admit 9/11 - ccm consult  Interim History / Subjective:  x  Objective   Blood pressure (!) 83/45, pulse 62, temperature 98.1 F (36.7 C), temperature source Oral, resp. rate 19, height 5' 7"  (1.702 m), weight 72.5 kg, SpO2 96 %.        Intake/Output Summary (Last 24 hours) at 02/27/2021 1509 Last data filed at 02/27/2021 1300 Gross per 24 hour  Intake 6817.37 ml  Output 6875 ml  Net -57.63 ml   Filed Weights   02/23/21 1541  Weight: 72.5 kg    Examination: General: Well-built  male resting comfortably.  He is lying on the side of the bed HENT: Oxygen on.  No elevated JVP Lungs: No distress clear to auscultation Cardiovascular: Atrial fibrillation normal heart sounds Abdomen: Soft and distended Extremities: No cyanosis no clubbing no edema.  Cool Neuro: Reported as confused but no sleeping.  Wife at the bedside GU: Foley catheter present but not examined in detail  Resolved Hospital Problem list   x  Assessment & Plan:     History of 64 pack smoking -with at risk for COPD bu not mentioned in 2015 CT ? Hx of OSA - Prior to & Present on Admit Acute resp failure - new onset 02/25/21 and getting worse 02/27/21  - associatd with upper lobe infoiltrates - Rx with lasix  - DDx septic pneumonia v  ALI v Acute systolic chf  7/85/8850 ->  on 12L [place] and compensated work of breathing  P:   Oxygen for pulse ox goal greater than 32% Elicit of underlying sleep apnea at home Consider BiPAP at night if respiratory failure gets worse Certainly intubate if significantly worse   :   On Wellbutrin, Neurontin, Requip, tramadol -chronic pain and ?  Indication = Prior to & Present on Admit Acute metabolic encephalopathy -new onset 02/26/2021.  Probably due to sepsis  P:   Reduce gabapentin home dose from 3 mg 3 times daily to 100 mg 3 times daily given renal injyr Discontinue tramadol Home medication being given as needed Reduce Wellbutrin XL from 3 mg once daily to 150 mg once daily given renal injury Might have to gradually stop some of his home CNS medications Monitor Frequent orientation Consider Precedex if needed Check ammonia    Coronary artery disease status post MI status post stent on Plavix and aspirin - Prior to & Present on Admit  Chronic systolic failure due to ischemic cardiomyopathy-agent fraction was 45% but normalized in 2015 to 55% - Prior to & Present on Admit  Chronic atrial fibrillation on Xarelto and Cardizem - Prior to & Present on  Admit  Chronic pericardial effusion documented in 2015 CT chest and 2021 echo - Prior to & Present on Admit  - 02/27/21 -dealing with rapid ventricular rate on Cardizem since post admission.  Also concern for acute on chronic systolic heart failure  P: Diuresis per primary team Get urgent echo Troponin per primary team DAPT on hold duee to hematura   Klebsiella ESBL UTI at admission with severe sepsis  02/23/2021: Afebrile x48 hours  P:   Meropenem per the hospitalist   RENAL A:  Obstructive uropathy with acute kidney injury -present on admission  -Urology following.  Suspect blood clots.  On continuous bladder irrigation   9/11 -creatinine improved but getting worse.  Could be due to obstructive uropathy from clots  P:  Renal ultrasound 02/27/2021 Management per hospitalist and urology Ongoing continuous bladder irrigation   A:  Hyponatremia present on admission  9/11 -low potassium repleted by [facility]  P: Monitor and replete    A:   History of [place] without ascites.  Ammonia normal 02/26/2021  P:   Get right upper quadrant ultrasound Check ammonia 02/28/2021 Consider lactulose if encephalopathy persists   A:  Hemorrhagic anemia -from hematuria.  Present on admit.  Admission hemoglobin 8.8 g%.  Status post PRBC for hemoglobin 6.8 g% on 02/23/2021   P:  - PRBC for hgb </= 6.9gm%    - exceptions are   -  if ACS susepcted/confirmed then transfuse for hgb </= 8.0gm%,  or    -  active bleeding with hemodynamic instability, then transfuse regardless of hemoglobin value   At at all times try to transfuse 1 unit prbc as possible with exception of active hemorrhage    A New onset thrombocytopenia 02/24/2021  9/11 -slowly improving  P No anticoagulation because of hematuria   A:   Significant diabetes P:   SSI    Best Practice (right click and "Reselect all SmartList Selections" daily)   Diet/type: clear liquids and NPO w/ oral meds DVT prophylaxis: not  indicated due to hematurai GI prophylaxis: H2B Lines: N/A Foley:  Yes, and it is still needed Code Status:  full code Last date of multidisciplinary goals of care discussion [02/27/2021 with a wife, hospitalist at the bedside and bedside nurse-full code]     ATTESTATION & SIGNATURE  The patient Kristopher [name] is critically ill with multiple organ systems failure and requires high complexity decision making for assessment and support, frequent evaluation and titration of therapies, application of advanced monitoring technologies and extensive interpretation of multiple databases.   Critical Care Time devoted to patient care services described in this note is  60  Minutes. This time reflects time of care of this signee Dr Brand Males. This critical care time does not reflect procedure time, or teaching time or supervisory time of PA/NP/Med student/Med Resident etc but could involve care discussion time     Dr. Brand Males, M.D., [facility].C.P Pulmonary and Critical Care Medicine Staff Physician [place] [facility] Pager: [phone], If no answer or between  15:00h - 7:00h: call [phone]  02/27/2021 3:09 PM    LABS    PULMONARY Recent Labs  Lab 02/26/21 1315  PHART 7.381  PCO2ART 24.4*  PO2ART 159*  HCO3 14.1*  O2SAT 99.0    CBC Recent Labs  Lab 02/25/21 1718 02/26/21 0933 02/27/21 0242  HGB 8.8* 9.4* 9.0*  HCT 25.7* 27.6* 26.4*  WBC 6.0 8.4 8.2  PLT 118* 139* 121*    COAGULATION Recent Labs  Lab 02/23/21 1605 02/24/21 0501  INR 1.2 1.2    CARDIAC  No results for input(s): TROPONINI in the last 168 hours. No results for input(s): PROBNP in the last 168 hours.   CHEMISTRY Recent Labs  Lab 02/23/21 1605 02/24/21 0501 02/25/21 0332 02/26/21 0344 02/26/21 0933 02/27/21 0242  NA 136 136 128*  --  132* 130*  K 4.1 4.3 3.6  --  3.4* 3.2*  CL 101 101 96*  --  102 97*  CO2 24 26 21*  --  16* 17*  GLUCOSE  235* 228* 177*  --  203* 231*  BUN 35* 35* 35*  --  40* 40*  CREATININE 2.11* 1.99* 1.59* 1.87* 1.93* 2.16*  CALCIUM 8.1* 8.0* 7.1*  --  7.6* 7.4*   Estimated Creatinine Clearance: 30.6 mL/min (A) (by C-G formula based on SCr of 2.16 mg/dL (H)).   LIVER Recent Labs  Lab 02/23/21 1605 02/24/21 0501  AST 113* 174*  ALT 27 47*  ALKPHOS 65 62  BILITOT 1.4* 1.4*  PROT 6.1* 5.9*  ALBUMIN 3.3* 2.8*  INR 1.2 1.2     INFECTIOUS Recent Labs  Lab 02/23/21 1605 02/23/21 1819 02/24/21 0501  LATICACIDVEN 1.2 1.4  --   PROCALCITON  --   --  34.60     ENDOCRINE CBG (last 3)  Recent Labs    02/26/21 2133 02/27/21 0744 02/27/21 1141  GLUCAP 241* 256* 212*         IMAGING x48h  - image(s) personally visualized  -   highlighted in bold MR BRAIN WO CONTRAST  Result Date: 02/26/2021 CLINICAL DATA:  Initial evaluation for neuro deficit, stroke suspected. EXAM: MRI HEAD WITHOUT CONTRAST TECHNIQUE: Multiplanar, multiecho pulse sequences of the brain and surrounding structures were obtained without intravenous contrast. COMPARISON:  Prior CT from earlier the same day. FINDINGS: Brain: A limited stroke protocol consisting of axial DWI and FLAIR sequences only was performed. Diffusion-weighted imaging demonstrates no convincing foci of restricted diffusion to suggest acute or subacute ischemia. Underlying age-related cerebral atrophy. Patchy confluent T2/FLAIR hyperintensity involving the periventricular deep white matter both cerebral hemispheres most consistent with chronic small vessel ischemic disease, moderate in nature. Chronic lacunar infarct present at the anterior left frontal centrum semi ovale.  No visible mass lesion, mass effect or midline shift. No hydrocephalus or extra-axial fluid collection. No secondary signs for acute intracranial hemorrhage. Vascular: Major intracranial vascular flow voids are grossly maintained at the skull base. Skull and upper cervical spine: No visible  focal marrow replacing lesion. Scalp soft tissues demonstrate no acute finding. Sinuses/Orbits: Globes and orbital soft tissues grossly within normal limits. Paranasal sinuses are largely clear on this limited exam. Trace left with small to moderate right mastoid effusion noted. Visualized nasopharynx grossly unremarkable. Other: None. IMPRESSION: 1. Limited stroke protocol MRI only was performed. 2. No acute intracranial infarct. No other definite acute intracranial abnormality on this limited exam. 3. Underlying age-related cerebral atrophy with moderate chronic small vessel ischemic disease. Remote lacunar infarct at the left frontal centrum semi ovale. Electronically Signed   By: Jeannine Boga M.D.   On: 02/26/2021 22:07   DG CHEST PORT 1 VIEW  Result Date: 02/26/2021 CLINICAL DATA:  Dyspnea. EXAM: PORTABLE CHEST 1 VIEW COMPARISON:  02/23/2021 FINDINGS: Interval extensive patchy opacity in the right mid, upper and lower lung zones medially. Mild patchy opacity in the scratch the interval mild patchy opacity in the left upper lung zone. Normal sized heart. No pleural fluid. Thoracic spine degenerative changes. Lower cervical spine fixation hardware. Left chest loop recorder. IMPRESSION: Bilateral patchy airspace opacity in the lungs, significantly greater on the right compared to the left. This is concerning for bilateral, multilobar pneumonia. Alveolar edema is less likely in the absence of vascular congestion or cardiomegaly. Electronically Signed   By: Claudie Revering M.D.   On: 02/26/2021 14:55   CT HEAD CODE STROKE WO CONTRAST  Result Date: 02/26/2021 CLINICAL DATA:  Code stroke.  Neuro deficit, acute, stroke suspected EXAM: CT HEAD WITHOUT CONTRAST TECHNIQUE: Contiguous axial images were obtained from the base of the skull through the vertex without intravenous contrast. COMPARISON:  CT 02/23/2021. FINDINGS: Brain: No evidence of acute large vascular territory infarction, hemorrhage,  hydrocephalus, extra-axial collection or mass lesion/mass effect. Similar patchy white matter hypodensities, nonspecific but compatible with chronic microvascular ischemic disease. Vascular: No hyperdense vessel identified. Skull: Normal. Negative for fracture or focal lesion. Sinuses/Orbits: Largely clear sinuses. Other: No mastoid effusions. ASPECTS [facility] Stroke Program Early CT Score) total score (0-10 with 10 being normal): 10. IMPRESSION: 1. No evidence of acute large vascular territory infarct or acute hemorrhage. ASPECTS is 10. 2. Chronic microvascular ischemic disease. Code stroke imaging results were communicated on 02/26/2021 at 6:22 pm to provider Dr. Cathlean Sauer via telephone, who verbally acknowledged these results. Electronically Signed   By: Margaretha Sheffield M.D.   On: 02/26/2021 18:24

## 2021-02-27 NOTE — Progress Notes (Addendum)
Patient converted to sinus rhythm during this morning after metoprolol po 50 mg, but this afternoon is back on atrial fibrillation. Patient will likely need antiarrhythmic therapy.  Follow chest film with bilateral infiltrates, possible ARSD due to sepsis.  Patient is somnolent, his lungs have rales, heart S1 and S2 irregularly irregular, tachycardic, his abdomen is distended and tympanic, cold lower extremities, no edema.   Plan. Add 60 mg IV furosemide  Check echocardiogram.  Check renal US Continue supplemental 02 per [place] to keep 02 saturation more than 92%.  Patient with very high risk for acute decompensation. Discussed with Dr Chase Caller form PCCM.

## 2021-02-27 NOTE — Progress Notes (Addendum)
   Spoke w/ Dr Cathlean Sauer, case discussed w/ Dr Caryl Comes  Cards will see in am for recurrent atrial fib in the setting of severe sepsis w/ ARDS.  For now, will start amiodarone, not necessarily a long-term med. Ck TSH and LFTs.  Rosaria Ferries, PA-C 02/27/2021 4:30 PM

## 2021-02-28 ENCOUNTER — Inpatient Hospital Stay (HOSPITAL_COMMUNITY): Payer: Medicare HMO

## 2021-02-28 ENCOUNTER — Encounter (HOSPITAL_COMMUNITY): Payer: Self-pay | Admitting: Internal Medicine

## 2021-02-28 DIAGNOSIS — A4181 Sepsis due to Enterococcus: Secondary | ICD-10-CM

## 2021-02-28 DIAGNOSIS — I4891 Unspecified atrial fibrillation: Secondary | ICD-10-CM | POA: Diagnosis not present

## 2021-02-28 DIAGNOSIS — A419 Sepsis, unspecified organism: Secondary | ICD-10-CM

## 2021-02-28 DIAGNOSIS — R6520 Severe sepsis without septic shock: Secondary | ICD-10-CM

## 2021-02-28 DIAGNOSIS — N179 Acute kidney failure, unspecified: Secondary | ICD-10-CM

## 2021-02-28 DIAGNOSIS — Z960 Presence of urogenital implants: Secondary | ICD-10-CM

## 2021-02-28 DIAGNOSIS — I2510 Atherosclerotic heart disease of native coronary artery without angina pectoris: Secondary | ICD-10-CM

## 2021-02-28 DIAGNOSIS — R0609 Other forms of dyspnea: Secondary | ICD-10-CM

## 2021-02-28 DIAGNOSIS — D649 Anemia, unspecified: Secondary | ICD-10-CM

## 2021-02-28 DIAGNOSIS — J810 Acute pulmonary edema: Secondary | ICD-10-CM

## 2021-02-28 DIAGNOSIS — D696 Thrombocytopenia, unspecified: Secondary | ICD-10-CM

## 2021-02-28 DIAGNOSIS — E119 Type 2 diabetes mellitus without complications: Secondary | ICD-10-CM

## 2021-02-28 DIAGNOSIS — N200 Calculus of kidney: Secondary | ICD-10-CM

## 2021-02-28 DIAGNOSIS — A498 Other bacterial infections of unspecified site: Secondary | ICD-10-CM

## 2021-02-28 DIAGNOSIS — Z1612 Extended spectrum beta lactamase (ESBL) resistance: Secondary | ICD-10-CM

## 2021-02-28 DIAGNOSIS — K7460 Unspecified cirrhosis of liver: Secondary | ICD-10-CM

## 2021-02-28 DIAGNOSIS — N390 Urinary tract infection, site not specified: Secondary | ICD-10-CM

## 2021-02-28 DIAGNOSIS — R319 Hematuria, unspecified: Secondary | ICD-10-CM

## 2021-02-28 DIAGNOSIS — A499 Bacterial infection, unspecified: Secondary | ICD-10-CM

## 2021-02-28 LAB — TYPE AND SCREEN
ABO/RH(D): A POS
Antibody Screen: NEGATIVE
Unit division: 0
Unit division: 0
Unit division: 0

## 2021-02-28 LAB — CBC WITH DIFFERENTIAL/PLATELET
Abs Immature Granulocytes: 0.13 10*3/uL — ABNORMAL HIGH (ref 0.00–0.07)
Basophils Absolute: 0 10*3/uL (ref 0.0–0.1)
Basophils Relative: 0 %
Eosinophils Absolute: 0 10*3/uL (ref 0.0–0.5)
Eosinophils Relative: 0 %
HCT: 30.4 % — ABNORMAL LOW (ref 39.0–52.0)
Hemoglobin: 10.4 g/dL — ABNORMAL LOW (ref 13.0–17.0)
Immature Granulocytes: 1 %
Lymphocytes Relative: 4 %
Lymphs Abs: 0.5 10*3/uL — ABNORMAL LOW (ref 0.7–4.0)
MCH: 30.7 pg (ref 26.0–34.0)
MCHC: 34.2 g/dL (ref 30.0–36.0)
MCV: 89.7 fL (ref 80.0–100.0)
Monocytes Absolute: 0.9 10*3/uL (ref 0.1–1.0)
Monocytes Relative: 8 %
Neutro Abs: 10 10*3/uL — ABNORMAL HIGH (ref 1.7–7.7)
Neutrophils Relative %: 87 %
Platelets: 181 10*3/uL (ref 150–400)
RBC: 3.39 MIL/uL — ABNORMAL LOW (ref 4.22–5.81)
RDW: 17 % — ABNORMAL HIGH (ref 11.5–15.5)
WBC: 11.5 10*3/uL — ABNORMAL HIGH (ref 4.0–10.5)
nRBC: 0 % (ref 0.0–0.2)

## 2021-02-28 LAB — GLUCOSE, CAPILLARY
Glucose-Capillary: 206 mg/dL — ABNORMAL HIGH (ref 70–99)
Glucose-Capillary: 259 mg/dL — ABNORMAL HIGH (ref 70–99)
Glucose-Capillary: 218 mg/dL — ABNORMAL HIGH (ref 70–99)
Glucose-Capillary: 220 mg/dL — ABNORMAL HIGH (ref 70–99)
Glucose-Capillary: 175 mg/dL — ABNORMAL HIGH (ref 70–99)

## 2021-02-28 LAB — ECHOCARDIOGRAM COMPLETE
Area-P 1/2: 4.6 cm2
Calc EF: 47 %
Height: 67 in
S' Lateral: 3.9 cm
Single Plane A2C EF: -0.1 %
Single Plane A4C EF: 69.6 %
Weight: 2557.34 oz

## 2021-02-28 LAB — BASIC METABOLIC PANEL
Anion gap: 19 — ABNORMAL HIGH (ref 5–15)
BUN: 40 mg/dL — ABNORMAL HIGH (ref 8–23)
CO2: 17 mmol/L — ABNORMAL LOW (ref 22–32)
Calcium: 8.1 mg/dL — ABNORMAL LOW (ref 8.9–10.3)
Chloride: 96 mmol/L — ABNORMAL LOW (ref 98–111)
Creatinine, Ser: 2.03 mg/dL — ABNORMAL HIGH (ref 0.61–1.24)
GFR, Estimated: 35 mL/min — ABNORMAL LOW (ref >60)
Glucose, Bld: 269 mg/dL — ABNORMAL HIGH (ref 70–99)
Potassium: 3.3 mmol/L — ABNORMAL LOW (ref 3.5–5.1)
Sodium: 132 mmol/L — ABNORMAL LOW (ref 135–145)

## 2021-02-28 LAB — BPAM RBC
Blood Product Expiration Date: 202210052359
Blood Product Expiration Date: 202210072359
Blood Product Expiration Date: 202210082359
ISSUE DATE / TIME: 202209071841
ISSUE DATE / TIME: 202209080106
ISSUE DATE / TIME: 202209091110
Unit Type and Rh: 6200
Unit Type and Rh: 6200
Unit Type and Rh: 6200

## 2021-02-28 LAB — MAGNESIUM: Magnesium: 1.8 mg/dL (ref 1.7–2.4)

## 2021-02-28 LAB — TSH: TSH: 2.473 u[IU]/mL (ref 0.350–4.500)

## 2021-02-28 LAB — TROPONIN I (HIGH SENSITIVITY): Troponin I (High Sensitivity): 11 ng/L (ref <18)

## 2021-02-28 LAB — AMMONIA: Ammonia: 11 umol/L (ref 9–35)

## 2021-02-28 LAB — BRAIN NATRIURETIC PEPTIDE: B Natriuretic Peptide: 472.6 pg/mL — ABNORMAL HIGH (ref 0.0–100.0)

## 2021-02-28 MED ORDER — POTASSIUM CHLORIDE 10 MEQ/100ML IV SOLN
10.0000 meq | INTRAVENOUS | Status: AC
  Administered 2021-02-28 (×4): 10 meq via INTRAVENOUS
  Filled 2021-02-28 (×4): qty 100

## 2021-02-28 MED ORDER — TAB-A-VITE/IRON PO TABS
1.0000 | ORAL_TABLET | Freq: Every day | ORAL | Status: DC
  Administered 2021-03-01 – 2021-03-04 (×4): 1 via ORAL
  Filled 2021-02-28 (×5): qty 1

## 2021-02-28 MED ORDER — POTASSIUM CHLORIDE CRYS ER 20 MEQ PO TBCR: 40.0000 meq | EXTENDED_RELEASE_TABLET | Freq: Once | ORAL | Status: DC

## 2021-02-28 MED ORDER — SODIUM CHLORIDE 0.9 % IV SOLN
INTRAVENOUS | Status: DC | PRN
  Administered 2021-02-28 – 2021-03-03 (×2): 250 mL via INTRAVENOUS

## 2021-02-28 MED ORDER — FUROSEMIDE 10 MG/ML IJ SOLN
40.0000 mg | Freq: Once | INTRAMUSCULAR | Status: AC
  Administered 2021-02-28: 40 mg via INTRAVENOUS
  Filled 2021-02-28: qty 4

## 2021-02-28 MED ORDER — MAGNESIUM SULFATE 2 GM/50ML IV SOLN
2.0000 g | Freq: Once | INTRAVENOUS | Status: AC
  Administered 2021-02-28: 2 g via INTRAVENOUS
  Filled 2021-02-28: qty 50

## 2021-02-28 MED ORDER — THIAMINE HCL 100 MG PO TABS
100.0000 mg | ORAL_TABLET | Freq: Every day | ORAL | Status: DC
  Administered 2021-02-28 – 2021-03-04 (×5): 100 mg via ORAL
  Filled 2021-02-28 (×5): qty 1

## 2021-02-28 MED ORDER — PERFLUTREN LIPID MICROSPHERE
1.0000 mL | INTRAVENOUS | Status: AC | PRN
  Administered 2021-02-28: 2 mL via INTRAVENOUS
  Filled 2021-02-28: qty 10

## 2021-02-28 MED ORDER — INSULIN GLARGINE-YFGN 100 UNIT/ML ~~LOC~~ SOLN
8.0000 [IU] | Freq: Every day | SUBCUTANEOUS | Status: DC
  Administered 2021-02-28: 8 [IU] via SUBCUTANEOUS
  Filled 2021-02-28 (×2): qty 0.08

## 2021-02-28 MED ORDER — PROSOURCE PLUS PO LIQD
30.0000 mL | Freq: Two times a day (BID) | ORAL | Status: DC
  Administered 2021-03-01 (×2): 30 mL via ORAL
  Filled 2021-02-28 (×5): qty 30

## 2021-02-28 NOTE — Progress Notes (Signed)
PROGRESS NOTE    Kristopher [name]  KXF:[id number] DOB: September 05, 1950 DOA: 02/23/2021 PCP: Townsend Roger, MD    Brief Narrative:  [name] was admitted to the hospital working diagnosis of severe sepsis due to urinary tract infection, Klebsiella bacteremia, ESBL, Hospitalization complicated with atrial fibrillation with RVR and acute heart failure.    70 year old male past medical history for atrial fibrillation, coronary artery disease, nonalcoholic cirrhosis, dyslipidemia, type 2 diabetes mellitus, and urolithiasis who presented with fevers, chills and hematuria. 8/26 patient underwent cystoscopy, left retrograde pyelogram with interpretation, left percutaneous renal access, left nephrolithotomy, left nephrostogram, left ureteral stent. Postprocedure he had a Foley catheter placed for urinary retention. On 9/3 patient returned to the ED because urinary retention.  Apparently his Foley catheter was blocked by blood clot at home and he removed it himself.  Foley catheter was replaced the patient was discharged home. Patient continued to have fevers, chills along with back pain at home that prompted him to come back to the hospital.  On his initial physical examination his temperature was 103 F, blood pressure 87/59, heart rate 155, respiratory 28, oxygen saturation 72% on room air.  He was awake and alert, dry mucous membranes, lungs clear to auscultation bilaterally, heart S1-S2, tachycardic, abdomen soft nontender.   Sodium 136, potassium 4.1, chloride 101, bicarb 24, glucose 235, BUN 35, creatinine 2.1, AST 113, ALT 27, high sensitive troponin 39, lactic acid 1.2, white count 9.0, hemoglobin 6.8, hematocrit 20.1, platelets 125. SARS COVID-19 negative.   Urinalysis > 50 white cells, > 50 red cells.  Brown color, turbid.   CT of the abdomen pelvis left double-J ureteral stent in place.  No hydroureter.  Several mildly dilated loops of small bowel in the upper quadrant, no obvious transition to indicate  obstruction.   Chest radiograph no infiltrates.   EKG 107 bpm, normal axis, normal intervals, sinus rhythm, no significant ST segment changes, negative T waves V4-V6.   Patient was placed on antibiotic therapy and IV fluids.   Culture positive for multidrug resistant Klebsiella.   09/09 patient developed atrial fibrillation with RVR, placed on diltiazem infusion, converting to sinus rhythm.  PRBC transfusion for acute blood loss anemia due to hematuria.    Patient transferred to step down unit on 09/10, because atrial fibrillation with RVR along with altered mental status. CVA work up with heat CT and brain MRI negative.    09/11 difficult to control atrial fibrillation, positive signs of hypoperfusion. Patient started ton amiodarone infusion.    Assessment & Plan:   Principal Problem:   Severe Sepsis secondary to UTI [facility]) Active Problems:   Coronary artery disease involving native coronary artery of native heart without angina pectoris   Atrial fibrillation s/p PVI ablation at [facility] 2001   Obstructive sleep apnea syndrome   Cirrhosis of liver without ascites (HCC)   Type 2 diabetes mellitus without complications (HCC)   AKI (acute kidney injury) (HCC)   Anemia   Thrombocytopenia (HCC)   Sepsis with acute renal failure without septic shock (HCC)   Severe sepsis due to urinary tract infection (complicated) in the setting of indwelling foley catheter. (Gram negative bacteremia). Suspected ARDS acute hypoxemic respiratory failure.  Urine culture and blood culture positive for Klebsiella ESBL. CT with no evident abscess.    Continue to be afebrile, wbc is 11.5.    Antibiotic therapy with meropenem #5 On continuous bladder irrigation, urine more clear today.  Oxygenation this am 02 saturation is 98% on 10  L min per HFNC His blood pressure has improved and his lower extremities are not longer cold.    Pulmonary infiltrates possible ARDS vs pulmonary edema, also consideration  for transfusion related acute lung injury.    2. Acute blood loss anemia due to hematuria. thrombocytopenia Sp 2 units PRBC transfusion.  Urine more clear today, hgb continue to be stable at 10,4, and hct at 30.4. Plt 181 Follow cell count in am.    3. AKI hyponatremia/ hypokalemia/ hypomagnesemia/ anion gap metabolic acidosis.  Renal function with serum cr at 2,0 with Na at 132, K 3,3 and bicarbonate at 17, Mg is 1.8. anion gap 19.   Continue K correction with 40 meq Kcl and Mg with 2 g of mag sulfate.  Follow renal function in am, avoid hypotension and nephrotoxic medications.   Hold on furosemide for now.    4. Atrial fibrillation (sp ablation in the past) RVR with acute diastolic heart failure and acute hypoxemic respiratory failure .  Patient now back on sinus rhythm, blood pressure has improved.  Plan to continue with amiodarone and metoprolol.  Wean off diltiazem per protocol. Follow up on echocardiogram.  No anticoagulation due to hematuria and acute blood loss anemia.    5. New metabolic encephalopathy. Work up with head CT and brain MRI negative for CVA,   Continue with low dose of gabapentin, neuro checks per unit protocol. Consult nutrition.  Add thiamine and multivitamins.    5. T2DM/ dyslipidemia.  Persistent hyperglycemia, this am fasting glucose 269.  Yesterday used 16 units of rapid acting insulin from sliding scale.  Plan to add 8 units of glargine today and will continue with sliding scale for glucose cover and monitoring.    6. Acute gastritis.  On pantoprazole and sucralfate.    Patient continue to be at high risk for worsening sepsis.   Status is: Inpatient  Remains inpatient appropriate because:IV treatments appropriate due to intensity of illness or inability to take PO  Dispo: The patient is from: Home              Anticipated d/c is to: Home              Patient currently is not medically stable to d/c.   Difficult to place patient No   DVT  prophylaxis: Scd   Code Status:    full  Family Communication:  No family at the bedside      Nutrition Status: Nutrition Problem: Increased nutrient needs Etiology: acute illness (sepsis) Signs/Symptoms: estimated needs Interventions: Ensure Enlive (each supplement provides 350kcal and 20 grams of protein), MVI     Skin Documentation:     Consultants:  Urology PCCM Cardiology    Antimicrobials:  meropenem    Subjective: Patient continue to be very weak and deconditioned, positive dyspnea, this am with no nausea or vomiting, positive back pain.   Objective: Vitals:   02/28/21 0600 02/28/21 0615 02/28/21 0630 02/28/21 0645  BP: (!) 130/54 (!) 109/53 128/60 (!) 115/53  Pulse: 80 79 82 82  Resp:      Temp:      TempSrc:      SpO2: 99% 96% 96% 93%  Weight:      Height:        Intake/Output Summary (Last 24 hours) at 02/28/2021 0821 Last data filed at 02/28/2021 0800 Gross per 24 hour  Intake 13034.01 ml  Output 17825 ml  Net -4790.99 ml   Filed Weights   02/23/21 1541  Weight: 72.5 kg    Examination:   General: mild dyspnea, deconditioned  Neurology: this am sleeping but easy to arouse, responds to questions and follows commands.  E ENT: mild pallor, no icterus, oral mucosa moist Cardiovascular: No JVD. S1-S2 present, rhythmic, no gallops, rubs, or murmurs. No lower extremity edema. Pulmonary: positive breath sounds bilaterally,with no wheezing, or rhonchi, scattered rales. Gastrointestinal. Abdomen mild distended but not tender Skin. No rashes Musculoskeletal: no joint deformities     Data Reviewed: I have personally reviewed following labs and imaging studies  CBC: Recent Labs  Lab 02/23/21 1605 02/24/21 0501 02/25/21 0332 02/25/21 1718 02/26/21 0933 02/27/21 0242 02/28/21 0254  WBC 9.0   < > 4.2 6.0 8.4 8.2 11.5*  NEUTROABS 8.4*  --  3.8  --   --  7.0 10.0*  HGB 6.8*   < > 7.6* 8.8* 9.4* 9.0* 10.4*  HCT 20.1*   < > 22.2* 25.7* 27.6*  26.4* 30.4*  MCV 95.3   < > 91.0 90.2 89.3 90.1 89.7  PLT 125*   < > 93* 118* 139* 121* 181   < > = values in this interval not displayed.   Basic Metabolic Panel: Recent Labs  Lab 02/24/21 0501 02/25/21 0332 02/26/21 0344 02/26/21 0933 02/27/21 0242 02/27/21 1435 02/28/21 0254  NA 136 128*  --  132* 130*  --  132*  K 4.3 3.6  --  3.4* 3.2*  --  3.3*  CL 101 96*  --  102 97*  --  96*  CO2 26 21*  --  16* 17*  --  17*  GLUCOSE 228* 177*  --  203* 231*  --  269*  BUN 35* 35*  --  40* 40*  --  40*  CREATININE 1.99* 1.59* 1.87* 1.93* 2.16*  --  2.03*  CALCIUM 8.0* 7.1*  --  7.6* 7.4*  --  8.1*  MG  --   --   --   --   --  1.9 1.8  PHOS  --   --   --   --  2.2*  --   --    GFR: Estimated Creatinine Clearance: 32.6 mL/min (A) (by C-G formula based on SCr of 2.03 mg/dL (H)). Liver Function Tests: Recent Labs  Lab 02/23/21 1605 02/24/21 0501 02/27/21 1658  AST 113* 174* 38  ALT 27 47* 30  ALKPHOS 65 62 66  BILITOT 1.4* 1.4* 1.2  PROT 6.1* 5.9* 5.8*  ALBUMIN 3.3* 2.8* 2.3*   No results for input(s): LIPASE, AMYLASE in the last 168 hours. Recent Labs  Lab 02/26/21 1652 02/28/21 0254  AMMONIA 25 11   Coagulation Profile: Recent Labs  Lab 02/23/21 1605 02/24/21 0501  INR 1.2 1.2   Cardiac Enzymes: No results for input(s): CKTOTAL, CKMB, CKMBINDEX, TROPONINI in the last 168 hours. BNP (last 3 results) No results for input(s): PROBNP in the last 8760 hours. HbA1C: No results for input(s): HGBA1C in the last 72 hours. CBG: Recent Labs  Lab 02/26/21 2133 02/27/21 0744 02/27/21 1141 02/27/21 1644 02/28/21 0738  GLUCAP 241* 256* 212* 178* 259*   Lipid Profile: No results for input(s): CHOL, HDL, LDLCALC, TRIG, CHOLHDL, LDLDIRECT in the last 72 hours. Thyroid Function Tests: Recent Labs    02/27/21 1706  TSH 2.473   Anemia Panel: No results for input(s): VITAMINB12, FOLATE, FERRITIN, TIBC, IRON, RETICCTPCT in the last 72 hours.    Radiology Studies: I  have reviewed all of the imaging during this hospital visit  personally     Scheduled Meds:  sodium chloride   Intravenous Once   sodium chloride   Intravenous Once   baclofen  5 mg Oral TID   buPROPion  150 mg Oral Daily   Chlorhexidine Gluconate Cloth  6 each Topical Daily   feeding supplement  237 mL Oral Q24H   gabapentin  100 mg Oral TID   insulin aspart  0-15 Units Subcutaneous TID WC   insulin aspart  0-5 Units Subcutaneous QHS   mouth rinse  15 mL Mouth Rinse BID   metoprolol tartrate  50 mg Oral BID   multivitamin with minerals  1 tablet Oral Daily   niacin  500 mg Oral Daily   pantoprazole  40 mg Oral BID   rOPINIRole  1 mg Oral QHS   rosuvastatin  5 mg Oral QPM   sucralfate  1 g Oral TID WC & HS   tamsulosin  0.4 mg Oral QPM   Continuous Infusions:  sodium chloride 1,000 mL (02/28/21 0412)   amiodarone 30 mg/hr (02/28/21 0413)   diltiazem (CARDIZEM) infusion 12.5 mg/hr (02/28/21 0300)   meropenem (MERREM) IV Stopped (02/27/21 2352)   promethazine (PHENERGAN) injection (IM or IVPB) Stopped (02/27/21 1619)   sodium chloride irrigation       LOS: 5 days        Misty Rago Gerome Apley, MD

## 2021-02-28 NOTE — Consult Note (Addendum)
Date of Admission:  02/23/2021          Reason for Consult: Complicated UTI with Klebsiella pneumonia ESBL bacteremia    Referring Provider: Sander Radon, MD   Assessment:  Complicated urinary tract infection with ESBL Klebsiella pneumonia and Enterococcus with Klebsiella having seeded the bloodstream with sepsis and endorgan failure Recent cystoscopy on August 26 with nephrolithotomy of large kidney stone and left ureteral stent placement Hypoxic respiratory failure with bilateral infiltrates suspect due to pulmonary edema  Encephalopathy ARF Urinary Retnention NASH with cirrhosis Atrial fibrillation status post ablation Type 2 diabetes mellitus CAD Thrombocytopenia Encephalopathy  Plan:  Check blood cultures  continue meropenem and complete at least 7 days --I would then stop and observe off antibiotics unless Urology feels he needs protracted therapy (we do not have oral options for that Screen for HCV, HBV   Principal Problem:   Severe Sepsis secondary to UTI [facility]) Active Problems:   Coronary artery disease involving native coronary artery of native heart without angina pectoris   Atrial fibrillation s/p PVI ablation at [facility] 2001   Obstructive sleep apnea syndrome   Cirrhosis of liver without ascites ([place])   Type 2 diabetes mellitus without complications (HCC)   AKI (acute kidney injury) ([place])   Anemia   Thrombocytopenia (HCC)   Sepsis with acute renal failure without septic shock (HCC)   Scheduled Meds:  sodium chloride   Intravenous Once   sodium chloride   Intravenous Once   baclofen  5 mg Oral TID   buPROPion  150 mg Oral Daily   Chlorhexidine Gluconate Cloth  6 each Topical Daily   feeding supplement  237 mL Oral Q24H   gabapentin  100 mg Oral TID   insulin aspart  0-15 Units Subcutaneous TID WC   insulin aspart  0-5 Units Subcutaneous QHS   insulin glargine-yfgn  8 Units Subcutaneous Daily   mouth rinse  15 mL Mouth Rinse BID   metoprolol  tartrate  50 mg Oral BID   multivitamins with iron  1 tablet Oral Daily   niacin  500 mg Oral Daily   pantoprazole  40 mg Oral BID   rOPINIRole  1 mg Oral QHS   rosuvastatin  5 mg Oral QPM   sucralfate  1 g Oral TID WC & HS   tamsulosin  0.4 mg Oral QPM   thiamine  100 mg Oral Daily   Continuous Infusions:  sodium chloride 10 mL/hr at 02/28/21 1000   sodium chloride Stopped (02/28/21 0946)   amiodarone 30 mg/hr (02/28/21 1000)   diltiazem (CARDIZEM) infusion 5 mg/hr (02/28/21 1051)   meropenem (MERREM) IV 1 g (02/28/21 1017)   potassium chloride 10 mEq (02/28/21 1049)   promethazine (PHENERGAN) injection (IM or IVPB) Stopped (02/28/21 1023)   sodium chloride irrigation     PRN Meds:.sodium chloride, sodium chloride, metoprolol tartrate, nitroGLYCERIN, ondansetron (ZOFRAN) IV, perflutren lipid microspheres (DEFINITY) IV suspension, promethazine (PHENERGAN) injection (IM or IVPB), traMADol  HPI: Kristopher [name] is a 70 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  Was having trouble with urinary retention and was seen in the office with urology and had Foley catheter placed and was started on Flomax.  Later that day the Foley clotted off and  was unable to drain.  He presented to ER with urinary retention and pain.  Foley catheter was replaced in the ER.  CT scan of the abdomen pelvis that showed stent in place.  Return onset on the seventh with onset of fever chills and flank pain.  He is also developed some confusion.  Febrile to 103 and blood pressure to 87/59 and he was tachycardic in the 150s and hypoxic.  Labs are pertinent for hyponatremia and acute renal failure.  Urine cultures were taken as well as blood  cultures and he was started on cefepime.  Blood cultures had ESBL identified by [facility] ID has a Klebsiella pneumonia.  Urine cultures have yielded Klebsiella pneumonia ESBL as well as ampicillin sensitive Enterococcus blood cultures have grown ESBL Klebsiella pneumonia.  He has been switched over to meropenem ertapenem on 8 September remained on this since then.  His fevers have defervesced and he is stabilized in the interim.  Has remained in the ICU at this time.  Is developed some paroxysmal atrial fibrillation is on IV Cardizem and amiodarone.  Had some bilateral opacities on CXR. He now seems to be improving from respiratory status as well now.  He had some encephalopathy in the context of his critical illness.  He still has some significant left flank pain.  Will repeat blood cultures and I would plan on completing 7 days of treatment and then I would prefer to observe him off antibiotics unless urology feels he needs more protracted therapy.     I spent 82 minutes with the patient including than 50% of the time in face to face counseling of the patient and his wife regarding his clinical course with stone extraction followed by problems urinary retention ultimately urosepsis bacteremia Klebsiella pneumonia that is an ESBL, personally reviewing CXR and CT and blood cultures CMP, CBC with differential along with review of medical records in preparation for the visit and during the visit and in coordination of his care.    Review of Systems: Review of Systems  Constitutional:  Positive for chills, fever and malaise/fatigue. Negative for weight loss.  HENT:  Negative for congestion and sore throat.   Eyes:  Negative for blurred vision and photophobia.  Respiratory:  Negative for cough, shortness of breath and wheezing.   Cardiovascular:  Negative for chest pain, palpitations and leg swelling.  Gastrointestinal:  Positive for nausea. Negative for abdominal pain, blood in stool, constipation,  diarrhea, heartburn, melena and vomiting.  Genitourinary:  Positive for dysuria, flank pain and hematuria.  Musculoskeletal:  Positive for myalgias. Negative for back pain, falls and joint pain.  Skin:  Negative for itching and rash.  Neurological:  Positive for weakness. Negative for dizziness, focal weakness, loss of consciousness and headaches.  Endo/Heme/Allergies:  Does not bruise/bleed easily.  Psychiatric/Behavioral:  Negative for depression and suicidal ideas. The patient does not have insomnia.    Past Medical History:  Diagnosis Date   Anemia 12/2020   Anginal pain ([place])    Atrial fibrillation ([place])    a. s/p PVI X2 by Dr Omelia Blackwater at [facility] around 2000.   CAD (coronary artery disease)    a. NSTEMI (06/2013):  LHC (06/27/2013):  pLAD 10-20, RCA 90, EF 45-50%, inf HK.  PCI:  Xience Alpine (3 x 23 mm) DES to the RCA. b. Relook cath 07/10/13 - patent stent. Imdur added. c. Recurrent CP after that - abnormal nuc 07/2017 with chest pain, dyspnea, hypotensive response. Rest images with mid-basal inferior thinning. No  stress images due to abrupt d/c of test. Relook cath 3/12 - no significant r   Chronic back pain greater than 3 months duration    Cirrhosis, non-alcoholic (HCC)    Depression    DJD (degenerative joint disease) of cervical spine    "3 herniated discs" (09/01/2013)   DJD (degenerative joint disease), lumbosacral    "bulging L5-S1" (09/01/2013)   Dysrhythmia    PVCs   Herniated cervical disc    c2-T1 fusion   HLD (hyperlipidemia)    Ischemic cardiomyopathy    a. Echo (06/28/2011): Inferior and inferoseptal akinesis, EF 30%, grade 2 diastolic dysfunction, MAC, small effusion (no tamponade). B. EF 40% by echo 06/2013, cath 07/2013, 55% by cath 08/2013 with subtle mild mid inferior hypocontractility.   Liver cirrhosis secondary to NASH [facility])    NSTEMI (non-ST elevated myocardial infarction) ([place]) 06/27/2013   Osteoarthritis    "neck and lower back" (09/01/2013)   Pericardial effusion     a. Chronic since hx of pericarditis in 2004. b. Mod by echo 06/2013, small by echo 06/2013.   Pneumonia ~ 2005   Type II diabetes mellitus ([place])     Social History   Tobacco Use   Smoking status: Former    Packs/day: 1.50    Years: 43.00    Pack years: 64.50    Types: Cigarettes    Quit date: 05/06/2007    Years since quitting: 13.8   Smokeless tobacco: Never  Vaping Use   Vaping Use: Never used  Substance Use Topics   Alcohol use: Yes    Comment: once monthly/rarely   Drug use: No    Family History  Problem Relation Age of Onset   CAD Father    Sudden death Maternal Grandfather    COPD Other    Cancer Other    Diabetes Other    Heart disease Other    Arrhythmia Neg Hx    Allergies  Allergen Reactions   Codeine Nausea And Vomiting   Fentanyl Nausea And Vomiting   Naproxen Nausea Only   Other Nausea And Vomiting    1. N/V to all opiates. He is able to take versed. (noted 09/29/13) 2.The patient states that he has had  a reaction to all narcotics. He can take them though. (noted 06/26/13)    OBJECTIVE: Blood pressure 123/63, pulse 84, temperature 97.6 F (36.4 C), temperature source Oral, resp. rate 17, height _0  (1.702 m), weight 72.5 kg, SpO2 97 %.  Physical Exam Constitutional:      Appearance: He is well-developed.  HENT:     Head: Normocephalic and atraumatic.  Eyes:     Conjunctiva/sclera: Conjunctivae normal.  Cardiovascular:     Rate and Rhythm: Normal rate and regular rhythm.     Heart sounds: No murmur heard.   No friction rub. No gallop.  Pulmonary:     Effort: Pulmonary effort is normal. No respiratory distress.     Breath sounds: No stridor. No wheezing.  Abdominal:     General: There is no distension.     Palpations: Abdomen is soft. There is no mass.  Musculoskeletal:        General: No tenderness. Normal range of motion.     Cervical back: Normal range of motion and neck supple.  Skin:    General: Skin is warm and dry.      Coloration: Skin is pale.     Findings: No erythema or rash.  Neurological:     General: No  focal deficit present.     Mental Status: He is alert and oriented to person, place, and time.  Psychiatric:        Mood and Affect: Mood normal.        Speech: Speech is delayed.        Behavior: Behavior normal.        Thought Content: Thought content normal.        Cognition and Memory: Memory is impaired.        Judgment: Judgment normal.    Lab Results Lab Results  Component Value Date   WBC 11.5 (H) 02/28/2021   HGB 10.4 (L) 02/28/2021   HCT 30.4 (L) 02/28/2021   MCV 89.7 02/28/2021   PLT 181 02/28/2021    Lab Results  Component Value Date   CREATININE 2.03 (H) 02/28/2021   BUN 40 (H) 02/28/2021   NA 132 (L) 02/28/2021   K 3.3 (L) 02/28/2021   CL 96 (L) 02/28/2021   CO2 17 (L) 02/28/2021    Lab Results  Component Value Date   ALT 30 02/27/2021   AST 38 02/27/2021   ALKPHOS 66 02/27/2021   BILITOT 1.2 02/27/2021     Microbiology: Recent Results (from the past 240 hour(s))  Urine Culture     Status: Abnormal   Collection Time: 02/19/21 11:59 PM   Specimen: Urine, Clean Catch  Result Value Ref Range Status   Specimen Description   Final    URINE, CLEAN CATCH Performed at [facility], [place] [address], [place], [place] [address]    Special Requests   Final    NONE Performed at [facility], [place] [address] [place], [place] [address]    Culture MULTIPLE SPECIES PRESENT, SUGGEST RECOLLECTION (A)  Final   Report Status 02/21/2021 FINAL  Final  Blood Culture (routine x 2)     Status: Abnormal   Collection Time: 02/23/21  4:05 PM   Specimen: BLOOD RIGHT FOREARM  Result Value Ref Range Status   Specimen Description   Final    BLOOD RIGHT FOREARM Performed at [place] [address] [place], [place] [address]    Special Requests   Final    BOTTLES DRAWN AEROBIC AND ANAEROBIC Blood Culture adequate  volume Performed at [place] [address], [place], [place] [address]    Culture  Setup Time   Final    GRAM NEGATIVE RODS IN BOTH AEROBIC AND ANAEROBIC BOTTLES CRITICAL RESULT CALLED TO, READ BACK BY AND VERIFIED WITH: PHARMD M BELL [id number] AT 907 AM BY CM Performed at [facility], [place] [address], [place], [place] [address]    Culture (A)  Final    KLEBSIELLA PNEUMONIAE Confirmed Extended Spectrum Beta-Lactamase Producer (ESBL).  In bloodstream infections from ESBL organisms, carbapenems are preferred over piperacillin/tazobactam. They are shown to have a lower risk of mortality.    Report Status 02/26/2021 FINAL  Final   Organism ID, Bacteria KLEBSIELLA PNEUMONIAE  Final      Susceptibility   Klebsiella pneumoniae - MIC*    AMPICILLIN >=32 RESISTANT Resistant     CEFAZOLIN >=64 RESISTANT Resistant     CEFEPIME 2 SENSITIVE Sensitive     CEFTAZIDIME RESISTANT Resistant     CEFTRIAXONE >=64 RESISTANT Resistant     CIPROFLOXACIN 2 INTERMEDIATE Intermediate     GENTAMICIN >=16 RESISTANT Resistant     IMIPENEM <=0.25 SENSITIVE Sensitive     TRIMETH/SULFA >=320 RESISTANT Resistant  AMPICILLIN/SULBACTAM >=32 RESISTANT Resistant     PIP/TAZO 16 SENSITIVE Sensitive     * KLEBSIELLA PNEUMONIAE  Blood Culture ID Panel (Reflexed)     Status: Abnormal (Preliminary result)   Collection Time: 02/23/21  4:05 PM  Result Value Ref Range Status   Enterococcus faecalis NOT DETECTED NOT DETECTED Final   Enterococcus Faecium NOT DETECTED NOT DETECTED Final   Listeria monocytogenes NOT DETECTED NOT DETECTED Final   Staphylococcus species NOT DETECTED NOT DETECTED Final   Staphylococcus aureus (BCID) NOT DETECTED NOT DETECTED Final   Staphylococcus epidermidis NOT DETECTED NOT DETECTED Final   Staphylococcus lugdunensis NOT DETECTED NOT DETECTED Final   Streptococcus species NOT DETECTED NOT DETECTED Final   Streptococcus agalactiae NOT DETECTED NOT DETECTED Final    Streptococcus pneumoniae NOT DETECTED NOT DETECTED Final   Streptococcus pyogenes NOT DETECTED NOT DETECTED Final   A.calcoaceticus-baumannii NOT DETECTED NOT DETECTED Final   Bacteroides fragilis NOT DETECTED NOT DETECTED Final   Enterobacterales PENDING NOT DETECTED Incomplete   Enterobacter cloacae complex NOT DETECTED NOT DETECTED Final   Escherichia coli NOT DETECTED NOT DETECTED Final   Klebsiella aerogenes NOT DETECTED NOT DETECTED Final   Klebsiella oxytoca NOT DETECTED NOT DETECTED Final   Klebsiella pneumoniae DETECTED (A) NOT DETECTED Final    Comment: CRITICAL RESULT CALLED TO, READ BACK BY AND VERIFIED WITH: PHARMD M BELL [id number] AT 907 AM BY CM    Proteus species NOT DETECTED NOT DETECTED Final   Salmonella species NOT DETECTED NOT DETECTED Final   Serratia marcescens NOT DETECTED NOT DETECTED Final   Haemophilus influenzae NOT DETECTED NOT DETECTED Final   Neisseria meningitidis NOT DETECTED NOT DETECTED Final   Pseudomonas aeruginosa NOT DETECTED NOT DETECTED Final   Stenotrophomonas maltophilia NOT DETECTED NOT DETECTED Final   Candida albicans NOT DETECTED NOT DETECTED Final   Candida auris NOT DETECTED NOT DETECTED Final   Candida glabrata NOT DETECTED NOT DETECTED Final   Candida krusei NOT DETECTED NOT DETECTED Final   Candida parapsilosis NOT DETECTED NOT DETECTED Final   Candida tropicalis NOT DETECTED NOT DETECTED Final   Cryptococcus neoformans/gattii NOT DETECTED NOT DETECTED Final   CTX-M ESBL DETECTED (A) NOT DETECTED Final    Comment: CRITICAL RESULT CALLED TO, READ BACK BY AND VERIFIED WITH: PHARMD M BELL [id number] AT 907 AM BY CM (NOTE) Extended spectrum beta-lactamase detected. Recommend a carbapenem as initial therapy.      Carbapenem resistance IMP NOT DETECTED NOT DETECTED Final   Carbapenem resistance KPC NOT DETECTED NOT DETECTED Final   Carbapenem resistance NDM NOT DETECTED NOT DETECTED Final   Carbapenem resist OXA 48 LIKE NOT DETECTED NOT  DETECTED Final   Carbapenem resistance VIM NOT DETECTED NOT DETECTED Final    Comment: Performed at [facility], [place] [address], [place], [place] [address]  Blood Culture (routine x 2)     Status: Abnormal   Collection Time: 02/23/21  4:24 PM   Specimen: BLOOD  Result Value Ref Range Status   Specimen Description   Final    BLOOD LEFT ANTECUBITAL Performed at [place] [address] [place] [address] [place], [place] [address]    Special Requests   Final    BOTTLES DRAWN AEROBIC AND ANAEROBIC Blood Culture adequate volume Performed at [place] [address] [place], [place] [address]    Culture  Setup Time   Final    GRAM NEGATIVE RODS AEROBIC BOTTLE ONLY CRITICAL VALUE NOTED.  VALUE IS CONSISTENT WITH  PREVIOUSLY REPORTED AND CALLED VALUE.    Culture (A)  Final    KLEBSIELLA PNEUMONIAE SUSCEPTIBILITIES PERFORMED ON PREVIOUS CULTURE WITHIN THE LAST 5 DAYS. Performed at [facility], [place] [address], [place], [place] [address]    Report Status 02/26/2021 FINAL  Final  Resp Panel by RT-PCR (Flu A&B, Covid) Nasopharyngeal Swab     Status: None   Collection Time: 02/23/21  4:44 PM   Specimen: Nasopharyngeal Swab; Nasopharyngeal(NP) swabs in vial transport medium  Result Value Ref Range Status   SARS Coronavirus 2 by RT PCR NEGATIVE NEGATIVE Final    Comment: (NOTE) SARS-CoV-2 target nucleic acids are NOT DETECTED.  The SARS-CoV-2 RNA is generally detectable in upper respiratory specimens during the acute phase of infection. The lowest concentration of SARS-CoV-2 viral copies this assay can detect is 138 copies/mL. A negative result does not preclude SARS-Cov-2 infection and should not be used as the sole basis for treatment or other patient management decisions. A negative result may occur with  improper specimen collection/handling, submission of specimen other than nasopharyngeal swab, presence of viral mutation(s) within the areas  targeted by this assay, and inadequate number of viral copies(<138 copies/mL). A negative result must be combined with clinical observations, patient history, and epidemiological information. The expected result is Negative.  Fact Sheet for Patients:  EntrepreneurPulse.com.au  Fact Sheet for Healthcare Providers:  IncredibleEmployment.be  This test is no t yet approved or cleared by the [place] FDA and  has been authorized for detection and/or diagnosis of SARS-CoV-2 by FDA under an Emergency Use Authorization (EUA). This EUA will remain  in effect (meaning this test can be used) for the duration of the COVID-19 declaration under Section 564(b)(1) of the Act, 21 U.S.C.section 360bbb-3(b)(1), unless the authorization is terminated  or revoked sooner.       Influenza A by PCR NEGATIVE NEGATIVE Final   Influenza B by PCR NEGATIVE NEGATIVE Final    Comment: (NOTE) The Xpert Xpress SARS-CoV-2/FLU/RSV plus assay is intended as an aid in the diagnosis of influenza from Nasopharyngeal swab specimens and should not be used as a sole basis for treatment. Nasal washings and aspirates are unacceptable for Xpert Xpress SARS-CoV-2/FLU/RSV testing.  Fact Sheet for Patients: EntrepreneurPulse.com.au  Fact Sheet for Healthcare Providers: IncredibleEmployment.be  This test is not yet approved or cleared by the [place] FDA and has been authorized for detection and/or diagnosis of SARS-CoV-2 by FDA under an Emergency Use Authorization (EUA). This EUA will remain in effect (meaning this test can be used) for the duration of the COVID-19 declaration under Section 564(b)(1) of the Act, 21 U.S.C. section 360bbb-3(b)(1), unless the authorization is terminated or revoked.  Performed at [facility], [place] [address] [place], [place] [address]   Urine Culture     Status: Abnormal   Collection  Time: 02/23/21  4:54 PM   Specimen: In/Out Cath Urine  Result Value Ref Range Status   Specimen Description   Final    IN/OUT CATH URINE Performed at [place] [address] [place], [place] [address]    Special Requests   Final    NONE Performed at [facility], [place] [address] [place], [place] [address]    Culture (A)  Final    20,000 COLONIES/mL KLEBSIELLA PNEUMONIAE Confirmed Extended Spectrum Beta-Lactamase Producer (ESBL).  In bloodstream infections from ESBL organisms, carbapenems are preferred over piperacillin/tazobactam. They are shown to have a lower risk of mortality. 20,000 COLONIES/mL  ENTEROCOCCUS FAECALIS    Report Status 02/26/2021 FINAL  Final   Organism ID, Bacteria KLEBSIELLA PNEUMONIAE (A)  Final   Organism ID, Bacteria ENTEROCOCCUS FAECALIS (A)  Final      Susceptibility   Enterococcus faecalis - MIC*    AMPICILLIN <=2 SENSITIVE Sensitive     NITROFURANTOIN <=16 SENSITIVE Sensitive     VANCOMYCIN 2 SENSITIVE Sensitive     * 20,000 COLONIES/mL ENTEROCOCCUS FAECALIS   Klebsiella pneumoniae - MIC*    AMPICILLIN >=32 RESISTANT Resistant     CEFAZOLIN >=64 RESISTANT Resistant     CEFEPIME >=32 RESISTANT Resistant     CEFTRIAXONE >=64 RESISTANT Resistant     CIPROFLOXACIN 2 INTERMEDIATE Intermediate     GENTAMICIN >=16 RESISTANT Resistant     IMIPENEM <=0.25 SENSITIVE Sensitive     NITROFURANTOIN 128 RESISTANT Resistant     TRIMETH/SULFA >=320 RESISTANT Resistant     AMPICILLIN/SULBACTAM >=32 RESISTANT Resistant     PIP/TAZO 16 SENSITIVE Sensitive     * 20,000 COLONIES/mL KLEBSIELLA PNEUMONIAE  MRSA Next Gen by PCR, Nasal     Status: None   Collection Time: 02/26/21 10:00 PM   Specimen: Nasal Mucosa; Nasal Swab  Result Value Ref Range Status   MRSA by PCR Next Gen NOT DETECTED NOT DETECTED Final    Comment: (NOTE) The GeneXpert MRSA Assay (FDA approved for NASAL specimens only), is one component of a comprehensive  MRSA colonization surveillance program. It is not intended to diagnose MRSA infection nor to guide or monitor treatment for MRSA infections. Test performance is not FDA approved in patients less than 37 years old. Performed at [facility], [place] [address], [place], [place] [address]     Alcide Evener, [place] [facility] [phone] pager  02/28/2021, 11:47 AM

## 2021-02-28 NOTE — TOC Initial Note (Signed)
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  Since procedure has been passing blood/blood clots with urinary retention.  Present to the ER on 02/19/2021 with complaints of Foley dislocation and large urinary blood clot and inability to urinate.  Discharge after placing a new Foley.  Hemoglobin was noticed to be between 9-12 g%.  He represented to the emergency department on 02/23/2021 for this current admission with weakness, decreased mobility, hematuria, with clot retention and related hypotension requiring fluids, fever 103 Fahrenheit and sepsis syndrome and acute kidney injury of 2.1 creatinine   Course in the hospital Since admission he has become afebrile since 02/24/2021.  His admission blood cultures are growing Klebsiella pneumonia ESBL from urinary source of the same result along with Enterococcus faecalis in the urine.  He developed atrial fibrillation with rapid ventricular rate on 02/25/2021.  On 02/25/2021 or 24 French three-way Foley catheter was placed for irrigation and unable to irrigate.  He is also receiving blood transfusion.  He is being treated with meropenem.  Then on 02/26/2021 he developed hypoxemia  89% with clinical features of volume overload and started on Lasix.  Continuous bladder irrigation has been continued.  Later on 02/26/2021 developed confusion/encephalopathy and transferred to stepdown.  And worsening oxygen needs on 02/26/2021 to 10 L nasal cannula ongoing septic encephalopathy.  On 9/11 atrial fibrillation improving on Cardizem infusion but hypoxemia worse to 12 L nasal cannula and renal injury worsening again after initial improvement.    TOC PLAN OF CARE: from home with wife, on HFNC at 10l/min,Iv amiodaroneand iv cardizem converted to nsr on [id number], iv merrem, bun 40 creat 2.03 bld glucose is 269. Plan: to return to home may need home o2  Expected Discharge Plan: Home/Self Care Barriers to Discharge: Continued Medical Work up   Patient Goals and CMS Choice        Expected Discharge Plan and Services Expected Discharge Plan: Home/Self Care   Discharge Planning Services: CM Consult   Living arrangements for the past 2 months: Single Family Home                                      Prior Living Arrangements/Services Living arrangements for the past 2 months: Single Family Home Lives with:: Spouse Patient language and need for interpreter reviewed:: Yes Do you feel safe going back to the place where you live?: Yes            Criminal Activity/Legal Involvement Pertinent to Current Situation/Hospitalization: No - Comment as needed  Activities of Daily Living Home Assistive Devices/Equipment: CBG Meter, Dentures (specify type) (upper partial plate) ADL  Screening (condition at time of admission) Patient's cognitive ability adequate to safely complete daily activities?: Yes Is the patient deaf or have difficulty hearing?: Yes Does the patient have difficulty seeing, even when wearing glasses/contacts?: No Does the patient have difficulty concentrating, remembering, or making decisions?: No Patient able to express need for assistance with ADLs?: Yes Does  the patient have difficulty dressing or bathing?: No Independently performs ADLs?: No Communication: Independent Dressing (OT): Independent Grooming: Needs assistance Is this a change from baseline?: Change from baseline, expected to last >3 days Feeding: Independent Bathing: Independent Toileting: Needs assistance Is this a change from baseline?: Change from baseline, expected to last <3 days In/Out Bed: Needs assistance Is this a change from baseline?: Change from baseline, expected to last <3 days Walks in Home: Needs assistance Is this a change from baseline?: Change from baseline, expected to last <3 days Does the patient have difficulty walking or climbing stairs?: Yes Weakness of Legs: Both Weakness of Arms/Hands: Both  Permission Sought/Granted                  Emotional Assessment Appearance:: Appears stated age     Orientation: : Oriented to Self, Oriented to Place, Oriented to  Time, Oriented to Situation Alcohol / Substance Use: Not Applicable Psych Involvement: No (comment)  Admission diagnosis:  AKI (acute kidney injury) ([place]) [N17.9] Sepsis ([place]) [A41.9] Anemia, unspecified type [D64.9] Sepsis with acute renal failure without septic shock, due to unspecified organism, unspecified acute renal failure type ([place]) [A41.9, R65.20, N17.9] Patient Active Problem List   Diagnosis Date Noted   Sepsis with acute renal failure without septic shock ([place])    Severe Sepsis secondary to UTI ([place]) 02/23/2021   AKI (acute kidney injury) ([place]) 02/23/2021   Anemia 02/23/2021   Thrombocytopenia ([place]) 02/23/2021   Nephrolithiasis 02/11/2021   Syncope 09/18/2019   Encounter for general adult medical examination with abnormal findings 04/30/2018   Muscle spasm 04/30/2018   Dysuria 04/30/2018   Cirrhosis of liver without ascites ([place]) 12/17/2015   Pericardial effusion 07/22/2014   Restless legs syndrome 06/26/2014   Other amnesia 06/04/2014   Coronary artery disease  involving native coronary artery of native heart without angina pectoris 04/13/2014   Cardiomyopathy, ischemic 04/13/2014   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   Obstructive sleep apnea syndrome 03/12/2014   Acute pericarditis, unspecified - possible 09/04/2013   Exertional chest pain 09/04/2013   Chest pain with moderate risk for cardiac etiology - etiology likely pericarditis 09/01/2013   Abnormal exercise tolerance test 08/26/2013   Depression 08/07/2013   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   Diabetes mellitus, type 2 07/17/2013   Type 2 diabetes mellitus without complications ([place]) 82/64/1583   Unstable angina ([place]) 07/09/2013   Atrial fibrillation s/p PVI ablation at [facility] 2001    Non-ST elevation myocardial infarction (NSTEMI), subendocardial infarction, subsequent episode of care ([place]) 06/27/2013    Class: History of   PCP:  Townsend Roger, MD Pharmacy:   [id number] [place], [place] - [email or web address] [id number]: [phone]: [phone]    Social Determinants of Health (SDOH) Interventions    Readmission Risk Interventions No flowsheet data found.

## 2021-02-28 NOTE — Progress Notes (Signed)
Called by RN for difficulty with CBI draining. Evaluated by 4E RN prior to call.  Attempted irrigation without success.  I came and assessed patient.  24Fr 3-way foley in place.  Bladder distended.  CBI turned off as appropriate.    Lidocaine jelly placed around foley.  Foley gently pushed further into bladder.  Toomey syringe used to then irrigate foley with return of moderate sized clot.  Immediate return of urine following clot removal.  Subsequent irrigation without further clot.  CBI resumed.  Patient bladder decompressed and patient fell asleep.

## 2021-02-28 NOTE — Progress Notes (Signed)
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  Since procedure has been passing blood/blood clots with urinary retention.  Present to the ER on 02/19/2021 with complaints of Foley dislocation and large urinary blood clot and inability to urinate.  Discharge after placing a new Foley.  Hemoglobin was noticed to be between 9-12 g%.  He represented to the emergency department on 02/23/2021 for this current admission with weakness, decreased mobility, hematuria, with clot retention and related hypotension requiring fluids, fever 103 Fahrenheit and sepsis syndrome and acute kidney injury of 2.1 creatinine  Course in the hospital Since admission he has become afebrile since 02/24/2021.  His admission blood cultures are growing Klebsiella pneumonia ESBL from urinary source of the same result along with Enterococcus faecalis in the urine.  He developed atrial fibrillation with rapid ventricular rate on 02/25/2021.  On 02/25/2021 or 24 French three-way Foley catheter was placed for irrigation and unable to irrigate.  He is also receiving blood transfusion.  He is being treated  with meropenem.  Then on 02/26/2021 he developed hypoxemia 89% with clinical features of volume overload and started on Lasix.  Continuous bladder irrigation has been continued.  Later on 02/26/2021 developed confusion/encephalopathy and transferred to stepdown.  And worsening oxygen needs on 02/26/2021 to 10 L nasal cannula ongoing septic encephalopathy.  On 9/11 atrial fibrillation improving on Cardizem infusion but hypoxemia worse to 12 L nasal cannula and renal injury worsening again after initial improvement.     Critical care medicine consulted 02/23/2021.   Pertinent  Medical History    has a past medical history of Anemia (12/2020), Anginal pain ([place]), Atrial fibrillation ([place]), CAD (coronary artery disease), Chronic back pain greater than 3 months duration, Cirrhosis, non-alcoholic ([place]), Depression, DJD (degenerative joint disease) of cervical spine, DJD (degenerative joint disease), lumbosacral, Dysrhythmia, Herniated cervical disc, HLD (hyperlipidemia), Ischemic cardiomyopathy, Liver cirrhosis secondary to NASH [facility]), NSTEMI (non-ST elevated myocardial infarction) ([place]) (06/27/2013), Osteoarthritis, Pericardial effusion, Pneumonia (~ 2005), and Type II diabetes mellitus ([place]).   reports that he quit smoking about 13 years ago. His smoking use included cigarettes. He has a 64.50 pack-year smoking history. He has never used smokeless tobacco.  Past Surgical History:  Procedure Laterality Date   APPENDECTOMY  ~ 1965   ATRIAL FIBRILLATION ABLATION  2000   Dr Omelia Blackwater at [place] N/A 10/07/2013   Procedure: LEFT AND RIGHT HEART CATHETERIZATION WITH CORONARY ANGIOGRAM;  Surgeon: Troy Sine, MD;  Location: [facility] CATH LAB;  Service: Cardiovascular;  Laterality: N/A;   LEFT HEART CATHETERIZATION WITH CORONARY ANGIOGRAM N/A 06/27/2013   Procedure: LEFT HEART CATHETERIZATION WITH CORONARY ANGIOGRAM;  Surgeon: Troy Sine, MD;  Location: [place]  CATH LAB;  Service: Cardiovascular;  Laterality: N/A;   LEFT HEART CATHETERIZATION WITH CORONARY ANGIOGRAM N/A 07/10/2013   Procedure: LEFT HEART CATHETERIZATION WITH CORONARY ANGIOGRAM;  Surgeon: Burnell Blanks, MD;  Location: [facility] CATH LAB;  Service: Cardiovascular;  Laterality: N/A;   LEFT HEART CATHETERIZATION WITH CORONARY ANGIOGRAM N/A 08/28/2013   Procedure: LEFT HEART CATHETERIZATION WITH CORONARY ANGIOGRAM;  Surgeon: Troy Sine, MD;  Location: [facility] CATH LAB;  Service: Cardiovascular;  Laterality: N/A;   NEPHROLITHOTOMY Left 02/11/2021   Procedure: LEFT NEPHROLITHOTOMY PERCUTANEOUS WITH SURGEON ACCESS;  Surgeon: [name], MD;  Location: [facility] ORS;  Service: Urology;  Laterality: Left;   PERCUTANEOUS CORONARY STENT INTERVENTION (PCI-S)  06/27/2013   Procedure: PERCUTANEOUS CORONARY STENT INTERVENTION (PCI-S);  Surgeon: Troy Sine, MD;  Location: [facility] CATH LAB;  Service: Cardiovascular;;   POSTERIOR CERVICAL FUSION/FORAMINOTOMY  2009   fusion c3-t1   PULMONARY VEIN ABLATION  2001   Dr Omelia Blackwater at [facility]    Allergies  Allergen Reactions   Codeine Nausea And Vomiting   Fentanyl Nausea And Vomiting   Naproxen Nausea Only   Other Nausea And Vomiting    1. N/V to all opiates. He is able to take versed. (noted 09/29/13) 2.The patient states that he has had  a reaction to all narcotics. He can take them though. (noted 06/26/13)    Immunization History  Administered Date(s) Administered   Influenza-Unspecified 04/19/2013   Pneumococcal Polysaccharide-23 09/02/2013    Family History  Problem Relation Age of Onset   COPD Other    Cancer Other    Diabetes Other    Heart disease Other    CAD Neg Hx        No hx early CAD     Current Facility-Administered Medications:    0.9 %  sodium chloride infusion (Manually program via Guardrails IV Fluids), , Intravenous, Once, Garba, Mohammad L, MD   0.9 %  sodium chloride infusion (Manually program via  Guardrails IV Fluids), , Intravenous, Once, Arrien, Jimmy Picket, MD   0.9 %  sodium chloride infusion, , Intravenous, PRN, Gala Romney L, MD, Last Rate: 10 mL/hr at 02/28/21 0412, 1,000 mL at 02/28/21 0412   [EXPIRED] amiodarone (NEXTERONE PREMIX) 360-4.14 MG/200ML-% (1.8 mg/mL) IV infusion, 60 mg/hr, Intravenous, Continuous, Stopped at 02/28/21 0005 **FOLLOWED BY** amiodarone (NEXTERONE PREMIX) 360-4.14 MG/200ML-% (1.8 mg/mL) IV infusion, 30 mg/hr, Intravenous, Continuous, Barrett, Rhonda G, PA-C, Last Rate: 16.67 mL/hr at 02/28/21 0413, 30 mg/hr at 02/28/21 0413   baclofen (LIORESAL) tablet 5 mg, 5 mg, Oral, TID, Arrien, Jimmy Picket, MD, 5 mg at 02/27/21 2312   buPROPion (WELLBUTRIN XL) 24 hr tablet 150 mg, 150 mg, Oral, Daily, Brand Males, MD   Chlorhexidine Gluconate Cloth 2 % PADS 6 each, 6 each, Topical, Daily, Gala Romney L, MD, 6 each at 02/27/21 1956   diltiazem (CARDIZEM) 125 mg in dextrose 5% 125 mL (1 mg/mL) infusion, 5-15 mg/hr, Intravenous, Titrated, Arrien, Jimmy Picket, MD, Last Rate: 12.5 mL/hr at 02/28/21 0300, 12.5 mg/hr at 02/28/21 0300   feeding supplement (ENSURE ENLIVE / ENSURE PLUS) liquid 237 mL, 237 mL, Oral, Q24H, Arrien, Jimmy Picket, MD   gabapentin (NEURONTIN) capsule 100 mg, 100 mg, Oral, TID, Chase Caller, Murali, MD, 100 mg at 02/27/21 2319   insulin aspart (novoLOG) injection 0-15 Units, 0-15 Units, Subcutaneous, TID WC, Gala Romney L, MD, 3 Units at 02/27/21 1725   insulin aspart (novoLOG) injection 0-5 Units, 0-5 Units, Subcutaneous,  Reuel Boom, MD, 2 Units at 02/26/21 2134   MEDLINE mouth rinse, 15 mL, Mouth Rinse, BID, Arrien, Jimmy Picket, MD, 15 mL at 02/27/21 2315   meropenem (MERREM) 1 g in sodium chloride 0.9 % 100 mL IVPB, 1 g, Intravenous, Q12H, Arrien, Jimmy Picket, MD, Stopped at 02/27/21 2352   metoprolol tartrate (LOPRESSOR) injection 5 mg, 5 mg, Intravenous, Q4H PRN, Arrien, Jimmy Picket, MD, 5 mg at  02/27/21 0548   metoprolol tartrate (LOPRESSOR) tablet 50 mg, 50 mg, Oral, BID, Arrien, Jimmy Picket, MD, 50 mg at 02/27/21 1028   multivitamin with minerals tablet 1 tablet, 1 tablet, Oral, Daily, Arrien, Jimmy Picket, MD, 1 tablet at 02/26/21 4098   niacin tablet 500 mg, 500 mg, Oral, Daily, Jonelle Sidle, Mohammad L, MD, 500 mg at 02/27/21 1217   nitroGLYCERIN (NITROSTAT) SL tablet 0.4 mg, 0.4 mg, Sublingual, Q5 min PRN, Gala Romney L, MD   ondansetron (ZOFRAN) injection 4 mg, 4 mg, Intravenous, Q6H PRN, Arrien, Jimmy Picket, MD, 4 mg at 02/27/21 0605   pantoprazole (PROTONIX) EC tablet 40 mg, 40 mg, Oral, BID, Garba, Mohammad L, MD, 40 mg at 02/27/21 2312   promethazine (PHENERGAN) 12.5 mg in sodium chloride 0.9 % 50 mL IVPB, 12.5 mg, Intravenous, Q4H PRN, Arrien, Jimmy Picket, MD, Stopped at 02/27/21 1619   rOPINIRole (REQUIP) tablet 1 mg, 1 mg, Oral, QHS, Garba, Mohammad L, MD, 1 mg at 02/27/21 2314   rosuvastatin (CRESTOR) tablet 5 mg, 5 mg, Oral, QPM, Garba, Mohammad L, MD, 5 mg at 02/27/21 1836   Continuous Bladder Irrigation, , , Until Discontinued **AND** sodium chloride irrigation 0.9 % 3,000 mL, 3,000 mL, Irrigation, Continuous, [name], MD, 3,000 mL at 02/28/21 0414   sucralfate (CARAFATE) tablet 1 g, 1 g, Oral, TID WC & HS, Arrien, Jimmy Picket, MD, 1 g at 02/27/21 2312   tamsulosin (FLOMAX) capsule 0.4 mg, 0.4 mg, Oral, QPM, Garba, Mohammad L, MD, 0.4 mg at 02/27/21 1836   traMADol (ULTRAM) tablet 50 mg, 50 mg, Oral, Q6H PRN, Arrien, Jimmy Picket, MD   Significant Hospital Events: Including procedures, antibiotic start and stop dates in addition to other pertinent events   8/26 - uro procedure for Left UPJ 02/19/21 - eR visit for clot 02/23/2021 - admit 9/11 - ccm consult  Interim History / Subjective:  Urology to bedside overnight given some difficulty with foley drainage, irrigated with return of clot. Started on amiodarone gtt per cardiology for  AF/RVR.  Objective   Blood pressure (!) 115/53, pulse 82, temperature 97.6 F (36.4 C), temperature source Oral, resp. rate (!) 21, height 5' 7"  (1.702 m), weight 72.5 kg, SpO2 93 %.        Intake/Output Summary (Last 24 hours) at 02/28/2021 0720 Last data filed at 02/28/2021 0500 Gross per 24 hour  Intake 13034.01 ml  Output 17475 ml  Net -4440.99 ml   Filed Weights   02/23/21 1541  Weight: 72.5 kg    Examination: General appearance: 70 y.o., male, alert NAD, conversant  Eyes: anicteric sclerae, moist conjunctivae; no lid-lag; PERRL, tracking appropriately HENT: NCAT; oropharynx, MMM, no mucosal ulcerations; normal hard and soft palate Neck: Trachea midline; no lymphadenopathy Lungs: CTAB, no crackles, no wheeze, with normal respiratory effort CV: RRR, no MRGs  Abdomen: Soft, non-tender; non-distended, BS quiet Extremities: trace peripheral edema, radial and DP pulses present bilaterally  Skin: Normal temperature, turgor and texture; no rash Psych: Appropriate affect Neuro: Alert and oriented to person and place, no focal  deficit   Pinkish urine in foley bag  [place] with no worsening hydro, stent not visualized Ammonia ok   S Cr stable  Hb stable  Resolved Hospital Problem list   x  Assessment & Plan:   Acute hypoxic respiratory failure: Volume overload vs aspiration pneumonitis/pneumonia (does R>L alveolar opacities), vs transfusion associated lung injury or transfused ARDS. Smoking history raises concern for underlying COPD. Also snores and wonder if there may be component of sleep-disordered breathing/nocturnal desaturation. P:   -appears volume overloaded still but await final echo read to ensure no tamponade physiology first and will follow his I/O closely after relief of obstruction again overnight -wean oxygen for pulse ox goal greater than 88%, weaned to 5L [place] saturating 98% in the room for me -net negative fluid balance -PT/OT mobilize  Acute toxic metabolic  encephalopathy Likely due to some combination of polypharmacy, AKI and sepsis.  P:   - gabapentin dose reduced - hold home tramadol - wellbutrin 150 once daily - delirium precautions    Coronary artery disease status post MI status post stent on Plavix and aspirin P: - hold home DAPT until bleeding improves  AF/RVR: Has chronic AF. Pharmacologically converted today on amio. P: -f/u tte -metoprolol 50 mg BID -consider wean cardizem gtt as tolerated, adding back po dilt only as needed since he didn't tolerate it at home well due to hypotension -amio per cardiology   Klebsiella ESBL UTI and BSI at admission with severe sepsis Has defervesced. P:   Meropenem per the hospitalist   Obstructive uropathy with acute kidney injury -present on admission Hemorrhagic anemia from hematuria Urology following.  Suspect blood clots.  On continuous bladder irrigation. Stent not located on recent US. P:  - discussed with urology [place] from 9/11 whether stent remains in optimal position - awaiting KUB - CBI per uro - holding DAPT, AC   History of [place] without ascites.   P:   Consider lactulose if encephalopathy persists   Significant diabetes P:   SSI    Best Practice (right click and "Reselect all SmartList Selections" daily)   Diet/type: clear liquids and NPO w/ oral meds DVT prophylaxis: not indicated due to hematurai GI prophylaxis: H2B Lines: N/A Foley:  Yes, and it is still needed Code Status:  full code Last date of multidisciplinary goals of care discussion [02/27/2021 with a wife, hospitalist at the bedside and bedside nurse-full code]     ATTESTATION & SIGNATURE   The patient Kristopher [name] is critically ill with multiple organ systems failure and requires high complexity decision making for assessment and support, frequent evaluation and titration of therapies, application of advanced monitoring technologies and extensive interpretation of multiple databases.    Critical Care Time devoted to patient care services described in this note is  45  Minutes. This time reflects time of care of this signee Dr Verlee Monte. This critical care time does not reflect procedure time, or teaching time or supervisory time of PA/NP/Med student/Med Resident etc but could involve care discussion time    Fredirick Maudlin Pulmonary/Critical Care   02/28/2021 7:20 AM

## 2021-02-28 NOTE — Progress Notes (Signed)
Nutrition Follow-up  DOCUMENTATION CODES:   Not applicable  INTERVENTION:  - continue Ensure Plus, each supplement provides 350 kcal and 13 grams of protein. - will order Magic Cup BID with meals, each supplement provides 290 kcal and 9 grams of protein. - will order 30 ml Prosource Plus BID, each supplement provides 100 kcal and 15 grams protein.  - weigh patient today.    NUTRITION DIAGNOSIS:   Increased nutrient needs related to acute illness (sepsis) as evidenced by estimated needs. -ongoing  GOAL:   Patient will meet greater than or equal to 90% of their needs -unmet  MONITOR:   PO intake, Supplement acceptance, Labs, I & O's, Weight trends  REASON FOR ASSESSMENT:   Consult Assessment of nutrition requirement/status  ASSESSMENT:   70 y.o. male with medical history significant of atrial fibrillation, coronary artery disease, nonalcoholic cirrhosis, depression, hyperlipidemia, urinary retention, diabetes, who had urology procedure on August 25 for a large kidney stone.  Patient has been passing blood since the procedure and was seen in the ER with urinary retention on September 3, now back in the ER with fever chills and Urologic symptoms.  Patient meets sepsis criteria most likely secondary to UTI  Patient discussed in rounds this AM. Patient laying in bed with 2 family members at bedside at the time of RD visit.   Patient assessed by another RD on 9/8 at which time he was on Heart Healthy/Carb Modified diet. Diet was downgraded to CLD yesterday at 1550 and re-advanced to Soft today at 0850. The only documented intakes were 0% of breakfast and 0% of lunch on 9/10.    Family member reports that patient had 1/2 grilled cheese and 3-4 french fries for lunch today. Patient denies any abdominal discomfort before or after lunch. He does report ongoing back pain.   Ensure Plus ordered once/day on 9/8 and it appears that patient accepted this supplement on 9/9 and 9/10. He does  not recall trying this supplement and denies receiving it yet today.  Confirmed with RN that patient will receive Ensure around 1400 today.   He has not been weighed since 9/7. No edema present at this time.    Labs reviewed; CBG: 259 and 218 mg/dl, Na: 132 mmol/l, K: 3.3 mmol/l, Cl: 96 mmol/l, BUN: 40 mg/dl, creatinine: 2.03 mg/dl, Ca: 8.1 mg/dl, GFR: 35 ml/min.   Medications reviewed; 60 mg IV lasix x1, sliding scale novolog, 8 units semglee/day, 1 table multivitamin with minerals and iron/day, 500 mg oral niacin/day, 40 mg oral protonix BID, 10 mEq IV KCl x4 runs 9/11 and x4 runs 9/12, 1 g carafate TID, 100 mg oral thiamine/day.    Diet Order:   Diet Order             DIET SOFT Room service appropriate? Yes; Fluid consistency: Thin  Diet effective now                   EDUCATION NEEDS:   No education needs have been identified at this time  Skin:  Skin Assessment: Reviewed RN Assessment  Last BM:  9/7  Height:   Ht Readings from Last 1 Encounters:  02/23/21 5' 7"  (1.702 m)    Weight:   Wt Readings from Last 1 Encounters:  02/23/21 72.5 kg     Estimated Nutritional Needs:  Kcal:  1800-2000 Protein:  75-85g Fluid:  2L/day     Jarome Matin, MS, RD, LDN, CNSC Inpatient Clinical Dietitian RD pager # available in [facility]  After hours/weekend pager # available in [facility]

## 2021-02-28 NOTE — Progress Notes (Signed)
Subjective: Overnight the patient's Foley catheter occluded.  There was trouble initially irrigating the catheter.  Ultimately, the patient had approximately 2 L of fluid infused into the bladder before the catheter was unoccluded.  He was having quite a bit of pain and distention prior to the catheter being irrigated.  Once it was irrigated he had instantaneous relief.  The patient has had no significant gross hematuria over the last 24 hours.  Objective: Vital signs in last 24 hours: Temp:  [97.5 F (36.4 C)-98 F (36.7 C)] 97.6 F (36.4 C) (09/12 0730) Pulse Rate:  [53-167] 73 (09/12 1200) Resp:  [14-26] 21 (09/12 1200) BP: (80-137)/(38-102) 103/52 (09/12 1200) SpO2:  [91 %-100 %] 96 % (09/12 1200)  Intake/Output from previous day: 09/11 0701 - 09/12 0700 In: 13034 [I.V.:584.3; IV Piggyback:449.7] Out: 55974 [Urine:17475] Intake/Output this shift: Total I/O In: 3802.2 [I.V.:318.3; Other:3000; IV Piggyback:483.8] Out: 4050 [Urine:4050]  Physical Exam:  General: Alert and oriented Breathing comfortably with 2 L nasal cannula Catheter is draining yellow urine on it very slow CBI drip  Lab Results: Recent Labs    02/26/21 0933 02/27/21 0242 02/28/21 0254  HGB 9.4* 9.0* 10.4*  HCT 27.6* 26.4* 30.4*    BMET Recent Labs    02/27/21 0242 02/28/21 0254  NA 130* 132*  K 3.2* 3.3*  CL 97* 96*  CO2 17* 17*  GLUCOSE 231* 269*  BUN 40* 40*  CREATININE 2.16* 2.03*  CALCIUM 7.4* 8.1*      Studies/Results: DG Abd 1 View  Result Date: 02/28/2021 CLINICAL DATA:  Fever and chills EXAM: ABDOMEN - 1 VIEW COMPARISON:  Ultrasound from the previous day FINDINGS: Scattered large and small bowel gas is noted. No abnormal mass or abnormal calcifications are seen. Left ureteral stent is noted in satisfactory position. No acute bony abnormality is noted. IMPRESSION: No acute abnormality noted. Electronically Signed   By: Inez Catalina M.D.   On: 02/28/2021 11:38   MR BRAIN WO  CONTRAST  Result Date: 02/26/2021 CLINICAL DATA:  Initial evaluation for neuro deficit, stroke suspected. EXAM: MRI HEAD WITHOUT CONTRAST TECHNIQUE: Multiplanar, multiecho pulse sequences of the brain and surrounding structures were obtained without intravenous contrast. COMPARISON:  Prior CT from earlier the same day. FINDINGS: Brain: A limited stroke protocol consisting of axial DWI and FLAIR sequences only was performed. Diffusion-weighted imaging demonstrates no convincing foci of restricted diffusion to suggest acute or subacute ischemia. Underlying age-related cerebral atrophy. Patchy confluent T2/FLAIR hyperintensity involving the periventricular deep white matter both cerebral hemispheres most consistent with chronic small vessel ischemic disease, moderate in nature. Chronic lacunar infarct present at the anterior left frontal centrum semi ovale. No visible mass lesion, mass effect or midline shift. No hydrocephalus or extra-axial fluid collection. No secondary signs for acute intracranial hemorrhage. Vascular: Major intracranial vascular flow voids are grossly maintained at the skull base. Skull and upper cervical spine: No visible focal marrow replacing lesion. Scalp soft tissues demonstrate no acute finding. Sinuses/Orbits: Globes and orbital soft tissues grossly within normal limits. Paranasal sinuses are largely clear on this limited exam. Trace left with small to moderate right mastoid effusion noted. Visualized nasopharynx grossly unremarkable. Other: None. IMPRESSION: 1. Limited stroke protocol MRI only was performed. 2. No acute intracranial infarct. No other definite acute intracranial abnormality on this limited exam. 3. Underlying age-related cerebral atrophy with moderate chronic small vessel ischemic disease. Remote lacunar infarct at the left frontal centrum semi ovale. Electronically Signed   By: Pincus Badder.D.  On: 02/26/2021 22:07   US Abdomen Complete  Result Date:  02/27/2021 CLINICAL DATA:  Cirrhosis EXAM: ABDOMEN ULTRASOUND COMPLETE COMPARISON:  02/23/2021 FINDINGS: Gallbladder: Echogenic sludge is identified within the gallbladder. No shadowing gallstones or evidence of cholecystitis. No gallbladder wall thickening. Common bile duct: Diameter: 2 mm Liver: No focal lesion identified. Within normal limits in parenchymal echogenicity. Portal vein is patent on color Doppler imaging with normal direction of blood flow towards the liver. IVC: No abnormality visualized. Pancreas: Visualized portion unremarkable. Spleen: Size and appearance within normal limits. Right Kidney: Length: 12.9 cm. Echogenicity within normal limits. No mass or hydronephrosis visualized. Left Kidney: Length: 13.5 cm. There is prominence of the left renal pelvis without frank hydronephrosis. The left ureteral stent seen previously is not identified. Cystic area within the lower pole left kidney measures 2.4 x 2.5 x 1.5 cm, and could reflect a focal dilated calyx or peripelvic cyst. No shadowing gallstones. Echogenicity within normal limits. Abdominal aorta: No aneurysm visualized. Other findings: Small right pleural effusion is incidentally noted. IMPRESSION: 1. Mild prominence of the left renal pelvis, without frank hydronephrosis. The left ureteral stent seen previously is not identified on this exam. 2. Focally dilated lower pole left renal calyx versus peripelvic cyst, measuring up to 2.5 cm. 3. Gallbladder sludge. No evidence of cholelithiasis or cholecystitis. Electronically Signed   By: Randa Ngo M.D.   On: 02/27/2021 20:23   DG CHEST PORT 1 VIEW  Result Date: 02/27/2021 CLINICAL DATA:  Shortness of breath EXAM: PORTABLE CHEST 1 VIEW COMPARISON:  02/26/2021, 02/23/2021 FINDINGS: Electronic device over left chest. Partially visualized cervical hardware. Slight decreased asymmetric right greater than left pulmonary airspace disease. Stable cardiomediastinal silhouette with aortic  atherosclerosis. No pneumothorax IMPRESSION: Slightly improved aeration since 02/26/2021 with decreased asymmetric right greater than left pulmonary airspace disease. Electronically Signed   By: Donavan Foil M.D.   On: 02/27/2021 15:41   CT HEAD CODE STROKE WO CONTRAST  Result Date: 02/26/2021 CLINICAL DATA:  Code stroke.  Neuro deficit, acute, stroke suspected EXAM: CT HEAD WITHOUT CONTRAST TECHNIQUE: Contiguous axial images were obtained from the base of the skull through the vertex without intravenous contrast. COMPARISON:  CT 02/23/2021. FINDINGS: Brain: No evidence of acute large vascular territory infarction, hemorrhage, hydrocephalus, extra-axial collection or mass lesion/mass effect. Similar patchy white matter hypodensities, nonspecific but compatible with chronic microvascular ischemic disease. Vascular: No hyperdense vessel identified. Skull: Normal. Negative for fracture or focal lesion. Sinuses/Orbits: Largely clear sinuses. Other: No mastoid effusions. ASPECTS [facility] Stroke Program Early CT Score) total score (0-10 with 10 being normal): 10. IMPRESSION: 1. No evidence of acute large vascular territory infarct or acute hemorrhage. ASPECTS is 10. 2. Chronic microvascular ischemic disease. Code stroke imaging results were communicated on 02/26/2021 at 6:22 pm to provider Dr. Cathlean Sauer via telephone, who verbally acknowledged these results. Electronically Signed   By: Margaretha Sheffield M.D.   On: 02/26/2021 18:24    Assessment/Plan: 1.  Patient has gross hematuria which initially was from kidney surgery.  That bleeding is stopped.  He does have an organized clot within his bladder which will not be able to irrigate through the Foley catheter.  To remove this he would not need to go to the operating room.  However, over time this will dissolve and come out on its own.  The ongoing bleeding is probably related to the Foley catheter and the stent.  I would like to remove the catheter, but at this point  I think the patient  has developed a stretch injury because of the inability to clear the clot overnight.  As such, I think we should leave the catheter in a few more days.  I did stop the continuous bladder irrigation today.  2.  The patient's ultrasound demonstrated some lower pole pelvic caliectasis, but otherwise no hydroureteronephrosis or stones.  He did have some clot within the bladder, this is likely lysing and improving.  The stent was not well visualized, but did appear to be unchanged in position compared paired to the CAT scan that he had upon admission on the [facility].  The patient's acute kidney injury does not appear to be obstructive in nature.  3.  The patient is recovering from urosepsis and is on meropenem.  We will continue this per ID.    LOS: 5 days   [name] 02/28/2021, 12:48 PM

## 2021-02-28 NOTE — Consult Note (Signed)
Cardiology Consultation:   Patient ID: Kristopher [name] MRN: [id number]; DOB: Oct 26, 1950  Admit date: 02/23/2021 Date of Consult: 02/28/2021  PCP:  Townsend Roger, MD   [facility] Providers Cardiologist: [facility]  Patient Profile:   Kristopher [name] is a 70 y.o. male with a history of CAD with NSTEMI in 06/2013 s/p DES to RCA, ischemic cardiomyopathy with EF as low as 40% in 2013 but improved to 55-60% in 09/2019, paroxysmal atrial fibrillation/flutter s/p PVI ablation x2 in 11/1998 at [facility], pericarditis in 2001 with chronic pericardial effusion, syncope presumed to be vasovagal s/p loop recorder, hyperlipidemia, type 2 diabetes mellitus, obstructive sleep apnea on CPAP, nonalcoholic cirrhosis of liver, who is being seen 02/28/2021 for the evaluation of atrial fibrillation at the request of Dr. Cathlean Sauer.  History of Present Illness:   [name] is a 70 year old male with the above history who is followed by [facility]. Patient has a history of CAD with recurrent chest pain. He was admitted in in 06/2013 with NSTEMI after presenting with chest pain with associated diaphoresis, shortness of breath, and bilateral arm pain. Cardiac catheterization showed 10-20% stenosis of proximal LAD and 90% stenosis of RCA. Patient underwent successful PCI with DES to RCA. Echo at that time showed LVEF of 40% with akinesis of the basal to mid inferior and inferoseptal walls. Patient had multiple episodes of recurrent chest pain following this and required repeat cardiac cath in 08/2013 which showed patent stent. Patient was treated with steroids and colchicine for concern for pericarditis and symptoms initially improved. However, he had recurrent pain in 09/2013 and again showed patent stent with otherwise non-obstructive CAD. Echo in 09/2019 showed LVEF of 55-60% with normal wall motion, moderate LVH, and moderate pericardial effusion.   Patient recently presented to [facility] on 02/11/2021 for planned left percutaneous  nephrolithotomy for nephrolithiasis and was then discharged the following day. He noted hematuria following procedure and was seen in the Urology office and folley catheter was placed. Patient presented back to ED on 02/19/2021 for further evaluation of being unable to urinate after catheter came out earlier in the day after passing a large clot. Foley catheter was replaced. Of note, hemoglobin had dropped into the 9 range down from the 12 range. This was felt to be from a continuous slow bleed since his procedure. He was asymptomatic so transfusion was not felt to be needed.   Patient presented back to the [facility] ED via EMS with concern for infection with fevers, chills, myalgias, nausea, vomiting, and abdominal pain. Patient also reported lightheadedness the night prior and fell and struck his head but denied any syncope. He was noted to be febrile, tachycardia, and borderline hypotensive. Patient was admitted with sepsis most likely due to ongoing UTI and patient was started on IV antibiotics and IV fluids. He also received 1 unit of PRBCs for symptomatic anemia due to hemoglobin of 6.8. Patient was noted to have more confusion and possible aphasia on 02/26/2021 and there was concern for stroke. Head CT and brain MRI showed no acute findings but did note a remote lacunar infarct at the left frontal centrum semi ovale. Patient has had increasing O2 requiring. Chest x-ray on 02/26/2021 showed bilateral patchy airspace opacity in the lungs concerning for bilateral, multilobar pneumonia. There was also concern for possible ARDS. He was started on IV Lasix. He has also had recurrent paroxsymal atrial fibrillation this admission for which Cardiology was consulted. He was started on IV Amiodarone yesterday.  At the time of this evaluation, patient resting comfortably in no acute distress.  He is back in normal sinus rhythm (converted shortly before midnight last night).  Sister-in-law is at bedside and she called  wife and had her on the phone during visit.Marland Kitchen  He still has some altered mental status states he is so little "squirrely."  However, he denies any cardiac complaints.  Wife states he has not had any problems with his atrial fibrillation at home since the time of his ablation.  No chest pain, shortness of breath, orthopnea, PND, edema.  He did have some lightheadedness prior to admission as described above but no syncope.  He does have a history of syncope which has been felt to be due to hypotension and vasovagal in nature but he has not had a syncopal episode in a while.  Patient and wife cannot remember his last syncopal episode.  He has been on Cardizem every other day at home because his BP would drop if he took it daily.  Of note, he has been on DAPT with Aspirin and Plavix since the time of his NSTEMI in 2015.  He continues to have some hematuria but states it is improved some.  Patient does have a remote smoking history but quit in 2008.  He does have a family history of cardiovascular disease with his father having CABG in his 71s.  He also describes sudden death in maternal grandfather and states one of his uncles died from atrial fibrillation.  Past Medical History:  Diagnosis Date   Anemia 12/2020   Anginal pain ([place])    Atrial fibrillation ([place])    a. s/p PVI X2 by Dr Omelia Blackwater at [facility] around 2000.   CAD (coronary artery disease)    a. NSTEMI (06/2013):  LHC (06/27/2013):  pLAD 10-20, RCA 90, EF 45-50%, inf HK.  PCI:  Xience Alpine (3 x 23 mm) DES to the RCA. b. Relook cath 07/10/13 - patent stent. Imdur added. c. Recurrent CP after that - abnormal nuc 07/2017 with chest pain, dyspnea, hypotensive response. Rest images with mid-basal inferior thinning. No stress images due to abrupt d/c of test. Relook cath 3/12 - no significant r   Chronic back pain greater than 3 months duration    Cirrhosis, non-alcoholic (HCC)    Depression    DJD (degenerative joint disease) of cervical spine    "3 herniated  discs" (09/01/2013)   DJD (degenerative joint disease), lumbosacral    "bulging L5-S1" (09/01/2013)   Dysrhythmia    PVCs   Herniated cervical disc    c2-T1 fusion   HLD (hyperlipidemia)    Ischemic cardiomyopathy    a. Echo (06/28/2011): Inferior and inferoseptal akinesis, EF 18%, grade 2 diastolic dysfunction, MAC, small effusion (no tamponade). B. EF 40% by echo 06/2013, cath 07/2013, 55% by cath 08/2013 with subtle mild mid inferior hypocontractility.   Liver cirrhosis secondary to NASH [facility])    NSTEMI (non-ST elevated myocardial infarction) ([place]) 06/27/2013   Osteoarthritis    "neck and lower back" (09/01/2013)   Pericardial effusion    a. Chronic since hx of pericarditis in 2004. b. Mod by echo 06/2013, small by echo 06/2013.   Pneumonia ~ 2005   Type II diabetes mellitus ([place])     Past Surgical History:  Procedure Laterality Date   APPENDECTOMY  ~ 1965   ATRIAL FIBRILLATION ABLATION  2000   Dr Omelia Blackwater at [place] N/A 10/07/2013  Procedure: LEFT AND RIGHT HEART CATHETERIZATION WITH CORONARY ANGIOGRAM;  Surgeon: Troy Sine, MD;  Location: [facility] CATH LAB;  Service: Cardiovascular;  Laterality: N/A;   LEFT HEART CATHETERIZATION WITH CORONARY ANGIOGRAM N/A 06/27/2013   Procedure: LEFT HEART CATHETERIZATION WITH CORONARY ANGIOGRAM;  Surgeon: Troy Sine, MD;  Location: [facility] CATH LAB;  Service: Cardiovascular;  Laterality: N/A;   LEFT HEART CATHETERIZATION WITH CORONARY ANGIOGRAM N/A 07/10/2013   Procedure: LEFT HEART CATHETERIZATION WITH CORONARY ANGIOGRAM;  Surgeon: Burnell Blanks, MD;  Location: [facility] CATH LAB;  Service: Cardiovascular;  Laterality: N/A;   LEFT HEART CATHETERIZATION WITH CORONARY ANGIOGRAM N/A 08/28/2013   Procedure: LEFT HEART CATHETERIZATION WITH CORONARY ANGIOGRAM;  Surgeon: Troy Sine, MD;  Location: [facility] CATH LAB;  Service: Cardiovascular;  Laterality: N/A;   NEPHROLITHOTOMY Left 02/11/2021   Procedure:  LEFT NEPHROLITHOTOMY PERCUTANEOUS WITH SURGEON ACCESS;  Surgeon: [name], MD;  Location: [facility] ORS;  Service: Urology;  Laterality: Left;   PERCUTANEOUS CORONARY STENT INTERVENTION (PCI-S)  06/27/2013   Procedure: PERCUTANEOUS CORONARY STENT INTERVENTION (PCI-S);  Surgeon: Troy Sine, MD;  Location: [facility] CATH LAB;  Service: Cardiovascular;;   POSTERIOR CERVICAL FUSION/FORAMINOTOMY  2009   fusion c3-t1   PULMONARY VEIN ABLATION  2001   Dr Omelia Blackwater at [place] Medications:  Prior to Admission medications   Medication Sig Start Date End Date Taking? Authorizing Provider  aspirin EC 81 MG tablet Take 81 mg by mouth every evening.    Yes [provider]  baclofen (LIORESAL) 10 MG tablet Take 1 tablet (10 mg total) by mouth 3 (three) times daily. 10/16/18  Yes Boscia, Greer Ee, NP  buPROPion (WELLBUTRIN XL) 300 MG 24 hr tablet Take 300 mg by mouth daily. 08/08/19  Yes [provider]  ciprofloxacin (CIPRO) 500 MG tablet Take 500 mg by mouth 2 (two) times daily. 02/22/21  Yes [provider]  clopidogrel (PLAVIX) 75 MG tablet Take 75 mg by mouth daily.   Yes [provider]  diltiazem (CARDIZEM SR) 120 MG 12 hr capsule Take 120 mg by mouth See admin instructions. Take 120 mg by mouth every 36 hours 01/21/21  Yes [provider]  gabapentin (NEURONTIN) 600 MG tablet Take 600 mg by mouth 3 (three) times daily. 02/17/21  Yes [provider]  ibuprofen (ADVIL) 200 MG tablet Take 800 mg by mouth every 6 (six) hours as needed for moderate pain.   Yes [provider]  metFORMIN (GLUCOPHAGE-XR) 500 MG 24 hr tablet Take 1,000 mg by mouth 2 (two) times daily. 07/25/19  Yes [provider]  niacin 500 MG tablet Take 500 mg by mouth daily.   Yes [provider]  nitroGLYCERIN (NITROSTAT) 0.4 MG SL tablet Place 0.4 mg under the tongue every 5 (five) minutes as needed for chest pain.   Yes [provider]  pantoprazole (PROTONIX) 40 MG tablet Take 40 mg by mouth 2 (two) times daily. 08/08/19  Yes [provider]  pioglitazone (ACTOS) 15 MG tablet Take 15 mg by mouth daily. 07/14/19  Yes [provider]  promethazine (PHENERGAN) 25 MG tablet Take 25 mg by mouth every 6 (six) hours as needed for nausea or vomiting. 07/14/19  Yes [provider]  rOPINIRole (REQUIP) 1 MG tablet Take 1 mg by mouth at bedtime. 02/07/21  Yes [provider]  rosuvastatin (CRESTOR) 5 MG tablet Take 5 mg by mouth every evening. 10/07/15  Yes  [provider]  tamsulosin (FLOMAX) 0.4 MG CAPS capsule Take 0.4 mg by mouth every evening. 02/17/21  Yes [provider]  traMADol (ULTRAM) 50 MG tablet Take 1-2 tablets (50-100 mg total) by mouth every 6 (six) hours as needed for moderate pain. Patient taking differently: Take 50 mg by mouth every 6 (six) hours as needed for moderate pain. 02/11/21  Yes [name], MD    Inpatient Medications: Scheduled Meds:  sodium chloride   Intravenous Once   sodium chloride   Intravenous Once   baclofen  5 mg Oral TID   buPROPion  150 mg Oral Daily   Chlorhexidine Gluconate Cloth  6 each Topical Daily   feeding supplement  237 mL Oral Q24H   gabapentin  100 mg Oral TID   insulin aspart  0-15 Units Subcutaneous TID WC   insulin aspart  0-5 Units Subcutaneous QHS   insulin glargine-yfgn  8 Units Subcutaneous Daily   mouth rinse  15 mL Mouth Rinse BID   metoprolol tartrate  50 mg Oral BID   multivitamins with iron  1 tablet Oral Daily   niacin  500 mg Oral Daily   pantoprazole  40 mg Oral BID   rOPINIRole  1 mg Oral QHS   rosuvastatin  5 mg Oral QPM   sucralfate  1 g Oral TID WC & HS   tamsulosin  0.4 mg Oral QPM   thiamine  100 mg Oral Daily   Continuous Infusions:  sodium chloride 1,000 mL (02/28/21 0412)   sodium chloride     amiodarone 30 mg/hr (02/28/21 0413)   diltiazem (CARDIZEM) infusion 12.5 mg/hr  (02/28/21 0300)   magnesium sulfate bolus IVPB 2 g (02/28/21 0848)   meropenem (MERREM) IV Stopped (02/27/21 2352)   potassium chloride 10 mEq (02/28/21 0853)   promethazine (PHENERGAN) injection (IM or IVPB) Stopped (02/27/21 1619)   sodium chloride irrigation     PRN Meds: sodium chloride, sodium chloride, metoprolol tartrate, nitroGLYCERIN, ondansetron (ZOFRAN) IV, promethazine (PHENERGAN) injection (IM or IVPB), traMADol  Allergies:    Allergies  Allergen Reactions   Codeine Nausea And Vomiting   Fentanyl Nausea And Vomiting   Naproxen Nausea Only   Other Nausea And Vomiting    1. N/V to all opiates. He is able to take versed. (noted 09/29/13) 2.The patient states that he has had  a reaction to all narcotics. He can take them though. (noted 06/26/13)    Social History:   Social History   Socioeconomic History   Marital status: Married    Spouse name: Not on file   Number of children: Not on file   Years of education: Not on file   Highest education level: Not on file  Occupational History   Occupation: RESP     Employer: [employer]    Comment: Scientist, clinical (histocompatibility and immunogenetics) at [employer]   Smoking status: Former    Packs/day: 1.50    Years: 43.00    Pack years: 64.50    Types: Cigarettes    Quit date: 05/06/2007    Years since quitting: 13.8   Smokeless tobacco: Never  Vaping Use   Vaping Use: Never used  Substance and Sexual Activity   Alcohol use: Yes    Comment: once monthly/rarely   Drug use: No   Sexual activity: Not Currently  Other Topics Concern   Not on file  Social History Narrative   Not on file   Social Determinants of Health  Financial Resource Strain: Not on file  Food Insecurity: Not on file  Transportation Needs: Not on file  Physical Activity: Not on file  Stress: Not on file  Social Connections: Not on file  Intimate Partner Violence: Not on file    Family History:   Family History  Problem Relation Age of  Onset   CAD Father    Sudden death Maternal Grandfather    COPD Other    Cancer Other    Diabetes Other    Heart disease Other    Arrhythmia Neg Hx      ROS:  Please see the history of present illness.  All other ROS reviewed and negative.     Physical Exam/Data:   Vitals:   02/28/21 0615 02/28/21 0630 02/28/21 0645 02/28/21 0730  BP: (!) 109/53 128/60 (!) 115/53   Pulse: 79 82 82   Resp:      Temp:    97.6 F (36.4 C)  TempSrc:    Oral  SpO2: 96% 96% 93%   Weight:      Height:        Intake/Output Summary (Last 24 hours) at 02/28/2021 0944 Last data filed at 02/28/2021 0800 Gross per 24 hour  Intake 13034.01 ml  Output 17825 ml  Net -4790.99 ml   Last 3 Weights 02/23/2021 02/19/2021 02/19/2021  Weight (lbs) 159 lb 13.3 oz 160 lb 160 lb  Weight (kg) 72.5 kg 72.576 kg 72.576 kg     Body mass index is 25.03 kg/m.  General: 70 y.o. male resting comfortably in no acute distress.  HEENT: Normocephalic and atraumatic. Sclera clear.  Neck: Supple. No JVD. Heart: RRR. Distinct S1 and S2. No murmurs, gallops, or rubs. Radial pedal pulses 2+ and equal bilaterally. Lungs: No increased work of breathing. Clear to ausculation bilaterally. No wheezes, rhonchi, or rales.  Abdomen: Soft, non-distended, and non-tender to palpation.  Extremities: No lower extremity edema.    Skin: Warm and dry. Neuro: Still encephalopathic. No focal deficits. Psych: Normal affect. Responds appropriately.  EKG:  The EKG was personally reviewed and demonstrates:  Sinus tachycardia, rate 107 bpm, with T wave inversions in V4-V6. Telemetry:  Telemetry was personally reviewed and demonstrates:  Normal sinus rhythm with rates in the 80s. Converted from atrial fibrillation shortly before midnight on 02/27/2021. When in atrial fibrillation, rates as high as the 160s.  Relevant CV Studies:  Echocardiogram 09/19/2019: Impressions: 1. Normal LV systolic function; moderate LVH; moderate pericardial  effusion; no  significant respiratory variation and IVC not dilated and  collapses.   2. Left ventricular ejection fraction, by estimation, is 55 to 60%. The  left ventricle has normal function. The left ventricle has no regional  wall motion abnormalities. There is moderate left ventricular hypertrophy.  Left ventricular diastolic function   could not be evaluated.   3. Right ventricular systolic function is normal. The right ventricular  size is normal.   4. Moderate pericardial effusion.   5. The mitral valve is normal in structure. Trivial mitral valve  regurgitation. No evidence of mitral stenosis.   6. The aortic valve is tricuspid. Aortic valve regurgitation is not  visualized. No aortic stenosis is present.   7. The inferior vena cava is normal in size with greater than 50%  respiratory variability, suggesting right atrial pressure of 3 mmHg.    Laboratory Data:  High Sensitivity Troponin:   Recent Labs  Lab 02/23/21 1605 02/23/21 1820 02/28/21 0537  TROPONINIHS 39* 38* 11  Chemistry Recent Labs  Lab 02/26/21 0933 02/27/21 0242 02/28/21 0254  NA 132* 130* 132*  K 3.4* 3.2* 3.3*  CL 102 97* 96*  CO2 16* 17* 17*  GLUCOSE 203* 231* 269*  BUN 40* 40* 40*  CREATININE 1.93* 2.16* 2.03*  CALCIUM 7.6* 7.4* 8.1*  GFRNONAA 37* 33* 35*  ANIONGAP 14 16* 19*    Recent Labs  Lab 02/23/21 1605 02/24/21 0501 02/27/21 1658  PROT 6.1* 5.9* 5.8*  ALBUMIN 3.3* 2.8* 2.3*  AST 113* 174* 38  ALT 27 47* 30  ALKPHOS 65 62 66  BILITOT 1.4* 1.4* 1.2   Hematology Recent Labs  Lab 02/26/21 0933 02/27/21 0242 02/28/21 0254  WBC 8.4 8.2 11.5*  RBC 3.09* 2.93* 3.39*  HGB 9.4* 9.0* 10.4*  HCT 27.6* 26.4* 30.4*  MCV 89.3 90.1 89.7  MCH 30.4 30.7 30.7  MCHC 34.1 34.1 34.2  RDW 17.2* 17.1* 17.0*  PLT 139* 121* 181   BNPNo results for input(s): BNP, PROBNP in the last 168 hours.  DDimer No results for input(s): DDIMER in the last 168 hours.   Radiology/Studies:  MR BRAIN WO  CONTRAST  Result Date: 02/26/2021 CLINICAL DATA:  Initial evaluation for neuro deficit, stroke suspected. EXAM: MRI HEAD WITHOUT CONTRAST TECHNIQUE: Multiplanar, multiecho pulse sequences of the brain and surrounding structures were obtained without intravenous contrast. COMPARISON:  Prior CT from earlier the same day. FINDINGS: Brain: A limited stroke protocol consisting of axial DWI and FLAIR sequences only was performed. Diffusion-weighted imaging demonstrates no convincing foci of restricted diffusion to suggest acute or subacute ischemia. Underlying age-related cerebral atrophy. Patchy confluent T2/FLAIR hyperintensity involving the periventricular deep white matter both cerebral hemispheres most consistent with chronic small vessel ischemic disease, moderate in nature. Chronic lacunar infarct present at the anterior left frontal centrum semi ovale. No visible mass lesion, mass effect or midline shift. No hydrocephalus or extra-axial fluid collection. No secondary signs for acute intracranial hemorrhage. Vascular: Major intracranial vascular flow voids are grossly maintained at the skull base. Skull and upper cervical spine: No visible focal marrow replacing lesion. Scalp soft tissues demonstrate no acute finding. Sinuses/Orbits: Globes and orbital soft tissues grossly within normal limits. Paranasal sinuses are largely clear on this limited exam. Trace left with small to moderate right mastoid effusion noted. Visualized nasopharynx grossly unremarkable. Other: None. IMPRESSION: 1. Limited stroke protocol MRI only was performed. 2. No acute intracranial infarct. No other definite acute intracranial abnormality on this limited exam. 3. Underlying age-related cerebral atrophy with moderate chronic small vessel ischemic disease. Remote lacunar infarct at the left frontal centrum semi ovale. Electronically Signed   By: Jeannine Boga M.D.   On: 02/26/2021 22:07   US Abdomen Complete  Result Date:  02/27/2021 CLINICAL DATA:  Cirrhosis EXAM: ABDOMEN ULTRASOUND COMPLETE COMPARISON:  02/23/2021 FINDINGS: Gallbladder: Echogenic sludge is identified within the gallbladder. No shadowing gallstones or evidence of cholecystitis. No gallbladder wall thickening. Common bile duct: Diameter: 2 mm Liver: No focal lesion identified. Within normal limits in parenchymal echogenicity. Portal vein is patent on color Doppler imaging with normal direction of blood flow towards the liver. IVC: No abnormality visualized. Pancreas: Visualized portion unremarkable. Spleen: Size and appearance within normal limits. Right Kidney: Length: 12.9 cm. Echogenicity within normal limits. No mass or hydronephrosis visualized. Left Kidney: Length: 13.5 cm. There is prominence of the left renal pelvis without frank hydronephrosis. The left ureteral stent seen previously is not identified. Cystic area within the lower pole left kidney measures 2.4 x 2.5  x 1.5 cm, and could reflect a focal dilated calyx or peripelvic cyst. No shadowing gallstones. Echogenicity within normal limits. Abdominal aorta: No aneurysm visualized. Other findings: Small right pleural effusion is incidentally noted. IMPRESSION: 1. Mild prominence of the left renal pelvis, without frank hydronephrosis. The left ureteral stent seen previously is not identified on this exam. 2. Focally dilated lower pole left renal calyx versus peripelvic cyst, measuring up to 2.5 cm. 3. Gallbladder sludge. No evidence of cholelithiasis or cholecystitis. Electronically Signed   By: Randa Ngo M.D.   On: 02/27/2021 20:23   DG CHEST PORT 1 VIEW  Result Date: 02/27/2021 CLINICAL DATA:  Shortness of breath EXAM: PORTABLE CHEST 1 VIEW COMPARISON:  02/26/2021, 02/23/2021 FINDINGS: Electronic device over left chest. Partially visualized cervical hardware. Slight decreased asymmetric right greater than left pulmonary airspace disease. Stable cardiomediastinal silhouette with aortic  atherosclerosis. No pneumothorax IMPRESSION: Slightly improved aeration since 02/26/2021 with decreased asymmetric right greater than left pulmonary airspace disease. Electronically Signed   By: Donavan Foil M.D.   On: 02/27/2021 15:41   DG CHEST PORT 1 VIEW  Result Date: 02/26/2021 CLINICAL DATA:  Dyspnea. EXAM: PORTABLE CHEST 1 VIEW COMPARISON:  02/23/2021 FINDINGS: Interval extensive patchy opacity in the right mid, upper and lower lung zones medially. Mild patchy opacity in the scratch the interval mild patchy opacity in the left upper lung zone. Normal sized heart. No pleural fluid. Thoracic spine degenerative changes. Lower cervical spine fixation hardware. Left chest loop recorder. IMPRESSION: Bilateral patchy airspace opacity in the lungs, significantly greater on the right compared to the left. This is concerning for bilateral, multilobar pneumonia. Alveolar edema is less likely in the absence of vascular congestion or cardiomegaly. Electronically Signed   By: Claudie Revering M.D.   On: 02/26/2021 14:55   CT HEAD CODE STROKE WO CONTRAST  Result Date: 02/26/2021 CLINICAL DATA:  Code stroke.  Neuro deficit, acute, stroke suspected EXAM: CT HEAD WITHOUT CONTRAST TECHNIQUE: Contiguous axial images were obtained from the base of the skull through the vertex without intravenous contrast. COMPARISON:  CT 02/23/2021. FINDINGS: Brain: No evidence of acute large vascular territory infarction, hemorrhage, hydrocephalus, extra-axial collection or mass lesion/mass effect. Similar patchy white matter hypodensities, nonspecific but compatible with chronic microvascular ischemic disease. Vascular: No hyperdense vessel identified. Skull: Normal. Negative for fracture or focal lesion. Sinuses/Orbits: Largely clear sinuses. Other: No mastoid effusions. ASPECTS [facility] Stroke Program Early CT Score) total score (0-10 with 10 being normal): 10. IMPRESSION: 1. No evidence of acute large vascular territory infarct or acute  hemorrhage. ASPECTS is 10. 2. Chronic microvascular ischemic disease. Code stroke imaging results were communicated on 02/26/2021 at 6:22 pm to provider Dr. Cathlean Sauer via telephone, who verbally acknowledged these results. Electronically Signed   By: Margaretha Sheffield M.D.   On: 02/26/2021 18:24     Assessment and Plan:   Paroxysmal Atrial Fibrillation/Flutter - Patient has a long history of atrial fibrillation/flutter. S/p ablation x2 in 2000/2001 at [facility]. No recurrence since then and not on anticoagulation at home. Patient was admitted in sepsis secondary to UTI and has had recurrent paroxysmal atrial fibrillation intermittent throughout admission. Started on IV Cardizem and then IV Amiodarone yesterday and converted back to sinus rhythm shortly before midnight. Rates currently in the 80s. - Potassium 3.3 and Magnesium 1.8.   - TSH normal. - Echo pending. - Continue IV Cardizem and IV Amiodarone for now. Also PO Lopressor 42m twice daily. - Suspect recurrent atrial fibrillation is due to underlying sepsis  secondary to UTI and possible pneumonia/ARDS and will improve with continued treatment of this. Can continue above treatment for now to help prevent recurrent atrial fibrillation. Patient does have a history of hypotension at home and has only been able to tolerate PO Cardizem every 36 hours at home due to drop in BP when taken daily. BP currently stable. Hopefully as infection is treated we can get him back to home Cardizem dosing. 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 LFTs normal. Suspect we will not discharge him on Amiodarone. - CHA2DS2-VASc = 5 (CAD, DM, age, prior infarct noted on MRI). Has not been started on any due to persistent hematuria and anemia. May need monitor at discharge to evaluate for recurrent atrial fibrillation and to help guide anticoagulation recommendations.   CAD - S/p DES to RCA in 2015.  - No chest pain. - Has been   maintained on DAPT with Aspirin and Plavix since 2015. Both on hold due to anemia. Plavix can likely be stopped at discharge. - Continue beta-blocker and statin.  Acute Hypoxic Respiratory Failure - Chest-ray on 02/26/2021 showed bilateral patchy airspace opacity in the lungs (right > left) concerning for bilateral multilobar pneumonia. There was also concern for possible ARDS or aspiration pneumonitis/pneumonia. Repeat chest x-ray on 02/27/2021 showed slightly improved aeration. - Received IV Lasix the last 2 days with significant urine output. Documented urine output of 17L yesterday. Net negative 8.25 L since admission. - Echo pending.  - Does not appear significantly follow-up overloaded on exam. Will check BNP. - Will defer additional Lasix to MD.  - Otherwise, management per primary team.  History of Pericarditis with Chronic Pericardial Effusion - Moderate effusion noted on last Echo in 09/2019. - Repeat Echo pending.   Acute Blood Loss Anemia Secondary to Hematuria - Hemoglobin 6.8 on admission. S/p 2 units of PRBC. Slowly improving and 10.4 today. - Being treated with CBI. - Management per Urology and primary team.  AKI - Creatinine 2.11 on admission and 2.03 today. Baseline 0.8 to 1.2. - Continue to avoid Nephrotoxic agents.  - Continue to follow closely.  Otherwise, per primary team: - Severe sepsis secondary to UTI - New metabolic encephalopathy - Acute gastritis - Dyslipidemia - Type 2 diabetes mellitus    Risk Assessment/Risk Scores:    CHA2DS2-VASc Score = 5  This indicates a 7.2% annual risk of stroke. 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

## 2021-03-01 ENCOUNTER — Inpatient Hospital Stay (HOSPITAL_COMMUNITY): Payer: Medicare HMO

## 2021-03-01 DIAGNOSIS — G4733 Obstructive sleep apnea (adult) (pediatric): Secondary | ICD-10-CM

## 2021-03-01 DIAGNOSIS — R319 Hematuria, unspecified: Secondary | ICD-10-CM

## 2021-03-01 DIAGNOSIS — I4891 Unspecified atrial fibrillation: Secondary | ICD-10-CM

## 2021-03-01 DIAGNOSIS — B952 Enterococcus as the cause of diseases classified elsewhere: Secondary | ICD-10-CM

## 2021-03-01 DIAGNOSIS — Z1612 Extended spectrum beta lactamase (ESBL) resistance: Secondary | ICD-10-CM

## 2021-03-01 DIAGNOSIS — I2510 Atherosclerotic heart disease of native coronary artery without angina pectoris: Secondary | ICD-10-CM | POA: Diagnosis not present

## 2021-03-01 DIAGNOSIS — R7881 Bacteremia: Secondary | ICD-10-CM

## 2021-03-01 DIAGNOSIS — T8579XD Infection and inflammatory reaction due to other internal prosthetic devices, implants and grafts, subsequent encounter: Secondary | ICD-10-CM

## 2021-03-01 DIAGNOSIS — A419 Sepsis, unspecified organism: Secondary | ICD-10-CM | POA: Diagnosis not present

## 2021-03-01 DIAGNOSIS — R0600 Dyspnea, unspecified: Secondary | ICD-10-CM | POA: Diagnosis not present

## 2021-03-01 DIAGNOSIS — T8579XA Infection and inflammatory reaction due to other internal prosthetic devices, implants and grafts, initial encounter: Secondary | ICD-10-CM

## 2021-03-01 DIAGNOSIS — A499 Bacterial infection, unspecified: Secondary | ICD-10-CM

## 2021-03-01 DIAGNOSIS — N179 Acute kidney failure, unspecified: Secondary | ICD-10-CM | POA: Diagnosis not present

## 2021-03-01 LAB — CBC WITH DIFFERENTIAL/PLATELET
Abs Immature Granulocytes: 0.13 10*3/uL — ABNORMAL HIGH (ref 0.00–0.07)
Basophils Absolute: 0 10*3/uL (ref 0.0–0.1)
Basophils Relative: 0 %
Eosinophils Absolute: 0 10*3/uL (ref 0.0–0.5)
Eosinophils Relative: 0 %
HCT: 27.2 % — ABNORMAL LOW (ref 39.0–52.0)
Hemoglobin: 9.6 g/dL — ABNORMAL LOW (ref 13.0–17.0)
Immature Granulocytes: 1 %
Lymphocytes Relative: 5 %
Lymphs Abs: 0.5 10*3/uL — ABNORMAL LOW (ref 0.7–4.0)
MCH: 31 pg (ref 26.0–34.0)
MCHC: 35.3 g/dL (ref 30.0–36.0)
MCV: 87.7 fL (ref 80.0–100.0)
Monocytes Absolute: 0.7 10*3/uL (ref 0.1–1.0)
Monocytes Relative: 8 %
Neutro Abs: 7.8 10*3/uL — ABNORMAL HIGH (ref 1.7–7.7)
Neutrophils Relative %: 86 %
Platelets: 189 10*3/uL (ref 150–400)
RBC: 3.1 MIL/uL — ABNORMAL LOW (ref 4.22–5.81)
RDW: 16.6 % — ABNORMAL HIGH (ref 11.5–15.5)
WBC: 9.2 10*3/uL (ref 4.0–10.5)
nRBC: 0 % (ref 0.0–0.2)

## 2021-03-01 LAB — GLUCOSE, CAPILLARY
Glucose-Capillary: 226 mg/dL — ABNORMAL HIGH (ref 70–99)
Glucose-Capillary: 179 mg/dL — ABNORMAL HIGH (ref 70–99)
Glucose-Capillary: 202 mg/dL — ABNORMAL HIGH (ref 70–99)
Glucose-Capillary: 138 mg/dL — ABNORMAL HIGH (ref 70–99)

## 2021-03-01 LAB — BASIC METABOLIC PANEL
Anion gap: 13 (ref 5–15)
BUN: 30 mg/dL — ABNORMAL HIGH (ref 8–23)
CO2: 24 mmol/L (ref 22–32)
Calcium: 7.8 mg/dL — ABNORMAL LOW (ref 8.9–10.3)
Chloride: 95 mmol/L — ABNORMAL LOW (ref 98–111)
Creatinine, Ser: 1.5 mg/dL — ABNORMAL HIGH (ref 0.61–1.24)
GFR, Estimated: 50 mL/min — ABNORMAL LOW (ref >60)
Glucose, Bld: 254 mg/dL — ABNORMAL HIGH (ref 70–99)
Potassium: 2.9 mmol/L — ABNORMAL LOW (ref 3.5–5.1)
Sodium: 132 mmol/L — ABNORMAL LOW (ref 135–145)

## 2021-03-01 LAB — MAGNESIUM: Magnesium: 1.6 mg/dL — ABNORMAL LOW (ref 1.7–2.4)

## 2021-03-01 MED ORDER — POTASSIUM CHLORIDE 10 MEQ/100ML IV SOLN
10.0000 meq | INTRAVENOUS | Status: AC
  Administered 2021-03-01 (×3): 10 meq via INTRAVENOUS
  Filled 2021-03-01 (×4): qty 100

## 2021-03-01 MED ORDER — FUROSEMIDE 10 MG/ML IJ SOLN
40.0000 mg | Freq: Once | INTRAMUSCULAR | Status: AC
  Administered 2021-03-01: 40 mg via INTRAVENOUS
  Filled 2021-03-01: qty 4

## 2021-03-01 MED ORDER — POTASSIUM CHLORIDE 10 MEQ/100ML IV SOLN
10.0000 meq | Freq: Once | INTRAVENOUS | Status: AC
  Administered 2021-03-01: 10 meq via INTRAVENOUS

## 2021-03-01 MED ORDER — DIPHENHYDRAMINE HCL 25 MG PO CAPS
25.0000 mg | ORAL_CAPSULE | Freq: Four times a day (QID) | ORAL | Status: DC | PRN
  Administered 2021-03-01 – 2021-03-03 (×2): 25 mg via ORAL
  Filled 2021-03-01 (×2): qty 1

## 2021-03-01 MED ORDER — INSULIN GLARGINE-YFGN 100 UNIT/ML ~~LOC~~ SOLN
12.0000 [IU] | Freq: Every day | SUBCUTANEOUS | Status: DC
  Administered 2021-03-01 – 2021-03-03 (×3): 12 [IU] via SUBCUTANEOUS
  Filled 2021-03-01 (×5): qty 0.12

## 2021-03-01 MED ORDER — BACLOFEN 10 MG PO TABS
5.0000 mg | ORAL_TABLET | Freq: Three times a day (TID) | ORAL | Status: DC | PRN
  Administered 2021-03-02: 5 mg via ORAL
  Filled 2021-03-01: qty 1

## 2021-03-01 MED ORDER — POTASSIUM CHLORIDE CRYS ER 20 MEQ PO TBCR
40.0000 meq | EXTENDED_RELEASE_TABLET | Freq: Once | ORAL | Status: AC
  Administered 2021-03-01: 40 meq via ORAL
  Filled 2021-03-01: qty 2

## 2021-03-01 MED ORDER — MAGNESIUM SULFATE 4 GM/100ML IV SOLN
4.0000 g | Freq: Once | INTRAVENOUS | Status: AC
  Administered 2021-03-01: 4 g via INTRAVENOUS
  Filled 2021-03-01: qty 100

## 2021-03-01 NOTE — Progress Notes (Signed)
Subjective: CBI stopped yesterday, no clots or issues with catheter. Still having some left flank discomfort. No acute events overnight.  Objective: Vital signs in last 24 hours: Temp:  [97.6 F (36.4 C)-98.7 F (37.1 C)] 97.8 F (36.6 C) (09/13 0000) Pulse Rate:  [72-99] 90 (09/13 0645) Resp:  [13-27] 18 (09/13 0645) BP: (81-147)/(42-115) 135/73 (09/13 0645) SpO2:  [86 %-100 %] 100 % (09/13 0645) Weight:  [78.4 kg] 78.4 kg (09/13 0530)  Intake/Output from previous day: 09/12 0701 - 09/13 0700 In: 4754.9 [I.V.:825.5; IV Piggyback:929.4] Out: 7400 [Urine:7400] Intake/Output this shift: No intake/output data recorded.  Physical Exam:  General: Alert and oriented Breathing comfortably with nasal cannula Catheter is draining straw colored urine.  Lab Results: Recent Labs    02/27/21 0242 02/28/21 0254 03/01/21 0219  HGB 9.0* 10.4* 9.6*  HCT 26.4* 30.4* 27.2*    BMET Recent Labs    02/28/21 0254 03/01/21 0219  NA 132* 132*  K 3.3* 2.9*  CL 96* 95*  CO2 17* 24  GLUCOSE 269* 254*  BUN 40* 30*  CREATININE 2.03* 1.50*  CALCIUM 8.1* 7.8*      Studies/Results: DG Abd 1 View  Result Date: 02/28/2021 CLINICAL DATA:  Fever and chills EXAM: ABDOMEN - 1 VIEW COMPARISON:  Ultrasound from the previous day FINDINGS: Scattered large and small bowel gas is noted. No abnormal mass or abnormal calcifications are seen. Left ureteral stent is noted in satisfactory position. No acute bony abnormality is noted. IMPRESSION: No acute abnormality noted. Electronically Signed   By: Inez Catalina M.D.   On: 02/28/2021 11:38   US Abdomen Complete  Result Date: 02/27/2021 CLINICAL DATA:  Cirrhosis EXAM: ABDOMEN ULTRASOUND COMPLETE COMPARISON:  02/23/2021 FINDINGS: Gallbladder: Echogenic sludge is identified within the gallbladder. No shadowing gallstones or evidence of cholecystitis. No gallbladder wall thickening. Common bile duct: Diameter: 2 mm Liver: No focal lesion identified.  Within normal limits in parenchymal echogenicity. Portal vein is patent on color Doppler imaging with normal direction of blood flow towards the liver. IVC: No abnormality visualized. Pancreas: Visualized portion unremarkable. Spleen: Size and appearance within normal limits. Right Kidney: Length: 12.9 cm. Echogenicity within normal limits. No mass or hydronephrosis visualized. Left Kidney: Length: 13.5 cm. There is prominence of the left renal pelvis without frank hydronephrosis. The left ureteral stent seen previously is not identified. Cystic area within the lower pole left kidney measures 2.4 x 2.5 x 1.5 cm, and could reflect a focal dilated calyx or peripelvic cyst. No shadowing gallstones. Echogenicity within normal limits. Abdominal aorta: No aneurysm visualized. Other findings: Small right pleural effusion is incidentally noted. IMPRESSION: 1. Mild prominence of the left renal pelvis, without frank hydronephrosis. The left ureteral stent seen previously is not identified on this exam. 2. Focally dilated lower pole left renal calyx versus peripelvic cyst, measuring up to 2.5 cm. 3. Gallbladder sludge. No evidence of cholelithiasis or cholecystitis. Electronically Signed   By: Randa Ngo M.D.   On: 02/27/2021 20:23   DG CHEST PORT 1 VIEW  Result Date: 02/27/2021 CLINICAL DATA:  Shortness of breath EXAM: PORTABLE CHEST 1 VIEW COMPARISON:  02/26/2021, 02/23/2021 FINDINGS: Electronic device over left chest. Partially visualized cervical hardware. Slight decreased asymmetric right greater than left pulmonary airspace disease. Stable cardiomediastinal silhouette with aortic atherosclerosis. No pneumothorax IMPRESSION: Slightly improved aeration since 02/26/2021 with decreased asymmetric right greater than left pulmonary airspace disease. Electronically Signed   By: Donavan Foil M.D.   On: 02/27/2021 15:41   ECHOCARDIOGRAM COMPLETE  Result Date: 02/28/2021    ECHOCARDIOGRAM REPORT   Patient Name:    Kristopher [name] Date of Exam: 02/28/2021 Medical Rec #:  [id number]    Height:       67.0 in Accession #:    [id number]   Weight:       159.8 lb Date of Birth:  01-04-1951     BSA:          1.838 m Patient Age:    43 years     BP:           119/68 mmHg Patient Gender: M            HR:           81 bpm. Exam Location:  Inpatient Procedure: 2D Echo, Cardiac Doppler, Color Doppler and Intracardiac            Opacification Agent Indications:    Dyspnea R06.00  History:        Patient has prior history of Echocardiogram examinations, most                 recent 09/19/2019. CAD and Previous Myocardial Infarction,                 Arrythmias:Atrial Fibrillation and Atrial Flutter; Risk                 Factors:Dyslipidemia, Diabetes and Sleep Apnea. 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 Fevers, chills, nausea, vomiting, and abdominal pain.                 Acute Hypoxic Respiratory Failure, pneumonia. Severe sepsis                 secondary to UTI.  Sonographer:    Darlina Sicilian RDCS Referring Phys: [id number] [place]  1. Left ventricular ejection fraction, by estimation, is 50 to 55%. The left ventricle has low normal function. The left ventricle demonstrates regional wall motion abnormalities (see scoring diagram/findings for description). There is mild left ventricular hypertrophy. Left ventricular diastolic parameters are consistent with Grade II diastolic dysfunction (pseudonormalization). Elevated left ventricular end-diastolic pressure. There is moderate hypokinesis of the left ventricular, basal inferior wall and inferoseptal wall.  2. Right ventricular systolic function is low normal. The right ventricular size is normal.  3. Effusion measures up to 1.17 cm posterior to the LV. a small pericardial effusion is present. The pericardial effusion is  posterior to the left ventricle. There is no evidence of cardiac tamponade.  4. The mitral valve is abnormal. Mild mitral valve regurgitation.  5. The aortic valve is tricuspid. Aortic valve regurgitation is not visualized. No aortic stenosis is present.  6. The inferior vena cava is dilated in size with <50% respiratory variability, suggesting right atrial pressure of 15 mmHg. Comparison(s): Changes from prior study are noted. 09/19/2019: LVEF 55-60%, moderate pericardial effusion, no tamponade. FINDINGS  Left Ventricle: Left ventricular ejection fraction, by estimation, is 50 to 55%. The left ventricle has low normal function. The left ventricle demonstrates regional wall motion abnormalities. Moderate hypokinesis of the left ventricular, basal inferior  wall and  inferoseptal wall. Definity contrast agent was given IV to delineate the left ventricular endocardial borders. The left ventricular internal cavity size was normal in size. There is mild left ventricular hypertrophy. Left ventricular diastolic parameters are consistent with Grade II diastolic dysfunction (pseudonormalization). Elevated left ventricular end-diastolic pressure. Right Ventricle: The right ventricular size is normal. No increase in right ventricular wall thickness. Right ventricular systolic function is low normal. Left Atrium: Left atrial size was normal in size. Right Atrium: Right atrial size was normal in size. Pericardium: Effusion measures up to 1.17 cm posterior to the LV. A small pericardial effusion is present. The pericardial effusion is posterior to the left ventricle. There is no evidence of cardiac tamponade. Mitral Valve: The mitral valve is abnormal. There is mild thickening of the mitral valve leaflet(s). There is mild calcification of the mitral valve leaflet(s). Mild mitral valve regurgitation. Tricuspid Valve: The tricuspid valve is grossly normal. Tricuspid valve regurgitation is trivial. Aortic Valve: The aortic valve is  tricuspid. Aortic valve regurgitation is not visualized. No aortic stenosis is present. Pulmonic Valve: The pulmonic valve was normal in structure. Pulmonic valve regurgitation is not visualized. Aorta: The aortic root and ascending aorta are structurally normal, with no evidence of dilitation. Venous: The inferior vena cava is dilated in size with less than 50% respiratory variability, suggesting right atrial pressure of 15 mmHg. IAS/Shunts: No atrial level shunt detected by color flow Doppler.  LEFT VENTRICLE PLAX 2D LVIDd:         5.20 cm      Diastology LVIDs:         3.90 cm      LV e' medial:    6.83 cm/s LV PW:         0.90 cm      LV E/e' medial:  15.2 LV IVS:        0.90 cm      LV e' lateral:   9.02 cm/s LVOT diam:     1.90 cm      LV E/e' lateral: 11.5 LV SV:         32 LV SV Index:   18 LVOT Area:     2.84 cm  LV Volumes (MOD) LV vol d, MOD A2C: 89.0 ml LV vol d, MOD A4C: 166.0 ml LV vol s, MOD A2C: 89.1 ml LV vol s, MOD A4C: 50.5 ml LV SV MOD A2C:     -0.1 ml LV SV MOD A4C:     166.0 ml LV SV MOD BP:      63.3 ml RIGHT VENTRICLE RV S prime:     10.40 cm/s TAPSE (M-mode): 1.4 cm LEFT ATRIUM             Index       RIGHT ATRIUM           Index LA diam:        3.50 cm 1.90 cm/m  RA Area:     11.50 cm LA Vol (A2C):   46.2 ml 25.13 ml/m RA Volume:   23.00 ml  12.51 ml/m LA Vol (A4C):   50.0 ml 27.20 ml/m LA Biplane Vol: 52.2 ml 28.39 ml/m  AORTIC VALVE LVOT Vmax:   83.30 cm/s LVOT Vmean:  41.900 cm/s LVOT VTI:    0.114 m  AORTA Ao Root diam: 3.10 cm Ao Asc diam:  3.00 cm MITRAL VALVE MV Area (PHT): 4.60 cm     SHUNTS MV Decel Time: 165 msec  Systemic VTI:  0.11 m MV E velocity: 104.00 cm/s  Systemic Diam: 1.90 cm MV A velocity: 47.10 cm/s MV E/A ratio:  2.21 Lyman Bishop MD Electronically signed by Lyman Bishop MD Signature Date/Time: 02/28/2021/1:01:42 PM    Final     Assessment/Plan: 1.  Patient has gross hematuria which initially was from kidney surgery.  That bleeding is stopped.  He does  have an organized clot within his bladder which will not be able to irrigate through the Foley catheter.  To remove this he would not need to go to the operating room.  However, over time this will dissolve and come out on its own.  Urine appears to be clearing, would expect that he'll continue to have brown urine until the clot dissolves.  Will consider voiding trial tomorrow.   2.  The patient's ultrasound demonstrated some lower pole pelvic caliectasis, but otherwise no hydroureteronephrosis or stones.  He did have some clot within the bladder, this is likely lysing and improving.  The stent was not well visualized, but did appear to be unchanged in position compared paired to the CAT scan that he had upon admission on the [facility].  The patient's acute kidney injury does not appear to be obstructive in nature.  3.  The patient is recovering from urosepsis and is on meropenem.  We will continue this per ID.    LOS: 6 days   [name] 03/01/2021, 7:17 AM

## 2021-03-01 NOTE — Progress Notes (Signed)
Subjective: No new complaints   Antibiotics:  Anti-infectives (From admission, onward)    Start     Dose/Rate Route Frequency Ordered Stop   02/24/21 1000  meropenem (MERREM) 1 g in sodium chloride 0.9 % 100 mL IVPB        1 g 200 mL/hr over 30 Minutes Intravenous Every 12 hours 02/24/21 0957 03/03/21 0959   02/24/21 0600  ceFEPIme (MAXIPIME) 2 g in sodium chloride 0.9 % 100 mL IVPB  Status:  Discontinued        2 g 200 mL/hr over 30 Minutes Intravenous Every 12 hours 02/23/21 2124 02/24/21 0957   02/23/21 2130  ceFEPIme (MAXIPIME) 2 g in sodium chloride 0.9 % 100 mL IVPB  Status:  Discontinued        2 g 200 mL/hr over 30 Minutes Intravenous  Once 02/23/21 2036 02/23/21 2037   02/23/21 1630  ceFEPIme (MAXIPIME) 2 g in sodium chloride 0.9 % 100 mL IVPB        2 g 200 mL/hr over 30 Minutes Intravenous  Once 02/23/21 1620 02/23/21 1706       Medications: Scheduled Meds:  (feeding supplement) PROSource Plus  30 mL Oral BID BM   sodium chloride   Intravenous Once   sodium chloride   Intravenous Once   buPROPion  150 mg Oral Daily   Chlorhexidine Gluconate Cloth  6 each Topical Daily   feeding supplement  237 mL Oral Q24H   gabapentin  100 mg Oral TID   insulin aspart  0-15 Units Subcutaneous TID WC   insulin aspart  0-5 Units Subcutaneous QHS   insulin glargine-yfgn  12 Units Subcutaneous Daily   mouth rinse  15 mL Mouth Rinse BID   metoprolol tartrate  50 mg Oral BID   multivitamins with iron  1 tablet Oral Daily   niacin  500 mg Oral Daily   pantoprazole  40 mg Oral BID   rOPINIRole  1 mg Oral QHS   rosuvastatin  5 mg Oral QPM   sucralfate  1 g Oral TID WC & HS   tamsulosin  0.4 mg Oral QPM   thiamine  100 mg Oral Daily   Continuous Infusions:  sodium chloride 10 mL/hr at 03/01/21 1800   sodium chloride Stopped (02/28/21 1156)   amiodarone 30 mg/hr (03/01/21 1800)   meropenem (MERREM) IV Stopped (03/01/21 1013)   promethazine (PHENERGAN) injection (IM  or IVPB) Stopped (02/28/21 1001)   sodium chloride irrigation Stopped (02/28/21 1908)   PRN Meds:.sodium chloride, sodium chloride, baclofen, diphenhydrAMINE, metoprolol tartrate, nitroGLYCERIN, ondansetron (ZOFRAN) IV, promethazine (PHENERGAN) injection (IM or IVPB), traMADol    Objective: Weight change:   Intake/Output Summary (Last 24 hours) at 03/01/2021 1907 Last data filed at 03/01/2021 1800 Gross per 24 hour  Intake 1553.4 ml  Output 5125 ml  Net -3571.6 ml   Blood pressure 119/77, pulse 89, temperature 98.3 F (36.8 C), temperature source Oral, resp. rate 19, height 5' 7"  (1.702 m), weight 78.4 kg, SpO2 95 %. Temp:  [97.8 F (36.6 C)-98.7 F (37.1 C)] 98.3 F (36.8 C) (09/13 1839) Pulse Rate:  [71-99] 89 (09/13 1839) Resp:  [13-27] 19 (09/13 1839) BP: (81-147)/(42-82) 119/77 (09/13 1800) SpO2:  [86 %-100 %] 95 % (09/13 1839) Weight:  [78.4 kg] 78.4 kg (09/13 0530)  Physical Exam: Physical Exam Constitutional:      Appearance: He is well-developed.  HENT:     Head: Normocephalic and atraumatic.  Eyes:  Conjunctiva/sclera: Conjunctivae normal.  Cardiovascular:     Rate and Rhythm: Normal rate and regular rhythm.  Pulmonary:     Effort: Pulmonary effort is normal. No respiratory distress.     Breath sounds: Normal breath sounds. No stridor. No wheezing.  Abdominal:     General: There is no distension.     Palpations: Abdomen is soft.  Musculoskeletal:        General: Normal range of motion.     Cervical back: Normal range of motion and neck supple.  Skin:    General: Skin is warm and dry.     Coloration: Skin is pale.     Findings: No erythema or rash.  Neurological:     General: No focal deficit present.     Mental Status: He is alert and oriented to person, place, and time.  Psychiatric:        Mood and Affect: Mood normal.        Behavior: Behavior normal.        Thought Content: Thought content normal.        Judgment: Judgment normal.      CBC:    BMET Recent Labs    02/28/21 0254 03/01/21 0219  NA 132* 132*  K 3.3* 2.9*  CL 96* 95*  CO2 17* 24  GLUCOSE 269* 254*  BUN 40* 30*  CREATININE 2.03* 1.50*  CALCIUM 8.1* 7.8*     Liver Panel  Recent Labs    02/27/21 1658  PROT 5.8*  ALBUMIN 2.3*  AST 38  ALT 30  ALKPHOS 66  BILITOT 1.2  BILIDIR 0.4*  IBILI 0.8       Sedimentation Rate No results for input(s): ESRSEDRATE in the last 72 hours. C-Reactive Protein No results for input(s): CRP in the last 72 hours.  Micro Results: Recent Results (from the past 720 hour(s))  Urine Culture     Status: None   Collection Time: 01/31/21 10:01 AM   Specimen: Urine, Clean Catch  Result Value Ref Range Status   Specimen Description   Final    URINE, CLEAN CATCH Performed at [facility], [place] [address], [place], [place] [address]    Special Requests   Final    NONE Performed at [facility], [place] [address], [place], [place] [address]    Culture   Final    NO GROWTH Performed at [facility], [place] [address] [place], [place] [address]    Report Status 02/01/2021 FINAL  Final  SARS Coronavirus 2 (TAT 6-24 hrs)     Status: None   Collection Time: 02/10/21 12:00 AM  Result Value Ref Range Status   SARS Coronavirus 2 RESULT: NEGATIVE  Final    Comment: RESULT: NEGATIVESARS-CoV-2 INTERPRETATION:A NEGATIVE  test result means that SARS-CoV-2 RNA was not present in the specimen above the limit of detection of this test. This does not preclude a possible SARS-CoV-2 infection and should not be used as the  sole basis for patient management decisions. Negative results must be combined with clinical observations, patient history, and epidemiological information. Optimum specimen types and timing for peak viral levels during infections caused by SARS-CoV-2  have not been determined. Collection of multiple specimens or types of specimens may be necessary to detect virus.  Improper specimen collection and handling, sequence variability under primers/probes, or organism present below the limit of detection may  lead to false negative results. Positive and negative predictive values of testing are highly dependent  on prevalence. False negative test results are more likely when prevalence of disease is high.The expected result is NEGATIVE.Fact S heet for  Healthcare Providers: LocalChronicle.no Sheet for Patients: [email or web address].es Reference Range - Negative   Urine Culture     Status: Abnormal   Collection Time: 02/19/21 11:59 PM   Specimen: Urine, Clean Catch  Result Value Ref Range Status   Specimen Description   Final    URINE, CLEAN CATCH Performed at [facility], [place] [address] [place], [place] [address]    Special Requests   Final    NONE Performed at [facility], [place] [address], [place], [place] [address]    Culture MULTIPLE SPECIES PRESENT, SUGGEST RECOLLECTION (A)  Final   Report Status 02/21/2021 FINAL  Final  Blood Culture (routine x 2)     Status: Abnormal   Collection Time: 02/23/21  4:05 PM   Specimen: BLOOD RIGHT FOREARM  Result Value Ref Range Status   Specimen Description   Final    BLOOD RIGHT FOREARM Performed at [place] [address], [place], [place] [address]    Special Requests   Final    BOTTLES DRAWN AEROBIC AND ANAEROBIC Blood Culture adequate volume Performed at [place] [address], [place], [place] [address]    Culture  Setup Time   Final    GRAM NEGATIVE RODS IN BOTH AEROBIC AND ANAEROBIC BOTTLES CRITICAL RESULT CALLED TO, READ BACK BY AND VERIFIED WITH: PHARMD M BELL [id number] AT 907 AM BY CM Performed at [facility], [place] [address] [place], [place] [address]    Culture (A)  Final    KLEBSIELLA PNEUMONIAE Confirmed Extended Spectrum Beta-Lactamase Producer (ESBL).   In bloodstream infections from ESBL organisms, carbapenems are preferred over piperacillin/tazobactam. They are shown to have a lower risk of mortality.    Report Status 02/26/2021 FINAL  Final   Organism ID, Bacteria KLEBSIELLA PNEUMONIAE  Final      Susceptibility   Klebsiella pneumoniae - MIC*    AMPICILLIN >=32 RESISTANT Resistant     CEFAZOLIN >=64 RESISTANT Resistant     CEFEPIME 2 SENSITIVE Sensitive     CEFTAZIDIME RESISTANT Resistant     CEFTRIAXONE >=64 RESISTANT Resistant     CIPROFLOXACIN 2 INTERMEDIATE Intermediate     GENTAMICIN >=16 RESISTANT Resistant     IMIPENEM <=0.25 SENSITIVE Sensitive     TRIMETH/SULFA >=320 RESISTANT Resistant     AMPICILLIN/SULBACTAM >=32 RESISTANT Resistant     PIP/TAZO 16 SENSITIVE Sensitive     * KLEBSIELLA PNEUMONIAE  Blood Culture ID Panel (Reflexed)     Status: Abnormal (Preliminary result)   Collection Time: 02/23/21  4:05 PM  Result Value Ref Range Status   Enterococcus faecalis NOT DETECTED NOT DETECTED Final   Enterococcus Faecium NOT DETECTED NOT DETECTED Final   Listeria monocytogenes NOT DETECTED NOT DETECTED Final   Staphylococcus species NOT DETECTED NOT DETECTED Final   Staphylococcus aureus (BCID) NOT DETECTED NOT DETECTED Final   Staphylococcus epidermidis NOT DETECTED NOT DETECTED Final   Staphylococcus lugdunensis NOT DETECTED NOT DETECTED Final   Streptococcus species NOT DETECTED NOT DETECTED Final   Streptococcus agalactiae NOT DETECTED NOT DETECTED Final   Streptococcus pneumoniae NOT DETECTED NOT DETECTED Final   Streptococcus pyogenes NOT DETECTED NOT DETECTED Final   A.calcoaceticus-baumannii NOT DETECTED NOT DETECTED Final   Bacteroides fragilis NOT DETECTED NOT DETECTED Final   Enterobacterales PENDING NOT DETECTED Incomplete   Enterobacter cloacae complex  NOT DETECTED NOT DETECTED Final   Escherichia coli NOT DETECTED NOT DETECTED Final   Klebsiella aerogenes NOT DETECTED NOT DETECTED Final   Klebsiella  oxytoca NOT DETECTED NOT DETECTED Final   Klebsiella pneumoniae DETECTED (A) NOT DETECTED Final    Comment: CRITICAL RESULT CALLED TO, READ BACK BY AND VERIFIED WITH: PHARMD M BELL [id number] AT 50 AM BY CM    Proteus species NOT DETECTED NOT DETECTED Final   Salmonella species NOT DETECTED NOT DETECTED Final   Serratia marcescens NOT DETECTED NOT DETECTED Final   Haemophilus influenzae NOT DETECTED NOT DETECTED Final   Neisseria meningitidis NOT DETECTED NOT DETECTED Final   Pseudomonas aeruginosa NOT DETECTED NOT DETECTED Final   Stenotrophomonas maltophilia NOT DETECTED NOT DETECTED Final   Candida albicans NOT DETECTED NOT DETECTED Final   Candida auris NOT DETECTED NOT DETECTED Final   Candida glabrata NOT DETECTED NOT DETECTED Final   Candida krusei NOT DETECTED NOT DETECTED Final   Candida parapsilosis NOT DETECTED NOT DETECTED Final   Candida tropicalis NOT DETECTED NOT DETECTED Final   Cryptococcus neoformans/gattii NOT DETECTED NOT DETECTED Final   CTX-M ESBL DETECTED (A) NOT DETECTED Final    Comment: CRITICAL RESULT CALLED TO, READ BACK BY AND VERIFIED WITH: PHARMD M BELL [id number] AT 907 AM BY CM (NOTE) Extended spectrum beta-lactamase detected. Recommend a carbapenem as initial therapy.      Carbapenem resistance IMP NOT DETECTED NOT DETECTED Final   Carbapenem resistance KPC NOT DETECTED NOT DETECTED Final   Carbapenem resistance NDM NOT DETECTED NOT DETECTED Final   Carbapenem resist OXA 48 LIKE NOT DETECTED NOT DETECTED Final   Carbapenem resistance VIM NOT DETECTED NOT DETECTED Final    Comment: Performed at [facility], [place] [address], [place], [place] [address]  Blood Culture (routine x 2)     Status: Abnormal   Collection Time: 02/23/21  4:24 PM   Specimen: BLOOD  Result Value Ref Range Status   Specimen Description   Final    BLOOD LEFT ANTECUBITAL Performed at [place] [address], [place], [place] [address]    Special  Requests   Final    BOTTLES DRAWN AEROBIC AND ANAEROBIC Blood Culture adequate volume Performed at [facility] [address], [place], [place] [address]    Culture  Setup Time   Final    GRAM NEGATIVE RODS AEROBIC BOTTLE ONLY CRITICAL VALUE NOTED.  VALUE IS CONSISTENT WITH PREVIOUSLY REPORTED AND CALLED VALUE.    Culture (A)  Final    KLEBSIELLA PNEUMONIAE SUSCEPTIBILITIES PERFORMED ON PREVIOUS CULTURE WITHIN THE LAST 5 DAYS. Performed at [facility], [place] [address] [place], [place] [address]    Report Status 02/26/2021 FINAL  Final  Resp Panel by RT-PCR (Flu A&B, Covid) Nasopharyngeal Swab     Status: None   Collection Time: 02/23/21  4:44 PM   Specimen: Nasopharyngeal Swab; Nasopharyngeal(NP) swabs in vial transport medium  Result Value Ref Range Status   SARS Coronavirus 2 by RT PCR NEGATIVE NEGATIVE Final    Comment: (NOTE) SARS-CoV-2 target nucleic acids are NOT DETECTED.  The SARS-CoV-2 RNA is generally detectable in upper respiratory specimens during the acute phase of infection. The lowest concentration of SARS-CoV-2 viral copies this assay can detect is 138 copies/mL. A negative result does not preclude SARS-Cov-2 infection and should not be used as the sole basis for treatment or other patient management decisions. A negative result may occur with  improper  specimen collection/handling, submission of specimen other than nasopharyngeal swab, presence of viral mutation(s) within the areas targeted by this assay, and inadequate number of viral copies(<138 copies/mL). A negative result must be combined with clinical observations, patient history, and epidemiological information. The expected result is Negative.  Fact Sheet for Patients:  EntrepreneurPulse.com.au  Fact Sheet for Healthcare Providers:  IncredibleEmployment.be  This test is no t yet approved or cleared by the [place] FDA and  has been  authorized for detection and/or diagnosis of SARS-CoV-2 by FDA under an Emergency Use Authorization (EUA). This EUA will remain  in effect (meaning this test can be used) for the duration of the COVID-19 declaration under Section 564(b)(1) of the Act, 21 U.S.C.section 360bbb-3(b)(1), unless the authorization is terminated  or revoked sooner.       Influenza A by PCR NEGATIVE NEGATIVE Final   Influenza B by PCR NEGATIVE NEGATIVE Final    Comment: (NOTE) The Xpert Xpress SARS-CoV-2/FLU/RSV plus assay is intended as an aid in the diagnosis of influenza from Nasopharyngeal swab specimens and should not be used as a sole basis for treatment. Nasal washings and aspirates are unacceptable for Xpert Xpress SARS-CoV-2/FLU/RSV testing.  Fact Sheet for Patients: EntrepreneurPulse.com.au  Fact Sheet for Healthcare Providers: IncredibleEmployment.be  This test is not yet approved or cleared by the [place] FDA and has been authorized for detection and/or diagnosis of SARS-CoV-2 by FDA under an Emergency Use Authorization (EUA). This EUA will remain in effect (meaning this test can be used) for the duration of the COVID-19 declaration under Section 564(b)(1) of the Act, 21 U.S.C. section 360bbb-3(b)(1), unless the authorization is terminated or revoked.  Performed at [facility], [place] [address], [place], [place] [address]   Urine Culture     Status: Abnormal   Collection Time: 02/23/21  4:54 PM   Specimen: In/Out Cath Urine  Result Value Ref Range Status   Specimen Description   Final    IN/OUT CATH URINE Performed at [facility] [place] [address] [place], [place] [address]    Special Requests   Final    NONE Performed at [facility], [place] [address] [place], [place] [address]    Culture (A)  Final    20,000 COLONIES/mL KLEBSIELLA PNEUMONIAE Confirmed Extended Spectrum Beta-Lactamase  Producer (ESBL).  In bloodstream infections from ESBL organisms, carbapenems are preferred over piperacillin/tazobactam. They are shown to have a lower risk of mortality. 20,000 COLONIES/mL ENTEROCOCCUS FAECALIS    Report Status 02/26/2021 FINAL  Final   Organism ID, Bacteria KLEBSIELLA PNEUMONIAE (A)  Final   Organism ID, Bacteria ENTEROCOCCUS FAECALIS (A)  Final      Susceptibility   Enterococcus faecalis - MIC*    AMPICILLIN <=2 SENSITIVE Sensitive     NITROFURANTOIN <=16 SENSITIVE Sensitive     VANCOMYCIN 2 SENSITIVE Sensitive     * 20,000 COLONIES/mL ENTEROCOCCUS FAECALIS   Klebsiella pneumoniae - MIC*    AMPICILLIN >=32 RESISTANT Resistant     CEFAZOLIN >=64 RESISTANT Resistant     CEFEPIME >=32 RESISTANT Resistant     CEFTRIAXONE >=64 RESISTANT Resistant     CIPROFLOXACIN 2 INTERMEDIATE Intermediate     GENTAMICIN >=16 RESISTANT Resistant     IMIPENEM <=0.25 SENSITIVE Sensitive     NITROFURANTOIN 128 RESISTANT Resistant     TRIMETH/SULFA >=320 RESISTANT Resistant     AMPICILLIN/SULBACTAM >=32 RESISTANT Resistant     PIP/TAZO 16 SENSITIVE Sensitive     * 20,000 COLONIES/mL KLEBSIELLA  PNEUMONIAE  MRSA Next Gen by PCR, Nasal     Status: None   Collection Time: 02/26/21 10:00 PM   Specimen: Nasal Mucosa; Nasal Swab  Result Value Ref Range Status   MRSA by PCR Next Gen NOT DETECTED NOT DETECTED Final    Comment: (NOTE) The GeneXpert MRSA Assay (FDA approved for NASAL specimens only), is one component of a comprehensive MRSA colonization surveillance program. It is not intended to diagnose MRSA infection nor to guide or monitor treatment for MRSA infections. Test performance is not FDA approved in patients less than 49 years old. Performed at [facility], [place] [address], [place], [place] [address]     Studies/Results: DG Abd 1 View  Result Date: 02/28/2021 CLINICAL DATA:  Fever and chills EXAM: ABDOMEN - 1 VIEW COMPARISON:  Ultrasound from the previous  day FINDINGS: Scattered large and small bowel gas is noted. No abnormal mass or abnormal calcifications are seen. Left ureteral stent is noted in satisfactory position. No acute bony abnormality is noted. IMPRESSION: No acute abnormality noted. Electronically Signed   By: Inez Catalina M.D.   On: 02/28/2021 11:38   DG CHEST PORT 1 VIEW  Result Date: 03/01/2021 CLINICAL DATA:  Dyspnea EXAM: PORTABLE CHEST 1 VIEW COMPARISON:  02/27/2021 FINDINGS: Cardiac shadow is stable. Loop recorder is again noted. Patchy airspace opacities are noted bilaterally but continue to improved when compare with the prior exam. No sizable effusion is noted. No bony abnormality is seen. Postsurgical changes in the cervical spine are noted. IMPRESSION: Persistent but improved airspace opacities right greater than left. Electronically Signed   By: Inez Catalina M.D.   On: 03/01/2021 11:13   ECHOCARDIOGRAM COMPLETE  Result Date: 02/28/2021    ECHOCARDIOGRAM REPORT   Patient Name:   ADRICK KESTLER Date of Exam: 02/28/2021 Medical Rec #:  [id number]    Height:       67.0 in Accession #:    [id number]   Weight:       159.8 lb Date of Birth:  February 01, 1951     BSA:          1.838 m Patient Age:    70 years     BP:           119/68 mmHg Patient Gender: M            HR:           81 bpm. Exam Location:  Inpatient Procedure: 2D Echo, Cardiac Doppler, Color Doppler and Intracardiac            Opacification Agent Indications:    Dyspnea R06.00  History:        Patient has prior history of Echocardiogram examinations, most                 recent 09/19/2019. CAD and Previous Myocardial Infarction,                 Arrythmias:Atrial Fibrillation and Atrial Flutter; Risk                 Factors:Dyslipidemia, Diabetes and Sleep Apnea. 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 Fevers, chills, nausea, vomiting, and  abdominal pain.                 Acute Hypoxic Respiratory Failure, pneumonia. Severe sepsis                 secondary to UTI.  Sonographer:    Darlina Sicilian RDCS Referring Phys: [id number] [place]  1. Left ventricular ejection fraction, by estimation, is 50 to 55%. The left ventricle has low normal function. The left ventricle demonstrates regional wall motion abnormalities (see scoring diagram/findings for description). There is mild left ventricular hypertrophy. Left ventricular diastolic parameters are consistent with Grade II diastolic dysfunction (pseudonormalization). Elevated left ventricular end-diastolic pressure. There is moderate hypokinesis of the left ventricular, basal inferior wall and inferoseptal wall.  2. Right ventricular systolic function is low normal. The right ventricular size is normal.  3. Effusion measures up to 1.17 cm posterior to the LV. a small pericardial effusion is present. The pericardial effusion is posterior to the left ventricle. There is no evidence of cardiac tamponade.  4. The mitral valve is abnormal. Mild mitral valve regurgitation.  5. The aortic valve is tricuspid. Aortic valve regurgitation is not visualized. No aortic stenosis is present.  6. The inferior vena cava is dilated in size with <50% respiratory variability, suggesting right atrial pressure of 15 mmHg. Comparison(s): Changes from prior study are noted. 09/19/2019: LVEF 55-60%, moderate pericardial effusion, no tamponade. FINDINGS  Left Ventricle: Left ventricular ejection fraction, by estimation, is 50 to 55%. The left ventricle has low normal function. The left ventricle demonstrates regional wall motion abnormalities. Moderate hypokinesis of the left ventricular, basal inferior  wall and inferoseptal wall. Definity contrast agent was given IV to delineate the left ventricular endocardial borders. The left ventricular internal cavity size was normal in size. There is mild left  ventricular hypertrophy. Left ventricular diastolic parameters are consistent with Grade II diastolic dysfunction (pseudonormalization). Elevated left ventricular end-diastolic pressure. Right Ventricle: The right ventricular size is normal. No increase in right ventricular wall thickness. Right ventricular systolic function is low normal. Left Atrium: Left atrial size was normal in size. Right Atrium: Right atrial size was normal in size. Pericardium: Effusion measures up to 1.17 cm posterior to the LV. A small pericardial effusion is present. The pericardial effusion is posterior to the left ventricle. There is no evidence of cardiac tamponade. Mitral Valve: The mitral valve is abnormal. There is mild thickening of the mitral valve leaflet(s). There is mild calcification of the mitral valve leaflet(s). Mild mitral valve regurgitation. Tricuspid Valve: The tricuspid valve is grossly normal. Tricuspid valve regurgitation is trivial. Aortic Valve: The aortic valve is tricuspid. Aortic valve regurgitation is not visualized. No aortic stenosis is present. Pulmonic Valve: The pulmonic valve was normal in structure. Pulmonic valve regurgitation is not visualized. Aorta: The aortic root and ascending aorta are structurally normal, with no evidence of dilitation. Venous: The inferior vena cava is dilated in size with less than 50% respiratory variability, suggesting right atrial pressure of 15 mmHg. IAS/Shunts: No atrial level shunt detected by color flow Doppler.  LEFT VENTRICLE PLAX 2D LVIDd:         5.20 cm      Diastology LVIDs:         3.90 cm      LV e' medial:    6.83 cm/s LV PW:         0.90 cm      LV E/e' medial:  15.2 LV IVS:        0.90 cm      LV e' lateral:   9.02 cm/s LVOT diam:     1.90 cm      LV E/e' lateral: 11.5 LV SV:         32 LV SV Index:   18 LVOT Area:     2.84 cm  LV Volumes (MOD) LV vol d, MOD A2C: 89.0 ml LV vol d, MOD A4C: 166.0 ml LV vol s, MOD A2C: 89.1 ml LV vol s, MOD A4C: 50.5 ml LV SV  MOD A2C:     -0.1 ml LV SV MOD A4C:     166.0 ml LV SV MOD BP:      63.3 ml RIGHT VENTRICLE RV S prime:     10.40 cm/s TAPSE (M-mode): 1.4 cm LEFT ATRIUM             Index       RIGHT ATRIUM           Index LA diam:        3.50 cm 1.90 cm/m  RA Area:     11.50 cm LA Vol (A2C):   46.2 ml 25.13 ml/m RA Volume:   23.00 ml  12.51 ml/m LA Vol (A4C):   50.0 ml 27.20 ml/m LA Biplane Vol: 52.2 ml 28.39 ml/m  AORTIC VALVE LVOT Vmax:   83.30 cm/s LVOT Vmean:  41.900 cm/s LVOT VTI:    0.114 m  AORTA Ao Root diam: 3.10 cm Ao Asc diam:  3.00 cm MITRAL VALVE MV Area (PHT): 4.60 cm     SHUNTS MV Decel Time: 165 msec     Systemic VTI:  0.11 m MV E velocity: 104.00 cm/s  Systemic Diam: 1.90 cm MV A velocity: 47.10 cm/s MV E/A ratio:  2.21 Lyman Bishop MD Electronically signed by Lyman Bishop MD Signature Date/Time: 02/28/2021/1:01:42 PM    Final       Assessment/Plan:  INTERVAL HISTORY:  patient feeling better   Principal Problem:   Severe Sepsis secondary to UTI [facility]) Active Problems:   Coronary artery disease involving native coronary artery of native heart without angina pectoris   Atrial fibrillation s/p PVI ablation at [facility] 2001   Obstructive sleep apnea syndrome   Cirrhosis of liver 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 male with recent cystoscopy in August 26 with nephrolithotomy of large kidney stone and left ureteral stent placement, complicated by urinary retention need for Foley catheter placement which then became plugged and then needed to be replaced then admitted with complicated urinary tract infection with ESBL Klebsiella pneumonia and Enterococcus in urine and Klebsiella pneumonia, ESBL in bloodstream.  His course was also been  complicated by hypoxia and atrial fibrillation with rapid ventricular response  #1 complicated urinary tract infection with ESBL Klebsiella and Enterococcus in urine with Klebsiella having seeded blood:  Continue meropenem through 7 days then stop and observe off antibiotics   #2  Atrial fibrillation: Currently on diltiazem and amiodarone  #3 hypoxia is improving     LOS: 6 days   Alcide Evener 03/01/2021, 7:07 PM

## 2021-03-01 NOTE — Progress Notes (Signed)
PCCM Brief Progress Note  Oxygenation is improved, Hb stable, cardiology following for AF/RVR. We will sign off but we are glad to be reinvolved as condition changes.  Roundup

## 2021-03-01 NOTE — Progress Notes (Signed)
PROGRESS NOTE    Kristopher [name]  OYD:[id number] DOB: 1950-10-03 DOA: 02/23/2021 PCP: Townsend Roger, MD    Brief Narrative:  [name] was admitted to the hospital with the working diagnosis of severe sepsis due to urinary tract infection, Klebsiella bacteremia, ESBL, Hospitalization complicated with atrial fibrillation with RVR, acute heart failure and ARDS with acute hypoxemic respiratory failure and acute metabolic encephalopathy.    70 year old male past medical history for atrial fibrillation, coronary artery disease, nonalcoholic cirrhosis, dyslipidemia, type 2 diabetes mellitus, and urolithiasis who presented with fevers, chills and hematuria. 8/26 patient underwent cystoscopy, left retrograde pyelogram with interpretation, left percutaneous renal access, left nephrolithotomy, left nephrostogram, left ureteral stent. Postprocedure he had a Foley catheter placed for urinary retention.  On 9/3 patient returned to the ED because urinary retention.  Apparently his Foley catheter was blocked by blood clot at home and he removed it himself.  Foley catheter was replaced and the patient was discharged home.  Patient continued to have fevers, chills along with back pain at home that prompted him to come back to the hospital.  On his initial physical examination his temperature was 103 F, blood pressure 87/59, heart rate 155, respiratory 28, oxygen saturation 72% on room air.  He was awake and alert, dry mucous membranes, lungs clear to auscultation bilaterally, heart S1-S2, tachycardic, abdomen soft nontender.   Sodium 136, potassium 4.1, chloride 101, bicarb 24, glucose 235, BUN 35, creatinine 2.1, AST 113, ALT 27, high sensitive troponin 39, lactic acid 1.2, white count 9.0, hemoglobin 6.8, hematocrit 20.1, platelets 125. SARS COVID-19 negative.   Urinalysis > 50 white cells, > 50 red cells.  Brown color, turbid.   CT of the abdomen pelvis left double-J ureteral stent in place.  No hydroureter.   Several mildly dilated loops of small bowel in the upper quadrant, no obvious transition to indicate obstruction.   Chest radiograph no infiltrates.   EKG 107 bpm, normal axis, normal intervals, sinus rhythm, no significant ST segment changes, negative T waves V4-V6.   Patient was placed on antibiotic therapy and IV fluids.   Culture positive for multidrug resistant Klebsiella.   09/09 patient developed atrial fibrillation with RVR, placed on diltiazem infusion, converting to sinus rhythm.  PRBC transfusion for acute blood loss anemia due to hematuria.    Patient transferred to step down unit on 09/10, because difficult to control atrial fibrillation with RVR along with altered mental status. CVA work up with heat CT and brain MRI negative.    09/11 difficult to control atrial fibrillation, positive signs of hypoperfusion. Patient started ton amiodarone infusion converting to sinus rhythm.    Assessment & Plan:   Principal Problem:   Severe Sepsis secondary to UTI [facility]) Active Problems:   Coronary artery disease involving native coronary artery of native heart without angina pectoris   Atrial fibrillation s/p PVI ablation at [facility] 2001   Obstructive sleep apnea syndrome   Cirrhosis of liver without ascites (HCC)   Type 2 diabetes mellitus without complications (HCC)   AKI (acute kidney injury) ([place])   Anemia   Thrombocytopenia (HCC)   Sepsis with acute renal failure without septic shock (HCC)   Hematuria     Severe sepsis due to urinary tract infection (complicated) in the setting of indwelling foley catheter. (Gram negative bacteremia). Suspected ARDS acute hypoxemic respiratory failure.  Urine culture and blood culture positive for Klebsiella ESBL. CT with no evident abscess.    Wbc is 9,2 today.  Antibiotic therapy with meropenem #6/7.  His hemodynamics have improved with amiodarone.  Continue to have dyspnea and need of supplemental 02, today on 5 L/min per [place].   Continuous bladder irrigation has been discontinued, urine dark brown but no frank hematuria.     Pulmonary infiltrates possible ARDS vs pulmonary edema, also consideration for transfusion related acute lung injury.    Plan to complete antibiotic therapy 7 days total. Check chest film follow up on infiltrates, keep oxygen saturation more than 92%.  Ok to transfer to telemetry when off diltiazem drip.    2. Acute blood loss anemia due to hematuria. thrombocytopenia Sp 2 units PRBC transfusion.   No signs of further bleeding.  Follow up hgb today is 9,6 and hct 27,2 with Plt 189. Continue close monitoring of cell count.    3. AKI hyponatremia/ hypokalemia/ hypomagnesemia/ anion gap metabolic acidosis.  Patient had furosemide yesterday 40 mg Iv, urine output 7,400 cc.  Renal function this am with serum cr at 1,50 with K at 2,9, Na 132, bicarbonate 24 and MG 1,6.  Continue K correction with Kcl and Mg correction with mag sulfate. Hold on diuresis today and will follow renal function in am. Keep K at 4 and Mg at 2.    4. Atrial fibrillation (sp ablation in the past) RVR with acute diastolic heart failure and acute hypoxemic respiratory failure .  Patient has converted to sinus rhythm, telemetry personally reviewed HR 60 to 90 with occasional PVC.   Echocardiogram with preserved LV systolic function Ef 50 to 55%, Moderate hypokinesis of the left ventricular, basal inferior wall and inferoseptal wall. Small pericardial effusion.   Plan to continue amiodarone to keep rate and rhythm control. Wean off diltiazem drip. Continue metoprolol 50 mg po bid.  No anticoagulation for now due to recent bleeding.    5. New metabolic encephalopathy. Work up with head CT and brain MRI negative for CVA,    Patient very weak and deconditioned, somnolent.  On low dose of gabapentin, continue with thiamine and multivitamins.  Back pain control with tramadol and baclofen Continue bupropion for  depression.    5. T2DM/ dyslipidemia.  Fasting glucose this am 254, capillary 218, 220, 175 and 226.    Plan to increase basal insulin to  12 units and continue sliding scale aspart insulin for glucose cover and monitoring.    6. Acute gastritis.  Continue with pantoprazole and sucralfate.  Poor appetite.   Patient continue to be at high risk for worsening sepsis   Status is: Inpatient  Remains inpatient appropriate because:Inpatient level of care appropriate due to severity of illness  Dispo: The patient is from: Home              Anticipated d/c is to: Home              Patient currently is not medically stable to d/c.   Difficult to place patient No   DVT prophylaxis: Scd   Code Status:    full  Family Communication:  I spoke with patient's wife at the bedside, we talked in detail about patient's condition, plan of care and prognosis and all questions were addressed.      Nutrition Status: Nutrition Problem: Increased nutrient needs Etiology: acute illness (sepsis) Signs/Symptoms: estimated needs Interventions: Ensure Enlive (each supplement provides 350kcal and 20 grams of protein), Prostat, Magic cup, MVI    Consultants:  Cardiology  Urology   Antimicrobials:  Meropenem.    Subjective: Patient somnolent  this am, but easy to arouse, complains of dyspnea. Positive back pain, no nausea or vomiting.,   Objective: Vitals:   03/01/21 0630 03/01/21 0645 03/01/21 0700 03/01/21 0730  BP: 128/74 135/73 122/63 138/67  Pulse: 88 90 90 89  Resp: 16 18 (!) 26 16  Temp:    98.4 F (36.9 C)  TempSrc:    Axillary  SpO2: 100% 100% 92% 99%  Weight:      Height:        Intake/Output Summary (Last 24 hours) at 03/01/2021 0834 Last data filed at 03/01/2021 0746 Gross per 24 hour  Intake 4859.39 ml  Output 6300 ml  Net -1440.61 ml   Filed Weights   02/23/21 1541 03/01/21 0530  Weight: 72.5 kg 78.4 kg    Examination:   General: deconditioned and ill looking  appearing  Neurology: somnolent but easy to arouse E ENT: mild pallor, no icterus, oral mucosa dry Cardiovascular: No JVD. S1-S2 present, rhythmic, no gallops, rubs, or murmurs. No lower extremity edema. Pulmonary: positive breath sounds bilaterally, with no wheezing, or rhonchi, scattered rales. Gastrointestinal. Abdomen soft and non tender, mild distended Skin. No rashes Musculoskeletal: no joint deformities     Data Reviewed: I have personally reviewed following labs and imaging studies  CBC: Recent Labs  Lab 02/23/21 1605 02/24/21 0501 02/25/21 0332 02/25/21 1718 02/26/21 0933 02/27/21 0242 02/28/21 0254 03/01/21 0219  WBC 9.0   < > 4.2 6.0 8.4 8.2 11.5* 9.2  NEUTROABS 8.4*  --  3.8  --   --  7.0 10.0* 7.8*  HGB 6.8*   < > 7.6* 8.8* 9.4* 9.0* 10.4* 9.6*  HCT 20.1*   < > 22.2* 25.7* 27.6* 26.4* 30.4* 27.2*  MCV 95.3   < > 91.0 90.2 89.3 90.1 89.7 87.7  PLT 125*   < > 93* 118* 139* 121* 181 189   < > = values in this interval not displayed.   Basic Metabolic Panel: Recent Labs  Lab 02/25/21 0332 02/26/21 0344 02/26/21 0933 02/27/21 0242 02/27/21 1435 02/28/21 0254 03/01/21 0219  NA 128*  --  132* 130*  --  132* 132*  K 3.6  --  3.4* 3.2*  --  3.3* 2.9*  CL 96*  --  102 97*  --  96* 95*  CO2 21*  --  16* 17*  --  17* 24  GLUCOSE 177*  --  203* 231*  --  269* 254*  BUN 35*  --  40* 40*  --  40* 30*  CREATININE 1.59* 1.87* 1.93* 2.16*  --  2.03* 1.50*  CALCIUM 7.1*  --  7.6* 7.4*  --  8.1* 7.8*  MG  --   --   --   --  1.9 1.8 1.6*  PHOS  --   --   --  2.2*  --   --   --    GFR: Estimated Creatinine Clearance: 44.1 mL/min (A) (by C-G formula based on SCr of 1.5 mg/dL (H)). Liver Function Tests: Recent Labs  Lab 02/23/21 1605 02/24/21 0501 02/27/21 1658  AST 113* 174* 38  ALT 27 47* 30  ALKPHOS 65 62 66  BILITOT 1.4* 1.4* 1.2  PROT 6.1* 5.9* 5.8*  ALBUMIN 3.3* 2.8* 2.3*   No results for input(s): LIPASE, AMYLASE in the last 168 hours. Recent Labs   Lab 02/26/21 1652 02/28/21 0254  AMMONIA 25 11   Coagulation Profile: Recent Labs  Lab 02/23/21 1605 02/24/21 0501  INR 1.2 1.2  Cardiac Enzymes: No results for input(s): CKTOTAL, CKMB, CKMBINDEX, TROPONINI in the last 168 hours. BNP (last 3 results) No results for input(s): PROBNP in the last 8760 hours. HbA1C: No results for input(s): HGBA1C in the last 72 hours. CBG: Recent Labs  Lab 02/28/21 0738 02/28/21 1147 02/28/21 1659 02/28/21 2147 03/01/21 0736  GLUCAP 259* 218* 220* 175* 226*   Lipid Profile: No results for input(s): CHOL, HDL, LDLCALC, TRIG, CHOLHDL, LDLDIRECT in the last 72 hours. Thyroid Function Tests: Recent Labs    02/27/21 1706  TSH 2.473   Anemia Panel: No results for input(s): VITAMINB12, FOLATE, FERRITIN, TIBC, IRON, RETICCTPCT in the last 72 hours.    Radiology Studies: I have reviewed all of the imaging during this hospital visit personally     Scheduled Meds:  (feeding supplement) PROSource Plus  30 mL Oral BID BM   sodium chloride   Intravenous Once   sodium chloride   Intravenous Once   baclofen  5 mg Oral TID   buPROPion  150 mg Oral Daily   Chlorhexidine Gluconate Cloth  6 each Topical Daily   feeding supplement  237 mL Oral Q24H   gabapentin  100 mg Oral TID   insulin aspart  0-15 Units Subcutaneous TID WC   insulin aspart  0-5 Units Subcutaneous QHS   insulin glargine-yfgn  8 Units Subcutaneous Daily   mouth rinse  15 mL Mouth Rinse BID   metoprolol tartrate  50 mg Oral BID   multivitamins with iron  1 tablet Oral Daily   niacin  500 mg Oral Daily   pantoprazole  40 mg Oral BID   rOPINIRole  1 mg Oral QHS   rosuvastatin  5 mg Oral QPM   sucralfate  1 g Oral TID WC & HS   tamsulosin  0.4 mg Oral QPM   thiamine  100 mg Oral Daily   Continuous Infusions:  sodium chloride 20 mL/hr at 03/01/21 0746   sodium chloride Stopped (02/28/21 1156)   amiodarone 30 mg/hr (03/01/21 0746)   diltiazem (CARDIZEM) infusion 2.5  mg/hr (03/01/21 0746)   meropenem (MERREM) IV Stopped (02/28/21 2219)   potassium chloride Stopped (03/01/21 0753)   potassium chloride 10 mEq (03/01/21 0755)   promethazine (PHENERGAN) injection (IM or IVPB) Stopped (02/28/21 1001)   sodium chloride irrigation Stopped (02/28/21 1908)     LOS: 6 days        Chigozie Basaldua Gerome Apley, MD

## 2021-03-01 NOTE — Progress Notes (Signed)
Progress Note  Patient Name: Kristopher [name] Date of Encounter: 03/01/2021  [place] Cardiologist: [facility]  Subjective   No acute overnight events. Patient continues to have left flank pain. No chest pain. He notes intermittent very mild shortness of breath but nothing significant. Remains in normal sinus rhythm. He feels mental status is improving - family agrees and thinks he his back to baseline.  Inpatient Medications    Scheduled Meds:  (feeding supplement) PROSource Plus  30 mL Oral BID BM   sodium chloride   Intravenous Once   sodium chloride   Intravenous Once   buPROPion  150 mg Oral Daily   Chlorhexidine Gluconate Cloth  6 each Topical Daily   feeding supplement  237 mL Oral Q24H   gabapentin  100 mg Oral TID   insulin aspart  0-15 Units Subcutaneous TID WC   insulin aspart  0-5 Units Subcutaneous QHS   insulin glargine-yfgn  12 Units Subcutaneous Daily   mouth rinse  15 mL Mouth Rinse BID   metoprolol tartrate  50 mg Oral BID   multivitamins with iron  1 tablet Oral Daily   niacin  500 mg Oral Daily   pantoprazole  40 mg Oral BID   rOPINIRole  1 mg Oral QHS   rosuvastatin  5 mg Oral QPM   sucralfate  1 g Oral TID WC & HS   tamsulosin  0.4 mg Oral QPM   thiamine  100 mg Oral Daily   Continuous Infusions:  sodium chloride Stopped (03/01/21 0943)   sodium chloride Stopped (02/28/21 1156)   amiodarone 30 mg/hr (03/01/21 1012)   diltiazem (CARDIZEM) infusion Stopped (03/01/21 0935)   meropenem (MERREM) IV Stopped (03/01/21 1013)   promethazine (PHENERGAN) injection (IM or IVPB) Stopped (02/28/21 1001)   sodium chloride irrigation Stopped (02/28/21 1908)   PRN Meds: sodium chloride, sodium chloride, baclofen, diphenhydrAMINE, metoprolol tartrate, nitroGLYCERIN, ondansetron (ZOFRAN) IV, promethazine (PHENERGAN) injection (IM or IVPB), traMADol   Vital Signs    Vitals:   03/01/21 0930 03/01/21 1000 03/01/21 1030 03/01/21 1100  BP: 137/77 129/62 (!)  105/58 132/67  Pulse: 88 84 77 71  Resp: 18 17 19 16   Temp:      TempSrc:      SpO2: 97% 96% 99% 100%  Weight:      Height:        Intake/Output Summary (Last 24 hours) at 03/01/2021 1154 Last data filed at 03/01/2021 1012 Gross per 24 hour  Intake 1604.54 ml  Output 5750 ml  Net -4145.46 ml   Last 3 Weights 03/01/2021 02/23/2021 02/19/2021  Weight (lbs) 172 lb 13.5 oz 159 lb 13.3 oz 160 lb  Weight (kg) 78.4 kg 72.5 kg 72.576 kg      Telemetry    Normal sinus rhythm with rates in the 80s to 90s. PAC, PVC, and ventricular couplets noted. - Personally Reviewed  ECG    No new ECG tracing today. - Personally Reviewed  Physical Exam   GEN: No acute distress.   Neck: No JVD. Cardiac: RRR. No murmurs, rubs, or gallops.  Respiratory: Clear to auscultation bilaterally. No wheezes, rhonchi, or rales. GI: Soft, non-tender, non-distended . MS: No lower extremity edema. No deformity. Skin: Warm and dry. Neuro: Alert and oriented x3 (mental status improving). No focal deficits. Psych: Normal affect. Responds appropriately.  Labs    High Sensitivity Troponin:   Recent Labs  Lab 02/23/21 1605 02/23/21 1820 02/28/21 0537  TROPONINIHS 39* 38* 11  Chemistry Recent Labs  Lab 02/23/21 1605 02/24/21 0501 02/25/21 0332 02/27/21 0242 02/27/21 1658 02/28/21 0254 03/01/21 0219  NA 136 136   < > 130*  --  132* 132*  K 4.1 4.3   < > 3.2*  --  3.3* 2.9*  CL 101 101   < > 97*  --  96* 95*  CO2 24 26   < > 17*  --  17* 24  GLUCOSE 235* 228*   < > 231*  --  269* 254*  BUN 35* 35*   < > 40*  --  40* 30*  CREATININE 2.11* 1.99*   < > 2.16*  --  2.03* 1.50*  CALCIUM 8.1* 8.0*   < > 7.4*  --  8.1* 7.8*  PROT 6.1* 5.9*  --   --  5.8*  --   --   ALBUMIN 3.3* 2.8*  --   --  2.3*  --   --   AST 113* 174*  --   --  38  --   --   ALT 27 47*  --   --  30  --   --   ALKPHOS 65 62  --   --  66  --   --   BILITOT 1.4* 1.4*  --   --  1.2  --   --   GFRNONAA 33* 36*   < > 33*  --  35* 50*   ANIONGAP 11 9   < > 16*  --  19* 13   < > = values in this interval not displayed.     Hematology Recent Labs  Lab 02/27/21 0242 02/28/21 0254 03/01/21 0219  WBC 8.2 11.5* 9.2  RBC 2.93* 3.39* 3.10*  HGB 9.0* 10.4* 9.6*  HCT 26.4* 30.4* 27.2*  MCV 90.1 89.7 87.7  MCH 30.7 30.7 31.0  MCHC 34.1 34.2 35.3  RDW 17.1* 17.0* 16.6*  PLT 121* 181 189    BNP Recent Labs  Lab 02/28/21 1017  BNP 472.6*     DDimer No results for input(s): DDIMER in the last 168 hours.   Radiology    DG Abd 1 View  Result Date: 02/28/2021 CLINICAL DATA:  Fever and chills EXAM: ABDOMEN - 1 VIEW COMPARISON:  Ultrasound from the previous day FINDINGS: Scattered large and small bowel gas is noted. No abnormal mass or abnormal calcifications are seen. Left ureteral stent is noted in satisfactory position. No acute bony abnormality is noted. IMPRESSION: No acute abnormality noted. Electronically Signed   By: Inez Catalina M.D.   On: 02/28/2021 11:38   US Abdomen Complete  Result Date: 02/27/2021 CLINICAL DATA:  Cirrhosis EXAM: ABDOMEN ULTRASOUND COMPLETE COMPARISON:  02/23/2021 FINDINGS: Gallbladder: Echogenic sludge is identified within the gallbladder. No shadowing gallstones or evidence of cholecystitis. No gallbladder wall thickening. Common bile duct: Diameter: 2 mm Liver: No focal lesion identified. Within normal limits in parenchymal echogenicity. Portal vein is patent on color Doppler imaging with normal direction of blood flow towards the liver. IVC: No abnormality visualized. Pancreas: Visualized portion unremarkable. Spleen: Size and appearance within normal limits. Right Kidney: Length: 12.9 cm. Echogenicity within normal limits. No mass or hydronephrosis visualized. Left Kidney: Length: 13.5 cm. There is prominence of the left renal pelvis without frank hydronephrosis. The left ureteral stent seen previously is not identified. Cystic area within the lower pole left kidney measures 2.4 x 2.5 x 1.5 cm,  and could reflect a focal dilated calyx or peripelvic cyst. No shadowing gallstones.  Echogenicity within normal limits. Abdominal aorta: No aneurysm visualized. Other findings: Small right pleural effusion is incidentally noted. IMPRESSION: 1. Mild prominence of the left renal pelvis, without frank hydronephrosis. The left ureteral stent seen previously is not identified on this exam. 2. Focally dilated lower pole left renal calyx versus peripelvic cyst, measuring up to 2.5 cm. 3. Gallbladder sludge. No evidence of cholelithiasis or cholecystitis. Electronically Signed   By: Randa Ngo M.D.   On: 02/27/2021 20:23   DG CHEST PORT 1 VIEW  Result Date: 03/01/2021 CLINICAL DATA:  Dyspnea EXAM: PORTABLE CHEST 1 VIEW COMPARISON:  02/27/2021 FINDINGS: Cardiac shadow is stable. Loop recorder is again noted. Patchy airspace opacities are noted bilaterally but continue to improved when compare with the prior exam. No sizable effusion is noted. No bony abnormality is seen. Postsurgical changes in the cervical spine are noted. IMPRESSION: Persistent but improved airspace opacities right greater than left. Electronically Signed   By: Inez Catalina M.D.   On: 03/01/2021 11:13   ECHOCARDIOGRAM COMPLETE  Result Date: 02/28/2021    ECHOCARDIOGRAM REPORT   Patient Name:   Kristopher [name] Date of Exam: 02/28/2021 Medical Rec #:  [id number]    Height:       67.0 in Accession #:    [id number]   Weight:       159.8 lb Date of Birth:  08/07/1950     BSA:          1.838 m Patient Age:    34 years     BP:           119/68 mmHg Patient Gender: M            HR:           81 bpm. Exam Location:  Inpatient Procedure: 2D Echo, Cardiac Doppler, Color Doppler and Intracardiac            Opacification Agent Indications:    Dyspnea R06.00  History:        Patient has prior history of Echocardiogram examinations, most                 recent 09/19/2019. CAD and Previous Myocardial Infarction,                 Arrythmias:Atrial Fibrillation and  Atrial Flutter; Risk                 Factors:Dyslipidemia, Diabetes and Sleep Apnea. 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 Fevers, chills, nausea, vomiting, and abdominal pain.                 Acute Hypoxic Respiratory Failure, pneumonia. Severe sepsis                 secondary to UTI.  Sonographer:    Darlina Sicilian RDCS Referring Phys: [id number] [place]  1. Left ventricular ejection fraction, by estimation, is 50 to 55%. The left ventricle has low normal function. The left ventricle demonstrates regional wall motion abnormalities (see scoring diagram/findings for description). There is mild left ventricular hypertrophy. Left ventricular diastolic parameters are consistent with Grade II  diastolic dysfunction (pseudonormalization). Elevated left ventricular end-diastolic pressure. There is moderate hypokinesis of the left ventricular, basal inferior wall and inferoseptal wall.  2. Right ventricular systolic function is low normal. The right ventricular size is normal.  3. Effusion measures up to 1.17 cm posterior to the LV. a small pericardial effusion is present. The pericardial effusion is posterior to the left ventricle. There is no evidence of cardiac tamponade.  4. The mitral valve is abnormal. Mild mitral valve regurgitation.  5. The aortic valve is tricuspid. Aortic valve regurgitation is not visualized. No aortic stenosis is present.  6. The inferior vena cava is dilated in size with <50% respiratory variability, suggesting right atrial pressure of 15 mmHg. Comparison(s): Changes from prior study are noted. 09/19/2019: LVEF 55-60%, moderate pericardial effusion, no tamponade. FINDINGS  Left Ventricle: Left ventricular ejection fraction, by estimation, is 50 to 55%. The left ventricle has low normal function. The left ventricle  demonstrates regional wall motion abnormalities. Moderate hypokinesis of the left ventricular, basal inferior  wall and inferoseptal wall. Definity contrast agent was given IV to delineate the left ventricular endocardial borders. The left ventricular internal cavity size was normal in size. There is mild left ventricular hypertrophy. Left ventricular diastolic parameters are consistent with Grade II diastolic dysfunction (pseudonormalization). Elevated left ventricular end-diastolic pressure. Right Ventricle: The right ventricular size is normal. No increase in right ventricular wall thickness. Right ventricular systolic function is low normal. Left Atrium: Left atrial size was normal in size. Right Atrium: Right atrial size was normal in size. Pericardium: Effusion measures up to 1.17 cm posterior to the LV. A small pericardial effusion is present. The pericardial effusion is posterior to the left ventricle. There is no evidence of cardiac tamponade. Mitral Valve: The mitral valve is abnormal. There is mild thickening of the mitral valve leaflet(s). There is mild calcification of the mitral valve leaflet(s). Mild mitral valve regurgitation. Tricuspid Valve: The tricuspid valve is grossly normal. Tricuspid valve regurgitation is trivial. Aortic Valve: The aortic valve is tricuspid. Aortic valve regurgitation is not visualized. No aortic stenosis is present. Pulmonic Valve: The pulmonic valve was normal in structure. Pulmonic valve regurgitation is not visualized. Aorta: The aortic root and ascending aorta are structurally normal, with no evidence of dilitation. Venous: The inferior vena cava is dilated in size with less than 50% respiratory variability, suggesting right atrial pressure of 15 mmHg. IAS/Shunts: No atrial level shunt detected by color flow Doppler.  LEFT VENTRICLE PLAX 2D LVIDd:         5.20 cm      Diastology LVIDs:         3.90 cm      LV e' medial:    6.83 cm/s LV PW:         0.90 cm      LV E/e'  medial:  15.2 LV IVS:        0.90 cm      LV e' lateral:   9.02 cm/s LVOT diam:     1.90 cm      LV E/e' lateral: 11.5 LV SV:         32 LV SV Index:   18 LVOT Area:     2.84 cm  LV Volumes (MOD) LV vol d, MOD A2C: 89.0 ml LV vol d, MOD A4C: 166.0 ml LV vol s, MOD A2C: 89.1 ml LV vol s, MOD A4C: 50.5 ml LV SV MOD A2C:     -0.1 ml LV SV MOD A4C:  166.0 ml LV SV MOD BP:      63.3 ml RIGHT VENTRICLE RV S prime:     10.40 cm/s TAPSE (M-mode): 1.4 cm LEFT ATRIUM             Index       RIGHT ATRIUM           Index LA diam:        3.50 cm 1.90 cm/m  RA Area:     11.50 cm LA Vol (A2C):   46.2 ml 25.13 ml/m RA Volume:   23.00 ml  12.51 ml/m LA Vol (A4C):   50.0 ml 27.20 ml/m LA Biplane Vol: 52.2 ml 28.39 ml/m  AORTIC VALVE LVOT Vmax:   83.30 cm/s LVOT Vmean:  41.900 cm/s LVOT VTI:    0.114 m  AORTA Ao Root diam: 3.10 cm Ao Asc diam:  3.00 cm MITRAL VALVE MV Area (PHT): 4.60 cm     SHUNTS MV Decel Time: 165 msec     Systemic VTI:  0.11 m MV E velocity: 104.00 cm/s  Systemic Diam: 1.90 cm MV A velocity: 47.10 cm/s MV E/A ratio:  2.21 Lyman Bishop MD Electronically signed by Lyman Bishop MD Signature Date/Time: 02/28/2021/1:01:42 PM    Final     Cardiac Studies   Echocardiogram 02/28/2021: Impressions: 1. Left ventricular ejection fraction, by estimation, is 50 to 55%. The  left ventricle has low normal function. The left ventricle demonstrates  regional wall motion abnormalities (see scoring diagram/findings for  description). There is mild left  ventricular hypertrophy. Left ventricular diastolic parameters are  consistent with Grade II diastolic dysfunction (pseudonormalization).  Elevated left ventricular end-diastolic pressure. There is moderate  hypokinesis of the left ventricular, basal  inferior wall and inferoseptal wall.   2. Right ventricular systolic function is low normal. The right  ventricular size is normal.   3. Effusion measures up to 1.17 cm posterior to the LV. a small   pericardial effusion is present. The pericardial effusion is posterior to  the left ventricle. There is no evidence of cardiac tamponade.   4. The mitral valve is abnormal. Mild mitral valve regurgitation.   5. The aortic valve is tricuspid. Aortic valve regurgitation is not  visualized. No aortic stenosis is present.   6. The inferior vena cava is dilated in size with <50% respiratory  variability, suggesting right atrial pressure of 15 mmHg.   Comparison(s): Changes from prior study are noted. 09/19/2019: LVEF 55-60%,  moderate pericardial effusion, no tamponade.    Patient Profile     70 y.o. male with a history of CAD with NSTEMI in 06/2013 s/p DES to RCA, ischemic cardiomyopathy with EF as low as 40% in 2013 but improved to 55-60% in 09/2019, paroxysmal atrial fibrillation/flutter s/p PVI ablation x2 in 11/1998 at [facility], pericarditis in 2001 with chronic pericardial effusion, syncope presumed to be vasovagal s/p loop recorder, hyperlipidemia, type 2 diabetes mellitus, obstructive sleep apnea on CPAP, nonalcoholic cirrhosis of liver, who is being seen 02/28/2021 for the evaluation of atrial fibrillation at the request of Dr. Cathlean Sauer.  Assessment & Plan    Paroxysmal Atrial Fibrillation/Flutter - Patient has a long history of atrial fibrillation/flutter. S/p ablation x2 in 2000/2001 at [facility]. No recurrence since then and not on anticoagulation at home. Patient was admitted in sepsis secondary to UTI and has had recurrent paroxysmal atrial fibrillation intermittently throughout admission. Started on IV Cardizem and then IV Amiodarone yesterday and converted back to sinus rhythm. Rates currently in  the 80s. - Potassium 2.9 and Magnesium 1.6. Keep at >4.0  and >2.0, respectively. Being repleted by primary team. - TSH normal. - Echo showed LVEF of 50-55% with hypokinesis of the basal inferior wall and inferoseptal wall. - IV Cardizem stopped this morning. - Continue IV Amiodarone for now until  infection has improved. Will not discharge him on this given underlying liver cirrhosis. - Continue Lopressor 7m twice daily. - CHA2DS2-VASc = 5 (CAD, DM, age, prior infarct noted on MRI). Has not been started on any due to persistent hematuria and anemia. Consider outpatient monitor to evaluate for recurrent atrial fibrillation and to help guide anticoagulation recommendations. [facility]   CAD - S/p DES to RCA in 2015.  - High-sensitivity troponin minimally elevated and flat at 39 >> 38 on 9/7 and then 11 on 9/12. Not consistent with ACS. Consistent with demand in setting of acute illness. - No chest pain. - Has been  maintained on DAPT with Aspirin and Plavix since 2015. Both on hold due to anemia. Can restart when OK with primary team. - Continue beta-blocker and statin.   Acute Hypoxic Respiratory Failure Possible Acute Diastolic CHF - Chest-ray on 02/26/2021 showed bilateral patchy airspace opacity in the lungs (right > left) concerning for bilateral multilobar pneumonia. There was also concern for possible ARDS or aspiration pneumonitis/pneumonia. Repeat chest x-ray on today showed persistent but improved airspace opacities (right > left). - BNP 472. - Has received IV Lasix the last 3 days.  Documented urine output of 7.4L yesterday. Net negative 10.3  L since admission. Weight 172 lbs today, up from 159 on admission). Renal function improving with diuresis. - Echo as above. - Does not appear significantly follow-up overloaded on exam.  - Will discuss whether another dose of IV Lasix is needed with MD. - Otherwise, management per primary team.   History of Pericarditis with Chronic Pericardial Effusion - Moderate effusion noted on last Echo in 09/2019. - Small effusion noted on Echo this admission.   Acute Blood Loss Anemia Secondary to Hematuria - Hemoglobin 6.8 on admission. S/p 2 units of PRBC. Slowly improving and 9.6 today. - Treated with  CBI. - Management per Urology and primary team.   AKI - Creatinine 2.11 on admission. Improved to 1.5 today. Baseline 0.8 to 1.2. - Continue to avoid Nephrotoxic agents.  - Continue to follow closely.  Hypokalemia - Potassium 2.9 today. Goal >4.0 given atrial fibrillation. - Being repleted by primary team.  - Continue to monitor.   Hypomagnesemia - Magnesium 1.6 today Goal >2.0 given atrial fibrillation. - Being repleted by primary team. - Continue to monitor.   Otherwise, per primary team: - Severe sepsis secondary to UTI - New metabolic encephalopathy - Acute gastritis - Dyslipidemia - Type 2 diabetes mellitus   For questions or updates, [facility] contact [facility] Please consult [email or web address] for contact info under        Signed, CDarreld Mclean PA-C  03/01/2021, 11:54 AM

## 2021-03-01 NOTE — Progress Notes (Signed)
K+ 2.9, Mg 1.6 Replaced per protocol

## 2021-03-02 ENCOUNTER — Inpatient Hospital Stay (HOSPITAL_COMMUNITY): Payer: Medicare HMO

## 2021-03-02 DIAGNOSIS — I10 Essential (primary) hypertension: Secondary | ICD-10-CM

## 2021-03-02 DIAGNOSIS — A419 Sepsis, unspecified organism: Secondary | ICD-10-CM | POA: Diagnosis not present

## 2021-03-02 DIAGNOSIS — E7800 Pure hypercholesterolemia, unspecified: Secondary | ICD-10-CM

## 2021-03-02 DIAGNOSIS — I82611 Acute embolism and thrombosis of superficial veins of right upper extremity: Secondary | ICD-10-CM

## 2021-03-02 DIAGNOSIS — I2510 Atherosclerotic heart disease of native coronary artery without angina pectoris: Secondary | ICD-10-CM | POA: Diagnosis not present

## 2021-03-02 DIAGNOSIS — I4891 Unspecified atrial fibrillation: Secondary | ICD-10-CM | POA: Diagnosis not present

## 2021-03-02 DIAGNOSIS — K7469 Other cirrhosis of liver: Secondary | ICD-10-CM

## 2021-03-02 DIAGNOSIS — M79601 Pain in right arm: Secondary | ICD-10-CM

## 2021-03-02 LAB — BASIC METABOLIC PANEL
Anion gap: 13 (ref 5–15)
BUN: 25 mg/dL — ABNORMAL HIGH (ref 8–23)
CO2: 28 mmol/L (ref 22–32)
Calcium: 7.6 mg/dL — ABNORMAL LOW (ref 8.9–10.3)
Chloride: 92 mmol/L — ABNORMAL LOW (ref 98–111)
Creatinine, Ser: 1.12 mg/dL (ref 0.61–1.24)
GFR, Estimated: >60 mL/min (ref >60)
Glucose, Bld: 164 mg/dL — ABNORMAL HIGH (ref 70–99)
Potassium: 2.8 mmol/L — ABNORMAL LOW (ref 3.5–5.1)
Sodium: 133 mmol/L — ABNORMAL LOW (ref 135–145)

## 2021-03-02 LAB — GLUCOSE, CAPILLARY
Glucose-Capillary: 161 mg/dL — ABNORMAL HIGH (ref 70–99)
Glucose-Capillary: 136 mg/dL — ABNORMAL HIGH (ref 70–99)
Glucose-Capillary: 160 mg/dL — ABNORMAL HIGH (ref 70–99)
Glucose-Capillary: 75 mg/dL (ref 70–99)

## 2021-03-02 LAB — CBC
HCT: 28.2 % — ABNORMAL LOW (ref 39.0–52.0)
Hemoglobin: 9.8 g/dL — ABNORMAL LOW (ref 13.0–17.0)
MCH: 30.3 pg (ref 26.0–34.0)
MCHC: 34.8 g/dL (ref 30.0–36.0)
MCV: 87.3 fL (ref 80.0–100.0)
Platelets: 221 10*3/uL (ref 150–400)
RBC: 3.23 MIL/uL — ABNORMAL LOW (ref 4.22–5.81)
RDW: 16.3 % — ABNORMAL HIGH (ref 11.5–15.5)
WBC: 8.6 10*3/uL (ref 4.0–10.5)
nRBC: 0 % (ref 0.0–0.2)

## 2021-03-02 LAB — MAGNESIUM: Magnesium: 1.6 mg/dL — ABNORMAL LOW (ref 1.7–2.4)

## 2021-03-02 LAB — POTASSIUM: Potassium: 3.3 mmol/L — ABNORMAL LOW (ref 3.5–5.1)

## 2021-03-02 MED ORDER — POTASSIUM CHLORIDE CRYS ER 20 MEQ PO TBCR
40.0000 meq | EXTENDED_RELEASE_TABLET | ORAL | Status: AC
  Administered 2021-03-02 (×2): 40 meq via ORAL
  Filled 2021-03-02 (×2): qty 2

## 2021-03-02 MED ORDER — PROCHLORPERAZINE EDISYLATE 10 MG/2ML IJ SOLN
5.0000 mg | Freq: Once | INTRAMUSCULAR | Status: AC
  Administered 2021-03-02: 5 mg via INTRAVENOUS
  Filled 2021-03-02: qty 2

## 2021-03-02 MED ORDER — POTASSIUM CHLORIDE CRYS ER 20 MEQ PO TBCR
40.0000 meq | EXTENDED_RELEASE_TABLET | Freq: Once | ORAL | Status: AC
  Administered 2021-03-02: 40 meq via ORAL
  Filled 2021-03-02: qty 2

## 2021-03-02 MED ORDER — MAGNESIUM SULFATE 4 GM/100ML IV SOLN
4.0000 g | Freq: Once | INTRAVENOUS | Status: AC
  Administered 2021-03-02: 4 g via INTRAVENOUS
  Filled 2021-03-02: qty 100

## 2021-03-02 MED ORDER — MORPHINE SULFATE (PF) 2 MG/ML IV SOLN: 1.0000 mg | INTRAVENOUS | Status: DC | PRN

## 2021-03-02 NOTE — Progress Notes (Signed)
Progress Note  Patient Name: Kristopher [name] Date of Encounter: 03/02/2021  [place] Cardiologist: [facility]  Subjective   No acute overnight events. No cardiac complaints this morning. No chest pain, shortness of breath, or palpitations. Maintaining sinus rhythm. Still has some left flank pain.   Inpatient Medications    Scheduled Meds:  (feeding supplement) PROSource Plus  30 mL Oral BID BM   sodium chloride   Intravenous Once   sodium chloride   Intravenous Once   buPROPion  150 mg Oral Daily   Chlorhexidine Gluconate Cloth  6 each Topical Daily   feeding supplement  237 mL Oral Q24H   gabapentin  100 mg Oral TID   insulin aspart  0-15 Units Subcutaneous TID WC   insulin aspart  0-5 Units Subcutaneous QHS   insulin glargine-yfgn  12 Units Subcutaneous Daily   mouth rinse  15 mL Mouth Rinse BID   metoprolol tartrate  50 mg Oral BID   multivitamins with iron  1 tablet Oral Daily   niacin  500 mg Oral Daily   pantoprazole  40 mg Oral BID   prochlorperazine  5 mg Intravenous Once   rOPINIRole  1 mg Oral QHS   rosuvastatin  5 mg Oral QPM   sucralfate  1 g Oral TID WC & HS   tamsulosin  0.4 mg Oral QPM   thiamine  100 mg Oral Daily   Continuous Infusions:  sodium chloride 10 mL/hr at 03/01/21 1800   sodium chloride Stopped (02/28/21 1156)   amiodarone 30 mg/hr (03/02/21 0618)   meropenem (MERREM) IV Stopped (03/01/21 2302)   promethazine (PHENERGAN) injection (IM or IVPB) Stopped (03/01/21 2109)   sodium chloride irrigation Stopped (02/28/21 1908)   PRN Meds: sodium chloride, sodium chloride, baclofen, diphenhydrAMINE, metoprolol tartrate, morphine injection, nitroGLYCERIN, ondansetron (ZOFRAN) IV, promethazine (PHENERGAN) injection (IM or IVPB), traMADol   Vital Signs    Vitals:   03/02/21 0322 03/02/21 0400 03/02/21 0500 03/02/21 0600  BP:  (!) 146/66 (!) 132/58 (!) 95/45  Pulse:  90 93 91  Resp:  20 (!) 21 16  Temp: 98.1 F (36.7 C)     TempSrc:  Oral     SpO2:  95% 94% 94%  Weight: 77.2 kg     Height:        Intake/Output Summary (Last 24 hours) at 03/02/2021 0722 Last data filed at 03/02/2021 0618 Gross per 24 hour  Intake 945.92 ml  Output 4350 ml  Net -3404.08 ml   Last 3 Weights 03/02/2021 03/01/2021 02/23/2021  Weight (lbs) 170 lb 3.1 oz 172 lb 13.5 oz 159 lb 13.3 oz  Weight (kg) 77.2 kg 78.4 kg 72.5 kg      Telemetry    Normal sinus rhythm with rates in the 80s to 90s. PACs/PVCs noted. - Personally Reviewed  ECG    No new ECG tracing today. - Personally Reviewed  Physical Exam   GEN: No acute distress.   Neck: Supple. No JVD Cardiac: RRR. No murmurs, rubs, or gallops. Respiratory: No increased work of breathing. Mild decreased breath sounds bilaterally but otherwise clear to auscultation. No wheezes, rhonchi, or rales. GI: Soft, non-tender, non-distended. MS: No lower extremity edema. No deformity. Skin: Warm and dry. Neuro:  No focal deficits.  Psych: Normal affect. Responds appropriately.   Labs    High Sensitivity Troponin:   Recent Labs  Lab 02/23/21 1605 02/23/21 1820 02/28/21 0537  TROPONINIHS 39* 38* 11      Chemistry  Recent Labs  Lab 02/23/21 1605 02/24/21 0501 02/25/21 0332 02/27/21 1658 02/28/21 0254 03/01/21 0219 03/02/21 0236  NA 136 136   < >  --  132* 132* 133*  K 4.1 4.3   < >  --  3.3* 2.9* 2.8*  CL 101 101   < >  --  96* 95* 92*  CO2 24 26   < >  --  17* 24 28  GLUCOSE 235* 228*   < >  --  269* 254* 164*  BUN 35* 35*   < >  --  40* 30* 25*  CREATININE 2.11* 1.99*   < >  --  2.03* 1.50* 1.12  CALCIUM 8.1* 8.0*   < >  --  8.1* 7.8* 7.6*  PROT 6.1* 5.9*  --  5.8*  --   --   --   ALBUMIN 3.3* 2.8*  --  2.3*  --   --   --   AST 113* 174*  --  38  --   --   --   ALT 27 47*  --  30  --   --   --   ALKPHOS 65 62  --  66  --   --   --   BILITOT 1.4* 1.4*  --  1.2  --   --   --   GFRNONAA 33* 36*   < >  --  35* 50* >60  ANIONGAP 11 9   < >  --  19* 13 13   < > = values in this  interval not displayed.     Hematology Recent Labs  Lab 02/28/21 0254 03/01/21 0219 03/02/21 0236  WBC 11.5* 9.2 8.6  RBC 3.39* 3.10* 3.23*  HGB 10.4* 9.6* 9.8*  HCT 30.4* 27.2* 28.2*  MCV 89.7 87.7 87.3  MCH 30.7 31.0 30.3  MCHC 34.2 35.3 34.8  RDW 17.0* 16.6* 16.3*  PLT 181 189 221    BNP Recent Labs  Lab 02/28/21 1017  BNP 472.6*     DDimer No results for input(s): DDIMER in the last 168 hours.   Radiology    DG Abd 1 View  Result Date: 02/28/2021 CLINICAL DATA:  Fever and chills EXAM: ABDOMEN - 1 VIEW COMPARISON:  Ultrasound from the previous day FINDINGS: Scattered large and small bowel gas is noted. No abnormal mass or abnormal calcifications are seen. Left ureteral stent is noted in satisfactory position. No acute bony abnormality is noted. IMPRESSION: No acute abnormality noted. Electronically Signed   By: Inez Catalina M.D.   On: 02/28/2021 11:38   DG CHEST PORT 1 VIEW  Result Date: 03/01/2021 CLINICAL DATA:  Dyspnea EXAM: PORTABLE CHEST 1 VIEW COMPARISON:  02/27/2021 FINDINGS: Cardiac shadow is stable. Loop recorder is again noted. Patchy airspace opacities are noted bilaterally but continue to improved when compare with the prior exam. No sizable effusion is noted. No bony abnormality is seen. Postsurgical changes in the cervical spine are noted. IMPRESSION: Persistent but improved airspace opacities right greater than left. Electronically Signed   By: Inez Catalina M.D.   On: 03/01/2021 11:13   ECHOCARDIOGRAM COMPLETE  Result Date: 02/28/2021    ECHOCARDIOGRAM REPORT   Patient Name:   Kristopher [name] Date of Exam: 02/28/2021 Medical Rec #:  [id number]    Height:       67.0 in Accession #:    [id number]   Weight:       159.8 lb Date of Birth:  [birth year]  BSA:          1.838 m Patient Age:    70 years     BP:           119/68 mmHg Patient Gender: M            HR:           81 bpm. Exam Location:  Inpatient Procedure: 2D Echo, Cardiac Doppler, Color Doppler and  Intracardiac            Opacification Agent Indications:    Dyspnea R06.00  History:        Patient has prior history of Echocardiogram examinations, most                 recent 09/19/2019. CAD and Previous Myocardial Infarction,                 Arrythmias:Atrial Fibrillation and Atrial Flutter; Risk                 Factors:Dyslipidemia, Diabetes and Sleep Apnea. 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 Fevers, chills, nausea, vomiting, and abdominal pain.                 Acute Hypoxic Respiratory Failure, pneumonia. Severe sepsis                 secondary to UTI.  Sonographer:    Darlina Sicilian RDCS Referring Phys: [id number] [place]  1. Left ventricular ejection fraction, by estimation, is 50 to 55%. The left ventricle has low normal function. The left ventricle demonstrates regional wall motion abnormalities (see scoring diagram/findings for description). There is mild left ventricular hypertrophy. Left ventricular diastolic parameters are consistent with Grade II diastolic dysfunction (pseudonormalization). Elevated left ventricular end-diastolic pressure. There is moderate hypokinesis of the left ventricular, basal inferior wall and inferoseptal wall.  2. Right ventricular systolic function is low normal. The right ventricular size is normal.  3. Effusion measures up to 1.17 cm posterior to the LV. a small pericardial effusion is present. The pericardial effusion is posterior to the left ventricle. There is no evidence of cardiac tamponade.  4. The mitral valve is abnormal. Mild mitral valve regurgitation.  5. The aortic valve is tricuspid. Aortic valve regurgitation is not visualized. No aortic stenosis is present.  6. The inferior vena cava is dilated in size with <50% respiratory variability, suggesting right atrial pressure of 15 mmHg.  Comparison(s): Changes from prior study are noted. 09/19/2019: LVEF 55-60%, moderate pericardial effusion, no tamponade. FINDINGS  Left Ventricle: Left ventricular ejection fraction, by estimation, is 50 to 55%. The left ventricle has low normal function. The left ventricle demonstrates regional wall motion abnormalities. Moderate hypokinesis of the left ventricular, basal inferior  wall and inferoseptal wall. Definity contrast agent was given IV to delineate the left ventricular endocardial borders. The left ventricular internal cavity size was normal in size. There is mild left ventricular hypertrophy. Left ventricular diastolic parameters are consistent with Grade II diastolic dysfunction (pseudonormalization). Elevated left ventricular end-diastolic pressure. Right Ventricle: The right ventricular size is normal. No increase in right ventricular wall thickness. Right  ventricular systolic function is low normal. Left Atrium: Left atrial size was normal in size. Right Atrium: Right atrial size was normal in size. Pericardium: Effusion measures up to 1.17 cm posterior to the LV. A small pericardial effusion is present. The pericardial effusion is posterior to the left ventricle. There is no evidence of cardiac tamponade. Mitral Valve: The mitral valve is abnormal. There is mild thickening of the mitral valve leaflet(s). There is mild calcification of the mitral valve leaflet(s). Mild mitral valve regurgitation. Tricuspid Valve: The tricuspid valve is grossly normal. Tricuspid valve regurgitation is trivial. Aortic Valve: The aortic valve is tricuspid. Aortic valve regurgitation is not visualized. No aortic stenosis is present. Pulmonic Valve: The pulmonic valve was normal in structure. Pulmonic valve regurgitation is not visualized. Aorta: The aortic root and ascending aorta are structurally normal, with no evidence of dilitation. Venous: The inferior vena cava is dilated in size with less than 50% respiratory  variability, suggesting right atrial pressure of 15 mmHg. IAS/Shunts: No atrial level shunt detected by color flow Doppler.  LEFT VENTRICLE PLAX 2D LVIDd:         5.20 cm      Diastology LVIDs:         3.90 cm      LV e' medial:    6.83 cm/s LV PW:         0.90 cm      LV E/e' medial:  15.2 LV IVS:        0.90 cm      LV e' lateral:   9.02 cm/s LVOT diam:     1.90 cm      LV E/e' lateral: 11.5 LV SV:         32 LV SV Index:   18 LVOT Area:     2.84 cm  LV Volumes (MOD) LV vol d, MOD A2C: 89.0 ml LV vol d, MOD A4C: 166.0 ml LV vol s, MOD A2C: 89.1 ml LV vol s, MOD A4C: 50.5 ml LV SV MOD A2C:     -0.1 ml LV SV MOD A4C:     166.0 ml LV SV MOD BP:      63.3 ml RIGHT VENTRICLE RV S prime:     10.40 cm/s TAPSE (M-mode): 1.4 cm LEFT ATRIUM             Index       RIGHT ATRIUM           Index LA diam:        3.50 cm 1.90 cm/m  RA Area:     11.50 cm LA Vol (A2C):   46.2 ml 25.13 ml/m RA Volume:   23.00 ml  12.51 ml/m LA Vol (A4C):   50.0 ml 27.20 ml/m LA Biplane Vol: 52.2 ml 28.39 ml/m  AORTIC VALVE LVOT Vmax:   83.30 cm/s LVOT Vmean:  41.900 cm/s LVOT VTI:    0.114 m  AORTA Ao Root diam: 3.10 cm Ao Asc diam:  3.00 cm MITRAL VALVE MV Area (PHT): 4.60 cm     SHUNTS MV Decel Time: 165 msec     Systemic VTI:  0.11 m MV E velocity: 104.00 cm/s  Systemic Diam: 1.90 cm MV A velocity: 47.10 cm/s MV E/A ratio:  2.21 Lyman Bishop MD Electronically signed by Lyman Bishop MD Signature Date/Time: 02/28/2021/1:01:42 PM    Final     Cardiac Studies   Echocardiogram 02/28/2021: Impressions: 1. Left ventricular ejection fraction, by estimation, is 50 to 55%.  The  left ventricle has low normal function. The left ventricle demonstrates  regional wall motion abnormalities (see scoring diagram/findings for  description). There is mild left  ventricular hypertrophy. Left ventricular diastolic parameters are  consistent with Grade II diastolic dysfunction (pseudonormalization).  Elevated left ventricular end-diastolic  pressure. There is moderate  hypokinesis of the left ventricular, basal  inferior wall and inferoseptal wall.   2. Right ventricular systolic function is low normal. The right  ventricular size is normal.   3. Effusion measures up to 1.17 cm posterior to the LV. a small  pericardial effusion is present. The pericardial effusion is posterior to  the left ventricle. There is no evidence of cardiac tamponade.   4. The mitral valve is abnormal. Mild mitral valve regurgitation.   5. The aortic valve is tricuspid. Aortic valve regurgitation is not  visualized. No aortic stenosis is present.   6. The inferior vena cava is dilated in size with <50% respiratory  variability, suggesting right atrial pressure of 15 mmHg.   Comparison(s): Changes from prior study are noted. 09/19/2019: LVEF 55-60%,  moderate pericardial effusion, no tamponade.   Patient Profile     70 y.o. male with a history of CAD with NSTEMI in 06/2013 s/p DES to RCA, ischemic cardiomyopathy with EF as low as 40% in 2013 but improved to 55-60% in 09/2019, paroxysmal atrial fibrillation/flutter s/p PVI ablation x2 in 11/1998 at [facility], pericarditis in 2001 with chronic pericardial effusion, syncope presumed to be vasovagal s/p loop recorder, hyperlipidemia, type 2 diabetes mellitus, obstructive sleep apnea on CPAP, nonalcoholic cirrhosis of liver, who is being seen 02/28/2021 for the evaluation of atrial fibrillation at the request of Dr. Cathlean Sauer.    Assessment & Plan    Paroxysmal Atrial Fibrillation/Flutter - Patient has a long history of atrial fibrillation/flutter. S/p ablation x2 in 2000/2001 at [facility]. No recurrence since then and not on anticoagulation at home. Patient was admitted in sepsis secondary to UTI and has had recurrent paroxysmal atrial fibrillation intermittently throughout admission. Started on IV Cardizem and then IV Amiodarone and converted back to sinus rhythm.  - Maintaining sinus rhythm. Rates currently in the 80s. -  Potassium 2.8 and Magnesium 1.6 today. Keep at >4.0  and >2.0, respectively. Being repleted by primary team.  - TSH normal. - Echo showed LVEF of 50-55%. with hypokinesis of the basal inferior wall and inferoseptal wall. - IV Cardizem stopped on 03/01/2021.  - Continue IV Amiodarone while here but will plan to stop prior to discharge. Will not discharge him on this given underlying liver cirrhosis. - Continue Lopressor 24m twice daily. - CHA2DS2-VASc = 5 (CAD, DM, age, prior infarct noted on MRI). Has not been started on any anticoagulation due to hematuria and anemia. Consider outpatient monitor to evaluate for recurrent atrial fibrillation and to help guide anticoagulation recommendations. [facility]  Acute Hypoxic Respiratory Failure Possible Acute Diastolic CHF - Chest-ray on 02/26/2021 showed bilateral patchy airspace opacity in the lungs (right > left) concerning for bilateral multilobar pneumonia. There was also concern for possible ARDS or aspiration pneumonitis/pneumonia. Repeat chest x-ray on today showed persistent but improved airspace opacities (right > left). - BNP 472. - Has received IV Lasix the last 4 days.  Documented urine output of 4.35L yesterday. Net negative 13.2  L since admission. Weight 170 lbs today, down 2 lbs from yesterday but up from admission (do not think this is accurate). Renal function improving with diuresis. - Echo showed  LVEF of 50-55% with hypokinesis of the basal inferior wall and inferoseptal wall. - Does not appear significantly follow-up overloaded on exam.  - Will discuss whether additional diuresis is needed with MD.  - Otherwise, management per primary team.  Elevated Troponin History of CAD - S/p DES to RCA in 2015.  - High-sensitivity troponin minimally elevated and flat at 39 >> 38 on 9/7 and then 11 on 9/12.  - Echo report did not hypokinesis of the basal inferior wall and inferoseptal wall but Dr. Radford Pax  personally reviewed images and felt wall motion was normal.  - No chest pain. - Has been  maintained on DAPT with Aspirin and Plavix since 2015. Both on hold due to anemia. Can restart when OK with primary team. - Continue beta-blocker and statin. - Troponin elevation not consistent with ACS. Consistent with demand in setting of acute illness. No ischemic work-up necessary at this time.   History of Pericarditis with Chronic Pericardial Effusion - Moderate effusion noted on last Echo in 09/2019. - Small effusion noted on Echo this admission.   Acute Blood Loss Anemia Secondary to Hematuria - Hemoglobin 6.8 on admission. S/p 2 units of PRBC. Slowly improving and stable at 9.8 today. - Treated with CBI. - Management per Urology and primary team.   AKI - Creatinine 2.11 on admission. Improved to 1.12 today. Baseline 0.8 to 1.2. - Continue to avoid Nephrotoxic agents.  - Continue to follow closely.   Hypokalemia - Potassium 2.8 today. Goal >4.0 given atrial fibrillation. - Being repleted by primary team.  - Continue to monitor.    Hypomagnesemia - Magnesium 1.6 today Goal >2.0 given atrial fibrillation. - Being repleted by primary team.  - Continue to monitor.   Otherwise, per primary team: - Severe sepsis secondary to UTI - New metabolic encephalopathy - Acute gastritis - Dyslipidemia - Type 2 diabetes mellitus     For questions or updates, please contact [facility] Please consult [email or web address] for contact info under        Signed, Darreld Mclean, PA-C  03/02/2021, 7:22 AM

## 2021-03-02 NOTE — TOC Progression Note (Signed)
Transition of Care [facility]) - Progression Note    Patient Details  Name: Kristopher [name] MRN: [id number] Date of Birth: 05-Mar-1951  [facility] CM/SW Contact  Leeroy Cha, RN Phone Number: 03/02/2021, 7:41 AM  Clinical Narrative:    Continues to be in a.fib on iv amiodarone, iv merrem, will follow for progression, plan is to return to home.   Expected Discharge Plan: Home/Self Care Barriers to Discharge: Continued Medical Work up  Expected Discharge Plan and Services Expected Discharge Plan: Home/Self Care   Discharge Planning Services: CM Consult   Living arrangements for the past 2 months: Single Family Home                                       Social Determinants of Health (SDOH) Interventions    Readmission Risk Interventions No flowsheet data found.

## 2021-03-02 NOTE — Progress Notes (Signed)
Subjective: ECHO completed, review shows no wall motion abnormalities with low normal EF. CXR shows slightly improved airspace WBC and creatinine both normal today Persistent brown urine but no clots off CBI x 48 hours. Feels better today, minimal flank pain.  Objective: Vital signs in last 24 hours: Temp:  [98.1 F (36.7 C)-98.4 F (36.9 C)] 98.4 F (36.9 C) (09/14 0800) Pulse Rate:  [71-98] 93 (09/14 0800) Resp:  [16-24] 17 (09/14 0800) BP: (95-160)/(45-81) 159/81 (09/14 0800) SpO2:  [90 %-100 %] 98 % (09/14 0800) Weight:  [77.2 kg] 77.2 kg (09/14 0322)  Intake/Output from previous day: 09/13 0701 - 09/14 0700 In: 945.9 [P.O.:240; I.V.:454.5; IV Piggyback:251.5] Out: 6503 [Urine:4350; Stool:1] Intake/Output this shift: No intake/output data recorded.  Physical Exam:  General: Alert and oriented Breathing comfortably with nasal cannula Catheter is draining straw colored urine.  Lab Results: Recent Labs    02/28/21 0254 03/01/21 0219 03/02/21 0236  HGB 10.4* 9.6* 9.8*  HCT 30.4* 27.2* 28.2*    BMET Recent Labs    03/01/21 0219 03/02/21 0236  NA 132* 133*  K 2.9* 2.8*  CL 95* 92*  CO2 24 28  GLUCOSE 254* 164*  BUN 30* 25*  CREATININE 1.50* 1.12  CALCIUM 7.8* 7.6*      Studies/Results: DG Abd 1 View  Result Date: 02/28/2021 CLINICAL DATA:  Fever and chills EXAM: ABDOMEN - 1 VIEW COMPARISON:  Ultrasound from the previous day FINDINGS: Scattered large and small bowel gas is noted. No abnormal mass or abnormal calcifications are seen. Left ureteral stent is noted in satisfactory position. No acute bony abnormality is noted. IMPRESSION: No acute abnormality noted. Electronically Signed   By: Inez Catalina M.D.   On: 02/28/2021 11:38   DG CHEST PORT 1 VIEW  Result Date: 03/01/2021 CLINICAL DATA:  Dyspnea EXAM: PORTABLE CHEST 1 VIEW COMPARISON:  02/27/2021 FINDINGS: Cardiac shadow is stable. Loop recorder is again noted. Patchy airspace opacities are noted  bilaterally but continue to improved when compare with the prior exam. No sizable effusion is noted. No bony abnormality is seen. Postsurgical changes in the cervical spine are noted. IMPRESSION: Persistent but improved airspace opacities right greater than left. Electronically Signed   By: Inez Catalina M.D.   On: 03/01/2021 11:13   ECHOCARDIOGRAM COMPLETE  Result Date: 02/28/2021    ECHOCARDIOGRAM REPORT   Patient Name:   JAISHAWN WITZKE Date of Exam: 02/28/2021 Medical Rec #:  [id number]    Height:       67.0 in Accession #:    [id number]   Weight:       159.8 lb Date of Birth:  May 04, 1951     BSA:          1.838 m Patient Age:    70 years     BP:           119/68 mmHg Patient Gender: M            HR:           81 bpm. Exam Location:  Inpatient Procedure: 2D Echo, Cardiac Doppler, Color Doppler and Intracardiac            Opacification Agent Indications:    Dyspnea R06.00  History:        Patient has prior history of Echocardiogram examinations, most                 recent 09/19/2019. CAD and Previous Myocardial Infarction,  Arrythmias:Atrial Fibrillation and Atrial Flutter; Risk                 Factors:Dyslipidemia, Diabetes and Sleep Apnea. 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 Fevers, chills, nausea, vomiting, and abdominal pain.                 Acute Hypoxic Respiratory Failure, pneumonia. Severe sepsis                 secondary to UTI.  Sonographer:    Darlina Sicilian RDCS Referring Phys: [id number] [place]  1. Left ventricular ejection fraction, by estimation, is 50 to 55%. The left ventricle has low normal function. The left ventricle demonstrates regional wall motion abnormalities (see scoring diagram/findings for description). There is mild left ventricular hypertrophy. Left ventricular diastolic parameters are consistent  with Grade II diastolic dysfunction (pseudonormalization). Elevated left ventricular end-diastolic pressure. There is moderate hypokinesis of the left ventricular, basal inferior wall and inferoseptal wall.  2. Right ventricular systolic function is low normal. The right ventricular size is normal.  3. Effusion measures up to 1.17 cm posterior to the LV. a small pericardial effusion is present. The pericardial effusion is posterior to the left ventricle. There is no evidence of cardiac tamponade.  4. The mitral valve is abnormal. Mild mitral valve regurgitation.  5. The aortic valve is tricuspid. Aortic valve regurgitation is not visualized. No aortic stenosis is present.  6. The inferior vena cava is dilated in size with <50% respiratory variability, suggesting right atrial pressure of 15 mmHg. Comparison(s): Changes from prior study are noted. 09/19/2019: LVEF 55-60%, moderate pericardial effusion, no tamponade. FINDINGS  Left Ventricle: Left ventricular ejection fraction, by estimation, is 50 to 55%. The left ventricle has low normal function. The left ventricle demonstrates regional wall motion abnormalities. Moderate hypokinesis of the left ventricular, basal inferior  wall and inferoseptal wall. Definity contrast agent was given IV to delineate the left ventricular endocardial borders. The left ventricular internal cavity size was normal in size. There is mild left ventricular hypertrophy. Left ventricular diastolic parameters are consistent with Grade II diastolic dysfunction (pseudonormalization). Elevated left ventricular end-diastolic pressure. Right Ventricle: The right ventricular size is normal. No increase in right ventricular wall thickness. Right ventricular systolic function is low normal. Left Atrium: Left atrial size was normal in size. Right Atrium: Right atrial size was normal in size. Pericardium: Effusion measures up to 1.17 cm posterior to the LV. A small pericardial effusion is present. The  pericardial effusion is posterior to the left ventricle. There is no evidence of cardiac tamponade. Mitral Valve: The mitral valve is abnormal. There is mild thickening of the mitral valve leaflet(s). There is mild calcification of the mitral valve leaflet(s). Mild mitral valve regurgitation. Tricuspid Valve: The tricuspid valve is grossly normal. Tricuspid valve regurgitation is trivial. Aortic Valve: The aortic valve is tricuspid. Aortic valve regurgitation is not visualized. No aortic stenosis is present. Pulmonic Valve: The pulmonic valve was normal in structure. Pulmonic valve regurgitation is not visualized. Aorta: The aortic root and ascending aorta are structurally normal, with no evidence of dilitation. Venous: The inferior vena  cava is dilated in size with less than 50% respiratory variability, suggesting right atrial pressure of 15 mmHg. IAS/Shunts: No atrial level shunt detected by color flow Doppler.  LEFT VENTRICLE PLAX 2D LVIDd:         5.20 cm      Diastology LVIDs:         3.90 cm      LV e' medial:    6.83 cm/s LV PW:         0.90 cm      LV E/e' medial:  15.2 LV IVS:        0.90 cm      LV e' lateral:   9.02 cm/s LVOT diam:     1.90 cm      LV E/e' lateral: 11.5 LV SV:         32 LV SV Index:   18 LVOT Area:     2.84 cm  LV Volumes (MOD) LV vol d, MOD A2C: 89.0 ml LV vol d, MOD A4C: 166.0 ml LV vol s, MOD A2C: 89.1 ml LV vol s, MOD A4C: 50.5 ml LV SV MOD A2C:     -0.1 ml LV SV MOD A4C:     166.0 ml LV SV MOD BP:      63.3 ml RIGHT VENTRICLE RV S prime:     10.40 cm/s TAPSE (M-mode): 1.4 cm LEFT ATRIUM             Index       RIGHT ATRIUM           Index LA diam:        3.50 cm 1.90 cm/m  RA Area:     11.50 cm LA Vol (A2C):   46.2 ml 25.13 ml/m RA Volume:   23.00 ml  12.51 ml/m LA Vol (A4C):   50.0 ml 27.20 ml/m LA Biplane Vol: 52.2 ml 28.39 ml/m  AORTIC VALVE LVOT Vmax:   83.30 cm/s LVOT Vmean:  41.900 cm/s LVOT VTI:    0.114 m  AORTA Ao Root diam: 3.10 cm Ao Asc diam:  3.00 cm MITRAL VALVE  MV Area (PHT): 4.60 cm     SHUNTS MV Decel Time: 165 msec     Systemic VTI:  0.11 m MV E velocity: 104.00 cm/s  Systemic Diam: 1.90 cm MV A velocity: 47.10 cm/s MV E/A ratio:  2.21 Lyman Bishop MD Electronically signed by Lyman Bishop MD Signature Date/Time: 02/28/2021/1:01:42 PM    Final     Assessment/Plan: 1.  Patient has gross hematuria which initially was from kidney surgery.  That bleeding has stopped.  He does have an organized clot within his bladder which will not be able to irrigate through the Foley catheter.  To remove this he would not need to go to the operating room.  However, over time this will dissolve and come out on its own.   - one more day of bladder rest prior to voiding trial, worried about stretch injury from overdistention Sunday night.  2.  The patient's ultrasound demonstrated some lower pole pelvic caliectasis, but otherwise no hydroureteronephrosis or stones.  He did have some clot within the bladder, this is likely lysing and improving.  The stent was not well visualized, but did appear to be unchanged in position compared paired to the CAT scan that he had upon admission on the [facility].   - kidney function normalizing  3.  The patient is recovering from urosepsis and is on meropenem.  We will continue this per ID. - last day of abx prior to stopping them.  4. Recurrent afib - seems to now be back in NSR with plans to stop amio prior to discharge.  Hopeful this is all stress induced from sepsis and things continue to trend positive for him.  Apppreciate Cardiology's help.  If anticoagulation is thought to be necessary, I would like to get his ureteral stent out before that is started, which would be next week, but will defer to cardiology to start if they feel it should be started sooner.  Has floor orders, best guess would be discharge on Friday.  Hopefully he'll be discharged without a foley and off abx.    LOS: 7 days   [name] 03/02/2021, 8:43 AM

## 2021-03-02 NOTE — Progress Notes (Signed)
PROGRESS NOTE    Kristopher [name]  QQI:[id number] DOB: [birth year] DOA: 02/23/2021 PCP: Townsend Roger, MD   Chief Complaint  Patient presents with   Weakness   Nausea    Brief Narrative:   [name] was admitted to the hospital with the working diagnosis of severe sepsis due to urinary tract infection, Klebsiella bacteremia, ESBL, Hospitalization complicated with atrial fibrillation with RVR, acute heart failure and ARDS with acute hypoxemic respiratory failure and acute metabolic encephalopathy.    70 year old male past medical history for atrial fibrillation, coronary artery disease, nonalcoholic cirrhosis, dyslipidemia, type 2 diabetes mellitus, and urolithiasis who presented with fevers, chills and hematuria. 8/26 patient underwent cystoscopy, left retrograde pyelogram with interpretation, left percutaneous renal access, left nephrolithotomy, left nephrostogram, left ureteral stent. Postprocedure he had a Foley catheter placed for urinary retention.   On 9/3 patient returned to the ED because urinary retention.  Apparently his Foley catheter was blocked by blood clot at home and he removed it himself.  Foley catheter was replaced and the patient was discharged home.   Patient continued to have fevers, chills along with back pain at home that prompted him to come back to the hospital.  On his initial physical examination his temperature was 103 F, blood pressure 87/59, heart rate 155, respiratory 28, oxygen saturation 72% on room air.  He was awake and alert, dry mucous membranes, lungs clear to auscultation bilaterally, heart S1-S2, tachycardic, abdomen soft nontender.   Sodium 136, potassium 4.1, chloride 101, bicarb 24, glucose 235, BUN 35, creatinine 2.1, AST 113, ALT 27, high sensitive troponin 39, lactic acid 1.2, white count 9.0, hemoglobin 6.8, hematocrit 20.1, platelets 125. SARS COVID-19 negative.   Urinalysis > 50 white cells, > 50 red cells.  Brown color, turbid.   CT of the  abdomen pelvis left double-J ureteral stent in place.  No hydroureter.  Several mildly dilated loops of small bowel in the upper quadrant, no obvious transition to indicate obstruction.   Chest radiograph no infiltrates.   EKG 107 bpm, normal axis, normal intervals, sinus rhythm, no significant ST segment changes, negative T waves V4-V6.   Patient was placed on antibiotic therapy and IV fluids.   Culture positive for multidrug resistant Klebsiella.   09/09 patient developed atrial fibrillation with RVR, placed on diltiazem infusion, converting to sinus rhythm.  PRBC transfusion for acute blood loss anemia due to hematuria.    Patient transferred to step down unit on 09/10, because difficult to control atrial fibrillation with RVR along with altered mental status. CVA work up with heat CT and brain MRI negative.    09/11 difficult to control atrial fibrillation, positive signs of hypoperfusion. Patient started ton amiodarone infusion converting to sinus rhythm.     Assessment & Plan:   Principal Problem:   Severe Sepsis secondary to UTI Kristopher [name]) Active Problems:   Coronary artery disease involving native coronary artery of native heart without angina pectoris   Atrial fibrillation s/p PVI ablation at [facility] 2001   Obstructive sleep apnea syndrome   Hepatic cirrhosis (HCC)   Type 2 diabetes mellitus without complications ([place])   AKI (acute kidney injury) ([place])   Anemia   Thrombocytopenia ([place])   Sepsis with acute renal failure without septic shock (HCC)   Hematuria   Dyspnea   Atrial fibrillation with RVR (HCC)   ESBL (extended spectrum beta-lactamase) producing bacteria infection   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  Currently afebrile.  Normalization of  WBCs. -Hemodynamic improvement. -Continue IV Merrem as recommended per ID to complete a 7-day course of treatment. -ID following and appreciate input and recommendations.  2.  Right forearm pain/swelling -Patient and wife with concerns for right forearm infection. -Wife concerned about a MRSA infection. -We will get plain films of the forearm to rule out fluid collection. -??  Addition of doxycycline however will defer to ID.  3.  Acute hypoxemic respiratory failure secondary to ARDS and acute diastolic heart failure/paroxysmal atrial fibrillation (status post ablation in the past) -Patient with clinical improvement currently on 1 L nasal cannula with sats of 95%. -On amiodarone drip currently normal sinus rhythm with some occasional PVCs. -A. fib likely secondary to sepsis and acute issues. -2D echo with EF of 50 to 55%, moderate hypokinesis of left ventricular basal inferior wall and inferior septal wall, small pericardial effusion. -Patient status post IV Lasix with urine output of 4.350 L over the past 24 hours. -Patient is currently -12.871 L during this hospitalization. -Was on IV Lasix which are currently on hold. - patient converted to normal sinus rhythm.  Patient -Currently on IV amiodarone per cardiology recommendations to continue until acute issues have improved and subsequently discontinued. -Diltiazem drip has been weaned off. -Continue oral Lopressor. -Anticoagulation on hold due to recent bleeding. -Cardiology following and appreciate input and recommendations.  4.  Acute blood loss anemia secondary to hematuria/thrombocytopenia -Status post transfusion 3 units packed red blood cells. -Hemoglobin stable at 9.8. -Hematuria slowly clearing up. -Follow H&H. -Transfusion threshold hemoglobin < 7.  5.  Acute kidney injury/hyponatremia/hypokalemia/hypomagnesemia/anion gap metabolic acidosis -Patient on diuretics likely leading to electrolyte abnormalities. -Renal  function improving daily. -Patient with a urine output of 4.350 L over the past 24 hours. -Potassium at 2.8 will replete with K-Dur 40 mEq p.o. every 4 hours x3 doses to keep potassium approximately at 4. -Magnesium at 1.6, will give magnesium sulfate 4 g IV x1. -Repeat labs in the morning.  6.  Acute metabolic encephalopathy -Likely secondary to acute illness in the setting of acute kidney injury. -CT head, MRI brain negative for acute abnormalities. -Improving clinically. -Continue current regimen of tramadol and baclofen for back pain control and bupropion for depression. -Neurontin, Wellbutrin doses adjusted due to AKI in the setting of metabolic encephalopathy. -Supportive care.  7.  Diabetes mellitus type 2/dyslipidemia -Hemoglobin A1c 7.4 -CBG 161 this morning. -Continue current dose of Semglee 12 units daily, SSI. -Continue statin.  8.  Acute gastritis -PPI, sucralfate.  9.  History of pericarditis with chronic pericardial effusion -Moderate effusion noted on echo 09/2019. -Small effusion noted on echo during this admission. -Per cardiology.  10.  Elevated troponin/history of CAD -Status post DES to RCA in 2015 -2D echo with EF of 50 to 55%, moderate hypokinesis of left ventricular basal inferior wall and inferior septal wall, small pericardial effusion. -Patient noted to be on DAPT with aspirin and Plavix since 2015 currently on hold due to hematuria. -Urology to advise when DAPT with aspirin and Plavix may be resumed. -Continue beta-blocker, statin. -Per cardiology troponin elevation likely secondary to demand ischemia. -No further cardiac work-up needed at this time per cardiology.  -   DVT prophylaxis: SCDs Code Status: Full Family Communication: Updated patient and wife at bedside. Disposition:   Status is: Inpatient  Remains inpatient appropriate because:IV treatments appropriate due to  intensity of illness or inability to take PO  Dispo: The patient is  from: Home              Anticipated d/c is to: TBD              Patient currently is not medically stable to d/c.   Difficult to place patient No       Consultants:  ID: Dr. Tommy Medal 02/28/2021 Cardiology: Dr. Radford Pax 02/28/2021 PCCM: Dr. Chase Caller 02/27/2021 Telemetry neurology: Dr.Bhagat 02/26/2021 Urology: Dr. Louis Meckel 02/24/2021   Procedures:  CT head 02/23/2021, 02/26/2021 CT abdomen and pelvis 02/23/2021 Chest x-ray 03/01/2021, 02/27/2021, 02/26/2021, 02/23/2021 MRI brain 02/26/2021 Abdominal ultrasound 02/27/2021 2D echo 02/28/2021 Abdominal films 02/28/2022 Transfusion 3 units packed red blood cells  Antimicrobials:  IV cefepime 02/23/2021>>>>> 02/24/2021 IV Merrem 02/24/2021>>>>>> 03/03/2021   Subjective: Laying in bed.  Denies any chest pain.  No shortness of breath.  No abdominal pain.  Patient with complaints of right forearm pain which wife states have is gotten more swollen and painful and she is concerned about MRSA infection.  Foley bag with pinkish/bloody urine.  Currently normal sinus rhythm.  On amiodarone drip.  Objective: Vitals:   03/02/21 0500 03/02/21 0600 03/02/21 0700 03/02/21 0800  BP: (!) 132/58 (!) 95/45 (!) 160/77 (!) 159/81  Pulse: 93 91 92 93  Resp: (!) 21 16 (!) 22 17  Temp:    98.4 F (36.9 C)  TempSrc:    Oral  SpO2: 94% 94% 97% 98%  Weight:      Height:        Intake/Output Summary (Last 24 hours) at 03/02/2021 1005 Last data filed at 03/02/2021 0700 Gross per 24 hour  Intake 841.4 ml  Output 4351 ml  Net -3509.6 ml   Filed Weights   02/23/21 1541 03/01/21 0530 03/02/21 0322  Weight: 72.5 kg 78.4 kg 77.2 kg    Examination:  General exam: Appears calm and comfortable  Respiratory system: Clear to auscultation. Respiratory effort normal. Cardiovascular system: S1 & S2 heard, RRR. No JVD, murmurs, rubs, gallops or clicks. No pedal edema. Gastrointestinal system: Abdomen is nondistended, soft and nontender. No organomegaly or masses felt. Normal bowel  sounds heard. Central nervous system: Alert and oriented. No focal neurological deficits. Extremities: Right forearm with some swelling, significant tenderness to palpation, minimal erythema, small blister noted.   Skin: No rashes, lesions or ulcers Psychiatry: Judgement and insight appear normal. Mood & affect appropriate.     Data Reviewed: I have personally reviewed following labs and imaging studies  CBC: Recent Labs  Lab 02/23/21 1605 02/24/21 0501 02/25/21 0332 02/25/21 1718 02/26/21 0933 02/27/21 0242 02/28/21 0254 03/01/21 0219 03/02/21 0236  WBC 9.0   < > 4.2   < > 8.4 8.2 11.5* 9.2 8.6  NEUTROABS 8.4*  --  3.8  --   --  7.0 10.0* 7.8*  --   HGB 6.8*   < > 7.6*   < > 9.4* 9.0* 10.4* 9.6* 9.8*  HCT 20.1*   < > 22.2*   < > 27.6* 26.4* 30.4* 27.2* 28.2*  MCV 95.3   < > 91.0   < > 89.3 90.1 89.7 87.7 87.3  PLT 125*   < > 93*   < > 139* 121* 181 189 221   < > = values in this interval not displayed.    Basic Metabolic Panel: Recent Labs  Lab 02/26/21 0933 02/27/21 0242 02/27/21 1435 02/28/21 0254 03/01/21 0219 03/02/21 0236  NA 132*  130*  --  132* 132* 133*  K 3.4* 3.2*  --  3.3* 2.9* 2.8*  CL 102 97*  --  96* 95* 92*  CO2 16* 17*  --  17* 24 28  GLUCOSE 203* 231*  --  269* 254* 164*  BUN 40* 40*  --  40* 30* 25*  CREATININE 1.93* 2.16*  --  2.03* 1.50* 1.12  CALCIUM 7.6* 7.4*  --  8.1* 7.8* 7.6*  MG  --   --  1.9 1.8 1.6* 1.6*  PHOS  --  2.2*  --   --   --   --     GFR: Estimated Creatinine Clearance: 59 mL/min (by C-G formula based on SCr of 1.12 mg/dL).  Liver Function Tests: Recent Labs  Lab 02/23/21 1605 02/24/21 0501 02/27/21 1658  AST 113* 174* 38  ALT 27 47* 30  ALKPHOS 65 62 66  BILITOT 1.4* 1.4* 1.2  PROT 6.1* 5.9* 5.8*  ALBUMIN 3.3* 2.8* 2.3*    CBG: Recent Labs  Lab 03/01/21 0736 03/01/21 1158 03/01/21 1654 03/01/21 2130 03/02/21 0726  GLUCAP 226* 179* 202* 138* 161*     Recent Results (from the past 240 hour(s))  Blood  Culture (routine x 2)     Status: Abnormal   Collection Time: 02/23/21  4:05 PM   Specimen: BLOOD RIGHT FOREARM  Result Value Ref Range Status   Specimen Description   Final    BLOOD RIGHT FOREARM Performed at [name], [place] [address], [place], [place] [address]    Special Requests   Final    BOTTLES DRAWN AEROBIC AND ANAEROBIC Blood Culture adequate volume Performed at [facility] [address], [place], [place] [address]    Culture  Setup Time   Final    GRAM NEGATIVE RODS IN BOTH AEROBIC AND ANAEROBIC BOTTLES CRITICAL RESULT CALLED TO, READ BACK BY AND VERIFIED WITH: PHARMD M BELL [id number] AT 907 AM BY CM Performed at [facility], [place] [address] [place], [place] [address]    Culture (A)  Final    KLEBSIELLA PNEUMONIAE Confirmed Extended Spectrum Beta-Lactamase Producer (ESBL).  In bloodstream infections from ESBL organisms, carbapenems are preferred over piperacillin/tazobactam. They are shown to have a lower risk of mortality.    Report Status 02/26/2021 FINAL  Final   Organism ID, Bacteria KLEBSIELLA PNEUMONIAE  Final      Susceptibility   Klebsiella pneumoniae - MIC*    AMPICILLIN >=32 RESISTANT Resistant     CEFAZOLIN >=64 RESISTANT Resistant     CEFEPIME 2 SENSITIVE Sensitive     CEFTAZIDIME RESISTANT Resistant     CEFTRIAXONE >=64 RESISTANT Resistant     CIPROFLOXACIN 2 INTERMEDIATE Intermediate     GENTAMICIN >=16 RESISTANT Resistant     IMIPENEM <=0.25 SENSITIVE Sensitive     TRIMETH/SULFA >=320 RESISTANT Resistant     AMPICILLIN/SULBACTAM >=32 RESISTANT Resistant     PIP/TAZO 16 SENSITIVE Sensitive     * KLEBSIELLA PNEUMONIAE  Blood Culture ID Panel (Reflexed)     Status: Abnormal (Preliminary result)   Collection Time: 02/23/21  4:05 PM  Result Value Ref Range Status   Enterococcus faecalis NOT DETECTED NOT DETECTED Final   Enterococcus Faecium NOT DETECTED NOT DETECTED Final   Listeria monocytogenes NOT  DETECTED NOT DETECTED Final   Staphylococcus species NOT DETECTED NOT DETECTED Final   Staphylococcus aureus (BCID) NOT DETECTED NOT DETECTED Final   Staphylococcus epidermidis NOT DETECTED NOT DETECTED Final  Staphylococcus lugdunensis NOT DETECTED NOT DETECTED Final   Streptococcus species NOT DETECTED NOT DETECTED Final   Streptococcus agalactiae NOT DETECTED NOT DETECTED Final   Streptococcus pneumoniae NOT DETECTED NOT DETECTED Final   Streptococcus pyogenes NOT DETECTED NOT DETECTED Final   A.calcoaceticus-baumannii NOT DETECTED NOT DETECTED Final   Bacteroides fragilis NOT DETECTED NOT DETECTED Final   Enterobacterales PENDING NOT DETECTED Incomplete   Enterobacter cloacae complex NOT DETECTED NOT DETECTED Final   Escherichia coli NOT DETECTED NOT DETECTED Final   Klebsiella aerogenes NOT DETECTED NOT DETECTED Final   Klebsiella oxytoca NOT DETECTED NOT DETECTED Final   Klebsiella pneumoniae DETECTED (A) NOT DETECTED Final    Comment: CRITICAL RESULT CALLED TO, READ BACK BY AND VERIFIED WITH: PHARMD M BELL [id number] AT 907 AM BY CM    Proteus species NOT DETECTED NOT DETECTED Final   Salmonella species NOT DETECTED NOT DETECTED Final   Serratia marcescens NOT DETECTED NOT DETECTED Final   Haemophilus influenzae NOT DETECTED NOT DETECTED Final   Neisseria meningitidis NOT DETECTED NOT DETECTED Final   Pseudomonas aeruginosa NOT DETECTED NOT DETECTED Final   Stenotrophomonas maltophilia NOT DETECTED NOT DETECTED Final   Candida albicans NOT DETECTED NOT DETECTED Final   Candida auris NOT DETECTED NOT DETECTED Final   Candida glabrata NOT DETECTED NOT DETECTED Final   Candida krusei NOT DETECTED NOT DETECTED Final   Candida parapsilosis NOT DETECTED NOT DETECTED Final   Candida tropicalis NOT DETECTED NOT DETECTED Final   Cryptococcus neoformans/gattii NOT DETECTED NOT DETECTED Final   CTX-M ESBL DETECTED (A) NOT DETECTED Final    Comment: CRITICAL RESULT CALLED TO, READ BACK BY  AND VERIFIED WITH: PHARMD M BELL [id number] AT 907 AM BY CM (NOTE) Extended spectrum beta-lactamase detected. Recommend a carbapenem as initial therapy.      Carbapenem resistance IMP NOT DETECTED NOT DETECTED Final   Carbapenem resistance KPC NOT DETECTED NOT DETECTED Final   Carbapenem resistance NDM NOT DETECTED NOT DETECTED Final   Carbapenem resist OXA 48 LIKE NOT DETECTED NOT DETECTED Final   Carbapenem resistance VIM NOT DETECTED NOT DETECTED Final    Comment: Performed at [facility], [place] [address], [place], [place] [address]  Blood Culture (routine x 2)     Status: Abnormal   Collection Time: 02/23/21  4:24 PM   Specimen: BLOOD  Result Value Ref Range Status   Specimen Description   Final    BLOOD LEFT ANTECUBITAL Performed at [place] [address] [place], [place] [address]    Special Requests   Final    BOTTLES DRAWN AEROBIC AND ANAEROBIC Blood Culture adequate volume Performed at [place] [address], [place], [place] [address]    Culture  Setup Time   Final    GRAM NEGATIVE RODS AEROBIC BOTTLE ONLY CRITICAL VALUE NOTED.  VALUE IS CONSISTENT WITH PREVIOUSLY REPORTED AND CALLED VALUE.    Culture (A)  Final    KLEBSIELLA PNEUMONIAE SUSCEPTIBILITIES PERFORMED ON PREVIOUS CULTURE WITHIN THE LAST 5 DAYS. Performed at [facility], [place] [address], [place], [place] [address]    Report Status 02/26/2021 FINAL  Final  Resp Panel by RT-PCR (Flu A&B, Covid) Nasopharyngeal Swab     Status: None   Collection Time: 02/23/21  4:44 PM   Specimen: Nasopharyngeal Swab; Nasopharyngeal(NP) swabs in vial transport medium  Result Value Ref Range Status   SARS Coronavirus 2 by RT PCR NEGATIVE NEGATIVE Final    Comment: (  NOTE) SARS-CoV-2 target nucleic acids are NOT DETECTED.  The SARS-CoV-2 RNA is generally detectable in upper respiratory specimens during the acute phase of infection. The lowest concentration of  SARS-CoV-2 viral copies this assay can detect is 138 copies/mL. A negative result does not preclude SARS-Cov-2 infection and should not be used as the sole basis for treatment or other patient management decisions. A negative result may occur with  improper specimen collection/handling, submission of specimen other than nasopharyngeal swab, presence of viral mutation(s) within the areas targeted by this assay, and inadequate number of viral copies(<138 copies/mL). A negative result must be combined with clinical observations, patient history, and epidemiological information. The expected result is Negative.  Fact Sheet for Patients:  EntrepreneurPulse.com.au  Fact Sheet for Healthcare Providers:  IncredibleEmployment.be  This test is no t yet approved or cleared by the [place] FDA and  has been authorized for detection and/or diagnosis of SARS-CoV-2 by FDA under an Emergency Use Authorization (EUA). This EUA will remain  in effect (meaning this test can be used) for the duration of the COVID-19 declaration under Section 564(b)(1) of the Act, 21 U.S.C.section 360bbb-3(b)(1), unless the authorization is terminated  or revoked sooner.       Influenza A by PCR NEGATIVE NEGATIVE Final   Influenza B by PCR NEGATIVE NEGATIVE Final    Comment: (NOTE) The Xpert Xpress SARS-CoV-2/FLU/RSV plus assay is intended as an aid in the diagnosis of influenza from Nasopharyngeal swab specimens and should not be used as a sole basis for treatment. Nasal washings and aspirates are unacceptable for Xpert Xpress SARS-CoV-2/FLU/RSV testing.  Fact Sheet for Patients: EntrepreneurPulse.com.au  Fact Sheet for Healthcare Providers: IncredibleEmployment.be  This test is not yet approved or cleared by the [place] FDA and has been authorized for detection and/or diagnosis of SARS-CoV-2 by FDA under an Emergency Use  Authorization (EUA). This EUA will remain in effect (meaning this test can be used) for the duration of the COVID-19 declaration under Section 564(b)(1) of the Act, 21 U.S.C. section 360bbb-3(b)(1), unless the authorization is terminated or revoked.  Performed at [name], [place] [address] [name], [place] [address]   Urine Culture     Status: Abnormal   Collection Time: 02/23/21  4:54 PM   Specimen: In/Out Cath Urine  Result Value Ref Range Status   Specimen Description   Final    IN/OUT CATH URINE Performed at [facility] [place] [address] [place], [place] [address]    Special Requests   Final    NONE Performed at [facility], [place] [address], [place],  [address]    Culture (A)  Final    20,000 COLONIES/mL KLEBSIELLA PNEUMONIAE Confirmed Extended Spectrum Beta-Lactamase Producer (ESBL).  In bloodstream infections from ESBL organisms, carbapenems are preferred over piperacillin/tazobactam. They are shown to have a lower risk of mortality. 20,000 COLONIES/mL ENTEROCOCCUS FAECALIS    Report Status 02/26/2021 FINAL  Final   Organism ID, Bacteria KLEBSIELLA PNEUMONIAE (A)  Final   Organism ID, Bacteria ENTEROCOCCUS FAECALIS (A)  Final      Susceptibility   Enterococcus faecalis - MIC*    AMPICILLIN <=2 SENSITIVE Sensitive     NITROFURANTOIN <=16 SENSITIVE Sensitive     VANCOMYCIN 2 SENSITIVE Sensitive     * 20,000 COLONIES/mL ENTEROCOCCUS FAECALIS   Klebsiella pneumoniae - MIC*    AMPICILLIN >=32 RESISTANT Resistant     CEFAZOLIN >=64 RESISTANT Resistant     CEFEPIME >=32 RESISTANT Resistant  CEFTRIAXONE >=64 RESISTANT Resistant     CIPROFLOXACIN 2 INTERMEDIATE Intermediate     GENTAMICIN >=16 RESISTANT Resistant     IMIPENEM <=0.25 SENSITIVE Sensitive     NITROFURANTOIN 128 RESISTANT Resistant     TRIMETH/SULFA >=320 RESISTANT Resistant     AMPICILLIN/SULBACTAM >=32 RESISTANT Resistant     PIP/TAZO 16  SENSITIVE Sensitive     * 20,000 COLONIES/mL KLEBSIELLA PNEUMONIAE  MRSA Next Gen by PCR, Nasal     Status: None   Collection Time: 02/26/21 10:00 PM   Specimen: Nasal Mucosa; Nasal Swab  Result Value Ref Range Status   MRSA by PCR Next Gen NOT DETECTED NOT DETECTED Final    Comment: (NOTE) The GeneXpert MRSA Assay (FDA approved for NASAL specimens only), is one component of a comprehensive MRSA colonization surveillance program. It is not intended to diagnose MRSA infection nor to guide or monitor treatment for MRSA infections. Test performance is not FDA approved in patients less than 34 years old. Performed at [name], [place] [address] [place], [place] [address]          Radiology Studies: DG CHEST PORT 1 VIEW  Result Date: 03/01/2021 CLINICAL DATA:  Dyspnea EXAM: PORTABLE CHEST 1 VIEW COMPARISON:  02/27/2021 FINDINGS: Cardiac shadow is stable. Loop recorder is again noted. Patchy airspace opacities are noted bilaterally but continue to improved when compare with the prior exam. No sizable effusion is noted. No bony abnormality is seen. Postsurgical changes in the cervical spine are noted. IMPRESSION: Persistent but improved airspace opacities right greater than left. Electronically Signed   By: Inez Catalina M.D.   On: 03/01/2021 11:13   ECHOCARDIOGRAM COMPLETE  Result Date: 02/28/2021    ECHOCARDIOGRAM REPORT   Patient Name:   JAVORIS STAR Date of Exam: 02/28/2021 Medical Rec #:  [id number]    Height:       67.0 in Accession #:    [id number]   Weight:       159.8 lb Date of Birth:  01-26-1951     BSA:          1.838 m Patient Age:    6 years     BP:           119/68 mmHg Patient Gender: M            HR:           81 bpm. Exam Location:  Inpatient Procedure: 2D Echo, Cardiac Doppler, Color Doppler and Intracardiac            Opacification Agent Indications:    Dyspnea R06.00  History:        Patient has prior history of Echocardiogram examinations, most                  recent 09/19/2019. CAD and Previous Myocardial Infarction,                 Arrythmias:Atrial Fibrillation and Atrial Flutter; Risk                 Factors:Dyslipidemia, Diabetes and Sleep Apnea. 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  Fevers, chills, nausea, vomiting, and abdominal pain.                 Acute Hypoxic Respiratory Failure, pneumonia. Severe sepsis                 secondary to UTI.  Sonographer:    Darlina Sicilian RDCS Referring Phys: [id number] [place]  1. Left ventricular ejection fraction, by estimation, is 50 to 55%. The left ventricle has low normal function. The left ventricle demonstrates regional wall motion abnormalities (see scoring diagram/findings for description). There is mild left ventricular hypertrophy. Left ventricular diastolic parameters are consistent with Grade II diastolic dysfunction (pseudonormalization). Elevated left ventricular end-diastolic pressure. There is moderate hypokinesis of the left ventricular, basal inferior wall and inferoseptal wall.  2. Right ventricular systolic function is low normal. The right ventricular size is normal.  3. Effusion measures up to 1.17 cm posterior to the LV. a small pericardial effusion is present. The pericardial effusion is posterior to the left ventricle. There is no evidence of cardiac tamponade.  4. The mitral valve is abnormal. Mild mitral valve regurgitation.  5. The aortic valve is tricuspid. Aortic valve regurgitation is not visualized. No aortic stenosis is present.  6. The inferior vena cava is dilated in size with <50% respiratory variability, suggesting right atrial pressure of 15 mmHg. Comparison(s): Changes from prior study are noted. 09/19/2019: LVEF 55-60%, moderate pericardial effusion, no tamponade. FINDINGS  Left Ventricle: Left ventricular  ejection fraction, by estimation, is 50 to 55%. The left ventricle has low normal function. The left ventricle demonstrates regional wall motion abnormalities. Moderate hypokinesis of the left ventricular, basal inferior  wall and inferoseptal wall. Definity contrast agent was given IV to delineate the left ventricular endocardial borders. The left ventricular internal cavity size was normal in size. There is mild left ventricular hypertrophy. Left ventricular diastolic parameters are consistent with Grade II diastolic dysfunction (pseudonormalization). Elevated left ventricular end-diastolic pressure. Right Ventricle: The right ventricular size is normal. No increase in right ventricular wall thickness. Right ventricular systolic function is low normal. Left Atrium: Left atrial size was normal in size. Right Atrium: Right atrial size was normal in size. Pericardium: Effusion measures up to 1.17 cm posterior to the LV. A small pericardial effusion is present. The pericardial effusion is posterior to the left ventricle. There is no evidence of cardiac tamponade. Mitral Valve: The mitral valve is abnormal. There is mild thickening of the mitral valve leaflet(s). There is mild calcification of the mitral valve leaflet(s). Mild mitral valve regurgitation. Tricuspid Valve: The tricuspid valve is grossly normal. Tricuspid valve regurgitation is trivial. Aortic Valve: The aortic valve is tricuspid. Aortic valve regurgitation is not visualized. No aortic stenosis is present. Pulmonic Valve: The pulmonic valve was normal in structure. Pulmonic valve regurgitation is not visualized. Aorta: The aortic root and ascending aorta are structurally normal, with no evidence of dilitation. Venous: The inferior vena cava is dilated in size with less than 50% respiratory variability, suggesting right atrial pressure of 15 mmHg. IAS/Shunts: No atrial level shunt detected by color flow Doppler.  LEFT VENTRICLE PLAX 2D LVIDd:         5.20  cm      Diastology LVIDs:         3.90 cm      LV e' medial:    6.83 cm/s LV PW:         0.90 cm      LV E/e' medial:  15.2  LV IVS:        0.90 cm      LV e' lateral:   9.02 cm/s LVOT diam:     1.90 cm      LV E/e' lateral: 11.5 LV SV:         32 LV SV Index:   18 LVOT Area:     2.84 cm  LV Volumes (MOD) LV vol d, MOD A2C: 89.0 ml LV vol d, MOD A4C: 166.0 ml LV vol s, MOD A2C: 89.1 ml LV vol s, MOD A4C: 50.5 ml LV SV MOD A2C:     -0.1 ml LV SV MOD A4C:     166.0 ml LV SV MOD BP:      63.3 ml RIGHT VENTRICLE RV S prime:     10.40 cm/s TAPSE (M-mode): 1.4 cm LEFT ATRIUM             Index       RIGHT ATRIUM           Index LA diam:        3.50 cm 1.90 cm/m  RA Area:     11.50 cm LA Vol (A2C):   46.2 ml 25.13 ml/m RA Volume:   23.00 ml  12.51 ml/m LA Vol (A4C):   50.0 ml 27.20 ml/m LA Biplane Vol: 52.2 ml 28.39 ml/m  AORTIC VALVE LVOT Vmax:   83.30 cm/s LVOT Vmean:  41.900 cm/s LVOT VTI:    0.114 m  AORTA Ao Root diam: 3.10 cm Ao Asc diam:  3.00 cm MITRAL VALVE MV Area (PHT): 4.60 cm     SHUNTS MV Decel Time: 165 msec     Systemic VTI:  0.11 m MV E velocity: 104.00 cm/s  Systemic Diam: 1.90 cm MV A velocity: 47.10 cm/s MV E/A ratio:  2.21 Lyman Bishop MD Electronically signed by Lyman Bishop MD Signature Date/Time: 02/28/2021/1:01:42 PM    Final         Scheduled Meds:  (feeding supplement) PROSource Plus  30 mL Oral BID BM   sodium chloride   Intravenous Once   sodium chloride   Intravenous Once   buPROPion  150 mg Oral Daily   Chlorhexidine Gluconate Cloth  6 each Topical Daily   feeding supplement  237 mL Oral Q24H   gabapentin  100 mg Oral TID   insulin aspart  0-15 Units Subcutaneous TID WC   insulin aspart  0-5 Units Subcutaneous QHS   insulin glargine-yfgn  12 Units Subcutaneous Daily   mouth rinse  15 mL Mouth Rinse BID   metoprolol tartrate  50 mg Oral BID   multivitamins with iron  1 tablet Oral Daily   niacin  500 mg Oral Daily   pantoprazole  40 mg Oral BID   potassium chloride   40 mEq Oral Q4H   rOPINIRole  1 mg Oral QHS   rosuvastatin  5 mg Oral QPM   sucralfate  1 g Oral TID WC & HS   tamsulosin  0.4 mg Oral QPM   thiamine  100 mg Oral Daily   Continuous Infusions:  sodium chloride 10 mL/hr at 03/01/21 1800   sodium chloride Stopped (02/28/21 1156)   amiodarone 30 mg/hr (03/02/21 0618)   meropenem (MERREM) IV Stopped (03/01/21 2302)   promethazine (PHENERGAN) injection (IM or IVPB) Stopped (03/01/21 2109)   sodium chloride irrigation Stopped (02/28/21 1908)     LOS: 7 days    Time spent: 40 minutes    Irine Seal,  MD [facility]   To contact the attending provider between 7A-7P or the covering provider during after hours 7P-7A, please log into the web site [email or web address] and access using universal [place] password for that web site. If you do not have the password, please call the hospital operator.  03/02/2021, 10:05 AM

## 2021-03-02 NOTE — Progress Notes (Signed)
Right upper extremity venous duplex has been completed. Preliminary results can be found in CV Proc through chart review.  Results were given to the patient's nurse, Liane Comber.  03/02/21 1:28 PM Kristopher [name] RVT

## 2021-03-02 NOTE — Progress Notes (Signed)
Subjective: Planing over the lesion over right forearm with tenderness of the entire forearm.   Antibiotics:  Anti-infectives (From admission, onward)    Start     Dose/Rate Route Frequency Ordered Stop   02/24/21 1000  meropenem (MERREM) 1 g in sodium chloride 0.9 % 100 mL IVPB        1 g 200 mL/hr over 30 Minutes Intravenous Every 12 hours 02/24/21 0957 03/03/21 0959   02/24/21 0600  ceFEPIme (MAXIPIME) 2 g in sodium chloride 0.9 % 100 mL IVPB  Status:  Discontinued        2 g 200 mL/hr over 30 Minutes Intravenous Every 12 hours 02/23/21 2124 02/24/21 0957   02/23/21 2130  ceFEPIme (MAXIPIME) 2 g in sodium chloride 0.9 % 100 mL IVPB  Status:  Discontinued        2 g 200 mL/hr over 30 Minutes Intravenous  Once 02/23/21 2036 02/23/21 2037   02/23/21 1630  ceFEPIme (MAXIPIME) 2 g in sodium chloride 0.9 % 100 mL IVPB        2 g 200 mL/hr over 30 Minutes Intravenous  Once 02/23/21 1620 02/23/21 1706       Medications: Scheduled Meds:  (feeding supplement) PROSource Plus  30 mL Oral BID BM   sodium chloride   Intravenous Once   sodium chloride   Intravenous Once   buPROPion  150 mg Oral Daily   Chlorhexidine Gluconate Cloth  6 each Topical Daily   feeding supplement  237 mL Oral Q24H   gabapentin  100 mg Oral TID   insulin aspart  0-15 Units Subcutaneous TID WC   insulin aspart  0-5 Units Subcutaneous QHS   insulin glargine-yfgn  12 Units Subcutaneous Daily   mouth rinse  15 mL Mouth Rinse BID   metoprolol tartrate  50 mg Oral BID   multivitamins with iron  1 tablet Oral Daily   niacin  500 mg Oral Daily   pantoprazole  40 mg Oral BID   rOPINIRole  1 mg Oral QHS   rosuvastatin  5 mg Oral QPM   sucralfate  1 g Oral TID WC & HS   tamsulosin  0.4 mg Oral QPM   thiamine  100 mg Oral Daily   Continuous Infusions:  sodium chloride 10 mL/hr at 03/02/21 1135   sodium chloride Stopped (02/28/21 1156)   amiodarone 30 mg/hr (03/02/21 1135)   meropenem (MERREM) IV  Stopped (03/02/21 1056)   promethazine (PHENERGAN) injection (IM or IVPB) Stopped (03/01/21 2109)   sodium chloride irrigation Stopped (02/28/21 1908)   PRN Meds:.sodium chloride, sodium chloride, baclofen, diphenhydrAMINE, metoprolol tartrate, morphine injection, nitroGLYCERIN, ondansetron (ZOFRAN) IV, promethazine (PHENERGAN) injection (IM or IVPB), traMADol    Objective: Weight change: -1.2 kg  Intake/Output Summary (Last 24 hours) at 03/02/2021 1409 Last data filed at 03/02/2021 1135 Gross per 24 hour  Intake 730.17 ml  Output 3501 ml  Net -2770.83 ml    Blood pressure (!) 154/78, pulse 85, temperature 98.1 F (36.7 C), temperature source Oral, resp. rate 16, height 5' 7"  (1.702 m), weight 77.2 kg, SpO2 99 %. Temp:  [98.1 F (36.7 C)-98.4 F (36.9 C)] 98.1 F (36.7 C) (09/14 1200) Pulse Rate:  [85-101] 85 (09/14 1200) Resp:  [16-24] 16 (09/14 1200) BP: (95-160)/(45-133) 154/78 (09/14 1200) SpO2:  [91 %-99 %] 99 % (09/14 1200) Weight:  [77.2 kg] 77.2 kg (09/14 0322)  Physical Exam: Physical Exam Vitals and nursing note reviewed.  Constitutional:  Appearance: He is well-developed.  HENT:     Head: Normocephalic and atraumatic.  Eyes:     Conjunctiva/sclera: Conjunctivae normal.  Cardiovascular:     Rate and Rhythm: Regular rhythm. Tachycardia present.  Pulmonary:     Effort: Pulmonary effort is normal. No respiratory distress.     Breath sounds: Normal breath sounds. No stridor. No wheezing.  Abdominal:     General: There is no distension.     Palpations: Abdomen is soft.  Musculoskeletal:        General: Normal range of motion.     Cervical back: Normal range of motion and neck supple.  Skin:    General: Skin is warm and dry.  Neurological:     General: No focal deficit present.     Mental Status: He is alert and oriented to person, place, and time.  Psychiatric:        Mood and Affect: Mood normal.        Behavior: Behavior normal.        Thought  Content: Thought content normal.        Judgment: Judgment normal.    Forearm is edematous and tender to palpation on the right with lesion noted below March 02, 2021:      CBC:    BMET Recent Labs    03/01/21 0219 03/02/21 0236  NA 132* 133*  K 2.9* 2.8*  CL 95* 92*  CO2 24 28  GLUCOSE 254* 164*  BUN 30* 25*  CREATININE 1.50* 1.12  CALCIUM 7.8* 7.6*      Liver Panel  Recent Labs    02/27/21 1658  PROT 5.8*  ALBUMIN 2.3*  AST 38  ALT 30  ALKPHOS 66  BILITOT 1.2  BILIDIR 0.4*  IBILI 0.8        Sedimentation Rate No results for input(s): ESRSEDRATE in the last 72 hours. C-Reactive Protein No results for input(s): CRP in the last 72 hours.  Micro Results: Recent Results (from the past 720 hour(s))  SARS Coronavirus 2 (TAT 6-24 hrs)     Status: None   Collection Time: 02/10/21 12:00 AM  Result Value Ref Range Status   SARS Coronavirus 2 RESULT: NEGATIVE  Final    Comment: RESULT: NEGATIVESARS-CoV-2 INTERPRETATION:A NEGATIVE  test result means that SARS-CoV-2 RNA was not present in the specimen above the limit of detection of this test. This does not preclude a possible SARS-CoV-2 infection and should not be used as the  sole basis for patient management decisions. Negative results must be combined with clinical observations, patient history, and epidemiological information. Optimum specimen types and timing for peak viral levels during infections caused by SARS-CoV-2  have not been determined. Collection of multiple specimens or types of specimens may be necessary to detect virus. Improper specimen collection and handling, sequence variability under primers/probes, or organism present below the limit of detection may  lead to false negative results. Positive and negative predictive values of testing are highly dependent on prevalence. False negative test results are more likely when prevalence of disease is high.The expected result is NEGATIVE.Fact S  heet for  Healthcare Providers: LocalChronicle.no Sheet for Patients: SalonLookup.es Reference Range - Negative   Urine Culture     Status: Abnormal   Collection Time: 02/19/21 11:59 PM   Specimen: Urine, Clean Catch  Result Value Ref Range Status   Specimen Description   Final    URINE, CLEAN CATCH Performed at [facility], [address] [place], [place]  [address]    Special Requests   Final    NONE Performed at [facility], [place] [address], [place], [place] [address]    Culture MULTIPLE SPECIES PRESENT, SUGGEST RECOLLECTION (A)  Final   Report Status 02/21/2021 FINAL  Final  Blood Culture (routine x 2)     Status: Abnormal   Collection Time: 02/23/21  4:05 PM   Specimen: BLOOD RIGHT FOREARM  Result Value Ref Range Status   Specimen Description   Final    BLOOD RIGHT FOREARM Performed at [facility] [address], [place], [place] [address]    Special Requests   Final    BOTTLES DRAWN AEROBIC AND ANAEROBIC Blood Culture adequate volume Performed at [place] [address], [place], [place] [address]    Culture  Setup Time   Final    GRAM NEGATIVE RODS IN BOTH AEROBIC AND ANAEROBIC BOTTLES CRITICAL RESULT CALLED TO, READ BACK BY AND VERIFIED WITH: PHARMD M BELL [id number] AT 907 AM BY CM Performed at [facility], [place] [address], [place], [place] [address]    Culture (A)  Final    KLEBSIELLA PNEUMONIAE Confirmed Extended Spectrum Beta-Lactamase Producer (ESBL).  In bloodstream infections from ESBL organisms, carbapenems are preferred over piperacillin/tazobactam. They are shown to have a lower risk of mortality.    Report Status 02/26/2021 FINAL  Final   Organism ID, Bacteria KLEBSIELLA PNEUMONIAE  Final      Susceptibility   Klebsiella pneumoniae - MIC*    AMPICILLIN >=32 RESISTANT Resistant     CEFAZOLIN >=64 RESISTANT  Resistant     CEFEPIME 2 SENSITIVE Sensitive     CEFTAZIDIME RESISTANT Resistant     CEFTRIAXONE >=64 RESISTANT Resistant     CIPROFLOXACIN 2 INTERMEDIATE Intermediate     GENTAMICIN >=16 RESISTANT Resistant     IMIPENEM <=0.25 SENSITIVE Sensitive     TRIMETH/SULFA >=320 RESISTANT Resistant     AMPICILLIN/SULBACTAM >=32 RESISTANT Resistant     PIP/TAZO 16 SENSITIVE Sensitive     * KLEBSIELLA PNEUMONIAE  Blood Culture ID Panel (Reflexed)     Status: Abnormal (Preliminary result)   Collection Time: 02/23/21  4:05 PM  Result Value Ref Range Status   Enterococcus faecalis NOT DETECTED NOT DETECTED Final   Enterococcus Faecium NOT DETECTED NOT DETECTED Final   Listeria monocytogenes NOT DETECTED NOT DETECTED Final   Staphylococcus species NOT DETECTED NOT DETECTED Final   Staphylococcus aureus (BCID) NOT DETECTED NOT DETECTED Final   Staphylococcus epidermidis NOT DETECTED NOT DETECTED Final   Staphylococcus lugdunensis NOT DETECTED NOT DETECTED Final   Streptococcus species NOT DETECTED NOT DETECTED Final   Streptococcus agalactiae NOT DETECTED NOT DETECTED Final   Streptococcus pneumoniae NOT DETECTED NOT DETECTED Final   Streptococcus pyogenes NOT DETECTED NOT DETECTED Final   A.calcoaceticus-baumannii NOT DETECTED NOT DETECTED Final   Bacteroides fragilis NOT DETECTED NOT DETECTED Final   Enterobacterales PENDING NOT DETECTED Incomplete   Enterobacter cloacae complex NOT DETECTED NOT DETECTED Final   Escherichia coli NOT DETECTED NOT DETECTED Final   Klebsiella aerogenes NOT DETECTED NOT DETECTED Final   Klebsiella oxytoca NOT DETECTED NOT DETECTED Final   Klebsiella pneumoniae DETECTED (A) NOT DETECTED Final    Comment: CRITICAL RESULT CALLED TO, READ BACK BY AND VERIFIED WITH: PHARMD M BELL [id number] AT 907 AM BY CM    Proteus species NOT DETECTED NOT DETECTED Final   Salmonella species NOT DETECTED NOT DETECTED Final   Serratia marcescens NOT  DETECTED NOT DETECTED Final    Haemophilus influenzae NOT DETECTED NOT DETECTED Final   Neisseria meningitidis NOT DETECTED NOT DETECTED Final   Pseudomonas aeruginosa NOT DETECTED NOT DETECTED Final   Stenotrophomonas maltophilia NOT DETECTED NOT DETECTED Final   Candida albicans NOT DETECTED NOT DETECTED Final   Candida auris NOT DETECTED NOT DETECTED Final   Candida glabrata NOT DETECTED NOT DETECTED Final   Candida krusei NOT DETECTED NOT DETECTED Final   Candida parapsilosis NOT DETECTED NOT DETECTED Final   Candida tropicalis NOT DETECTED NOT DETECTED Final   Cryptococcus neoformans/gattii NOT DETECTED NOT DETECTED Final   CTX-M ESBL DETECTED (A) NOT DETECTED Final    Comment: CRITICAL RESULT CALLED TO, READ BACK BY AND VERIFIED WITH: PHARMD M BELL [id number] AT 907 AM BY CM (NOTE) Extended spectrum beta-lactamase detected. Recommend a carbapenem as initial therapy.      Carbapenem resistance IMP NOT DETECTED NOT DETECTED Final   Carbapenem resistance KPC NOT DETECTED NOT DETECTED Final   Carbapenem resistance NDM NOT DETECTED NOT DETECTED Final   Carbapenem resist OXA 48 LIKE NOT DETECTED NOT DETECTED Final   Carbapenem resistance VIM NOT DETECTED NOT DETECTED Final    Comment: Performed at [facility], [place] [address], [place], [place] [address]  Blood Culture (routine x 2)     Status: Abnormal   Collection Time: 02/23/21  4:24 PM   Specimen: BLOOD  Result Value Ref Range Status   Specimen Description   Final    BLOOD LEFT ANTECUBITAL Performed at [facility] [address], [place], [place] [address]    Special Requests   Final    BOTTLES DRAWN AEROBIC AND ANAEROBIC Blood Culture adequate volume Performed at [facility] [address], [place], [place] [address]    Culture  Setup Time   Final    GRAM NEGATIVE RODS AEROBIC BOTTLE ONLY CRITICAL VALUE NOTED.  VALUE IS CONSISTENT WITH PREVIOUSLY REPORTED AND CALLED VALUE.    Culture (A)  Final     KLEBSIELLA PNEUMONIAE SUSCEPTIBILITIES PERFORMED ON PREVIOUS CULTURE WITHIN THE LAST 5 DAYS. Performed at [facility], [place] [address], [place],  [address]    Report Status 02/26/2021 FINAL  Final  Resp Panel by RT-PCR (Flu A&B, Covid) Nasopharyngeal Swab     Status: None   Collection Time: 02/23/21  4:44 PM   Specimen: Nasopharyngeal Swab; Nasopharyngeal(NP) swabs in vial transport medium  Result Value Ref Range Status   SARS Coronavirus 2 by RT PCR NEGATIVE NEGATIVE Final    Comment: (NOTE) SARS-CoV-2 target nucleic acids are NOT DETECTED.  The SARS-CoV-2 RNA is generally detectable in upper respiratory specimens during the acute phase of infection. The lowest concentration of SARS-CoV-2 viral copies this assay can detect is 138 copies/mL. A negative result does not preclude SARS-Cov-2 infection and should not be used as the sole basis for treatment or other patient management decisions. A negative result may occur with  improper specimen collection/handling, submission of specimen other than nasopharyngeal swab, presence of viral mutation(s) within the areas targeted by this assay, and inadequate number of viral copies(<138 copies/mL). A negative result must be combined with clinical observations, patient history, and epidemiological information. The expected result is Negative.  Fact Sheet for Patients:  EntrepreneurPulse.com.au  Fact Sheet for Healthcare Providers:  IncredibleEmployment.be  This test is no t yet approved or cleared by the [place] FDA and  has been authorized for detection and/or diagnosis of SARS-CoV-2 by  FDA under an Emergency Use Authorization (EUA). This EUA will remain  in effect (meaning this test can be used) for the duration of the COVID-19 declaration under Section 564(b)(1) of the Act, 21 U.S.C.section 360bbb-3(b)(1), unless the authorization is terminated  or revoked sooner.       Influenza  A by PCR NEGATIVE NEGATIVE Final   Influenza B by PCR NEGATIVE NEGATIVE Final    Comment: (NOTE) The Xpert Xpress SARS-CoV-2/FLU/RSV plus assay is intended as an aid in the diagnosis of influenza from Nasopharyngeal swab specimens and should not be used as a sole basis for treatment. Nasal washings and aspirates are unacceptable for Xpert Xpress SARS-CoV-2/FLU/RSV testing.  Fact Sheet for Patients: EntrepreneurPulse.com.au  Fact Sheet for Healthcare Providers: IncredibleEmployment.be  This test is not yet approved or cleared by the [place] FDA and has been authorized for detection and/or diagnosis of SARS-CoV-2 by FDA under an Emergency Use Authorization (EUA). This EUA will remain in effect (meaning this test can be used) for the duration of the COVID-19 declaration under Section 564(b)(1) of the Act, 21 U.S.C. section 360bbb-3(b)(1), unless the authorization is terminated or revoked.  Performed at [facility], [place] [address], [place], [place] [address]   Urine Culture     Status: Abnormal   Collection Time: 02/23/21  4:54 PM   Specimen: In/Out Cath Urine  Result Value Ref Range Status   Specimen Description   Final    IN/OUT CATH URINE Performed at [facility] [address], [place], [place] [address]    Special Requests   Final    NONE Performed at [facility], [place] [address], [place], [place] [address]    Culture (A)  Final    20,000 COLONIES/mL KLEBSIELLA PNEUMONIAE Confirmed Extended Spectrum Beta-Lactamase Producer (ESBL).  In bloodstream infections from ESBL organisms, carbapenems are preferred over piperacillin/tazobactam. They are shown to have a lower risk of mortality. 20,000 COLONIES/mL ENTEROCOCCUS FAECALIS    Report Status 02/26/2021 FINAL  Final   Organism ID, Bacteria KLEBSIELLA PNEUMONIAE (A)  Final   Organism ID, Bacteria ENTEROCOCCUS FAECALIS (A)   Final      Susceptibility   Enterococcus faecalis - MIC*    AMPICILLIN <=2 SENSITIVE Sensitive     NITROFURANTOIN <=16 SENSITIVE Sensitive     VANCOMYCIN 2 SENSITIVE Sensitive     * 20,000 COLONIES/mL ENTEROCOCCUS FAECALIS   Klebsiella pneumoniae - MIC*    AMPICILLIN >=32 RESISTANT Resistant     CEFAZOLIN >=64 RESISTANT Resistant     CEFEPIME >=32 RESISTANT Resistant     CEFTRIAXONE >=64 RESISTANT Resistant     CIPROFLOXACIN 2 INTERMEDIATE Intermediate     GENTAMICIN >=16 RESISTANT Resistant     IMIPENEM <=0.25 SENSITIVE Sensitive     NITROFURANTOIN 128 RESISTANT Resistant     TRIMETH/SULFA >=320 RESISTANT Resistant     AMPICILLIN/SULBACTAM >=32 RESISTANT Resistant     PIP/TAZO 16 SENSITIVE Sensitive     * 20,000 COLONIES/mL KLEBSIELLA PNEUMONIAE  MRSA Next Gen by PCR, Nasal     Status: None   Collection Time: 02/26/21 10:00 PM   Specimen: Nasal Mucosa; Nasal Swab  Result Value Ref Range Status   MRSA by PCR Next Gen NOT DETECTED NOT DETECTED Final    Comment: (NOTE) The GeneXpert MRSA Assay (FDA approved for NASAL specimens only), is one component of a comprehensive MRSA colonization surveillance program. It is not intended to diagnose MRSA infection nor to guide or monitor treatment for MRSA infections.  Test performance is not FDA approved in patients less than 47 years old. Performed at [facility], [place] [address] [place], [place] [address]     Studies/Results: DG Forearm Right  Result Date: 03/02/2021 CLINICAL DATA:  Right forearm pain. EXAM: RIGHT FOREARM - 2 VIEW COMPARISON:  None. FINDINGS: There is no evidence of fracture or other focal bone lesions. Soft tissue swelling of the proximal to mid forearm. Bandage material overlying dorsal and ulnar aspect of the forearm. IMPRESSION: 1. Soft tissue swelling of the proximal to mid forearm. No acute osseous abnormality. Electronically Signed   By: Titus Dubin M.D.   On: 03/02/2021 13:56   DG CHEST  PORT 1 VIEW  Result Date: 03/01/2021 CLINICAL DATA:  Dyspnea EXAM: PORTABLE CHEST 1 VIEW COMPARISON:  02/27/2021 FINDINGS: Cardiac shadow is stable. Loop recorder is again noted. Patchy airspace opacities are noted bilaterally but continue to improved when compare with the prior exam. No sizable effusion is noted. No bony abnormality is seen. Postsurgical changes in the cervical spine are noted. IMPRESSION: Persistent but improved airspace opacities right greater than left. Electronically Signed   By: Inez Catalina M.D.   On: 03/01/2021 11:13   VAS [place] UPPER EXTREMITY VENOUS DUPLEX  Result Date: 03/02/2021 UPPER VENOUS STUDY  Patient Name:  Kristopher [name]  Date of Exam:   03/02/2021 Medical Rec #: [id number]     Accession #:    [id number] Date of Birth: 1951/03/30      Patient Gender: M Patient Age:   52 years Exam Location:  [facility] Procedure:      VAS [place] UPPER EXTREMITY VENOUS DUPLEX Referring Phys: Rosalia Mcavoy VAN DAM --------------------------------------------------------------------------------  Indications: Pain Risk Factors: Trauma. Comparison Study: No prior studies. Performing Technologist: Oliver Hum RVT  Examination Guidelines: A complete evaluation includes B-mode imaging, spectral Doppler, color Doppler, and power Doppler as needed of all accessible portions of each vessel. Bilateral testing is considered an integral part of a complete examination. Limited examinations for reoccurring indications may be performed as noted.  Right Findings: +----------+------------+---------+-----------+----------+-------+ RIGHT     CompressiblePhasicitySpontaneousPropertiesSummary +----------+------------+---------+-----------+----------+-------+ IJV           Full       Yes       Yes                      +----------+------------+---------+-----------+----------+-------+ Subclavian    Full       Yes       Yes                       +----------+------------+---------+-----------+----------+-------+ Axillary      Full       Yes       Yes                      +----------+------------+---------+-----------+----------+-------+ Brachial      Full       Yes       Yes                      +----------+------------+---------+-----------+----------+-------+ Radial        Full                                          +----------+------------+---------+-----------+----------+-------+ Ulnar         Full                                          +----------+------------+---------+-----------+----------+-------+  Cephalic      None                                   Acute  +----------+------------+---------+-----------+----------+-------+ Basilic       Full                                          +----------+------------+---------+-----------+----------+-------+ Superficial thrombus located in the cephalic vein is noted to only be in the mid forearm.  Left Findings: +----------+------------+---------+-----------+----------+-------+ LEFT      CompressiblePhasicitySpontaneousPropertiesSummary +----------+------------+---------+-----------+----------+-------+ Subclavian    Full       Yes       Yes                      +----------+------------+---------+-----------+----------+-------+  Summary:  Right: No evidence of deep vein thrombosis in the upper extremity.  Left: No evidence of thrombosis in the subclavian. Findings consistent with acute superficial vein thrombosis involving the left cephalic vein.  *See table(s) above for measurements and observations.    Preliminary       Assessment/Plan:  INTERVAL HISTORY: Forearm pain as noted above   Principal Problem:   Severe Sepsis secondary to UTI [facility]) Active Problems:   Coronary artery disease involving native coronary artery of native heart without angina pectoris   Atrial fibrillation s/p PVI ablation at [facility] 2001   Obstructive sleep apnea  syndrome   Hepatic cirrhosis (HCC)   Type 2 diabetes mellitus without complications ([place])   AKI (acute kidney injury) ([place])   Anemia   Thrombocytopenia ([place])   Sepsis with acute renal failure without septic shock (HCC)   Hematuria   Dyspnea   Atrial fibrillation with RVR (HCC)   ESBL (extended spectrum beta-lactamase) producing bacteria infection   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 male with recent cystoscopy in August 26 with nephrolithotomy of large kidney stone and left ureteral stent placement, complicated by urinary retention need for Foley catheter placement which then became plugged and then needed to be replaced then admitted with complicated urinary tract infection with ESBL Klebsiella pneumonia and Enterococcus in urine and Klebsiella pneumonia, ESBL in bloodstream.  His course was also been complicated by hypoxia and atrial fibrillation with rapid ventricular response.  Now is complaining some right forearm pain with a lesion there.  #1 complicated urinary tract infection with ESBL Klebsiella and Enterococcus in urine and Klebsiella seeding the blood:  Today is day 7 of meropenem after which we will stop and observe him  #2 right forearm tenderness with lesion: Apparently this is an area where his amiodarone had infiltrated and he developed a bullous skin lesion that is erupted.  I ordered a Doppler to exclude deep venous fibrosis and has been found to have a superficial cephalic vein thrombus  Apply warm compresses to area several times a day.  Monitor for potential evolution to DVT  #3 hypoxia:  improving  #4 Atrial fibrillation; now back in normal sinus rhythm   LOS: 7 days   Alcide Evener 03/02/2021, 2:09 PM

## 2021-03-03 DIAGNOSIS — N179 Acute kidney failure, unspecified: Secondary | ICD-10-CM | POA: Diagnosis not present

## 2021-03-03 DIAGNOSIS — I2510 Atherosclerotic heart disease of native coronary artery without angina pectoris: Secondary | ICD-10-CM | POA: Diagnosis not present

## 2021-03-03 DIAGNOSIS — I4891 Unspecified atrial fibrillation: Secondary | ICD-10-CM | POA: Diagnosis not present

## 2021-03-03 DIAGNOSIS — I10 Essential (primary) hypertension: Secondary | ICD-10-CM | POA: Diagnosis not present

## 2021-03-03 DIAGNOSIS — E876 Hypokalemia: Secondary | ICD-10-CM

## 2021-03-03 LAB — CBC WITH DIFFERENTIAL/PLATELET
Abs Immature Granulocytes: 0.12 10*3/uL — ABNORMAL HIGH (ref 0.00–0.07)
Basophils Absolute: 0 10*3/uL (ref 0.0–0.1)
Basophils Relative: 0 %
Eosinophils Absolute: 0.2 10*3/uL (ref 0.0–0.5)
Eosinophils Relative: 2 %
HCT: 29.2 % — ABNORMAL LOW (ref 39.0–52.0)
Hemoglobin: 9.7 g/dL — ABNORMAL LOW (ref 13.0–17.0)
Immature Granulocytes: 1 %
Lymphocytes Relative: 9 %
Lymphs Abs: 0.9 10*3/uL (ref 0.7–4.0)
MCH: 29.7 pg (ref 26.0–34.0)
MCHC: 33.2 g/dL (ref 30.0–36.0)
MCV: 89.3 fL (ref 80.0–100.0)
Monocytes Absolute: 0.7 10*3/uL (ref 0.1–1.0)
Monocytes Relative: 7 %
Neutro Abs: 7.8 10*3/uL — ABNORMAL HIGH (ref 1.7–7.7)
Neutrophils Relative %: 81 %
Platelets: 253 10*3/uL (ref 150–400)
RBC: 3.27 MIL/uL — ABNORMAL LOW (ref 4.22–5.81)
RDW: 16 % — ABNORMAL HIGH (ref 11.5–15.5)
WBC: 9.7 10*3/uL (ref 4.0–10.5)
nRBC: 0 % (ref 0.0–0.2)

## 2021-03-03 LAB — GLUCOSE, CAPILLARY
Glucose-Capillary: 148 mg/dL — ABNORMAL HIGH (ref 70–99)
Glucose-Capillary: 150 mg/dL — ABNORMAL HIGH (ref 70–99)
Glucose-Capillary: 259 mg/dL — ABNORMAL HIGH (ref 70–99)
Glucose-Capillary: 177 mg/dL — ABNORMAL HIGH (ref 70–99)

## 2021-03-03 LAB — RENAL FUNCTION PANEL
Albumin: 2.2 g/dL — ABNORMAL LOW (ref 3.5–5.0)
Anion gap: 9 (ref 5–15)
BUN: 16 mg/dL (ref 8–23)
CO2: 29 mmol/L (ref 22–32)
Calcium: 7.4 mg/dL — ABNORMAL LOW (ref 8.9–10.3)
Chloride: 97 mmol/L — ABNORMAL LOW (ref 98–111)
Creatinine, Ser: 0.96 mg/dL (ref 0.61–1.24)
GFR, Estimated: >60 mL/min (ref >60)
Glucose, Bld: 105 mg/dL — ABNORMAL HIGH (ref 70–99)
Phosphorus: 1.2 mg/dL — ABNORMAL LOW (ref 2.5–4.6)
Potassium: 3 mmol/L — ABNORMAL LOW (ref 3.5–5.1)
Sodium: 135 mmol/L (ref 135–145)

## 2021-03-03 LAB — MAGNESIUM: Magnesium: 1.5 mg/dL — ABNORMAL LOW (ref 1.7–2.4)

## 2021-03-03 MED ORDER — POTASSIUM PHOSPHATES 15 MMOLE/5ML IV SOLN
30.0000 mmol | Freq: Once | INTRAVENOUS | Status: AC
  Administered 2021-03-03: 30 mmol via INTRAVENOUS
  Filled 2021-03-03: qty 10

## 2021-03-03 MED ORDER — POTASSIUM CHLORIDE CRYS ER 20 MEQ PO TBCR
40.0000 meq | EXTENDED_RELEASE_TABLET | ORAL | Status: AC
  Administered 2021-03-03 (×2): 40 meq via ORAL
  Filled 2021-03-03 (×2): qty 2

## 2021-03-03 MED ORDER — K PHOS MONO-SOD PHOS DI & MONO 155-852-130 MG PO TABS
250.0000 mg | ORAL_TABLET | Freq: Two times a day (BID) | ORAL | Status: DC
  Administered 2021-03-04: 250 mg via ORAL
  Filled 2021-03-03: qty 1

## 2021-03-03 MED ORDER — MAGNESIUM SULFATE 4 GM/100ML IV SOLN
4.0000 g | Freq: Once | INTRAVENOUS | Status: AC
  Administered 2021-03-03: 4 g via INTRAVENOUS
  Filled 2021-03-03: qty 100

## 2021-03-03 MED ORDER — DIPHENHYDRAMINE HCL 25 MG PO CAPS: 25.0000 mg | ORAL_CAPSULE | Freq: Once | ORAL | Status: DC

## 2021-03-03 NOTE — Evaluation (Signed)
Physical Therapy Evaluation Patient Details Name: Kristopher [name] MRN: [id number] DOB: Dec 15, 1950 Today's Date: 03/03/2021  History of Present Illness  Patient is a 70 year old male presenting with fevers, chills and hematuria. Patient admitted with severe sepsis due to UTI, AMS, and A-fib with RVR. PMH includes atrial fibrillation, coronary artery disease, nonalcoholic cirrhosis, dyslipidemia, type 2 diabetes mellitus, and urolithiasis  Clinical Impression  The patient very eager to  getting OOB and ambulating. Patient mildly unsteady if not supporting with 1 UE.   HR remained in  90's. Patient reported no dizziness considering  not being out of bed. Does report he has vasovagal syndrome "I get 5-10 second warning before I go down."  Recommend 24/7  supervision  initially . Pt admitted with above diagnosis.  Pt currently with functional limitations due to the deficits listed below (see PT Problem List). Pt will benefit from skilled PT to increase their independence and safety with mobility to allow discharge to the venue listed below.        Recommendations for follow up therapy are one component of a multi-disciplinary discharge planning process, led by the attending physician.  Recommendations may be updated based on patient status, additional functional criteria and insurance authorization.  Follow Up Recommendations Home health PT    Equipment Recommendations  None recommended by PT    Recommendations for Other Services       Precautions / Restrictions Precautions Precautions: Fall Precaution Comments: patient reports has vasovagal syndrome and gets "5-10 second warning before I go down" Restrictions Weight Bearing Restrictions: No      Mobility  Bed Mobility Overal bed mobility: Needs Assistance Bed Mobility: Supine to Sit     Supine to sit: Min assist;HOB elevated     General bed mobility comments: for trunk support to sit upright    Transfers Overall transfer level:  Needs assistance Equipment used: 2 person hand held assist Transfers: Sit to/from Stand Sit to Stand: Min assist;+2 safety/equipment         General transfer comment: min A x2 for safety as patient reports global stiffness from immobility, taking small steps initially with moderate improvement with continued ambulation in room.  Ambulation/Gait Ambulation/Gait assistance: Min Web designer (Feet): 40 Feet Assistive device: 1 person hand held assist Gait Pattern/deviations: Step-through pattern;Staggering right;Staggering left Gait velocity: decr   General Gait Details: guarded , imbalance when not holding  with 1 UE  Stairs            Wheelchair Mobility    Modified Rankin (Stroke Patients Only)       Balance Overall balance assessment: Needs assistance Sitting-balance support: Feet supported Sitting balance-Leahy Scale: Fair     Standing balance support: Single extremity supported Standing balance-Leahy Scale: Poor Standing balance comment: reliant on at least unilateral upper extremity support                             Pertinent Vitals/Pain Pain Assessment: Faces Faces Pain Scale: Hurts little more Pain Location: generalized stiffness Pain Descriptors / Indicators: Other (Comment) Pain Intervention(s): Monitored during session    Home Living Family/patient expects to be discharged to:: Private residence Living Arrangements: Spouse/significant other Available Help at Discharge: Family;Available PRN/intermittently Type of Home: House Home Access: Stairs to enter   Entrance Stairs-Number of Steps: 3 Home Layout: One level Home Equipment: None      Prior Function Level of Independence: Independent  Hand Dominance   Dominant Hand: Right    Extremity/Trunk Assessment   Upper Extremity Assessment Upper Extremity Assessment: Generalized weakness    Lower Extremity Assessment Lower Extremity Assessment:  Generalized weakness    Cervical / Trunk Assessment Cervical / Trunk Assessment: Normal;Other exceptions Cervical / Trunk Exceptions: mildly forward  flexed  Communication   Communication: No difficulties  Cognition Arousal/Alertness: Awake/alert Behavior During Therapy: WFL for tasks assessed/performed Overall Cognitive Status: Within Functional Limits for tasks assessed                                 General Comments: following directions appropriately      General Comments      Exercises     Assessment/Plan    PT Assessment Patient needs continued PT services  PT Problem List Decreased strength;Decreased mobility;Decreased knowledge of precautions;Decreased activity tolerance;Decreased balance       PT Treatment Interventions DME instruction;Therapeutic activities;Gait training;Therapeutic exercise;Patient/family education;Functional mobility training    PT Goals (Current goals can be found in the Care Plan section)  Acute Rehab PT Goals Patient Stated Goal: get moving again PT Goal Formulation: With patient Time For Goal Achievement: 03/17/21 Potential to Achieve Goals: Good    Frequency Min 3X/week   Barriers to discharge        Co-evaluation PT/OT/SLP Co-Evaluation/Treatment: Yes Reason for Co-Treatment: For patient/therapist safety PT goals addressed during session: Mobility/safety with mobility OT goals addressed during session: ADL's and self-care       AM-PAC PT "6 Clicks" Mobility  Outcome Measure Help needed turning from your back to your side while in a flat bed without using bedrails?: A Little Help needed moving from lying on your back to sitting on the side of a flat bed without using bedrails?: A Little Help needed moving to and from a bed to a chair (including a wheelchair)?: A Little Help needed standing up from a chair using your arms (e.g., wheelchair or bedside chair)?: A Little Help needed to walk in hospital room?: A  Little Help needed climbing 3-5 steps with a railing? : A Lot 6 Click Score: 17    End of Session   Activity Tolerance: Patient limited by fatigue Patient left: in chair;with call bell/phone within reach;with chair alarm set Nurse Communication: Mobility status PT Visit Diagnosis: Unsteadiness on feet (R26.81);Muscle weakness (generalized) (M62.81)    Time: 6720-9470 PT Time Calculation (min) (ACUTE ONLY): 22 min   Charges:   PT Evaluation $PT Eval Low Complexity: [place] [facility] Acute Rehabilitation Services Pager [phone] Office [phone]   Claretha Cooper 03/03/2021, 2:02 PM

## 2021-03-03 NOTE — Progress Notes (Signed)
Subjective: No issues overnight, catheter draining straw urine. Off oxygen Was having reaction to medication this AM.  Objective: Vital signs in last 24 hours: Temp:  [98.4 F (36.9 C)-99 F (37.2 C)] 98.9 F (37.2 C) (09/15 0800) Pulse Rate:  [78-96] 86 (09/15 1127) Resp:  [16-29] 16 (09/15 0600) BP: (115-164)/(58-98) 152/78 (09/15 1127) SpO2:  [92 %-100 %] 94 % (09/15 0600)  Intake/Output from previous day: 09/14 0701 - 09/15 0700 In: 1288.6 [P.O.:480; I.V.:443.9; IV Piggyback:364.7] Out: 3025 [Urine:3025] Intake/Output this shift: Total I/O In: -  Out: 675 [Urine:675]  Physical Exam:  General: Alert and oriented Non-labored bvreathing Catheter is draining straw colored urine.  Lab Results: Recent Labs    03/01/21 0219 03/02/21 0236 03/03/21 0238  HGB 9.6* 9.8* 9.7*  HCT 27.2* 28.2* 29.2*    BMET Recent Labs    03/02/21 0236 03/02/21 1338 03/03/21 0238  NA 133*  --  135  K 2.8* 3.3* 3.0*  CL 92*  --  97*  CO2 28  --  29  GLUCOSE 164*  --  105*  BUN 25*  --  16  CREATININE 1.12  --  0.96  CALCIUM 7.6*  --  7.4*      Studies/Results: DG Forearm Right  Result Date: 03/02/2021 CLINICAL DATA:  Right forearm pain. EXAM: RIGHT FOREARM - 2 VIEW COMPARISON:  None. FINDINGS: There is no evidence of fracture or other focal bone lesions. Soft tissue swelling of the proximal to mid forearm. Bandage material overlying dorsal and ulnar aspect of the forearm. IMPRESSION: 1. Soft tissue swelling of the proximal to mid forearm. No acute osseous abnormality. Electronically Signed   By: Titus Dubin M.D.   On: 03/02/2021 13:56   VAS [place] UPPER EXTREMITY VENOUS DUPLEX  Result Date: 03/02/2021 UPPER VENOUS STUDY  Patient Name:  Kristopher [name]  Date of Exam:   03/02/2021 Medical Rec #: [id number]     Accession #:    [id number] Date of Birth: [birth year]      Patient Gender: M Patient Age:   70 years Exam Location:  [facility] Procedure:      VAS [place] UPPER EXTREMITY  VENOUS DUPLEX Referring Phys: CORNELIUS VAN DAM --------------------------------------------------------------------------------  Indications: Pain Risk Factors: Trauma. Comparison Study: No prior studies. Performing Technologist: Oliver Hum RVT  Examination Guidelines: A complete evaluation includes B-mode imaging, spectral Doppler, color Doppler, and power Doppler as needed of all accessible portions of each vessel. Bilateral testing is considered an integral part of a complete examination. Limited examinations for reoccurring indications may be performed as noted.  Right Findings: +----------+------------+---------+-----------+----------+-------+ RIGHT     CompressiblePhasicitySpontaneousPropertiesSummary +----------+------------+---------+-----------+----------+-------+ IJV           Full       Yes       Yes                      +----------+------------+---------+-----------+----------+-------+ Subclavian    Full       Yes       Yes                      +----------+------------+---------+-----------+----------+-------+ Axillary      Full       Yes       Yes                      +----------+------------+---------+-----------+----------+-------+ Brachial      Full       Yes  Yes                      +----------+------------+---------+-----------+----------+-------+ Radial        Full                                          +----------+------------+---------+-----------+----------+-------+ Ulnar         Full                                          +----------+------------+---------+-----------+----------+-------+ Cephalic      None                                   Acute  +----------+------------+---------+-----------+----------+-------+ Basilic       Full                                          +----------+------------+---------+-----------+----------+-------+ Superficial thrombus located in the cephalic vein is noted to only be in the mid  forearm.  Left Findings: +----------+------------+---------+-----------+----------+-------+ LEFT      CompressiblePhasicitySpontaneousPropertiesSummary +----------+------------+---------+-----------+----------+-------+ Subclavian    Full       Yes       Yes                      +----------+------------+---------+-----------+----------+-------+  Summary:  Right: No evidence of deep vein thrombosis in the upper extremity.  Left: No evidence of thrombosis in the subclavian. Findings consistent with acute superficial vein thrombosis involving the right cephalic vein.  *See table(s) above for measurements and observations.  Diagnosing physician: Monica Martinez MD Electronically signed by Monica Martinez MD on 03/02/2021 at 3:00:02 PM.    Final     Assessment/Plan: 1.  Patient has gross hematuria which initially was from kidney surgery.  That bleeding has stopped.  He does have an organized clot within his bladder which will not be able to irrigate through the Foley catheter.  To remove this he would not need to go to the operating room.  However, over time this will dissolve and come out on its own.   - voiding trial today  2.  The patient's ultrasound demonstrated some lower pole pelvic caliectasis, but otherwise no hydroureteronephrosis or stones.  He did have some clot within the bladder, this is likely lysing and improving.  The stent was not well visualized, but did appear to be unchanged in position compared paired to the CAT scan that he had upon admission on the [facility].   - kidney function normalizing  3.  The patient is recovering from urosepsis and is on meropenem.  We will continue this per ID. - off abx now.  4. Recurrent afib - seems to now be back in NSR with plans to stop amio prior to discharge.  Hopeful this is all stress induced from sepsis and things continue to trend positive for him.  Apppreciate Cardiology's help.  If anticoagulation is thought to be necessary, I would  like to get his ureteral stent out before that is started, which would be next week, but will defer to cardiology to start if they feel  it should be started sooner.  Has floor orders, best guess would be discharge on Friday.  Hopefully he'll be discharged without a foley and off abx.    LOS: 8 days   [name] 03/03/2021, 12:48 PM

## 2021-03-03 NOTE — Progress Notes (Signed)
PROGRESS NOTE    Kristopher [name]  LOV:[id number] DOB: 1950/12/15 DOA: 02/23/2021 PCP: Townsend Roger, MD   Chief Complaint  Patient presents with   Weakness   Nausea    Brief Narrative:   [name] was admitted to the hospital with the working diagnosis of severe sepsis due to urinary tract infection, Klebsiella bacteremia, ESBL, Hospitalization complicated with atrial fibrillation with RVR, acute heart failure and ARDS with acute hypoxemic respiratory failure and acute metabolic encephalopathy.    70 year old male past medical history for atrial fibrillation, coronary artery disease, nonalcoholic cirrhosis, dyslipidemia, type 2 diabetes mellitus, and urolithiasis who presented with fevers, chills and hematuria. 8/26 patient underwent cystoscopy, left retrograde pyelogram with interpretation, left percutaneous renal access, left nephrolithotomy, left nephrostogram, left ureteral stent. Postprocedure he had a Foley catheter placed for urinary retention.   On 9/3 patient returned to the ED because urinary retention.  Apparently his Foley catheter was blocked by blood clot at home and he removed it himself.  Foley catheter was replaced and the patient was discharged home.   Patient continued to have fevers, chills along with back pain at home that prompted him to come back to the hospital.  On his initial physical examination his temperature was 103 F, blood pressure 87/59, heart rate 155, respiratory 28, oxygen saturation 72% on room air.  He was awake and alert, dry mucous membranes, lungs clear to auscultation bilaterally, heart S1-S2, tachycardic, abdomen soft nontender.   Sodium 136, potassium 4.1, chloride 101, bicarb 24, glucose 235, BUN 35, creatinine 2.1, AST 113, ALT 27, high sensitive troponin 39, lactic acid 1.2, white count 9.0, hemoglobin 6.8, hematocrit 20.1, platelets 125. SARS COVID-19 negative.   Urinalysis > 50 white cells, > 50 red cells.  Brown color, turbid.   CT of the  abdomen pelvis left double-J ureteral stent in place.  No hydroureter.  Several mildly dilated loops of small bowel in the upper quadrant, no obvious transition to indicate obstruction.   Chest radiograph no infiltrates.   EKG 107 bpm, normal axis, normal intervals, sinus rhythm, no significant ST segment changes, negative T waves V4-V6.   Patient was placed on antibiotic therapy and IV fluids.   Culture positive for multidrug resistant Klebsiella.   09/09 patient developed atrial fibrillation with RVR, placed on diltiazem infusion, converting to sinus rhythm.  PRBC transfusion for acute blood loss anemia due to hematuria.    Patient transferred to step down unit on 09/10, because difficult to control atrial fibrillation with RVR along with altered mental status. CVA work up with heat CT and brain MRI negative.    09/11 difficult to control atrial fibrillation, positive signs of hypoperfusion. Patient started ton amiodarone infusion converting to sinus rhythm.     Assessment & Plan:   Principal Problem:   Severe Sepsis secondary to UTI [facility]) Active Problems:   Coronary artery disease involving native coronary artery of native heart without angina pectoris   Atrial fibrillation s/p PVI ablation at [facility] 2001   Obstructive sleep apnea syndrome   Cirrhosis ([place])   Type 2 diabetes mellitus without complications ([place])   AKI (acute kidney injury) ([place])   Anemia   Thrombocytopenia ([place])   Sepsis with acute renal failure without septic shock (HCC)   Hematuria   Dyspnea   Atrial fibrillation with RVR (HCC)   ESBL (extended spectrum beta-lactamase) producing bacteria infection   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  Currently afebrile.  Normalization of WBCs. -Hemodynamic improvement. -Continue IV Merrem as recommended per ID to complete a 7-day course of treatment with end date of 03/03/2021. -Monitor off of antibiotics. -ID following and appreciate input and recommendations.  2.  Right forearm pain/swelling/superficial cephalic vein thrombosis -Patient and wife with concerns for right forearm infection. -Wife concerned about a MRSA infection. -Plain films obtained with no fracture and consistent with soft tissue swelling.   -Dopplers obtained negative for DVT however did show superficial cephalic vein thrombus.   -Currently placed on warm compresses per ID.  3.  Acute hypoxemic respiratory failure secondary to ARDS and acute diastolic heart failure/paroxysmal atrial fibrillation (status post ablation in the past) -Patient with clinical improvement currently on room air with sats of 97%.  -On amiodarone drip currently normal sinus rhythm with some occasional PVCs. -A. fib likely secondary to sepsis and acute issues. -2D echo with EF of 50 to 55%, moderate hypokinesis of left ventricular basal inferior wall and inferior septal wall, small pericardial effusion. -Patient status post IV Lasix which is currently on hold, with urine output of 3.025 L over the past 24 hours. -Patient is currently -14.944 L during this hospitalization. -Was on IV Lasix which are currently on hold. - patient converted to normal sinus rhythm.  Patient -Currently on IV amiodarone per cardiology recommendations to continue until acute issues have improved and subsequently discontinued. -Diltiazem drip has been weaned off. -Continue current dose of oral Lopressor. -Noted to have been on Cardizem CD1 20 mg every 36 hours.  Cardiology who are recommending resumption of this on discharge. -Anticoagulation on hold due to recent bleeding. -Cardiology following and appreciate input and  recommendations.  4.  Acute blood loss anemia secondary to hematuria/thrombocytopenia -Status post transfusion 3 units packed red blood cells. -Hemoglobin stable at 9.7.   -Hematuria slowly clearing up.   -Follow H&H.   -Transfusion threshold hemoglobin < 7.   5.  Acute kidney injury/hyponatremia/hypokalemia/hypomagnesemia/hypophosphatemia/anion gap metabolic acidosis -Patient on diuretics likely leading to electrolyte abnormalities. -Renal function improved with creatinine currently at 0.96. -Urine output of 3.0 to 5 L over the past 24 hours. -Potassium at 3.0, Kadolph 40 mEq p.o. every 4 hours x2 doses. -Magnesium at 1.5 we will give magnesium sulfate 4 g IV x1. -Phosphorus at 1.2 we will give K-Phos 30 mmol IV x1. -Repeat labs in the morning.  6.  Acute metabolic encephalopathy -Likely secondary to acute illness in the setting of acute kidney injury. -CT head, MRI brain negative for acute abnormalities. -Improving clinically. -Continue current regimen of tramadol and baclofen for back pain control and bupropion for depression. -Neurontin, Wellbutrin doses adjusted due to AKI in the setting of metabolic encephalopathy. -Supportive care.  7.  Diabetes mellitus type 2/dyslipidemia -Hemoglobin A1c 7.4 -CBG at 148 this morning.  -Continue Semglee 12 units daily.  SSI.   -Continue statin.   8.  Acute gastritis -Continue PPI, sucralfate.  9.  History of pericarditis with chronic pericardial effusion -Moderate effusion noted on echo 09/2019. -Small effusion noted on echo during this admission. -Per cardiology.  10.  Elevated troponin/history of CAD -Status post DES to RCA in 2015 -2D echo with EF of 50 to 55%, moderate hypokinesis of left ventricular basal inferior wall and inferior septal wall, small pericardial effusion. -Patient noted to  be on DAPT with aspirin and Plavix since 2015 currently on hold due to hematuria. -Urology to advise when DAPT with aspirin and Plavix may  be resumed. -Continue beta-blocker, statin. -Per cardiology troponin elevation likely secondary to demand ischemia. -No further cardiac work-up needed at this time per cardiology.  -   DVT prophylaxis: SCDs Code Status: Full Family Communication: Updated patient.  No family at bedside. Disposition:   Status is: Inpatient  Remains inpatient appropriate because:IV treatments appropriate due to intensity of illness or inability to take PO  Dispo: The patient is from: Home              Anticipated d/c is to: TBD              Patient currently is not medically stable to d/c.   Difficult to place patient No       Consultants:  ID: Dr. Tommy Medal 02/28/2021 Cardiology: Dr. Radford Pax 02/28/2021 PCCM: Dr. Chase Caller 02/27/2021 Telemetry neurology: Dr.Bhagat 02/26/2021 Urology: Dr. Louis Meckel 02/24/2021   Procedures:  CT head 02/23/2021, 02/26/2021 CT abdomen and pelvis 02/23/2021 Chest x-ray 03/01/2021, 02/27/2021, 02/26/2021, 02/23/2021 MRI brain 02/26/2021 Abdominal ultrasound 02/27/2021 2D echo 02/28/2021 Abdominal films 02/28/2022 Transfusion 3 units packed red blood cells Right upper extremity Doppler 03/02/2021  Antimicrobials:  IV cefepime 02/23/2021>>>>> 02/24/2021 IV Merrem 02/24/2021>>>>>> 03/03/2021   Subjective: Patient sitting up in chair.  Denies any chest pain.  No shortness of breath.  No abdominal pain.  States urine has been pinkish ever since stones were removed.  Tolerating current diet.  Still with some pain in the right forearm.    Objective: Vitals:   03/03/21 0404 03/03/21 0500 03/03/21 0600 03/03/21 0800  BP:  (!) 159/87 (!) 152/76   Pulse:  86 85   Resp:  (!) 21 16   Temp: 98.4 F (36.9 C)   98.9 F (37.2 C)  TempSrc: Axillary   Oral  SpO2:  97% 94%   Weight:      Height:        Intake/Output Summary (Last 24 hours) at 03/03/2021 0923 Last data filed at 03/03/2021 0629 Gross per 24 hour  Intake 1288.58 ml  Output 3025 ml  Net -1736.42 ml    Filed Weights    02/23/21 1541 03/01/21 0530 03/02/21 0322  Weight: 72.5 kg 78.4 kg 77.2 kg    Examination:  General exam: : NAD Respiratory system: CTA B.  No wheezes, no rhonchi.  Speaking in full sentences.  Normal respiratory effort. Cardiovascular system: Regular rate and rhythm no murmurs rubs or gallops.  No JVD.  No lower extremity edema.  Gastrointestinal system: Abdomen soft, nontender, nondistended, positive bowel sounds.  No rebound.  No guarding. Central nervous system: Alert and oriented. No focal neurological deficits. Extremities: Right forearm with bandages with some swelling.  Skin: No rashes, lesions or ulcers Psychiatry: Judgement and insight appear normal. Mood & affect appropriate.  Data Reviewed: I have personally reviewed following labs and imaging studies  CBC: Recent Labs  Lab 02/25/21 0332 02/25/21 1718 02/27/21 0242 02/28/21 0254 03/01/21 0219 03/02/21 0236 03/03/21 0238  WBC 4.2   < > 8.2 11.5* 9.2 8.6 9.7  NEUTROABS 3.8  --  7.0 10.0* 7.8*  --  7.8*  HGB 7.6*   < > 9.0* 10.4* 9.6* 9.8* 9.7*  HCT 22.2*   < > 26.4* 30.4* 27.2* 28.2* 29.2*  MCV 91.0   < > 90.1 89.7 87.7 87.3 89.3  PLT 93*   < > 121* 181  189 221 253   < > = values in this interval not displayed.     Basic Metabolic Panel: Recent Labs  Lab 02/27/21 0242 02/27/21 1435 02/28/21 0254 03/01/21 0219 03/02/21 0236 03/02/21 1338 03/03/21 0238  NA 130*  --  132* 132* 133*  --  135  K 3.2*  --  3.3* 2.9* 2.8* 3.3* 3.0*  CL 97*  --  96* 95* 92*  --  97*  CO2 17*  --  17* 24 28  --  29  GLUCOSE 231*  --  269* 254* 164*  --  105*  BUN 40*  --  40* 30* 25*  --  16  CREATININE 2.16*  --  2.03* 1.50* 1.12  --  0.96  CALCIUM 7.4*  --  8.1* 7.8* 7.6*  --  7.4*  MG  --  1.9 1.8 1.6* 1.6*  --  1.5*  PHOS 2.2*  --   --   --   --   --  1.2*     GFR: Estimated Creatinine Clearance: 68.9 mL/min (by C-G formula based on SCr of 0.96 mg/dL).  Liver Function Tests: Recent Labs  Lab 02/27/21 1658  03/03/21 0238  AST 38  --   ALT 30  --   ALKPHOS 66  --   BILITOT 1.2  --   PROT 5.8*  --   ALBUMIN 2.3* 2.2*     CBG: Recent Labs  Lab 03/02/21 0726 03/02/21 1133 03/02/21 1657 03/02/21 2114 03/03/21 0741  GLUCAP 161* 136* 160* 75 148*      Recent Results (from the past 240 hour(s))  Blood Culture (routine x 2)     Status: Abnormal   Collection Time: 02/23/21  4:05 PM   Specimen: BLOOD RIGHT FOREARM  Result Value Ref Range Status   Specimen Description   Final    BLOOD RIGHT FOREARM Performed at [facility], [place] [address] [place], [place] [address]    Special Requests   Final    BOTTLES DRAWN AEROBIC AND ANAEROBIC Blood Culture adequate volume Performed at [facility] [address] [place], [place] [address]    Culture  Setup Time   Final    GRAM NEGATIVE RODS IN BOTH AEROBIC AND ANAEROBIC BOTTLES CRITICAL RESULT CALLED TO, READ BACK BY AND VERIFIED WITH: PHARMD M BELL [id number] AT 907 AM BY CM Performed at [facility], [place] [address], [place], [place] [address]    Culture (A)  Final    KLEBSIELLA PNEUMONIAE Confirmed Extended Spectrum Beta-Lactamase Producer (ESBL).  In bloodstream infections from ESBL organisms, carbapenems are preferred over piperacillin/tazobactam. They are shown to have a lower risk of mortality.    Report Status 02/26/2021 FINAL  Final   Organism ID, Bacteria KLEBSIELLA PNEUMONIAE  Final      Susceptibility   Klebsiella pneumoniae - MIC*    AMPICILLIN >=32 RESISTANT Resistant     CEFAZOLIN >=64 RESISTANT Resistant     CEFEPIME 2 SENSITIVE Sensitive     CEFTAZIDIME RESISTANT Resistant     CEFTRIAXONE >=64 RESISTANT Resistant     CIPROFLOXACIN 2 INTERMEDIATE Intermediate     GENTAMICIN >=16 RESISTANT Resistant     IMIPENEM <=0.25 SENSITIVE Sensitive     TRIMETH/SULFA >=320 RESISTANT Resistant     AMPICILLIN/SULBACTAM >=32 RESISTANT Resistant     PIP/TAZO 16 SENSITIVE Sensitive     *  KLEBSIELLA PNEUMONIAE  Blood Culture ID Panel (Reflexed)     Status: Abnormal (Preliminary result)  Collection Time: 02/23/21  4:05 PM  Result Value Ref Range Status   Enterococcus faecalis NOT DETECTED NOT DETECTED Final   Enterococcus Faecium NOT DETECTED NOT DETECTED Final   Listeria monocytogenes NOT DETECTED NOT DETECTED Final   Staphylococcus species NOT DETECTED NOT DETECTED Final   Staphylococcus aureus (BCID) NOT DETECTED NOT DETECTED Final   Staphylococcus epidermidis NOT DETECTED NOT DETECTED Final   Staphylococcus lugdunensis NOT DETECTED NOT DETECTED Final   Streptococcus species NOT DETECTED NOT DETECTED Final   Streptococcus agalactiae NOT DETECTED NOT DETECTED Final   Streptococcus pneumoniae NOT DETECTED NOT DETECTED Final   Streptococcus pyogenes NOT DETECTED NOT DETECTED Final   A.calcoaceticus-baumannii NOT DETECTED NOT DETECTED Final   Bacteroides fragilis NOT DETECTED NOT DETECTED Final   Enterobacterales PENDING NOT DETECTED Incomplete   Enterobacter cloacae complex NOT DETECTED NOT DETECTED Final   Escherichia coli NOT DETECTED NOT DETECTED Final   Klebsiella aerogenes NOT DETECTED NOT DETECTED Final   Klebsiella oxytoca NOT DETECTED NOT DETECTED Final   Klebsiella pneumoniae DETECTED (A) NOT DETECTED Final    Comment: CRITICAL RESULT CALLED TO, READ BACK BY AND VERIFIED WITH: PHARMD M BELL [id number] AT 907 AM BY CM    Proteus species NOT DETECTED NOT DETECTED Final   Salmonella species NOT DETECTED NOT DETECTED Final   Serratia marcescens NOT DETECTED NOT DETECTED Final   Haemophilus influenzae NOT DETECTED NOT DETECTED Final   Neisseria meningitidis NOT DETECTED NOT DETECTED Final   Pseudomonas aeruginosa NOT DETECTED NOT DETECTED Final   Stenotrophomonas maltophilia NOT DETECTED NOT DETECTED Final   Candida albicans NOT DETECTED NOT DETECTED Final   Candida auris NOT DETECTED NOT DETECTED Final   Candida glabrata NOT DETECTED NOT DETECTED Final   Candida  krusei NOT DETECTED NOT DETECTED Final   Candida parapsilosis NOT DETECTED NOT DETECTED Final   Candida tropicalis NOT DETECTED NOT DETECTED Final   Cryptococcus neoformans/gattii NOT DETECTED NOT DETECTED Final   CTX-M ESBL DETECTED (A) NOT DETECTED Final    Comment: CRITICAL RESULT CALLED TO, READ BACK BY AND VERIFIED WITH: PHARMD M BELL [id number] AT 907 AM BY CM (NOTE) Extended spectrum beta-lactamase detected. Recommend a carbapenem as initial therapy.      Carbapenem resistance IMP NOT DETECTED NOT DETECTED Final   Carbapenem resistance KPC NOT DETECTED NOT DETECTED Final   Carbapenem resistance NDM NOT DETECTED NOT DETECTED Final   Carbapenem resist OXA 48 LIKE NOT DETECTED NOT DETECTED Final   Carbapenem resistance VIM NOT DETECTED NOT DETECTED Final    Comment: Performed at [facility], [place] [address], [place], [place] [address]  Blood Culture (routine x 2)     Status: Abnormal   Collection Time: 02/23/21  4:24 PM   Specimen: BLOOD  Result Value Ref Range Status   Specimen Description   Final    BLOOD LEFT ANTECUBITAL Performed at [facility] [address], [place], [place] [address]    Special Requests   Final    BOTTLES DRAWN AEROBIC AND ANAEROBIC Blood Culture adequate volume Performed at [facility] [address], [place], [place] [address]    Culture  Setup Time   Final    GRAM NEGATIVE RODS AEROBIC BOTTLE ONLY CRITICAL VALUE NOTED.  VALUE IS CONSISTENT WITH PREVIOUSLY REPORTED AND CALLED VALUE.    Culture (A)  Final    KLEBSIELLA PNEUMONIAE SUSCEPTIBILITIES PERFORMED ON PREVIOUS CULTURE WITHIN THE LAST 5 DAYS. Performed at [facility], [place]  [address], [place], [place] [address]    Report Status 02/26/2021 FINAL  Final  Resp Panel by RT-PCR (Flu A&B, Covid) Nasopharyngeal Swab     Status: None   Collection Time: 02/23/21  4:44 PM   Specimen: Nasopharyngeal Swab; Nasopharyngeal(NP) swabs in vial transport  medium  Result Value Ref Range Status   SARS Coronavirus 2 by RT PCR NEGATIVE NEGATIVE Final    Comment: (NOTE) SARS-CoV-2 target nucleic acids are NOT DETECTED.  The SARS-CoV-2 RNA is generally detectable in upper respiratory specimens during the acute phase of infection. The lowest concentration of SARS-CoV-2 viral copies this assay can detect is 138 copies/mL. A negative result does not preclude SARS-Cov-2 infection and should not be used as the sole basis for treatment or other patient management decisions. A negative result may occur with  improper specimen collection/handling, submission of specimen other than nasopharyngeal swab, presence of viral mutation(s) within the areas targeted by this assay, and inadequate number of viral copies(<138 copies/mL). A negative result must be combined with clinical observations, patient history, and epidemiological information. The expected result is Negative.  Fact Sheet for Patients:  EntrepreneurPulse.com.au  Fact Sheet for Healthcare Providers:  IncredibleEmployment.be  This test is no t yet approved or cleared by the [place] FDA and  has been authorized for detection and/or diagnosis of SARS-CoV-2 by FDA under an Emergency Use Authorization (EUA). This EUA will remain  in effect (meaning this test can be used) for the duration of the COVID-19 declaration under Section 564(b)(1) of the Act, 21 U.S.C.section 360bbb-3(b)(1), unless the authorization is terminated  or revoked sooner.       Influenza A by PCR NEGATIVE NEGATIVE Final   Influenza B by PCR NEGATIVE NEGATIVE Final    Comment: (NOTE) The Xpert Xpress SARS-CoV-2/FLU/RSV plus assay is intended as an aid in the diagnosis of influenza from Nasopharyngeal swab specimens and should not be used as a sole basis for treatment. Nasal washings and aspirates are unacceptable for Xpert Xpress SARS-CoV-2/FLU/RSV testing.  Fact Sheet for  Patients: EntrepreneurPulse.com.au  Fact Sheet for Healthcare Providers: IncredibleEmployment.be  This test is not yet approved or cleared by the [place] FDA and has been authorized for detection and/or diagnosis of SARS-CoV-2 by FDA under an Emergency Use Authorization (EUA). This EUA will remain in effect (meaning this test can be used) for the duration of the COVID-19 declaration under Section 564(b)(1) of the Act, 21 U.S.C. section 360bbb-3(b)(1), unless the authorization is terminated or revoked.  Performed at [facility], [place] [address] [place], [place] [address]   Urine Culture     Status: Abnormal   Collection Time: 02/23/21  4:54 PM   Specimen: In/Out Cath Urine  Result Value Ref Range Status   Specimen Description   Final    IN/OUT CATH URINE Performed at [facility] [address], [place], [place] [address]    Special Requests   Final    NONE Performed at [facility], [place] [address] [place], [place] [address]    Culture (A)  Final    20,000 COLONIES/mL KLEBSIELLA PNEUMONIAE Confirmed Extended Spectrum Beta-Lactamase Producer (ESBL).  In bloodstream infections from ESBL organisms, carbapenems are preferred over piperacillin/tazobactam. They are shown to have a lower risk of mortality. 20,000 COLONIES/mL ENTEROCOCCUS FAECALIS    Report Status 02/26/2021 FINAL  Final   Organism ID, Bacteria KLEBSIELLA PNEUMONIAE (A)  Final   Organism ID, Bacteria ENTEROCOCCUS FAECALIS (A)  Final  Susceptibility   Enterococcus faecalis - MIC*    AMPICILLIN <=2 SENSITIVE Sensitive     NITROFURANTOIN <=16 SENSITIVE Sensitive     VANCOMYCIN 2 SENSITIVE Sensitive     * 20,000 COLONIES/mL ENTEROCOCCUS FAECALIS   Klebsiella pneumoniae - MIC*    AMPICILLIN >=32 RESISTANT Resistant     CEFAZOLIN >=64 RESISTANT Resistant     CEFEPIME >=32 RESISTANT Resistant     CEFTRIAXONE >=64  RESISTANT Resistant     CIPROFLOXACIN 2 INTERMEDIATE Intermediate     GENTAMICIN >=16 RESISTANT Resistant     IMIPENEM <=0.25 SENSITIVE Sensitive     NITROFURANTOIN 128 RESISTANT Resistant     TRIMETH/SULFA >=320 RESISTANT Resistant     AMPICILLIN/SULBACTAM >=32 RESISTANT Resistant     PIP/TAZO 16 SENSITIVE Sensitive     * 20,000 COLONIES/mL KLEBSIELLA PNEUMONIAE  MRSA Next Gen by PCR, Nasal     Status: None   Collection Time: 02/26/21 10:00 PM   Specimen: Nasal Mucosa; Nasal Swab  Result Value Ref Range Status   MRSA by PCR Next Gen NOT DETECTED NOT DETECTED Final    Comment: (NOTE) The GeneXpert MRSA Assay (FDA approved for NASAL specimens only), is one component of a comprehensive MRSA colonization surveillance program. It is not intended to diagnose MRSA infection nor to guide or monitor treatment for MRSA infections. Test performance is not FDA approved in patients less than 46 years old. Performed at [facility], [place] [address], [place], [place] [address]           Radiology Studies: DG Forearm Right  Result Date: 03/02/2021 CLINICAL DATA:  Right forearm pain. EXAM: RIGHT FOREARM - 2 VIEW COMPARISON:  None. FINDINGS: There is no evidence of fracture or other focal bone lesions. Soft tissue swelling of the proximal to mid forearm. Bandage material overlying dorsal and ulnar aspect of the forearm. IMPRESSION: 1. Soft tissue swelling of the proximal to mid forearm. No acute osseous abnormality. Electronically Signed   By: Titus Dubin M.D.   On: 03/02/2021 13:56   DG CHEST PORT 1 VIEW  Result Date: 03/01/2021 CLINICAL DATA:  Dyspnea EXAM: PORTABLE CHEST 1 VIEW COMPARISON:  02/27/2021 FINDINGS: Cardiac shadow is stable. Loop recorder is again noted. Patchy airspace opacities are noted bilaterally but continue to improved when compare with the prior exam. No sizable effusion is noted. No bony abnormality is seen. Postsurgical changes in the cervical spine  are noted. IMPRESSION: Persistent but improved airspace opacities right greater than left. Electronically Signed   By: Inez Catalina M.D.   On: 03/01/2021 11:13   VAS [place] UPPER EXTREMITY VENOUS DUPLEX  Result Date: 03/02/2021 UPPER VENOUS STUDY  Patient Name:  AARION METZGAR  Date of Exam:   03/02/2021 Medical Rec #: [id number]     Accession #:    [id number] Date of Birth: 01/01/1951      Patient Gender: M Patient Age:   34 years Exam Location:  [facility] Procedure:      VAS [place] UPPER EXTREMITY VENOUS DUPLEX Referring Phys: CORNELIUS VAN DAM --------------------------------------------------------------------------------  Indications: Pain Risk Factors: Trauma. Comparison Study: No prior studies. Performing Technologist: Oliver Hum RVT  Examination Guidelines: A complete evaluation includes B-mode imaging, spectral Doppler, color Doppler, and power Doppler as needed of all accessible portions of each vessel. Bilateral testing is considered an integral part of a complete examination. Limited examinations for reoccurring indications may be performed as noted.  Right Findings: +----------+------------+---------+-----------+----------+-------+ RIGHT     CompressiblePhasicitySpontaneousPropertiesSummary +----------+------------+---------+-----------+----------+-------+ IJV  Full       Yes       Yes                      +----------+------------+---------+-----------+----------+-------+ Subclavian    Full       Yes       Yes                      +----------+------------+---------+-----------+----------+-------+ Axillary      Full       Yes       Yes                      +----------+------------+---------+-----------+----------+-------+ Brachial      Full       Yes       Yes                      +----------+------------+---------+-----------+----------+-------+ Radial        Full                                           +----------+------------+---------+-----------+----------+-------+ Ulnar         Full                                          +----------+------------+---------+-----------+----------+-------+ Cephalic      None                                   Acute  +----------+------------+---------+-----------+----------+-------+ Basilic       Full                                          +----------+------------+---------+-----------+----------+-------+ Superficial thrombus located in the cephalic vein is noted to only be in the mid forearm.  Left Findings: +----------+------------+---------+-----------+----------+-------+ LEFT      CompressiblePhasicitySpontaneousPropertiesSummary +----------+------------+---------+-----------+----------+-------+ Subclavian    Full       Yes       Yes                      +----------+------------+---------+-----------+----------+-------+  Summary:  Right: No evidence of deep vein thrombosis in the upper extremity.  Left: No evidence of thrombosis in the subclavian. Findings consistent with acute superficial vein thrombosis involving the right cephalic vein.  *See table(s) above for measurements and observations.  Diagnosing physician: Monica Martinez MD Electronically signed by Monica Martinez MD on 03/02/2021 at 3:00:02 PM.    Final         Scheduled Meds:  (feeding supplement) PROSource Plus  30 mL Oral BID BM   sodium chloride   Intravenous Once   sodium chloride   Intravenous Once   buPROPion  150 mg Oral Daily   Chlorhexidine Gluconate Cloth  6 each Topical Daily   feeding supplement  237 mL Oral Q24H   gabapentin  100 mg Oral TID   insulin aspart  0-15 Units Subcutaneous TID WC   insulin aspart  0-5 Units Subcutaneous QHS   insulin glargine-yfgn  12 Units Subcutaneous Daily   mouth rinse  15 mL Mouth Rinse BID   metoprolol tartrate  50 mg Oral BID   multivitamins with iron  1 tablet Oral Daily   niacin  500 mg Oral Daily    pantoprazole  40 mg Oral BID   [START ON 03/04/2021] phosphorus  250 mg Oral BID   potassium chloride  40 mEq Oral Q4H   rOPINIRole  1 mg Oral QHS   rosuvastatin  5 mg Oral QPM   sucralfate  1 g Oral TID WC & HS   tamsulosin  0.4 mg Oral QPM   thiamine  100 mg Oral Daily   Continuous Infusions:  sodium chloride Stopped (03/02/21 1646)   sodium chloride Stopped (02/28/21 1156)   amiodarone 30 mg/hr (03/03/21 0629)   magnesium sulfate bolus IVPB     potassium PHOSPHATE IVPB (in mmol)     promethazine (PHENERGAN) injection (IM or IVPB) Stopped (03/02/21 2052)   sodium chloride irrigation Stopped (02/28/21 1908)     LOS: 8 days    Time spent: 40 minutes    Irine Seal, MD [facility]   To contact the attending provider between 7A-7P or the covering provider during after hours 7P-7A, please log into the web site [email or web address] and access using universal [place] password for that web site. If you do not have the password, please call the hospital operator.  03/03/2021, 9:23 AM

## 2021-03-03 NOTE — Evaluation (Signed)
Occupational Therapy Evaluation Patient Details Name: Kristopher [name] MRN: [id number] DOB: 1951-05-06 Today's Date: 03/03/2021   History of Present Illness Patient is a 70 year old male presenting with fevers, chills and hematuria. Patient admitted with severe sepsis due to UTI, AMS, and A-fib with RVR. PMH includes atrial fibrillation, coronary artery disease, nonalcoholic cirrhosis, dyslipidemia, type 2 diabetes mellitus, and urolithiasis   Clinical Impression   Patient lives at home with spouse and is independent at baseline with self care tasks, does not use AD. Does report he has vasovagal syndrome "I get 5-10 second warning before I go down." Currently patient min A x2 for safety with functional transfers and ambulation needing at least unilateral upper extremity support due to unsteadiness and global stiffness from prolonged time in bed. Patient also needing total A to don socks due to limited LE ROM/stiffness. Recommend acute OT to maximize patient safety and independence with self care in order to facilitate D/C to venue listed below. Currently would recommend initial 24/7 supervision, will update pending progress.      Recommendations for follow up therapy are one component of a multi-disciplinary discharge planning process, led by the attending physician.  Recommendations may be updated based on patient status, additional functional criteria and insurance authorization.   Follow Up Recommendations  Supervision/Assistance - 24 hour;Other (comment) (initial 24/7 support)    Equipment Recommendations  Tub/shower seat;Other (comment) (rolling walker; pending progress)       Precautions / Restrictions Precautions Precautions: Fall Precaution Comments: patient reports has vasovagal syndrome and gets "5-10 second warning before I go down" Restrictions Weight Bearing Restrictions: No      Mobility Bed Mobility Overal bed mobility: Needs Assistance Bed Mobility: Supine to Sit      Supine to sit: Min assist;HOB elevated     General bed mobility comments: for trunk support to sit upright    Transfers Overall transfer level: Needs assistance Equipment used: 2 person hand held assist Transfers: Sit to/from Stand Sit to Stand: Min assist;+2 safety/equipment         General transfer comment: min A x2 for safety as patient reports global stiffness from immobility, taking small steps initially with moderate improvement with continued ambulation in room.    Balance Overall balance assessment: Needs assistance Sitting-balance support: Feet supported Sitting balance-Leahy Scale: Fair     Standing balance support: Single extremity supported Standing balance-Leahy Scale: Poor Standing balance comment: reliant on at least unilateral upper extremity support                           ADL either performed or assessed with clinical judgement   ADL Overall ADL's : Needs assistance/impaired Eating/Feeding: Independent;Sitting   Grooming: Set up;Sitting   Upper Body Bathing: Set up;Sitting   Lower Body Bathing: Moderate assistance;Sit to/from stand;Sitting/lateral leans   Upper Body Dressing : Set up;Sitting   Lower Body Dressing: Total assistance;Sitting/lateral leans Lower Body Dressing Details (indicate cue type and reason): attempts to don socks however due to global stiffness unable to complete Toilet Transfer: Minimal assistance;+2 for physical assistance;+2 for safety/equipment;Ambulation Toilet Transfer Details (indicate cue type and reason): patient did not use AD, hand held assist to ambulate in room initially taking small steps with slow gait speed due to stiff. improved moderately with increased ambulation Toileting- Clothing Manipulation and Hygiene: Minimal assistance;Sitting/lateral lean;Sit to/from stand       Functional mobility during ADLs: Minimal assistance;+2 for physical assistance;+2 for safety/equipment  Pertinent  Vitals/Pain Pain Assessment: Faces Faces Pain Scale: Hurts little more Pain Location: generalized stiffness Pain Descriptors / Indicators: Other (Comment) (stiff) Pain Intervention(s): Monitored during session     Hand Dominance Right   Extremity/Trunk Assessment Upper Extremity Assessment Upper Extremity Assessment: Generalized weakness   Lower Extremity Assessment Lower Extremity Assessment: Defer to PT evaluation       Communication Communication Communication: No difficulties   Cognition Arousal/Alertness: Awake/alert Behavior During Therapy: WFL for tasks assessed/performed Overall Cognitive Status: Within Functional Limits for tasks assessed                                 General Comments: following directions appropriately              Home Living Family/patient expects to be discharged to:: Private residence Living Arrangements: Spouse/significant other Available Help at Discharge: Family;Available PRN/intermittently Type of Home: House Home Access: Stairs to enter [employer] of Steps: 3   Home Layout: One level     Bathroom Shower/Tub: Occupational psychologist: Standard     Home Equipment: None          Prior Functioning/Environment Level of Independence: Independent                 OT Problem List: Decreased activity tolerance;Impaired balance (sitting and/or standing);Decreased safety awareness      OT Treatment/Interventions: Self-care/ADL training;Balance training;Patient/family education;Therapeutic activities    OT Goals(Current goals can be found in the care plan section) Acute Rehab OT Goals Patient Stated Goal: get moving again OT Goal Formulation: With patient Time For Goal Achievement: 03/17/21 Potential to Achieve Goals: Good  OT Frequency: Min 2X/week               Co-evaluation PT/OT/SLP Co-Evaluation/Treatment: Yes Reason for Co-Treatment: To address functional/ADL  transfers;For patient/therapist safety PT goals addressed during session: Mobility/safety with mobility OT goals addressed during session: ADL's and self-care      AM-PAC OT "6 Clicks" Daily Activity     Outcome Measure Help from another person eating meals?: None Help from another person taking care of personal grooming?: A Little Help from another person toileting, which includes using toliet, bedpan, or urinal?: A Little Help from another person bathing (including washing, rinsing, drying)?: A Lot Help from another person to put on and taking off regular upper body clothing?: A Little Help from another person to put on and taking off regular lower body clothing?: Total 6 Click Score: 16   End of Session Equipment Utilized During Treatment: Gait belt Nurse Communication: Mobility status  Activity Tolerance: Patient tolerated treatment well Patient left: in chair;with call bell/phone within reach;with chair alarm set  OT Visit Diagnosis: Unsteadiness on feet (R26.81);Other abnormalities of gait and mobility (R26.89)                Time: 9373-4287 OT Time Calculation (min): 15 min Charges:  OT General Charges $OT Visit: 1 Visit OT Evaluation $OT Eval Low Complexity: [place] OT OT pager: [place] 03/03/2021, 1:40 PM

## 2021-03-03 NOTE — Progress Notes (Signed)
Subjective:  Feels he is on too many IVs  Antibiotics:  Anti-infectives (From admission, onward)    Start     Dose/Rate Route Frequency Ordered Stop   02/24/21 1000  meropenem (MERREM) 1 g in sodium chloride 0.9 % 100 mL IVPB        1 g 200 mL/hr over 30 Minutes Intravenous Every 12 hours 02/24/21 0957 03/02/21 2304   02/24/21 0600  ceFEPIme (MAXIPIME) 2 g in sodium chloride 0.9 % 100 mL IVPB  Status:  Discontinued        2 g 200 mL/hr over 30 Minutes Intravenous Every 12 hours 02/23/21 2124 02/24/21 0957   02/23/21 2130  ceFEPIme (MAXIPIME) 2 g in sodium chloride 0.9 % 100 mL IVPB  Status:  Discontinued        2 g 200 mL/hr over 30 Minutes Intravenous  Once 02/23/21 2036 02/23/21 2037   02/23/21 1630  ceFEPIme (MAXIPIME) 2 g in sodium chloride 0.9 % 100 mL IVPB        2 g 200 mL/hr over 30 Minutes Intravenous  Once 02/23/21 1620 02/23/21 1706       Medications: Scheduled Meds:  (feeding supplement) PROSource Plus  30 mL Oral BID BM   sodium chloride   Intravenous Once   sodium chloride   Intravenous Once   buPROPion  150 mg Oral Daily   Chlorhexidine Gluconate Cloth  6 each Topical Daily   diphenhydrAMINE  25 mg Oral Once   feeding supplement  237 mL Oral Q24H   gabapentin  100 mg Oral TID   insulin aspart  0-15 Units Subcutaneous TID WC   insulin aspart  0-5 Units Subcutaneous QHS   insulin glargine-yfgn  12 Units Subcutaneous Daily   mouth rinse  15 mL Mouth Rinse BID   metoprolol tartrate  50 mg Oral BID   multivitamins with iron  1 tablet Oral Daily   pantoprazole  40 mg Oral BID   [START ON 03/04/2021] phosphorus  250 mg Oral BID   rOPINIRole  1 mg Oral QHS   rosuvastatin  5 mg Oral QPM   sucralfate  1 g Oral TID WC & HS   tamsulosin  0.4 mg Oral QPM   thiamine  100 mg Oral Daily   Continuous Infusions:  sodium chloride Stopped (03/02/21 1646)   sodium chloride Stopped (03/03/21 1308)   amiodarone 30 mg/hr (03/03/21 1400)   potassium PHOSPHATE  IVPB (in mmol) 30 mmol (03/03/21 1526)   promethazine (PHENERGAN) injection (IM or IVPB) Stopped (03/02/21 2052)   sodium chloride irrigation Stopped (02/28/21 1908)   PRN Meds:.sodium chloride, sodium chloride, baclofen, diphenhydrAMINE, metoprolol tartrate, morphine injection, nitroGLYCERIN, ondansetron (ZOFRAN) IV, promethazine (PHENERGAN) injection (IM or IVPB), traMADol    Objective: Weight change:   Intake/Output Summary (Last 24 hours) at 03/03/2021 1550 Last data filed at 03/03/2021 1434 Gross per 24 hour  Intake 1038.55 ml  Output 4100 ml  Net -3061.45 ml   Blood pressure (!) 152/78, pulse 72, temperature 98.5 F (36.9 C), temperature source Oral, resp. rate 16, height 5' 7"  (1.702 m), weight 77.2 kg, SpO2 100 %. Temp:  [98.4 F (36.9 C)-99 F (37.2 C)] 98.5 F (36.9 C) (09/15 1200) Pulse Rate:  [72-96] 72 (09/15 1300) Resp:  [16-29] 16 (09/15 1300) BP: (115-166)/(58-93) 152/78 (09/15 1127) SpO2:  [92 %-100 %] 100 % (09/15 1300)  Physical Exam: Physical Exam Constitutional:      Appearance: He is well-developed.  HENT:     Head: Normocephalic and atraumatic.  Eyes:     Conjunctiva/sclera: Conjunctivae normal.  Cardiovascular:     Rate and Rhythm: Normal rate and regular rhythm.  Pulmonary:     Effort: Pulmonary effort is normal. No respiratory distress.     Breath sounds: Normal breath sounds. No stridor. No wheezing.  Abdominal:     General: There is no distension.     Palpations: Abdomen is soft.  Musculoskeletal:        General: Normal range of motion.     Cervical back: Normal range of motion and neck supple.  Skin:    General: Skin is warm and dry.     Findings: No erythema or rash.  Neurological:     General: No focal deficit present.     Mental Status: He is alert and oriented to person, place, and time.  Psychiatric:        Mood and Affect: Mood normal.        Behavior: Behavior normal.        Thought Content: Thought content normal.         Judgment: Judgment normal.    Right forearm swollent  CBC:    BMET Recent Labs    03/02/21 0236 03/02/21 1338 03/03/21 0238  NA 133*  --  135  K 2.8* 3.3* 3.0*  CL 92*  --  97*  CO2 28  --  29  GLUCOSE 164*  --  105*  BUN 25*  --  16  CREATININE 1.12  --  0.96  CALCIUM 7.6*  --  7.4*     Liver Panel  Recent Labs    03/03/21 0238  ALBUMIN 2.2*       Sedimentation Rate No results for input(s): ESRSEDRATE in the last 72 hours. C-Reactive Protein No results for input(s): CRP in the last 72 hours.  Micro Results: Recent Results (from the past 720 hour(s))  SARS Coronavirus 2 (TAT 6-24 hrs)     Status: None   Collection Time: 02/10/21 12:00 AM  Result Value Ref Range Status   SARS Coronavirus 2 RESULT: NEGATIVE  Final    Comment: RESULT: NEGATIVESARS-CoV-2 INTERPRETATION:A NEGATIVE  test result means that SARS-CoV-2 RNA was not present in the specimen above the limit of detection of this test. This does not preclude a possible SARS-CoV-2 infection and should not be used as the  sole basis for patient management decisions. Negative results must be combined with clinical observations, patient history, and epidemiological information. Optimum specimen types and timing for peak viral levels during infections caused by SARS-CoV-2  have not been determined. Collection of multiple specimens or types of specimens may be necessary to detect virus. Improper specimen collection and handling, sequence variability under primers/probes, or organism present below the limit of detection may  lead to false negative results. Positive and negative predictive values of testing are highly dependent on prevalence. False negative test results are more likely when prevalence of disease is high.The expected result is NEGATIVE.Fact S heet for  Healthcare Providers: LocalChronicle.no Sheet for Patients: [email or web address].es Reference Range -  Negative   Urine Culture     Status: Abnormal   Collection Time: 02/19/21 11:59 PM   Specimen: Urine, Clean Catch  Result Value Ref Range Status   Specimen Description   Final    URINE, CLEAN CATCH Performed at [facility], [place] [address] [place], [place] [address]    Special Requests   Final  NONE Performed at [facility], [place] [address] [place], [place] [address]    Culture MULTIPLE SPECIES PRESENT, SUGGEST RECOLLECTION (A)  Final   Report Status 02/21/2021 FINAL  Final  Blood Culture (routine x 2)     Status: Abnormal   Collection Time: 02/23/21  4:05 PM   Specimen: BLOOD RIGHT FOREARM  Result Value Ref Range Status   Specimen Description   Final    BLOOD RIGHT FOREARM Performed at [place] [address] [place], [place] [address]    Special Requests   Final    BOTTLES DRAWN AEROBIC AND ANAEROBIC Blood Culture adequate volume Performed at [place] [address] [place], [place] [address]    Culture  Setup Time   Final    GRAM NEGATIVE RODS IN BOTH AEROBIC AND ANAEROBIC BOTTLES CRITICAL RESULT CALLED TO, READ BACK BY AND VERIFIED WITH: PHARMD M BELL [id number] AT 907 AM BY CM Performed at [facility], [place] [address], [place], [place] [address]    Culture (A)  Final    KLEBSIELLA PNEUMONIAE Confirmed Extended Spectrum Beta-Lactamase Producer (ESBL).  In bloodstream infections from ESBL organisms, carbapenems are preferred over piperacillin/tazobactam. They are shown to have a lower risk of mortality.    Report Status 02/26/2021 FINAL  Final   Organism ID, Bacteria KLEBSIELLA PNEUMONIAE  Final      Susceptibility   Klebsiella pneumoniae - MIC*    AMPICILLIN >=32 RESISTANT Resistant     CEFAZOLIN >=64 RESISTANT Resistant     CEFEPIME 2 SENSITIVE Sensitive     CEFTAZIDIME RESISTANT Resistant     CEFTRIAXONE >=64 RESISTANT Resistant     CIPROFLOXACIN 2 INTERMEDIATE  Intermediate     GENTAMICIN >=16 RESISTANT Resistant     IMIPENEM <=0.25 SENSITIVE Sensitive     TRIMETH/SULFA >=320 RESISTANT Resistant     AMPICILLIN/SULBACTAM >=32 RESISTANT Resistant     PIP/TAZO 16 SENSITIVE Sensitive     * KLEBSIELLA PNEUMONIAE  Blood Culture ID Panel (Reflexed)     Status: Abnormal (Preliminary result)   Collection Time: 02/23/21  4:05 PM  Result Value Ref Range Status   Enterococcus faecalis NOT DETECTED NOT DETECTED Final   Enterococcus Faecium NOT DETECTED NOT DETECTED Final   Listeria monocytogenes NOT DETECTED NOT DETECTED Final   Staphylococcus species NOT DETECTED NOT DETECTED Final   Staphylococcus aureus (BCID) NOT DETECTED NOT DETECTED Final   Staphylococcus epidermidis NOT DETECTED NOT DETECTED Final   Staphylococcus lugdunensis NOT DETECTED NOT DETECTED Final   Streptococcus species NOT DETECTED NOT DETECTED Final   Streptococcus agalactiae NOT DETECTED NOT DETECTED Final   Streptococcus pneumoniae NOT DETECTED NOT DETECTED Final   Streptococcus pyogenes NOT DETECTED NOT DETECTED Final   A.calcoaceticus-baumannii NOT DETECTED NOT DETECTED Final   Bacteroides fragilis NOT DETECTED NOT DETECTED Final   Enterobacterales PENDING NOT DETECTED Incomplete   Enterobacter cloacae complex NOT DETECTED NOT DETECTED Final   Escherichia coli NOT DETECTED NOT DETECTED Final   Klebsiella aerogenes NOT DETECTED NOT DETECTED Final   Klebsiella oxytoca NOT DETECTED NOT DETECTED Final   Klebsiella pneumoniae DETECTED (A) NOT DETECTED Final    Comment: CRITICAL RESULT CALLED TO, READ BACK BY AND VERIFIED WITH: PHARMD M BELL [id number] AT 907 AM BY CM    Proteus species NOT DETECTED NOT DETECTED Final   Salmonella species NOT DETECTED NOT DETECTED Final   Serratia marcescens NOT DETECTED NOT DETECTED Final   Haemophilus influenzae NOT DETECTED NOT DETECTED  Final   Neisseria meningitidis NOT DETECTED NOT DETECTED Final   Pseudomonas aeruginosa NOT DETECTED NOT DETECTED  Final   Stenotrophomonas maltophilia NOT DETECTED NOT DETECTED Final   Candida albicans NOT DETECTED NOT DETECTED Final   Candida auris NOT DETECTED NOT DETECTED Final   Candida glabrata NOT DETECTED NOT DETECTED Final   Candida krusei NOT DETECTED NOT DETECTED Final   Candida parapsilosis NOT DETECTED NOT DETECTED Final   Candida tropicalis NOT DETECTED NOT DETECTED Final   Cryptococcus neoformans/gattii NOT DETECTED NOT DETECTED Final   CTX-M ESBL DETECTED (A) NOT DETECTED Final    Comment: CRITICAL RESULT CALLED TO, READ BACK BY AND VERIFIED WITH: PHARMD M BELL [id number] AT 907 AM BY CM (NOTE) Extended spectrum beta-lactamase detected. Recommend a carbapenem as initial therapy.      Carbapenem resistance IMP NOT DETECTED NOT DETECTED Final   Carbapenem resistance KPC NOT DETECTED NOT DETECTED Final   Carbapenem resistance NDM NOT DETECTED NOT DETECTED Final   Carbapenem resist OXA 48 LIKE NOT DETECTED NOT DETECTED Final   Carbapenem resistance VIM NOT DETECTED NOT DETECTED Final    Comment: Performed at [facility], [place] [address], [place], [place] [address]  Blood Culture (routine x 2)     Status: Abnormal   Collection Time: 02/23/21  4:24 PM   Specimen: BLOOD  Result Value Ref Range Status   Specimen Description   Final    BLOOD LEFT ANTECUBITAL Performed at [facility] [address] [place], [place] [address]    Special Requests   Final    BOTTLES DRAWN AEROBIC AND ANAEROBIC Blood Culture adequate volume Performed at [facility] [place] [address] [place], [place] [address]    Culture  Setup Time   Final    GRAM NEGATIVE RODS AEROBIC BOTTLE ONLY CRITICAL VALUE NOTED.  VALUE IS CONSISTENT WITH PREVIOUSLY REPORTED AND CALLED VALUE.    Culture (A)  Final    KLEBSIELLA PNEUMONIAE SUSCEPTIBILITIES PERFORMED ON PREVIOUS CULTURE WITHIN THE LAST 5 DAYS. Performed at [facility], [place] [address], [place], [place] [address]     Report Status 02/26/2021 FINAL  Final  Resp Panel by RT-PCR (Flu A&B, Covid) Nasopharyngeal Swab     Status: None   Collection Time: 02/23/21  4:44 PM   Specimen: Nasopharyngeal Swab; Nasopharyngeal(NP) swabs in vial transport medium  Result Value Ref Range Status   SARS Coronavirus 2 by RT PCR NEGATIVE NEGATIVE Final    Comment: (NOTE) SARS-CoV-2 target nucleic acids are NOT DETECTED.  The SARS-CoV-2 RNA is generally detectable in upper respiratory specimens during the acute phase of infection. The lowest concentration of SARS-CoV-2 viral copies this assay can detect is 138 copies/mL. A negative result does not preclude SARS-Cov-2 infection and should not be used as the sole basis for treatment or other patient management decisions. A negative result may occur with  improper specimen collection/handling, submission of specimen other than nasopharyngeal swab, presence of viral mutation(s) within the areas targeted by this assay, and inadequate number of viral copies(<138 copies/mL). A negative result must be combined with clinical observations, patient history, and epidemiological information. The expected result is Negative.  Fact Sheet for Patients:  EntrepreneurPulse.com.au  Fact Sheet for Healthcare Providers:  IncredibleEmployment.be  This test is no t yet approved or cleared by the [place] FDA and  has been authorized for detection and/or diagnosis of SARS-CoV-2 by FDA under an Emergency Use Authorization (EUA). This EUA will remain  in effect (meaning this test can be used) for the duration of the COVID-19 declaration under Section 564(b)(1) of the Act, 21 U.S.C.section 360bbb-3(b)(1), unless the authorization is terminated  or revoked sooner.       Influenza A by PCR NEGATIVE NEGATIVE Final   Influenza B by PCR NEGATIVE NEGATIVE Final    Comment: (NOTE) The Xpert Xpress SARS-CoV-2/FLU/RSV plus assay is intended as an aid in  the diagnosis of influenza from Nasopharyngeal swab specimens and should not be used as a sole basis for treatment. Nasal washings and aspirates are unacceptable for Xpert Xpress SARS-CoV-2/FLU/RSV testing.  Fact Sheet for Patients: EntrepreneurPulse.com.au  Fact Sheet for Healthcare Providers: IncredibleEmployment.be  This test is not yet approved or cleared by the [place] FDA and has been authorized for detection and/or diagnosis of SARS-CoV-2 by FDA under an Emergency Use Authorization (EUA). This EUA will remain in effect (meaning this test can be used) for the duration of the COVID-19 declaration under Section 564(b)(1) of the Act, 21 U.S.C. section 360bbb-3(b)(1), unless the authorization is terminated or revoked.  Performed at [facility], [place] [address], [place], [place] [address]   Urine Culture     Status: Abnormal   Collection Time: 02/23/21  4:54 PM   Specimen: In/Out Cath Urine  Result Value Ref Range Status   Specimen Description   Final    IN/OUT CATH URINE Performed at [facility] [address] [place], [place] [address]    Special Requests   Final    NONE Performed at [facility], [place] [address], [place],  [address]    Culture (A)  Final    20,000 COLONIES/mL KLEBSIELLA PNEUMONIAE Confirmed Extended Spectrum Beta-Lactamase Producer (ESBL).  In bloodstream infections from ESBL organisms, carbapenems are preferred over piperacillin/tazobactam. They are shown to have a lower risk of mortality. 20,000 COLONIES/mL ENTEROCOCCUS FAECALIS    Report Status 02/26/2021 FINAL  Final   Organism ID, Bacteria KLEBSIELLA PNEUMONIAE (A)  Final   Organism ID, Bacteria ENTEROCOCCUS FAECALIS (A)  Final      Susceptibility   Enterococcus faecalis - MIC*    AMPICILLIN <=2 SENSITIVE Sensitive     NITROFURANTOIN <=16 SENSITIVE Sensitive     VANCOMYCIN 2 SENSITIVE  Sensitive     * 20,000 COLONIES/mL ENTEROCOCCUS FAECALIS   Klebsiella pneumoniae - MIC*    AMPICILLIN >=32 RESISTANT Resistant     CEFAZOLIN >=64 RESISTANT Resistant     CEFEPIME >=32 RESISTANT Resistant     CEFTRIAXONE >=64 RESISTANT Resistant     CIPROFLOXACIN 2 INTERMEDIATE Intermediate     GENTAMICIN >=16 RESISTANT Resistant     IMIPENEM <=0.25 SENSITIVE Sensitive     NITROFURANTOIN 128 RESISTANT Resistant     TRIMETH/SULFA >=320 RESISTANT Resistant     AMPICILLIN/SULBACTAM >=32 RESISTANT Resistant     PIP/TAZO 16 SENSITIVE Sensitive     * 20,000 COLONIES/mL KLEBSIELLA PNEUMONIAE  MRSA Next Gen by PCR, Nasal     Status: None   Collection Time: 02/26/21 10:00 PM   Specimen: Nasal Mucosa; Nasal Swab  Result Value Ref Range Status   MRSA by PCR Next Gen NOT DETECTED NOT DETECTED Final    Comment: (NOTE) The GeneXpert MRSA Assay (FDA approved for NASAL specimens only), is one component of a comprehensive MRSA colonization surveillance program. It is not intended to diagnose MRSA infection nor to guide or monitor treatment for MRSA infections. Test performance is not FDA approved in patients less than 2 years  old. Performed at [facility], [place] [address] [place], [place] [address]     Studies/Results: DG Forearm Right  Result Date: 03/02/2021 CLINICAL DATA:  Right forearm pain. EXAM: RIGHT FOREARM - 2 VIEW COMPARISON:  None. FINDINGS: There is no evidence of fracture or other focal bone lesions. Soft tissue swelling of the proximal to mid forearm. Bandage material overlying dorsal and ulnar aspect of the forearm. IMPRESSION: 1. Soft tissue swelling of the proximal to mid forearm. No acute osseous abnormality. Electronically Signed   By: Titus Dubin M.D.   On: 03/02/2021 13:56   VAS [place] UPPER EXTREMITY VENOUS DUPLEX  Result Date: 03/02/2021 UPPER VENOUS STUDY  Patient Name:  Kristopher [name]  Date of Exam:   03/02/2021 Medical Rec #: [id number]     Accession #:     [id number] Date of Birth: 12/10/1950      Patient Gender: M Patient Age:   89 years Exam Location:  [facility] Procedure:      VAS [place] UPPER EXTREMITY VENOUS DUPLEX Referring Phys: Lonnell Chaput VAN DAM --------------------------------------------------------------------------------  Indications: Pain Risk Factors: Trauma. Comparison Study: No prior studies. Performing Technologist: Oliver Hum RVT  Examination Guidelines: A complete evaluation includes B-mode imaging, spectral Doppler, color Doppler, and power Doppler as needed of all accessible portions of each vessel. Bilateral testing is considered an integral part of a complete examination. Limited examinations for reoccurring indications may be performed as noted.  Right Findings: +----------+------------+---------+-----------+----------+-------+ RIGHT     CompressiblePhasicitySpontaneousPropertiesSummary +----------+------------+---------+-----------+----------+-------+ IJV           Full       Yes       Yes                      +----------+------------+---------+-----------+----------+-------+ Subclavian    Full       Yes       Yes                      +----------+------------+---------+-----------+----------+-------+ Axillary      Full       Yes       Yes                      +----------+------------+---------+-----------+----------+-------+ Brachial      Full       Yes       Yes                      +----------+------------+---------+-----------+----------+-------+ Radial        Full                                          +----------+------------+---------+-----------+----------+-------+ Ulnar         Full                                          +----------+------------+---------+-----------+----------+-------+ Cephalic      None                                   Acute  +----------+------------+---------+-----------+----------+-------+ Basilic       Full                                           +----------+------------+---------+-----------+----------+-------+  Superficial thrombus located in the cephalic vein is noted to only be in the mid forearm.  Left Findings: +----------+------------+---------+-----------+----------+-------+ LEFT      CompressiblePhasicitySpontaneousPropertiesSummary +----------+------------+---------+-----------+----------+-------+ Subclavian    Full       Yes       Yes                      +----------+------------+---------+-----------+----------+-------+  Summary:  Right: No evidence of deep vein thrombosis in the upper extremity.  Left: No evidence of thrombosis in the subclavian. Findings consistent with acute superficial vein thrombosis involving the right cephalic vein.  *See table(s) above for measurements and observations.  Diagnosing physician: Monica Martinez MD Electronically signed by Monica Martinez MD on 03/02/2021 at 3:00:02 PM.    Final       Assessment/Plan:  INTERVAL HISTORY:  afebrile off of antibiotics   Principal Problem:   Severe Sepsis secondary to UTI [facility]) Active Problems:   Coronary artery disease involving native coronary artery of native heart without angina pectoris   Atrial fibrillation s/p PVI ablation at [facility] 2001   Obstructive sleep apnea syndrome   Cirrhosis ([place])   Type 2 diabetes mellitus without complications ([place])   AKI (acute kidney injury) ([place])   Anemia   Thrombocytopenia ([place])   Sepsis with acute renal failure without septic shock (HCC)   Hematuria   Dyspnea   Atrial fibrillation with RVR (HCC)   ESBL (extended spectrum beta-lactamase) producing bacteria infection   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 male  with recent cystoscopy in August 26 with nephrolithotomy of large kidney stone and left ureteral  stent placement, complicated by urinary retention need for Foley catheter placement which then became plugged and then needed to be replaced then admitted with complicated urinary tract infection with ESBL Klebsiella pneumonia and Enterococcus in urine and Klebsiella pneumonia, ESBL in bloodstream.  His course was also been complicated by hypoxia and atrial fibrillation with rapid ventricular response.  He now has been found to have basilic vein thrombus  #1  Complicated urinary tract infection with ESBL Klebsiella and Enterococcus with the former having seeded blood:  He has completed 7 days of meropenem and is doing well off antibiotic therapy  #2  Right basilic vein thrombus: Due to infiltration at his IV site with amiodarone: Apply warm compresses monitor for evidence for progression to DVT  #3 atrial fibrillation currently on amiodarone drip back in 9 normal sinus rhythm     LOS: 8 days   Alcide Evener 03/03/2021, 3:50 PM

## 2021-03-03 NOTE — Progress Notes (Signed)
Progress Note  Patient Name: Kristopher [name] Date of Encounter: 03/03/2021  [facility] Cardiologist: [facility]  Subjective   Denies any chest pain or SOB today.  Remains in NSR on tele on IV Amio  Inpatient Medications    Scheduled Meds:  (feeding supplement) PROSource Plus  30 mL Oral BID BM   sodium chloride   Intravenous Once   sodium chloride   Intravenous Once   buPROPion  150 mg Oral Daily   Chlorhexidine Gluconate Cloth  6 each Topical Daily   feeding supplement  237 mL Oral Q24H   gabapentin  100 mg Oral TID   insulin aspart  0-15 Units Subcutaneous TID WC   insulin aspart  0-5 Units Subcutaneous QHS   insulin glargine-yfgn  12 Units Subcutaneous Daily   mouth rinse  15 mL Mouth Rinse BID   metoprolol tartrate  50 mg Oral BID   multivitamins with iron  1 tablet Oral Daily   niacin  500 mg Oral Daily   pantoprazole  40 mg Oral BID   [START ON 03/04/2021] phosphorus  250 mg Oral BID   potassium chloride  40 mEq Oral Q4H   rOPINIRole  1 mg Oral QHS   rosuvastatin  5 mg Oral QPM   sucralfate  1 g Oral TID WC & HS   tamsulosin  0.4 mg Oral QPM   thiamine  100 mg Oral Daily   Continuous Infusions:  sodium chloride Stopped (03/02/21 1646)   sodium chloride Stopped (02/28/21 1156)   amiodarone 30 mg/hr (03/03/21 0629)   magnesium sulfate bolus IVPB     potassium PHOSPHATE IVPB (in mmol)     promethazine (PHENERGAN) injection (IM or IVPB) Stopped (03/02/21 2052)   sodium chloride irrigation Stopped (02/28/21 1908)   PRN Meds: sodium chloride, sodium chloride, baclofen, diphenhydrAMINE, metoprolol tartrate, morphine injection, nitroGLYCERIN, ondansetron (ZOFRAN) IV, promethazine (PHENERGAN) injection (IM or IVPB), traMADol   Vital Signs    Vitals:   03/03/21 0404 03/03/21 0500 03/03/21 0600 03/03/21 0800  BP:  (!) 159/87 (!) 152/76   Pulse:  86 85   Resp:  (!) 21 16   Temp: 98.4 F (36.9 C)   98.9 F (37.2 C)  TempSrc: Axillary   Oral  SpO2:  97% 94%    Weight:      Height:        Intake/Output Summary (Last 24 hours) at 03/03/2021 1108 Last data filed at 03/03/2021 0629 Gross per 24 hour  Intake 951.64 ml  Output 3025 ml  Net -2073.36 ml    Last 3 Weights 03/02/2021 03/01/2021 02/23/2021  Weight (lbs) 170 lb 3.1 oz 172 lb 13.5 oz 159 lb 13.3 oz  Weight (kg) 77.2 kg 78.4 kg 72.5 kg      Telemetry    NSR - Personally Reviewed  ECG    No new ECG tracing today. - Personally Reviewed  Physical Exam   GEN: Well nourished, well developed in no acute distress HEENT: Normal NECK: No JVD; No carotid bruits LYMPHATICS: No lymphadenopathy CARDIAC:RRR, no murmurs, rubs, gallops RESPIRATORY:  Clear to auscultation without rales, wheezing or rhonchi  ABDOMEN: Soft, non-tender, non-distended MUSCULOSKELETAL:  No edema; No deformity  SKIN: Warm and dry NEUROLOGIC:  Alert and oriented x 3 PSYCHIATRIC:  Normal affect   Labs    High Sensitivity Troponin:   Recent Labs  Lab 02/23/21 1605 02/23/21 1820 02/28/21 0537  TROPONINIHS 39* 38* 11       Chemistry Recent Labs  Lab 02/27/21 1658 02/28/21 0254 03/01/21 0219 03/02/21 0236 03/02/21 1338 03/03/21 0238  NA  --    < > 132* 133*  --  135  K  --    < > 2.9* 2.8* 3.3* 3.0*  CL  --    < > 95* 92*  --  97*  CO2  --    < > 24 28  --  29  GLUCOSE  --    < > 254* 164*  --  105*  BUN  --    < > 30* 25*  --  16  CREATININE  --    < > 1.50* 1.12  --  0.96  CALCIUM  --    < > 7.8* 7.6*  --  7.4*  PROT 5.8*  --   --   --   --   --   ALBUMIN 2.3*  --   --   --   --  2.2*  AST 38  --   --   --   --   --   ALT 30  --   --   --   --   --   ALKPHOS 66  --   --   --   --   --   BILITOT 1.2  --   --   --   --   --   GFRNONAA  --    < > 50* >60  --  >60  ANIONGAP  --    < > 13 13  --  9   < > = values in this interval not displayed.      Hematology Recent Labs  Lab 03/01/21 0219 03/02/21 0236 03/03/21 0238  WBC 9.2 8.6 9.7  RBC 3.10* 3.23* 3.27*  HGB 9.6* 9.8* 9.7*  HCT  27.2* 28.2* 29.2*  MCV 87.7 87.3 89.3  MCH 31.0 30.3 29.7  MCHC 35.3 34.8 33.2  RDW 16.6* 16.3* 16.0*  PLT 189 221 253     BNP Recent Labs  Lab 02/28/21 1017  BNP 472.6*      DDimer No results for input(s): DDIMER in the last 168 hours.   Radiology    DG Forearm Right  Result Date: 03/02/2021 CLINICAL DATA:  Right forearm pain. EXAM: RIGHT FOREARM - 2 VIEW COMPARISON:  None. FINDINGS: There is no evidence of fracture or other focal bone lesions. Soft tissue swelling of the proximal to mid forearm. Bandage material overlying dorsal and ulnar aspect of the forearm. IMPRESSION: 1. Soft tissue swelling of the proximal to mid forearm. No acute osseous abnormality. Electronically Signed   By: Titus Dubin M.D.   On: 03/02/2021 13:56   VAS [place] UPPER EXTREMITY VENOUS DUPLEX  Result Date: 03/02/2021 UPPER VENOUS STUDY  Patient Name:  Kristopher [name]  Date of Exam:   03/02/2021 Medical Rec #: [id number]     Accession #:    [id number] Date of Birth: 06-Dec-1950      Patient Gender: M Patient Age:   49 years Exam Location:  [facility] Procedure:      VAS [place] UPPER EXTREMITY VENOUS DUPLEX Referring Phys: CORNELIUS VAN DAM --------------------------------------------------------------------------------  Indications: Pain Risk Factors: Trauma. Comparison Study: No prior studies. Performing Technologist: Oliver Hum RVT  Examination Guidelines: A complete evaluation includes B-mode imaging, spectral Doppler, color Doppler, and power Doppler as needed of all accessible portions of each vessel. Bilateral testing is considered an integral part of a complete examination. Limited examinations for reoccurring indications  may be performed as noted.  Right Findings: +----------+------------+---------+-----------+----------+-------+ RIGHT     CompressiblePhasicitySpontaneousPropertiesSummary +----------+------------+---------+-----------+----------+-------+ IJV           Full       Yes       Yes                       +----------+------------+---------+-----------+----------+-------+ Subclavian    Full       Yes       Yes                      +----------+------------+---------+-----------+----------+-------+ Axillary      Full       Yes       Yes                      +----------+------------+---------+-----------+----------+-------+ Brachial      Full       Yes       Yes                      +----------+------------+---------+-----------+----------+-------+ Radial        Full                                          +----------+------------+---------+-----------+----------+-------+ Ulnar         Full                                          +----------+------------+---------+-----------+----------+-------+ Cephalic      None                                   Acute  +----------+------------+---------+-----------+----------+-------+ Basilic       Full                                          +----------+------------+---------+-----------+----------+-------+ Superficial thrombus located in the cephalic vein is noted to only be in the mid forearm.  Left Findings: +----------+------------+---------+-----------+----------+-------+ LEFT      CompressiblePhasicitySpontaneousPropertiesSummary +----------+------------+---------+-----------+----------+-------+ Subclavian    Full       Yes       Yes                      +----------+------------+---------+-----------+----------+-------+  Summary:  Right: No evidence of deep vein thrombosis in the upper extremity.  Left: No evidence of thrombosis in the subclavian. Findings consistent with acute superficial vein thrombosis involving the right cephalic vein.  *See table(s) above for measurements and observations.  Diagnosing physician: Monica Martinez MD Electronically signed by Monica Martinez MD on 03/02/2021 at 3:00:02 PM.    Final     Cardiac Studies   Echocardiogram 02/28/2021: Impressions: 1.  Left ventricular ejection fraction, by estimation, is 50 to 55%. The  left ventricle has low normal function. The left ventricle demonstrates  regional wall motion abnormalities (see scoring diagram/findings for  description). There is mild left  ventricular hypertrophy. Left ventricular diastolic parameters are  consistent with Grade II diastolic dysfunction (pseudonormalization).  Elevated left ventricular end-diastolic pressure. There is moderate  hypokinesis of  the left ventricular, basal  inferior wall and inferoseptal wall.   2. Right ventricular systolic function is low normal. The right  ventricular size is normal.   3. Effusion measures up to 1.17 cm posterior to the LV. a small  pericardial effusion is present. The pericardial effusion is posterior to  the left ventricle. There is no evidence of cardiac tamponade.   4. The mitral valve is abnormal. Mild mitral valve regurgitation.   5. The aortic valve is tricuspid. Aortic valve regurgitation is not  visualized. No aortic stenosis is present.   6. The inferior vena cava is dilated in size with <50% respiratory  variability, suggesting right atrial pressure of 15 mmHg.   Comparison(s): Changes from prior study are noted. 09/19/2019: LVEF 55-60%,  moderate pericardial effusion, no tamponade.   Patient Profile     70 y.o. male with a history of CAD with NSTEMI in 06/2013 s/p DES to RCA, ischemic cardiomyopathy with EF as low as 40% in 2013 but improved to 55-60% in 09/2019, paroxysmal atrial fibrillation/flutter s/p PVI ablation x2 in 11/1998 at [facility], pericarditis in 2001 with chronic pericardial effusion, syncope presumed to be vasovagal s/p loop recorder, hyperlipidemia, type 2 diabetes mellitus, obstructive sleep apnea on CPAP, nonalcoholic cirrhosis of liver, who is being seen 02/28/2021 for the evaluation of atrial fibrillation at the request of Dr. Cathlean Sauer.    Assessment & Plan    Paroxysmal Atrial Fibrillation/Flutter - Patient  has a long history of atrial fibrillation/flutter. S/p ablation x2 in 2000/2001 at [facility]. No recurrence since then and not on anticoagulation at home. Patient was admitted in sepsis secondary to UTI and has had recurrent paroxysmal atrial fibrillation intermittently throughout admission. Started on IV Cardizem and then IV Amiodarone and converted back to sinus rhythm.  - Maintaining sinus rhythm. Rates currently in the 80s. - Potassium 3.0 this am and Mag 1.5 >>replete to keep at >4.0  and >2.0, respectively. Being repleted by primary team.  - TSH normal. - Echo showed LVEF of 50-55%.  - IV Cardizem stopped on 03/01/2021.  - Continue IV Amiodarone while here but will plan to stop prior to discharge. Will not discharge him on this given underlying liver cirrhosis. - Continue Lopressor 43m twice daily. - he was only taking Cardizem every 36 hours PTA - CHA2DS2-VASc = 5 (CAD, DM, age, prior infarct noted on MRI). Has not been started on any anticoagulation due to hematuria and anemia. Consider outpatient monitor to evaluate for recurrent atrial fibrillation and to help guide anticoagulation recommendations. [facility]  Acute Hypoxic Respiratory Failure Possible Acute Diastolic CHF - Chest-ray on 02/26/2021 showed bilateral patchy airspace opacity in the lungs (right > left) concerning for bilateral multilobar pneumonia. There was also concern for possible ARDS or aspiration pneumonitis/pneumonia. Repeat chest x-ray on today showed persistent but improved airspace opacities (right > left). - BNP 472. - Has received IV Lasix the last 4 days.  Documented urine output of 3.025L yesterday. Net negative 14.9 L since admission. Weight 170 lbs yesterday, down 2 lbs from yesterday  - SCr continues to improve not down to 0.96 - Echo showed LVEF of 50-55%  - Does not appear significantly follow-up overloaded on exam.   Elevated Troponin History of CAD - S/p DES to RCA in  2015.  - High-sensitivity troponin minimally elevated and flat at 39 >> 38 on 9/7 and then 11 on 9/12.  - Echo report did report hypokinesis of the basal inferior  wall and inferoseptal wall but I personally reviewed images and felt wall motion was normal in those areas - No chest pain. - Has been  maintained on DAPT with Aspirin and Plavix since 2015. Both on hold due to anemia. Can restart when OK with primary team. - Continue beta-blocker and statin. - Troponin elevation not consistent with ACS. Consistent with demand in setting of acute illness. No ischemic work-up necessary at this time.   History of Pericarditis with Chronic Pericardial Effusion - Moderate effusion noted on last Echo in 09/2019. - Small effusion noted on Echo this admission.   Acute Blood Loss Anemia Secondary to Hematuria - Hemoglobin 6.8 on admission. S/p 2 units of PRBC. Slowly improving and stable at 9.7 today. - Treated with CBI. - Management per Urology and primary team.   AKI - Creatinine 2.11 on admission. Improved to 0.96 today. Baseline 0.835 to 1.2. - Continue to avoid Nephrotoxic agents.  - Continue to follow closely.   Hypokalemia - Potassium 3.0 today. Goal >4.0 given atrial fibrillation. - Being repleted by primary team. Also need to replete Mag - Continue to monitor.    Hypomagnesemia - Magnesium 1.5  today Goal >2.0 given atrial fibrillation. - Being repleted by primary team.  - Continue to monitor.   Otherwise, per primary team: - Severe sepsis secondary to UTI - New metabolic encephalopathy - Acute gastritis - Dyslipidemia - Type 2 diabetes mellitus   I have spent a total of 35 minutes with patient reviewing 2D echo , telemetry, EKGs, labs and examining patient as well as establishing an assessment and plan that was discussed with the patient.  > 50% of time was spent in direct patient care.       For questions or updates, please contact [place] Please consult [email or web address] for  contact info under        Signed, Fransico Him, MD  03/03/2021, 11:08 AM

## 2021-03-04 DIAGNOSIS — I4891 Unspecified atrial fibrillation: Secondary | ICD-10-CM | POA: Diagnosis not present

## 2021-03-04 DIAGNOSIS — I10 Essential (primary) hypertension: Secondary | ICD-10-CM | POA: Diagnosis not present

## 2021-03-04 DIAGNOSIS — E876 Hypokalemia: Secondary | ICD-10-CM | POA: Diagnosis not present

## 2021-03-04 DIAGNOSIS — I2583 Coronary atherosclerosis due to lipid rich plaque: Secondary | ICD-10-CM

## 2021-03-04 LAB — CBC WITH DIFFERENTIAL/PLATELET
Abs Immature Granulocytes: 0.12 10*3/uL — ABNORMAL HIGH (ref 0.00–0.07)
Basophils Absolute: 0 10*3/uL (ref 0.0–0.1)
Basophils Relative: 0 %
Eosinophils Absolute: 0.3 10*3/uL (ref 0.0–0.5)
Eosinophils Relative: 3 %
HCT: 28.9 % — ABNORMAL LOW (ref 39.0–52.0)
Hemoglobin: 9.7 g/dL — ABNORMAL LOW (ref 13.0–17.0)
Immature Granulocytes: 1 %
Lymphocytes Relative: 8 %
Lymphs Abs: 0.9 10*3/uL (ref 0.7–4.0)
MCH: 29.8 pg (ref 26.0–34.0)
MCHC: 33.6 g/dL (ref 30.0–36.0)
MCV: 88.9 fL (ref 80.0–100.0)
Monocytes Absolute: 0.7 10*3/uL (ref 0.1–1.0)
Monocytes Relative: 7 %
Neutro Abs: 8.8 10*3/uL — ABNORMAL HIGH (ref 1.7–7.7)
Neutrophils Relative %: 81 %
Platelets: 273 10*3/uL (ref 150–400)
RBC: 3.25 MIL/uL — ABNORMAL LOW (ref 4.22–5.81)
RDW: 15.9 % — ABNORMAL HIGH (ref 11.5–15.5)
WBC: 10.9 10*3/uL — ABNORMAL HIGH (ref 4.0–10.5)
nRBC: 0 % (ref 0.0–0.2)

## 2021-03-04 LAB — RENAL FUNCTION PANEL
Albumin: 2.4 g/dL — ABNORMAL LOW (ref 3.5–5.0)
Anion gap: 9 (ref 5–15)
BUN: 15 mg/dL (ref 8–23)
CO2: 29 mmol/L (ref 22–32)
Calcium: 7.5 mg/dL — ABNORMAL LOW (ref 8.9–10.3)
Chloride: 97 mmol/L — ABNORMAL LOW (ref 98–111)
Creatinine, Ser: 1 mg/dL (ref 0.61–1.24)
GFR, Estimated: >60 mL/min (ref >60)
Glucose, Bld: 161 mg/dL — ABNORMAL HIGH (ref 70–99)
Phosphorus: 2.5 mg/dL (ref 2.5–4.6)
Potassium: 3.5 mmol/L (ref 3.5–5.1)
Sodium: 135 mmol/L (ref 135–145)

## 2021-03-04 LAB — GLUCOSE, CAPILLARY
Glucose-Capillary: 159 mg/dL — ABNORMAL HIGH (ref 70–99)
Glucose-Capillary: 113 mg/dL — ABNORMAL HIGH (ref 70–99)

## 2021-03-04 LAB — MAGNESIUM: Magnesium: 1.8 mg/dL (ref 1.7–2.4)

## 2021-03-04 MED ORDER — METOPROLOL TARTRATE 50 MG PO TABS: 50.0000 mg | ORAL_TABLET | Freq: Two times a day (BID) | ORAL | 1 refills | Status: DC

## 2021-03-04 MED ORDER — SUCRALFATE 1 G PO TABS: 1.0000 g | ORAL_TABLET | Freq: Three times a day (TID) | ORAL | 0 refills | Status: DC

## 2021-03-04 MED ORDER — POTASSIUM CHLORIDE CRYS ER 20 MEQ PO TBCR
40.0000 meq | EXTENDED_RELEASE_TABLET | Freq: Once | ORAL | Status: AC
  Administered 2021-03-04: 40 meq via ORAL
  Filled 2021-03-04: qty 2

## 2021-03-04 MED ORDER — TAB-A-VITE/IRON PO TABS: 1.0000 | ORAL_TABLET | Freq: Every day | ORAL | 0 refills | Status: DC

## 2021-03-04 MED ORDER — K PHOS MONO-SOD PHOS DI & MONO 155-852-130 MG PO TABS: 250.0000 mg | ORAL_TABLET | Freq: Two times a day (BID) | ORAL | 0 refills | Status: AC

## 2021-03-04 MED ORDER — BACLOFEN 5 MG PO TABS: 5.0000 mg | ORAL_TABLET | Freq: Three times a day (TID) | ORAL | 0 refills | Status: DC | PRN

## 2021-03-04 MED ORDER — THIAMINE HCL 100 MG PO TABS: 100.0000 mg | ORAL_TABLET | Freq: Every day | ORAL | 0 refills | Status: DC

## 2021-03-04 MED ORDER — BUPROPION HCL ER (XL) 150 MG PO TB24: 150.0000 mg | ORAL_TABLET | Freq: Every day | ORAL | 0 refills | Status: DC

## 2021-03-04 MED ORDER — GABAPENTIN 100 MG PO CAPS: 100.0000 mg | ORAL_CAPSULE | Freq: Three times a day (TID) | ORAL | 0 refills | Status: DC

## 2021-03-04 MED ORDER — MAGNESIUM SULFATE 2 GM/50ML IV SOLN
2.0000 g | Freq: Once | INTRAVENOUS | Status: AC
  Administered 2021-03-04: 2 g via INTRAVENOUS
  Filled 2021-03-04: qty 50

## 2021-03-04 NOTE — Progress Notes (Signed)
Subjective: Voiding without issue.  Passing old clots. Feels well.  No complaints.  Objective: Vital signs in last 24 hours: Temp:  [98.1 F (36.7 C)-98.8 F (37.1 C)] 98.8 F (37.1 C) (09/16 0719) Pulse Rate:  [72-85] 74 (09/16 1018) Resp:  [15-23] 19 (09/16 0700) BP: (120-140)/(52-73) 127/73 (09/16 1018) SpO2:  [93 %-100 %] 96 % (09/16 1000)  Intake/Output from previous day: 09/15 0701 - 09/16 0700 In: 1036.2 [I.V.:430.7; IV Piggyback:605.5] Out: 2825 [Urine:2825] Intake/Output this shift: Total I/O In: 57.1 [I.V.:7.1; IV Piggyback:50] Out: 275 [Urine:275]  Physical Exam:  General: Alert and oriented Non-labored bvreathing Foley out  Lab Results: Recent Labs    03/02/21 0236 03/03/21 0238 03/04/21 0235  HGB 9.8* 9.7* 9.7*  HCT 28.2* 29.2* 28.9*    BMET Recent Labs    03/03/21 0238 03/04/21 0235  NA 135 135  K 3.0* 3.5  CL 97* 97*  CO2 29 29  GLUCOSE 105* 161*  BUN 16 15  CREATININE 0.96 1.00  CALCIUM 7.4* 7.5*      Studies/Results: VAS [place] UPPER EXTREMITY VENOUS DUPLEX  Result Date: 03/02/2021 UPPER VENOUS STUDY  Patient Name:  COULTON SCHLINK  Date of Exam:   03/02/2021 Medical Rec #: [id number]     Accession #:    [id number] Date of Birth: [birth year]      Patient Gender: M Patient Age:   70 years Exam Location:  [facility] Procedure:      VAS [place] UPPER EXTREMITY VENOUS DUPLEX Referring Phys: CORNELIUS VAN DAM --------------------------------------------------------------------------------  Indications: Pain Risk Factors: Trauma. Comparison Study: No prior studies. Performing Technologist: Oliver Hum RVT  Examination Guidelines: A complete evaluation includes B-mode imaging, spectral Doppler, color Doppler, and power Doppler as needed of all accessible portions of each vessel. Bilateral testing is considered an integral part of a complete examination. Limited examinations for reoccurring indications may be performed as noted.  Right Findings:  +----------+------------+---------+-----------+----------+-------+ RIGHT     CompressiblePhasicitySpontaneousPropertiesSummary +----------+------------+---------+-----------+----------+-------+ IJV           Full       Yes       Yes                      +----------+------------+---------+-----------+----------+-------+ Subclavian    Full       Yes       Yes                      +----------+------------+---------+-----------+----------+-------+ Axillary      Full       Yes       Yes                      +----------+------------+---------+-----------+----------+-------+ Brachial      Full       Yes       Yes                      +----------+------------+---------+-----------+----------+-------+ Radial        Full                                          +----------+------------+---------+-----------+----------+-------+ Ulnar         Full                                          +----------+------------+---------+-----------+----------+-------+  Cephalic      None                                   Acute  +----------+------------+---------+-----------+----------+-------+ Basilic       Full                                          +----------+------------+---------+-----------+----------+-------+ Superficial thrombus located in the cephalic vein is noted to only be in the mid forearm.  Left Findings: +----------+------------+---------+-----------+----------+-------+ LEFT      CompressiblePhasicitySpontaneousPropertiesSummary +----------+------------+---------+-----------+----------+-------+ Subclavian    Full       Yes       Yes                      +----------+------------+---------+-----------+----------+-------+  Summary:  Right: No evidence of deep vein thrombosis in the upper extremity.  Left: No evidence of thrombosis in the subclavian. Findings consistent with acute superficial vein thrombosis involving the right cephalic vein.  *See  table(s) above for measurements and observations.  Diagnosing physician: Monica Martinez MD Electronically signed by Monica Martinez MD on 03/02/2021 at 3:00:02 PM.    Final     Assessment/Plan: Will schedule outpatient f/u next week for stent removal. Okay to start ASA now, will resume plavix once stent is out. No abx upon discharge.      LOS: 9 days   [name] 03/04/2021, 11:52 AM

## 2021-03-04 NOTE — Progress Notes (Signed)
Progress Note  Patient Name: Kristopher [name] Date of Encounter: 03/04/2021  [facility] Cardiologist: [facility]  Maintaining NSR on IV Amio.  Anxious to go home  Inpatient Medications    Scheduled Meds:  (feeding supplement) PROSource Plus  30 mL Oral BID BM   sodium chloride   Intravenous Once   sodium chloride   Intravenous Once   buPROPion  150 mg Oral Daily   Chlorhexidine Gluconate Cloth  6 each Topical Daily   diphenhydrAMINE  25 mg Oral Once   feeding supplement  237 mL Oral Q24H   gabapentin  100 mg Oral TID   insulin aspart  0-15 Units Subcutaneous TID WC   insulin aspart  0-5 Units Subcutaneous QHS   insulin glargine-yfgn  12 Units Subcutaneous Daily   metoprolol tartrate  50 mg Oral BID   multivitamins with iron  1 tablet Oral Daily   pantoprazole  40 mg Oral BID   phosphorus  250 mg Oral BID   rOPINIRole  1 mg Oral QHS   rosuvastatin  5 mg Oral QPM   sucralfate  1 g Oral TID WC & HS   tamsulosin  0.4 mg Oral QPM   thiamine  100 mg Oral Daily   Continuous Infusions:  sodium chloride 10 mL/hr at 03/04/21 0815   sodium chloride Stopped (03/03/21 1520)   amiodarone Stopped (03/04/21 2376)   magnesium sulfate bolus IVPB 2 g (03/04/21 0817)   promethazine (PHENERGAN) injection (IM or IVPB) Stopped (03/02/21 2052)   sodium chloride irrigation Stopped (02/28/21 1908)   PRN Meds: sodium chloride, sodium chloride, baclofen, diphenhydrAMINE, metoprolol tartrate, morphine injection, nitroGLYCERIN, ondansetron (ZOFRAN) IV, promethazine (PHENERGAN) injection (IM or IVPB), traMADol   Vital Signs    Vitals:   03/04/21 0303 03/04/21 0400 03/04/21 0700 03/04/21 0719  BP: 120/71   (!) 121/52  Pulse: 79 77 80 81  Resp: 20 15 19    Temp:  98.3 F (36.8 C)    TempSrc:  Oral    SpO2: 98% 93% 100% 97%  Weight:      Height:        Intake/Output Summary (Last 24 hours) at 03/04/2021 0818 Last data filed at 03/04/2021 0700 Gross per 24 hour  Intake 969.54 ml   Output 2825 ml  Net -1855.46 ml    Last 3 Weights 03/02/2021 03/01/2021 02/23/2021  Weight (lbs) 170 lb 3.1 oz 172 lb 13.5 oz 159 lb 13.3 oz  Weight (kg) 77.2 kg 78.4 kg 72.5 kg      Telemetry    NSR - Personally Reviewed  ECG    No new ECG tracing today. - Personally Reviewed  Physical Exam   GEN: Well nourished, well developed in no acute distress HEENT: Normal NECK: No JVD; No carotid bruits LYMPHATICS: No lymphadenopathy CARDIAC:RRR, no murmurs, rubs, gallops RESPIRATORY:  Clear to auscultation without rales, wheezing or rhonchi  ABDOMEN: Soft, non-tender, non-distended MUSCULOSKELETAL:  No edema; No deformity  SKIN: Warm and dry NEUROLOGIC:  Alert and oriented x 3 PSYCHIATRIC:  Normal affect   Labs    High Sensitivity Troponin:   Recent Labs  Lab 02/23/21 1605 02/23/21 1820 02/28/21 0537  TROPONINIHS 39* 38* 11       Chemistry Recent Labs  Lab 02/27/21 1658 02/28/21 0254 03/02/21 0236 03/02/21 1338 03/03/21 0238 03/04/21 0235  NA  --    < > 133*  --  135 135  K  --    < > 2.8* 3.3* 3.0* 3.5  CL  --    < > 92*  --  97* 97*  CO2  --    < > 28  --  29 29  GLUCOSE  --    < > 164*  --  105* 161*  BUN  --    < > 25*  --  16 15  CREATININE  --    < > 1.12  --  0.96 1.00  CALCIUM  --    < > 7.6*  --  7.4* 7.5*  PROT 5.8*  --   --   --   --   --   ALBUMIN 2.3*  --   --   --  2.2* 2.4*  AST 38  --   --   --   --   --   ALT 30  --   --   --   --   --   ALKPHOS 66  --   --   --   --   --   BILITOT 1.2  --   --   --   --   --   GFRNONAA  --    < > >60  --  >60 >60  ANIONGAP  --    < > 13  --  9 9   < > = values in this interval not displayed.      Hematology Recent Labs  Lab 03/02/21 0236 03/03/21 0238 03/04/21 0235  WBC 8.6 9.7 10.9*  RBC 3.23* 3.27* 3.25*  HGB 9.8* 9.7* 9.7*  HCT 28.2* 29.2* 28.9*  MCV 87.3 89.3 88.9  MCH 30.3 29.7 29.8  MCHC 34.8 33.2 33.6  RDW 16.3* 16.0* 15.9*  PLT 221 253 273     BNP Recent Labs  Lab  02/28/21 1017  BNP 472.6*      DDimer No results for input(s): DDIMER in the last 168 hours.   Radiology    DG Forearm Right  Result Date: 03/02/2021 CLINICAL DATA:  Right forearm pain. EXAM: RIGHT FOREARM - 2 VIEW COMPARISON:  None. FINDINGS: There is no evidence of fracture or other focal bone lesions. Soft tissue swelling of the proximal to mid forearm. Bandage material overlying dorsal and ulnar aspect of the forearm. IMPRESSION: 1. Soft tissue swelling of the proximal to mid forearm. No acute osseous abnormality. Electronically Signed   By: Titus Dubin M.D.   On: 03/02/2021 13:56   VAS [place] UPPER EXTREMITY VENOUS DUPLEX  Result Date: 03/02/2021 UPPER VENOUS STUDY  Patient Name:  Kristopher [name]  Date of Exam:   03/02/2021 Medical Rec #: [id number]     Accession #:    [id number] Date of Birth: Sep 24, 1950      Patient Gender: M Patient Age:   70 years Exam Location:  [facility] Procedure:      VAS [place] UPPER EXTREMITY VENOUS DUPLEX Referring Phys: CORNELIUS VAN DAM --------------------------------------------------------------------------------  Indications: Pain Risk Factors: Trauma. Comparison Study: No prior studies. Performing Technologist: Oliver Hum RVT  Examination Guidelines: A complete evaluation includes B-mode imaging, spectral Doppler, color Doppler, and power Doppler as needed of all accessible portions of each vessel. Bilateral testing is considered an integral part of a complete examination. Limited examinations for reoccurring indications may be performed as noted.  Right Findings: +----------+------------+---------+-----------+----------+-------+ RIGHT     CompressiblePhasicitySpontaneousPropertiesSummary +----------+------------+---------+-----------+----------+-------+ IJV           Full       Yes       Yes                      +----------+------------+---------+-----------+----------+-------+  Subclavian    Full       Yes       Yes                       +----------+------------+---------+-----------+----------+-------+ Axillary      Full       Yes       Yes                      +----------+------------+---------+-----------+----------+-------+ Brachial      Full       Yes       Yes                      +----------+------------+---------+-----------+----------+-------+ Radial        Full                                          +----------+------------+---------+-----------+----------+-------+ Ulnar         Full                                          +----------+------------+---------+-----------+----------+-------+ Cephalic      None                                   Acute  +----------+------------+---------+-----------+----------+-------+ Basilic       Full                                          +----------+------------+---------+-----------+----------+-------+ Superficial thrombus located in the cephalic vein is noted to only be in the mid forearm.  Left Findings: +----------+------------+---------+-----------+----------+-------+ LEFT      CompressiblePhasicitySpontaneousPropertiesSummary +----------+------------+---------+-----------+----------+-------+ Subclavian    Full       Yes       Yes                      +----------+------------+---------+-----------+----------+-------+  Summary:  Right: No evidence of deep vein thrombosis in the upper extremity.  Left: No evidence of thrombosis in the subclavian. Findings consistent with acute superficial vein thrombosis involving the right cephalic vein.  *See table(s) above for measurements and observations.  Diagnosing physician: Monica Martinez MD Electronically signed by Monica Martinez MD on 03/02/2021 at 3:00:02 PM.    Final     Cardiac Studies   Echocardiogram 02/28/2021: Impressions: 1. Left ventricular ejection fraction, by estimation, is 50 to 55%. The  left ventricle has low normal function. The left ventricle demonstrates  regional wall  motion abnormalities (see scoring diagram/findings for  description). There is mild left  ventricular hypertrophy. Left ventricular diastolic parameters are  consistent with Grade II diastolic dysfunction (pseudonormalization).  Elevated left ventricular end-diastolic pressure. There is moderate  hypokinesis of the left ventricular, basal  inferior wall and inferoseptal wall.   2. Right ventricular systolic function is low normal. The right  ventricular size is normal.   3. Effusion measures up to 1.17 cm posterior to the LV. a small  pericardial effusion is present. The pericardial effusion is posterior to  the left ventricle. There is no evidence of cardiac tamponade.  4. The mitral valve is abnormal. Mild mitral valve regurgitation.   5. The aortic valve is tricuspid. Aortic valve regurgitation is not  visualized. No aortic stenosis is present.   6. The inferior vena cava is dilated in size with <50% respiratory  variability, suggesting right atrial pressure of 15 mmHg.   Comparison(s): Changes from prior study are noted. 09/19/2019: LVEF 55-60%,  moderate pericardial effusion, no tamponade.   Patient Profile     70 y.o. male with a history of CAD with NSTEMI in 06/2013 s/p DES to RCA, ischemic cardiomyopathy with EF as low as 40% in 2013 but improved to 55-60% in 09/2019, paroxysmal atrial fibrillation/flutter s/p PVI ablation x2 in 11/1998 at [facility], pericarditis in 2001 with chronic pericardial effusion, syncope presumed to be vasovagal s/p loop recorder, hyperlipidemia, type 2 diabetes mellitus, obstructive sleep apnea on CPAP, nonalcoholic cirrhosis of liver, who is being seen 02/28/2021 for the evaluation of atrial fibrillation at the request of Dr. Cathlean Sauer.    Assessment & Plan    Paroxysmal Atrial Fibrillation/Flutter - Patient has a long history of atrial fibrillation/flutter. S/[facility] ablation x[facility] in [facility]. No recurrence since then and not on anticoagulation at home. Patient  was admitted in sepsis secondary to UTI and has had recurrent paroxysmal atrial fibrillation intermittently throughout admission. Started on IV Cardizem and then IV Amiodarone and converted back to sinus rhythm.  - Maintaining sinus rhythm. Rates currently in the 80s. - Potassium 3.5 this am and Mag 1.8 >>replete to keep at >4.0  and >2.0, respectively. Being repleted by primary team.  - TSH normal. - Echo showed LVEF of 50-55%.  - IV Cardizem stopped on 03/01/2021.  - Will not discharge him on this given underlying liver cirrhosis. - Continue Lopressor 72m twice daily. - he was only taking Cardizem every 36 hours PTA but given good BP and HR will only send home on BB - CHA2DS2-VASc = 5 (CAD, DM, age, prior infarct noted on MRI). Has not been started on any anticoagulation due to hematuria and anemia. Consider outpatient monitor to evaluate for recurrent atrial fibrillation and to help guide anticoagulation recommendations. [facility]  Acute Hypoxic Respiratory Failure Possible Acute Diastolic CHF - Chest-ray on 02/26/2021 showed bilateral patchy airspace opacity in the lungs (right > left) concerning for bilateral multilobar pneumonia. There was also concern for possible ARDS or aspiration pneumonitis/pneumonia. Repeat chest x-ray on today showed persistent but improved airspace opacities (right > left). - BNP 472. - Has received IV Lasix for several days.  Documented urine output of 2.8L yesterday. Net negative 16.7 L since admission.  - SCr  stable at 1 - Echo showed LVEF of 50-55%  - Does not appear overloaded on exam.   Elevated Troponin History of CAD - S/p DES to RCA in 2015.  - High-sensitivity troponin minimally elevated and flat at 39 >> 38 on 9/7 and then 11 on 9/12.  - Echo report did report hypokinesis of the basal inferior wall and inferoseptal wall but I personally reviewed images and felt wall motion was normal in those areas - No chest  pain. - Has been  maintained on DAPT with Aspirin and Plavix since 2015. Both on hold due to anemia. Can restart when OK with primary team. - Continue beta-blocker and statin. - Troponin elevation not consistent with ACS. Consistent with demand in setting of acute illness. No ischemic work-up necessary at this time.   History of Pericarditis with Chronic  Pericardial Effusion - Moderate effusion noted on last Echo in 09/2019. - Small effusion noted on Echo this admission.   Acute Blood Loss Anemia Secondary to Hematuria - Hemoglobin 6.8 on admission. S/p 2 units of PRBC. Slowly improving and stable at 9.7 today. - Treated with CBI. - Management per Urology and primary team.   AKI - Creatinine 2.11 on admission. Improved to 1 today. Baseline 0.835 to 1.2. - Continue to avoid Nephrotoxic agents.  - Continue to follow closely.   Hypokalemia - Potassium 3.5 today. Goal >4.0 given atrial fibrillation. - Being repleted by primary team. Also need to replete Mag - Continue to monitor.    Hypomagnesemia - Magnesium 1.8  today Goal >2.0 given atrial fibrillation. - Being repleted by primary team.  - Continue to monitor.   Otherwise, per primary team: - Severe sepsis secondary to UTI - New metabolic encephalopathy - Acute gastritis - Dyslipidemia - Type 2 diabetes mellitus   [facility] will sign off.   Medication Recommendations:  Lopressor 80m BID, Crestor 558mdaily.  Restart ASA and Plavix when ok with Urology Other recommendations (labs, testing, etc):  none Follow up as an outpatient:  [facility] or [facility], please contact [facility] consult [email or web address] for contact info under        Signed, TrFransico HimMD  03/04/2021, 8:18 AM

## 2021-03-04 NOTE — Progress Notes (Signed)
This RN explained discharge instructions. Patient verbalized understanding. Questions answered. PIVs removed. Patient transferred downstairs via wheelchair by RN. Patient transferred in car safely. All belongings returned

## 2021-03-04 NOTE — TOC Progression Note (Signed)
Transition of Care [facility]) - Progression Note    Patient Details  Name: Kristopher [name] MRN: [id number] Date of Birth: 05-07-1951  Transition of Care [facility]) CM/SW Contact  Leeroy Cha, RN Phone Number: 03/04/2021, 7:34 AM  Clinical Narrative:    Remains on iv amiodarone for a.fib. iv mgso4 2gm x1, gosal isd to return to home with possible hhc   Expected Discharge Plan: Home/Self Care Barriers to Discharge: Continued Medical Work up  Expected Discharge Plan and Services Expected Discharge Plan: Home/Self Care   Discharge Planning Services: CM Consult Post Acute Care Choice: [place] arrangements for the past 2 months: Single Family Home                                       Social Determinants of Health (SDOH) Interventions    Readmission Risk Interventions No flowsheet data found.

## 2021-03-04 NOTE — Discharge Summary (Signed)
Physician Discharge Summary  Kristopher [name] PRX:[id number] DOB: 04-Oct-1950 DOA: 02/23/2021  PCP: Townsend Roger, MD  Admit date: 02/23/2021 Discharge date: 03/04/2021  Admitted From: Home  Disposition:  Home with [facility]  Recommendations for Outpatient Follow-up:  Follow up with PCP in 1-2 weeks Please obtain BMP/CBC in one week Follow up with urology for stent removal.  Follow up with primary cardiologist to discussed anticoagulation for A fib.  Continue to hold plavix at discharge. Follow up with urologist for stent removal and recommendation in regards resumption of plavix after.   Home Health: Yes.   Discharge Condition: Stable.  CODE STATUS: Full code Diet recommendation: Carb Modified   Brief/Interim Summary: 70 year old with past medical history significant for A. fib, CAD, NASH, dyslipidemia, type 2 diabetes and urolithiasis admitted with severe sepsis due to urinary tract infection, Klebsiella bacteremia, ESBL.  Patient presents with fever, hematuria.  Hospital course complicated by A. fib with RVR, acute heart failure exacerbation, ARDS with acute hypoxic respiratory failure and acute metabolic encephalopathy.  He was admitted to the ICU, he improved with IV fluids and IV antibiotics, subsequently cardiology consulted for A. fib RVR.  Patient was on IV amiodarone.  He was also started on metoprolol. From infection he has completed IV antibiotics in the hospital.  For A. fib IV amiodarone has been discontinued.  Patient has been clear for discharge by cardiology and  urology.   1 severe sepsis secondary to complicated UTI (ESBL Klebsiella and Enterococcus )in the setting of indwelling Foley catheter with ESBL Klebsiella bacteremia -Treated with IV Merrem as recommended per ID to complete a 7-day course of treatment with end date of 03/03/2021. -Monitor off of antibiotics. -ID following and appreciate input and recommendations. -Remain afebrile, WBC 10. Sepsis resolved. Discussed with ID ok  to discharge patient.  -He has completed antibiotics course.   2.  Right forearm pain/swelling/superficial cephalic vein thrombosis -Patient and wife with concerns for right forearm infection. -Wife concerned about a MRSA infection. -Plain films obtained with no fracture and consistent with soft tissue swelling.   -Dopplers obtained negative for DVT however did show superficial cephalic vein thrombus.   -Currently placed on warm compresses per ID. Continue with warm compress.   3.  Acute hypoxemic respiratory failure secondary to ARDS and acute diastolic heart failure/paroxysmal atrial fibrillation (status post ablation in the past) -treated with amiodarone drip.  Currently normal sinus rhythm with some occasional PVCs. -A. fib likely secondary to sepsis and acute issues. -2D echo with EF of 50 to 55%, moderate hypokinesis of left ventricular basal inferior wall and inferior septal wall, small pericardial effusion. -Received IV lasix.  -Continue current dose of oral Lopressor. -Anticoagulation on hold due to recent bleeding. -Remain on RA. Amiodarone gtt discontinue. Ok to discharge home per cardiology. Discharge on Metoprolol, hold Cardizem at discharge. Needs close follow up with primary cardiologist.   4.  Acute blood loss anemia secondary to hematuria/thrombocytopenia -Status post transfusion 3 units packed red blood cells. -Hemoglobin stable at 9.7.   -Hematuria resolving.   5.  Acute kidney injury/hyponatremia/hypokalemia/hypomagnesemia/hypophosphatemia/anion gap metabolic acidosis -Patient on diuretics likely leading to electrolyte abnormalities. -Renal function improved with creatinine currently at 0.96. -plan to give oral dose of potasium and mag.  -improved.   6.  Acute metabolic encephalopathy -Likely secondary to acute illness in the setting of acute kidney injury. -CT head, MRI brain negative for acute abnormalities. -Improving clinically. -Continue current regimen of  tramadol and baclofen for back pain control and  bupropion for depression. -Neurontin, Wellbutrin doses adjusted due to AKI in the setting of metabolic encephalopathy. -Supportive care.  7.  Diabetes mellitus type 2/dyslipidemia -Hemoglobin A1c 7.4 -CBG at 148 this morning.  -resume home medication at discharge.  -Continue statin.   8.  Acute gastritis -Continue PPI, sucralfate.  9.  History of pericarditis with chronic pericardial effusion -Moderate effusion noted on echo 09/2019. -Small effusion noted on echo during this admission. -Per cardiology.  10.  Elevated troponin/history of CAD -Status post DES to RCA in 2015 -2D echo with EF of 50 to 55%, moderate hypokinesis of left ventricular basal inferior wall and inferior septal wall, small pericardial effusion. -Patient noted to be on DAPT with aspirin and Plavix since 2015 currently on hold due to hematuria. -Urology to advise when DAPT with aspirin and Plavix may be resumed. -Continue beta-blocker, statin. -Per cardiology troponin elevation likely secondary to demand ischemia. -No further cardiac work-up needed at this time per cardiology.      Discharge Diagnoses:  Principal Problem:   Severe Sepsis secondary to UTI [facility]) Active Problems:   Coronary artery disease involving native coronary artery of native heart without angina pectoris   Atrial fibrillation s/p PVI ablation at [facility] 2001   Obstructive sleep apnea syndrome   Cirrhosis ([place])   Type 2 diabetes mellitus without complications ([place])   AKI (acute kidney injury) ([place])   Anemia   Thrombocytopenia ([place])   Sepsis with acute renal failure without septic shock ([facility])   Hematuria   Dyspnea   Atrial fibrillation with RVR ([facility])   ESBL (extended spectrum beta-lactamase) producing bacteria infection   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)   Gram-negative bacteremia   Primary hypertension   Pure hypercholesterolemia   Acute cephalic vein thrombosis, right    Hypokalemia   Hypomagnesemia    Discharge Instructions  Discharge Instructions     Diet - low sodium heart healthy   Complete by: As directed    Increase activity slowly   Complete by: As directed    No wound care   Complete by: As directed       Allergies as of 03/04/2021       Reactions   Codeine Nausea And Vomiting   Fentanyl Nausea And Vomiting   Naproxen Nausea Only   Other Nausea And Vomiting   1. N/V to all opiates. He is able to take versed. (noted 09/29/13) 2.The patient states that he has had  a reaction to all narcotics. He can take them though. (noted 06/26/13)        Medication List     STOP taking these medications    ciprofloxacin 500 MG tablet Commonly known as: CIPRO   clopidogrel 75 MG tablet Commonly known as: PLAVIX   diltiazem 120 MG 12 hr capsule Commonly known as: CARDIZEM SR   gabapentin 600 MG tablet Commonly known as: NEURONTIN Replaced by: gabapentin 100 MG capsule   ibuprofen 200 MG tablet Commonly known as: ADVIL   niacin 500 MG tablet   promethazine 25 MG tablet Commonly known as: PHENERGAN       TAKE these medications    aspirin EC 81 MG tablet Take 81 mg by mouth every evening.   Baclofen 5 MG Tabs Take 5 mg by mouth 3 (three) times daily as needed for muscle spasms. What changed:  medication strength how much to take when to take this reasons to take this   buPROPion 150 MG 24 hr tablet Commonly known as: WELLBUTRIN XL  Take 1 tablet (150 mg total) by mouth daily. What changed:  medication strength how much to take   gabapentin 100 MG capsule Commonly known as: NEURONTIN Take 1 capsule (100 mg total) by mouth 3 (three) times daily. Replaces: gabapentin 600 MG tablet   metFORMIN 500 MG 24 hr tablet Commonly known as: GLUCOPHAGE-XR Take 1,000 mg by mouth 2 (two) times daily.   metoprolol tartrate 50 MG tablet Commonly known as: LOPRESSOR Take 1 tablet (50 mg total) by mouth 2 (two) times daily.    multivitamins with iron Tabs tablet Take 1 tablet by mouth daily.   nitroGLYCERIN 0.4 MG SL tablet Commonly known as: NITROSTAT Place 0.4 mg under the tongue every 5 (five) minutes as needed for chest pain.   pantoprazole 40 MG tablet Commonly known as: PROTONIX Take 40 mg by mouth 2 (two) times daily.   phosphorus 155-852-130 MG tablet Commonly known as: K PHOS NEUTRAL Take 1 tablet (250 mg total) by mouth 2 (two) times daily for 3 days.   pioglitazone 15 MG tablet Commonly known as: ACTOS Take 15 mg by mouth daily.   rOPINIRole 1 MG tablet Commonly known as: REQUIP Take 1 mg by mouth at bedtime.   rosuvastatin 5 MG tablet Commonly known as: CRESTOR Take 5 mg by mouth every evening.   sucralfate 1 g tablet Commonly known as: CARAFATE Take 1 tablet (1 g total) by mouth 4 (four) times daily -  with meals and at bedtime.   tamsulosin 0.4 MG Caps capsule Commonly known as: FLOMAX Take 0.4 mg by mouth every evening.   thiamine 100 MG tablet Take 1 tablet (100 mg total) by mouth daily.   traMADol 50 MG tablet Commonly known as: Ultram Take 1-2 tablets (50-100 mg total) by mouth every 6 (six) hours as needed for moderate pain. What changed: how much to take        Follow-up Information     [name], MD Follow up.   Specialty: Urology Why: Stent removal - will contact patient with appointment. Contact information: [address] [place] [address] [phone]                Allergies  Allergen Reactions   Codeine Nausea And Vomiting   Fentanyl Nausea And Vomiting   Naproxen Nausea Only   Other Nausea And Vomiting    1. N/V to all opiates. He is able to take versed. (noted 09/29/13) 2.The patient states that he has had  a reaction to all narcotics. He can take them though. (noted 06/26/13)    Consultations: Cardiology Urology ID   Procedures/Studies: CT ABDOMEN PELVIS WO CONTRAST  Result Date: 02/23/2021 CLINICAL DATA:  Abdominal  pain, fever. EXAM: CT ABDOMEN AND PELVIS WITHOUT CONTRAST TECHNIQUE: Multidetector CT imaging of the abdomen and pelvis was performed following the standard protocol without IV contrast. COMPARISON:  02/20/2021 FINDINGS: Lower chest: Stable moderate pericardial effusion. Low-density blood pool suggesting anemia. Coronary and descending thoracic aortic atherosclerotic calcification. Dependent subsegmental atelectasis in both lower lobes. Hepatobiliary: Unremarkable Pancreas: Unremarkable Spleen: Unremarkable Adrenals/Urinary Tract: Both adrenal glands appear normal. Foley catheter noted in the urinary bladder. There is a left double-J ureteral stent with proximal and distal loops formed in the renal pelvis and urinary bladder, respectively. Continued accentuated density in the left renal collecting system, density up to about 59 Hounsfield units, formerly 61 Hounsfield units. Decreased to scree of distention of the left renal collecting system compared to previous. No substantial hydroureter. Left perirenal  stranding with some mild high density components as on the prior exam, although nodularity along the posterior perirenal fascia is reduced. These likely represent blood products. Stomach/Bowel: There are few mildly dilated loops of small bowel in the left upper quadrant, without an obvious transition. Formed stool in the colon. Mild sigmoid colon diverticulosis. Vascular/Lymphatic: Atherosclerosis is present, including aortoiliac atherosclerotic disease. No pathologic adenopathy identified. Reproductive: Unremarkable Other: No supplemental non-categorized findings. Musculoskeletal: Mild bilateral degenerative hip arthropathy. Lower lumbar spondylosis. Rim calcified synovial cyst posterior to the left L5-S1 facet joint, not in a position to cause impingement. Transitional S1 vertebra. IMPRESSION: 1. Reduced dilation of the left renal collecting system although with some persistent high density material in the  collecting system especially in the lower pole likely from blood products. 2. Reduced high-density nodularity along the perirenal space posteriorly, likely from clot retraction/early resorption. 3. Left double-J ureteral stent remains in place. No appreciable hydroureter despite the mild prominence of the lower pole collecting system. 4. Stable moderate pericardial effusion. 5. Low-density blood pool suggests anemia. 6.  Aortic Atherosclerosis (ICD10-I70.0).  Coronary atherosclerosis. 7. Several mildly dilated loops of small bowel in the left upper quadrant, query ileus or mild enteritis. No obvious transition to indicate obstruction. Electronically Signed   By: Van Clines M.D.   On: 02/23/2021 18:07   DG Forearm Right  Result Date: 03/02/2021 CLINICAL DATA:  Right forearm pain. EXAM: RIGHT FOREARM - 2 VIEW COMPARISON:  None. FINDINGS: There is no evidence of fracture or other focal bone lesions. Soft tissue swelling of the proximal to mid forearm. Bandage material overlying dorsal and ulnar aspect of the forearm. IMPRESSION: 1. Soft tissue swelling of the proximal to mid forearm. No acute osseous abnormality. Electronically Signed   By: Titus Dubin M.D.   On: 03/02/2021 13:56   DG Abd 1 View  Result Date: 02/28/2021 CLINICAL DATA:  Fever and chills EXAM: ABDOMEN - 1 VIEW COMPARISON:  Ultrasound from the previous day FINDINGS: Scattered large and small bowel gas is noted. No abnormal mass or abnormal calcifications are seen. Left ureteral stent is noted in satisfactory position. No acute bony abnormality is noted. IMPRESSION: No acute abnormality noted. Electronically Signed   By: Inez Catalina M.D.   On: 02/28/2021 11:38   CT HEAD WO CONTRAST (5MM)  Result Date: 02/23/2021 CLINICAL DATA:  Head trauma, minor EXAM: CT HEAD WITHOUT CONTRAST TECHNIQUE: Contiguous axial images were obtained from the base of the skull through the vertex without intravenous contrast. COMPARISON:  2015 FINDINGS:  Brain: There is no acute intracranial hemorrhage, mass effect, or edema. Gray-white differentiation is preserved. There is no extra-axial fluid collection. Ventricles and sulci are within normal limits in size and configuration. Patchy low-attenuation in the supratentorial white matter is nonspecific but may reflect mild chronic microvascular ischemic changes. Vascular: There is atherosclerotic calcification at the skull base. Skull: Calvarium is unremarkable. Sinuses/Orbits: No acute finding. Other: None. IMPRESSION: No acute intracranial abnormality. Electronically Signed   By: Macy Mis M.D.   On: 02/23/2021 18:00   MR BRAIN WO CONTRAST  Result Date: 02/26/2021 CLINICAL DATA:  Initial evaluation for neuro deficit, stroke suspected. EXAM: MRI HEAD WITHOUT CONTRAST TECHNIQUE: Multiplanar, multiecho pulse sequences of the brain and surrounding structures were obtained without intravenous contrast. COMPARISON:  Prior CT from earlier the same day. FINDINGS: Brain: A limited stroke protocol consisting of axial DWI and FLAIR sequences only was performed. Diffusion-weighted imaging demonstrates no convincing foci of restricted diffusion to suggest acute or subacute  ischemia. Underlying age-related cerebral atrophy. Patchy confluent T2/FLAIR hyperintensity involving the periventricular deep white matter both cerebral hemispheres most consistent with chronic small vessel ischemic disease, moderate in nature. Chronic lacunar infarct present at the anterior left frontal centrum semi ovale. No visible mass lesion, mass effect or midline shift. No hydrocephalus or extra-axial fluid collection. No secondary signs for acute intracranial hemorrhage. Vascular: Major intracranial vascular flow voids are grossly maintained at the skull base. Skull and upper cervical spine: No visible focal marrow replacing lesion. Scalp soft tissues demonstrate no acute finding. Sinuses/Orbits: Globes and orbital soft tissues grossly within  normal limits. Paranasal sinuses are largely clear on this limited exam. Trace left with small to moderate right mastoid effusion noted. Visualized nasopharynx grossly unremarkable. Other: None. IMPRESSION: 1. Limited stroke protocol MRI only was performed. 2. No acute intracranial infarct. No other definite acute intracranial abnormality on this limited exam. 3. Underlying age-related cerebral atrophy with moderate chronic small vessel ischemic disease. Remote lacunar infarct at the left frontal centrum semi ovale. Electronically Signed   By: Jeannine Boga M.D.   On: 02/26/2021 22:07   US Abdomen Complete  Result Date: 02/27/2021 CLINICAL DATA:  Cirrhosis EXAM: ABDOMEN ULTRASOUND COMPLETE COMPARISON:  02/23/2021 FINDINGS: Gallbladder: Echogenic sludge is identified within the gallbladder. No shadowing gallstones or evidence of cholecystitis. No gallbladder wall thickening. Common bile duct: Diameter: 2 mm Liver: No focal lesion identified. Within normal limits in parenchymal echogenicity. Portal vein is patent on color Doppler imaging with normal direction of blood flow towards the liver. IVC: No abnormality visualized. Pancreas: Visualized portion unremarkable. Spleen: Size and appearance within normal limits. Right Kidney: Length: 12.9 cm. Echogenicity within normal limits. No mass or hydronephrosis visualized. Left Kidney: Length: 13.5 cm. There is prominence of the left renal pelvis without frank hydronephrosis. The left ureteral stent seen previously is not identified. Cystic area within the lower pole left kidney measures 2.4 x 2.5 x 1.5 cm, and could reflect a focal dilated calyx or peripelvic cyst. No shadowing gallstones. Echogenicity within normal limits. Abdominal aorta: No aneurysm visualized. Other findings: Small right pleural effusion is incidentally noted. IMPRESSION: 1. Mild prominence of the left renal pelvis, without frank hydronephrosis. The left ureteral stent seen previously is not  identified on this exam. 2. Focally dilated lower pole left renal calyx versus peripelvic cyst, measuring up to 2.5 cm. 3. Gallbladder sludge. No evidence of cholelithiasis or cholecystitis. Electronically Signed   By: Randa Ngo M.D.   On: 02/27/2021 20:23   DG CHEST PORT 1 VIEW  Result Date: 03/01/2021 CLINICAL DATA:  Dyspnea EXAM: PORTABLE CHEST 1 VIEW COMPARISON:  02/27/2021 FINDINGS: Cardiac shadow is stable. Loop recorder is again noted. Patchy airspace opacities are noted bilaterally but continue to improved when compare with the prior exam. No sizable effusion is noted. No bony abnormality is seen. Postsurgical changes in the cervical spine are noted. IMPRESSION: Persistent but improved airspace opacities right greater than left. Electronically Signed   By: Inez Catalina M.D.   On: 03/01/2021 11:13   DG CHEST PORT 1 VIEW  Result Date: 02/27/2021 CLINICAL DATA:  Shortness of breath EXAM: PORTABLE CHEST 1 VIEW COMPARISON:  02/26/2021, 02/23/2021 FINDINGS: Electronic device over left chest. Partially visualized cervical hardware. Slight decreased asymmetric right greater than left pulmonary airspace disease. Stable cardiomediastinal silhouette with aortic atherosclerosis. No pneumothorax IMPRESSION: Slightly improved aeration since 02/26/2021 with decreased asymmetric right greater than left pulmonary airspace disease. Electronically Signed   By: Madie Reno.D.  On: 02/27/2021 15:41   DG CHEST PORT 1 VIEW  Result Date: 02/26/2021 CLINICAL DATA:  Dyspnea. EXAM: PORTABLE CHEST 1 VIEW COMPARISON:  02/23/2021 FINDINGS: Interval extensive patchy opacity in the right mid, upper and lower lung zones medially. Mild patchy opacity in the scratch the interval mild patchy opacity in the left upper lung zone. Normal sized heart. No pleural fluid. Thoracic spine degenerative changes. Lower cervical spine fixation hardware. Left chest loop recorder. IMPRESSION: Bilateral patchy airspace opacity in the  lungs, significantly greater on the right compared to the left. This is concerning for bilateral, multilobar pneumonia. Alveolar edema is less likely in the absence of vascular congestion or cardiomegaly. Electronically Signed   By: Claudie Revering M.D.   On: 02/26/2021 14:55   DG Chest Port 1 View  Result Date: 02/23/2021 CLINICAL DATA:  Weakness decreased mobility EXAM: PORTABLE CHEST 1 VIEW COMPARISON:  09/18/2019 FINDINGS: Electronic recording device over left chest. No focal opacity or pleural effusion. Normal cardiomediastinal silhouette. No pneumothorax. IMPRESSION: No active disease. Electronically Signed   By: Donavan Foil M.D.   On: 02/23/2021 17:08   DG C-Arm 1-60 Min-No Report  Result Date: 02/11/2021 Fluoroscopy was utilized by the requesting physician.  No radiographic interpretation.   ECHOCARDIOGRAM COMPLETE  Result Date: 02/28/2021    ECHOCARDIOGRAM REPORT   Patient Name:   Kristopher [name] Date of Exam: 02/28/2021 Medical Rec #:  [id number]    Height:       67.0 in Accession #:    [id number]   Weight:       159.8 lb Date of Birth:  04-28-1951     BSA:          1.838 m Patient Age:    71 years     BP:           119/68 mmHg Patient Gender: M            HR:           81 bpm. Exam Location:  Inpatient Procedure: 2D Echo, Cardiac Doppler, Color Doppler and Intracardiac            Opacification Agent Indications:    Dyspnea R06.00  History:        Patient has prior history of Echocardiogram examinations, most                 recent 09/19/2019. CAD and Previous Myocardial Infarction,                 Arrythmias:Atrial Fibrillation and Atrial Flutter; Risk                 Factors:Dyslipidemia, Diabetes and Sleep Apnea. 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 Fevers, chills, nausea, vomiting, and abdominal pain.                 Acute Hypoxic Respiratory  Failure, pneumonia. Severe sepsis                 secondary to UTI.  Sonographer:    Darlina Sicilian RDCS Referring Phys: [id number] [place]  1. Left ventricular ejection fraction, by  estimation, is 50 to 55%. The left ventricle has low normal function. The left ventricle demonstrates regional wall motion abnormalities (see scoring diagram/findings for description). There is mild left ventricular hypertrophy. Left ventricular diastolic parameters are consistent with Grade II diastolic dysfunction (pseudonormalization). Elevated left ventricular end-diastolic pressure. There is moderate hypokinesis of the left ventricular, basal inferior wall and inferoseptal wall.  2. Right ventricular systolic function is low normal. The right ventricular size is normal.  3. Effusion measures up to 1.17 cm posterior to the LV. a small pericardial effusion is present. The pericardial effusion is posterior to the left ventricle. There is no evidence of cardiac tamponade.  4. The mitral valve is abnormal. Mild mitral valve regurgitation.  5. The aortic valve is tricuspid. Aortic valve regurgitation is not visualized. No aortic stenosis is present.  6. The inferior vena cava is dilated in size with <50% respiratory variability, suggesting right atrial pressure of 15 mmHg. Comparison(s): Changes from prior study are noted. 09/19/2019: LVEF 55-60%, moderate pericardial effusion, no tamponade. FINDINGS  Left Ventricle: Left ventricular ejection fraction, by estimation, is 50 to 55%. The left ventricle has low normal function. The left ventricle demonstrates regional wall motion abnormalities. Moderate hypokinesis of the left ventricular, basal inferior  wall and inferoseptal wall. Definity contrast agent was given IV to delineate the left ventricular endocardial borders. The left ventricular internal cavity size was normal in size. There is mild left ventricular hypertrophy. Left ventricular diastolic parameters are  consistent with Grade II diastolic dysfunction (pseudonormalization). Elevated left ventricular end-diastolic pressure. Right Ventricle: The right ventricular size is normal. No increase in right ventricular wall thickness. Right ventricular systolic function is low normal. Left Atrium: Left atrial size was normal in size. Right Atrium: Right atrial size was normal in size. Pericardium: Effusion measures up to 1.17 cm posterior to the LV. A small pericardial effusion is present. The pericardial effusion is posterior to the left ventricle. There is no evidence of cardiac tamponade. Mitral Valve: The mitral valve is abnormal. There is mild thickening of the mitral valve leaflet(s). There is mild calcification of the mitral valve leaflet(s). Mild mitral valve regurgitation. Tricuspid Valve: The tricuspid valve is grossly normal. Tricuspid valve regurgitation is trivial. Aortic Valve: The aortic valve is tricuspid. Aortic valve regurgitation is not visualized. No aortic stenosis is present. Pulmonic Valve: The pulmonic valve was normal in structure. Pulmonic valve regurgitation is not visualized. Aorta: The aortic root and ascending aorta are structurally normal, with no evidence of dilitation. Venous: The inferior vena cava is dilated in size with less than 50% respiratory variability, suggesting right atrial pressure of 15 mmHg. IAS/Shunts: No atrial level shunt detected by color flow Doppler.  LEFT VENTRICLE PLAX 2D LVIDd:         5.20 cm      Diastology LVIDs:         3.90 cm      LV e' medial:    6.83 cm/s LV PW:         0.90 cm      LV E/e' medial:  15.2 LV IVS:        0.90 cm      LV e' lateral:   9.02 cm/s LVOT diam:     1.90 cm      LV E/e' lateral: 11.5 LV SV:         32 LV SV Index:   18 LVOT Area:     2.84 cm  LV Volumes (MOD) LV vol d,  MOD A2C: 89.0 ml LV vol d, MOD A4C: 166.0 ml LV vol s, MOD A2C: 89.1 ml LV vol s, MOD A4C: 50.5 ml LV SV MOD A2C:     -0.1 ml LV SV MOD A4C:     166.0 ml LV SV MOD BP:       63.3 ml RIGHT VENTRICLE RV S prime:     10.40 cm/s TAPSE (M-mode): 1.4 cm LEFT ATRIUM             Index       RIGHT ATRIUM           Index LA diam:        3.50 cm 1.90 cm/m  RA Area:     11.50 cm LA Vol (A2C):   46.2 ml 25.13 ml/m RA Volume:   23.00 ml  12.51 ml/m LA Vol (A4C):   50.0 ml 27.20 ml/m LA Biplane Vol: 52.2 ml 28.39 ml/m  AORTIC VALVE LVOT Vmax:   83.30 cm/s LVOT Vmean:  41.900 cm/s LVOT VTI:    0.114 m  AORTA Ao Root diam: 3.10 cm Ao Asc diam:  3.00 cm MITRAL VALVE MV Area (PHT): 4.60 cm     SHUNTS MV Decel Time: 165 msec     Systemic VTI:  0.11 m MV E velocity: 104.00 cm/s  Systemic Diam: 1.90 cm MV A velocity: 47.10 cm/s MV E/A ratio:  2.21 Lyman Bishop MD Electronically signed by Lyman Bishop MD Signature Date/Time: 02/28/2021/1:01:42 PM    Final    CT RENAL STONE STUDY  Result Date: 02/20/2021 CLINICAL DATA:  Difficulty urinating.  Recent urologic procedure. EXAM: CT ABDOMEN AND PELVIS WITHOUT CONTRAST TECHNIQUE: Multidetector CT imaging of the abdomen and pelvis was performed following the standard protocol without IV contrast. COMPARISON:  12/14/2020 FINDINGS: LOWER CHEST: Small pericardial effusion is unchanged HEPATOBILIARY: Normal hepatic contours. No intra- or extrahepatic biliary dilatation. The gallbladder is normal. PANCREAS: Normal pancreas. No ductal dilatation or peripancreatic fluid collection. SPLEEN: Normal. ADRENALS/URINARY TRACT: The adrenal glands are normal. Left nephroureteral stent extends into the urinary bladder. There is blood within the renal collecting system. Moderate left perinephric stranding. Right kidney and ureter are normal. There is a catheter within the urinary bladder. There is blood within the dependent portion of the bladder. STOMACH/BOWEL: There is no hiatal hernia. Normal duodenal course and caliber. No small bowel dilatation or inflammation. No focal colonic abnormality. Normal appendix. VASCULAR/LYMPHATIC: There is calcific atherosclerosis of the  abdominal aorta. No lymphadenopathy. REPRODUCTIVE: Normal prostate size with symmetric seminal vesicles. MUSCULOSKELETAL. No bony spinal canal stenosis or focal osseous abnormality. OTHER: None. IMPRESSION: 1. Status post left nephroureteral stent placement with left perinephric stranding and blood within the renal collecting system and urinary bladder. 2. Unchanged small pericardial effusion. Electronically Signed   By: Ulyses Jarred M.D.   On: 02/20/2021 01:19   CT HEAD CODE STROKE WO CONTRAST  Result Date: 02/26/2021 CLINICAL DATA:  Code stroke.  Neuro deficit, acute, stroke suspected EXAM: CT HEAD WITHOUT CONTRAST TECHNIQUE: Contiguous axial images were obtained from the base of the skull through the vertex without intravenous contrast. COMPARISON:  CT 02/23/2021. FINDINGS: Brain: No evidence of acute large vascular territory infarction, hemorrhage, hydrocephalus, extra-axial collection or mass lesion/mass effect. Similar patchy white matter hypodensities, nonspecific but compatible with chronic microvascular ischemic disease. Vascular: No hyperdense vessel identified. Skull: Normal. Negative for fracture or focal lesion. Sinuses/Orbits: Largely clear sinuses. Other: No mastoid effusions. ASPECTS [facility] Stroke Program Early CT Score) total score (0-10  with 10 being normal): 10. IMPRESSION: 1. No evidence of acute large vascular territory infarct or acute hemorrhage. ASPECTS is 10. 2. Chronic microvascular ischemic disease. Code stroke imaging results were communicated on 02/26/2021 at 6:22 pm to provider Dr. Cathlean Sauer via telephone, who verbally acknowledged these results. Electronically Signed   By: Margaretha Sheffield M.D.   On: 02/26/2021 18:24   VAS [place] UPPER EXTREMITY VENOUS DUPLEX  Result Date: 03/02/2021 UPPER VENOUS STUDY  Patient Name:  DEAKON FRIX  Date of Exam:   03/02/2021 Medical Rec #: [id number]     Accession #:    [id number] Date of Birth: [birth year]      Patient Gender: M Patient Age:   89 years  Exam Location:  [facility] Procedure:      VAS [place] UPPER EXTREMITY VENOUS DUPLEX Referring Phys: CORNELIUS VAN DAM --------------------------------------------------------------------------------  Indications: Pain Risk Factors: Trauma. Comparison Study: No prior studies. Performing Technologist: Oliver Hum RVT  Examination Guidelines: A complete evaluation includes B-mode imaging, spectral Doppler, color Doppler, and power Doppler as needed of all accessible portions of each vessel. Bilateral testing is considered an integral part of a complete examination. Limited examinations for reoccurring indications may be performed as noted.  Right Findings: +----------+------------+---------+-----------+----------+-------+ RIGHT     CompressiblePhasicitySpontaneousPropertiesSummary +----------+------------+---------+-----------+----------+-------+ IJV           Full       Yes       Yes                      +----------+------------+---------+-----------+----------+-------+ Subclavian    Full       Yes       Yes                      +----------+------------+---------+-----------+----------+-------+ Axillary      Full       Yes       Yes                      +----------+------------+---------+-----------+----------+-------+ Brachial      Full       Yes       Yes                      +----------+------------+---------+-----------+----------+-------+ Radial        Full                                          +----------+------------+---------+-----------+----------+-------+ Ulnar         Full                                          +----------+------------+---------+-----------+----------+-------+ Cephalic      None                                   Acute  +----------+------------+---------+-----------+----------+-------+ Basilic       Full                                          +----------+------------+---------+-----------+----------+-------+  Superficial thrombus located in the cephalic  vein is noted to only be in the mid forearm.  Left Findings: +----------+------------+---------+-----------+----------+-------+ LEFT      CompressiblePhasicitySpontaneousPropertiesSummary +----------+------------+---------+-----------+----------+-------+ Subclavian    Full       Yes       Yes                      +----------+------------+---------+-----------+----------+-------+  Summary:  Right: No evidence of deep vein thrombosis in the upper extremity.  Left: No evidence of thrombosis in the subclavian. Findings consistent with acute superficial vein thrombosis involving the right cephalic vein.  *See table(s) above for measurements and observations.  Diagnosing physician: Monica Martinez MD Electronically signed by Monica Martinez MD on 03/02/2021 at 3:00:02 PM.    Final      Subjective: He is feeling well, urine is more clear.  He was alert and oriented times 3.  He wishes to go home  Discharge Exam: Vitals:   03/04/21 1100 03/04/21 1200  BP:  125/68  Pulse: 71 66  Resp: 15 16  Temp:  98.5 F (36.9 C)  SpO2: 95% 100%     General: Pt is alert, awake, not in acute distress Cardiovascular: RRR, S1/S2 +, no rubs, no gallops Respiratory: CTA bilaterally, no wheezing, no rhonchi Abdominal: Soft, NT, ND, bowel sounds + Extremities: no edema, no cyanosis    The results of significant diagnostics from this hospitalization (including imaging, microbiology, ancillary and laboratory) are listed below for reference.     Microbiology: Recent Results (from the past 240 hour(s))  Blood Culture (routine x 2)     Status: Abnormal   Collection Time: 02/23/21  4:05 PM   Specimen: BLOOD RIGHT FOREARM  Result Value Ref Range Status   Specimen Description   Final    BLOOD RIGHT FOREARM Performed at [facility] [address] [place], [place] [address]    Special Requests   Final    BOTTLES DRAWN AEROBIC AND  ANAEROBIC Blood Culture adequate volume Performed at [facility] [address], [place], [place] [address]    Culture  Setup Time   Final    GRAM NEGATIVE RODS IN BOTH AEROBIC AND ANAEROBIC BOTTLES CRITICAL RESULT CALLED TO, READ BACK BY AND VERIFIED WITH: PHARMD M BELL [id number] AT 907 AM BY CM Performed at [facility], [place] [address] [place], [place] [address]    Culture (A)  Final    KLEBSIELLA PNEUMONIAE Confirmed Extended Spectrum Beta-Lactamase Producer (ESBL).  In bloodstream infections from ESBL organisms, carbapenems are preferred over piperacillin/tazobactam. They are shown to have a lower risk of mortality.    Report Status 02/26/2021 FINAL  Final   Organism ID, Bacteria KLEBSIELLA PNEUMONIAE  Final      Susceptibility   Klebsiella pneumoniae - MIC*    AMPICILLIN >=32 RESISTANT Resistant     CEFAZOLIN >=64 RESISTANT Resistant     CEFEPIME 2 SENSITIVE Sensitive     CEFTAZIDIME RESISTANT Resistant     CEFTRIAXONE >=64 RESISTANT Resistant     CIPROFLOXACIN 2 INTERMEDIATE Intermediate     GENTAMICIN >=16 RESISTANT Resistant     IMIPENEM <=0.25 SENSITIVE Sensitive     TRIMETH/SULFA >=320 RESISTANT Resistant     AMPICILLIN/SULBACTAM >=32 RESISTANT Resistant     PIP/TAZO 16 SENSITIVE Sensitive     * KLEBSIELLA PNEUMONIAE  Blood Culture ID Panel (Reflexed)     Status: Abnormal (Preliminary result)   Collection Time: 02/23/21  4:05 PM  Result Value Ref Range Status  Enterococcus faecalis NOT DETECTED NOT DETECTED Final   Enterococcus Faecium NOT DETECTED NOT DETECTED Final   Listeria monocytogenes NOT DETECTED NOT DETECTED Final   Staphylococcus species NOT DETECTED NOT DETECTED Final   Staphylococcus aureus (BCID) NOT DETECTED NOT DETECTED Final   Staphylococcus epidermidis NOT DETECTED NOT DETECTED Final   Staphylococcus lugdunensis NOT DETECTED NOT DETECTED Final   Streptococcus species NOT DETECTED NOT DETECTED Final   Streptococcus agalactiae  NOT DETECTED NOT DETECTED Final   Streptococcus pneumoniae NOT DETECTED NOT DETECTED Final   Streptococcus pyogenes NOT DETECTED NOT DETECTED Final   A.calcoaceticus-baumannii NOT DETECTED NOT DETECTED Final   Bacteroides fragilis NOT DETECTED NOT DETECTED Final   Enterobacterales PENDING NOT DETECTED Incomplete   Enterobacter cloacae complex NOT DETECTED NOT DETECTED Final   Escherichia coli NOT DETECTED NOT DETECTED Final   Klebsiella aerogenes NOT DETECTED NOT DETECTED Final   Klebsiella oxytoca NOT DETECTED NOT DETECTED Final   Klebsiella pneumoniae DETECTED (A) NOT DETECTED Final    Comment: CRITICAL RESULT CALLED TO, READ BACK BY AND VERIFIED WITH: PHARMD M BELL [id number] AT 907 AM BY CM    Proteus species NOT DETECTED NOT DETECTED Final   Salmonella species NOT DETECTED NOT DETECTED Final   Serratia marcescens NOT DETECTED NOT DETECTED Final   Haemophilus influenzae NOT DETECTED NOT DETECTED Final   Neisseria meningitidis NOT DETECTED NOT DETECTED Final   Pseudomonas aeruginosa NOT DETECTED NOT DETECTED Final   Stenotrophomonas maltophilia NOT DETECTED NOT DETECTED Final   Candida albicans NOT DETECTED NOT DETECTED Final   Candida auris NOT DETECTED NOT DETECTED Final   Candida glabrata NOT DETECTED NOT DETECTED Final   Candida krusei NOT DETECTED NOT DETECTED Final   Candida parapsilosis NOT DETECTED NOT DETECTED Final   Candida tropicalis NOT DETECTED NOT DETECTED Final   Cryptococcus neoformans/gattii NOT DETECTED NOT DETECTED Final   CTX-M ESBL DETECTED (A) NOT DETECTED Final    Comment: CRITICAL RESULT CALLED TO, READ BACK BY AND VERIFIED WITH: PHARMD M BELL [id number] AT 907 AM BY CM (NOTE) Extended spectrum beta-lactamase detected. Recommend a carbapenem as initial therapy.      Carbapenem resistance IMP NOT DETECTED NOT DETECTED Final   Carbapenem resistance KPC NOT DETECTED NOT DETECTED Final   Carbapenem resistance NDM NOT DETECTED NOT DETECTED Final   Carbapenem  resist OXA 48 LIKE NOT DETECTED NOT DETECTED Final   Carbapenem resistance VIM NOT DETECTED NOT DETECTED Final    Comment: Performed at [facility], [place] [address] [place], [place] [address]  Blood Culture (routine x 2)     Status: Abnormal   Collection Time: 02/23/21  4:24 PM   Specimen: BLOOD  Result Value Ref Range Status   Specimen Description   Final    BLOOD LEFT ANTECUBITAL Performed at [facility] [address] [place], [place] [address]    Special Requests   Final    BOTTLES DRAWN AEROBIC AND ANAEROBIC Blood Culture adequate volume Performed at [facility] [address], [place], [place] [address]    Culture  Setup Time   Final    GRAM NEGATIVE RODS AEROBIC BOTTLE ONLY CRITICAL VALUE NOTED.  VALUE IS CONSISTENT WITH PREVIOUSLY REPORTED AND CALLED VALUE.    Culture (A)  Final    KLEBSIELLA PNEUMONIAE SUSCEPTIBILITIES PERFORMED ON PREVIOUS CULTURE WITHIN THE LAST 5 DAYS. Performed at [facility], [place] [address] [place], [place] [address]    Report Status 02/26/2021 FINAL  Final  Resp Panel by RT-PCR (Flu A&B, Covid) Nasopharyngeal Swab     Status: None   Collection Time: 02/23/21  4:44 PM   Specimen: Nasopharyngeal Swab; Nasopharyngeal(NP) swabs in vial transport medium  Result Value Ref Range Status   SARS Coronavirus 2 by RT PCR NEGATIVE NEGATIVE Final    Comment: (NOTE) SARS-CoV-2 target nucleic acids are NOT DETECTED.  The SARS-CoV-2 RNA is generally detectable in upper respiratory specimens during the acute phase of infection. The lowest concentration of SARS-CoV-2 viral copies this assay can detect is 138 copies/mL. A negative result does not preclude SARS-Cov-2 infection and should not be used as the sole basis for treatment or other patient management decisions. A negative result may occur with  improper specimen collection/handling, submission of specimen other than nasopharyngeal swab, presence of  viral mutation(s) within the areas targeted by this assay, and inadequate number of viral copies(<138 copies/mL). A negative result must be combined with clinical observations, patient history, and epidemiological information. The expected result is Negative.  Fact Sheet for Patients:  EntrepreneurPulse.com.au  Fact Sheet for Healthcare Providers:  IncredibleEmployment.be  This test is no t yet approved or cleared by the [place] FDA and  has been authorized for detection and/or diagnosis of SARS-CoV-2 by FDA under an Emergency Use Authorization (EUA). This EUA will remain  in effect (meaning this test can be used) for the duration of the COVID-19 declaration under Section 564(b)(1) of the Act, 21 U.S.C.section 360bbb-3(b)(1), unless the authorization is terminated  or revoked sooner.       Influenza A by PCR NEGATIVE NEGATIVE Final   Influenza B by PCR NEGATIVE NEGATIVE Final    Comment: (NOTE) The Xpert Xpress SARS-CoV-2/FLU/RSV plus assay is intended as an aid in the diagnosis of influenza from Nasopharyngeal swab specimens and should not be used as a sole basis for treatment. Nasal washings and aspirates are unacceptable for Xpert Xpress SARS-CoV-2/FLU/RSV testing.  Fact Sheet for Patients: EntrepreneurPulse.com.au  Fact Sheet for Healthcare Providers: IncredibleEmployment.be  This test is not yet approved or cleared by the [place] FDA and has been authorized for detection and/or diagnosis of SARS-CoV-2 by FDA under an Emergency Use Authorization (EUA). This EUA will remain in effect (meaning this test can be used) for the duration of the COVID-19 declaration under Section 564(b)(1) of the Act, 21 U.S.C. section 360bbb-3(b)(1), unless the authorization is terminated or revoked.  Performed at [facility], [place] [address], [place], [place] [address]   Urine Culture      Status: Abnormal   Collection Time: 02/23/21  4:54 PM   Specimen: In/Out Cath Urine  Result Value Ref Range Status   Specimen Description   Final    IN/OUT CATH URINE Performed at [facility] [address] [place], [place] [address]    Special Requests   Final    NONE Performed at [facility], [place] [address] [place], [place] [address]    Culture (A)  Final    20,000 COLONIES/mL KLEBSIELLA PNEUMONIAE Confirmed Extended Spectrum Beta-Lactamase Producer (ESBL).  In bloodstream infections from ESBL organisms, carbapenems are preferred over piperacillin/tazobactam. They are shown to have a lower risk of mortality. 20,000 COLONIES/mL ENTEROCOCCUS FAECALIS    Report Status 02/26/2021 FINAL  Final   Organism ID, Bacteria KLEBSIELLA PNEUMONIAE (A)  Final   Organism ID, Bacteria ENTEROCOCCUS FAECALIS (A)  Final      Susceptibility   Enterococcus faecalis - MIC*    AMPICILLIN <=2 SENSITIVE Sensitive  NITROFURANTOIN <=16 SENSITIVE Sensitive     VANCOMYCIN 2 SENSITIVE Sensitive     * 20,000 COLONIES/mL ENTEROCOCCUS FAECALIS   Klebsiella pneumoniae - MIC*    AMPICILLIN >=32 RESISTANT Resistant     CEFAZOLIN >=64 RESISTANT Resistant     CEFEPIME >=32 RESISTANT Resistant     CEFTRIAXONE >=64 RESISTANT Resistant     CIPROFLOXACIN 2 INTERMEDIATE Intermediate     GENTAMICIN >=16 RESISTANT Resistant     IMIPENEM <=0.25 SENSITIVE Sensitive     NITROFURANTOIN 128 RESISTANT Resistant     TRIMETH/SULFA >=320 RESISTANT Resistant     AMPICILLIN/SULBACTAM >=32 RESISTANT Resistant     PIP/TAZO 16 SENSITIVE Sensitive     * 20,000 COLONIES/mL KLEBSIELLA PNEUMONIAE  MRSA Next Gen by PCR, Nasal     Status: None   Collection Time: 02/26/21 10:00 PM   Specimen: Nasal Mucosa; Nasal Swab  Result Value Ref Range Status   MRSA by PCR Next Gen NOT DETECTED NOT DETECTED Final    Comment: (NOTE) The GeneXpert MRSA Assay (FDA approved for NASAL specimens only), is  one component of a comprehensive MRSA colonization surveillance program. It is not intended to diagnose MRSA infection nor to guide or monitor treatment for MRSA infections. Test performance is not FDA approved in patients less than 9 years old. Performed at [facility], [place] [address] [place], [place] [address]      Labs: BNP (last 3 results) Recent Labs    02/28/21 1017  BNP 229.7*   Basic Metabolic Panel: Recent Labs  Lab 02/27/21 0242 02/27/21 1435 02/28/21 0254 03/01/21 0219 03/02/21 0236 03/02/21 1338 03/03/21 0238 03/04/21 0235  NA 130*  --  132* 132* 133*  --  135 135  K 3.2*  --  3.3* 2.9* 2.8* 3.3* 3.0* 3.5  CL 97*  --  96* 95* 92*  --  97* 97*  CO2 17*  --  17* 24 28  --  29 29  GLUCOSE 231*  --  269* 254* 164*  --  105* 161*  BUN 40*  --  40* 30* 25*  --  16 15  CREATININE 2.16*  --  2.03* 1.50* 1.12  --  0.96 1.00  CALCIUM 7.4*  --  8.1* 7.8* 7.6*  --  7.4* 7.5*  MG  --    < > 1.8 1.6* 1.6*  --  1.5* 1.8  PHOS 2.2*  --   --   --   --   --  1.2* 2.5   < > = values in this interval not displayed.   Liver Function Tests: Recent Labs  Lab 02/27/21 1658 03/03/21 0238 03/04/21 0235  AST 38  --   --   ALT 30  --   --   ALKPHOS 66  --   --   BILITOT 1.2  --   --   PROT 5.8*  --   --   ALBUMIN 2.3* 2.2* 2.4*   No results for input(s): LIPASE, AMYLASE in the last 168 hours. Recent Labs  Lab 02/26/21 1652 02/28/21 0254  AMMONIA 25 11   CBC: Recent Labs  Lab 02/27/21 0242 02/28/21 0254 03/01/21 0219 03/02/21 0236 03/03/21 0238 03/04/21 0235  WBC 8.2 11.5* 9.2 8.6 9.7 10.9*  NEUTROABS 7.0 10.0* 7.8*  --  7.8* 8.8*  HGB 9.0* 10.4* 9.6* 9.8* 9.7* 9.7*  HCT 26.4* 30.4* 27.2* 28.2* 29.2* 28.9*  MCV 90.1 89.7 87.7 87.3 89.3 88.9  PLT 121* 181 189 221 253 273  Cardiac Enzymes: No results for input(s): CKTOTAL, CKMB, CKMBINDEX, TROPONINI in the last 168 hours. BNP: Invalid input(s): POCBNP CBG: Recent Labs  Lab  03/03/21 1152 03/03/21 1642 03/03/21 2136 03/04/21 0728 03/04/21 1200  GLUCAP 150* 259* 177* 159* 113*   D-Dimer No results for input(s): DDIMER in the last 72 hours. Hgb A1c No results for input(s): HGBA1C in the last 72 hours. Lipid Profile No results for input(s): CHOL, HDL, LDLCALC, TRIG, CHOLHDL, LDLDIRECT in the last 72 hours. Thyroid function studies No results for input(s): TSH, T4TOTAL, T3FREE, THYROIDAB in the last 72 hours.  Invalid input(s): FREET3 Anemia work up No results for input(s): VITAMINB12, FOLATE, FERRITIN, TIBC, IRON, RETICCTPCT in the last 72 hours. Urinalysis    Component Value Date/Time   COLORURINE BROWN (A) 02/23/2021 1654   APPEARANCEUR TURBID (A) 02/23/2021 1654   APPEARANCEUR Clear 04/29/2018 1529   LABSPEC  02/23/2021 1654    TEST NOT REPORTED DUE TO COLOR INTERFERENCE OF URINE PIGMENT   PHURINE  02/23/2021 1654    TEST NOT REPORTED DUE TO COLOR INTERFERENCE OF URINE PIGMENT   GLUCOSEU (A) 02/23/2021 1654    TEST NOT REPORTED DUE TO COLOR INTERFERENCE OF URINE PIGMENT   HGBUR (A) 02/23/2021 1654    TEST NOT REPORTED DUE TO COLOR INTERFERENCE OF URINE PIGMENT   BILIRUBINUR (A) 02/23/2021 1654    TEST NOT REPORTED DUE TO COLOR INTERFERENCE OF URINE PIGMENT   BILIRUBINUR Negative 04/29/2018 1529   KETONESUR (A) 02/23/2021 1654    TEST NOT REPORTED DUE TO COLOR INTERFERENCE OF URINE PIGMENT   PROTEINUR (A) 02/23/2021 1654    TEST NOT REPORTED DUE TO COLOR INTERFERENCE OF URINE PIGMENT   UROBILINOGEN 0.2 03/21/2014 0243   NITRITE (A) 02/23/2021 1654    TEST NOT REPORTED DUE TO COLOR INTERFERENCE OF URINE PIGMENT   LEUKOCYTESUR (A) 02/23/2021 1654    TEST NOT REPORTED DUE TO COLOR INTERFERENCE OF URINE PIGMENT   Sepsis Labs Invalid input(s): PROCALCITONIN,  WBC,  LACTICIDVEN Microbiology Recent Results (from the past 240 hour(s))  Blood Culture (routine x 2)     Status: Abnormal   Collection Time: 02/23/21  4:05 PM   Specimen: BLOOD RIGHT  FOREARM  Result Value Ref Range Status   Specimen Description   Final    BLOOD RIGHT FOREARM Performed at [facility], [place] [address], [place], [place] [address]    Special Requests   Final    BOTTLES DRAWN AEROBIC AND ANAEROBIC Blood Culture adequate volume Performed at [facility] [address] [place], [place] [address]    Culture  Setup Time   Final    GRAM NEGATIVE RODS IN BOTH AEROBIC AND ANAEROBIC BOTTLES CRITICAL RESULT CALLED TO, READ BACK BY AND VERIFIED WITH: PHARMD M BELL [id number] AT 907 AM BY CM Performed at [facility], [place] [address] [place], [place] [address]    Culture (A)  Final    KLEBSIELLA PNEUMONIAE Confirmed Extended Spectrum Beta-Lactamase Producer (ESBL).  In bloodstream infections from ESBL organisms, carbapenems are preferred over piperacillin/tazobactam. They are shown to have a lower risk of mortality.    Report Status 02/26/2021 FINAL  Final   Organism ID, Bacteria KLEBSIELLA PNEUMONIAE  Final      Susceptibility   Klebsiella pneumoniae - MIC*    AMPICILLIN >=32 RESISTANT Resistant     CEFAZOLIN >=64 RESISTANT Resistant     CEFEPIME 2 SENSITIVE Sensitive     CEFTAZIDIME RESISTANT Resistant  CEFTRIAXONE >=64 RESISTANT Resistant     CIPROFLOXACIN 2 INTERMEDIATE Intermediate     GENTAMICIN >=16 RESISTANT Resistant     IMIPENEM <=0.25 SENSITIVE Sensitive     TRIMETH/SULFA >=320 RESISTANT Resistant     AMPICILLIN/SULBACTAM >=32 RESISTANT Resistant     PIP/TAZO 16 SENSITIVE Sensitive     * KLEBSIELLA PNEUMONIAE  Blood Culture ID Panel (Reflexed)     Status: Abnormal (Preliminary result)   Collection Time: 02/23/21  4:05 PM  Result Value Ref Range Status   Enterococcus faecalis NOT DETECTED NOT DETECTED Final   Enterococcus Faecium NOT DETECTED NOT DETECTED Final   Listeria monocytogenes NOT DETECTED NOT DETECTED Final   Staphylococcus species NOT DETECTED NOT DETECTED Final   Staphylococcus aureus  (BCID) NOT DETECTED NOT DETECTED Final   Staphylococcus epidermidis NOT DETECTED NOT DETECTED Final   Staphylococcus lugdunensis NOT DETECTED NOT DETECTED Final   Streptococcus species NOT DETECTED NOT DETECTED Final   Streptococcus agalactiae NOT DETECTED NOT DETECTED Final   Streptococcus pneumoniae NOT DETECTED NOT DETECTED Final   Streptococcus pyogenes NOT DETECTED NOT DETECTED Final   A.calcoaceticus-baumannii NOT DETECTED NOT DETECTED Final   Bacteroides fragilis NOT DETECTED NOT DETECTED Final   Enterobacterales PENDING NOT DETECTED Incomplete   Enterobacter cloacae complex NOT DETECTED NOT DETECTED Final   Escherichia coli NOT DETECTED NOT DETECTED Final   Klebsiella aerogenes NOT DETECTED NOT DETECTED Final   Klebsiella oxytoca NOT DETECTED NOT DETECTED Final   Klebsiella pneumoniae DETECTED (A) NOT DETECTED Final    Comment: CRITICAL RESULT CALLED TO, READ BACK BY AND VERIFIED WITH: PHARMD M BELL [id number] AT 907 AM BY CM    Proteus species NOT DETECTED NOT DETECTED Final   Salmonella species NOT DETECTED NOT DETECTED Final   Serratia marcescens NOT DETECTED NOT DETECTED Final   Haemophilus influenzae NOT DETECTED NOT DETECTED Final   Neisseria meningitidis NOT DETECTED NOT DETECTED Final   Pseudomonas aeruginosa NOT DETECTED NOT DETECTED Final   Stenotrophomonas maltophilia NOT DETECTED NOT DETECTED Final   Candida albicans NOT DETECTED NOT DETECTED Final   Candida auris NOT DETECTED NOT DETECTED Final   Candida glabrata NOT DETECTED NOT DETECTED Final   Candida krusei NOT DETECTED NOT DETECTED Final   Candida parapsilosis NOT DETECTED NOT DETECTED Final   Candida tropicalis NOT DETECTED NOT DETECTED Final   Cryptococcus neoformans/gattii NOT DETECTED NOT DETECTED Final   CTX-M ESBL DETECTED (A) NOT DETECTED Final    Comment: CRITICAL RESULT CALLED TO, READ BACK BY AND VERIFIED WITH: PHARMD M BELL [id number] AT 907 AM BY CM (NOTE) Extended spectrum beta-lactamase detected.  Recommend a carbapenem as initial therapy.      Carbapenem resistance IMP NOT DETECTED NOT DETECTED Final   Carbapenem resistance KPC NOT DETECTED NOT DETECTED Final   Carbapenem resistance NDM NOT DETECTED NOT DETECTED Final   Carbapenem resist OXA 48 LIKE NOT DETECTED NOT DETECTED Final   Carbapenem resistance VIM NOT DETECTED NOT DETECTED Final    Comment: Performed at [facility], [place] [address], [place], [place] [address]  Blood Culture (routine x 2)     Status: Abnormal   Collection Time: 02/23/21  4:24 PM   Specimen: BLOOD  Result Value Ref Range Status   Specimen Description   Final    BLOOD LEFT ANTECUBITAL Performed at [place] [address] [place], [place] [address]    Special Requests   Final    BOTTLES DRAWN AEROBIC AND ANAEROBIC Blood Culture adequate volume  Performed at [facility], [place] [address], [place], [place] [address]    Culture  Setup Time   Final    GRAM NEGATIVE RODS AEROBIC BOTTLE ONLY CRITICAL VALUE NOTED.  VALUE IS CONSISTENT WITH PREVIOUSLY REPORTED AND CALLED VALUE.    Culture (A)  Final    KLEBSIELLA PNEUMONIAE SUSCEPTIBILITIES PERFORMED ON PREVIOUS CULTURE WITHIN THE LAST 5 DAYS. Performed at [facility], [place] [address] [place], [place] [address]    Report Status 02/26/2021 FINAL  Final  Resp Panel by RT-PCR (Flu A&B, Covid) Nasopharyngeal Swab     Status: None   Collection Time: 02/23/21  4:44 PM   Specimen: Nasopharyngeal Swab; Nasopharyngeal(NP) swabs in vial transport medium  Result Value Ref Range Status   SARS Coronavirus 2 by RT PCR NEGATIVE NEGATIVE Final    Comment: (NOTE) SARS-CoV-2 target nucleic acids are NOT DETECTED.  The SARS-CoV-2 RNA is generally detectable in upper respiratory specimens during the acute phase of infection. The lowest concentration of SARS-CoV-2 viral copies this assay can detect is 138 copies/mL. A negative result does not preclude  SARS-Cov-2 infection and should not be used as the sole basis for treatment or other patient management decisions. A negative result may occur with  improper specimen collection/handling, submission of specimen other than nasopharyngeal swab, presence of viral mutation(s) within the areas targeted by this assay, and inadequate number of viral copies(<138 copies/mL). A negative result must be combined with clinical observations, patient history, and epidemiological information. The expected result is Negative.  Fact Sheet for Patients:  EntrepreneurPulse.com.au  Fact Sheet for Healthcare Providers:  IncredibleEmployment.be  This test is no t yet approved or cleared by the [place] FDA and  has been authorized for detection and/or diagnosis of SARS-CoV-2 by FDA under an Emergency Use Authorization (EUA). This EUA will remain  in effect (meaning this test can be used) for the duration of the COVID-19 declaration under Section 564(b)(1) of the Act, 21 U.S.C.section 360bbb-3(b)(1), unless the authorization is terminated  or revoked sooner.       Influenza A by PCR NEGATIVE NEGATIVE Final   Influenza B by PCR NEGATIVE NEGATIVE Final    Comment: (NOTE) The Xpert Xpress SARS-CoV-2/FLU/RSV plus assay is intended as an aid in the diagnosis of influenza from Nasopharyngeal swab specimens and should not be used as a sole basis for treatment. Nasal washings and aspirates are unacceptable for Xpert Xpress SARS-CoV-2/FLU/RSV testing.  Fact Sheet for Patients: EntrepreneurPulse.com.au  Fact Sheet for Healthcare Providers: IncredibleEmployment.be  This test is not yet approved or cleared by the [place] FDA and has been authorized for detection and/or diagnosis of SARS-CoV-2 by FDA under an Emergency Use Authorization (EUA). This EUA will remain in effect (meaning this test can be used) for the duration of  the COVID-19 declaration under Section 564(b)(1) of the Act, 21 U.S.C. section 360bbb-3(b)(1), unless the authorization is terminated or revoked.  Performed at [facility], [place] [address], [place], [place] [address]   Urine Culture     Status: Abnormal   Collection Time: 02/23/21  4:54 PM   Specimen: In/Out Cath Urine  Result Value Ref Range Status   Specimen Description   Final    IN/OUT CATH URINE Performed at [facility] [place] [address] [place], [place] [address]    Special Requests   Final    NONE Performed at [facility], [place] [address] [place], [place] [address]    Culture (  A)  Final    20,000 COLONIES/mL KLEBSIELLA PNEUMONIAE Confirmed Extended Spectrum Beta-Lactamase Producer (ESBL).  In bloodstream infections from ESBL organisms, carbapenems are preferred over piperacillin/tazobactam. They are shown to have a lower risk of mortality. 20,000 COLONIES/mL ENTEROCOCCUS FAECALIS    Report Status 02/26/2021 FINAL  Final   Organism ID, Bacteria KLEBSIELLA PNEUMONIAE (A)  Final   Organism ID, Bacteria ENTEROCOCCUS FAECALIS (A)  Final      Susceptibility   Enterococcus faecalis - MIC*    AMPICILLIN <=2 SENSITIVE Sensitive     NITROFURANTOIN <=16 SENSITIVE Sensitive     VANCOMYCIN 2 SENSITIVE Sensitive     * 20,000 COLONIES/mL ENTEROCOCCUS FAECALIS   Klebsiella pneumoniae - MIC*    AMPICILLIN >=32 RESISTANT Resistant     CEFAZOLIN >=64 RESISTANT Resistant     CEFEPIME >=32 RESISTANT Resistant     CEFTRIAXONE >=64 RESISTANT Resistant     CIPROFLOXACIN 2 INTERMEDIATE Intermediate     GENTAMICIN >=16 RESISTANT Resistant     IMIPENEM <=0.25 SENSITIVE Sensitive     NITROFURANTOIN 128 RESISTANT Resistant     TRIMETH/SULFA >=320 RESISTANT Resistant     AMPICILLIN/SULBACTAM >=32 RESISTANT Resistant     PIP/TAZO 16 SENSITIVE Sensitive     * 20,000 COLONIES/mL KLEBSIELLA PNEUMONIAE  MRSA Next Gen by PCR, Nasal      Status: None   Collection Time: 02/26/21 10:00 PM   Specimen: Nasal Mucosa; Nasal Swab  Result Value Ref Range Status   MRSA by PCR Next Gen NOT DETECTED NOT DETECTED Final    Comment: (NOTE) The GeneXpert MRSA Assay (FDA approved for NASAL specimens only), is one component of a comprehensive MRSA colonization surveillance program. It is not intended to diagnose MRSA infection nor to guide or monitor treatment for MRSA infections. Test performance is not FDA approved in patients less than 30 years old. Performed at [facility], [place] [address], [place], [place] [address]      Time coordinating discharge: 40 minutes  SIGNED:   Elmarie Shiley, MD  [facility]

## 2021-03-04 NOTE — TOC Transition Note (Addendum)
Transition of Care [facility]) - CM/SW Discharge Note   Patient Details  Name: ILYA NEELY MRN: [id number] Date of Birth: Nov 09, 1950  [facility] CM/SW Contact:  Leeroy Cha, RN Phone Number: 03/04/2021, 3:00 PM   Clinical Narrative:    Hhc sent to [facility] well accepted referral  Referral for shower chair sent to [employer]  Final next level of care: Home w Home Health Services Barriers to Discharge: Barriers Resolved   Patient Goals and CMS Choice Patient states their goals for this hospitalization and ongoing recovery are:: to go home CMS Medicare.gov Compare Post Acute Care list provided to:: Patient Choice offered to / list presented to : Patient  Discharge Placement                       Discharge Plan and Services   Discharge Planning Services: CM Consult Post Acute Care Choice: Home Health                    HH Arranged: PT, OT [facility] Agency: [place] Date [place]: 03/04/21 Time HH Agency Contacted: 1500 Representative spoke with at Rosa Sanchez: [place]) Interventions     Readmission Risk Interventions Readmission Risk Prevention Plan 03/04/2021  Transportation Screening Not Complete  PCP or Specialist Appt within 5-7 Days Not Complete  Home Care Screening Not Complete  Medication Review (RN CM) Not Complete  Some recent data might be hidden

## 2021-03-05 DIAGNOSIS — Z959 Presence of cardiac and vascular implant and graft, unspecified: Secondary | ICD-10-CM | POA: Diagnosis not present

## 2021-03-06 DIAGNOSIS — Z959 Presence of cardiac and vascular implant and graft, unspecified: Secondary | ICD-10-CM | POA: Diagnosis not present

## 2021-03-07 ENCOUNTER — Other Ambulatory Visit: Payer: Self-pay

## 2021-03-07 NOTE — Patient Outreach (Signed)
[facility]) Care Management  03/07/2021  RAYDER SULLENGER February 27, 1951 [id number]   Referral Date: 03/07/21 Referral Source: [employer] Report Date of Discharge: 03/07/21 Facility:  [place]   Referral received.  Transition of Care calls being completed via EMMI-automated calls.     Plan: RN CM will close case.    Jone Baseman, RN, MSN [facility] Care Management Care Management Coordinator Direct Line [phone] Toll Free: [phone]  Fax: [phone]

## 2021-03-08 DIAGNOSIS — N200 Calculus of kidney: Secondary | ICD-10-CM | POA: Diagnosis not present

## 2021-03-10 DIAGNOSIS — F411 Generalized anxiety disorder: Secondary | ICD-10-CM | POA: Diagnosis not present

## 2021-03-11 DIAGNOSIS — D649 Anemia, unspecified: Secondary | ICD-10-CM | POA: Diagnosis not present

## 2021-03-11 DIAGNOSIS — I4891 Unspecified atrial fibrillation: Secondary | ICD-10-CM | POA: Diagnosis not present

## 2021-03-12 DIAGNOSIS — A4181 Sepsis due to Enterococcus: Secondary | ICD-10-CM | POA: Diagnosis not present

## 2021-03-12 DIAGNOSIS — N390 Urinary tract infection, site not specified: Secondary | ICD-10-CM | POA: Diagnosis not present

## 2021-03-12 DIAGNOSIS — I110 Hypertensive heart disease with heart failure: Secondary | ICD-10-CM | POA: Diagnosis not present

## 2021-03-12 DIAGNOSIS — A4159 Other Gram-negative sepsis: Secondary | ICD-10-CM | POA: Diagnosis not present

## 2021-03-12 DIAGNOSIS — J80 Acute respiratory distress syndrome: Secondary | ICD-10-CM | POA: Diagnosis not present

## 2021-03-12 DIAGNOSIS — N179 Acute kidney failure, unspecified: Secondary | ICD-10-CM | POA: Diagnosis not present

## 2021-03-12 DIAGNOSIS — R6520 Severe sepsis without septic shock: Secondary | ICD-10-CM | POA: Diagnosis not present

## 2021-03-12 DIAGNOSIS — E1136 Type 2 diabetes mellitus with diabetic cataract: Secondary | ICD-10-CM | POA: Diagnosis not present

## 2021-03-12 DIAGNOSIS — I5031 Acute diastolic (congestive) heart failure: Secondary | ICD-10-CM | POA: Diagnosis not present

## 2021-03-14 DIAGNOSIS — A4181 Sepsis due to Enterococcus: Secondary | ICD-10-CM | POA: Diagnosis not present

## 2021-03-14 DIAGNOSIS — N390 Urinary tract infection, site not specified: Secondary | ICD-10-CM | POA: Diagnosis not present

## 2021-03-14 DIAGNOSIS — I5031 Acute diastolic (congestive) heart failure: Secondary | ICD-10-CM | POA: Diagnosis not present

## 2021-03-14 DIAGNOSIS — I110 Hypertensive heart disease with heart failure: Secondary | ICD-10-CM | POA: Diagnosis not present

## 2021-03-14 DIAGNOSIS — E1136 Type 2 diabetes mellitus with diabetic cataract: Secondary | ICD-10-CM | POA: Diagnosis not present

## 2021-03-14 DIAGNOSIS — J80 Acute respiratory distress syndrome: Secondary | ICD-10-CM | POA: Diagnosis not present

## 2021-03-14 DIAGNOSIS — R6520 Severe sepsis without septic shock: Secondary | ICD-10-CM | POA: Diagnosis not present

## 2021-03-14 DIAGNOSIS — A4159 Other Gram-negative sepsis: Secondary | ICD-10-CM | POA: Diagnosis not present

## 2021-03-14 DIAGNOSIS — N179 Acute kidney failure, unspecified: Secondary | ICD-10-CM | POA: Diagnosis not present

## 2021-03-15 DIAGNOSIS — N3000 Acute cystitis without hematuria: Secondary | ICD-10-CM | POA: Diagnosis not present

## 2021-03-18 DIAGNOSIS — J80 Acute respiratory distress syndrome: Secondary | ICD-10-CM | POA: Diagnosis not present

## 2021-03-18 DIAGNOSIS — N390 Urinary tract infection, site not specified: Secondary | ICD-10-CM | POA: Diagnosis not present

## 2021-03-18 DIAGNOSIS — I5031 Acute diastolic (congestive) heart failure: Secondary | ICD-10-CM | POA: Diagnosis not present

## 2021-03-18 DIAGNOSIS — R6520 Severe sepsis without septic shock: Secondary | ICD-10-CM | POA: Diagnosis not present

## 2021-03-18 DIAGNOSIS — I110 Hypertensive heart disease with heart failure: Secondary | ICD-10-CM | POA: Diagnosis not present

## 2021-03-18 DIAGNOSIS — N179 Acute kidney failure, unspecified: Secondary | ICD-10-CM | POA: Diagnosis not present

## 2021-03-18 DIAGNOSIS — A4181 Sepsis due to Enterococcus: Secondary | ICD-10-CM | POA: Diagnosis not present

## 2021-03-18 DIAGNOSIS — E1136 Type 2 diabetes mellitus with diabetic cataract: Secondary | ICD-10-CM | POA: Diagnosis not present

## 2021-03-18 DIAGNOSIS — A4159 Other Gram-negative sepsis: Secondary | ICD-10-CM | POA: Diagnosis not present

## 2021-03-29 DIAGNOSIS — F411 Generalized anxiety disorder: Secondary | ICD-10-CM | POA: Diagnosis not present

## 2021-03-30 DIAGNOSIS — I5031 Acute diastolic (congestive) heart failure: Secondary | ICD-10-CM | POA: Diagnosis not present

## 2021-03-30 DIAGNOSIS — N390 Urinary tract infection, site not specified: Secondary | ICD-10-CM | POA: Diagnosis not present

## 2021-03-30 DIAGNOSIS — N179 Acute kidney failure, unspecified: Secondary | ICD-10-CM | POA: Diagnosis not present

## 2021-03-30 DIAGNOSIS — I110 Hypertensive heart disease with heart failure: Secondary | ICD-10-CM | POA: Diagnosis not present

## 2021-03-30 DIAGNOSIS — A4181 Sepsis due to Enterococcus: Secondary | ICD-10-CM | POA: Diagnosis not present

## 2021-03-30 DIAGNOSIS — E1136 Type 2 diabetes mellitus with diabetic cataract: Secondary | ICD-10-CM | POA: Diagnosis not present

## 2021-03-30 DIAGNOSIS — A4159 Other Gram-negative sepsis: Secondary | ICD-10-CM | POA: Diagnosis not present

## 2021-03-30 DIAGNOSIS — R6520 Severe sepsis without septic shock: Secondary | ICD-10-CM | POA: Diagnosis not present

## 2021-03-30 DIAGNOSIS — J80 Acute respiratory distress syndrome: Secondary | ICD-10-CM | POA: Diagnosis not present

## 2021-04-03 DIAGNOSIS — D649 Anemia, unspecified: Secondary | ICD-10-CM | POA: Diagnosis not present

## 2021-04-03 DIAGNOSIS — R531 Weakness: Secondary | ICD-10-CM | POA: Diagnosis not present

## 2021-04-03 DIAGNOSIS — I4891 Unspecified atrial fibrillation: Secondary | ICD-10-CM | POA: Diagnosis not present

## 2021-04-03 DIAGNOSIS — N209 Urinary calculus, unspecified: Secondary | ICD-10-CM | POA: Diagnosis not present

## 2021-04-14 DIAGNOSIS — N200 Calculus of kidney: Secondary | ICD-10-CM | POA: Diagnosis not present

## 2021-04-14 DIAGNOSIS — N3000 Acute cystitis without hematuria: Secondary | ICD-10-CM | POA: Diagnosis not present

## 2021-05-16 DIAGNOSIS — Z1331 Encounter for screening for depression: Secondary | ICD-10-CM | POA: Diagnosis not present

## 2021-05-16 DIAGNOSIS — E1140 Type 2 diabetes mellitus with diabetic neuropathy, unspecified: Secondary | ICD-10-CM | POA: Diagnosis not present

## 2021-05-16 DIAGNOSIS — D519 Vitamin B12 deficiency anemia, unspecified: Secondary | ICD-10-CM | POA: Diagnosis not present

## 2021-05-16 DIAGNOSIS — I2510 Atherosclerotic heart disease of native coronary artery without angina pectoris: Secondary | ICD-10-CM | POA: Diagnosis not present

## 2021-05-16 DIAGNOSIS — Z125 Encounter for screening for malignant neoplasm of prostate: Secondary | ICD-10-CM | POA: Diagnosis not present

## 2021-05-16 DIAGNOSIS — G2581 Restless legs syndrome: Secondary | ICD-10-CM | POA: Diagnosis not present

## 2021-05-16 DIAGNOSIS — E1159 Type 2 diabetes mellitus with other circulatory complications: Secondary | ICD-10-CM | POA: Diagnosis not present

## 2021-05-16 DIAGNOSIS — I4891 Unspecified atrial fibrillation: Secondary | ICD-10-CM | POA: Diagnosis not present

## 2021-05-16 DIAGNOSIS — F330 Major depressive disorder, recurrent, mild: Secondary | ICD-10-CM | POA: Diagnosis not present

## 2021-05-16 DIAGNOSIS — Z6826 Body mass index (BMI) 26.0-26.9, adult: Secondary | ICD-10-CM | POA: Diagnosis not present

## 2021-05-16 DIAGNOSIS — Z0000 Encounter for general adult medical examination without abnormal findings: Secondary | ICD-10-CM | POA: Diagnosis not present

## 2021-05-16 DIAGNOSIS — E7800 Pure hypercholesterolemia, unspecified: Secondary | ICD-10-CM | POA: Diagnosis not present

## 2021-05-16 DIAGNOSIS — I700 Atherosclerosis of aorta: Secondary | ICD-10-CM | POA: Diagnosis not present

## 2021-05-17 DIAGNOSIS — N411 Chronic prostatitis: Secondary | ICD-10-CM | POA: Diagnosis not present

## 2021-05-17 DIAGNOSIS — R3913 Splitting of urinary stream: Secondary | ICD-10-CM | POA: Diagnosis not present

## 2021-06-07 DIAGNOSIS — Z959 Presence of cardiac and vascular implant and graft, unspecified: Secondary | ICD-10-CM | POA: Diagnosis not present

## 2021-06-27 DIAGNOSIS — N401 Enlarged prostate with lower urinary tract symptoms: Secondary | ICD-10-CM | POA: Diagnosis not present

## 2021-06-27 DIAGNOSIS — E119 Type 2 diabetes mellitus without complications: Secondary | ICD-10-CM | POA: Diagnosis not present

## 2021-06-27 DIAGNOSIS — N411 Chronic prostatitis: Secondary | ICD-10-CM | POA: Diagnosis not present

## 2021-06-27 DIAGNOSIS — Z5189 Encounter for other specified aftercare: Secondary | ICD-10-CM | POA: Diagnosis not present

## 2021-07-01 ENCOUNTER — Other Ambulatory Visit: Payer: Self-pay | Admitting: Urology

## 2021-07-06 ENCOUNTER — Other Ambulatory Visit: Payer: Self-pay | Admitting: Physical Medicine & Rehabilitation

## 2021-07-06 ENCOUNTER — Other Ambulatory Visit (HOSPITAL_COMMUNITY): Payer: Self-pay | Admitting: Physical Medicine & Rehabilitation

## 2021-07-06 DIAGNOSIS — M545 Low back pain, unspecified: Secondary | ICD-10-CM | POA: Diagnosis not present

## 2021-07-06 DIAGNOSIS — M5410 Radiculopathy, site unspecified: Secondary | ICD-10-CM

## 2021-07-07 DIAGNOSIS — M5116 Intervertebral disc disorders with radiculopathy, lumbar region: Secondary | ICD-10-CM | POA: Diagnosis not present

## 2021-07-07 DIAGNOSIS — M9953 Intervertebral disc stenosis of neural canal of lumbar region: Secondary | ICD-10-CM | POA: Diagnosis not present

## 2021-07-13 NOTE — Progress Notes (Addendum)
COVID swab appointment: n/a  COVID Vaccine Completed: yes x3 Date COVID Vaccine completed: 08/26/19. 09/28/19 Has received booster: 06/09/20 COVID vaccine manufacturer: Pfizer    Moderna     Date of COVID positive in last 90 days: mid November per pt. Used home test  PCP - Townsend Roger, MD Cardiologist - Thad Ranger, MD  Chest x-ray - 03/01/21 Epic EKG - 02/28/21 Epic Stress Test - n/a ECHO - 02/28/21 Epic Cardiac Cath - 2015 Pacemaker/ICD device last checked:n/a Spinal Cord Stimulator: n/a  Sleep Study - yes, positive CPAP - not since lost weight   Fasting Blood Sugar - 140-170 Checks Blood Sugar x3 per week  Blood Thinner Instructions: plavix, hold 5 day Aspirin Instructions:ASA 81, hold 5 days Last Dose: 07/16/21  Activity level: Can go up a flight of stairs and perform activities of daily living without stopping and without symptoms of chest pain or shortness of breath.      Anesthesia review: NSTEMI, a fib, CAD, cardiomyopathy, HTN, OSA, cirrhosis, DM 2, anemia, hospitalized for sepsis 2/2 UTI 02/23/21, BS 269 at PAT  Patient denies shortness of breath, fever, cough and chest pain at PAT appointment   Patient verbalized understanding of instructions that were given to them at the PAT appointment. Patient was also instructed that they will need to review over the PAT instructions again at home before surgery.

## 2021-07-14 ENCOUNTER — Encounter (HOSPITAL_COMMUNITY): Payer: Self-pay

## 2021-07-14 ENCOUNTER — Other Ambulatory Visit: Payer: Self-pay

## 2021-07-14 ENCOUNTER — Encounter (HOSPITAL_COMMUNITY)
Admission: RE | Admit: 2021-07-14 | Discharge: 2021-07-14 | Disposition: A | Discharge status: Home / Self Care | Payer: Medicare HMO | Source: Ambulatory Visit | Attending: Urology | Admitting: Urology

## 2021-07-14 VITALS — BP 126/70 | HR 104 | Temp 98.5°F | Resp 14 | Ht 67.0 in | Wt 166.2 lb

## 2021-07-14 DIAGNOSIS — I255 Ischemic cardiomyopathy: Secondary | ICD-10-CM | POA: Insufficient documentation

## 2021-07-14 DIAGNOSIS — A499 Bacterial infection, unspecified: Secondary | ICD-10-CM

## 2021-07-14 DIAGNOSIS — I252 Old myocardial infarction: Secondary | ICD-10-CM | POA: Diagnosis not present

## 2021-07-14 DIAGNOSIS — N418 Other inflammatory diseases of prostate: Secondary | ICD-10-CM | POA: Insufficient documentation

## 2021-07-14 DIAGNOSIS — K7581 Nonalcoholic steatohepatitis (NASH): Secondary | ICD-10-CM | POA: Diagnosis not present

## 2021-07-14 DIAGNOSIS — K7460 Unspecified cirrhosis of liver: Secondary | ICD-10-CM | POA: Insufficient documentation

## 2021-07-14 DIAGNOSIS — Z87891 Personal history of nicotine dependence: Secondary | ICD-10-CM | POA: Insufficient documentation

## 2021-07-14 DIAGNOSIS — Z7902 Long term (current) use of antithrombotics/antiplatelets: Secondary | ICD-10-CM | POA: Insufficient documentation

## 2021-07-14 DIAGNOSIS — I2510 Atherosclerotic heart disease of native coronary artery without angina pectoris: Secondary | ICD-10-CM | POA: Insufficient documentation

## 2021-07-14 DIAGNOSIS — I4891 Unspecified atrial fibrillation: Secondary | ICD-10-CM | POA: Diagnosis not present

## 2021-07-14 DIAGNOSIS — E119 Type 2 diabetes mellitus without complications: Secondary | ICD-10-CM | POA: Diagnosis not present

## 2021-07-14 DIAGNOSIS — Z01812 Encounter for preprocedural laboratory examination: Secondary | ICD-10-CM | POA: Diagnosis not present

## 2021-07-14 DIAGNOSIS — Z955 Presence of coronary angioplasty implant and graft: Secondary | ICD-10-CM | POA: Diagnosis not present

## 2021-07-14 HISTORY — DX: Personal history of urinary calculi: Z87.442

## 2021-07-14 LAB — COMPREHENSIVE METABOLIC PANEL
ALT: 13 U/L (ref 0–44)
AST: 18 U/L (ref 15–41)
Albumin: 4 g/dL (ref 3.5–5.0)
Alkaline Phosphatase: 54 U/L (ref 38–126)
Anion gap: 9 (ref 5–15)
BUN: 20 mg/dL (ref 8–23)
CO2: 25 mmol/L (ref 22–32)
Calcium: 9.4 mg/dL (ref 8.9–10.3)
Chloride: 105 mmol/L (ref 98–111)
Creatinine, Ser: 1.12 mg/dL (ref 0.61–1.24)
GFR, Estimated: >60 mL/min (ref >60)
Glucose, Bld: 262 mg/dL — ABNORMAL HIGH (ref 70–99)
Potassium: 4.3 mmol/L (ref 3.5–5.1)
Sodium: 139 mmol/L (ref 135–145)
Total Bilirubin: 0.7 mg/dL (ref 0.3–1.2)
Total Protein: 6.8 g/dL (ref 6.5–8.1)

## 2021-07-14 LAB — CBC
HCT: 31.9 % — ABNORMAL LOW (ref 39.0–52.0)
Hemoglobin: 11 g/dL — ABNORMAL LOW (ref 13.0–17.0)
MCH: 33.3 pg (ref 26.0–34.0)
MCHC: 34.5 g/dL (ref 30.0–36.0)
MCV: 96.7 fL (ref 80.0–100.0)
Platelets: 172 10*3/uL (ref 150–400)
RBC: 3.3 MIL/uL — ABNORMAL LOW (ref 4.22–5.81)
RDW: 15.5 % (ref 11.5–15.5)
WBC: 5.2 10*3/uL (ref 4.0–10.5)
nRBC: 0 % (ref 0.0–0.2)

## 2021-07-14 LAB — GLUCOSE, CAPILLARY: Glucose-Capillary: 269 mg/dL — ABNORMAL HIGH (ref 70–99)

## 2021-07-14 NOTE — Patient Instructions (Addendum)
DUE TO COVID-19 ONLY ONE VISITOR IS ALLOWED TO COME WITH YOU AND STAY IN THE WAITING ROOM ONLY DURING PRE OP AND PROCEDURE.   **NO VISITORS ARE ALLOWED IN THE SHORT STAY AREA OR RECOVERY ROOM!!**       Your procedure is scheduled on: 07/21/21   Report to [facility] Main Entrance    Report to admitting at 9:10 AM   Call this number if you have problems the morning of surgery [phone]   Do not eat food :After Midnight.   May have liquids until 8:30 AM day of surgery  CLEAR LIQUID DIET  Foods Allowed                                                                     Foods Excluded  Water, Black Coffee and tea (no milk or creamer)           liquids that you cannot  Plain Jell-O in any flavor  (No red)                                    see through such as: Fruit ices (not with fruit pulp)                                            milk, soups, orange juice              Iced Popsicles (No red)                                               All solid food                                   Apple juices Sports drinks like Gatorade (No red) Lightly seasoned clear broth or consume(fat free) Sugar   Oral Hygiene is also important to reduce your risk of infection.                                    Remember - BRUSH YOUR TEETH THE MORNING OF SURGERY WITH YOUR REGULAR TOOTHPASTE   Stop all vitamins and supplements 7 days before surgery   Follow instructions given to you regarding Aspirin and Plavix before surgery   Take these medicines the morning of surgery with A SIP OF WATER: Wellbutrin, Zyrtec, Diltiazem, Gabapentin, Protonix  DO NOT TAKE ANY ORAL DIABETIC MEDICATIONS DAY OF YOUR SURGERY  How to Manage Your Diabetes Before and After Surgery  Why is it important to control my blood sugar before and after surgery? Improving blood sugar levels before and after surgery helps healing and can limit problems. A way of improving blood sugar control is eating a healthy diet by:   Eating less sugar and carbohydrates  Increasing activity/exercise  Talking with your  doctor about reaching your blood sugar goals High blood sugars (greater than 180 mg/dL) can raise your risk of infections and slow your recovery, so you will need to focus on controlling your diabetes during the weeks before surgery. Make sure that the doctor who takes care of your diabetes knows about your planned surgery including the date and location.  How do I manage my blood sugar before surgery? Check your blood sugar at least 4 times a day, starting 2 days before surgery, to make sure that the level is not too high or low. Check your blood sugar the morning of your surgery when you wake up and every 2 hours until you get to the Short Stay unit. If your blood sugar is less than 70 mg/dL, you will need to treat for low blood sugar: Do not take insulin. Treat a low blood sugar (less than 70 mg/dL) with  cup of clear juice (cranberry or apple), 4 glucose tablets, OR glucose gel. Recheck blood sugar in 15 minutes after treatment (to make sure it is greater than 70 mg/dL). If your blood sugar is not greater than 70 mg/dL on recheck, call [phone] for further instructions. Report your blood sugar to the short stay nurse when you get to Short Stay.  If you are admitted to the hospital after surgery: Your blood sugar will be checked by the staff and you will probably be given insulin after surgery (instead of oral diabetes medicines) to make sure you have good blood sugar levels. The goal for blood sugar control after surgery is 80-180 mg/dL.   WHAT DO I DO ABOUT MY DIABETES MEDICATION?  Do not take oral diabetes medicines (pills) the morning of surgery.  THE DAY BEFORE SURGERY, take Metformin and ACTOS as prescribed       THE MORNING OF SURGERY, do not take Metformin or ACTOS  Reviewed and Endorsed by [facility] Patient Education Committee, August 2015                               You may not  have any metal on your body including jewelry, and body piercing             Do not wear lotions, powders, cologne, or deodorant              Men may shave face and neck.   Do not bring valuables to the hospital. [place].   Contacts, dentures or bridgework may not be worn into surgery.    Patients discharged on the day of surgery will not be allowed to drive home.  Someone needs to stay with you for the first 24 hours after anesthesia.              Please read over the following fact sheets you were given: IF YOU HAVE QUESTIONS ABOUT YOUR PRE-OP INSTRUCTIONS PLEASE CALL [place] - Preparing for Surgery Before surgery, you can play an important role.  Because skin is not sterile, your skin needs to be as free of germs as possible.  You can reduce the number of germs on your skin by washing with CHG (chlorahexidine gluconate) soap before surgery.  CHG is an antiseptic cleaner which kills germs and bonds with the skin to continue killing germs even after washing. Please  DO NOT use if you have an allergy to CHG or antibacterial soaps.  If your skin becomes reddened/irritated stop using the CHG and inform your nurse when you arrive at Short Stay. Do not shave (including legs and underarms) for at least 48 hours prior to the first CHG shower.  You may shave your face/neck.  Please follow these instructions carefully:  1.  Shower with CHG Soap the night before surgery and the  morning of surgery.  2.  If you choose to wash your hair, wash your hair first as usual with your normal  shampoo.  3.  After you shampoo, rinse your hair and body thoroughly to remove the shampoo.                             4.  Use CHG as you would any other liquid soap.  You can apply chg directly to the skin and wash.  Gently with a scrungie or clean washcloth.  5.  Apply the CHG Soap to your body ONLY FROM THE NECK DOWN.   Do   not use on  face/ open                           Wound or open sores. Avoid contact with eyes, ears mouth and   genitals (private parts).                       Wash face,  Genitals (private parts) with your normal soap.             6.  Wash thoroughly, paying special attention to the area where your    surgery  will be performed.  7.  Thoroughly rinse your body with warm water from the neck down.  8.  DO NOT shower/wash with your normal soap after using and rinsing off the CHG Soap.                9.  Pat yourself dry with a clean towel.            10.  Wear clean pajamas.            11.  Place clean sheets on your bed the night of your first shower and do not  sleep with pets. Day of Surgery : Do not apply any lotions/deodorants the morning of surgery.  Please wear clean clothes to the hospital/surgery center.  FAILURE TO FOLLOW THESE INSTRUCTIONS MAY RESULT IN THE CANCELLATION OF YOUR SURGERY  PATIENT SIGNATURE_________________________________  NURSE SIGNATURE__________________________________  ________________________________________________________________________

## 2021-07-15 DIAGNOSIS — M9953 Intervertebral disc stenosis of neural canal of lumbar region: Secondary | ICD-10-CM | POA: Diagnosis not present

## 2021-07-15 DIAGNOSIS — M5116 Intervertebral disc disorders with radiculopathy, lumbar region: Secondary | ICD-10-CM | POA: Diagnosis not present

## 2021-07-15 NOTE — Progress Notes (Signed)
Anesthesia Chart Review   Case: [id number] Date/Time: 07/21/21 1115   Procedure: CYSTOSCOPY WITH RETROGRADE URETHROGRAM POSSIBLE URETHRAL BALLOON DILATION - 30 MINS   Anesthesia type: General   Pre-op diagnosis: WEAL STREAM PROSTATITIS   Location: [place] [facility]   Surgeons: [name], MD       DISCUSSION:71 y.o. former smoker with h/o atrial fibrillation, CAD s/p NSTEMI 06/27/2013 DES to RCA, ischemic cardiomyopathy, DM II (A1C 6.7), NASH, weal stream prostatitis scheduled for above procedure 07/21/21 with Dr. Louis Meckel.   Pt last seen by cardiology 01/11/2021. Per note pt is active, walks every day for 30 min without sx.   Clearance on chart which states, "Our mutual patient Kristopher [name] is scheduled for a urethral dilatation on 07/21/21. Upon review with his cardiologist Dr. Thad Ranger, Kristopher [name] is clear to hold his Plavix for five days prior to his procedure and should resume as soon as possible post-procedure once hemostasis is achieved."  Anticipate pt can proceed with planned procedure barring acute status change.   VS: BP 126/70    Pulse (!) 104    Temp 36.9 C (Oral)    Resp 14    Ht _0  (1.702 m)    Wt 75.4 kg    SpO2 98%    BMI 26.03 kg/m   PROVIDERS: Townsend Roger, MD is PCP   Sheliah Plane, MD is Cardiologist  LABS: Labs reviewed: Acceptable for surgery. (all labs ordered are listed, but only abnormal results are displayed)  Labs Reviewed  COMPREHENSIVE METABOLIC PANEL - Abnormal; Notable for the following components:      Result Value   Glucose, Bld 262 (*)    All other components within normal limits  CBC - Abnormal; Notable for the following components:   RBC 3.30 (*)    Hemoglobin 11.0 (*)    HCT 31.9 (*)    All other components within normal limits  GLUCOSE, CAPILLARY - Abnormal; Notable for the following components:   Glucose-Capillary 269 (*)    All other components within normal limits     IMAGES:   EKG:   CV:  Past Medical  History:  Diagnosis Date   Anemia 12/2020   Anginal pain (HCC)    Atrial fibrillation ([place])    a. s/p PVI X2 by Dr Omelia Blackwater at [facility] around 2000.   CAD (coronary artery disease)    a. NSTEMI (06/2013):  LHC (06/27/2013):  pLAD 10-20, RCA 90, EF 45-50%, inf HK.  PCI:  Xience Alpine (3 x 23 mm) DES to the RCA. b. Relook cath 07/10/13 - patent stent. Imdur added. c. Recurrent CP after that - abnormal nuc 07/2017 with chest pain, dyspnea, hypotensive response. Rest images with mid-basal inferior thinning. No stress images due to abrupt d/c of test. Relook cath 3/12 - no significant r   Chronic back pain greater than 3 months duration    Cirrhosis, non-alcoholic (HCC)    Depression    DJD (degenerative joint disease) of cervical spine    "3 herniated discs" (09/01/2013)   DJD (degenerative joint disease), lumbosacral    "bulging L5-S1" (09/01/2013)   Dysrhythmia    PVCs   Herniated cervical disc    c2-T1 fusion   History of kidney stones    HLD (hyperlipidemia)    Ischemic cardiomyopathy    a. Echo (06/28/2011): Inferior and inferoseptal akinesis, EF 53%, grade 2 diastolic dysfunction, MAC, small effusion (no tamponade). B. EF 40% by echo 06/2013, cath  07/2013, 55% by cath 08/2013 with subtle mild mid inferior hypocontractility.   Liver cirrhosis secondary to NASH [facility])    NSTEMI (non-ST elevated myocardial infarction) ([place]) 06/27/2013   Osteoarthritis    "neck and lower back" (09/01/2013)   Pericardial effusion    a. Chronic since hx of pericarditis in 2004. b. Mod by echo 06/2013, small by echo 06/2013.   Pneumonia ~ 2005   Type II diabetes mellitus ([place])     Past Surgical History:  Procedure Laterality Date   APPENDECTOMY  ~ 1965   ATRIAL FIBRILLATION ABLATION  2000   Dr Omelia Blackwater at [place] N/A 10/07/2013   Procedure: LEFT AND RIGHT HEART CATHETERIZATION WITH CORONARY ANGIOGRAM;  Surgeon: Troy Sine, MD;  Location: [facility] CATH LAB;   Service: Cardiovascular;  Laterality: N/A;   LEFT HEART CATHETERIZATION WITH CORONARY ANGIOGRAM N/A 06/27/2013   Procedure: LEFT HEART CATHETERIZATION WITH CORONARY ANGIOGRAM;  Surgeon: Troy Sine, MD;  Location: [facility] CATH LAB;  Service: Cardiovascular;  Laterality: N/A;   LEFT HEART CATHETERIZATION WITH CORONARY ANGIOGRAM N/A 07/10/2013   Procedure: LEFT HEART CATHETERIZATION WITH CORONARY ANGIOGRAM;  Surgeon: Burnell Blanks, MD;  Location: [facility] CATH LAB;  Service: Cardiovascular;  Laterality: N/A;   LEFT HEART CATHETERIZATION WITH CORONARY ANGIOGRAM N/A 08/28/2013   Procedure: LEFT HEART CATHETERIZATION WITH CORONARY ANGIOGRAM;  Surgeon: Troy Sine, MD;  Location: [facility] CATH LAB;  Service: Cardiovascular;  Laterality: N/A;   NEPHROLITHOTOMY Left 02/11/2021   Procedure: LEFT NEPHROLITHOTOMY PERCUTANEOUS WITH SURGEON ACCESS;  Surgeon: [name], MD;  Location: [facility];  Service: Urology;  Laterality: Left;   PERCUTANEOUS CORONARY STENT INTERVENTION (PCI-S)  06/27/2013   Procedure: PERCUTANEOUS CORONARY STENT INTERVENTION (PCI-S);  Surgeon: Troy Sine, MD;  Location: [facility] CATH LAB;  Service: Cardiovascular;;   POSTERIOR CERVICAL FUSION/FORAMINOTOMY  2009   fusion c3-t1   PULMONARY VEIN ABLATION  2001   Dr Omelia Blackwater at [place]:  aspirin EC 81 MG tablet   baclofen (LIORESAL) 10 MG tablet   baclofen 5 MG TABS   buPROPion (WELLBUTRIN XL) 150 MG 24 hr tablet   cetirizine (ZYRTEC) 10 MG tablet   clopidogrel (PLAVIX) 75 MG tablet   [employer] MAGNESIUM 500 MG TABS   diltiazem (CARDIZEM SR) 120 MG 12 hr capsule   ferrous sulfate 325 (65 FE) MG tablet   gabapentin (NEURONTIN) 100 MG capsule   gabapentin (NEURONTIN) 600 MG tablet   ibuprofen (ADVIL) 200 MG tablet   meloxicam (MOBIC) 15 MG tablet   metFORMIN (GLUCOPHAGE-XR) 500 MG 24 hr tablet   metoprolol tartrate (LOPRESSOR) 50 MG tablet   Misc Natural Products (PUMPKIN SEED OIL) CAPS   Multiple  Vitamins-Iron (MULTIVITAMINS WITH IRON) TABS tablet   pantoprazole (PROTONIX) 40 MG tablet   pioglitazone (ACTOS) 15 MG tablet   promethazine (PHENERGAN) 25 MG tablet   rOPINIRole (REQUIP) 1 MG tablet   rosuvastatin (CRESTOR) 5 MG tablet   Saw Palmetto 450 MG CAPS   sucralfate (CARAFATE) 1 g tablet   tamsulosin (FLOMAX) 0.4 MG CAPS capsule   thiamine 100 MG tablet   No current facility-administered medications for this encounter.     Konrad Felix Ward, PA-C [facility] [phone]

## 2021-07-16 ENCOUNTER — Ambulatory Visit (HOSPITAL_COMMUNITY)
Admission: RE | Admit: 2021-07-16 | Discharge: 2021-07-16 | Disposition: A | Discharge status: Home / Self Care | Payer: Medicare HMO | Source: Ambulatory Visit | Attending: Physical Medicine & Rehabilitation | Admitting: Physical Medicine & Rehabilitation

## 2021-07-16 DIAGNOSIS — M5136 Other intervertebral disc degeneration, lumbar region: Secondary | ICD-10-CM | POA: Diagnosis not present

## 2021-07-16 DIAGNOSIS — M5410 Radiculopathy, site unspecified: Secondary | ICD-10-CM | POA: Insufficient documentation

## 2021-07-16 DIAGNOSIS — M4726 Other spondylosis with radiculopathy, lumbar region: Secondary | ICD-10-CM | POA: Diagnosis not present

## 2021-07-20 DIAGNOSIS — M5116 Intervertebral disc disorders with radiculopathy, lumbar region: Secondary | ICD-10-CM | POA: Diagnosis not present

## 2021-07-21 ENCOUNTER — Encounter (HOSPITAL_COMMUNITY): Payer: Self-pay | Admitting: Urology

## 2021-07-21 ENCOUNTER — Encounter (HOSPITAL_COMMUNITY)
Admission: RE | Disposition: A | Discharge status: Home / Self Care | Payer: Self-pay | Source: Home / Self Care | Attending: Urology

## 2021-07-21 ENCOUNTER — Ambulatory Visit (HOSPITAL_COMMUNITY): Payer: Medicare HMO | Admitting: Physician Assistant

## 2021-07-21 ENCOUNTER — Ambulatory Visit (HOSPITAL_COMMUNITY): Payer: Medicare HMO | Admitting: Anesthesiology

## 2021-07-21 ENCOUNTER — Ambulatory Visit (HOSPITAL_COMMUNITY)
Admission: RE | Admit: 2021-07-21 | Discharge: 2021-07-21 | Disposition: A | Discharge status: Home / Self Care | Payer: Medicare HMO | Attending: Urology | Admitting: Urology

## 2021-07-21 ENCOUNTER — Ambulatory Visit (HOSPITAL_COMMUNITY): Payer: Medicare HMO

## 2021-07-21 DIAGNOSIS — R3913 Splitting of urinary stream: Secondary | ICD-10-CM | POA: Insufficient documentation

## 2021-07-21 DIAGNOSIS — Z87440 Personal history of urinary (tract) infections: Secondary | ICD-10-CM | POA: Diagnosis not present

## 2021-07-21 DIAGNOSIS — E119 Type 2 diabetes mellitus without complications: Secondary | ICD-10-CM | POA: Diagnosis not present

## 2021-07-21 DIAGNOSIS — Z79899 Other long term (current) drug therapy: Secondary | ICD-10-CM | POA: Diagnosis not present

## 2021-07-21 DIAGNOSIS — N419 Inflammatory disease of prostate, unspecified: Secondary | ICD-10-CM | POA: Diagnosis not present

## 2021-07-21 DIAGNOSIS — D649 Anemia, unspecified: Secondary | ICD-10-CM | POA: Insufficient documentation

## 2021-07-21 DIAGNOSIS — N411 Chronic prostatitis: Secondary | ICD-10-CM | POA: Diagnosis not present

## 2021-07-21 DIAGNOSIS — G4730 Sleep apnea, unspecified: Secondary | ICD-10-CM | POA: Diagnosis not present

## 2021-07-21 DIAGNOSIS — Z87891 Personal history of nicotine dependence: Secondary | ICD-10-CM | POA: Insufficient documentation

## 2021-07-21 DIAGNOSIS — I4891 Unspecified atrial fibrillation: Secondary | ICD-10-CM | POA: Insufficient documentation

## 2021-07-21 DIAGNOSIS — N99110 Postprocedural urethral stricture, male, meatal: Secondary | ICD-10-CM

## 2021-07-21 DIAGNOSIS — I252 Old myocardial infarction: Secondary | ICD-10-CM | POA: Insufficient documentation

## 2021-07-21 DIAGNOSIS — Z7982 Long term (current) use of aspirin: Secondary | ICD-10-CM | POA: Insufficient documentation

## 2021-07-21 DIAGNOSIS — I10 Essential (primary) hypertension: Secondary | ICD-10-CM | POA: Diagnosis not present

## 2021-07-21 DIAGNOSIS — R3912 Poor urinary stream: Secondary | ICD-10-CM | POA: Insufficient documentation

## 2021-07-21 DIAGNOSIS — N35911 Unspecified urethral stricture, male, meatal: Secondary | ICD-10-CM | POA: Diagnosis not present

## 2021-07-21 DIAGNOSIS — K759 Inflammatory liver disease, unspecified: Secondary | ICD-10-CM | POA: Diagnosis not present

## 2021-07-21 DIAGNOSIS — I2510 Atherosclerotic heart disease of native coronary artery without angina pectoris: Secondary | ICD-10-CM | POA: Diagnosis not present

## 2021-07-21 DIAGNOSIS — Z791 Long term (current) use of non-steroidal anti-inflammatories (NSAID): Secondary | ICD-10-CM | POA: Diagnosis not present

## 2021-07-21 DIAGNOSIS — Z955 Presence of coronary angioplasty implant and graft: Secondary | ICD-10-CM | POA: Insufficient documentation

## 2021-07-21 HISTORY — PX: CYSTOSCOPY WITH RETROGRADE URETHROGRAM: SHX6309

## 2021-07-21 LAB — GLUCOSE, CAPILLARY
Glucose-Capillary: 190 mg/dL — ABNORMAL HIGH (ref 70–99)
Glucose-Capillary: 168 mg/dL — ABNORMAL HIGH (ref 70–99)

## 2021-07-21 LAB — APTT: aPTT: 29 seconds (ref 24–36)

## 2021-07-21 LAB — PROTIME-INR
INR: 0.9 (ref 0.8–1.2)
Prothrombin Time: 12.6 seconds (ref 11.4–15.2)

## 2021-07-21 SURGERY — CYSTOSCOPY WITH RETROGRADE URETHROGRAM
Anesthesia: General

## 2021-07-21 MED ORDER — FENTANYL CITRATE PF 50 MCG/ML IJ SOSY: 25.0000 ug | PREFILLED_SYRINGE | INTRAMUSCULAR | Status: DC | PRN

## 2021-07-21 MED ORDER — IOHEXOL 300 MG/ML  SOLN
INTRAMUSCULAR | Status: DC | PRN
  Administered 2021-07-21: 10 mL via URETHRAL

## 2021-07-21 MED ORDER — ORAL CARE MOUTH RINSE: 15.0000 mL | Freq: Once | OROMUCOSAL | Status: AC

## 2021-07-21 MED ORDER — MIDAZOLAM HCL 5 MG/5ML IJ SOLN
INTRAMUSCULAR | Status: DC | PRN
  Administered 2021-07-21: 2 mg via INTRAVENOUS

## 2021-07-21 MED ORDER — FENTANYL CITRATE (PF) 100 MCG/2ML IJ SOLN
INTRAMUSCULAR | Status: DC | PRN
  Administered 2021-07-21 (×2): 25 ug via INTRAVENOUS

## 2021-07-21 MED ORDER — LACTATED RINGERS IV SOLN: INTRAVENOUS | Status: DC

## 2021-07-21 MED ORDER — LIDOCAINE HCL (PF) 2 % IJ SOLN
INTRAMUSCULAR | Status: AC
  Filled 2021-07-21: qty 5

## 2021-07-21 MED ORDER — GENTAMICIN SULFATE 40 MG/ML IJ SOLN
5.0000 mg/kg | INTRAVENOUS | Status: AC
  Administered 2021-07-21: 380 mg via INTRAVENOUS
  Filled 2021-07-21: qty 9.5

## 2021-07-21 MED ORDER — PROPOFOL 10 MG/ML IV BOLUS
INTRAVENOUS | Status: AC
  Filled 2021-07-21: qty 20

## 2021-07-21 MED ORDER — EPHEDRINE SULFATE-NACL 50-0.9 MG/10ML-% IV SOSY
PREFILLED_SYRINGE | INTRAVENOUS | Status: DC | PRN
  Administered 2021-07-21: 10 mg via INTRAVENOUS

## 2021-07-21 MED ORDER — TRAMADOL HCL 50 MG PO TABS: 50.0000 mg | ORAL_TABLET | Freq: Four times a day (QID) | ORAL | 0 refills | Status: DC | PRN

## 2021-07-21 MED ORDER — AMISULPRIDE (ANTIEMETIC) 5 MG/2ML IV SOLN: 10.0000 mg | Freq: Once | INTRAVENOUS | Status: DC | PRN

## 2021-07-21 MED ORDER — DEXAMETHASONE SODIUM PHOSPHATE 10 MG/ML IJ SOLN
INTRAMUSCULAR | Status: DC | PRN
  Administered 2021-07-21: 10 mg via INTRAVENOUS

## 2021-07-21 MED ORDER — PHENYLEPHRINE 40 MCG/ML (10ML) SYRINGE FOR IV PUSH (FOR BLOOD PRESSURE SUPPORT)
PREFILLED_SYRINGE | INTRAVENOUS | Status: DC | PRN
  Administered 2021-07-21: 80 ug via INTRAVENOUS
  Administered 2021-07-21: 120 ug via INTRAVENOUS

## 2021-07-21 MED ORDER — SODIUM CHLORIDE 0.9 % IR SOLN
Status: DC | PRN
  Administered 2021-07-21: 3000 mL via INTRAVESICAL

## 2021-07-21 MED ORDER — CHLORHEXIDINE GLUCONATE 0.12 % MT SOLN
15.0000 mL | Freq: Once | OROMUCOSAL | Status: AC
  Administered 2021-07-21: 15 mL via OROMUCOSAL

## 2021-07-21 MED ORDER — PROPOFOL 10 MG/ML IV BOLUS
INTRAVENOUS | Status: DC | PRN
  Administered 2021-07-21: 150 mg via INTRAVENOUS

## 2021-07-21 MED ORDER — PHENAZOPYRIDINE HCL 200 MG PO TABS: 200.0000 mg | ORAL_TABLET | Freq: Three times a day (TID) | ORAL | 0 refills | Status: DC | PRN

## 2021-07-21 MED ORDER — ONDANSETRON HCL 4 MG/2ML IJ SOLN
INTRAMUSCULAR | Status: DC | PRN
  Administered 2021-07-21: 4 mg via INTRAVENOUS

## 2021-07-21 MED ORDER — PHENYLEPHRINE 40 MCG/ML (10ML) SYRINGE FOR IV PUSH (FOR BLOOD PRESSURE SUPPORT)
PREFILLED_SYRINGE | INTRAVENOUS | Status: AC
  Filled 2021-07-21: qty 10

## 2021-07-21 MED ORDER — DEXAMETHASONE SODIUM PHOSPHATE 10 MG/ML IJ SOLN
INTRAMUSCULAR | Status: AC
  Filled 2021-07-21: qty 1

## 2021-07-21 MED ORDER — MIDAZOLAM HCL 2 MG/2ML IJ SOLN
INTRAMUSCULAR | Status: AC
  Filled 2021-07-21: qty 2

## 2021-07-21 MED ORDER — ONDANSETRON HCL 4 MG/2ML IJ SOLN
INTRAMUSCULAR | Status: AC
  Filled 2021-07-21: qty 2

## 2021-07-21 MED ORDER — FENTANYL CITRATE (PF) 100 MCG/2ML IJ SOLN
INTRAMUSCULAR | Status: AC
  Filled 2021-07-21: qty 2

## 2021-07-21 MED ORDER — ACETAMINOPHEN 500 MG PO TABS
1000.0000 mg | ORAL_TABLET | Freq: Once | ORAL | Status: AC
  Administered 2021-07-21: 1000 mg via ORAL
  Filled 2021-07-21: qty 2

## 2021-07-21 MED ORDER — LIDOCAINE 2% (20 MG/ML) 5 ML SYRINGE
INTRAMUSCULAR | Status: DC | PRN
  Administered 2021-07-21: 60 mg via INTRAVENOUS

## 2021-07-21 SURGICAL SUPPLY — 20 items
BAG URO CATCHER STRL LF (MISCELLANEOUS) ×3 IMPLANT
BASKET ZERO TIP NITINOL 2.4FR (BASKET) IMPLANT
BSKT STON RTRVL ZERO TP 2.4FR (BASKET)
CATH FOLEY 2WAY SLVR  5CC 14FR (CATHETERS) ×2
CATH FOLEY 2WAY SLVR 5CC 14FR (CATHETERS) IMPLANT
CATH URETL OPEN 5X70 (CATHETERS) ×2 IMPLANT
CLOTH BEACON ORANGE TIMEOUT ST (SAFETY) ×3 IMPLANT
EXTRACTOR STONE 1.7FRX115CM (UROLOGICAL SUPPLIES) IMPLANT
GLOVE SURG ENC TEXT LTX SZ7.5 (GLOVE) ×5 IMPLANT
GOWN STRL REUS W/TWL XL LVL3 (GOWN DISPOSABLE) ×4 IMPLANT
GUIDEWIRE ANG ZIPWIRE 038X150 (WIRE) IMPLANT
GUIDEWIRE STR DUAL SENSOR (WIRE) ×3 IMPLANT
MANIFOLD NEPTUNE II (INSTRUMENTS) ×3 IMPLANT
PACK CYSTO (CUSTOM PROCEDURE TRAY) ×3 IMPLANT
SHEATH NAVIGATOR HD 11/13X28 (SHEATH) IMPLANT
SHEATH NAVIGATOR HD 11/13X36 (SHEATH) IMPLANT
SYR TOOMEY IRRIG 70ML (MISCELLANEOUS) ×2
SYRINGE TOOMEY IRRIG 70ML (MISCELLANEOUS) IMPLANT
TUBING CONNECTING 10 (TUBING) ×3 IMPLANT
TUBING UROLOGY SET (TUBING) ×2 IMPLANT

## 2021-07-21 NOTE — Anesthesia Postprocedure Evaluation (Signed)
Anesthesia Post Note  Patient: Kristopher [name]  Procedure(s) Performed: CYSTOSCOPY WITH URETHRAL DILATION     Patient location during evaluation: PACU Anesthesia Type: General Level of consciousness: awake and alert Pain management: pain level controlled Vital Signs Assessment: post-procedure vital signs reviewed and stable Respiratory status: spontaneous breathing, nonlabored ventilation, respiratory function stable and patient connected to nasal cannula oxygen Cardiovascular status: blood pressure returned to baseline and stable Postop Assessment: no apparent nausea or vomiting Anesthetic complications: no   No notable events documented.  Last Vitals:  Vitals:   07/21/21 1230 07/21/21 1252  BP: 130/80 131/80  Pulse: 92 94  Resp: 16 20  Temp:  36.7 C  SpO2: 97% 97%    Last Pain:  Vitals:   07/21/21 1252  TempSrc: Oral  PainSc: 0-No pain                 Tiajuana Amass

## 2021-07-21 NOTE — Op Note (Signed)
Preoperative diagnosis:  Weak stream Chronic prostatitis  Postoperative diagnosis:  Meatal stenosis/stricture  Procedure: Urethral meatal dilation Retrograde urethrogram with interpretation Cystourethroscopy  Surgeon: [name], MD  Anesthesia: General  Complications: None  Intraoperative findings:  #1: The patient had a pinpoint stenosis/stricture at the urethral meatus.  This was difficult to dilate initially, I did have to pass a wire and then subsequently I passed the plastic introducer over the wire and then removed both of those.  At this point I was subsequently able to get a 12 French urethral sound past the meatus.  I then dilated all the way up to 26 [place]. #2: The retrograde urethrogram was normal with a normal caliber anterior, bulbar, and membranous urethra. #3: The bladder was normal with orthotopic ureteral orifices.  EBL: Minimal  Specimens: None  Indication: Kristopher [name] is a 71 y.o. patient with history of PCNL for large staghorn stone.  His postoperative course was complicated by gross hematuria and prostatitis with subsequent sepsis.  Following this illness the patient has consistently struggled to urinate with pain with urination, weak stream, frequency, and urgency.  He is also had chronic UTIs.  After reviewing the management options for treatment, he elected to proceed with the above surgical procedure(s). We have discussed the potential benefits and risks of the procedure, side effects of the proposed treatment, the likelihood of the patient achieving the goals of the procedure, and any potential problems that might occur during the procedure or recuperation. Informed consent has been obtained.  Description of procedure:  The patient was taken to the operating room and general anesthesia was induced.  The patient was placed in the dorsal lithotomy position, prepped and draped in the usual sterile fashion, and preoperative antibiotics were  administered. A preoperative time-out was performed.   I was unable to advance the 14 French catheter into the urethral meatus.  As such I attempted serial dilate that with a 14 French urethral sound.  I was unable to get into the urethral meatus.  I subsequently advanced a wire under fluoroscopic guidance through the pinhole urethral meatal stricture.  I then advanced the plastic introducer over the wire and was able to dilate the whole to approximately a 12 [place] size.  I subsequently remove the wire and the dilator and serial dilated the urethra meatus from 12 [place] to 26 [place].  I then placed a 14 French Foley catheter into the fossa navicularis and instilled 3 cc of sterile water into the balloon.  Using the C arm at approximately 20 degrees oblique I performed a retrograde urethrogram using 30 cc of Omnipaque contrast.  The above findings were noted, which was a normal urethrogram.  I then remove the Foley catheter and placed a 21 French rigid cystoscope to the patient's urethra into the bladder under visual guidance.  Cystoscopy was performed with the above findings.  I then removed the scope.  I replaced the 14 French catheter into the patient's bladder.  I instilled 10 cc of sterile water into the balloon.  The patient was subsequently awoken and returned the PACU in stable condition.  [name], M.D.

## 2021-07-21 NOTE — Interval H&P Note (Signed)
History and Physical Interval Note:  07/21/2021 10:58 AM  Kristopher [name]  has presented today for surgery, with the diagnosis of WEAL STREAM PROSTATITIS.  The various methods of treatment have been discussed with the patient and family. After consideration of risks, benefits and other options for treatment, the patient has consented to  Procedure(s) with comments: CYSTOSCOPY WITH RETROGRADE URETHROGRAM POSSIBLE URETHRAL BALLOON DILATION (N/A) - 46 MINS as a surgical intervention.  The patient's history has been reviewed, patient examined, no change in status, stable for surgery.  I have reviewed the patient's chart and labs.  Questions were answered to the patient's satisfaction.     [name]

## 2021-07-21 NOTE — Discharge Instructions (Signed)
Removal of catheter Remove the foley catheter in 24hrs.  This can be done easily by cutting the side port of the catheter, which will allow the balloon to deflate.  You will see 1-2 teaspoons of clear water as the balloon deflates and then the catheter can be slid out without difficulty.        Cut here   Foley Catheter Care A soft, flexible tube (Foley catheter) may have been placed in your bladder to drain urine and fluid. Follow these instructions: Taking Care of the Catheter Keep the area where the catheter leaves your body clean.  Attach the catheter to the leg so there is no tension on the catheter.  Keep the drainage bag below the level of the bladder, but keep it OFF the floor.  Do not take long soaking baths. Your caregiver will give instructions about showering.  Wash your hands before touching ANYTHING related to the catheter or bag.  Using mild soap and warm water on a washcloth:  Clean the area closest to the catheter insertion site using a circular motion around the catheter.  Clean the catheter itself by wiping AWAY from the insertion site for several inches down the tube.  NEVER wipe upward as this could sweep bacteria up into the urethra (tube in your body that normally drains the bladder) and cause infection.  Place a small amount of sterile lubricant at the tip of the penis where the catheter is entering.  Taking Care of the Drainage Bags Two drainage bags may be taken home: a large overnight drainage bag, and a smaller leg bag which fits underneath clothing.  It is okay to wear the overnight bag at any time, but NEVER wear the smaller leg bag at night.  Keep the drainage bag well below the level of your bladder. This prevents backflow of urine into the bladder and allows the urine to drain freely.  Anchor the tubing to your leg to prevent pulling or tension on the catheter. Use tape or a leg strap provided by the hospital.  Empty the drainage bag when it is 1/2 to 3/4  full. Wash your hands before and after touching the bag.  Periodically check the tubing for kinks to make sure there is no pressure on the tubing which could restrict the flow of urine.  Changing the Drainage Bags Cleanse both ends of the clean bag with alcohol before changing.  Pinch off the rubber catheter to avoid urine spillage during the disconnection.  Disconnect the dirty bag and connect the clean one.  Empty the dirty bag carefully to avoid a urine spill.  Attach the new bag to the leg with tape or a leg strap.  Cleaning the Drainage Bags Whenever a drainage bag is disconnected, it must be cleaned quickly so it is ready for the next use.  Wash the bag in warm, soapy water.  Rinse the bag thoroughly with warm water.  Soak the bag for 30 minutes in a solution of white vinegar and water (1 cup vinegar to 1 quart warm water).  Rinse with warm water.  SEEK MEDICAL CARE IF:  You have chills or night sweats.  You are leaking around your catheter or have problems with your catheter. It is not uncommon to have sporadic leakage around your catheter as a result of bladder spasms. If the leakage stops, there is not much need for concern. If you are uncertain, call your caregiver.  You develop side effects that you think are coming  from your medicines.  SEEK IMMEDIATE MEDICAL CARE IF:  You are suddenly unable to urinate. Check to see if there are any kinks in the drainage tubing that may cause this. If you cannot find any kinks, call your caregiver immediately. This is an emergency.  You develop shortness of breath or chest pains.  Bleeding persists or clots develop in your urine.  You have a fever.  You develop pain in your back or over your lower belly (abdomen).  You develop pain or swelling in your legs.  Any problems you are having get worse rather than better.  MAKE SURE YOU:  Understand these instructions.  Will watch your condition.  Will get help right away if you are not doing well  or get worse.

## 2021-07-21 NOTE — Op Note (Deleted)
71 year old male status post left PCNL on 02/11/2021. The surgery was relatively uncomplicated. His postop course was complicated by gross hematuria. His catheter was removed on postop day 1, but the patient was in reevaluated several times as an outpatient for urinary retention secondary to clots. Ultimately, the patient was readmitted with urosepsis. At the time of his readmission and sepsis he converted into an atrial fibrillation rhythm. He was found to have a multi drug resistant UTI. Ultimately he was treated with IV antibiotics and discharged home. He was subsequently seen in our clinic as follow-up. His postop renal ultrasound was unremarkable. However, he had persistent voiding symptoms including weak stream, bladder pressure and pain and left-sided flank and lower back pain.   Interval: The patient is seen today for follow-up, endorsing left low back pain, weak and splayed stream, perineal pain and pressure. At his last visit he was treated for a fungal UTI.   Interval: Today the patient presents for follow-up. At his last visit we put him on 2 weeks of antifungal because of his urine culture. We doubled his tamsulosin and continued with the meloxicam. Since the patient was last here he has had no significant change. He continues to have a weak stream. He has pain with urination. He feels as if he has hang up in his urethra restricting his flow.   The patient also states that every time he voids he has bowel incontinence.     ALLERGIES: Codeine Derivatives Opiates    MEDICATIONS: Meloxicam 15 mg tablet 1 tablet PO Daily  Tamsulosin Hcl 0.4 mg capsule 1 capsule PO Daily  Tamsulosin Hcl 0.4 mg capsule 2 capsule PO Daily  Aspirin Ec 81 mg tablet, delayed release  Baclofen 20 MG Oral Tablet 0 Oral  Clopidogrel 75 mg tablet  Diltiazem Hcl 120 mg tablet  Gabapentin 600 mg tablet  Metformin Er Gastric 1,000 mg tablet, er gastric retention 24 hr  Niacin  Pantoprazole Sodium  Pioglitazone Hcl  15 mg tablet  Promethazine Hcl 25 mg tablet  Rosuvastatin Calcium 5 mg tablet  Vitamin C TABS Oral  Wellbutrin SR 150 MG Oral Tablet Extended Release 12 Hour Oral     GU PSH: Cysto Remove Stent FB Sim - 03/08/2021 Percut Stone Removal >2cm - 02/11/2021       PSH Notes: Tonsillectomy With Adenoidectomy, Laminectomy Cervical, Appendectomy, Cardiac Catheter His Ablation  stent placement 2015   NON-GU PSH: Appendectomy - 2013 Remove Spinal Lamina - 2013 Remove Tonsils And Adenoids - 2013     GU PMH: Chronic prostatitis - 05/17/2021 Splitting of urinary stream - 05/17/2021, - 04/14/2021 Acute Cystitis/UTI - 04/14/2021, - 03/15/2021 Dysuria - 04/14/2021 Renal calculus - 04/14/2021, - 03/08/2021, - 02/17/2021, - 12/27/2020 Gross hematuria - 03/08/2021, - 12/27/2020 Urinary Retention - 02/17/2021 Flank Pain - 12/27/2020      PMH Notes:  1898-06-19 00:00:00 - Note: Normal Routine History And Physical Adult  2012-01-29 11:01:16 - Note: Arthritis  heart attack   NON-GU PMH: Severe sepsis with septic shock - 03/08/2021 Arrhythmia, Rhythm Disorder - 51 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, 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 Personal history of other endocrine, nutritional and metabolic disease, History of hypercholesterolemia - 2014, History of diabetes mellitus, - 2014 Unspecified atrial fibrillation, Atrial Fibrillation - 2014 Arthritis Atrial Fibrillation Depression Diabetes Type 2 Hypercholesterolemia Liver Disease    FAMILY HISTORY: Death In The Family Father - Father Death In The Family Mother - Mother hepatic failure - Father ovarian cancer - Mother   SOCIAL HISTORY: Marital  Status: Married Preferred Language: English; Ethnicity: Not Hispanic Or Latino; Race: White Current Smoking Status: Patient does not smoke anymore.   Tobacco Use Assessment Completed: Used Tobacco in last 30 days? Has never drank.  Drinks 4+ caffeinated drinks per day.     Notes: Former smoker, Occupation:, Marital  History - Currently Married, Caffeine Use, Alcohol Use   REVIEW OF SYSTEMS:    GU Review Male:   Patient reports frequent urination, burning/ pain with urination, and get up at night to urinate. Patient denies hard to postpone urination, leakage of urine, stream starts and stops, trouble starting your stream, have to strain to urinate , erection problems, and penile pain.  Gastrointestinal (Upper):   Patient denies nausea, vomiting, and indigestion/ heartburn.  Gastrointestinal (Lower):   Patient denies diarrhea and constipation.  Constitutional:   Patient denies fever, night sweats, weight loss, and fatigue.  Skin:   Patient denies skin rash/ lesion and itching.  Eyes:   Patient denies blurred vision and double vision.  Ears/ Nose/ Throat:   Patient denies sore throat and sinus problems.  Hematologic/Lymphatic:   Patient denies swollen glands and easy bruising.  Cardiovascular:   Patient denies leg swelling and chest pains.  Respiratory:   Patient denies cough and shortness of breath.  Endocrine:   Patient denies excessive thirst.  Musculoskeletal:   Patient denies back pain and joint pain.  Neurological:   Patient denies headaches and dizziness.  Psychologic:   Patient denies depression and anxiety.   VITAL SIGNS: None   Complexity of Data:  Source Of History:  Patient  Records Review:   Previous Doctor Records, Previous Hospital Records, Previous Patient Records  Urine Test Review:   Urinalysis, Urine Culture   PROCEDURES:          Urinalysis w/Scope Dipstick Dipstick Cont'd Micro  Color: Yellow Bilirubin: Neg mg/dL WBC/hpf: >60/hpf  Appearance: Cloudy Ketones: Neg mg/dL RBC/hpf: 0 - 2/hpf  Specific Gravity: 1.020 Blood: Trace ery/uL Bacteria: NS (Not Seen)  pH: 5.5 Protein: Trace mg/dL Cystals: NS (Not Seen)  Glucose: Trace mg/dL Urobilinogen: 0.2 mg/dL Casts: Hyaline    Nitrites: Neg Trichomonas: Not Present    Leukocyte Esterase: 2+ leu/uL Mucous: Not Present      Epithelial  Cells: 0 - 5/hpf      Yeast: Moderate (5 - 10/hpf)      Sperm: Not Present    ASSESSMENT:      ICD-10 Details  1 GU:   Chronic prostatitis - N41.1   2   Splitting of urinary stream - R39.13      PLAN:           Orders Labs Urine Culture          Schedule Return Visit/Planned Activity: ASAP - Schedule Surgery          Document Letter(s):  Created for Patient: Clinical Summary         Notes:   The patient continues to have persistent prostate pain with every void. He also has a very weak stream. He has no times when he does not have a weak stream. I wonder if the patient does not have a urethral stricture. I spoke to him about cystoscopy. He was unwilling to do it in the office, but wanted to proceed to the operating room for further evaluation. I recommended that we do a retrograde urethrogram, cystoscopy, and possible balloon dilation. I went through this with him in detail and he was willing to proceed.  I have sent the patient's urine to be cultured and a preoperative sense.

## 2021-07-21 NOTE — Anesthesia Procedure Notes (Signed)
Procedure Name: LMA Insertion Date/Time: 07/21/2021 11:19 AM Performed by: Sharlette Dense, CRNA Patient Re-evaluated:Patient Re-evaluated prior to induction Oxygen Delivery Method: Circle system utilized Preoxygenation: Pre-oxygenation with 100% oxygen Induction Type: IV induction LMA: LMA inserted LMA Size: 4.0 Number of attempts: 1 Placement Confirmation: positive ETCO2 and breath sounds checked- equal and bilateral Tube secured with: Tape Dental Injury: Teeth and Oropharynx as per pre-operative assessment

## 2021-07-21 NOTE — H&P (Signed)
71 year old male status post left PCNL on 02/11/2021. The surgery was relatively uncomplicated. His postop course was complicated by gross hematuria. His catheter was removed on postop day 1, but the patient was in reevaluated several times as an outpatient for urinary retention secondary to clots. Ultimately, the patient was readmitted with urosepsis. At the time of his readmission and sepsis he converted into an atrial fibrillation rhythm. He was found to have a multi drug resistant UTI. Ultimately he was treated with IV antibiotics and discharged home. He was subsequently seen in our clinic as follow-up. His postop renal ultrasound was unremarkable. However, he had persistent voiding symptoms including weak stream, bladder pressure and pain and left-sided flank and lower back pain.    Interval: The patient is seen today for follow-up, endorsing left low back pain, weak and splayed stream, perineal pain and pressure. At his last visit he was treated for a fungal UTI.    Interval: Today the patient presents for follow-up. At his last visit we put him on 2 weeks of antifungal because of his urine culture. We doubled his tamsulosin and continued with the meloxicam. Since the patient was last here he has had no significant change. He continues to have a weak stream. He has pain with urination. He feels as if he has hang up in his urethra restricting his flow.    The patient also states that every time he voids he has bowel incontinence.      ALLERGIES: Codeine Derivatives Opiates     MEDICATIONS: Meloxicam 15 mg tablet 1 tablet PO Daily  Tamsulosin Hcl 0.4 mg capsule 1 capsule PO Daily  Tamsulosin Hcl 0.4 mg capsule 2 capsule PO Daily  Aspirin Ec 81 mg tablet, delayed release  Baclofen 20 MG Oral Tablet 0 Oral  Clopidogrel 75 mg tablet  Diltiazem Hcl 120 mg tablet  Gabapentin 600 mg tablet  Metformin Er Gastric 1,000 mg tablet, er gastric retention 24 hr  Niacin  Pantoprazole Sodium   Pioglitazone Hcl 15 mg tablet  Promethazine Hcl 25 mg tablet  Rosuvastatin Calcium 5 mg tablet  Vitamin C TABS Oral  Wellbutrin SR 150 MG Oral Tablet Extended Release 12 Hour Oral      GU PSH: Cysto Remove Stent FB Sim - 03/08/2021 Percut Stone Removal >2cm - 02/11/2021        PSH Notes: Tonsillectomy With Adenoidectomy, Laminectomy Cervical, Appendectomy, Cardiac Catheter His Ablation  stent placement 2015    NON-GU PSH: Appendectomy - 2013 Remove Spinal Lamina - 2013 Remove Tonsils And Adenoids - 2013       GU PMH: Chronic prostatitis - 05/17/2021 Splitting of urinary stream - 05/17/2021, - 04/14/2021 Acute Cystitis/UTI - 04/14/2021, - 03/15/2021 Dysuria - 04/14/2021 Renal calculus - 04/14/2021, - 03/08/2021, - 02/17/2021, - 12/27/2020 Gross hematuria - 03/08/2021, - 12/27/2020 Urinary Retention - 02/17/2021 Flank Pain - 12/27/2020      PMH Notes:  1898-06-19 00:00:00 - Note: Normal Routine History And Physical Adult  2012-01-29 11:01:16 - Note: Arthritis  heart attack    NON-GU PMH: Severe sepsis with septic shock - 03/08/2021 Arrhythmia, Rhythm Disorder - 54 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, 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 Personal history of other endocrine, nutritional and metabolic disease, History of hypercholesterolemia - 2014, History of diabetes mellitus, - 2014 Unspecified atrial fibrillation, Atrial Fibrillation - 2014 Arthritis Atrial Fibrillation Depression Diabetes Type 2 Hypercholesterolemia Liver Disease     FAMILY HISTORY: Death In The Family Father - Father Death In The Family Mother - Mother hepatic  failure - Father ovarian cancer - Mother    SOCIAL HISTORY: Marital Status: Married Preferred Language: English; Ethnicity: Not Hispanic Or Latino; Race: White Current Smoking Status: Patient does not smoke anymore.    Tobacco Use Assessment Completed: Used Tobacco in last 30 days? Has never drank.  Drinks 4+ caffeinated drinks per day.     Notes: Former  smoker, Occupation:, Marital History - Currently Married, Caffeine Use, Alcohol Use    REVIEW OF SYSTEMS:    GU Review Male:   Patient reports frequent urination, burning/ pain with urination, and get up at night to urinate. Patient denies hard to postpone urination, leakage of urine, stream starts and stops, trouble starting your stream, have to strain to urinate , erection problems, and penile pain.  Gastrointestinal (Upper):   Patient denies nausea, vomiting, and indigestion/ heartburn.  Gastrointestinal (Lower):   Patient denies diarrhea and constipation.  Constitutional:   Patient denies fever, night sweats, weight loss, and fatigue.  Skin:   Patient denies skin rash/ lesion and itching.  Eyes:   Patient denies blurred vision and double vision.  Ears/ Nose/ Throat:   Patient denies sore throat and sinus problems.  Hematologic/Lymphatic:   Patient denies swollen glands and easy bruising.  Cardiovascular:   Patient denies leg swelling and chest pains.  Respiratory:   Patient denies cough and shortness of breath.  Endocrine:   Patient denies excessive thirst.  Musculoskeletal:   Patient denies back pain and joint pain.  Neurological:   Patient denies headaches and dizziness.  Psychologic:   Patient denies depression and anxiety.    VITAL SIGNS: None    Complexity of Data:  Source Of History:  Patient  Records Review:   Previous Doctor Records, Previous Hospital Records, Previous Patient Records  Urine Test Review:   Urinalysis, Urine Culture    PROCEDURES:           Urinalysis w/Scope Dipstick Dipstick Cont'd Micro  Color: Yellow Bilirubin: Neg mg/dL WBC/hpf: >60/hpf  Appearance: Cloudy Ketones: Neg mg/dL RBC/hpf: 0 - 2/hpf  Specific Gravity: 1.020 Blood: Trace ery/uL Bacteria: NS (Not Seen)  pH: 5.5 Protein: Trace mg/dL Cystals: NS (Not Seen)  Glucose: Trace mg/dL Urobilinogen: 0.2 mg/dL Casts: Hyaline    Nitrites: Neg Trichomonas: Not Present    Leukocyte Esterase: 2+ leu/uL  Mucous: Not Present      Epithelial Cells: 0 - 5/hpf      Yeast: Moderate (5 - 10/hpf)      Sperm: Not Present      ASSESSMENT:      ICD-10 Details  1 GU:   Chronic prostatitis - N41.1   2   Splitting of urinary stream - R39.13       PLAN:            Orders Labs Urine Culture            Schedule Return Visit/Planned Activity: ASAP - Schedule Surgery            Document Letter(s):  Created for Patient: Clinical Summary           Notes:   The patient continues to have persistent prostate pain with every void. He also has a very weak stream. He has no times when he does not have a weak stream. I wonder if the patient does not have a urethral stricture. I spoke to him about cystoscopy. He was unwilling to do it in the office, but wanted to proceed to the operating room for further evaluation.  I recommended that we do a retrograde urethrogram, cystoscopy, and possible balloon dilation. I went through this with him in detail and he was willing to proceed.    I have sent the patient's urine to be cultured and a preoperative sense.

## 2021-07-21 NOTE — Transfer of Care (Signed)
Immediate Anesthesia Transfer of Care Note  Patient: Kristopher [name]  Procedure(s) Performed: CYSTOSCOPY WITH URETHRAL DILATION  Patient Location: PACU  Anesthesia Type:General  Level of Consciousness: awake and alert   Airway & Oxygen Therapy: Patient Spontanous Breathing and Patient connected to face mask oxygen  Post-op Assessment: Report given to RN and Post -op Vital signs reviewed and stable  Post vital signs: Reviewed and stable  Last Vitals:  Vitals Value Taken Time  BP 127/70 07/21/21 1205  Temp    Pulse 90 07/21/21 1206  Resp 11 07/21/21 1206  SpO2 100 % 07/21/21 1206  Vitals shown include unvalidated device data.  Last Pain:  Vitals:   07/21/21 1002  TempSrc:   PainSc: 4       Patients Stated Pain Goal: 3 (67/20/94 7096)  Complications: No notable events documented.

## 2021-07-21 NOTE — Anesthesia Preprocedure Evaluation (Signed)
Anesthesia Evaluation  Patient identified by MRN, date of birth, ID band Patient awake    Reviewed: Allergy & Precautions, NPO status , Patient's Chart, lab work & pertinent test results  Airway Mallampati: II  TM Distance: >3 FB Neck ROM: Full    Dental  (+) Dental Advisory Given   Pulmonary sleep apnea and Continuous Positive Airway Pressure Ventilation , former smoker,    breath sounds clear to auscultation       Cardiovascular hypertension, Pt. on medications and Pt. on home beta blockers + CAD, + Past MI and + Cardiac Stents   Rhythm:Regular Rate:Normal     Neuro/Psych negative neurological ROS     GI/Hepatic negative GI ROS, (+) Hepatitis -  Endo/Other  diabetes, Type 2  Renal/GU Renal disease     Musculoskeletal  (+) Arthritis ,   Abdominal   Peds  Hematology  (+) anemia ,   Anesthesia Other Findings   Reproductive/Obstetrics                             Lab Results  Component Value Date   WBC 5.2 07/14/2021   HGB 11.0 (L) 07/14/2021   HCT 31.9 (L) 07/14/2021   MCV 96.7 07/14/2021   PLT 172 07/14/2021   Lab Results  Component Value Date   CREATININE 1.12 07/14/2021   BUN 20 07/14/2021   NA 139 07/14/2021   K 4.3 07/14/2021   CL 105 07/14/2021   CO2 25 07/14/2021    Anesthesia Physical Anesthesia Plan  ASA: 3  Anesthesia Plan: General   Post-op Pain Management: Tylenol PO (pre-op) and Minimal or no pain anticipated   Induction: Intravenous  PONV Risk Score and Plan: 2 and Dexamethasone, Ondansetron and Treatment may vary due to age or medical condition  Airway Management Planned: LMA  Additional Equipment: None  Intra-op Plan:   Post-operative Plan: Extubation in OR  Informed Consent: I have reviewed the patients History and Physical, chart, labs and discussed the procedure including the risks, benefits and alternatives for the proposed anesthesia with the  patient or authorized representative who has indicated his/her understanding and acceptance.     Dental advisory given  Plan Discussed with:   Anesthesia Plan Comments:         Anesthesia Quick Evaluation

## 2021-07-22 ENCOUNTER — Encounter (HOSPITAL_COMMUNITY): Payer: Self-pay | Admitting: Urology

## 2021-08-02 DIAGNOSIS — M5116 Intervertebral disc disorders with radiculopathy, lumbar region: Secondary | ICD-10-CM | POA: Diagnosis not present

## 2021-08-04 DIAGNOSIS — R3913 Splitting of urinary stream: Secondary | ICD-10-CM | POA: Diagnosis not present

## 2021-08-04 DIAGNOSIS — N200 Calculus of kidney: Secondary | ICD-10-CM | POA: Diagnosis not present

## 2021-08-19 DIAGNOSIS — F331 Major depressive disorder, recurrent, moderate: Secondary | ICD-10-CM | POA: Diagnosis not present

## 2021-08-19 DIAGNOSIS — M7989 Other specified soft tissue disorders: Secondary | ICD-10-CM | POA: Diagnosis not present

## 2021-08-19 DIAGNOSIS — E1159 Type 2 diabetes mellitus with other circulatory complications: Secondary | ICD-10-CM | POA: Diagnosis not present

## 2021-08-19 DIAGNOSIS — G894 Chronic pain syndrome: Secondary | ICD-10-CM | POA: Diagnosis not present

## 2021-08-19 DIAGNOSIS — M5116 Intervertebral disc disorders with radiculopathy, lumbar region: Secondary | ICD-10-CM | POA: Diagnosis not present

## 2021-08-24 DIAGNOSIS — S61210A Laceration without foreign body of right index finger without damage to nail, initial encounter: Secondary | ICD-10-CM | POA: Diagnosis not present

## 2021-08-24 DIAGNOSIS — W260XXA Contact with knife, initial encounter: Secondary | ICD-10-CM | POA: Diagnosis not present

## 2021-08-25 ENCOUNTER — Other Ambulatory Visit (HOSPITAL_COMMUNITY): Payer: Self-pay | Admitting: Internal Medicine

## 2021-08-25 DIAGNOSIS — M7989 Other specified soft tissue disorders: Secondary | ICD-10-CM

## 2021-08-26 ENCOUNTER — Other Ambulatory Visit: Payer: Self-pay

## 2021-08-26 ENCOUNTER — Ambulatory Visit (HOSPITAL_COMMUNITY)
Admission: RE | Admit: 2021-08-26 | Discharge: 2021-08-26 | Disposition: A | Discharge status: Home / Self Care | Payer: Medicare HMO | Source: Ambulatory Visit | Attending: Internal Medicine | Admitting: Internal Medicine

## 2021-08-26 DIAGNOSIS — M7989 Other specified soft tissue disorders: Secondary | ICD-10-CM | POA: Diagnosis not present

## 2021-08-26 NOTE — Progress Notes (Signed)
Upper extremity venous has been completed.  ? ?Preliminary results in CV Proc.  ? ?Kristopher [name] ?08/26/2021 1:12 PM    ?

## 2021-08-29 DIAGNOSIS — M5116 Intervertebral disc disorders with radiculopathy, lumbar region: Secondary | ICD-10-CM | POA: Diagnosis not present

## 2021-09-06 DIAGNOSIS — Z959 Presence of cardiac and vascular implant and graft, unspecified: Secondary | ICD-10-CM | POA: Diagnosis not present

## 2021-09-14 DIAGNOSIS — Z959 Presence of cardiac and vascular implant and graft, unspecified: Secondary | ICD-10-CM | POA: Diagnosis not present

## 2021-09-14 DIAGNOSIS — M5116 Intervertebral disc disorders with radiculopathy, lumbar region: Secondary | ICD-10-CM | POA: Diagnosis not present

## 2021-10-05 DIAGNOSIS — Z6826 Body mass index (BMI) 26.0-26.9, adult: Secondary | ICD-10-CM | POA: Diagnosis not present

## 2021-10-05 DIAGNOSIS — M461 Sacroiliitis, not elsewhere classified: Secondary | ICD-10-CM | POA: Diagnosis not present

## 2021-10-18 DIAGNOSIS — M533 Sacrococcygeal disorders, not elsewhere classified: Secondary | ICD-10-CM | POA: Diagnosis not present

## 2021-11-01 DIAGNOSIS — M4316 Spondylolisthesis, lumbar region: Secondary | ICD-10-CM | POA: Diagnosis not present

## 2021-11-01 DIAGNOSIS — Z6825 Body mass index (BMI) 25.0-25.9, adult: Secondary | ICD-10-CM | POA: Diagnosis not present

## 2021-11-01 DIAGNOSIS — M5416 Radiculopathy, lumbar region: Secondary | ICD-10-CM | POA: Diagnosis not present

## 2021-11-17 ENCOUNTER — Other Ambulatory Visit: Payer: Self-pay | Admitting: Neurological Surgery

## 2021-11-21 DIAGNOSIS — E1140 Type 2 diabetes mellitus with diabetic neuropathy, unspecified: Secondary | ICD-10-CM | POA: Diagnosis not present

## 2021-11-21 DIAGNOSIS — F331 Major depressive disorder, recurrent, moderate: Secondary | ICD-10-CM | POA: Diagnosis not present

## 2021-11-21 DIAGNOSIS — G894 Chronic pain syndrome: Secondary | ICD-10-CM | POA: Diagnosis not present

## 2021-11-21 DIAGNOSIS — M545 Low back pain, unspecified: Secondary | ICD-10-CM | POA: Diagnosis not present

## 2021-11-29 NOTE — Pre-Procedure Instructions (Signed)
Surgical Instructions    Your procedure is scheduled on Thursday 12/08/21.   Report to [facility] Main Entrance "A" at 07:30 A.M., then check in with the Admitting office.  Call this number if you have problems the morning of surgery:  [phone]   If you have any questions prior to your surgery date call [phone]: Open Monday-Friday 8am-4pm    Remember:  Do not eat after midnight the night before your surgery  You may drink clear liquids until 06:30 A.M. the morning of your surgery.   Clear liquids allowed are: Water, Non-Citrus Juices (without pulp), Carbonated Beverages, Clear Tea, Black Coffee ONLY (NO MILK, CREAM OR POWDERED CREAMER of any kind), and Gatorade    Take these medicines the morning of surgery with A SIP OF WATER:   diltiazem (CARDIZEM SR)  escitalopram (LEXAPRO)  gabapentin (NEURONTIN)  pantoprazole (PROTONIX)   Take these medicines if needed:   baclofen (LIORESAL)  cetirizine (ZYRTEC)  promethazine (PHENERGAN)  traMADol (ULTRAM)     Please follow your surgeon's instructions regarding Plavix. If you have not received instructions then please contact your surgeon's office for instructions.   As of today, STOP taking any Aspirin (unless otherwise instructed by your surgeon) Aleve, Naproxen, Ibuprofen, Motrin, Advil, Goody's, BC's, all herbal medications, fish oil, and all vitamins.  WHAT DO I DO ABOUT MY DIABETES MEDICATION?   Do not take oral diabetes medicines (pills) the morning of surgery.  DO NOT TAKE metFORMIN (GLUCOPHAGE-XR) the morning of surgery.   DO NOT TAKE pioglitazone (ACTOS) the morning of surgery.   THE NIGHT BEFORE SURGERY, take ___________ units of ___________insulin.       THE MORNING OF SURGERY, take _____________ units of __________insulin.  The day of surgery, do not take other diabetes injectables, including Byetta (exenatide), Bydureon (exenatide ER), Victoza (liraglutide), or Trulicity (dulaglutide).  If your CBG is  greater than 220 mg/dL, you may take  of your sliding scale (correction) dose of insulin.   HOW TO MANAGE YOUR DIABETES BEFORE AND AFTER SURGERY  Why is it important to control my blood sugar before and after surgery? Improving blood sugar levels before and after surgery helps healing and can limit problems. A way of improving blood sugar control is eating a healthy diet by:  Eating less sugar and carbohydrates  Increasing activity/exercise  Talking with your doctor about reaching your blood sugar goals High blood sugars (greater than 180 mg/dL) can raise your risk of infections and slow your recovery, so you will need to focus on controlling your diabetes during the weeks before surgery. Make sure that the doctor who takes care of your diabetes knows about your planned surgery including the date and location.  How do I manage my blood sugar before surgery? Check your blood sugar at least 4 times a day, starting 2 days before surgery, to make sure that the level is not too high or low.  Check your blood sugar the morning of your surgery when you wake up and every 2 hours until you get to the Short Stay unit.  If your blood sugar is less than 70 mg/dL, you will need to treat for low blood sugar: Do not take insulin. Treat a low blood sugar (less than 70 mg/dL) with  cup of clear juice (cranberry or apple), 4 glucose tablets, OR glucose gel. Recheck blood sugar in 15 minutes after treatment (to make sure it is greater than 70 mg/dL). If your blood sugar is not greater than 70 mg/dL on  recheck, call [phone] for further instructions. Report your blood sugar to the short stay nurse when you get to Short Stay.  If you are admitted to the hospital after surgery: Your blood sugar will be checked by the staff and you will probably be given insulin after surgery (instead of oral diabetes medicines) to make sure you have good blood sugar levels. The goal for blood sugar control after surgery  is 80-180 mg/dL.           Do not wear jewelry or makeup Do not wear lotions, powders, perfumes/colognes, or deodorant. Do not shave 48 hours prior to surgery.  Men may shave face and neck. Do not bring valuables to the hospital. Do not wear nail polish, gel polish, artificial nails, or any other type of covering on natural nails (fingers and toes) If you have artificial nails or gel coating that need to be removed by a nail salon, please have this removed prior to surgery. Artificial nails or gel coating may interfere with anesthesia's ability to adequately monitor your vital signs.  [facility] is not responsible for any belongings or valuables. .   Do NOT Smoke (Tobacco/Vaping)  24 hours prior to your procedure  If you use a CPAP at night, you may bring your mask for your overnight stay.   Contacts, glasses, hearing aids, dentures or partials may not be worn into surgery, please bring cases for these belongings   For patients admitted to the hospital, discharge time will be determined by your treatment team.   Patients discharged the day of surgery will not be allowed to drive home, and someone needs to stay with them for 24 hours.   SURGICAL WAITING ROOM VISITATION Patients having surgery or a procedure in a hospital may have two support people. Children under the age of 35 must have an adult with them who is not the patient. They may stay in the waiting area during the procedure and may switch out with other visitors. If the patient needs to stay at the hospital during part of their recovery, the visitor guidelines for inpatient rooms apply.  Please refer to the [facility] website for the visitor guidelines for Inpatients (after your surgery is over and you are in a regular room).       Special instructions:    Oral Hygiene is also important to reduce your risk of infection.  Remember - BRUSH YOUR TEETH THE MORNING OF SURGERY WITH YOUR REGULAR TOOTHPASTE   [place]-  Preparing For Surgery  Before surgery, you can play an important role. Because skin is not sterile, your skin needs to be as free of germs as possible. You can reduce the number of germs on your skin by washing with CHG (chlorahexidine gluconate) Soap before surgery.  CHG is an antiseptic cleaner which kills germs and bonds with the skin to continue killing germs even after washing.     Please do not use if you have an allergy to CHG or antibacterial soaps. If your skin becomes reddened/irritated stop using the CHG.  Do not shave (including legs and underarms) for at least 48 hours prior to first CHG shower. It is OK to shave your face.  Please follow these instructions carefully.     Shower the NIGHT BEFORE SURGERY and the MORNING OF SURGERY with CHG Soap.   If you chose to wash your hair, wash your hair first as usual with your normal shampoo. After you shampoo, rinse your hair and body thoroughly to  remove the shampoo.  Then [employer] and genitals (private parts) with your normal soap and rinse thoroughly to remove soap.  After that Use CHG Soap as you would any other liquid soap. You can apply CHG directly to the skin and wash gently with a scrungie or a clean washcloth.   Apply the CHG Soap to your body ONLY FROM THE NECK DOWN.  Do not use on open wounds or open sores. Avoid contact with your eyes, ears, mouth and genitals (private parts). Wash Face and genitals (private parts)  with your normal soap.   Wash thoroughly, paying special attention to the area where your surgery will be performed.  Thoroughly rinse your body with warm water from the neck down.  DO NOT shower/wash with your normal soap after using and rinsing off the CHG Soap.  Pat yourself dry with a CLEAN TOWEL.  Wear CLEAN PAJAMAS to bed the night before surgery  Place CLEAN SHEETS on your bed the night before your surgery  DO NOT SLEEP WITH PETS.   Day of Surgery:  Take a shower with CHG soap. Wear  Clean/Comfortable clothing the morning of surgery Do not apply any deodorants/lotions.   Remember to brush your teeth WITH YOUR REGULAR TOOTHPASTE.    If you received a COVID test during your pre-op visit, it is requested that you wear a mask when out in public, stay away from anyone that may not be feeling well, and notify your surgeon if you develop symptoms. If you have been in contact with anyone that has tested positive in the last 10 days, please notify your surgeon.    Please read over the following fact sheets that you were given.

## 2021-11-30 ENCOUNTER — Encounter (HOSPITAL_COMMUNITY): Payer: Self-pay

## 2021-11-30 ENCOUNTER — Other Ambulatory Visit: Payer: Self-pay

## 2021-11-30 ENCOUNTER — Encounter (HOSPITAL_COMMUNITY)
Admission: RE | Admit: 2021-11-30 | Discharge: 2021-11-30 | Disposition: A | Discharge status: Home / Self Care | Payer: Medicare HMO | Source: Ambulatory Visit | Attending: Neurological Surgery | Admitting: Neurological Surgery

## 2021-11-30 VITALS — BP 118/67 | HR 71 | Temp 97.9°F | Resp 17 | Ht 67.0 in | Wt 161.1 lb

## 2021-11-30 DIAGNOSIS — Z01818 Encounter for other preprocedural examination: Secondary | ICD-10-CM

## 2021-11-30 DIAGNOSIS — Z01812 Encounter for preprocedural laboratory examination: Secondary | ICD-10-CM | POA: Diagnosis not present

## 2021-11-30 DIAGNOSIS — E119 Type 2 diabetes mellitus without complications: Secondary | ICD-10-CM | POA: Insufficient documentation

## 2021-11-30 HISTORY — DX: Myoneural disorder, unspecified: G70.9

## 2021-11-30 HISTORY — DX: Syncope and collapse: R55

## 2021-11-30 LAB — COMPREHENSIVE METABOLIC PANEL
ALT: 14 U/L (ref 0–44)
AST: 18 U/L (ref 15–41)
Albumin: 3.9 g/dL (ref 3.5–5.0)
Alkaline Phosphatase: 55 U/L (ref 38–126)
Anion gap: 10 (ref 5–15)
BUN: 23 mg/dL (ref 8–23)
CO2: 25 mmol/L (ref 22–32)
Calcium: 8.8 mg/dL — ABNORMAL LOW (ref 8.9–10.3)
Chloride: 99 mmol/L (ref 98–111)
Creatinine, Ser: 0.99 mg/dL (ref 0.61–1.24)
GFR, Estimated: >60 mL/min (ref >60)
Glucose, Bld: 111 mg/dL — ABNORMAL HIGH (ref 70–99)
Potassium: 3.9 mmol/L (ref 3.5–5.1)
Sodium: 134 mmol/L — ABNORMAL LOW (ref 135–145)
Total Bilirubin: 0.9 mg/dL (ref 0.3–1.2)
Total Protein: 6.5 g/dL (ref 6.5–8.1)

## 2021-11-30 LAB — CBC
HCT: 32.5 % — ABNORMAL LOW (ref 39.0–52.0)
Hemoglobin: 11.2 g/dL — ABNORMAL LOW (ref 13.0–17.0)
MCH: 31.8 pg (ref 26.0–34.0)
MCHC: 34.5 g/dL (ref 30.0–36.0)
MCV: 92.3 fL (ref 80.0–100.0)
Platelets: 225 10*3/uL (ref 150–400)
RBC: 3.52 MIL/uL — ABNORMAL LOW (ref 4.22–5.81)
RDW: 14.9 % (ref 11.5–15.5)
WBC: 7.1 10*3/uL (ref 4.0–10.5)
nRBC: 0 % (ref 0.0–0.2)

## 2021-11-30 LAB — GLUCOSE, CAPILLARY: Glucose-Capillary: 115 mg/dL — ABNORMAL HIGH (ref 70–99)

## 2021-11-30 LAB — TYPE AND SCREEN
ABO/RH(D): A POS
Antibody Screen: NEGATIVE

## 2021-11-30 LAB — SURGICAL PCR SCREEN
MRSA, PCR: NEGATIVE
Staphylococcus aureus: NEGATIVE

## 2021-11-30 NOTE — Progress Notes (Signed)
PCP - Dr. Vassie Loll Eyk Cardiologist - Dr. Thad Ranger   PPM/ICD - denies Device Orders - n/a Rep Notified - n/a  Chest x-ray - 03/01/2021 EKG - 02/28/2021 Stress Test - Per patient, over 4 years ago ECHO - 02/28/2021 Cardiac Cath - 10/07/2013 with one DES  Sleep Study - Positive for OSA a few years ago. Pt lost weight and no longer wears CPAP  CBG 115 at PAT visit. Normal range 140-150.  Checks Blood Sugar __1__ times a day  Blood Thinner Instructions: Pt will stop Plavix 5 days prior to surgery Aspirin Instructions: Per patient, cardiology prefers that patient stay on ASA until surgery. Instructions given to called Dr. Ruthine Dose office to verify if/when aspirin needs to be stopped.  ERAS Protcol - Yes. Clear liquids until 0630 morning of surgery. PRE-SURGERY Ensure or G2- n/a. None ordered  COVID TEST- n/a   Anesthesia review: Yes. 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 Myra Gianotti PA-C and Karoline Caldwell PA-C made aware during PAT visit.  Patient denies shortness of breath, fever, cough and chest pain at PAT appointment   All instructions explained to the patient, with a verbal understanding of the material. Patient agrees to go over the instructions while at home for a better understanding. Patient also instructed to self quarantine after being tested for COVID-19. The opportunity to ask questions was provided.

## 2021-12-01 ENCOUNTER — Other Ambulatory Visit (HOSPITAL_COMMUNITY): Payer: Medicare HMO

## 2021-12-01 NOTE — Progress Notes (Signed)
Anesthesia Chart Review:  Case: [id number] Date/Time: 12/08/21 0915   Procedure: Left L5-S1 MIS TLIF/Metrx (Left)   Anesthesia type: General   Pre-op diagnosis: Spondylolisthesis, Lumbar region   Location: [facility] OR ROOM 21 / [facility]   Surgeons: Judith Part, MD       DISCUSSION: Patient is a 71 year old male scheduled for the above procedure.  History includes former smoker (quit 05/06/07), afib (s/p PVI x2 at [facility], 11/24/99 2000), CAD (angina/NSTEMI 06/27/13, s/p DES RCA), ischemic cardiomyopathy, pericarditis (2004 with chronic pericardial effusion), HLD, DM2, non-alcoholic cirrhosis, chronic back pain, anemia, neuropathy (BLE), OSA (not using CPAP), vasovagal syncope (April 2021, recurrent, s/p [employer] loop recorder 02/13/20), spinal surgery ("C2-T1 fusion" 2009), nephrolithiasis (02/11/21), chronic prostatitis (with meatal stenosis, s/p dilation 07/21/21).    Last primary care evaluation by Dr. Nona Dell was 11/21/2021.  He notes plans for upcoming spinal surgery.  Last cardiology visit with Dr. Maudie Mercury was on 01/11/21.  CAD stable, walking 40 to 45 minutes several times a week without chest pain.  No recurrent syncope.  He was having issues tolerating CPAP due to a partial plate.  6 month APP with 12 month MD follow-up recommended.  Dr. Maudie Mercury gave him permission to hold Plavix for 5 days prior to his urological procedure earlier this year.  Patient reports he is to hold Plavix for 5 days, and that cardiology had preferred he continue aspirin if possible.  Patient advised to clarify with Dr. Venetia Constable.  As of 09/06/21 Loop Recorder interrogation ([facility]): "Physician Comments: Stable BSC ILR battery and device function.  ILR indication: syncope.  1 brady event c/w SB w/ min HR 30 bpm during nocturnal hrs.  1 Tachy episode with EGMs appearing to be due to AT/AF lasting 15 sec @ 166 bpm."  ADDENDUM 12/05/21 9:20 AM: Received cardiac preoperative risk assessment. He was classified as "Moderate Risk" with  permission to hold Plavix for 5 days prior to surgery.   VS: BP 118/67   Pulse 71   Temp 36.6 C (Oral)   Resp 17   Ht _0  (1.702 m)   Wt 73.1 kg   SpO2 97%   BMI 25.23 kg/m    PROVIDERS: Townsend Roger, MD is PCP  Thad Ranger, MD is cardiologist Terald Sleeper) Dinah Beers, MD is EP cardiologist Terald Sleeper) Nicola Girt, MD is endocrinologist Terald Sleeper)   LABS: Labs reviewed: Acceptable for surgery. PT/INR and PTT were normal 07/21/21. A1c 6.4% 11/21/21 [facility]) (all labs ordered are listed, but only abnormal results are displayed)  Labs Reviewed  GLUCOSE, CAPILLARY - Abnormal; Notable for the following components:      Result Value   Glucose-Capillary 115 (*)    All other components within normal limits  COMPREHENSIVE METABOLIC PANEL - Abnormal; Notable for the following components:   Sodium 134 (*)    Glucose, Bld 111 (*)    Calcium 8.8 (*)    All other components within normal limits  CBC - Abnormal; Notable for the following components:   RBC 3.52 (*)    Hemoglobin 11.2 (*)    HCT 32.5 (*)    All other components within normal limits  SURGICAL PCR SCREEN  TYPE AND SCREEN    Sleep Study 11/18/13: IMPRESSION/ RECOMMENDATION:   1) minimal obstructive sleep apnea/hypopnea syndrome, with an AHI of 6 events per hour and transient oxygen desaturation as low as 82%. Treatment for this degree of sleep apnea can include a trial of weight loss alone, upper airway  surgery, dental appliance, and also CPAP. Clinical correlation is suggested. 2) rare PVC noted, but no clinically significant arrhythmias were seen 3) large numbers of limb movements noted during the night with 3 per hour resulting in arousal or awakening. Clinical correlation is suggested to consider a primary movement disorder of sleep.   IMAGES: MRI L-spine 07/16/21: IMPRESSION: - Multilevel degenerative changes of the lumbar spine with transitional lumbosacral vertebrae. Independent review of  these levels should be performed prior to any planned intervention. - Moderate to severe spinal canal stenosis and mild to moderate left-sided neural foraminal narrowing at L5-S1 due to grade 1 anterolisthesis with broad-based disc bulging and severe bilateral facet degeneration. - Mild spinal canal narrowing at L3-L4 and L4-L5.   EKG: EKG 02/28/21:  Normal sinus rhythm Possible Left atrial enlargement Nonspecific T wave abnormality Prolonged QT (QT 406 ms, QTcB 477 ms) Abnormal ECG Since last tracing rate slower Confirmed by Daneen Schick [phone]) on 03/01/2021 1:02:37 PM   CV: Echo 02/28/21: IMPRESSIONS   1. Left ventricular ejection fraction, by estimation, is 50 to 55%. The  left ventricle has low normal function. The left ventricle demonstrates  regional wall motion abnormalities (see scoring diagram/findings for  description). There is mild left  ventricular hypertrophy. Left ventricular diastolic parameters are  consistent with Grade II diastolic dysfunction (pseudonormalization).  Elevated left ventricular end-diastolic pressure. There is moderate  hypokinesis of the left ventricular, basal  inferior wall and inferoseptal wall.   2. Right ventricular systolic function is low normal. The right  ventricular size is normal.   3. Effusion measures up to 1.17 cm posterior to the LV. a small  pericardial effusion is present. The pericardial effusion is posterior to  the left ventricle. There is no evidence of cardiac tamponade.   4. The mitral valve is abnormal. Mild mitral valve regurgitation.   5. The aortic valve is tricuspid. Aortic valve regurgitation is not  visualized. No aortic stenosis is present.   6. The inferior vena cava is dilated in size with <50% respiratory  variability, suggesting right atrial pressure of 15 mmHg.  - Comparison(s): Changes from prior study are noted. 09/19/2019: LVEF 55-60%,  moderate pericardial effusion, no tamponade.    14 day Holter  Monitor 12/24/19 ([facility]): IMPRESSION: *The predominant rhythm was sinus rhythm to sinus tachycardia. *The Maximum Heart Rate recorded was 142 bpm, Day 14 / 10:41:11 am, the Minimum Heart Rate recorded was 68 bpm, Day 12 / 10:19:34 am and the Average Heart Rate was 89 bpm. *There were 24213  PVCs with a burden of 1.44 %. *There were 1499 PSVCs with a burden of 0.09 %. There were 6 occurrences of Supraventricular Tachycardia with the longest episode 25 beats, Day 3 / 01:54:10 am and the fastest episode 138 bpm, Day 4 / 06:14:12 pm. *Diary  events were entered. There were 18 Patient triggered events corresponding to sinus mechanism rhythms with and without PVCs.   Per 01/11/21 office note by Dr. Maudie Mercury ([facility]): "His Tilt table test on 12/29/19 did not demonstrate any significant hemodynamic changes or bradycardia."   Adenosine stress cardiac MRI 06/06/18 ([facility]): SUMMARY: 1. The left ventricle is normal in cavity size and wall thickness. The basal-mid inferior wall and  basal-mid inferoseptum are mildly hypokinetic. Global systolic function is preserved with an LVEF  calculated at 55%.  2. The right ventricle is normal in cavity size, wall thickness, and systolic function. The RVEF is 55%.  3. The LA is mildly enlarged.  There is lipomatous hypertrophy of the interatrial septum.  4.  There is a moderate sized circumferential pericardial effusion.  The extent of the effusion is greatest  adjacent to the basal LV lateral wall 1.2 cm (prior 1.5 cm) and adjacent to the inferior portion of the RV  free wall 1.2 cm (prior 1.5 cm).  No pericardium thickening is noted.  No obvious pericardial  hyperenhancement is noted.  No RA/RV compression is noted.  There is increased adiposity within the  pericardial space.  5. The aortic valve is trileaflet with normal leaflet excursion.  There is trivial aortic regurgitation.  There is no significant aortic stenosis.  6.  There is trivial mitral regurgitation and  trivial tricuspid regurgitation.  7. Delayed enhancement imaging is abnormal. There is subendocardial hyperenhancement involving inferior  wall and basal-mid inferoseptum, consistent with subendocardial infarction in the distribution of the RCA.  The RCA territory as 50-60% residual viability present.  All other perfusion territories are fully viable.  8. Adenosine stress perfusion imaging demonstrates no perfusion defects suggestive of inducible myocardial  ischemia.    9.  There is no evidence of an intracardiac thrombus.  - Compared with prior (05/08/14), there has been no significant change.     RHC/LHC 10/07/13: IMPRESSION: - Normal right heart pressures without evidence for constrictive or restrictive physiology - Mild nonobstructive coronary artery disease with 10-20% narrowing in the proximal LAD proximal to a segment which seemed to dip intramyocardially, without evidence for muscle bridging; 30-40% narrowing in the circumflex marginal 1 vessel; 23% narrowing in the proximal RCA with a widely patent RCA stent beyond the acute margin. - Preserved LV contractility with an ejection fraction of 55% with mild residual subtle mid inferior hypocontractility.    US Carotid 04/12/10: IMPRESSION:  1.  There is slight calcific plaque formation at the carotid bifurcations  bilaterally.  2.  No hemodynamically significant stenosis is seen on either side.  3.  There is antegrade flow in both vertebrals.    Past Medical History:  Diagnosis Date   Anemia 12/2020   Anginal pain ([place])    Atrial fibrillation ([place])    a. s/p PVI X2 by Dr Omelia Blackwater at [facility] around 2000.   CAD (coronary artery disease)    a. NSTEMI (06/2013):  LHC (06/27/2013):  pLAD 10-20, RCA 90, EF 45-50%, inf HK.  PCI:  Xience Alpine (3 x 23 mm) DES to the RCA. b. Relook cath 07/10/13 - patent stent. Imdur added. c. Recurrent CP after that - abnormal nuc 07/2017 with chest pain, dyspnea, hypotensive response. Rest images with mid-basal  inferior thinning. No stress images due to abrupt d/c of test. Relook cath 3/12 - no significant r   Chronic back pain greater than 3 months duration    Cirrhosis, non-alcoholic (HCC)    Depression    DJD (degenerative joint disease) of cervical spine    "3 herniated discs" (09/01/2013)   DJD (degenerative joint disease), lumbosacral    "bulging L5-S1" (09/01/2013)   Dysrhythmia    PVCs. A.fib with two ablations   Herniated cervical disc    c2-T1 fusion   History of kidney stones    turned into urosepsis   HLD (hyperlipidemia)    Ischemic cardiomyopathy    a. Echo (06/28/2011): Inferior and inferoseptal akinesis, EF 20%, grade 2 diastolic dysfunction, MAC, small effusion (no tamponade). B. EF 40% by echo 06/2013, cath 07/2013, 55% by cath 08/2013 with subtle mild mid inferior hypocontractility.   Liver cirrhosis secondary to  NASH ([place])    Neuromuscular disorder ([place])    bilateral neuropathy legs   NSTEMI (non-ST elevated myocardial infarction) ([place]) 06/27/2013   Osteoarthritis    "neck and lower back" (09/01/2013)   Pericardial effusion    a. Chronic since hx of pericarditis in 2004. b. Mod by echo 06/2013, small by echo 06/2013.   Pneumonia ~ 2005   Type II diabetes mellitus ([place])    Vaso vagal episode    wears continuous loop recorder    Past Surgical History:  Procedure Laterality Date   APPENDECTOMY  ~ [place]  06/19/1998   Dr Omelia Blackwater at [place]     2009   CORONARY ANGIOPLASTY     CYSTOSCOPY WITH RETROGRADE URETHROGRAM N/A 07/21/2021   Procedure: Pump Back;  Surgeon: [name], MD;  Location: [facility] ORS;  Service: Urology;  Laterality: N/A;  45 MINS   LEFT AND RIGHT HEART CATHETERIZATION WITH CORONARY ANGIOGRAM N/A 10/07/2013   Procedure: LEFT AND RIGHT HEART CATHETERIZATION WITH CORONARY ANGIOGRAM;  Surgeon: Troy Sine, MD;  Location: [facility] CATH LAB;  Service:  Cardiovascular;  Laterality: N/A;   LEFT HEART CATHETERIZATION WITH CORONARY ANGIOGRAM N/A 06/27/2013   Procedure: LEFT HEART CATHETERIZATION WITH CORONARY ANGIOGRAM;  Surgeon: Troy Sine, MD;  Location: [facility] CATH LAB;  Service: Cardiovascular;  Laterality: N/A;   LEFT HEART CATHETERIZATION WITH CORONARY ANGIOGRAM N/A 07/10/2013   Procedure: LEFT HEART CATHETERIZATION WITH CORONARY ANGIOGRAM;  Surgeon: Burnell Blanks, MD;  Location: [facility] CATH LAB;  Service: Cardiovascular;  Laterality: N/A;   LEFT HEART CATHETERIZATION WITH CORONARY ANGIOGRAM N/A 08/28/2013   Procedure: LEFT HEART CATHETERIZATION WITH CORONARY ANGIOGRAM;  Surgeon: Troy Sine, MD;  Location: [facility] CATH LAB;  Service: Cardiovascular;  Laterality: N/A;   NEPHROLITHOTOMY Left 02/11/2021   Procedure: LEFT NEPHROLITHOTOMY PERCUTANEOUS WITH SURGEON ACCESS;  Surgeon: [name], MD;  Location: [facility] ORS;  Service: Urology;  Laterality: Left;   PERCUTANEOUS CORONARY STENT INTERVENTION (PCI-S)  06/27/2013   Procedure: PERCUTANEOUS CORONARY STENT INTERVENTION (PCI-S);  Surgeon: Troy Sine, MD;  Location: [facility] CATH LAB;  Service: Cardiovascular;;   POSTERIOR CERVICAL FUSION/FORAMINOTOMY  06/20/2007   fusion c3-t1   PULMONARY VEIN ABLATION  06/20/1999   Dr Omelia Blackwater at [place]  06/19/1955    MEDICATIONS:  aspirin EC 81 MG tablet   baclofen (LIORESAL) 10 MG tablet   buPROPion (WELLBUTRIN XL) 150 MG 24 hr tablet   cetirizine (ZYRTEC) 10 MG tablet   clopidogrel (PLAVIX) 75 MG tablet   diltiazem (CARDIZEM SR) 120 MG 12 hr capsule   escitalopram (LEXAPRO) 20 MG tablet   gabapentin (NEURONTIN) 600 MG tablet   metFORMIN (GLUCOPHAGE-XR) 500 MG 24 hr tablet   Misc Natural Products (PUMPKIN SEED OIL) CAPS   Multiple Vitamins-Iron (MULTIVITAMINS WITH IRON) TABS tablet   niacin 500 MG tablet   pantoprazole (PROTONIX) 40 MG tablet   phenazopyridine (PYRIDIUM) 200 MG tablet   pioglitazone (ACTOS) 15 MG tablet    promethazine (PHENERGAN) 25 MG tablet   rOPINIRole (REQUIP) 1 MG tablet   rosuvastatin (CRESTOR) 5 MG tablet   Saw Palmetto 450 MG CAPS   tamsulosin (FLOMAX) 0.4 MG CAPS capsule   traMADol (ULTRAM) 50 MG tablet   No current facility-administered medications for this encounter.    Myra Gianotti, PA-C Surgical Short Stay/Anesthesiology [facility] Phone [phone] [facility] Phone [phone])  [phone] 12/01/2021 7:20 PM

## 2021-12-02 DIAGNOSIS — Z7901 Long term (current) use of anticoagulants: Secondary | ICD-10-CM | POA: Diagnosis not present

## 2021-12-02 DIAGNOSIS — I4891 Unspecified atrial fibrillation: Secondary | ICD-10-CM | POA: Diagnosis not present

## 2021-12-02 DIAGNOSIS — I4892 Unspecified atrial flutter: Secondary | ICD-10-CM | POA: Diagnosis not present

## 2021-12-02 DIAGNOSIS — R55 Syncope and collapse: Secondary | ICD-10-CM | POA: Diagnosis not present

## 2021-12-02 DIAGNOSIS — Z959 Presence of cardiac and vascular implant and graft, unspecified: Secondary | ICD-10-CM | POA: Diagnosis not present

## 2021-12-05 NOTE — Anesthesia Preprocedure Evaluation (Addendum)
Anesthesia Evaluation  Patient identified by MRN, date of birth, ID band Patient awake    Reviewed: Allergy & Precautions, NPO status , Patient's Chart, lab work & pertinent test results  History of Anesthesia Complications Negative for: history of anesthetic complications  Airway Mallampati: II  TM Distance: >3 FB Neck ROM: Limited    Dental  (+) Partial Upper,    Pulmonary sleep apnea , former smoker,    Pulmonary exam normal        Cardiovascular hypertension, Pt. on medications + CAD and + Past MI  Normal cardiovascular exam+ dysrhythmias Atrial Fibrillation   TTE 2022: EF 50-55%, mild LVH, grade II DD, moderate  hypokinesis of the left ventricular, basal inferior wall and inferoseptal wall, small pericardial effusion, mild MR    Neuro/Psych Depression negative neurological ROS     GI/Hepatic GERD  Medicated and Controlled,(+) Cirrhosis       ,   Endo/Other  diabetes, Type 2, Oral Hypoglycemic Agents  Renal/GU negative Renal ROS  negative genitourinary   Musculoskeletal  (+) Arthritis ,   Abdominal   Peds  Hematology  (+) Blood dyscrasia, anemia , Hgb 11.2   Anesthesia Other Findings Day of surgery medications reviewed with patient.  Reproductive/Obstetrics negative OB ROS                           Anesthesia Physical Anesthesia Plan  ASA: 2  Anesthesia Plan: General   Post-op Pain Management: Tylenol PO (pre-op)* and Precedex   Induction: Intravenous  PONV Risk Score and Plan: 2 and Treatment may vary due to age or medical condition, Ondansetron, Dexamethasone, Midazolam and Propofol infusion  Airway Management Planned: Oral ETT and Video Laryngoscope Planned  Additional Equipment: None  Intra-op Plan:   Post-operative Plan: Extubation in OR  Informed Consent: I have reviewed the patients History and Physical, chart, labs and discussed the procedure including the  risks, benefits and alternatives for the proposed anesthesia with the patient or authorized representative who has indicated his/her understanding and acceptance.     Dental advisory given  Plan Discussed with: CRNA  Anesthesia Plan Comments: (Discussed listed allergies to all opiates with patient. He states he gets nausea with all opioids but otherwise has tolerated them as needed in the past. We discussed that complete avoidance of opioids is not reasonable for his surgery today but that we would minimize dosing and use multimodal analgesia. He is in agreement with the plan. Daiva Huge, MD )     Anesthesia Quick Evaluation

## 2021-12-06 DIAGNOSIS — Z959 Presence of cardiac and vascular implant and graft, unspecified: Secondary | ICD-10-CM | POA: Diagnosis not present

## 2021-12-08 ENCOUNTER — Encounter (HOSPITAL_COMMUNITY): Payer: Self-pay | Admitting: Neurological Surgery

## 2021-12-08 ENCOUNTER — Other Ambulatory Visit: Payer: Self-pay

## 2021-12-08 ENCOUNTER — Ambulatory Visit (HOSPITAL_BASED_OUTPATIENT_CLINIC_OR_DEPARTMENT_OTHER): Payer: Medicare HMO | Admitting: Certified Registered"

## 2021-12-08 ENCOUNTER — Encounter (HOSPITAL_COMMUNITY)
Admission: RE | Disposition: A | Discharge status: Home / Self Care | Payer: Self-pay | Source: Home / Self Care | Attending: Neurological Surgery

## 2021-12-08 ENCOUNTER — Ambulatory Visit (HOSPITAL_COMMUNITY): Payer: Medicare HMO

## 2021-12-08 ENCOUNTER — Observation Stay (HOSPITAL_COMMUNITY)
Admission: RE | Admit: 2021-12-08 | Discharge: 2021-12-09 | Disposition: A | Discharge status: Home / Self Care | Payer: Medicare HMO | Attending: Neurological Surgery | Admitting: Neurological Surgery

## 2021-12-08 ENCOUNTER — Ambulatory Visit (HOSPITAL_COMMUNITY): Payer: Medicare HMO | Admitting: Vascular Surgery

## 2021-12-08 DIAGNOSIS — E119 Type 2 diabetes mellitus without complications: Secondary | ICD-10-CM | POA: Insufficient documentation

## 2021-12-08 DIAGNOSIS — I10 Essential (primary) hypertension: Secondary | ICD-10-CM | POA: Insufficient documentation

## 2021-12-08 DIAGNOSIS — M4316 Spondylolisthesis, lumbar region: Secondary | ICD-10-CM | POA: Diagnosis not present

## 2021-12-08 DIAGNOSIS — M48061 Spinal stenosis, lumbar region without neurogenic claudication: Secondary | ICD-10-CM | POA: Insufficient documentation

## 2021-12-08 DIAGNOSIS — M6281 Muscle weakness (generalized): Secondary | ICD-10-CM | POA: Insufficient documentation

## 2021-12-08 DIAGNOSIS — Z87891 Personal history of nicotine dependence: Secondary | ICD-10-CM | POA: Diagnosis not present

## 2021-12-08 DIAGNOSIS — K7460 Unspecified cirrhosis of liver: Secondary | ICD-10-CM | POA: Insufficient documentation

## 2021-12-08 DIAGNOSIS — F32A Depression, unspecified: Secondary | ICD-10-CM | POA: Insufficient documentation

## 2021-12-08 DIAGNOSIS — M5416 Radiculopathy, lumbar region: Secondary | ICD-10-CM | POA: Diagnosis not present

## 2021-12-08 DIAGNOSIS — I252 Old myocardial infarction: Secondary | ICD-10-CM | POA: Insufficient documentation

## 2021-12-08 DIAGNOSIS — R2681 Unsteadiness on feet: Secondary | ICD-10-CM | POA: Insufficient documentation

## 2021-12-08 DIAGNOSIS — M47812 Spondylosis without myelopathy or radiculopathy, cervical region: Secondary | ICD-10-CM | POA: Insufficient documentation

## 2021-12-08 DIAGNOSIS — M4317 Spondylolisthesis, lumbosacral region: Secondary | ICD-10-CM | POA: Diagnosis not present

## 2021-12-08 DIAGNOSIS — M1990 Unspecified osteoarthritis, unspecified site: Secondary | ICD-10-CM | POA: Diagnosis not present

## 2021-12-08 DIAGNOSIS — Z981 Arthrodesis status: Secondary | ICD-10-CM | POA: Insufficient documentation

## 2021-12-08 DIAGNOSIS — M25551 Pain in right hip: Secondary | ICD-10-CM | POA: Insufficient documentation

## 2021-12-08 DIAGNOSIS — M545 Low back pain, unspecified: Secondary | ICD-10-CM | POA: Insufficient documentation

## 2021-12-08 DIAGNOSIS — I255 Ischemic cardiomyopathy: Secondary | ICD-10-CM | POA: Insufficient documentation

## 2021-12-08 DIAGNOSIS — K219 Gastro-esophageal reflux disease without esophagitis: Secondary | ICD-10-CM | POA: Diagnosis not present

## 2021-12-08 DIAGNOSIS — M47817 Spondylosis without myelopathy or radiculopathy, lumbosacral region: Secondary | ICD-10-CM | POA: Insufficient documentation

## 2021-12-08 DIAGNOSIS — I4891 Unspecified atrial fibrillation: Secondary | ICD-10-CM | POA: Diagnosis not present

## 2021-12-08 DIAGNOSIS — Z79899 Other long term (current) drug therapy: Secondary | ICD-10-CM | POA: Insufficient documentation

## 2021-12-08 DIAGNOSIS — D759 Disease of blood and blood-forming organs, unspecified: Secondary | ICD-10-CM | POA: Diagnosis not present

## 2021-12-08 DIAGNOSIS — G4730 Sleep apnea, unspecified: Secondary | ICD-10-CM | POA: Insufficient documentation

## 2021-12-08 DIAGNOSIS — M4326 Fusion of spine, lumbar region: Secondary | ICD-10-CM | POA: Diagnosis not present

## 2021-12-08 DIAGNOSIS — M79605 Pain in left leg: Secondary | ICD-10-CM | POA: Insufficient documentation

## 2021-12-08 DIAGNOSIS — M4807 Spinal stenosis, lumbosacral region: Secondary | ICD-10-CM | POA: Diagnosis not present

## 2021-12-08 DIAGNOSIS — Z833 Family history of diabetes mellitus: Secondary | ICD-10-CM | POA: Insufficient documentation

## 2021-12-08 DIAGNOSIS — M5417 Radiculopathy, lumbosacral region: Secondary | ICD-10-CM | POA: Diagnosis not present

## 2021-12-08 DIAGNOSIS — E1140 Type 2 diabetes mellitus with diabetic neuropathy, unspecified: Secondary | ICD-10-CM | POA: Insufficient documentation

## 2021-12-08 DIAGNOSIS — D649 Anemia, unspecified: Secondary | ICD-10-CM | POA: Diagnosis not present

## 2021-12-08 DIAGNOSIS — K7581 Nonalcoholic steatohepatitis (NASH): Secondary | ICD-10-CM | POA: Insufficient documentation

## 2021-12-08 DIAGNOSIS — R269 Unspecified abnormalities of gait and mobility: Secondary | ICD-10-CM | POA: Insufficient documentation

## 2021-12-08 DIAGNOSIS — Z8249 Family history of ischemic heart disease and other diseases of the circulatory system: Secondary | ICD-10-CM | POA: Diagnosis not present

## 2021-12-08 DIAGNOSIS — Z7984 Long term (current) use of oral hypoglycemic drugs: Secondary | ICD-10-CM | POA: Insufficient documentation

## 2021-12-08 DIAGNOSIS — I2510 Atherosclerotic heart disease of native coronary artery without angina pectoris: Secondary | ICD-10-CM | POA: Diagnosis not present

## 2021-12-08 HISTORY — PX: TRANSFORAMINAL LUMBAR INTERBODY FUSION W/ MIS 1 LEVEL: SHX6145

## 2021-12-08 LAB — GLUCOSE, CAPILLARY
Glucose-Capillary: 167 mg/dL — ABNORMAL HIGH (ref 70–99)
Glucose-Capillary: 195 mg/dL — ABNORMAL HIGH (ref 70–99)
Glucose-Capillary: 211 mg/dL — ABNORMAL HIGH (ref 70–99)
Glucose-Capillary: 186 mg/dL — ABNORMAL HIGH (ref 70–99)
Glucose-Capillary: 173 mg/dL — ABNORMAL HIGH (ref 70–99)

## 2021-12-08 LAB — HEMOGLOBIN A1C
Hgb A1c MFr Bld: 6.1 % — ABNORMAL HIGH (ref 4.8–5.6)
Mean Plasma Glucose: 128.37 mg/dL

## 2021-12-08 SURGERY — MINIMALLY INVASIVE (MIS) TRANSFORAMINAL LUMBAR INTERBODY FUSION (TLIF) 1 LEVEL
Anesthesia: General | Site: Spine Lumbar | Laterality: Left

## 2021-12-08 MED ORDER — PROMETHAZINE HCL 25 MG PO TABS
25.0000 mg | ORAL_TABLET | Freq: Four times a day (QID) | ORAL | Status: DC | PRN
Start: 2021-12-08 — End: 2021-12-09
  Administered 2021-12-08 – 2021-12-09 (×2): 25 mg via ORAL
  Filled 2021-12-08 (×3): qty 1

## 2021-12-08 MED ORDER — LIDOCAINE-EPINEPHRINE 1 %-1:100000 IJ SOLN
INTRAMUSCULAR | Status: AC
  Filled 2021-12-08: qty 1

## 2021-12-08 MED ORDER — TAMSULOSIN HCL 0.4 MG PO CAPS
0.4000 mg | ORAL_CAPSULE | Freq: Every day | ORAL | Status: DC
Start: 2021-12-08 — End: 2021-12-09
  Administered 2021-12-08: 0.4 mg via ORAL
  Filled 2021-12-08: qty 1

## 2021-12-08 MED ORDER — THROMBIN 5000 UNITS EX SOLR
CUTANEOUS | Status: AC
Start: 2021-12-08 — End: ?
  Filled 2021-12-08: qty 5000

## 2021-12-08 MED ORDER — PROPOFOL 10 MG/ML IV BOLUS
INTRAVENOUS | Status: AC
  Filled 2021-12-08: qty 20

## 2021-12-08 MED ORDER — EPHEDRINE SULFATE-NACL 50-0.9 MG/10ML-% IV SOSY
PREFILLED_SYRINGE | INTRAVENOUS | Status: DC | PRN
  Administered 2021-12-08 (×3): 5 mg via INTRAVENOUS

## 2021-12-08 MED ORDER — INSULIN ASPART 100 UNIT/ML IJ SOLN
0.0000 [IU] | INTRAMUSCULAR | Status: AC | PRN
  Administered 2021-12-08: 2 [IU] via SUBCUTANEOUS
  Administered 2021-12-08: 4 [IU] via SUBCUTANEOUS
  Filled 2021-12-08: qty 1

## 2021-12-08 MED ORDER — OXYCODONE HCL 5 MG PO TABS
10.0000 mg | ORAL_TABLET | ORAL | Status: DC | PRN
  Administered 2021-12-08 – 2021-12-09 (×5): 10 mg via ORAL
  Filled 2021-12-08 (×5): qty 2

## 2021-12-08 MED ORDER — ONDANSETRON HCL 4 MG PO TABS
4.0000 mg | ORAL_TABLET | Freq: Four times a day (QID) | ORAL | Status: DC | PRN
Start: 2021-12-08 — End: 2021-12-09

## 2021-12-08 MED ORDER — INSULIN ASPART 100 UNIT/ML IJ SOLN
0.0000 [IU] | Freq: Three times a day (TID) | INTRAMUSCULAR | Status: DC
  Administered 2021-12-08 – 2021-12-09 (×2): 3 [IU] via SUBCUTANEOUS

## 2021-12-08 MED ORDER — DILTIAZEM HCL ER 60 MG PO CP12: 120.0000 mg | ORAL_CAPSULE | ORAL | Status: DC

## 2021-12-08 MED ORDER — CHLORHEXIDINE GLUCONATE 0.12 % MT SOLN
15.0000 mL | Freq: Once | OROMUCOSAL | Status: AC
  Administered 2021-12-08: 15 mL via OROMUCOSAL
  Filled 2021-12-08: qty 15

## 2021-12-08 MED ORDER — ACETAMINOPHEN 650 MG RE SUPP: 650.0000 mg | RECTAL | Status: DC | PRN

## 2021-12-08 MED ORDER — CEFAZOLIN SODIUM-DEXTROSE 2-4 GM/100ML-% IV SOLN
2.0000 g | Freq: Three times a day (TID) | INTRAVENOUS | Status: AC
  Administered 2021-12-08 – 2021-12-09 (×2): 2 g via INTRAVENOUS
  Filled 2021-12-08 (×2): qty 100

## 2021-12-08 MED ORDER — PIOGLITAZONE HCL 15 MG PO TABS
15.0000 mg | ORAL_TABLET | Freq: Every day | ORAL | Status: DC
  Administered 2021-12-09: 15 mg via ORAL
  Filled 2021-12-08: qty 1

## 2021-12-08 MED ORDER — ORAL CARE MOUTH RINSE: 15.0000 mL | Freq: Once | OROMUCOSAL | Status: AC

## 2021-12-08 MED ORDER — SUGAMMADEX SODIUM 200 MG/2ML IV SOLN
INTRAVENOUS | Status: DC | PRN
  Administered 2021-12-08: 150 mg via INTRAVENOUS

## 2021-12-08 MED ORDER — LIDOCAINE 2% (20 MG/ML) 5 ML SYRINGE
INTRAMUSCULAR | Status: DC | PRN
  Administered 2021-12-08: 100 mg via INTRAVENOUS

## 2021-12-08 MED ORDER — PROPOFOL 10 MG/ML IV BOLUS
INTRAVENOUS | Status: DC | PRN
  Administered 2021-12-08: 140 mg via INTRAVENOUS

## 2021-12-08 MED ORDER — ACETAMINOPHEN 500 MG PO TABS
1000.0000 mg | ORAL_TABLET | Freq: Once | ORAL | Status: AC
  Administered 2021-12-08: 1000 mg via ORAL
  Filled 2021-12-08: qty 2

## 2021-12-08 MED ORDER — ONDANSETRON HCL 4 MG/2ML IJ SOLN
4.0000 mg | Freq: Four times a day (QID) | INTRAMUSCULAR | Status: DC | PRN
  Administered 2021-12-09: 4 mg via INTRAVENOUS
  Filled 2021-12-08: qty 2

## 2021-12-08 MED ORDER — ROCURONIUM BROMIDE 10 MG/ML (PF) SYRINGE
PREFILLED_SYRINGE | INTRAVENOUS | Status: AC
  Filled 2021-12-08: qty 10

## 2021-12-08 MED ORDER — BACLOFEN 10 MG PO TABS
10.0000 mg | ORAL_TABLET | Freq: Three times a day (TID) | ORAL | Status: DC | PRN
Start: 2021-12-08 — End: 2021-12-09
  Administered 2021-12-08 – 2021-12-09 (×2): 10 mg via ORAL
  Filled 2021-12-08 (×2): qty 1

## 2021-12-08 MED ORDER — HYDROMORPHONE HCL 1 MG/ML IJ SOLN
1.0000 mg | INTRAMUSCULAR | Status: DC | PRN
  Administered 2021-12-08: 1 mg via INTRAVENOUS
  Filled 2021-12-08: qty 1

## 2021-12-08 MED ORDER — LACTATED RINGERS IV SOLN: INTRAVENOUS | Status: DC

## 2021-12-08 MED ORDER — HYDROMORPHONE HCL 1 MG/ML IJ SOLN
INTRAMUSCULAR | Status: AC
  Filled 2021-12-08: qty 1

## 2021-12-08 MED ORDER — INSULIN ASPART 100 UNIT/ML IJ SOLN
4.0000 [IU] | Freq: Once | INTRAMUSCULAR | Status: AC
  Administered 2021-12-08: 4 [IU] via SUBCUTANEOUS

## 2021-12-08 MED ORDER — LORATADINE 10 MG PO TABS: 10.0000 mg | ORAL_TABLET | Freq: Every day | ORAL | Status: DC

## 2021-12-08 MED ORDER — FENTANYL CITRATE (PF) 250 MCG/5ML IJ SOLN
INTRAMUSCULAR | Status: DC | PRN
Start: 2021-12-08 — End: 2021-12-08
  Administered 2021-12-08: 100 ug via INTRAVENOUS
  Administered 2021-12-08: 50 ug via INTRAVENOUS

## 2021-12-08 MED ORDER — POLYETHYLENE GLYCOL 3350 17 G PO PACK: 17.0000 g | PACK | Freq: Every day | ORAL | Status: DC | PRN

## 2021-12-08 MED ORDER — OXYCODONE HCL 5 MG PO TABS: 5.0000 mg | ORAL_TABLET | ORAL | Status: DC | PRN

## 2021-12-08 MED ORDER — SODIUM CHLORIDE 0.9% FLUSH: 3.0000 mL | INTRAVENOUS | Status: DC | PRN

## 2021-12-08 MED ORDER — PHENYLEPHRINE 80 MCG/ML (10ML) SYRINGE FOR IV PUSH (FOR BLOOD PRESSURE SUPPORT)
PREFILLED_SYRINGE | INTRAVENOUS | Status: AC
  Filled 2021-12-08: qty 10

## 2021-12-08 MED ORDER — SODIUM CHLORIDE 0.9 % IV SOLN
250.0000 mL | INTRAVENOUS | Status: DC
  Administered 2021-12-08: 250 mL via INTRAVENOUS

## 2021-12-08 MED ORDER — MIDAZOLAM HCL 2 MG/2ML IJ SOLN
INTRAMUSCULAR | Status: DC | PRN
  Administered 2021-12-08: 2 mg via INTRAVENOUS

## 2021-12-08 MED ORDER — HYDROMORPHONE HCL 1 MG/ML IJ SOLN
0.2500 mg | INTRAMUSCULAR | Status: DC | PRN
  Administered 2021-12-08 (×2): 0.5 mg via INTRAVENOUS

## 2021-12-08 MED ORDER — MENTHOL 3 MG MT LOZG: 1.0000 | LOZENGE | OROMUCOSAL | Status: DC | PRN

## 2021-12-08 MED ORDER — ACETAMINOPHEN 325 MG PO TABS
650.0000 mg | ORAL_TABLET | ORAL | Status: DC | PRN
  Administered 2021-12-09 (×2): 650 mg via ORAL
  Filled 2021-12-08 (×2): qty 2

## 2021-12-08 MED ORDER — PROPOFOL 500 MG/50ML IV EMUL
INTRAVENOUS | Status: DC | PRN
  Administered 2021-12-08: 10 ug/kg/min via INTRAVENOUS

## 2021-12-08 MED ORDER — DEXAMETHASONE SODIUM PHOSPHATE 10 MG/ML IJ SOLN
INTRAMUSCULAR | Status: DC | PRN
  Administered 2021-12-08: 5 mg via INTRAVENOUS

## 2021-12-08 MED ORDER — MIDAZOLAM HCL 2 MG/2ML IJ SOLN
INTRAMUSCULAR | Status: AC
  Filled 2021-12-08: qty 2

## 2021-12-08 MED ORDER — ROSUVASTATIN CALCIUM 5 MG PO TABS
5.0000 mg | ORAL_TABLET | Freq: Every evening | ORAL | Status: DC
  Administered 2021-12-08: 5 mg via ORAL
  Filled 2021-12-08: qty 1

## 2021-12-08 MED ORDER — ESCITALOPRAM OXALATE 20 MG PO TABS
20.0000 mg | ORAL_TABLET | Freq: Every day | ORAL | Status: DC
  Administered 2021-12-08 – 2021-12-09 (×2): 20 mg via ORAL
  Filled 2021-12-08 (×2): qty 1

## 2021-12-08 MED ORDER — ONDANSETRON HCL 4 MG/2ML IJ SOLN
INTRAMUSCULAR | Status: DC | PRN
  Administered 2021-12-08: 4 mg via INTRAVENOUS

## 2021-12-08 MED ORDER — DEXAMETHASONE SODIUM PHOSPHATE 10 MG/ML IJ SOLN
INTRAMUSCULAR | Status: AC
  Filled 2021-12-08: qty 1

## 2021-12-08 MED ORDER — SODIUM CHLORIDE 0.9% FLUSH
3.0000 mL | Freq: Two times a day (BID) | INTRAVENOUS | Status: DC
  Administered 2021-12-08: 3 mL via INTRAVENOUS

## 2021-12-08 MED ORDER — GABAPENTIN 600 MG PO TABS
600.0000 mg | ORAL_TABLET | Freq: Three times a day (TID) | ORAL | Status: DC
  Administered 2021-12-08 – 2021-12-09 (×3): 600 mg via ORAL
  Filled 2021-12-08 (×3): qty 1

## 2021-12-08 MED ORDER — 0.9 % SODIUM CHLORIDE (POUR BTL) OPTIME
TOPICAL | Status: DC | PRN
  Administered 2021-12-08: 1000 mL

## 2021-12-08 MED ORDER — PANTOPRAZOLE SODIUM 40 MG PO TBEC
40.0000 mg | DELAYED_RELEASE_TABLET | Freq: Every day | ORAL | Status: DC
  Administered 2021-12-09: 40 mg via ORAL
  Filled 2021-12-08: qty 1

## 2021-12-08 MED ORDER — PHENYLEPHRINE 80 MCG/ML (10ML) SYRINGE FOR IV PUSH (FOR BLOOD PRESSURE SUPPORT)
PREFILLED_SYRINGE | INTRAVENOUS | Status: DC | PRN
  Administered 2021-12-08: 80 ug via INTRAVENOUS
  Administered 2021-12-08: 160 ug via INTRAVENOUS
  Administered 2021-12-08: 80 ug via INTRAVENOUS
  Administered 2021-12-08: 160 ug via INTRAVENOUS

## 2021-12-08 MED ORDER — PHENOL 1.4 % MT LIQD: 1.0000 | OROMUCOSAL | Status: DC | PRN

## 2021-12-08 MED ORDER — FENTANYL CITRATE (PF) 250 MCG/5ML IJ SOLN
INTRAMUSCULAR | Status: AC
  Filled 2021-12-08: qty 5

## 2021-12-08 MED ORDER — INSULIN ASPART 100 UNIT/ML IJ SOLN: 0.0000 [IU] | Freq: Every day | INTRAMUSCULAR | Status: DC

## 2021-12-08 MED ORDER — ROCURONIUM BROMIDE 10 MG/ML (PF) SYRINGE
PREFILLED_SYRINGE | INTRAVENOUS | Status: DC | PRN
  Administered 2021-12-08: 20 mg via INTRAVENOUS
  Administered 2021-12-08: 70 mg via INTRAVENOUS

## 2021-12-08 MED ORDER — CHLORHEXIDINE GLUCONATE CLOTH 2 % EX PADS: 6.0000 | MEDICATED_PAD | Freq: Once | CUTANEOUS | Status: DC

## 2021-12-08 MED ORDER — ROPINIROLE HCL 1 MG PO TABS
1.0000 mg | ORAL_TABLET | Freq: Every day | ORAL | Status: DC
  Administered 2021-12-08: 1 mg via ORAL
  Filled 2021-12-08: qty 1

## 2021-12-08 MED ORDER — CEFAZOLIN SODIUM-DEXTROSE 2-4 GM/100ML-% IV SOLN
2.0000 g | INTRAVENOUS | Status: AC
  Administered 2021-12-08: 2 g via INTRAVENOUS
  Filled 2021-12-08: qty 100

## 2021-12-08 MED ORDER — LIDOCAINE-EPINEPHRINE 1 %-1:100000 IJ SOLN
INTRAMUSCULAR | Status: DC | PRN
  Administered 2021-12-08: 10 mL

## 2021-12-08 MED ORDER — PROMETHAZINE HCL 25 MG PO TABS
25.0000 mg | ORAL_TABLET | Freq: Four times a day (QID) | ORAL | Status: DC | PRN
Start: 2021-12-08 — End: 2021-12-08

## 2021-12-08 MED ORDER — DOCUSATE SODIUM 100 MG PO CAPS
100.0000 mg | ORAL_CAPSULE | Freq: Two times a day (BID) | ORAL | Status: DC
  Administered 2021-12-08 – 2021-12-09 (×2): 100 mg via ORAL
  Filled 2021-12-08 (×2): qty 1

## 2021-12-08 MED ORDER — PHENYLEPHRINE HCL-NACL 20-0.9 MG/250ML-% IV SOLN
INTRAVENOUS | Status: DC | PRN
  Administered 2021-12-08: 50 ug/min via INTRAVENOUS

## 2021-12-08 MED ORDER — DEXMEDETOMIDINE (PRECEDEX) IN NS 20 MCG/5ML (4 MCG/ML) IV SYRINGE
PREFILLED_SYRINGE | INTRAVENOUS | Status: DC | PRN
  Administered 2021-12-08 (×3): 8 ug via INTRAVENOUS
  Administered 2021-12-08: 4 ug via INTRAVENOUS

## 2021-12-08 MED ORDER — LIDOCAINE 2% (20 MG/ML) 5 ML SYRINGE
INTRAMUSCULAR | Status: AC
  Filled 2021-12-08: qty 5

## 2021-12-08 MED ORDER — THROMBIN 5000 UNITS EX SOLR: OROMUCOSAL | Status: DC | PRN

## 2021-12-08 MED ORDER — ONDANSETRON HCL 4 MG/2ML IJ SOLN
INTRAMUSCULAR | Status: AC
  Filled 2021-12-08: qty 2

## 2021-12-08 MED ORDER — METFORMIN HCL ER 500 MG PO TB24
1000.0000 mg | ORAL_TABLET | Freq: Two times a day (BID) | ORAL | Status: DC
  Administered 2021-12-08 – 2021-12-09 (×2): 1000 mg via ORAL
  Filled 2021-12-08 (×2): qty 2

## 2021-12-08 MED ORDER — DROPERIDOL 2.5 MG/ML IJ SOLN: 0.6250 mg | Freq: Once | INTRAMUSCULAR | Status: DC | PRN

## 2021-12-08 MED ORDER — EPHEDRINE 5 MG/ML INJ
INTRAVENOUS | Status: AC
Start: 2021-12-08 — End: ?
  Filled 2021-12-08: qty 5

## 2021-12-08 SURGICAL SUPPLY — 66 items
ADH SKN CLS APL DERMABOND .7 (GAUZE/BANDAGES/DRESSINGS) ×1
BAG COUNTER SPONGE SURGICOUNT (BAG) ×3 IMPLANT
BAG SPNG CNTER NS LX DISP (BAG) ×1
BAND INSRT 18 STRL LF DISP RB (MISCELLANEOUS) ×2
BAND RUBBER #18 3X1/16 STRL (MISCELLANEOUS) ×6 IMPLANT
BASKET BONE COLLECTION (BASKET) ×3 IMPLANT
BLADE CLIPPER SURG (BLADE) IMPLANT
BLADE SURG 11 STRL SS (BLADE) ×3 IMPLANT
BUR MATCHSTICK NEURO 3.0 LAGG (BURR) IMPLANT
BUR ROUND FLUTED 4 SOFT TCH (BURR) ×3 IMPLANT
BUR ROUND PRECISION 4.0 (BURR) IMPLANT
CAGE EXP CATALYFT 9 (Plate) ×1 IMPLANT
CNTNR URN SCR LID CUP LEK RST (MISCELLANEOUS) ×2 IMPLANT
CONT SPEC 4OZ STRL OR WHT (MISCELLANEOUS) ×2
COVER BACK TABLE 60X90IN (DRAPES) ×3 IMPLANT
DERMABOND ADVANCED (GAUZE/BANDAGES/DRESSINGS) ×1
DERMABOND ADVANCED .7 DNX12 (GAUZE/BANDAGES/DRESSINGS) ×2 IMPLANT
DRAPE C-ARM 42X72 X-RAY (DRAPES) ×3 IMPLANT
DRAPE C-ARMOR (DRAPES) ×3 IMPLANT
DRAPE LAPAROTOMY 100X72X124 (DRAPES) ×3 IMPLANT
DRAPE MICROSCOPE LEICA (MISCELLANEOUS) ×3 IMPLANT
DRAPE SURG 17X23 STRL (DRAPES) ×6 IMPLANT
ELECT BLADE 6.5 EXT (BLADE) ×3 IMPLANT
ELECT REM PT RETURN 9FT ADLT (ELECTROSURGICAL) ×2
ELECTRODE REM PT RTRN 9FT ADLT (ELECTROSURGICAL) ×2 IMPLANT
EXTENDER TAB GUIDE SV 5.5/6.0 (INSTRUMENTS) ×8 IMPLANT
GAUZE 4X4 16PLY ~~LOC~~+RFID DBL (SPONGE) IMPLANT
GAUZE SPONGE 4X4 12PLY STRL (GAUZE/BANDAGES/DRESSINGS) ×3 IMPLANT
GLOVE BIOGEL PI IND STRL 7.5 (GLOVE) ×2 IMPLANT
GLOVE BIOGEL PI INDICATOR 7.5 (GLOVE) ×1
GLOVE ECLIPSE 7.5 STRL STRAW (GLOVE) ×3 IMPLANT
GOWN STRL REUS W/ TWL LRG LVL3 (GOWN DISPOSABLE) ×2 IMPLANT
GOWN STRL REUS W/ TWL XL LVL3 (GOWN DISPOSABLE) IMPLANT
GOWN STRL REUS W/TWL 2XL LVL3 (GOWN DISPOSABLE) IMPLANT
GOWN STRL REUS W/TWL LRG LVL3 (GOWN DISPOSABLE) ×2
GOWN STRL REUS W/TWL XL LVL3 (GOWN DISPOSABLE)
GUIDEWIRE BLUNT NT 450 (WIRE) ×4 IMPLANT
HEMOSTAT POWDER KIT SURGIFOAM (HEMOSTASIS) ×3 IMPLANT
KIT BASIN OR (CUSTOM PROCEDURE TRAY) ×3 IMPLANT
KIT POSITION SURG JACKSON T1 (MISCELLANEOUS) ×3 IMPLANT
KIT TURNOVER KIT B (KITS) IMPLANT
NDL BEVEL TWO-PAK W/1PK (NEEDLE) IMPLANT
NDL HYPO 18GX1.5 BLUNT FILL (NEEDLE) IMPLANT
NDL SPNL 18GX3.5 QUINCKE PK (NEEDLE) IMPLANT
NEEDLE BEVEL TWO-PAK W/1PK (NEEDLE) ×4 IMPLANT
NEEDLE HYPO 18GX1.5 BLUNT FILL (NEEDLE) IMPLANT
NEEDLE HYPO 22GX1.5 SAFETY (NEEDLE) ×3 IMPLANT
NEEDLE SPNL 18GX3.5 QUINCKE PK (NEEDLE) IMPLANT
NS IRRIG 1000ML POUR BTL (IV SOLUTION) ×3 IMPLANT
PACK LAMINECTOMY NEURO (CUSTOM PROCEDURE TRAY) ×3 IMPLANT
PAD ARMBOARD 7.5X6 YLW CONV (MISCELLANEOUS) ×6 IMPLANT
ROD 5.5 CCM PERC 40 (Rod) ×1 IMPLANT
ROD PERC CCM 5.5X35 (Rod) ×1 IMPLANT
SCREW MAS VOYAGER 6.5X40 (Screw) ×4 IMPLANT
SCREW SET 5.5/6.0MM SOLERA (Screw) ×4 IMPLANT
SPIKE FLUID TRANSFER (MISCELLANEOUS) ×3 IMPLANT
SPONGE T-LAP 4X18 ~~LOC~~+RFID (SPONGE) IMPLANT
SUT MNCRL AB 3-0 PS2 18 (SUTURE) ×3 IMPLANT
SUT VIC AB 0 CT1 18XCR BRD8 (SUTURE) IMPLANT
SUT VIC AB 0 CT1 8-18 (SUTURE)
SUT VIC AB 2-0 CP2 18 (SUTURE) ×3 IMPLANT
SYR 3ML LL SCALE MARK (SYRINGE) IMPLANT
TOWEL GREEN STERILE (TOWEL DISPOSABLE) ×3 IMPLANT
TOWEL GREEN STERILE FF (TOWEL DISPOSABLE) ×3 IMPLANT
TRAY FOLEY MTR SLVR 16FR STAT (SET/KITS/TRAYS/PACK) IMPLANT
WATER STERILE IRR 1000ML POUR (IV SOLUTION) ×3 IMPLANT

## 2021-12-08 NOTE — Progress Notes (Signed)
Orthopedic Tech Progress Note Patient Details:  Kristopher [name] 12-15-1950 [id number]  Ortho Devices Type of Ortho Device: Lumbar corsett Ortho Device/Splint Location: Back Ortho Device/Splint Interventions: Ordered    Dropped brace off to pt nurse pt was sleep.  [name] 12/08/2021, 7:55 PM

## 2021-12-08 NOTE — Anesthesia Postprocedure Evaluation (Signed)
Anesthesia Post Note  Patient: Kristopher [name]  Procedure(s) Performed: LEFT LUMBAR FIVE-SACRAL ONE MINIMALLY INVASIVE (MIS) TRANSFORAMINAL LUMBAR INTERBODY FUSION (Left: Spine Lumbar)     Patient location during evaluation: PACU Anesthesia Type: General Level of consciousness: awake and alert Pain management: pain level controlled Vital Signs Assessment: post-procedure vital signs reviewed and stable Respiratory status: spontaneous breathing, nonlabored ventilation and respiratory function stable Cardiovascular status: blood pressure returned to baseline Postop Assessment: no apparent nausea or vomiting Anesthetic complications: yes    Last Vitals:  Vitals:   12/08/21 1426 12/08/21 1430  BP: (!) 129/53   Pulse: 91 88  Resp: 19 13  Temp:  (!) 36.2 C  SpO2: 91% 94%          Marthenia Rolling

## 2021-12-08 NOTE — Anesthesia Procedure Notes (Signed)
Procedure Name: Intubation Date/Time: 12/08/2021 9:53 AM  Performed by: Gaylene Brooks, CRNAPre-anesthesia Checklist: Patient identified, Emergency Drugs available, Suction available and Patient being monitored Patient Re-evaluated:Patient Re-evaluated prior to induction Oxygen Delivery Method: Circle System Utilized Preoxygenation: Pre-oxygenation with 100% oxygen Induction Type: IV induction Ventilation: Mask ventilation without difficulty and Oral airway inserted - appropriate to patient size Laryngoscope Size: Glidescope and 3 Grade View: Grade I Tube type: Oral Tube size: 7.5 mm Number of attempts: 1 Airway Equipment and Method: Stylet and Oral airway Placement Confirmation: ETT inserted through vocal cords under direct vision, positive ETCO2 and breath sounds checked- equal and bilateral Secured at: 22 cm Tube secured with: Tape Dental Injury: Teeth and Oropharynx as per pre-operative assessment  Difficulty Due To: Difficulty was anticipated and Difficult Airway- due to reduced neck mobility Comments: Elective glidescope due to pt's very limited ROM in neck. Glottis easily visualized and ETT placed without difficulty.

## 2021-12-08 NOTE — Transfer of Care (Signed)
Immediate Anesthesia Transfer of Care Note  Patient: Kristopher [name]  Procedure(s) Performed: LEFT LUMBAR FIVE-SACRAL ONE MINIMALLY INVASIVE (MIS) TRANSFORAMINAL LUMBAR INTERBODY FUSION (Left: Spine Lumbar)  Patient Location: PACU  Anesthesia Type:General  Level of Consciousness: awake, drowsy and patient cooperative  Airway & Oxygen Therapy: Patient Spontanous Breathing and Patient connected to nasal cannula oxygen  Post-op Assessment: Report given to RN, Post -op Vital signs reviewed and stable and Patient moving all extremities X 4  Post vital signs: Reviewed and stable  Last Vitals:  Vitals Value Taken Time  BP 163/76 12/08/21 1311  Temp    Pulse 100 12/08/21 1314  Resp 14 12/08/21 1314  SpO2 95 % 12/08/21 1314  Vitals shown include unvalidated device data.  Last Pain:  Vitals:   12/08/21 0809  TempSrc:   PainSc: 4       Patients Stated Pain Goal: 0 (31/54/00 8676)  Complications:  Encounter Notable Events  Notable Event Outcome Phase Comment  Difficult to intubate - expected  Intraprocedure Filed from anesthesia note documentation.

## 2021-12-08 NOTE — H&P (Signed)
Surgical H&P Update  HPI: 71 y.o. with a history of low back and left lower extremity pain. Workup showed an L5-S1 sopndylolisthesis with left sided foraminal stenosis. No changes in health since they were last seen. Still having the above and wishes to proceed with surgery.  PMHx:  Past Medical History:  Diagnosis Date   Anemia 12/2020   Anginal pain ([place])    Atrial fibrillation ([place])    a. s/p PVI X2 by Dr Omelia Blackwater at [facility] around 2000.   CAD (coronary artery disease)    a. NSTEMI (06/2013):  LHC (06/27/2013):  pLAD 10-20, RCA 90, EF 45-50%, inf HK.  PCI:  Xience Alpine (3 x 23 mm) DES to the RCA. b. Relook cath 07/10/13 - patent stent. Imdur added. c. Recurrent CP after that - abnormal nuc 07/2017 with chest pain, dyspnea, hypotensive response. Rest images with mid-basal inferior thinning. No stress images due to abrupt d/c of test. Relook cath 3/12 - no significant r   Chronic back pain greater than 3 months duration    Cirrhosis, non-alcoholic (HCC)    Depression    DJD (degenerative joint disease) of cervical spine    "3 herniated discs" (09/01/2013)   DJD (degenerative joint disease), lumbosacral    "bulging L5-S1" (09/01/2013)   Dysrhythmia    PVCs. A.fib with two ablations   Herniated cervical disc    c2-T1 fusion   History of kidney stones    turned into urosepsis   HLD (hyperlipidemia)    Ischemic cardiomyopathy    a. Echo (06/28/2011): Inferior and inferoseptal akinesis, EF 74%, grade 2 diastolic dysfunction, MAC, small effusion (no tamponade). B. EF 40% by echo 06/2013, cath 07/2013, 55% by cath 08/2013 with subtle mild mid inferior hypocontractility.   Liver cirrhosis secondary to NASH [facility])    Neuromuscular disorder ([place])    bilateral neuropathy legs   NSTEMI (non-ST elevated myocardial infarction) ([place]) 06/27/2013   Osteoarthritis    "neck and lower back" (09/01/2013)   Pericardial effusion    a. Chronic since hx of pericarditis in 2004. b. Mod by echo 06/2013, small by echo  06/2013.   Pneumonia ~ 2005   Type II diabetes mellitus (HCC)    Vaso vagal episode    wears continuous loop recorder   FamHx:  Family History  Problem Relation Age of Onset   CAD Father    Sudden death Maternal Grandfather    COPD Other    Cancer Other    Diabetes Other    Heart disease Other    Arrhythmia Neg Hx    SocHx:  reports that he quit smoking about 14 years ago. His smoking use included cigarettes. He has a 64.50 pack-year smoking history. He has never used smokeless tobacco. He reports current alcohol use. He reports that he does not use drugs.  Physical Exam: Strength 5/5 x4 and SILTx4   Assesment/Plan: 71 y.o. man with L5-S1 spondy and foraminal stenosis with radiculopathy and LBP, here for L5-S1 MIS TLIF. Risks, benefits, and alternatives discussed and the patient would like to continue with surgery.  -OR today -3C post-op  Judith Part, MD 12/08/21 9:34 AM

## 2021-12-09 ENCOUNTER — Encounter (HOSPITAL_COMMUNITY): Payer: Self-pay | Admitting: Neurological Surgery

## 2021-12-09 DIAGNOSIS — Z8249 Family history of ischemic heart disease and other diseases of the circulatory system: Secondary | ICD-10-CM | POA: Diagnosis not present

## 2021-12-09 DIAGNOSIS — I2510 Atherosclerotic heart disease of native coronary artery without angina pectoris: Secondary | ICD-10-CM | POA: Diagnosis not present

## 2021-12-09 DIAGNOSIS — M5417 Radiculopathy, lumbosacral region: Secondary | ICD-10-CM | POA: Diagnosis not present

## 2021-12-09 DIAGNOSIS — M4807 Spinal stenosis, lumbosacral region: Secondary | ICD-10-CM | POA: Diagnosis not present

## 2021-12-09 DIAGNOSIS — M4317 Spondylolisthesis, lumbosacral region: Secondary | ICD-10-CM | POA: Diagnosis not present

## 2021-12-09 DIAGNOSIS — Z87891 Personal history of nicotine dependence: Secondary | ICD-10-CM | POA: Diagnosis not present

## 2021-12-09 DIAGNOSIS — M545 Low back pain, unspecified: Secondary | ICD-10-CM | POA: Diagnosis not present

## 2021-12-09 DIAGNOSIS — I252 Old myocardial infarction: Secondary | ICD-10-CM | POA: Diagnosis not present

## 2021-12-09 DIAGNOSIS — M79605 Pain in left leg: Secondary | ICD-10-CM | POA: Diagnosis not present

## 2021-12-09 LAB — GLUCOSE, CAPILLARY: Glucose-Capillary: 156 mg/dL — ABNORMAL HIGH (ref 70–99)

## 2021-12-09 MED ORDER — OXYCODONE HCL 5 MG PO TABS
5.0000 mg | ORAL_TABLET | ORAL | 0 refills | Status: DC | PRN
Start: 2021-12-09 — End: 2022-03-29

## 2021-12-09 MED ORDER — CHLORHEXIDINE GLUCONATE CLOTH 2 % EX PADS
6.0000 | MEDICATED_PAD | Freq: Every day | CUTANEOUS | Status: DC
Start: 2021-12-09 — End: 2021-12-09
  Administered 2021-12-09: 6 via TOPICAL

## 2021-12-09 MED ORDER — CLOPIDOGREL BISULFATE 75 MG PO TABS: 75.0000 mg | ORAL_TABLET | Freq: Every day | ORAL | Status: DC

## 2021-12-12 ENCOUNTER — Emergency Department (HOSPITAL_COMMUNITY): Payer: Medicare HMO

## 2021-12-12 ENCOUNTER — Encounter (HOSPITAL_COMMUNITY): Payer: Self-pay | Admitting: Emergency Medicine

## 2021-12-12 ENCOUNTER — Observation Stay (HOSPITAL_COMMUNITY)
Admission: EM | Admit: 2021-12-12 | Discharge: 2021-12-14 | Disposition: A | Discharge status: Home / Self Care | Payer: Medicare HMO | Attending: Family Medicine | Admitting: Family Medicine

## 2021-12-12 DIAGNOSIS — Z87891 Personal history of nicotine dependence: Secondary | ICD-10-CM | POA: Diagnosis not present

## 2021-12-12 DIAGNOSIS — Z7902 Long term (current) use of antithrombotics/antiplatelets: Secondary | ICD-10-CM | POA: Insufficient documentation

## 2021-12-12 DIAGNOSIS — R0902 Hypoxemia: Secondary | ICD-10-CM

## 2021-12-12 DIAGNOSIS — E7800 Pure hypercholesterolemia, unspecified: Secondary | ICD-10-CM | POA: Diagnosis not present

## 2021-12-12 DIAGNOSIS — Z20822 Contact with and (suspected) exposure to covid-19: Secondary | ICD-10-CM | POA: Insufficient documentation

## 2021-12-12 DIAGNOSIS — I110 Hypertensive heart disease with heart failure: Secondary | ICD-10-CM | POA: Diagnosis not present

## 2021-12-12 DIAGNOSIS — E119 Type 2 diabetes mellitus without complications: Secondary | ICD-10-CM | POA: Insufficient documentation

## 2021-12-12 DIAGNOSIS — Z79899 Other long term (current) drug therapy: Secondary | ICD-10-CM | POA: Insufficient documentation

## 2021-12-12 DIAGNOSIS — I480 Paroxysmal atrial fibrillation: Secondary | ICD-10-CM | POA: Diagnosis not present

## 2021-12-12 DIAGNOSIS — I2510 Atherosclerotic heart disease of native coronary artery without angina pectoris: Secondary | ICD-10-CM | POA: Diagnosis not present

## 2021-12-12 DIAGNOSIS — R112 Nausea with vomiting, unspecified: Secondary | ICD-10-CM

## 2021-12-12 DIAGNOSIS — Z955 Presence of coronary angioplasty implant and graft: Secondary | ICD-10-CM | POA: Insufficient documentation

## 2021-12-12 DIAGNOSIS — R509 Fever, unspecified: Secondary | ICD-10-CM | POA: Diagnosis not present

## 2021-12-12 DIAGNOSIS — R6510 Systemic inflammatory response syndrome (SIRS) of non-infectious origin without acute organ dysfunction: Secondary | ICD-10-CM

## 2021-12-12 DIAGNOSIS — Z7982 Long term (current) use of aspirin: Secondary | ICD-10-CM | POA: Insufficient documentation

## 2021-12-12 DIAGNOSIS — I498 Other specified cardiac arrhythmias: Secondary | ICD-10-CM | POA: Diagnosis present

## 2021-12-12 DIAGNOSIS — A084 Viral intestinal infection, unspecified: Secondary | ICD-10-CM | POA: Insufficient documentation

## 2021-12-12 DIAGNOSIS — R6520 Severe sepsis without septic shock: Secondary | ICD-10-CM | POA: Diagnosis not present

## 2021-12-12 DIAGNOSIS — I5032 Chronic diastolic (congestive) heart failure: Secondary | ICD-10-CM | POA: Diagnosis not present

## 2021-12-12 DIAGNOSIS — J90 Pleural effusion, not elsewhere classified: Secondary | ICD-10-CM | POA: Diagnosis not present

## 2021-12-12 DIAGNOSIS — K5730 Diverticulosis of large intestine without perforation or abscess without bleeding: Secondary | ICD-10-CM | POA: Diagnosis not present

## 2021-12-12 DIAGNOSIS — I4891 Unspecified atrial fibrillation: Secondary | ICD-10-CM | POA: Diagnosis present

## 2021-12-12 DIAGNOSIS — A419 Sepsis, unspecified organism: Principal | ICD-10-CM | POA: Insufficient documentation

## 2021-12-12 DIAGNOSIS — M4326 Fusion of spine, lumbar region: Secondary | ICD-10-CM | POA: Diagnosis not present

## 2021-12-12 DIAGNOSIS — R000 Tachycardia, unspecified: Secondary | ICD-10-CM | POA: Diagnosis not present

## 2021-12-12 DIAGNOSIS — J9601 Acute respiratory failure with hypoxia: Secondary | ICD-10-CM | POA: Diagnosis not present

## 2021-12-12 DIAGNOSIS — R0600 Dyspnea, unspecified: Secondary | ICD-10-CM | POA: Diagnosis not present

## 2021-12-12 DIAGNOSIS — K5900 Constipation, unspecified: Secondary | ICD-10-CM | POA: Diagnosis not present

## 2021-12-12 DIAGNOSIS — I3139 Other pericardial effusion (noninflammatory): Secondary | ICD-10-CM | POA: Diagnosis not present

## 2021-12-12 DIAGNOSIS — Z7984 Long term (current) use of oral hypoglycemic drugs: Secondary | ICD-10-CM | POA: Insufficient documentation

## 2021-12-12 DIAGNOSIS — J439 Emphysema, unspecified: Secondary | ICD-10-CM | POA: Diagnosis not present

## 2021-12-12 LAB — COMPREHENSIVE METABOLIC PANEL
ALT: 14 U/L (ref 0–44)
AST: 18 U/L (ref 15–41)
Albumin: 3.4 g/dL — ABNORMAL LOW (ref 3.5–5.0)
Alkaline Phosphatase: 71 U/L (ref 38–126)
Anion gap: 11 (ref 5–15)
BUN: 18 mg/dL (ref 8–23)
CO2: 29 mmol/L (ref 22–32)
Calcium: 9.2 mg/dL (ref 8.9–10.3)
Chloride: 101 mmol/L (ref 98–111)
Creatinine, Ser: 1.01 mg/dL (ref 0.61–1.24)
GFR, Estimated: >60 mL/min (ref >60)
Glucose, Bld: 182 mg/dL — ABNORMAL HIGH (ref 70–99)
Potassium: 3.6 mmol/L (ref 3.5–5.1)
Sodium: 141 mmol/L (ref 135–145)
Total Bilirubin: 1.5 mg/dL — ABNORMAL HIGH (ref 0.3–1.2)
Total Protein: 7 g/dL (ref 6.5–8.1)

## 2021-12-12 LAB — URINALYSIS, ROUTINE W REFLEX MICROSCOPIC
Bacteria, UA: NONE SEEN
Bilirubin Urine: NEGATIVE
Glucose, UA: >=500 mg/dL — AB
Ketones, ur: 20 mg/dL — AB
Leukocytes,Ua: NEGATIVE
Nitrite: NEGATIVE
Protein, ur: 30 mg/dL — AB
Specific Gravity, Urine: >1.046 — ABNORMAL HIGH (ref 1.005–1.030)
pH: 6 (ref 5.0–8.0)

## 2021-12-12 LAB — RESP PANEL BY RT-PCR (FLU A&B, COVID) ARPGX2
Influenza A by PCR: NEGATIVE
Influenza B by PCR: NEGATIVE
SARS Coronavirus 2 by RT PCR: NEGATIVE

## 2021-12-12 LAB — CBC WITH DIFFERENTIAL/PLATELET
Abs Immature Granulocytes: 0.01 10*3/uL (ref 0.00–0.07)
Basophils Absolute: 0 10*3/uL (ref 0.0–0.1)
Basophils Relative: 0 %
Eosinophils Absolute: 0 10*3/uL (ref 0.0–0.5)
Eosinophils Relative: 0 %
HCT: 29.9 % — ABNORMAL LOW (ref 39.0–52.0)
Hemoglobin: 10.2 g/dL — ABNORMAL LOW (ref 13.0–17.0)
Immature Granulocytes: 0 %
Lymphocytes Relative: 8 %
Lymphs Abs: 0.3 10*3/uL — ABNORMAL LOW (ref 0.7–4.0)
MCH: 31.5 pg (ref 26.0–34.0)
MCHC: 34.1 g/dL (ref 30.0–36.0)
MCV: 92.3 fL (ref 80.0–100.0)
Monocytes Absolute: 0.2 10*3/uL (ref 0.1–1.0)
Monocytes Relative: 6 %
Neutro Abs: 3.6 10*3/uL (ref 1.7–7.7)
Neutrophils Relative %: 86 %
Platelets: 164 10*3/uL (ref 150–400)
RBC: 3.24 MIL/uL — ABNORMAL LOW (ref 4.22–5.81)
RDW: 14.6 % (ref 11.5–15.5)
WBC: 4.2 10*3/uL (ref 4.0–10.5)
nRBC: 0 % (ref 0.0–0.2)

## 2021-12-12 LAB — LACTIC ACID, PLASMA: Lactic Acid, Venous: 0.9 mmol/L (ref 0.5–1.9)

## 2021-12-12 LAB — PROTIME-INR
INR: 1.1 (ref 0.8–1.2)
Prothrombin Time: 13.8 seconds (ref 11.4–15.2)

## 2021-12-12 LAB — D-DIMER, QUANTITATIVE: D-Dimer, Quant: 0.99 ug/mL-FEU — ABNORMAL HIGH (ref 0.00–0.50)

## 2021-12-12 LAB — LIPASE, BLOOD: Lipase: 19 U/L (ref 11–51)

## 2021-12-12 LAB — TROPONIN I (HIGH SENSITIVITY): Troponin I (High Sensitivity): 11 ng/L (ref <18)

## 2021-12-12 MED ORDER — SODIUM CHLORIDE 0.9 % IV SOLN
2.0000 g | Freq: Once | INTRAVENOUS | Status: DC
  Filled 2021-12-12: qty 12.5

## 2021-12-12 MED ORDER — KETOROLAC TROMETHAMINE 15 MG/ML IJ SOLN
15.0000 mg | Freq: Once | INTRAMUSCULAR | Status: AC
  Administered 2021-12-12: 15 mg via INTRAVENOUS
  Filled 2021-12-12: qty 1

## 2021-12-12 MED ORDER — METOCLOPRAMIDE HCL 5 MG/ML IJ SOLN
10.0000 mg | Freq: Once | INTRAMUSCULAR | Status: AC
  Administered 2021-12-12: 10 mg via INTRAVENOUS
  Filled 2021-12-12: qty 2

## 2021-12-12 MED ORDER — LACTATED RINGERS IV BOLUS (SEPSIS)
1000.0000 mL | Freq: Once | INTRAVENOUS | Status: AC
  Administered 2021-12-12: 1000 mL via INTRAVENOUS

## 2021-12-12 MED ORDER — ACETAMINOPHEN 325 MG PO TABS
650.0000 mg | ORAL_TABLET | Freq: Once | ORAL | Status: AC
  Administered 2021-12-12: 650 mg via ORAL
  Filled 2021-12-12: qty 2

## 2021-12-12 MED ORDER — METRONIDAZOLE 500 MG/100ML IV SOLN
500.0000 mg | Freq: Once | INTRAVENOUS | Status: AC
Start: 2021-12-12 — End: 2021-12-12
  Administered 2021-12-12: 500 mg via INTRAVENOUS
  Filled 2021-12-12: qty 100

## 2021-12-12 MED ORDER — LACTATED RINGERS IV BOLUS (SEPSIS)
250.0000 mL | Freq: Once | INTRAVENOUS | Status: AC
  Administered 2021-12-13: 250 mL via INTRAVENOUS

## 2021-12-12 MED ORDER — FENTANYL CITRATE PF 50 MCG/ML IJ SOSY
50.0000 ug | PREFILLED_SYRINGE | Freq: Once | INTRAMUSCULAR | Status: AC
  Administered 2021-12-12: 50 ug via INTRAVENOUS
  Filled 2021-12-12: qty 1

## 2021-12-12 MED ORDER — ONDANSETRON HCL 4 MG/2ML IJ SOLN
4.0000 mg | Freq: Once | INTRAMUSCULAR | Status: AC
  Administered 2021-12-12: 4 mg via INTRAVENOUS
  Filled 2021-12-12: qty 2

## 2021-12-12 MED ORDER — SODIUM CHLORIDE (PF) 0.9 % IJ SOLN
INTRAMUSCULAR | Status: AC
  Administered 2021-12-13: 10 mL
  Filled 2021-12-12: qty 50

## 2021-12-12 MED ORDER — VANCOMYCIN HCL IN DEXTROSE 1-5 GM/200ML-% IV SOLN
1000.0000 mg | Freq: Once | INTRAVENOUS | Status: AC
  Administered 2021-12-12: 1000 mg via INTRAVENOUS
  Filled 2021-12-12: qty 200

## 2021-12-12 MED ORDER — IOHEXOL 300 MG/ML  SOLN
100.0000 mL | Freq: Once | INTRAMUSCULAR | Status: AC | PRN
  Administered 2021-12-12: 100 mL via INTRAVENOUS

## 2021-12-12 MED ORDER — LACTATED RINGERS IV SOLN: INTRAVENOUS | Status: DC

## 2021-12-12 NOTE — ED Provider Notes (Signed)
[facility]-EMERGENCY DEPT Provider Note   CSN: [id number] Arrival date & time: 12/12/21  2019     History  Chief Complaint  Patient presents with   Emesis    Kristopher [name] is a 71 y.o. male.   Emesis Associated symptoms: fever   Patient presents for fever, nausea, and vomiting.  Onset was today.  Medical history includes CAD, atrial fibrillation, DM2, pericardial effusion, OSA, cirrhosis, RLS, urosepsis, HLD, HTN.  He recently underwent MIS laminectomy and TLIF of L4-S1 with Dr. Johnsie Cancel 4 days ago.  This morning, he had some nausea and vomiting.  He attributed this to the opiate medicine that he is taking postsurgery.  He denies any worsening pain to his lower back.  He did have continued nausea and vomiting throughout the day.  This evening, he developed a fever at home.  Tmax was 101.2 degrees.  Patient took Phenergan suppository without any relief of his nausea.  He has not been able to tolerate any p.o. intake throughout the day.  He denies any areas of discomfort other than his lower back postop pain.  He denies any urinary symptoms or recent cough.     Home Medications Prior to Admission medications   Medication Sig Start Date End Date Taking? Authorizing Provider  aspirin EC 81 MG tablet Take 81 mg by mouth every evening.     [provider]  baclofen (LIORESAL) 10 MG tablet Take 10 mg by mouth 3 (three) times daily as needed for muscle spasms.    [provider]  buPROPion (WELLBUTRIN XL) 150 MG 24 hr tablet Take 1 tablet (150 mg total) by mouth daily. Patient not taking: Reported on 11/23/2021 03/04/21   Regalado, Jon Billings A, MD  cetirizine (ZYRTEC) 10 MG tablet Take 10 mg by mouth daily as needed for allergies.    [provider]  clopidogrel (PLAVIX) 75 MG tablet Take 1 tablet (75 mg total) by mouth daily. Restart on 12/13/21 12/09/21   Jadene Pierini, MD  diltiazem (CARDIZEM SR) 120 MG 12 hr capsule Take 120 mg by mouth  every 36 (thirty-six) hours. 04/21/21   [provider]  escitalopram (LEXAPRO) 20 MG tablet Take 20 mg by mouth daily.    [provider]  gabapentin (NEURONTIN) 600 MG tablet Take 600 mg by mouth 3 (three) times daily.    [provider]  metFORMIN (GLUCOPHAGE-XR) 500 MG 24 hr tablet Take 1,000 mg by mouth 2 (two) times daily. 07/25/19   [provider]  Misc Natural Products (PUMPKIN SEED OIL) CAPS Take 1 capsule by mouth daily. 3000 mg    [provider]  Multiple Vitamins-Iron (MULTIVITAMINS WITH IRON) TABS tablet Take 1 tablet by mouth daily. Patient not taking: Reported on 07/11/2021 03/04/21   Hartley Barefoot A, MD  niacin 500 MG tablet Take 500 mg by mouth at bedtime.    [provider]  oxyCODONE (OXY IR/ROXICODONE) 5 MG immediate release tablet Take 1 tablet (5 mg total) by mouth every 4 (four) hours as needed (pain). 12/09/21   Jadene Pierini, MD  pantoprazole (PROTONIX) 40 MG tablet Take 40 mg by mouth daily. 08/08/19   [provider]  phenazopyridine (PYRIDIUM) 200 MG tablet Take 1 tablet (200 mg total) by mouth 3 (three) times daily as needed for pain. Patient not taking: Reported on 11/23/2021 07/21/21   Crist Fat, MD  pioglitazone (ACTOS) 15 MG tablet Take 15 mg by mouth daily. 07/14/19   [provider]  promethazine (PHENERGAN) 25 MG tablet Take 25 mg by mouth every 6 (six) hours as needed for nausea/vomiting. 06/21/21   [provider]  rOPINIRole (REQUIP) 1 MG tablet Take 1 mg by mouth at bedtime. 02/07/21   [provider]  rosuvastatin (CRESTOR) 5 MG tablet Take 5 mg by mouth every evening. 10/07/15   [provider]  Saw Palmetto 450 MG CAPS Take 450 mg by mouth at bedtime.    [provider]  tamsulosin (FLOMAX) 0.4 MG CAPS capsule Take 0.4 mg by mouth at bedtime. 02/17/21   [provider]      Allergies    Codeine, Fentanyl, Naproxen, and Other     Review of Systems   Review of Systems  Constitutional:  Positive for activity change, appetite change, fatigue and fever.  Gastrointestinal:  Positive for nausea and vomiting.  Musculoskeletal:  Positive for back pain.  All other systems reviewed and are negative.   Physical Exam Updated Vital Signs BP 128/69   Pulse (!) 101   Temp (!) 101.2 F (38.4 C)   Resp 19   SpO2 99%  Physical Exam Vitals and nursing note reviewed.  Constitutional:      General: He is not in acute distress.    Appearance: Normal appearance. He is well-developed and normal weight. He is ill-appearing. He is not toxic-appearing or diaphoretic.  HENT:     Head: Normocephalic and atraumatic.     Right Ear: External ear normal.     Left Ear: External ear normal.     Nose: Nose normal.     Mouth/Throat:     Mouth: Mucous membranes are moist.     Pharynx: Oropharynx is clear.  Eyes:     General: No scleral icterus.    Extraocular Movements: Extraocular movements intact.     Conjunctiva/sclera: Conjunctivae normal.  Cardiovascular:     Rate and Rhythm: Normal rate and regular rhythm.     Heart sounds: No murmur heard. Pulmonary:     Effort: Pulmonary effort is normal. No respiratory distress.     Breath sounds: Normal breath sounds. No wheezing or rales.  Chest:     Chest wall: No tenderness.  Abdominal:     General: There is no distension.     Palpations: Abdomen is soft.     Tenderness: There is no abdominal tenderness.  Musculoskeletal:        General: No swelling. Normal range of motion.     Cervical back: Normal range of motion and neck supple. No rigidity.     Right lower leg: No edema.     Left lower leg: No edema.     Comments: Well-healing postop scars on her lower back.  No surrounding erythema.  Skin:    General: Skin is warm and dry.     Capillary Refill: Capillary refill takes less than 2 seconds.     Coloration: Skin is not jaundiced or pale.  Neurological:     General: No  focal deficit present.     Mental Status: He is alert and oriented to person, place, and time.     Cranial Nerves: No cranial nerve deficit.     Sensory: No sensory deficit.     Motor: No weakness.     Coordination: Coordination normal.  Psychiatric:        Mood and Affect: Mood normal.        Behavior: Behavior normal.        Thought Content:  Thought content normal.        Judgment: Judgment normal.     ED Results / Procedures / Treatments   Labs (all labs ordered are listed, but only abnormal results are displayed) Labs Reviewed  COMPREHENSIVE METABOLIC PANEL - Abnormal; Notable for the following components:      Result Value   Glucose, Bld 182 (*)    Albumin 3.4 (*)    Total Bilirubin 1.5 (*)    All other components within normal limits  CBC WITH DIFFERENTIAL/PLATELET - Abnormal; Notable for the following components:   RBC 3.24 (*)    Hemoglobin 10.2 (*)    HCT 29.9 (*)    Lymphs Abs 0.3 (*)    All other components within normal limits  URINALYSIS, ROUTINE W REFLEX MICROSCOPIC - Abnormal; Notable for the following components:   Specific Gravity, Urine >1.046 (*)    Glucose, UA >=500 (*)    Hgb urine dipstick SMALL (*)    Ketones, ur 20 (*)    Protein, ur 30 (*)    All other components within normal limits  D-DIMER, QUANTITATIVE - Abnormal; Notable for the following components:   D-Dimer, Quant 0.99 (*)    All other components within normal limits  RESP PANEL BY RT-PCR (FLU A&B, COVID) ARPGX2  CULTURE, BLOOD (ROUTINE X 2)  CULTURE, BLOOD (ROUTINE X 2)  URINE CULTURE  LACTIC ACID, PLASMA  PROTIME-INR  LIPASE, BLOOD  LACTIC ACID, PLASMA  TROPONIN I (HIGH SENSITIVITY)  TROPONIN I (HIGH SENSITIVITY)    EKG EKG Interpretation  Date/Time:  Monday December 12 2021 20:43:20 EDT Ventricular Rate:  103 PR Interval:  148 QRS Duration: 93 QT Interval:  354 QTC Calculation: 464 R Axis:   65 Text Interpretation: Sinus tachycardia Ventricular premature complex Probable  left atrial enlargement Borderline T wave abnormalities Confirmed by Gloris Manchester ([id number]) on 12/12/2021 9:13:29 PM  Radiology CT L-SPINE NO CHARGE  Result Date: 12/12/2021 CLINICAL DATA:  Initial evaluation for fever, nausea, vomiting. Prior surgery. EXAM: CT LUMBAR SPINE WITHOUT CONTRAST TECHNIQUE: Multidetector CT imaging of the lumbar spine was performed without intravenous contrast administration. Multiplanar CT image reconstructions were also generated. RADIATION DOSE REDUCTION: This exam was performed according to the departmental dose-optimization program which includes automated exposure control, adjustment of the mA and/or kV according to patient size and/or use of iterative reconstruction technique. COMPARISON:  None Available. FINDINGS: Segmentation: Standard. Lowest well-formed disc space labeled the L5-S1 level. L5 vertebral body is partially sacralized on the left. Alignment: Physiologic with preservation of the normal lumbar lordosis. No listhesis. Vertebrae: Vertebral body height maintained without acute or chronic fracture. Visualized sacrum and pelvis intact. No worrisome osseous lesions. Postoperative changes from prior PLIF at L4-5. No visible hardware complication. Paraspinal and other soft tissues: Postoperative changes from recent effusion. Few scattered foci of residual postoperative emphysema noted. No visible collections or other complication. Aortic atherosclerosis. Disc levels: Prior PLIF at L4-5 without visible hardware complication. Examination otherwise fairly limited for evaluation of underlying spondylosis. IMPRESSION: 1. Postoperative changes from recent PLIF at L4-5. No visible hardware complication or other adverse features. 2. No other acute abnormality within the lumbar spine. 3. Aortic Atherosclerosis (ICD10-I70.0). Electronically Signed   By: Rise Mu M.D.   On: 12/12/2021 23:07   CT ABDOMEN PELVIS W CONTRAST  Result Date: 12/12/2021 CLINICAL DATA:  Nausea,  vomiting, fever.  Recent lumbar surgery EXAM: CT ABDOMEN AND PELVIS WITH CONTRAST TECHNIQUE: Multidetector CT imaging of the abdomen and pelvis was performed using the  standard protocol following bolus administration of intravenous contrast. RADIATION DOSE REDUCTION: This exam was performed according to the departmental dose-optimization program which includes automated exposure control, adjustment of the mA and/or kV according to patient size and/or use of iterative reconstruction technique. CONTRAST:  OMNIPAQUE IOHEXOL 300 MG/ML  SOLN COMPARISON:  02/23/2021 FINDINGS: Lower chest: Small pericardial effusion, similar to prior study. Diffuse coronary artery calcifications and scattered aortic calcifications. No acute abnormality. Hepatobiliary: No focal hepatic abnormality. Gallbladder unremarkable. Pancreas: No focal abnormality or ductal dilatation. Spleen: No focal abnormality.  Normal size. Adrenals/Urinary Tract: No suspicious renal or adrenal mass. No stones or hydronephrosis. Urinary bladder unremarkable. Stomach/Bowel: Scattered colonic diverticula. No active diverticulitis. Moderate stool burden. Stomach and small bowel unremarkable. No obstruction. Vascular/Lymphatic: Aortic atherosclerosis. No evidence of aneurysm or adenopathy. Reproductive: No visible focal abnormality. Other: No free fluid or free air. Musculoskeletal: Postoperative changes from posterior fusion in the lower lumbar spine. No acute bony abnormality. IMPRESSION: No acute findings in the abdomen or pelvis. Moderate stool burden. Scattered colonic diverticulosis. Electronically Signed   By: Charlett Nose M.D.   On: 12/12/2021 22:04   DG Chest Port 1 View  Result Date: 12/12/2021 CLINICAL DATA:  Fever with nausea and vomiting. EXAM: PORTABLE CHEST 1 VIEW COMPARISON:  March 01, 2021 FINDINGS: Multiple radiopaque cardiac lead wires are seen overlying the left lung. The heart size and mediastinal contours are within normal limits.  A radiopaque loop recorder device is seen overlying the mid left lung. Both lungs are clear. Postoperative changes are seen within the lower cervical spine. The visualized skeletal structures are unremarkable. IMPRESSION: No active cardiopulmonary disease. Electronically Signed   By: Aram Candela M.D.   On: 12/12/2021 21:00    Procedures Procedures    Medications Ordered in ED Medications  lactated ringers infusion (has no administration in time range)  lactated ringers bolus 1,000 mL (1,000 mLs Intravenous New Bag/Given 12/12/21 2114)    And  lactated ringers bolus 1,000 mL (1,000 mLs Intravenous New Bag/Given 12/12/21 2120)    And  lactated ringers bolus 250 mL (has no administration in time range)  ceFEPIme (MAXIPIME) 2 g in sodium chloride 0.9 % 100 mL IVPB (0 g Intravenous Paused 12/12/21 2116)  sodium chloride (PF) 0.9 % injection (has no administration in time range)  iohexol (OMNIPAQUE) 350 MG/ML injection 100 mL (has no administration in time range)  metroNIDAZOLE (FLAGYL) IVPB 500 mg (0 mg Intravenous Stopped 12/12/21 2222)  vancomycin (VANCOCIN) IVPB 1000 mg/200 mL premix (1,000 mg Intravenous New Bag/Given 12/12/21 2159)  ondansetron (ZOFRAN) injection 4 mg (4 mg Intravenous Given 12/12/21 2113)  acetaminophen (TYLENOL) tablet 650 mg (650 mg Oral Given 12/12/21 2318)  iohexol (OMNIPAQUE) 300 MG/ML solution 100 mL (100 mLs Intravenous Contrast Given 12/12/21 2137)  metoCLOPramide (REGLAN) injection 10 mg (10 mg Intravenous Given 12/12/21 2233)  ketorolac (TORADOL) 15 MG/ML injection 15 mg (15 mg Intravenous Given 12/12/21 2319)  fentaNYL (SUBLIMAZE) injection 50 mcg (50 mcg Intravenous Given 12/12/21 2319)    ED Course/ Medical Decision Making/ A&P                           Medical Decision Making Amount and/or Complexity of Data Reviewed Labs: ordered. Radiology: ordered. ECG/medicine tests: ordered.  Risk OTC drugs. Prescription drug management.   This patient presents  to the ED for concern of fever, nausea, and vomiting, this involves an extensive number of treatment options, and is a complaint  that carries with it a high risk of complications and morbidity.  The differential diagnosis includes sepsis, ACS, PE, enteritis, colitis, pyelonephritis   Co morbidities that complicate the patient evaluation  CAD, atrial fibrillation, DM2, pericardial effusion, OSA, cirrhosis, RLS, urosepsis, HLD, HTN   Additional history obtained:  Additional history obtained from patient's wife External records from outside source obtained and reviewed including EMR   Lab Tests:  I Ordered, and personally interpreted labs.  The pertinent results include: Normal findings: No leukocytosis, baseline anemia, normal electrolytes, normal lactate, normal troponin, no evidence of urinary infection.  His D-dimer was mildly elevated   Imaging Studies ordered:  I ordered imaging studies including CT of abdomen and pelvis, CT of lumbar spine, CTA chest I independently visualized and interpreted imaging which showed no acute findings on CT of abdomen and pelvis.  CT of lumbar spine showed expected postsurgical findings.  CTA was pending at time of signout. I agree with the radiologist interpretation   Cardiac Monitoring: / EKG:  The patient was maintained on a cardiac monitor.  I personally viewed and interpreted the cardiac monitored which showed an underlying rhythm of: Sinus rhythm  Problem List / ED Course / Critical interventions / Medication management  Patient is a 71 year old male presenting for nausea, vomiting, p.o. intolerance, and fevers.  Onset of symptoms was today.  He is postop day 4 from a recent lumbar spine surgery.  He denies any worsening pain in this region and, on exam, area appears to be well-healing with no overlying erythema.  Patient denies any abdominal pain and there is no tenderness appreciated on exam.  Rectal temperature was checked on arrival in the  ED and found to be 101.6 degrees.  He is also tachycardic and septic work-up and treatment was initiated.  Zofran was given for nausea.  On reassessment, patient continues to endorse nausea and Reglan was provided.  Following this, he did have resolution of nausea and was able to tolerate p.o. intake.  He does endorse low back pain and Toradol and fentanyl were given.  Patient has had nausea from opiate narcotics in the past.  He denies any worsening nausea following fentanyl.  Patient underwent a CT scan of abdomen pelvis which did not show any acute findings.  Following initial work-up, no source of infection was identified.  While in the ED, he did have hypoxia with SPO2 dropping into the 80s on room air.  He was placed on 2 L of supplemental oxygen.  Given his hypoxia and elevated D-dimer, patient to undergo CTA of chest.  Care of patient was signed out to oncoming ED provider. I ordered medication including IV fluids and broad-spectrum antibiotics for fever and tachycardia; Zofran and Reglan for nausea; Toradol and fentanyl for postoperative pain Reevaluation of the patient after these medicines showed that the patient improved I have reviewed the patients home medicines and have made adjustments as needed   Social Determinants of Health:  Has access to outpatient care         Final Clinical Impression(s) / ED Diagnoses Final diagnoses:  Nausea and vomiting, unspecified vomiting type  Fever in adult    Rx / DC Orders ED Discharge Orders     None         Gloris Manchester, MD 12/13/21 0010

## 2021-12-13 ENCOUNTER — Emergency Department (HOSPITAL_COMMUNITY): Payer: Medicare HMO

## 2021-12-13 ENCOUNTER — Other Ambulatory Visit: Payer: Self-pay

## 2021-12-13 ENCOUNTER — Encounter (HOSPITAL_COMMUNITY): Payer: Self-pay | Admitting: Emergency Medicine

## 2021-12-13 DIAGNOSIS — Z9889 Other specified postprocedural states: Secondary | ICD-10-CM

## 2021-12-13 DIAGNOSIS — I2510 Atherosclerotic heart disease of native coronary artery without angina pectoris: Secondary | ICD-10-CM

## 2021-12-13 DIAGNOSIS — I3139 Other pericardial effusion (noninflammatory): Secondary | ICD-10-CM | POA: Diagnosis not present

## 2021-12-13 DIAGNOSIS — K5900 Constipation, unspecified: Secondary | ICD-10-CM | POA: Diagnosis not present

## 2021-12-13 DIAGNOSIS — R6510 Systemic inflammatory response syndrome (SIRS) of non-infectious origin without acute organ dysfunction: Secondary | ICD-10-CM

## 2021-12-13 DIAGNOSIS — R509 Fever, unspecified: Secondary | ICD-10-CM

## 2021-12-13 DIAGNOSIS — I480 Paroxysmal atrial fibrillation: Secondary | ICD-10-CM

## 2021-12-13 DIAGNOSIS — E119 Type 2 diabetes mellitus without complications: Secondary | ICD-10-CM

## 2021-12-13 DIAGNOSIS — I5032 Chronic diastolic (congestive) heart failure: Secondary | ICD-10-CM | POA: Diagnosis not present

## 2021-12-13 DIAGNOSIS — J9601 Acute respiratory failure with hypoxia: Secondary | ICD-10-CM | POA: Diagnosis not present

## 2021-12-13 DIAGNOSIS — E785 Hyperlipidemia, unspecified: Secondary | ICD-10-CM

## 2021-12-13 DIAGNOSIS — R112 Nausea with vomiting, unspecified: Secondary | ICD-10-CM | POA: Diagnosis not present

## 2021-12-13 DIAGNOSIS — R000 Tachycardia, unspecified: Secondary | ICD-10-CM | POA: Diagnosis not present

## 2021-12-13 DIAGNOSIS — R0600 Dyspnea, unspecified: Secondary | ICD-10-CM | POA: Diagnosis not present

## 2021-12-13 LAB — GLUCOSE, CAPILLARY
Glucose-Capillary: 153 mg/dL — ABNORMAL HIGH (ref 70–99)
Glucose-Capillary: 124 mg/dL — ABNORMAL HIGH (ref 70–99)

## 2021-12-13 LAB — TROPONIN I (HIGH SENSITIVITY): Troponin I (High Sensitivity): 11 ng/L (ref <18)

## 2021-12-13 LAB — LACTIC ACID, PLASMA: Lactic Acid, Venous: 0.9 mmol/L (ref 0.5–1.9)

## 2021-12-13 MED ORDER — ENOXAPARIN SODIUM 40 MG/0.4ML IJ SOSY
40.0000 mg | PREFILLED_SYRINGE | INTRAMUSCULAR | Status: DC
  Administered 2021-12-13 – 2021-12-14 (×2): 40 mg via SUBCUTANEOUS
  Filled 2021-12-13 (×2): qty 0.4

## 2021-12-13 MED ORDER — OXYCODONE HCL 5 MG PO TABS
5.0000 mg | ORAL_TABLET | ORAL | Status: DC | PRN
  Administered 2021-12-13 – 2021-12-14 (×8): 5 mg via ORAL
  Filled 2021-12-13 (×8): qty 1

## 2021-12-13 MED ORDER — VANCOMYCIN HCL 1500 MG/300ML IV SOLN
1500.0000 mg | INTRAVENOUS | Status: DC
  Administered 2021-12-13 – 2021-12-14 (×2): 1500 mg via INTRAVENOUS
  Filled 2021-12-13 (×2): qty 300

## 2021-12-13 MED ORDER — IOHEXOL 350 MG/ML SOLN
100.0000 mL | Freq: Once | INTRAVENOUS | Status: AC | PRN
  Administered 2021-12-13: 80 mL via INTRAVENOUS

## 2021-12-13 MED ORDER — BISACODYL 10 MG RE SUPP: 10.0000 mg | Freq: Once | RECTAL | Status: DC

## 2021-12-13 MED ORDER — LACTATED RINGERS IV SOLN: INTRAVENOUS | Status: DC

## 2021-12-13 MED ORDER — FUROSEMIDE 10 MG/ML IJ SOLN
20.0000 mg | Freq: Once | INTRAMUSCULAR | Status: AC
  Administered 2021-12-13: 20 mg via INTRAVENOUS
  Filled 2021-12-13: qty 2

## 2021-12-13 MED ORDER — METRONIDAZOLE 500 MG/100ML IV SOLN
500.0000 mg | Freq: Two times a day (BID) | INTRAVENOUS | Status: DC
  Administered 2021-12-13 – 2021-12-14 (×3): 500 mg via INTRAVENOUS
  Filled 2021-12-13 (×3): qty 100

## 2021-12-13 MED ORDER — METHOCARBAMOL 500 MG PO TABS
500.0000 mg | ORAL_TABLET | Freq: Once | ORAL | Status: AC
Start: 2021-12-14 — End: 2021-12-14
  Administered 2021-12-14: 500 mg via ORAL
  Filled 2021-12-13: qty 1

## 2021-12-13 MED ORDER — ACETAMINOPHEN 325 MG PO TABS: 650.0000 mg | ORAL_TABLET | Freq: Four times a day (QID) | ORAL | Status: DC | PRN

## 2021-12-13 MED ORDER — SODIUM CHLORIDE 0.9 % IV SOLN
2.0000 g | Freq: Three times a day (TID) | INTRAVENOUS | Status: DC
  Administered 2021-12-13 – 2021-12-14 (×4): 2 g via INTRAVENOUS
  Filled 2021-12-13 (×4): qty 12.5

## 2021-12-13 MED ORDER — ONDANSETRON HCL 4 MG/2ML IJ SOLN
4.0000 mg | Freq: Four times a day (QID) | INTRAMUSCULAR | Status: DC | PRN
  Administered 2021-12-14: 4 mg via INTRAVENOUS
  Filled 2021-12-13: qty 2

## 2021-12-13 MED ORDER — ACETAMINOPHEN 650 MG RE SUPP: 650.0000 mg | Freq: Four times a day (QID) | RECTAL | Status: DC | PRN

## 2021-12-13 MED ORDER — POLYETHYLENE GLYCOL 3350 17 G PO PACK
17.0000 g | PACK | Freq: Every day | ORAL | Status: DC | PRN
Start: 2021-12-13 — End: 2021-12-14
  Administered 2021-12-13: 17 g via ORAL
  Filled 2021-12-13: qty 1

## 2021-12-13 MED ORDER — INSULIN ASPART 100 UNIT/ML IJ SOLN: 0.0000 [IU] | Freq: Three times a day (TID) | INTRAMUSCULAR | Status: DC

## 2021-12-13 NOTE — ED Notes (Signed)
Receiving RN [facility] has agreed to accept [facility] once pt has arrived to inpatient unit, all questions and concerns address.

## 2021-12-13 NOTE — ED Notes (Signed)
Secure message sent to receiving RN [facility] , awaiting response. Pt has an inpatient bed at this time

## 2021-12-14 DIAGNOSIS — E1142 Type 2 diabetes mellitus with diabetic polyneuropathy: Secondary | ICD-10-CM

## 2021-12-14 DIAGNOSIS — I4891 Unspecified atrial fibrillation: Secondary | ICD-10-CM | POA: Diagnosis not present

## 2021-12-14 DIAGNOSIS — R6510 Systemic inflammatory response syndrome (SIRS) of non-infectious origin without acute organ dysfunction: Secondary | ICD-10-CM | POA: Diagnosis not present

## 2021-12-14 DIAGNOSIS — J9601 Acute respiratory failure with hypoxia: Secondary | ICD-10-CM | POA: Diagnosis not present

## 2021-12-14 DIAGNOSIS — E7800 Pure hypercholesterolemia, unspecified: Secondary | ICD-10-CM | POA: Diagnosis not present

## 2021-12-14 LAB — GLUCOSE, CAPILLARY
Glucose-Capillary: 112 mg/dL — ABNORMAL HIGH (ref 70–99)
Glucose-Capillary: 113 mg/dL — ABNORMAL HIGH (ref 70–99)

## 2021-12-14 LAB — BASIC METABOLIC PANEL
Anion gap: 11 (ref 5–15)
BUN: 18 mg/dL (ref 8–23)
CO2: 30 mmol/L (ref 22–32)
Calcium: 8.9 mg/dL (ref 8.9–10.3)
Chloride: 98 mmol/L (ref 98–111)
Creatinine, Ser: 0.89 mg/dL (ref 0.61–1.24)
GFR, Estimated: >60 mL/min (ref >60)
Glucose, Bld: 125 mg/dL — ABNORMAL HIGH (ref 70–99)
Potassium: 3.3 mmol/L — ABNORMAL LOW (ref 3.5–5.1)
Sodium: 139 mmol/L (ref 135–145)

## 2021-12-14 LAB — CBC
HCT: 28.3 % — ABNORMAL LOW (ref 39.0–52.0)
Hemoglobin: 9.4 g/dL — ABNORMAL LOW (ref 13.0–17.0)
MCH: 30.9 pg (ref 26.0–34.0)
MCHC: 33.2 g/dL (ref 30.0–36.0)
MCV: 93.1 fL (ref 80.0–100.0)
Platelets: 151 10*3/uL (ref 150–400)
RBC: 3.04 MIL/uL — ABNORMAL LOW (ref 4.22–5.81)
RDW: 14.4 % (ref 11.5–15.5)
WBC: 4.2 10*3/uL (ref 4.0–10.5)
nRBC: 0 % (ref 0.0–0.2)

## 2021-12-14 LAB — URINE CULTURE: Culture: NO GROWTH

## 2021-12-14 MED ORDER — PROMETHAZINE HCL 25 MG PO TABS
12.5000 mg | ORAL_TABLET | Freq: Once | ORAL | Status: AC
Start: 2021-12-14 — End: 2021-12-14
  Administered 2021-12-14: 12.5 mg via ORAL
  Filled 2021-12-14: qty 1

## 2021-12-14 MED ORDER — POTASSIUM CHLORIDE CRYS ER 20 MEQ PO TBCR
40.0000 meq | EXTENDED_RELEASE_TABLET | Freq: Once | ORAL | Status: AC
  Administered 2021-12-14: 40 meq via ORAL
  Filled 2021-12-14: qty 2

## 2021-12-14 NOTE — TOC Progression Note (Signed)
Transition of Care [facility]) - Progression Note    Patient Details  Name: KASHAWN DIRR MRN: [id number] Date of Birth: 04-07-1951  Transition of Care [facility] CM/SW Contact  Purcell Mouton, RN Phone Number: 12/14/2021, 9:47 AM  Clinical Narrative:     Spoke with pt's wife concerning discharge needs. There are no needs at present time.    Expected Discharge Plan: Home/Self Care Barriers to Discharge: No Barriers Identified  Expected Discharge Plan and Services Expected Discharge Plan: Home/Self Care       Living arrangements for the past 2 months: Single Family Home                                       Social Determinants of Health (SDOH) Interventions    Readmission Risk Interventions    03/04/2021    8:19 AM  Readmission Risk Prevention Plan  Transportation Screening Not Complete  PCP or Specialist Appt within 5-7 Days Not Complete  Home Care Screening Not Complete  Medication Review (RN CM) Not Complete

## 2021-12-14 NOTE — Plan of Care (Signed)
  Problem: Clinical Measurements: Goal: Ability to maintain clinical measurements within normal limits will improve Outcome: Progressing Goal: Will remain free from infection Outcome: Progressing   Problem: Activity: Goal: Risk for activity intolerance will decrease Outcome: Progressing   Problem: Pain Managment: Goal: General experience of comfort will improve Outcome: Progressing

## 2021-12-14 NOTE — Discharge Summary (Signed)
Physician Discharge Summary   Patient: Kristopher [name] MRN: [id number] DOB: Sep 18, 1950  Admit date:     12/12/2021  Discharge date: 12/14/21  Discharge Physician: Patrecia Pour   PCP: Townsend Roger, MD   Recommendations at discharge:  Follow up with PCP per routine Follow up with Dr. Zada Finders s/p surgery on 6/22.  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  Also found to be hypoxemic with O2 sats in 80s, placed on supplemental oxygen.   Chest x-ray was negative for pneumonia. D-dimer was 0.99. CTA chest was negative for PE or pneumonia. CT abdomen/pelvis was negative for acute process. CT lumbar spine showed only postop changes and no abscess or other abnormality.   Patient started on empiric vancomycin, metronidazole, cefepime empirically. Blood cultures have remained without growth and symptoms have resolved. He was given lasix x1 and hypoxia has resolved.   Assessment and Plan: Fever: With lymphopenia and otherwise negative work up, suspect viral infection caused sepsis syndrome which has since resolved. Will DC antibiotics and discharge in stable condition with return precautions.    Acute hypoxic respiratory failure: Unclear etiology, though has resolved. Was given a dose of lasix, so suspect a mild acute on chronic HFpEF (echo Sept 2022 showed G2DD). He appears euvolemic on day of  discharge and no longer hypoxemic.    Recent L5-S1 MIS laminectomy and TLIF on 12/09/2021 CT with stable findings.  -Continue oxycodone as needed for pain   Constipation: continue regular bowel regimen   Consultants: None Procedures performed: None  Disposition: Home Diet recommendation:  Cardiac diet DISCHARGE MEDICATION: Allergies as of 12/14/2021       Reactions   Codeine Nausea And Vomiting   Duragesic-100 [fentanyl] Nausea And Vomiting   Naprosyn [naproxen] Nausea Only   Other Nausea And Vomiting   1. N/V to all opiates. He is able to take versed. (noted 09/29/13) 2.The patient states that he has had  a reaction to all narcotics. He can take them though. (noted 06/26/13)        Medication List     STOP taking these medications    buPROPion 150 MG 24 hr tablet Commonly known as: WELLBUTRIN XL   phenazopyridine 200 MG tablet Commonly known as: Pyridium       TAKE these medications    aspirin EC 81 MG tablet Take 81 mg by mouth every evening.   baclofen 10 MG tablet Commonly known as: LIORESAL Take 10 mg by mouth 3 (three) times daily as needed for muscle spasms.   cetirizine 10 MG tablet Commonly known as: ZYRTEC Take 10 mg by mouth daily as needed for allergies.   clopidogrel 75 MG tablet Commonly known as: PLAVIX Take 1 tablet (75 mg total) by mouth daily. Restart on 12/13/21   diltiazem 120 MG 12 hr capsule Commonly known as: CARDIZEM SR Take 120 mg by mouth every 36 (thirty-six) hours.   escitalopram 20 MG tablet Commonly  known as: LEXAPRO Take 20 mg by mouth daily.   gabapentin 600 MG tablet Commonly known as: NEURONTIN Take 600 mg by mouth 3 (three) times daily.   ibuprofen 200 MG tablet Commonly known as: ADVIL Take 800 mg by mouth daily as needed for mild pain.   metFORMIN 500 MG 24 hr tablet Commonly known as: GLUCOPHAGE-XR Take 1,000 mg by mouth 2 (two) times daily.   multivitamins with iron Tabs tablet Take 1 tablet by mouth  daily.   niacin 500 MG tablet Take 500 mg by mouth at bedtime.   oxyCODONE 5 MG immediate release tablet Commonly known as: Oxy IR/ROXICODONE Take 1 tablet (5 mg total) by mouth every 4 (four) hours as needed (pain).   pantoprazole 40 MG tablet Commonly known as: PROTONIX Take 40 mg by mouth daily.   pioglitazone 15 MG tablet Commonly known as: ACTOS Take 15 mg by mouth daily.   promethazine 25 MG tablet Commonly known as: PHENERGAN Take 25 mg by mouth every 6 (six) hours as needed for nausea/vomiting.   Pumpkin Seed Oil Caps Take 1 capsule by mouth daily. 3000 mg   rOPINIRole 1 MG tablet Commonly known as: REQUIP Take 1 mg by mouth at bedtime.   rosuvastatin 5 MG tablet Commonly known as: CRESTOR Take 5 mg by mouth every evening.   Saw Palmetto 450 MG Caps Take 450 mg by mouth at bedtime.   tamsulosin 0.4 MG Caps capsule Commonly known as: FLOMAX Take 0.4 mg by mouth at bedtime.               Discharge Care Instructions  (From admission, onward)           Start     Ordered   12/14/21 0000  No dressing needed        12/14/21 1224            Follow-up Information     Townsend Roger, MD Follow up.   Specialty: Internal Medicine Contact information: [address] [place] [address] [phone]                Discharge Exam: Kristopher [name] Weights   12/13/21 0327  Weight: 73.8 kg  BP 125/72 (BP Location: Left Arm)   Pulse 83   Temp 97.8 F (36.6 C) (Oral)   Resp 18   Ht 5' 7"  (1.702 m)   Wt 73.8 kg   SpO2 95%   BMI 25.48 kg/m   Well-appearing male in no distress Lower back incisions c/d/I well healing without significant erythema or any discharge, appropriately, mildly tender RRR, no MRG or pitting edema CTAB, nonlabored Soft, NT, ND  Condition at discharge: stable  The results of significant diagnostics from this hospitalization (including imaging, microbiology, ancillary and laboratory) are listed below for reference.    Imaging Studies: CT Angio Chest PE W and/or Wo Contrast  Result Date: 12/13/2021 CLINICAL DATA:  Pulmonary embolism (PE) suspected, high prob. Dyspnea. EXAM: CT ANGIOGRAPHY CHEST WITH CONTRAST TECHNIQUE: Multidetector CT imaging of the chest was performed using the standard protocol during bolus administration of intravenous contrast. Multiplanar CT image reconstructions and MIPs were obtained to evaluate the vascular anatomy. RADIATION DOSE REDUCTION: This exam was performed according to the departmental dose-optimization program which includes automated exposure control, adjustment of the mA and/or kV according to patient size and/or use of iterative reconstruction technique. CONTRAST:  14m OMNIPAQUE IOHEXOL 350 MG/ML SOLN COMPARISON:  09/01/2013 FINDINGS: Cardiovascular: Extensive multi-vessel coronary artery calcification.  Global cardiac size within normal limits. Small pericardial effusion, decreased in size when compared to prior examination. Central pulmonary arteries are of normal caliber. No intraluminal filling defect identified to suggest acute pulmonary embolism. Moderate atherosclerotic calcification within the thoracic aorta. No aortic aneurysm. Mediastinum/Nodes: No enlarged mediastinal, hilar, or axillary lymph nodes. Thyroid gland, trachea, and esophagus demonstrate no significant findings. Lungs/Pleura: Lungs are clear. No pleural effusion or pneumothorax. Upper Abdomen: Mild splenomegaly is partially visualized with the spleen measuring at least 14.4 cm in greatest dimension. This appears slightly progressive since prior examination. No acute abnormality identified within the upper abdomen. Musculoskeletal: No acute bone abnormality Review of the MIP images confirms the above findings. IMPRESSION: 1. No pulmonary embolism. 2. Extensive multi-vessel coronary artery calcification. 3. Small pericardial effusion, decreased in size when compared to prior examination. 4. Mild splenomegaly,  slightly progressive since prior examination. Electronically Signed   By: Fidela Salisbury M.D.   On: 12/13/2021 00:26   CT L-SPINE NO CHARGE  Result Date: 12/12/2021 CLINICAL DATA:  Initial evaluation for fever, nausea, vomiting. Prior surgery. EXAM: CT LUMBAR SPINE WITHOUT CONTRAST TECHNIQUE: Multidetector CT imaging of the lumbar spine was performed without intravenous contrast administration. Multiplanar CT image reconstructions were also generated. RADIATION DOSE REDUCTION: This exam was performed according to the departmental dose-optimization program which includes automated exposure control, adjustment of the mA and/or kV according to patient size and/or use of iterative reconstruction technique. COMPARISON:  None Available. FINDINGS: Segmentation: Standard. Lowest well-formed disc space labeled the L5-S1 level. L5 vertebral body is partially sacralized on the left. Alignment: Physiologic with preservation of the normal lumbar lordosis. No listhesis. Vertebrae: Vertebral body height maintained without acute or chronic fracture. Visualized sacrum and pelvis intact. No worrisome osseous lesions. Postoperative changes from prior PLIF at L4-5. No visible hardware complication. Paraspinal and other soft tissues: Postoperative changes from recent effusion. Few scattered foci of residual postoperative emphysema noted. No visible collections or other complication. Aortic atherosclerosis. Disc levels: Prior PLIF at L4-5 without visible hardware complication. Examination otherwise fairly limited for evaluation of underlying spondylosis. IMPRESSION: 1. Postoperative changes from recent PLIF at L4-5. No visible hardware complication or other adverse features. 2. No other acute abnormality within the lumbar spine. 3. Aortic Atherosclerosis (ICD10-I70.0). Electronically Signed   By: Jeannine Boga M.D.   On: 12/12/2021 23:07   CT ABDOMEN PELVIS W CONTRAST  Result Date: 12/12/2021 CLINICAL DATA:  Nausea,  vomiting, fever.  Recent lumbar surgery EXAM: CT ABDOMEN AND PELVIS WITH CONTRAST TECHNIQUE: Multidetector CT imaging of the abdomen and pelvis was performed using the standard protocol following bolus administration of intravenous contrast. RADIATION DOSE REDUCTION: This exam was performed according to the departmental dose-optimization program which includes automated exposure control, adjustment of the mA and/or kV according to patient size and/or use of iterative reconstruction technique. CONTRAST:  137m OMNIPAQUE IOHEXOL 300 MG/ML  SOLN COMPARISON:  02/23/2021 FINDINGS: Lower chest: Small pericardial effusion, similar to prior study. Diffuse coronary artery calcifications and scattered aortic calcifications. No acute abnormality. Hepatobiliary: No focal hepatic abnormality. Gallbladder unremarkable. Pancreas: No focal abnormality or ductal dilatation. Spleen: No focal abnormality.  Normal size. Adrenals/Urinary Tract: No suspicious renal or adrenal mass. No stones or hydronephrosis. Urinary bladder unremarkable. Stomach/Bowel: Scattered colonic diverticula. No active diverticulitis. Moderate stool burden. Stomach and small bowel unremarkable. No obstruction. Vascular/Lymphatic: Aortic atherosclerosis. No evidence of aneurysm or adenopathy. Reproductive: No visible focal abnormality. Other: No free fluid or free air. Musculoskeletal: Postoperative changes from posterior fusion in the lower  lumbar spine. No acute bony abnormality. IMPRESSION: No acute findings in the abdomen or pelvis. Moderate stool burden. Scattered colonic diverticulosis. Electronically Signed   By: Rolm Baptise M.D.   On: 12/12/2021 22:04   DG Chest Port 1 View  Result Date: 12/12/2021 CLINICAL DATA:  Fever with nausea and vomiting. EXAM: PORTABLE CHEST 1 VIEW COMPARISON:  March 01, 2021 FINDINGS: Multiple radiopaque cardiac lead wires are seen overlying the left lung. The heart size and mediastinal contours are within normal limits.  A radiopaque loop recorder device is seen overlying the mid left lung. Both lungs are clear. Postoperative changes are seen within the lower cervical spine. The visualized skeletal structures are unremarkable. IMPRESSION: No active cardiopulmonary disease. Electronically Signed   By: Virgina Norfolk M.D.   On: 12/12/2021 21:00   DG Lumbar Spine 2-3 Views  Result Date: 12/08/2021 CLINICAL DATA:  Fluoroscopic assistance for lumbar spine surgery EXAM: LUMBAR SPINE - 2-3 VIEW COMPARISON:  MR lumbar spine done on 07/16/2021 FINDINGS: Fluoroscopic images show posterior surgical fusion at L4-L5 level. Intervertebral disc spacer is noted in place. Fluoroscopic time was 1 minute and 35 seconds. Radiation dose 58.9 mGy. IMPRESSION: Fluoroscopic assistance was provided for lumbar fusion at L4-L5 level. Electronically Signed   By: Elmer Picker M.D.   On: 12/08/2021 12:55   DG C-Arm 1-60 Min-No Report  Result Date: 12/08/2021 Fluoroscopy was utilized by the requesting physician.  No radiographic interpretation.   DG C-Arm 1-60 Min-No Report  Result Date: 12/08/2021 Fluoroscopy was utilized by the requesting physician.  No radiographic interpretation.   DG C-Arm 1-60 Min-No Report  Result Date: 12/08/2021 Fluoroscopy was utilized by the requesting physician.  No radiographic interpretation.    Microbiology: Results for orders placed or performed during the hospital encounter of 12/12/21  Blood Culture (routine x 2)     Status: None (Preliminary result)   Collection Time: 12/12/21  8:30 PM   Specimen: BLOOD  Result Value Ref Range Status   Specimen Description   Final    BLOOD BLOOD RIGHT WRIST Performed at [facility] [address], [place], [place] [address]    Special Requests   Final    BOTTLES DRAWN AEROBIC AND ANAEROBIC Blood Culture results may not be optimal due to an inadequate volume of blood received in culture bottles Performed at [facility] [address] [place], [place] [address]    Culture   Final    NO GROWTH 2 DAYS Performed at [facility] [address] [place], [place] [address]    Report Status PENDING  Incomplete  Blood Culture (routine x 2)     Status: None (Preliminary result)   Collection Time: 12/12/21  8:34 PM   Specimen: BLOOD  Result Value Ref Range Status   Specimen Description   Final    BLOOD LEFT ANTECUBITAL Performed at [facility] [address] [place], [place] [address]    Special Requests   Final    BOTTLES DRAWN AEROBIC AND ANAEROBIC Blood Culture results may not be optimal due to an inadequate volume of blood received in culture bottles Performed at [facility] [address] [place], [place] [address]    Culture   Final    NO GROWTH 2 DAYS Performed at [facility] [address] [place], [place] [address]    Report Status PENDING  Incomplete  Urine Culture     Status: None  Collection Time: 12/12/21  8:39 PM   Specimen: In/Out Cath Urine  Result Value Ref Range Status   Specimen Description   Final    IN/OUT CATH URINE Performed at [facility], [place] [address], [place], [place] [address]    Special Requests   Final    NONE Performed at [facility], [place] [address] [place], [place] [address]    Culture   Final    NO GROWTH Performed at [facility], [place] [address], [place], [place] [address]    Report Status 12/14/2021 FINAL  Final  Resp Panel by RT-PCR (Flu A&B, Covid) Anterior Nasal Swab     Status: None   Collection Time: 12/12/21 10:00 PM   Specimen: Anterior Nasal Swab  Result Value Ref Range Status   SARS Coronavirus 2 by RT PCR NEGATIVE NEGATIVE Final    Comment: (NOTE) SARS-CoV-2 target nucleic acids are NOT DETECTED.  The SARS-CoV-2 RNA is generally detectable in upper respiratory specimens during the acute phase of infection. The  lowest concentration of SARS-CoV-2 viral copies this assay can detect is 138 copies/mL. A negative result does not preclude SARS-Cov-2 infection and should not be used as the sole basis for treatment or other patient management decisions. A negative result may occur with  improper specimen collection/handling, submission of specimen other than nasopharyngeal swab, presence of viral mutation(s) within the areas targeted by this assay, and inadequate number of viral copies(<138 copies/mL). A negative result must be combined with clinical observations, patient history, and epidemiological information. The expected result is Negative.  Fact Sheet for Patients:  EntrepreneurPulse.com.au  Fact Sheet for Healthcare Providers:  IncredibleEmployment.be  This test is no t yet approved or cleared by the [place] FDA and  has been authorized for detection and/or diagnosis of SARS-CoV-2 by FDA under an Emergency Use Authorization (EUA). This EUA will remain  in effect (meaning this test can be used) for the duration of the COVID-19 declaration under Section 564(b)(1) of the Act, 21 U.S.C.section 360bbb-3(b)(1), unless the authorization is terminated  or revoked sooner.       Influenza A by PCR NEGATIVE NEGATIVE Final   Influenza B by PCR NEGATIVE NEGATIVE Final    Comment: (NOTE) The Xpert Xpress SARS-CoV-2/FLU/RSV plus assay is intended as an aid in the diagnosis of influenza from Nasopharyngeal swab specimens and should not be used as a sole basis for treatment. Nasal washings and aspirates are unacceptable for Xpert Xpress SARS-CoV-2/FLU/RSV testing.  Fact Sheet for Patients: EntrepreneurPulse.com.au  Fact Sheet for Healthcare Providers: IncredibleEmployment.be  This test is not yet approved or cleared by the [place] FDA and has been authorized for detection and/or diagnosis of SARS-CoV-2 by FDA under  an Emergency Use Authorization (EUA). This EUA will remain in effect (meaning this test can be used) for the duration of the COVID-19 declaration under Section 564(b)(1) of the Act, 21 U.S.C. section 360bbb-3(b)(1), unless the authorization is terminated or revoked.  Performed at [facility], [place] [address], [place], [place] [address]     Labs: CBC: Recent Labs  Lab 12/12/21 2034 12/14/21 0339  WBC 4.2 4.2  NEUTROABS 3.6  --   HGB 10.2* 9.4*  HCT 29.9* 28.3*  MCV 92.3 93.1  PLT 164 846   Basic Metabolic Panel: Recent Labs  Lab 12/12/21 2034 12/14/21 0339  NA 141 139  K 3.6 3.3*  CL 101 98  CO2 29 30  GLUCOSE 182* 125*  BUN 18  18  CREATININE 1.01 0.89  CALCIUM 9.2 8.9   Liver Function Tests: Recent Labs  Lab 12/12/21 2034  AST 18  ALT 14  ALKPHOS 71  BILITOT 1.5*  PROT 7.0  ALBUMIN 3.4*   CBG: Recent Labs  Lab 12/09/21 0609 12/13/21 0327 12/13/21 1635 12/14/21 0831 12/14/21 1140  GLUCAP 156* 153* 124* 112* 113*    Discharge time spent: greater than 30 minutes.  Signed: Patrecia Pour, MD [facility] 12/14/2021

## 2021-12-17 LAB — CULTURE, BLOOD (ROUTINE X 2)
Culture: NO GROWTH
Culture: NO GROWTH

## 2021-12-28 DIAGNOSIS — E119 Type 2 diabetes mellitus without complications: Secondary | ICD-10-CM | POA: Diagnosis not present

## 2022-01-09 DIAGNOSIS — H524 Presbyopia: Secondary | ICD-10-CM | POA: Diagnosis not present

## 2022-01-10 DIAGNOSIS — Z0100 Encounter for examination of eyes and vision without abnormal findings: Secondary | ICD-10-CM | POA: Diagnosis not present

## 2022-01-24 DIAGNOSIS — D649 Anemia, unspecified: Secondary | ICD-10-CM | POA: Diagnosis not present

## 2022-01-24 DIAGNOSIS — Z8679 Personal history of other diseases of the circulatory system: Secondary | ICD-10-CM | POA: Diagnosis not present

## 2022-01-24 DIAGNOSIS — Z955 Presence of coronary angioplasty implant and graft: Secondary | ICD-10-CM | POA: Diagnosis not present

## 2022-01-24 DIAGNOSIS — Z87891 Personal history of nicotine dependence: Secondary | ICD-10-CM | POA: Diagnosis not present

## 2022-01-24 DIAGNOSIS — I252 Old myocardial infarction: Secondary | ICD-10-CM | POA: Diagnosis not present

## 2022-01-24 DIAGNOSIS — Z9889 Other specified postprocedural states: Secondary | ICD-10-CM | POA: Diagnosis not present

## 2022-01-24 DIAGNOSIS — R55 Syncope and collapse: Secondary | ICD-10-CM | POA: Diagnosis not present

## 2022-01-24 DIAGNOSIS — Z79899 Other long term (current) drug therapy: Secondary | ICD-10-CM | POA: Diagnosis not present

## 2022-01-24 DIAGNOSIS — E119 Type 2 diabetes mellitus without complications: Secondary | ICD-10-CM | POA: Diagnosis not present

## 2022-01-24 DIAGNOSIS — E785 Hyperlipidemia, unspecified: Secondary | ICD-10-CM | POA: Diagnosis not present

## 2022-01-24 DIAGNOSIS — I2510 Atherosclerotic heart disease of native coronary artery without angina pectoris: Secondary | ICD-10-CM | POA: Diagnosis not present

## 2022-02-02 DIAGNOSIS — D509 Iron deficiency anemia, unspecified: Secondary | ICD-10-CM | POA: Diagnosis not present

## 2022-02-16 DIAGNOSIS — W19XXXA Unspecified fall, initial encounter: Secondary | ICD-10-CM | POA: Diagnosis not present

## 2022-02-16 DIAGNOSIS — R2681 Unsteadiness on feet: Secondary | ICD-10-CM | POA: Diagnosis not present

## 2022-02-16 DIAGNOSIS — D509 Iron deficiency anemia, unspecified: Secondary | ICD-10-CM | POA: Diagnosis not present

## 2022-02-16 DIAGNOSIS — R4189 Other symptoms and signs involving cognitive functions and awareness: Secondary | ICD-10-CM | POA: Diagnosis not present

## 2022-02-23 ENCOUNTER — Other Ambulatory Visit: Payer: Self-pay

## 2022-02-23 DIAGNOSIS — D509 Iron deficiency anemia, unspecified: Secondary | ICD-10-CM | POA: Insufficient documentation

## 2022-02-23 DIAGNOSIS — D508 Other iron deficiency anemias: Secondary | ICD-10-CM

## 2022-03-05 IMAGING — MR MR LUMBAR SPINE W/O CM
4 of 5 series · 30 of 48 positions shown · non-contrast
Comparison: MR lumbar 04/16/2013.

CLINICAL DATA: Radiculopathy; unspecified spinal region.

EXAM:
MRI LUMBAR SPINE WITHOUT CONTRAST
TECHNIQUE: Multiplanar, multisequence MR imaging of the lumbar spine was
performed. No intravenous contrast was administered.

[Series 5: T1 · sagittal · 4.0mm · 0.81mm/px · 6 of 17 slices shown (1 of 2)]
[im 1/17]
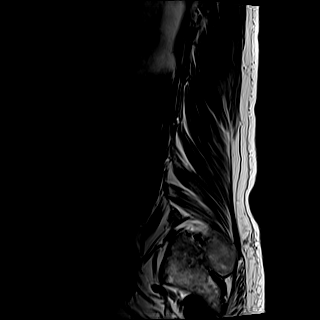
[im 4/17]
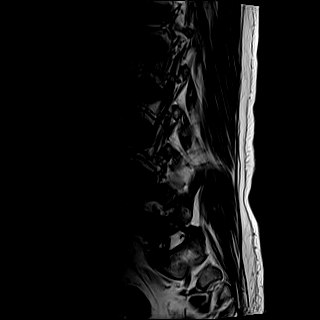
[im 7/17]
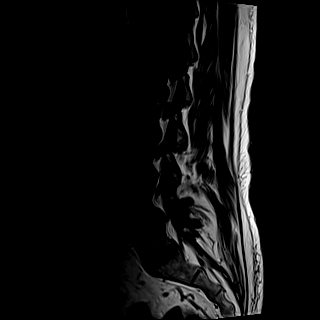
[im 10/17]
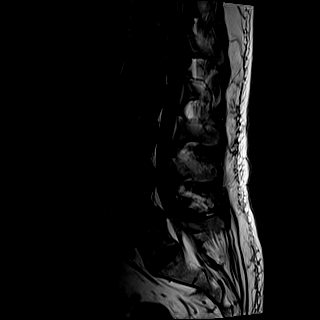
[im 13/17]
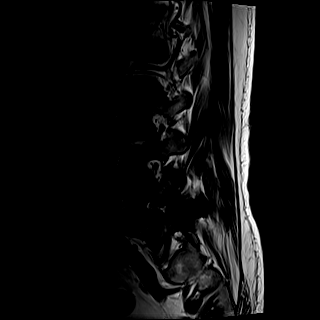
[im 17/17]
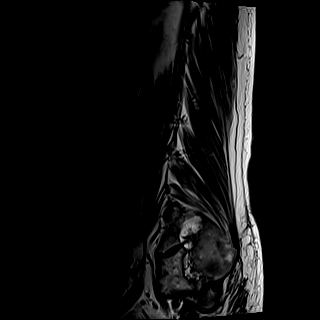

[Series 6: T2 · sagittal · 4.0mm · 0.81mm/px · 6 of 17 slices shown (1 of 2)]
[im 1/17]
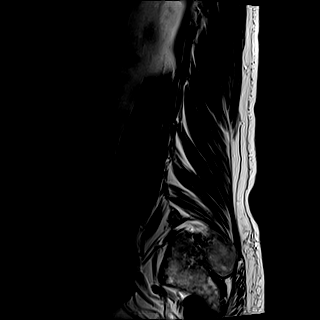
[im 4/17]
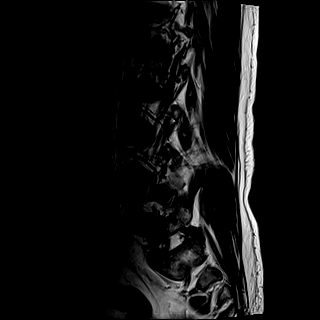
[im 7/17]
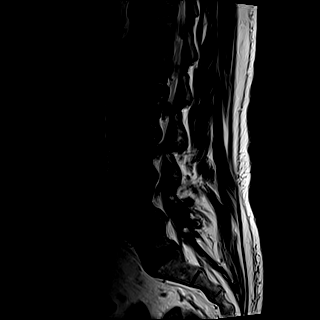
[im 10/17]
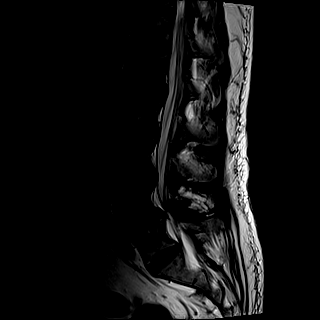
[im 13/17]
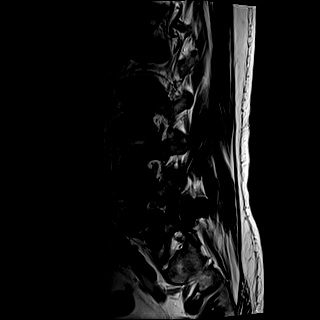
[im 17/17]
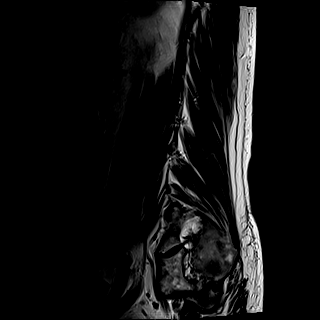

[Series 8: T2 · axial · 4.0mm · 0.62mm/px · z∈[-53,+175]mm · 9 of 44 slices shown (2 of 2)]
[im 1/44]
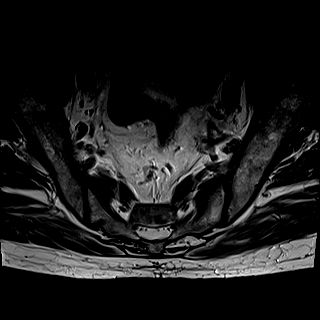
[im 7/44]
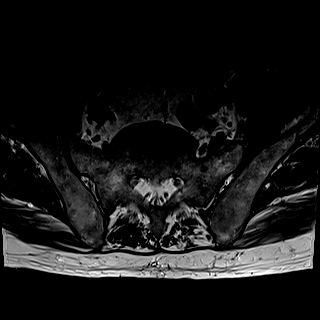
[im 13/44]
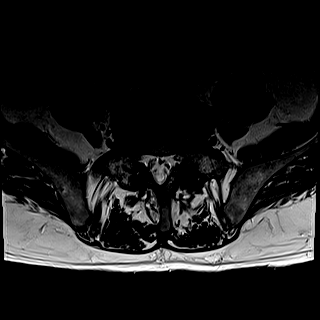
[im 19/44]
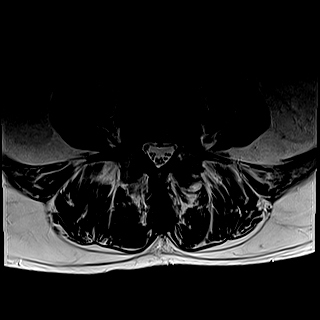
[im 22/44]
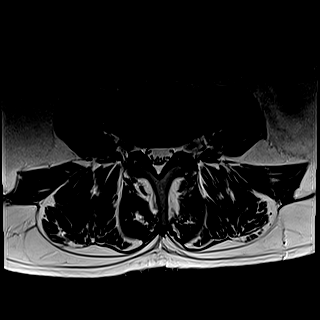
[im 25/44]
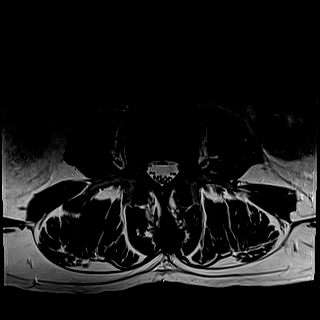
[im 31/44]
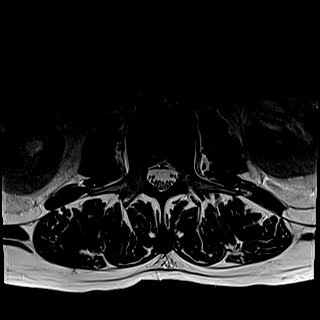
[im 37/44]
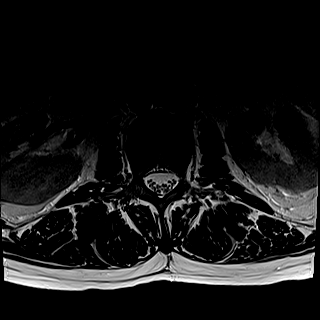
[im 44/44]
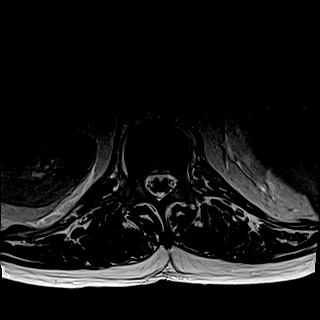

[Series 9: T1 · axial · 4.0mm · 0.39mm/px · z∈[-53,+175]mm · 9 of 44 slices shown (2 of 2)]
[im 1/44]
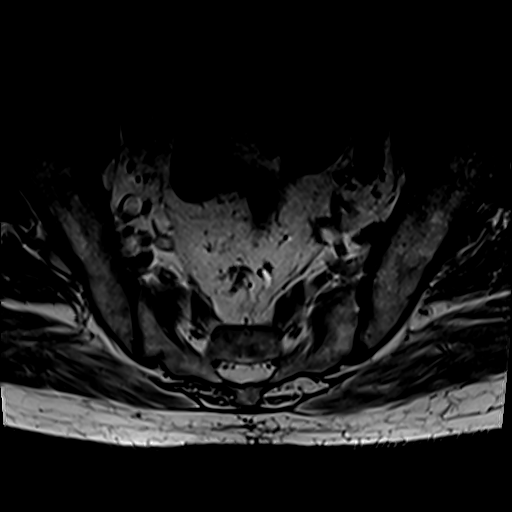
[im 7/44]
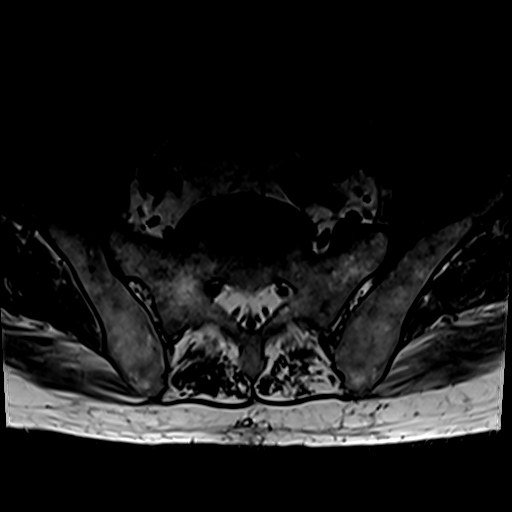
[im 13/44]
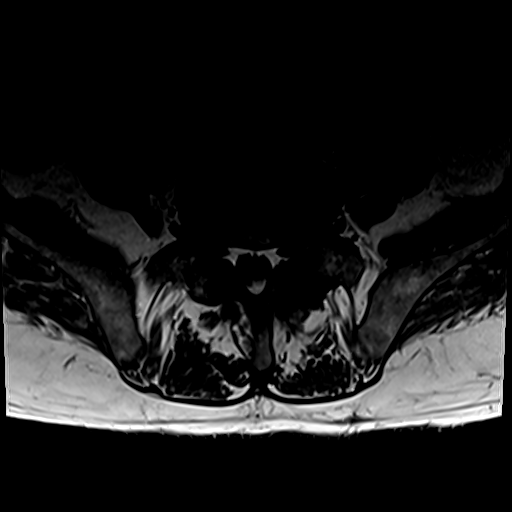
[im 19/44]
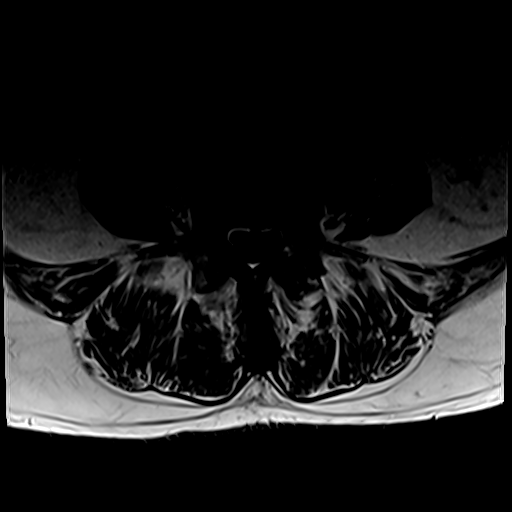
[im 22/44]
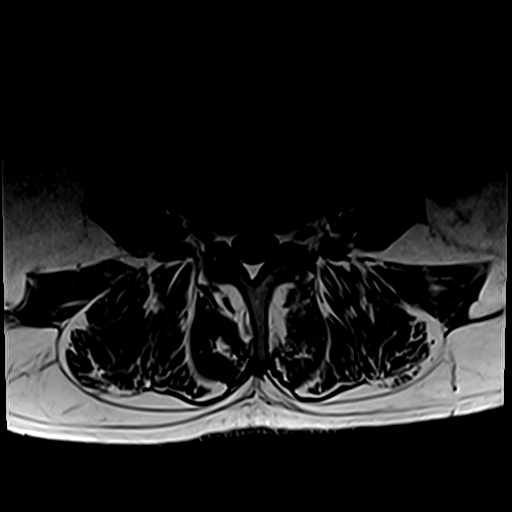
[im 25/44]
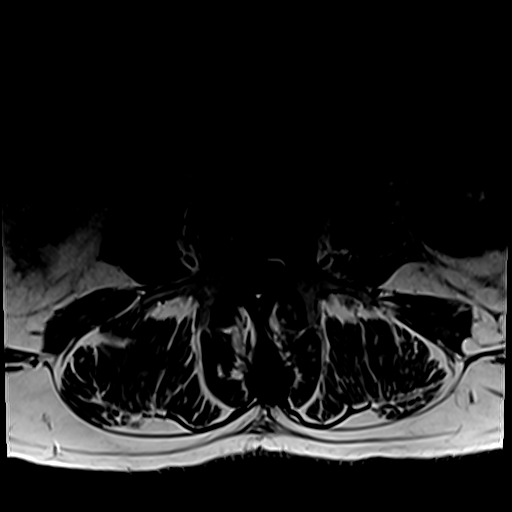
[im 31/44]
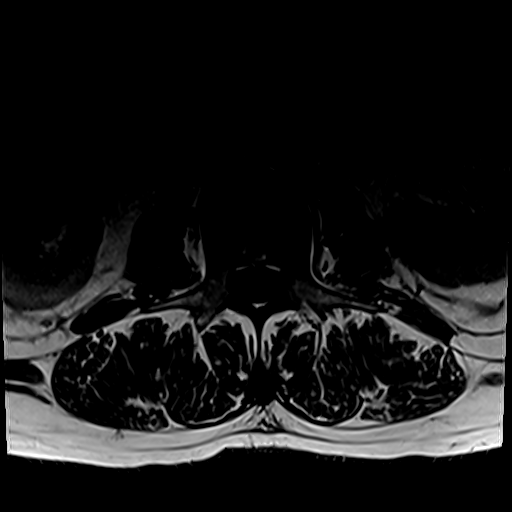
[im 37/44]
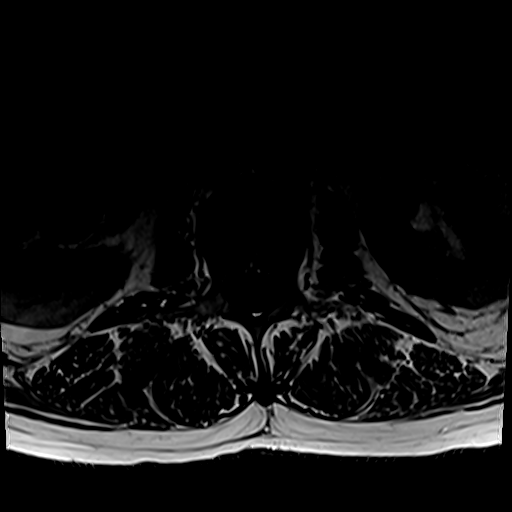
[im 44/44]
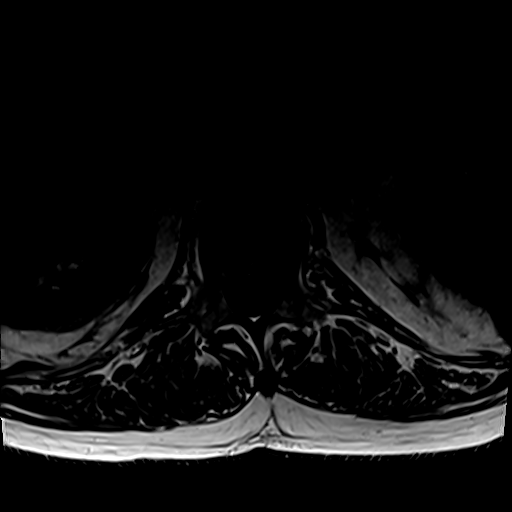

[30 of 48 positions shown; findings below may reference images not displayed]

FINDINGS: Segmentation: Transitional lumbosacral vertebrae with lumbarized S1,
numbered accordingly. Independent review of these levels should be
performed prior to any planned intervention.

Alignment:  Trace anterolisthesis at L4-L5.

Vertebrae: No fracture, evidence of discitis, or aggressive bone
lesion. There is a calcified synovial cyst posterior to the left
L5-S1 facet, not in position to cause any impingement.

Conus medullaris and cauda equina: Conus extends to the L2 level.
Conus and cauda equina appear normal.

Paraspinal and other soft tissues: There is a 1.0 cm simple
appearing left renal cyst.

Disc levels:

T12-L1: No significant spinal canal or neural foraminal narrowing.

L1-L2: No significant spinal canal or neural foraminal narrowing.

L2-L3: Minimal disc bulging. Ligamentum flavum hypertrophy and mild
facet arthropathy. No significant spinal canal or neural foraminal
stenosis.

L3-L4: Mild disc bulging, ligamentum flavum hypertrophy and right
greater than left facet arthropathy. There is mild spinal canal
narrowing. No significant neural foraminal narrowing.

L4-L5: Minimal disc bulging, ligamentum flavum hypertrophy and
bilateral facet arthropathy. Mild spinal canal narrowing. No
significant neural foraminal narrowing.

L5-S1: Grade 1 anterolisthesis with broad-based disc bulging,
ligamentum flavum hypertrophy and severe bilateral facet
arthropathy. Moderate to severe spinal canal stenosis. Mild to
moderate left-sided neural foraminal narrowing. No right-sided
neural foraminal narrowing.
IMPRESSION: Multilevel degenerative changes of the lumbar spine with
transitional lumbosacral vertebrae. Independent review of these
levels should be performed prior to any planned intervention.

Moderate to severe spinal canal stenosis and mild to moderate
left-sided neural foraminal narrowing at L5-S1 due to grade 1
anterolisthesis with broad-based disc bulging and severe bilateral
facet degeneration.

Mild spinal canal narrowing at L3-L4 and L4-L5.

## 2022-03-07 DIAGNOSIS — Z959 Presence of cardiac and vascular implant and graft, unspecified: Secondary | ICD-10-CM | POA: Diagnosis not present

## 2022-03-13 ENCOUNTER — Telehealth: Payer: Self-pay | Admitting: Pharmacy Technician

## 2022-03-13 ENCOUNTER — Other Ambulatory Visit: Payer: Self-pay

## 2022-03-13 DIAGNOSIS — Z959 Presence of cardiac and vascular implant and graft, unspecified: Secondary | ICD-10-CM | POA: Diagnosis not present

## 2022-03-13 DIAGNOSIS — D509 Iron deficiency anemia, unspecified: Secondary | ICD-10-CM | POA: Insufficient documentation

## 2022-03-13 NOTE — Telephone Encounter (Signed)
Feraheme is non preferred medication and will like be denied. Preferred medication is venofer.  I have contacted Tera from [facility] Awaiting response. 2nd request. Phone: [phone] Fax: [phone]

## 2022-03-14 ENCOUNTER — Encounter: Payer: Self-pay | Admitting: Pulmonary Disease

## 2022-03-27 DIAGNOSIS — D509 Iron deficiency anemia, unspecified: Secondary | ICD-10-CM | POA: Diagnosis not present

## 2022-03-27 DIAGNOSIS — Z23 Encounter for immunization: Secondary | ICD-10-CM | POA: Diagnosis not present

## 2022-03-27 DIAGNOSIS — G2581 Restless legs syndrome: Secondary | ICD-10-CM | POA: Diagnosis not present

## 2022-03-29 ENCOUNTER — Other Ambulatory Visit: Payer: Self-pay | Admitting: Gastroenterology

## 2022-03-29 ENCOUNTER — Encounter: Payer: Self-pay | Admitting: Gastroenterology

## 2022-03-29 ENCOUNTER — Ambulatory Visit (INDEPENDENT_AMBULATORY_CARE_PROVIDER_SITE_OTHER): Payer: Medicare HMO | Admitting: Gastroenterology

## 2022-03-29 VITALS — BP 118/66 | HR 72 | Ht 65.0 in | Wt 165.3 lb

## 2022-03-29 DIAGNOSIS — I4811 Longstanding persistent atrial fibrillation: Secondary | ICD-10-CM

## 2022-03-29 DIAGNOSIS — R55 Syncope and collapse: Secondary | ICD-10-CM

## 2022-03-29 DIAGNOSIS — K7581 Nonalcoholic steatohepatitis (NASH): Secondary | ICD-10-CM

## 2022-03-29 DIAGNOSIS — I2510 Atherosclerotic heart disease of native coronary artery without angina pectoris: Secondary | ICD-10-CM

## 2022-03-29 DIAGNOSIS — K7460 Unspecified cirrhosis of liver: Secondary | ICD-10-CM

## 2022-03-29 DIAGNOSIS — D509 Iron deficiency anemia, unspecified: Secondary | ICD-10-CM | POA: Diagnosis not present

## 2022-03-29 MED ORDER — NA SULFATE-K SULFATE-MG SULF 17.5-3.13-1.6 GM/177ML PO SOLN: 1.0000 | Freq: Once | ORAL | 0 refills | Status: AC

## 2022-03-29 NOTE — Patient Instructions (Signed)
_______________________________________________________  If you are age 69 or older, your body mass index should be between 23-30. Your Body mass index is 27.51 kg/m. If this is out of the aforementioned range listed, please consider follow up with your Primary Care Provider.  If you are age 43 or younger, your body mass index should be between 19-25. Your Body mass index is 27.51 kg/m. If this is out of the aformentioned range listed, please consider follow up with your Primary Care Provider.   You have been scheduled for an endoscopy and colonoscopy. Please follow the written instructions given to you at your visit today. Please pick up your prep supplies at the pharmacy within the next 1-3 days. If you use inhalers (even only as needed), please bring them with you on the day of your procedure.   The [facility] providers would like to encourage you to use [facility] to communicate with providers for non-urgent requests or questions.  Due to long hold times on the telephone, sending your provider a message by [facility] may be a faster and more efficient way to get a response.  Please allow 48 business hours for a response.  Please remember that this is for non-urgent requests.   It was a pleasure to see you today!  Thank you for trusting me with your gastrointestinal care!    Scott E.Candis Schatz, MD

## 2022-03-29 NOTE — Progress Notes (Signed)
HPI : Kristopher [name] is a very pleasant 71 year old male with a history of CAD s/p stent, a-fib and compensated NASH cirrhosis who is referred to [place] by Dr. Walker Shadow for further evaluation of iron deficiency anemia.  Patient denies any symptoms of overt GI bleeding.  He denies any other chronic GI symptoms such as abdominal pain, constipation or diarrhea.  He has been chronic nausea and vomiting.  This is been a problem for him for several years.  The symptoms are characterized by discrete episodes of nausea vomiting without clear precipitants.  These episodes occur frequently, multiple days a week.  He does not have abdominal pain with these episodes.  He is followed by [facility] for his NASH cirrhosis.  He has no history of decompensations.  He underwent an EGD in 2020 to evaluate the nausea and vomiting which was notable for chronic gastritis, no H. pylori.  No varices were seen.    He had a colonoscopy by Dr. Collene Mares about 10 years ago which he says was normal.  He has a chronic normocytic anemia for several years with hgb typically 10-11.  An iron panel was recently checked and was consistent with iron deficiency (ferritin 16, iron sat 9%).  He was started on oral iron supplements.  I could not find a previous iron panel for comparison.  He has a history of coronary artery disease with stenting as well as atrial fibrillation.  He is on DAPT.  He denies have any symptoms of palpitations or chest pain.  He does have recurrent episodes of lightheadedness and recurrent syncopal events.  The syncopal events have been recurring for many years, and typically happen a few times a year.  The last time was about 2 nights ago.  He has a loop recorder in place, and there does not appear to be a cardiac etiology to his syncopal events.  He is followed by [facility] cardiology and saw them in August with plans to follow up in 6 months.  He underwent L5-LS1 minimally invasive laminectomy and transforaminal lumbar  interbody fusion on 5/36/46 that was complicated by shortness of breath and he was admitted for acute hypoxic respiratory failure on 11/22/21-12/14/21. Per notes, chest x-ray was negative for pneumonia. D-dimer was 0.99. CTA chest was negative for PE or pneumonia. CT abdomen/pelvis was negative for acute process. CT lumbar spine showed only postop changes and no abscess or other abnormality. He was empirically treated with antibiotics and given furosemide. He responded and was discharged shortly thereafter.  On 02/23/21-03/04/21, he was admitted urosepsis due to ESBL Klebsiella and Enterococcus in the setting of an indwelling foley catheter, fever, hematuria. Hospital course complicated by A. fib with RVR, acute heart failure exacerbation, ARDS with acute hypoxic respiratory failure and acute metabolic encephalopathy. He was treated with IV meropenum x 7 days. He also developed superficial cephalic vein thrombosis in the right upper forearm. HS Trop I were elevated to 38 and 39 on 9/7.  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   Taken from most recent [facility] note:  "Patient has had a extensive serologic work-up for elevated liver enzyme tests in the past including ASMA, IgG, A1AT and HFE testing after an elevated transferrin saturation (74%). Fibroscan showed stiffness 16.6 kPa from (07/2015).  Fibroscan showed stiffness 16.6 kPa from (07/2015). His last [place] surveillance was 09/2017 and was negative. His last EV surveillance was 01/2016 and  was notable for PHG without EV. Variceal surveillance: Negative EGD (01/2016) CRC screening: Last performed in 2014 in [place], [place] [facility] surveillance: Negative ultrasound 10/16/17 Hepatitis A serology: Not immune Hepatitis B serology: Not immune Influenza: Recommended this occur late into each fall Pneumonia: Prevnar followed by pneumovax after 8 weeks"  Past Medical History:  Diagnosis Date    Anemia 12/2020   Anginal pain ([place])    Atrial fibrillation ([place])    a. s/p PVI X2 by Dr Omelia Blackwater at [facility] around 2000.   CAD (coronary artery disease)    a. NSTEMI (06/2013):  LHC (06/27/2013):  pLAD 10-20, RCA 90, EF 45-50%, inf HK.  PCI:  Xience Alpine (3 x 23 mm) DES to the RCA. b. Relook cath 07/10/13 - patent stent. Imdur added. c. Recurrent CP after that - abnormal nuc 07/2017 with chest pain, dyspnea, hypotensive response. Rest images with mid-basal inferior thinning. No stress images due to abrupt d/c of test. Relook cath 3/12 - no significant r   Chronic back pain greater than 3 months duration    Cirrhosis, non-alcoholic (HCC)    Depression    DJD (degenerative joint disease) of cervical spine    "3 herniated discs" (09/01/2013)   DJD (degenerative joint disease), lumbosacral    "bulging L5-S1" (09/01/2013)   Dysrhythmia    PVCs. A.fib with two ablations   Herniated cervical disc    c2-T1 fusion   History of kidney stones    turned into urosepsis   HLD (hyperlipidemia)    Ischemic cardiomyopathy    a. Echo (06/28/2011): Inferior and inferoseptal akinesis, EF 16%, grade 2 diastolic dysfunction, MAC, small effusion (no tamponade). B. EF 40% by echo 06/2013, cath 07/2013, 55% by cath 08/2013 with subtle mild mid inferior hypocontractility.   Liver cirrhosis secondary to NASH [facility])    Neuromuscular disorder ([place])    bilateral neuropathy legs   NSTEMI (non-ST elevated myocardial infarction) ([place]) 06/27/2013   Osteoarthritis    "neck and lower back" (09/01/2013)   Pericardial effusion    a. Chronic since hx of pericarditis in 2004. b. Mod by echo 06/2013, small by echo 06/2013.   Pneumonia ~ 2005   Type II diabetes mellitus ([place])    Vaso vagal episode    wears continuous loop recorder   EGD Nov 2020 Findings: The esophagus was normal. Patchy mildly erythematous mucosa without bleeding was  found in the gastric antrum. Biopsies were taken with a  cold forceps for Helicobacter pylori  testing. The examined duodenum was normal.  Biopsies with chronic gastritis, no H. pylori  Past Surgical History:  Procedure Laterality Date   APPENDECTOMY  ~ [place]  06/19/1998   Dr Omelia Blackwater at [facility]     2009   CORONARY ANGIOPLASTY     CYSTOSCOPY WITH RETROGRADE URETHROGRAM N/A 07/21/2021   Procedure: CYSTOSCOPY WITH URETHRAL DILATION;  Surgeon: [name], MD;  Location: [facility];  Service: Urology;  Laterality: N/A;  45 MINS   LEFT AND RIGHT HEART CATHETERIZATION WITH CORONARY ANGIOGRAM N/A 10/07/2013   Procedure: LEFT AND RIGHT HEART CATHETERIZATION WITH CORONARY ANGIOGRAM;  Surgeon: Troy Sine, MD;  Location: [facility] CATH LAB;  Service: Cardiovascular;  Laterality: N/A;   LEFT HEART CATHETERIZATION WITH CORONARY ANGIOGRAM N/A 06/27/2013   Procedure: LEFT HEART CATHETERIZATION WITH CORONARY ANGIOGRAM;  Surgeon: Troy Sine, MD;  Location: [facility] CATH LAB;  Service:  Cardiovascular;  Laterality: N/A;   LEFT HEART CATHETERIZATION WITH CORONARY ANGIOGRAM N/A 07/10/2013   Procedure: LEFT HEART CATHETERIZATION WITH CORONARY ANGIOGRAM;  Surgeon: Burnell Blanks, MD;  Location: [facility] CATH LAB;  Service: Cardiovascular;  Laterality: N/A;   LEFT HEART CATHETERIZATION WITH CORONARY ANGIOGRAM N/A 08/28/2013   Procedure: LEFT HEART CATHETERIZATION WITH CORONARY ANGIOGRAM;  Surgeon: Troy Sine, MD;  Location: [facility] CATH LAB;  Service: Cardiovascular;  Laterality: N/A;   NEPHROLITHOTOMY Left 02/11/2021   Procedure: LEFT NEPHROLITHOTOMY PERCUTANEOUS WITH SURGEON ACCESS;  Surgeon: [name], MD;  Location: [facility];  Service: Urology;  Laterality: Left;   PERCUTANEOUS CORONARY STENT INTERVENTION (PCI-S)  06/27/2013   Procedure: PERCUTANEOUS CORONARY STENT INTERVENTION (PCI-S);  Surgeon: Troy Sine, MD;  Location: [facility] CATH LAB;  Service: Cardiovascular;;   POSTERIOR CERVICAL  FUSION/FORAMINOTOMY  06/20/2007   fusion c3-t1   PULMONARY VEIN ABLATION  06/20/1999   Dr Omelia Blackwater at [place]  06/19/1955   TRANSFORAMINAL LUMBAR INTERBODY FUSION W/ MIS 1 LEVEL Left 12/08/2021   Procedure: LEFT LUMBAR FIVE-SACRAL ONE MINIMALLY INVASIVE (MIS) TRANSFORAMINAL LUMBAR INTERBODY FUSION;  Surgeon: Judith Part, MD;  Location: [place];  Service: Neurosurgery;  Laterality: Left;   Family History  Problem Relation Age of Onset   CAD Father    Sudden death Maternal Grandfather    COPD Other    Cancer Other    Diabetes Other    Heart disease Other    Arrhythmia Neg Hx    Social History   Tobacco Use   Smoking status: Former    Packs/day: 1.50    Years: 43.00    Total pack years: 64.50    Types: Cigarettes    Quit date: 05/06/2007    Years since quitting: 14.9   Smokeless tobacco: Never  Vaping Use   Vaping Use: Never used  Substance Use Topics   Alcohol use: Yes    Comment: once monthly/rarely   Drug use: No   Current Outpatient Medications  Medication Sig Dispense Refill   aspirin EC 81 MG tablet Take 81 mg by mouth every evening.      baclofen (LIORESAL) 10 MG tablet Take 10 mg by mouth 3 (three) times daily as needed for muscle spasms.     cetirizine (ZYRTEC) 10 MG tablet Take 10 mg by mouth daily as needed for allergies.     clopidogrel (PLAVIX) 75 MG tablet Take 1 tablet (75 mg total) by mouth daily. Restart on 12/13/21     diltiazem (CARDIZEM SR) 120 MG 12 hr capsule Take 120 mg by mouth every 36 (thirty-six) hours.     escitalopram (LEXAPRO) 20 MG tablet Take 20 mg by mouth daily.     gabapentin (NEURONTIN) 600 MG tablet Take 600 mg by mouth 3 (three) times daily.     ibuprofen (ADVIL) 200 MG tablet Take 800 mg by mouth daily as needed for mild pain.     metFORMIN (GLUCOPHAGE-XR) 500 MG 24 hr tablet Take 1,000 mg by mouth 2 (two) times daily.     Misc Natural Products (PUMPKIN SEED OIL) CAPS Take 1 capsule by mouth daily. 3000 mg      Multiple Vitamins-Iron (MULTIVITAMINS WITH IRON) TABS tablet Take 1 tablet by mouth daily. 30 tablet 0   niacin 500 MG tablet Take 500 mg by mouth at bedtime.     pantoprazole (PROTONIX) 40 MG tablet Take 40 mg by mouth daily.     pioglitazone (ACTOS) 15 MG tablet Take  15 mg by mouth daily.     promethazine (PHENERGAN) 25 MG tablet Take 25 mg by mouth every 6 (six) hours as needed for nausea/vomiting.     rosuvastatin (CRESTOR) 5 MG tablet Take 5 mg by mouth every evening.     Saw Palmetto 450 MG CAPS Take 450 mg by mouth at bedtime.     tamsulosin (FLOMAX) 0.4 MG CAPS capsule Take 0.4 mg by mouth at bedtime.     ferrous sulfate 325 (65 FE) MG tablet Take 325 mg by mouth 2 (two) times daily.     rOPINIRole (REQUIP) 2 MG tablet Take 2 mg by mouth at bedtime.     No current facility-administered medications for this visit.   Allergies  Allergen Reactions   Codeine Nausea And Vomiting   Duragesic-100 [Fentanyl] Nausea And Vomiting   Naprosyn [Naproxen] Nausea Only   Other Nausea And Vomiting    1. N/V to all opiates. He is able to take versed. (noted 09/29/13) 2.The patient states that he has had  a reaction to all narcotics. He can take them though. (noted 06/26/13)     Review of Systems: All systems reviewed and negative except where noted in HPI.    No results found.  Physical Exam: BP 118/66 (BP Location: Left Arm, Patient Position: Sitting, Cuff Size: Normal)   Pulse 72   Ht _0  (1.651 m) Comment: height measured without shoes  Wt 165 lb 5 oz (75 kg)   BMI 27.51 kg/m  Constitutional: Pleasant,well-developed, Caucasian male in no acute distress. HEENT: Normocephalic and atraumatic. Conjunctivae are normal. No scleral icterus. Neck supple.  Cardiovascular: Normal rate, regular rhythm.  Pulmonary/chest: Effort normal and breath sounds normal. No wheezing, rales or rhonchi. Abdominal: Soft, nondistended, nontender. Bowel sounds active throughout. There are no masses palpable.  No hepatomegaly. Extremities: no edema Neurological: Alert and oriented to person place and time. Skin: Skin is warm and dry. No rashes noted. Psychiatric: Normal mood and affect. Behavior is normal.  CBC    Component Value Date/Time   WBC 4.2 12/14/2021 0339   RBC 3.04 (L) 12/14/2021 0339   HGB 9.4 (L) 12/14/2021 0339   HCT 28.3 (L) 12/14/2021 0339   PLT 151 12/14/2021 0339   MCV 93.1 12/14/2021 0339   MCH 30.9 12/14/2021 0339   MCHC 33.2 12/14/2021 0339   RDW 14.4 12/14/2021 0339   LYMPHSABS 0.3 (L) 12/12/2021 2034   MONOABS 0.2 12/12/2021 2034   EOSABS 0.0 12/12/2021 2034   BASOSABS 0.0 12/12/2021 2034    CMP     Component Value Date/Time   NA 139 12/14/2021 0339   K 3.3 (L) 12/14/2021 0339   CL 98 12/14/2021 0339   CO2 30 12/14/2021 0339   GLUCOSE 125 (H) 12/14/2021 0339   BUN 18 12/14/2021 0339   CREATININE 0.89 12/14/2021 0339   CREATININE 0.90 10/02/2013 0851   CALCIUM 8.9 12/14/2021 0339   PROT 7.0 12/12/2021 2034   ALBUMIN 3.4 (L) 12/12/2021 2034   AST 18 12/12/2021 2034   ALT 14 12/12/2021 2034   ALKPHOS 71 12/12/2021 2034   BILITOT 1.5 (H) 12/12/2021 2034   GFRNONAA >60 12/14/2021 0339   GFRAA >60 09/19/2019 0302   WBC (White Blood Cell Count) 3.2 - 9.8 x10^9/L 4.2   Hemoglobin 13.7 - 17.3 g/dL 10.0 Low    Hematocrit 39.0 - 49.0 % 30.2 Low    Platelets 150 - 450 x10^9/L 144 Low    MCV (Mean Corpuscular Volume) 80 - 98  fL 92     Ref Range & Units 2 mo ago  Vitamin B12 123 - 730 pg/mL 177    Component Ref Range & Units 3 mo ago (12/14/21) 3 mo ago (12/12/21) 4 mo ago (11/30/21) 8 mo ago (07/14/21) 1 yr ago (03/04/21) 1 yr ago (03/03/21) 1 yr ago (03/02/21)  WBC 4.0 - 10.5 K/uL 4.2  4.2  7.1  5.2  10.9 High   9.7  8.6   RBC 4.22 - 5.81 MIL/uL 3.04 Low   3.24 Low   3.52 Low   3.30 Low   3.25 Low   3.27 Low   3.23 Low    Hemoglobin 13.0 - 17.0 g/dL 9.4 Low   10.2 Low   11.2 Low   11.0 Low   9.7 Low   9.7 Low   9.8 Low    HCT 39.0 - 52.0 % 28.3 Low    29.9 Low   32.5 Low   31.9 Low   28.9 Low   29.2 Low   28.2 Low    MCV 80.0 - 100.0 fL 93.1  92.3  92.3  96.7  88.9  89.3  87.3   MCH 26.0 - 34.0 pg 30.9  31.5  31.8  33.3  29.8  29.7  30.3   MCHC 30.0 - 36.0 g/dL 33.2  34.1  34.5  34.5  33.6  33.2  34.8   RDW 11.5 - 15.5 % 14.4  14.6  14.9  15.5  15.9 High   16.0 High   16.3 High    Platelets 150 - 400 K/uL 151  164  225  172       Component Ref Range & Units 1 yr ago (02/12/21) 1 yr ago (02/11/21) 1 yr ago (01/31/21) 2 yr ago (09/19/19) 2 yr ago (09/18/19) 5 yr ago (01/20/17) 6 yr ago (01/01/16)  WBC 4.0 - 10.5 K/uL 10.4  6.6  6.4  4.9  6.6  5.9  7.2   RBC 4.22 - 5.81 MIL/uL 3.27 Low   3.30 Low   4.19 Low   3.52 Low   3.54 Low   4.12 Low   4.19 Low    Hemoglobin 13.0 - 17.0 g/dL 10.2 Low   10.4 Low   12.9 Low   11.2 Low   11.3 Low   13.7  14.1   HCT 39.0 - 52.0 % 31.2 Low   31.9 Low   38.2 Low   33.9 Low   33.9 Low   37.6 Low   40.0   MCV 80.0 - 100.0 fL 95.4  96.7  91.2  96.3  95.8  91.3 R  95.5 R   MCH 26.0 - 34.0 pg 31.2  31.5  30.8  31.8  31.9  33.3  33.7   MCHC 30.0 - 36.0 g/dL 32.7  32.6  33.8  33.0  33.3  36.4 High   35.3   RDW 11.5 - 15.5 % 19.1 High   19.7 High   19.9 High   15.1  15.0  13.9  15.0   Platelets 150 - 400 K/uL 157  124 Low   174  154  165       Ref Range & Units 2 mo ago  Iron 50 - 160 g/dL 37 Low    Total Iron Binding Capacity (TIBC) 261 - 478 g/dL 420   Percent Transferrin Saturation 15 - 55 % 9 Low      Ref Range & Units 2 mo ago  Ferritin 11 - 204 ng/mL 16    ASSESSMENT AND PLAN: 71 year old male with history of coronary artery disease status post stenting, atrial fibrillation/flutter status post ablation with ILP referred for iron deficiency anemia.  The patient appears to have a longstanding moderate anemia with baseline hemoglobin stable around 10.  It is not clear whether his iron deficiency is new or chronic.  He has chronic nausea and vomiting episodes, but otherwise denies any chronic GI symptoms.  He  does not have any history of overt GI bleeding.  He is overdue for colon cancer screening.  On his last EGD and 2020 he had chronic gastritis, H. pylori negative.  There is also mention of portal hypertensive gastropathy in his history, but this was not mentioned on his procedure report.  Portal hypertensive gastropathy would be a good explanation for his iron deficiency anemia.  Nevertheless, given the new diagnosis of iron deficiency anemia, a repeat upper and lower endoscopy is warranted.  The patient has extensive heart history, but appears to be functioning fairly well.  He has recurrent vasovagal syncope episodes, which do not appear to be cardiac related.  The patient did tell me that his cardiologist office called him recently because of events on his loop recorder, but he does not sure of the details.  Because of this, I would like him to receive cardiology clearance prior to him proceeding with an EGD and colonoscopy.  We will also request a therapist set for him holding Plavix for 5 days prior to his procedures. The patient states that he plans to follow-up with [facility] hepatology regarding his Karlene Lineman cirrhosis.     Iron deficiency anemia - EGD, colonosopy -Cardiology clearance - Hold Plavix 5 days prior to procedures  NASH cirrhosis - Patient to follow-up with [facility] hepatology  The details, risks (including bleeding, perforation, infection, missed lesions, medication reactions and possible hospitalization or surgery if complications occur), benefits, and alternatives to colonoscopy with possible biopsy and possible polypectomy were discussed with the patient and he consents to proceed.   Kristopher [name] E. Candis Schatz, MD [facility]   CC:  Nona Dell, Corene Cornea, MD

## 2022-03-31 ENCOUNTER — Telehealth: Payer: Self-pay

## 2022-03-31 ENCOUNTER — Other Ambulatory Visit: Payer: Self-pay

## 2022-03-31 DIAGNOSIS — D509 Iron deficiency anemia, unspecified: Secondary | ICD-10-CM

## 2022-03-31 NOTE — Telephone Encounter (Signed)
Contacted patient and informed him that ECL would have to been done at the hospital. I offered patient 06/15/22. Patient agreed to this day. I let him know that instructions would be sent on mychart.

## 2022-04-03 ENCOUNTER — Ambulatory Visit (INDEPENDENT_AMBULATORY_CARE_PROVIDER_SITE_OTHER): Payer: Medicare HMO

## 2022-04-03 VITALS — BP 120/74 | HR 62 | Temp 98.3°F | Resp 16 | Ht 65.0 in | Wt 161.2 lb

## 2022-04-03 DIAGNOSIS — D509 Iron deficiency anemia, unspecified: Secondary | ICD-10-CM

## 2022-04-03 MED ORDER — SODIUM CHLORIDE 0.9 % IV SOLN
200.0000 mg | Freq: Once | INTRAVENOUS | Status: AC
  Administered 2022-04-03: 200 mg via INTRAVENOUS
  Filled 2022-04-03: qty 10

## 2022-04-03 NOTE — Progress Notes (Signed)
Diagnosis: Iron Deficiency Anemia  Provider:  Marshell Garfinkel MD  Procedure: Infusion  IV Type: Peripheral, IV Location: L Antecubital  Venofer (Iron Sucrose), Dose: 200 mg  Infusion Start Time: 9784  Infusion Stop Time: 7841  Post Infusion IV Care: Observation period completed and Peripheral IV Discontinued  Discharge: Condition: Good, Destination: Home . AVS provided to patient.   Performed by:  Adelina Mings, LPN

## 2022-04-05 DIAGNOSIS — M545 Low back pain, unspecified: Secondary | ICD-10-CM | POA: Diagnosis not present

## 2022-04-05 DIAGNOSIS — M6281 Muscle weakness (generalized): Secondary | ICD-10-CM | POA: Diagnosis not present

## 2022-04-05 DIAGNOSIS — Z9181 History of falling: Secondary | ICD-10-CM | POA: Diagnosis not present

## 2022-04-10 ENCOUNTER — Ambulatory Visit (INDEPENDENT_AMBULATORY_CARE_PROVIDER_SITE_OTHER): Payer: Medicare HMO

## 2022-04-10 VITALS — BP 101/64 | HR 96 | Temp 98.0°F | Resp 16 | Ht 65.0 in | Wt 163.2 lb

## 2022-04-10 DIAGNOSIS — D509 Iron deficiency anemia, unspecified: Secondary | ICD-10-CM

## 2022-04-10 MED ORDER — SODIUM CHLORIDE 0.9 % IV SOLN
200.0000 mg | Freq: Once | INTRAVENOUS | Status: AC
  Administered 2022-04-10: 200 mg via INTRAVENOUS
  Filled 2022-04-10: qty 10

## 2022-04-10 NOTE — Progress Notes (Signed)
Diagnosis: Iron Deficiency Anemia  Provider:  Marshell Garfinkel MD  Procedure: Infusion  IV Type: Peripheral, IV Location: R Antecubital  Venofer (Iron Sucrose), Dose: 200 mg  Infusion Start Time: 2508  Infusion Stop Time: 7199  Post Infusion IV Care: Peripheral IV Discontinued  Discharge: Condition: Good, Destination: Home . AVS provided to patient.   Performed by:  Koren Shiver, RN

## 2022-04-12 DIAGNOSIS — I2510 Atherosclerotic heart disease of native coronary artery without angina pectoris: Secondary | ICD-10-CM | POA: Diagnosis not present

## 2022-04-12 DIAGNOSIS — Z8679 Personal history of other diseases of the circulatory system: Secondary | ICD-10-CM | POA: Diagnosis not present

## 2022-04-12 DIAGNOSIS — I255 Ischemic cardiomyopathy: Secondary | ICD-10-CM | POA: Diagnosis not present

## 2022-04-17 ENCOUNTER — Ambulatory Visit (INDEPENDENT_AMBULATORY_CARE_PROVIDER_SITE_OTHER): Payer: Medicare HMO

## 2022-04-17 VITALS — BP 123/79 | HR 75 | Temp 98.1°F | Resp 16 | Ht 65.0 in | Wt 169.8 lb

## 2022-04-17 DIAGNOSIS — D509 Iron deficiency anemia, unspecified: Secondary | ICD-10-CM

## 2022-04-17 MED ORDER — SODIUM CHLORIDE 0.9 % IV SOLN
200.0000 mg | Freq: Once | INTRAVENOUS | Status: AC
  Administered 2022-04-17: 200 mg via INTRAVENOUS
  Filled 2022-04-17: qty 10

## 2022-04-17 NOTE — Progress Notes (Signed)
Diagnosis: Iron Deficiency Anemia  Provider:  Marshell Garfinkel MD  Procedure: Infusion  IV Type: Peripheral, IV Location: R Hand  Venofer (Iron Sucrose), Dose: 200 mg  Infusion Start Time: 9800  Infusion Stop Time: 1239  Post Infusion IV Care: Peripheral IV Discontinued  Discharge: Condition: Good, Destination: Home . AVS provided to patient.   Performed by:  Adelina Mings, LPN

## 2022-04-19 DIAGNOSIS — G2581 Restless legs syndrome: Secondary | ICD-10-CM | POA: Diagnosis not present

## 2022-04-19 DIAGNOSIS — D509 Iron deficiency anemia, unspecified: Secondary | ICD-10-CM | POA: Diagnosis not present

## 2022-04-21 DIAGNOSIS — M6281 Muscle weakness (generalized): Secondary | ICD-10-CM | POA: Diagnosis not present

## 2022-04-21 DIAGNOSIS — Z9181 History of falling: Secondary | ICD-10-CM | POA: Diagnosis not present

## 2022-04-21 DIAGNOSIS — M545 Low back pain, unspecified: Secondary | ICD-10-CM | POA: Diagnosis not present

## 2022-04-24 ENCOUNTER — Ambulatory Visit (INDEPENDENT_AMBULATORY_CARE_PROVIDER_SITE_OTHER): Payer: Medicare HMO

## 2022-04-24 VITALS — BP 127/79 | HR 74 | Temp 97.9°F | Resp 18 | Ht 65.0 in | Wt 162.4 lb

## 2022-04-24 DIAGNOSIS — D509 Iron deficiency anemia, unspecified: Secondary | ICD-10-CM

## 2022-04-24 MED ORDER — SODIUM CHLORIDE 0.9 % IV SOLN
200.0000 mg | Freq: Once | INTRAVENOUS | Status: AC
  Administered 2022-04-24: 200 mg via INTRAVENOUS
  Filled 2022-04-24: qty 10

## 2022-04-24 NOTE — Progress Notes (Signed)
Diagnosis: Iron Deficiency Anemia  Provider:  Marshell Garfinkel MD  Procedure: Infusion  IV Type: Peripheral, IV Location: R Antecubital  Venofer (Iron Sucrose), Dose: 200 mg  Infusion Start Time: 1962  Infusion Stop Time: 2297  Post Infusion IV Care: Peripheral IV Discontinued  Discharge: Condition: Good, Destination: Home . AVS provided to patient.   Performed by:  Arnoldo Morale, RN

## 2022-04-27 ENCOUNTER — Ambulatory Visit: Payer: Medicare HMO

## 2022-04-27 ENCOUNTER — Encounter: Payer: Medicare HMO | Admitting: Gastroenterology

## 2022-04-27 DIAGNOSIS — D509 Iron deficiency anemia, unspecified: Secondary | ICD-10-CM

## 2022-04-28 LAB — CBC WITH DIFFERENTIAL/PLATELET
Basophils Absolute: 0 10*3/uL (ref 0.0–0.2)
Basos: 1 %
EOS (ABSOLUTE): 0.1 10*3/uL (ref 0.0–0.4)
Eos: 3 %
Hematocrit: 32.8 % — ABNORMAL LOW (ref 37.5–51.0)
Hemoglobin: 11 g/dL — ABNORMAL LOW (ref 13.0–17.7)
Immature Grans (Abs): 0 10*3/uL (ref 0.0–0.1)
Immature Granulocytes: 0 %
Lymphocytes Absolute: 0.8 10*3/uL (ref 0.7–3.1)
Lymphs: 21 %
MCH: 32.3 pg (ref 26.6–33.0)
MCHC: 33.5 g/dL (ref 31.5–35.7)
MCV: 96 fL (ref 79–97)
Monocytes Absolute: 0.3 10*3/uL (ref 0.1–0.9)
Monocytes: 8 %
Neutrophils Absolute: 2.6 10*3/uL (ref 1.4–7.0)
Neutrophils: 67 %
Platelets: 132 10*3/uL — ABNORMAL LOW (ref 150–450)
RBC: 3.41 x10E6/uL — ABNORMAL LOW (ref 4.14–5.80)
RDW: 15 % (ref 11.6–15.4)
WBC: 3.8 10*3/uL (ref 3.4–10.8)

## 2022-04-28 LAB — SPECIMEN STATUS REPORT

## 2022-05-01 ENCOUNTER — Ambulatory Visit (INDEPENDENT_AMBULATORY_CARE_PROVIDER_SITE_OTHER): Payer: Medicare HMO

## 2022-05-01 VITALS — BP 149/87 | HR 72 | Temp 97.7°F | Resp 18 | Ht 65.0 in | Wt 162.6 lb

## 2022-05-01 DIAGNOSIS — D509 Iron deficiency anemia, unspecified: Secondary | ICD-10-CM | POA: Diagnosis not present

## 2022-05-01 MED ORDER — SODIUM CHLORIDE 0.9 % IV SOLN
200.0000 mg | Freq: Once | INTRAVENOUS | Status: AC
  Administered 2022-05-01: 200 mg via INTRAVENOUS
  Filled 2022-05-01: qty 10

## 2022-05-01 NOTE — Progress Notes (Signed)
Diagnosis: Iron Deficiency Anemia  Provider:  Marshell Garfinkel MD  Procedure: Infusion  IV Type: Peripheral, IV Location: R Antecubital  Venofer (Iron Sucrose), Dose: 200 mg  Infusion Start Time: 9753  Infusion Stop Time: 0051  Post Infusion IV Care: Peripheral IV Discontinued  Discharge: Condition: Good, Destination: Home . AVS provided to patient.   Performed by:  Arnoldo Morale, RN

## 2022-05-02 DIAGNOSIS — M6281 Muscle weakness (generalized): Secondary | ICD-10-CM | POA: Diagnosis not present

## 2022-05-02 DIAGNOSIS — M545 Low back pain, unspecified: Secondary | ICD-10-CM | POA: Diagnosis not present

## 2022-05-02 DIAGNOSIS — Z9181 History of falling: Secondary | ICD-10-CM | POA: Diagnosis not present

## 2022-05-03 DIAGNOSIS — Z6825 Body mass index (BMI) 25.0-25.9, adult: Secondary | ICD-10-CM | POA: Diagnosis not present

## 2022-05-03 DIAGNOSIS — G2581 Restless legs syndrome: Secondary | ICD-10-CM | POA: Diagnosis not present

## 2022-05-05 ENCOUNTER — Ambulatory Visit: Payer: Medicare HMO

## 2022-05-05 ENCOUNTER — Encounter: Payer: Self-pay | Admitting: Pulmonary Disease

## 2022-05-05 DIAGNOSIS — D509 Iron deficiency anemia, unspecified: Secondary | ICD-10-CM | POA: Diagnosis not present

## 2022-05-05 DIAGNOSIS — Z9181 History of falling: Secondary | ICD-10-CM | POA: Diagnosis not present

## 2022-05-05 DIAGNOSIS — M6281 Muscle weakness (generalized): Secondary | ICD-10-CM | POA: Diagnosis not present

## 2022-05-05 DIAGNOSIS — M545 Low back pain, unspecified: Secondary | ICD-10-CM | POA: Diagnosis not present

## 2022-05-06 LAB — CBC WITH DIFFERENTIAL/PLATELET
Basophils Absolute: 0 10*3/uL (ref 0.0–0.2)
Basos: 1 %
EOS (ABSOLUTE): 0.1 10*3/uL (ref 0.0–0.4)
Eos: 2 %
Hematocrit: 34.2 % — ABNORMAL LOW (ref 37.5–51.0)
Hemoglobin: 11.3 g/dL — ABNORMAL LOW (ref 13.0–17.7)
Immature Grans (Abs): 0 10*3/uL (ref 0.0–0.1)
Immature Granulocytes: 0 %
Lymphocytes Absolute: 1 10*3/uL (ref 0.7–3.1)
Lymphs: 23 %
MCH: 32.3 pg (ref 26.6–33.0)
MCHC: 33 g/dL (ref 31.5–35.7)
MCV: 98 fL — ABNORMAL HIGH (ref 79–97)
Monocytes Absolute: 0.4 10*3/uL (ref 0.1–0.9)
Monocytes: 9 %
Neutrophils Absolute: 2.8 10*3/uL (ref 1.4–7.0)
Neutrophils: 65 %
Platelets: 137 10*3/uL — ABNORMAL LOW (ref 150–450)
RBC: 3.5 x10E6/uL — ABNORMAL LOW (ref 4.14–5.80)
RDW: 14.4 % (ref 11.6–15.4)
WBC: 4.3 10*3/uL (ref 3.4–10.8)

## 2022-06-02 ENCOUNTER — Telehealth: Payer: Self-pay

## 2022-06-02 NOTE — Patient Outreach (Signed)
  Care Coordination   Initial Visit Note   06/02/2022 Name: Kristopher [name] MRN: [id number] DOB: [birth year]  Kristopher [name] is a 71 y.o. year old male who sees Townsend Roger, MD for primary care. I spoke with  Kristopher [name] by phone today.  What matters to the patients health and wellness today?  Placed call to patient to review and offer [facility] care coordination program. Patient reports that he is doing well and denies any needs.     SDOH assessments and interventions completed:  No     Care Coordination Interventions:  No, not indicated   Follow up plan: No further intervention required.   Encounter Outcome:  Pt. Refused   Tomasa Rand, RN, BSN, CEN [facility] [employer] [phone]

## 2022-06-06 ENCOUNTER — Encounter (HOSPITAL_COMMUNITY): Payer: Self-pay | Admitting: Gastroenterology

## 2022-06-06 ENCOUNTER — Telehealth: Payer: Self-pay

## 2022-06-06 DIAGNOSIS — Z959 Presence of cardiac and vascular implant and graft, unspecified: Secondary | ICD-10-CM | POA: Diagnosis not present

## 2022-06-06 NOTE — Telephone Encounter (Signed)
Received fax from [facility] stating that patient has stopped plavix and is now on Eliquis which he is to hold for four doses prior to his Colonoscopy. Patient is to restart the next day as long as there is no concern for bleeding.

## 2022-06-14 NOTE — Anesthesia Preprocedure Evaluation (Addendum)
Anesthesia Evaluation  Patient identified by MRN, date of birth, ID band Patient awake    Reviewed: Allergy & Precautions, NPO status , Patient's Chart, lab work & pertinent test results  Airway Mallampati: II  TM Distance: >3 FB Neck ROM: Full    Dental no notable dental hx.    Pulmonary sleep apnea , former smoker   Pulmonary exam normal        Cardiovascular hypertension, + CAD, + Past MI and + Cardiac Stents  + dysrhythmias Atrial Fibrillation  Rhythm:Regular Rate:Normal  ECHO 2022:  1. Left ventricular ejection fraction, by estimation, is 50 to 55%. The  left ventricle has low normal function. The left ventricle demonstrates  regional wall motion abnormalities (see scoring diagram/findings for  description). There is mild left  ventricular hypertrophy. Left ventricular diastolic parameters are  consistent with Grade II diastolic dysfunction (pseudonormalization).  Elevated left ventricular end-diastolic pressure. There is moderate  hypokinesis of the left ventricular, basal  inferior wall and inferoseptal wall.   2. Right ventricular systolic function is low normal. The right  ventricular size is normal.   3. Effusion measures up to 1.17 cm posterior to the LV. a small  pericardial effusion is present. The pericardial effusion is posterior to  the left ventricle. There is no evidence of cardiac tamponade.   4. The mitral valve is abnormal. Mild mitral valve regurgitation.   5. The aortic valve is tricuspid. Aortic valve regurgitation is not  visualized. No aortic stenosis is present.   6. The inferior vena cava is dilated in size with <50% respiratory  variability, suggesting right atrial pressure of 15 mmHg.      Neuro/Psych    Depression    negative neurological ROS     GI/Hepatic ,GERD  Medicated,,  Endo/Other  diabetes, Type 2, Oral Hypoglycemic Agents    Renal/GU      Musculoskeletal  (+) Arthritis ,  Osteoarthritis,    Abdominal Normal abdominal exam  (+)   Peds  Hematology  (+) Blood dyscrasia, anemia   Anesthesia Other Findings   Reproductive/Obstetrics                             Anesthesia Physical Anesthesia Plan  ASA: 3  Anesthesia Plan: MAC   Post-op Pain Management:    Induction: Intravenous  PONV Risk Score and Plan: Propofol infusion and Treatment may vary due to age or medical condition  Airway Management Planned: Simple Face Mask, Natural Airway and Nasal Cannula  Additional Equipment: None  Intra-op Plan:   Post-operative Plan:   Informed Consent: I have reviewed the patients History and Physical, chart, labs and discussed the procedure including the risks, benefits and alternatives for the proposed anesthesia with the patient or authorized representative who has indicated his/her understanding and acceptance.     Dental advisory given  Plan Discussed with: CRNA  Anesthesia Plan Comments:        Anesthesia Quick Evaluation

## 2022-06-15 ENCOUNTER — Ambulatory Visit (HOSPITAL_COMMUNITY): Payer: Medicare HMO | Admitting: Anesthesiology

## 2022-06-15 ENCOUNTER — Encounter (HOSPITAL_COMMUNITY)
Admission: RE | Disposition: A | Discharge status: Home / Self Care | Payer: Self-pay | Source: Home / Self Care | Attending: Gastroenterology

## 2022-06-15 ENCOUNTER — Ambulatory Visit (HOSPITAL_BASED_OUTPATIENT_CLINIC_OR_DEPARTMENT_OTHER): Payer: Medicare HMO | Admitting: Anesthesiology

## 2022-06-15 ENCOUNTER — Encounter (HOSPITAL_COMMUNITY): Payer: Self-pay | Admitting: Gastroenterology

## 2022-06-15 ENCOUNTER — Other Ambulatory Visit: Payer: Self-pay

## 2022-06-15 ENCOUNTER — Ambulatory Visit (HOSPITAL_COMMUNITY)
Admission: RE | Admit: 2022-06-15 | Discharge: 2022-06-15 | Disposition: A | Discharge status: Home / Self Care | Payer: Medicare HMO | Attending: Gastroenterology | Admitting: Gastroenterology

## 2022-06-15 DIAGNOSIS — K219 Gastro-esophageal reflux disease without esophagitis: Secondary | ICD-10-CM | POA: Insufficient documentation

## 2022-06-15 DIAGNOSIS — K5730 Diverticulosis of large intestine without perforation or abscess without bleeding: Secondary | ICD-10-CM | POA: Diagnosis not present

## 2022-06-15 DIAGNOSIS — Z959 Presence of cardiac and vascular implant and graft, unspecified: Secondary | ICD-10-CM | POA: Diagnosis not present

## 2022-06-15 DIAGNOSIS — Z7902 Long term (current) use of antithrombotics/antiplatelets: Secondary | ICD-10-CM | POA: Insufficient documentation

## 2022-06-15 DIAGNOSIS — I252 Old myocardial infarction: Secondary | ICD-10-CM | POA: Diagnosis not present

## 2022-06-15 DIAGNOSIS — F32A Depression, unspecified: Secondary | ICD-10-CM | POA: Diagnosis not present

## 2022-06-15 DIAGNOSIS — M1990 Unspecified osteoarthritis, unspecified site: Secondary | ICD-10-CM | POA: Insufficient documentation

## 2022-06-15 DIAGNOSIS — K2950 Unspecified chronic gastritis without bleeding: Secondary | ICD-10-CM | POA: Diagnosis not present

## 2022-06-15 DIAGNOSIS — Z87891 Personal history of nicotine dependence: Secondary | ICD-10-CM | POA: Diagnosis not present

## 2022-06-15 DIAGNOSIS — I340 Nonrheumatic mitral (valve) insufficiency: Secondary | ICD-10-CM | POA: Diagnosis not present

## 2022-06-15 DIAGNOSIS — K640 First degree hemorrhoids: Secondary | ICD-10-CM | POA: Diagnosis not present

## 2022-06-15 DIAGNOSIS — I2510 Atherosclerotic heart disease of native coronary artery without angina pectoris: Secondary | ICD-10-CM | POA: Diagnosis not present

## 2022-06-15 DIAGNOSIS — D128 Benign neoplasm of rectum: Secondary | ICD-10-CM

## 2022-06-15 DIAGNOSIS — I10 Essential (primary) hypertension: Secondary | ICD-10-CM | POA: Insufficient documentation

## 2022-06-15 DIAGNOSIS — D122 Benign neoplasm of ascending colon: Secondary | ICD-10-CM | POA: Insufficient documentation

## 2022-06-15 DIAGNOSIS — G4730 Sleep apnea, unspecified: Secondary | ICD-10-CM | POA: Diagnosis not present

## 2022-06-15 DIAGNOSIS — Z7982 Long term (current) use of aspirin: Secondary | ICD-10-CM | POA: Diagnosis not present

## 2022-06-15 DIAGNOSIS — K621 Rectal polyp: Secondary | ICD-10-CM | POA: Diagnosis not present

## 2022-06-15 DIAGNOSIS — D509 Iron deficiency anemia, unspecified: Secondary | ICD-10-CM | POA: Insufficient documentation

## 2022-06-15 DIAGNOSIS — Z955 Presence of coronary angioplasty implant and graft: Secondary | ICD-10-CM | POA: Insufficient documentation

## 2022-06-15 DIAGNOSIS — E119 Type 2 diabetes mellitus without complications: Secondary | ICD-10-CM | POA: Insufficient documentation

## 2022-06-15 DIAGNOSIS — K635 Polyp of colon: Secondary | ICD-10-CM | POA: Diagnosis not present

## 2022-06-15 DIAGNOSIS — I4891 Unspecified atrial fibrillation: Secondary | ICD-10-CM | POA: Insufficient documentation

## 2022-06-15 DIAGNOSIS — Z7984 Long term (current) use of oral hypoglycemic drugs: Secondary | ICD-10-CM

## 2022-06-15 DIAGNOSIS — K644 Residual hemorrhoidal skin tags: Secondary | ICD-10-CM

## 2022-06-15 DIAGNOSIS — I517 Cardiomegaly: Secondary | ICD-10-CM | POA: Insufficient documentation

## 2022-06-15 DIAGNOSIS — K7581 Nonalcoholic steatohepatitis (NASH): Secondary | ICD-10-CM | POA: Insufficient documentation

## 2022-06-15 HISTORY — PX: ESOPHAGOGASTRODUODENOSCOPY (EGD) WITH PROPOFOL: SHX5813

## 2022-06-15 HISTORY — PX: COLONOSCOPY WITH PROPOFOL: SHX5780

## 2022-06-15 HISTORY — PX: POLYPECTOMY: SHX5525

## 2022-06-15 HISTORY — PX: BIOPSY: SHX5522

## 2022-06-15 LAB — GLUCOSE, CAPILLARY: Glucose-Capillary: 112 mg/dL — ABNORMAL HIGH (ref 70–99)

## 2022-06-15 SURGERY — COLONOSCOPY WITH PROPOFOL
Anesthesia: Monitor Anesthesia Care

## 2022-06-15 MED ORDER — DEXMEDETOMIDINE HCL IN NACL 80 MCG/20ML IV SOLN
INTRAVENOUS | Status: DC | PRN
  Administered 2022-06-15: 20 ug via BUCCAL

## 2022-06-15 MED ORDER — PROPOFOL 500 MG/50ML IV EMUL
INTRAVENOUS | Status: DC | PRN
  Administered 2022-06-15: 150 ug/kg/min via INTRAVENOUS

## 2022-06-15 MED ORDER — PROPOFOL 10 MG/ML IV BOLUS
INTRAVENOUS | Status: DC | PRN
  Administered 2022-06-15: 100 mg via INTRAVENOUS

## 2022-06-15 MED ORDER — MIDAZOLAM HCL 2 MG/2ML IJ SOLN
INTRAMUSCULAR | Status: AC
  Filled 2022-06-15: qty 2

## 2022-06-15 MED ORDER — SODIUM CHLORIDE 0.9 % IV SOLN: INTRAVENOUS | Status: DC

## 2022-06-15 MED ORDER — MIDAZOLAM HCL 2 MG/2ML IJ SOLN
INTRAMUSCULAR | Status: DC | PRN
  Administered 2022-06-15: 2 mg via INTRAVENOUS

## 2022-06-15 MED ORDER — LACTATED RINGERS IV SOLN: INTRAVENOUS | Status: DC

## 2022-06-15 MED ORDER — LIDOCAINE HCL (CARDIAC) PF 100 MG/5ML IV SOSY
PREFILLED_SYRINGE | INTRAVENOUS | Status: DC | PRN
  Administered 2022-06-15: 100 mg via INTRAVENOUS

## 2022-06-15 SURGICAL SUPPLY — 25 items

## 2022-06-15 NOTE — Op Note (Signed)
[facility] Patient Name: Kristopher [name] Procedure Date: 06/15/2022 MRN: [id number] Attending MD: Gladstone Pih. Candis Schatz , MD, [id number] Date of Birth: 17-Sep-1950 CSN: [id number] Age: 71 Admit Type: Outpatient Procedure:                Colonoscopy Indications:              Iron deficiency anemia Providers:                Nicki Reaper E. Candis Schatz, MD, Dulcy Fanny,                            William Dalton, Technician Referring MD:              Medicines:                Monitored Anesthesia Care Complications:            No immediate complications. Estimated Blood Loss:     Estimated blood loss was minimal. Procedure:                Pre-Anesthesia Assessment:                           - Prior to the procedure, a History and Physical                            was performed, and patient medications and                            allergies were reviewed. The patient's tolerance of                            previous anesthesia was also reviewed. The risks                            and benefits of the procedure and the sedation                            options and risks were discussed with the patient.                            All questions were answered, and informed consent                            was obtained. Prior Anticoagulants: The patient has                            taken Eliquis (apixaban), last dose was 2 days                            prior to procedure. ASA Grade Assessment: III - A                            patient with severe systemic disease. After  reviewing the risks and benefits, the patient was                            deemed in satisfactory condition to undergo the                            procedure.                           After obtaining informed consent, the colonoscope                            was passed under direct vision. Throughout the                            procedure, the patient's blood pressure,  pulse, and                            oxygen saturations were monitored continuously. The                            CF-HQ190L ([id number] Olympus colonoscope was                            introduced through the anus and advanced to the the                            terminal ileum, with identification of the                            appendiceal orifice and IC valve. The colonoscopy                            was somewhat difficult due to a redundant colon,                            borderline prep quality and the patient's position                            intolerance (patient had frequent involuntary                            jerking movements throughout the entire                            colonoscopy). Successful completion of the                            procedure was aided by using manual pressure. The                            patient tolerated the procedure fairly well. The  quality of the bowel preparation was adequate. The                            terminal ileum, ileocecal valve, appendiceal                            orifice, and rectum were photographed. Scope In: 7:58:23 AM Scope Out: 8:32:07 AM Scope Withdrawal Time: 0 hours 25 minutes 49 seconds  Total Procedure Duration: 0 hours 33 minutes 44 seconds  Findings:      Skin tags were found on perianal exam.      The digital rectal exam was normal. Pertinent negatives include normal       sphincter tone and no palpable rectal lesions.      A 4 mm polyp was found in the ascending colon. The polyp was sessile.       The polyp was removed with a cold snare. Resection and retrieval were       complete. Estimated blood loss was minimal.      Three sessile polyps were found in the rectum. The polyps were 2 to 4 mm       in size. These polyps were removed with a cold snare. Resection and       retrieval were complete. Estimated blood loss was minimal.      Many medium-mouthed and small-mouthed  diverticula were found in the       descending colon, transverse colon and ascending colon. There was no       evidence of diverticular bleeding.      The exam was otherwise normal throughout the examined colon.      The terminal ileum appeared normal.      Non-bleeding internal hemorrhoids were found during retroflexion. The       hemorrhoids were Grade I (internal hemorrhoids that do not prolapse).      No additional abnormalities were found on retroflexion. Impression:               - Perianal skin tags found on perianal exam.                           - One 4 mm polyp in the ascending colon, removed                            with a cold snare. Resected and retrieved.                           - Three 2 to 4 mm polyps in the rectum, removed                            with a cold snare. Resected and retrieved.                           - Moderate diverticulosis in the descending colon,                            in the transverse colon and in the ascending colon.  There was no evidence of diverticular bleeding.                           - The examined portion of the ileum was normal.                           - Non-bleeding internal hemorrhoids.                           - No endoscopic findings to explain patient's iron                            deficiency anemia. Moderate Sedation:      Not Applicable - Patient had care per Anesthesia. Recommendation:           - Patient has a contact number available for                            emergencies. The signs and symptoms of potential                            delayed complications were discussed with the                            patient. Return to normal activities tomorrow.                            Written discharge instructions were provided to the                            patient.                           - Resume previous diet.                           - Resume Eliquis (apixaban) at prior dose  tomorrow.                           - Await pathology results.                           - Repeat colonoscopy (date not yet determined) for                            surveillance based on pathology results.                           - Would recommend augmented bowel prep with next                            colonoscopy to improve quality of prep.                           - Consider video capsule endoscopy if biopsies from  EGD do not reveal H. pylori or celiac disease to                            explain iron deficiency. Procedure Code(s):        --- Professional ---                           [phone], Colonoscopy, flexible; with removal of                            tumor(s), polyp(s), or other lesion(s) by snare                            technique Diagnosis Code(s):        --- Professional ---                           D12.2, Benign neoplasm of ascending colon                           D12.8, Benign neoplasm of rectum                           K64.0, First degree hemorrhoids                           K64.4, Residual hemorrhoidal skin tags                           D50.9, Iron deficiency anemia, unspecified                           K57.30, Diverticulosis of large intestine without                            perforation or abscess without bleeding CPT copyright 2022 American [employer]. All rights reserved. The codes documented in this report are preliminary and upon coder review may  be revised to meet current compliance requirements. Sabriel Borromeo E. Candis Schatz, MD 06/15/2022 8:52:14 AM This report has been signed electronically. Number of Addenda: 0

## 2022-06-15 NOTE — Discharge Instructions (Signed)
YOU HAD AN ENDOSCOPIC PROCEDURE TODAY: Refer to the procedure report and other information in the discharge instructions given to you for any specific questions about what was found during the examination. If this information does not answer your questions, please call [place] office at [phone] to clarify.  ° °YOU SHOULD EXPECT: Some feelings of bloating in the abdomen. Passage of more gas than usual. Walking can help get rid of the air that was put into your GI tract during the procedure and reduce the bloating. If you had a lower endoscopy (such as a colonoscopy or flexible sigmoidoscopy) you may notice spotting of blood in your stool or on the toilet paper. Some abdominal soreness may be present for a day or two, also. ° °DIET: Your first meal following the procedure should be a light meal and then it is ok to progress to your normal diet. A half-sandwich or bowl of soup is an example of a good first meal. Heavy or fried foods are harder to digest and may make you feel nauseous or bloated. Drink plenty of fluids but you should avoid alcoholic beverages for 24 hours. If you had a esophageal dilation, please see attached instructions for diet.   ° °ACTIVITY: Your care partner should take you home directly after the procedure. You should plan to take it easy, moving slowly for the rest of the day. You can resume normal activity the day after the procedure however YOU SHOULD NOT DRIVE, use power tools, machinery or perform tasks that involve climbing or major physical exertion for 24 hours (because of the sedation medicines used during the test).  ° °SYMPTOMS TO REPORT IMMEDIATELY: °A gastroenterologist can be reached at any hour. Please call [phone]  for any of the following symptoms:  °Following lower endoscopy (colonoscopy, flexible sigmoidoscopy) °Excessive amounts of blood in the stool  °Significant tenderness, worsening of abdominal pains  °Swelling of the abdomen that is new, acute  °Fever of 100° or  higher  °Following upper endoscopy (EGD, EUS, ERCP, esophageal dilation) °Vomiting of blood or coffee ground material  °New, significant abdominal pain  °New, significant chest pain or pain under the shoulder blades  °Painful or persistently difficult swallowing  °New shortness of breath  °Black, tarry-looking or red, bloody stools ° °FOLLOW UP:  °If any biopsies were taken you will be contacted by phone or by letter within the next 1-3 weeks. Call [phone]  if you have not heard about the biopsies in 3 weeks.  °Please also call with any specific questions about appointments or follow up tests. ° °

## 2022-06-15 NOTE — Transfer of Care (Signed)
Immediate Anesthesia Transfer of Care Note  Patient: Kristopher [name]  Procedure(s) Performed: COLONOSCOPY WITH PROPOFOL ESOPHAGOGASTRODUODENOSCOPY (EGD) WITH PROPOFOL BIOPSY POLYPECTOMY  Patient Location: Short Stay  Anesthesia Type:MAC  Level of Consciousness: awake, drowsy, and patient cooperative  Airway & Oxygen Therapy: Patient Spontanous Breathing  Post-op Assessment: Report given to RN, Post -op Vital signs reviewed and stable, and Patient moving all extremities X 4  Post vital signs: Reviewed and stable  Last Vitals:  Vitals Value Taken Time  BP 96/47 06/15/22 0840  Temp 36.3 C 06/15/22 0839  Pulse 81 06/15/22 0841  Resp 18 06/15/22 0841  SpO2 94 % 06/15/22 0841  Vitals shown include unvalidated device data.  Last Pain:  Vitals:   06/15/22 0839  TempSrc: Tympanic  PainSc: Asleep         Complications: No notable events documented.

## 2022-06-15 NOTE — Anesthesia Postprocedure Evaluation (Signed)
Anesthesia Post Note  Patient: CAMARA ROSANDER  Procedure(s) Performed: COLONOSCOPY WITH PROPOFOL ESOPHAGOGASTRODUODENOSCOPY (EGD) WITH PROPOFOL BIOPSY POLYPECTOMY     Patient location during evaluation: Endoscopy Anesthesia Type: MAC Level of consciousness: awake and alert Pain management: pain level controlled Vital Signs Assessment: post-procedure vital signs reviewed and stable Respiratory status: spontaneous breathing, nonlabored ventilation, respiratory function stable and patient connected to nasal cannula oxygen Cardiovascular status: stable and blood pressure returned to baseline Postop Assessment: no apparent nausea or vomiting Anesthetic complications: no   No notable events documented.  Last Vitals:  Vitals:   06/15/22 0859 06/15/22 0909  BP: (!) 82/61 110/71  Pulse: 85 75  Resp: (!) 22 16  Temp:    SpO2: 98% 99%    Last Pain:  Vitals:   06/15/22 0909  TempSrc:   PainSc: 0-No pain                 March Rummage Dannis Deroche

## 2022-06-15 NOTE — Op Note (Signed)
[facility] Patient Name: Kristopher [name] Procedure Date: 06/15/2022 MRN: [id number] Attending MD: Gladstone Pih. Candis Schatz , MD, [id number] Date of Birth: 1951/05/26 CSN: [id number] Age: 71 Admit Type: Outpatient Procedure:                Upper GI endoscopy Indications:              Iron deficiency anemia Providers:                Nicki Reaper E. Candis Schatz, MD, Dulcy Fanny,                            William Dalton, Technician Referring MD:              Medicines:                Monitored Anesthesia Care Complications:            No immediate complications. Estimated Blood Loss:     Estimated blood loss was minimal. Procedure:                Pre-Anesthesia Assessment:                           - Prior to the procedure, a History and Physical                            was performed, and patient medications and                            allergies were reviewed. The patient's tolerance of                            previous anesthesia was also reviewed. The risks                            and benefits of the procedure and the sedation                            options and risks were discussed with the patient.                            All questions were answered, and informed consent                            was obtained. Prior Anticoagulants: The patient has                            taken Eliquis (apixaban), last dose was 2 days                            prior to procedure. ASA Grade Assessment: III - A                            patient with severe systemic disease. After  reviewing the risks and benefits, the patient was                            deemed in satisfactory condition to undergo the                            procedure.                           After obtaining informed consent, the endoscope was                            passed under direct vision. Throughout the                            procedure, the patient's blood  pressure, pulse, and                            oxygen saturations were monitored continuously. The                            [id number]-[id number] ([id number] Olympus endoscope was introduced                            through the mouth, and advanced to the second part                            of duodenum. The upper GI endoscopy was                            accomplished without difficulty. The patient                            tolerated the procedure well. Scope In: Scope Out: Findings:      The examined portions of the nasopharynx, oropharynx and larynx were       normal.      The examined esophagus was normal.      The entire examined stomach was normal. Biopsies were taken with a cold       forceps for Helicobacter pylori testing. Estimated blood loss was       minimal.      The examined duodenum was normal. Biopsies for histology were taken with       a cold forceps for evaluation of celiac disease. Estimated blood loss       was minimal. Impression:               - The examined portions of the nasopharynx,                            oropharynx and larynx were normal.                           - Normal esophagus.                           - Normal stomach. Biopsied.                           -  Normal examined duodenum. Biopsied.                           - No endoscopic abnormalities to explain patient's                            iron deficiency anemia. Moderate Sedation:      Not Applicable - Patient had care per Anesthesia. Recommendation:           - Patient has a contact number available for                            emergencies. The signs and symptoms of potential                            delayed complications were discussed with the                            patient. Return to normal activities tomorrow.                            Written discharge instructions were provided to the                            patient.                           - Resume previous diet.                            - Continue present medications.                           - Await pathology results. Procedure Code(s):        --- Professional ---                           [phone], Esophagogastroduodenoscopy, flexible,                            transoral; with biopsy, single or multiple Diagnosis Code(s):        --- Professional ---                           D50.9, Iron deficiency anemia, unspecified CPT copyright 2022 American [employer] Association. All rights reserved. The codes documented in this report are preliminary and upon coder review may  be revised to meet current compliance requirements. Twain Stenseth E. Candis Schatz, MD 06/15/2022 8:39:33 AM This report has been signed electronically. Number of Addenda: 0

## 2022-06-15 NOTE — H&P (Signed)
[facility] History and Physical   Primary Care Physician:  Townsend Roger, MD   Reason for Procedure:   Iron deficiency anemia  Plan:    EGD, colonoscopy     HPI: Kristopher [name] is a 71 y.o. male with CAD s/p stent, A-fib and compensated NASH cirrhosis undergoing EGD and colonoscopy to evaluate chronic iron deficiency anemia.  He has no overt GI bleeding.  He has episodic nausea and vomiting, but no other chronic GI symptoms.  He was on aspirin and Plavix for CAD and a-fib, but was recently changed to Eliquis, last dose 2 days ago.  He has no family history of colon cancer   Past Medical History:  Diagnosis Date   Anemia 12/2020   Anginal pain ([place])    Atrial fibrillation ([place])    a. s/p PVI X2 by Dr Omelia Blackwater at [facility] around 2000.   CAD (coronary artery disease)    a. NSTEMI (06/2013):  LHC (06/27/2013):  pLAD 10-20, RCA 90, EF 45-50%, inf HK.  PCI:  Xience Alpine (3 x 23 mm) DES to the RCA. b. Relook cath 07/10/13 - patent stent. Imdur added. c. Recurrent CP after that - abnormal nuc 07/2017 with chest pain, dyspnea, hypotensive response. Rest images with mid-basal inferior thinning. No stress images due to abrupt d/c of test. Relook cath 3/12 - no significant r   Chronic back pain greater than 3 months duration    Cirrhosis, non-alcoholic (HCC)    Depression    DJD (degenerative joint disease) of cervical spine    "3 herniated discs" (09/01/2013)   DJD (degenerative joint disease), lumbosacral    "bulging L5-S1" (09/01/2013)   Dysrhythmia    PVCs. A.fib with two ablations   Herniated cervical disc    c2-T1 fusion   History of kidney stones    turned into urosepsis   HLD (hyperlipidemia)    Ischemic cardiomyopathy    a. Echo (06/28/2011): Inferior and inferoseptal akinesis, EF 47%, grade 2 diastolic dysfunction, MAC, small effusion (no tamponade). B. EF 40% by echo 06/2013, cath 07/2013, 55% by cath 08/2013 with subtle mild mid inferior hypocontractility.   Liver cirrhosis  secondary to NASH [facility])    Neuromuscular disorder ([place])    bilateral neuropathy legs   NSTEMI (non-ST elevated myocardial infarction) ([place]) 06/27/2013   Osteoarthritis    "neck and lower back" (09/01/2013)   Pericardial effusion    a. Chronic since hx of pericarditis in 2004. b. Mod by echo 06/2013, small by echo 06/2013.   Pneumonia ~ 2005   Type II diabetes mellitus ([place])    Vaso vagal episode    wears continuous loop recorder    Past Surgical History:  Procedure Laterality Date   APPENDECTOMY  ~ [place]  06/19/1998   Dr Omelia Blackwater at [place] URETHROGRAM N/A 07/21/2021   Procedure: [place];  Surgeon: [name], MD;  Location: [facility];  Service: Urology;  Laterality: N/A;  45 MINS   LEFT AND RIGHT HEART CATHETERIZATION WITH CORONARY ANGIOGRAM N/A 10/07/2013   Procedure: LEFT AND RIGHT HEART CATHETERIZATION WITH CORONARY ANGIOGRAM;  Surgeon: Troy Sine, MD;  Location: [facility] CATH LAB;  Service: Cardiovascular;  Laterality: N/A;   LEFT HEART CATHETERIZATION WITH CORONARY ANGIOGRAM N/A 06/27/2013   Procedure: LEFT HEART CATHETERIZATION WITH CORONARY ANGIOGRAM;  Surgeon: Troy Sine, MD;  Location: [facility] CATH  LAB;  Service: Cardiovascular;  Laterality: N/A;   LEFT HEART CATHETERIZATION WITH CORONARY ANGIOGRAM N/A 07/10/2013   Procedure: LEFT HEART CATHETERIZATION WITH CORONARY ANGIOGRAM;  Surgeon: Burnell Blanks, MD;  Location: [facility] CATH LAB;  Service: Cardiovascular;  Laterality: N/A;   LEFT HEART CATHETERIZATION WITH CORONARY ANGIOGRAM N/A 08/28/2013   Procedure: LEFT HEART CATHETERIZATION WITH CORONARY ANGIOGRAM;  Surgeon: Troy Sine, MD;  Location: [facility] CATH LAB;  Service: Cardiovascular;  Laterality: N/A;   NEPHROLITHOTOMY Left 02/11/2021   Procedure: LEFT NEPHROLITHOTOMY PERCUTANEOUS WITH SURGEON ACCESS;  Surgeon: [name], MD;   Location: [facility];  Service: Urology;  Laterality: Left;   PERCUTANEOUS CORONARY STENT INTERVENTION (PCI-S)  06/27/2013   Procedure: PERCUTANEOUS CORONARY STENT INTERVENTION (PCI-S);  Surgeon: Troy Sine, MD;  Location: [facility] CATH LAB;  Service: Cardiovascular;;   POSTERIOR CERVICAL FUSION/FORAMINOTOMY  06/20/2007   fusion c3-t1   PULMONARY VEIN ABLATION  06/20/1999   Dr Omelia Blackwater at [place]  06/19/1955   TRANSFORAMINAL LUMBAR INTERBODY FUSION W/ MIS 1 LEVEL Left 12/08/2021   Procedure: LEFT LUMBAR FIVE-SACRAL ONE MINIMALLY INVASIVE (MIS) TRANSFORAMINAL LUMBAR INTERBODY FUSION;  Surgeon: Judith Part, MD;  Location: [place];  Service: Neurosurgery;  Laterality: Left;    Prior to Admission medications   Medication Sig Start Date End Date Taking? Authorizing Provider  aspirin EC 81 MG tablet Take 81 mg by mouth every evening.    Yes [provider]  baclofen (LIORESAL) 10 MG tablet Take 10 mg by mouth 3 (three) times daily as needed for muscle spasms.   Yes [provider]  cetirizine (ZYRTEC) 10 MG tablet Take 10 mg by mouth daily as needed for allergies.   Yes [provider]  diltiazem (CARDIZEM SR) 120 MG 12 hr capsule Take 120 mg by mouth every 36 (thirty-six) hours. 04/21/21  Yes [provider]  ELIQUIS 5 MG TABS tablet Take 5 mg by mouth 2 (two) times daily.   Yes [provider]  escitalopram (LEXAPRO) 20 MG tablet Take 20 mg by mouth daily.   Yes [provider]  ferrous sulfate 325 (65 FE) MG tablet Take 325 mg by mouth 2 (two) times daily. 03/07/22  Yes [provider]  gabapentin (NEURONTIN) 600 MG tablet Take 600 mg by mouth 3 (three) times daily.   Yes [provider]  ibuprofen (ADVIL) 200 MG tablet Take 800 mg by mouth daily as needed for mild pain.   Yes [provider]  metFORMIN (GLUCOPHAGE-XR) 500 MG 24 hr tablet Take 1,000 mg by mouth 2 (two) times daily. 07/25/19  Yes [provider]  Misc Natural Products (PUMPKIN SEED OIL) CAPS Take 1 capsule by mouth daily. 3000 mg   Yes [provider]  niacin 500 MG tablet Take 500 mg by mouth at bedtime.   Yes [provider]  pantoprazole (PROTONIX) 40 MG tablet Take 40 mg by mouth daily. 08/08/19  Yes [provider]  pioglitazone (ACTOS) 15 MG tablet Take 15 mg by mouth daily. 07/14/19  Yes [provider]  promethazine (PHENERGAN) 25 MG tablet Take 25 mg by mouth every 6 (six) hours as needed for nausea/vomiting. 06/21/21  Yes [provider]  Pseudoeph-Doxylamine-DM-APAP (NYQUIL PO) Take 1 Dose by mouth daily as needed (cold symptoms).   Yes [provider]  rOPINIRole (REQUIP) 2 MG tablet Take 2 mg by mouth at bedtime. 03/27/22  Yes [provider]  rosuvastatin (CRESTOR) 5 MG tablet Take 5  mg by mouth every evening. 10/07/15  Yes [provider]  Saw Palmetto 450 MG CAPS Take 450 mg by mouth at bedtime.   Yes [provider]  tamsulosin (FLOMAX) 0.4 MG CAPS capsule Take 0.4 mg by mouth at bedtime. 02/17/21  Yes [provider]  clopidogrel (PLAVIX) 75 MG tablet Take 1 tablet (75 mg total) by mouth daily. Restart on 12/13/21 Patient not taking: Reported on 06/13/2022 12/09/21   Judith Part, MD  Multiple Vitamins-Iron (MULTIVITAMINS WITH IRON) TABS tablet Take 1 tablet by mouth daily. Patient not taking: Reported on 06/13/2022 03/04/21   Niel Hummer A, MD    Current Facility-Administered Medications  Medication Dose Route Frequency Provider Last Rate Last Admin   0.9 %  sodium chloride infusion   Intravenous Continuous Daryel November, MD       lactated ringers infusion   Intravenous Continuous Daryel November, MD 10 mL/hr at 06/15/22 0729 New Bag at 06/15/22 0729    Allergies as of 03/31/2022 - Review Complete 03/29/2022  Allergen Reaction Noted   Codeine Nausea And Vomiting 06/26/2013   Duragesic-100 [fentanyl]  Nausea And Vomiting 09/29/2013   Naprosyn [naproxen] Nausea Only 12/20/2013   Other Nausea And Vomiting 06/26/2013    Family History  Problem Relation Age of Onset   Ovarian cancer Mother    Arthritis Mother    CAD Father    Liver disease Father    Diabetes Father    Arthritis Father    COPD Father    Arthritis Sister    Dementia Sister    Stroke Maternal Grandmother    Sudden death Maternal Grandfather    Heart attack Maternal Grandfather    ALS Paternal Grandmother    Cancer Paternal Grandfather        unknown   Arrhythmia Neg Hx     Social History   Socioeconomic History   Marital status: Married    Spouse name: Not on file   Number of children: 0   Years of education: Not on file   Highest education level: Not on file  Occupational History   Occupation: RESP/retired    Employer: [employer]    Comment: Scientist, clinical (histocompatibility and immunogenetics) at [employer] Use   Smoking status: Former    Packs/day: 1.50    Years: 43.00    Total pack years: 64.50    Types: Cigarettes    Quit date: 05/06/2007    Years since quitting: 15.1   Smokeless tobacco: Never  Vaping Use   Vaping Use: Never used  Substance and Sexual Activity   Alcohol use: Yes    Comment: once monthly/rarely   Drug use: No   Sexual activity: Not Currently  Other Topics Concern   Not on file  Social History Narrative   Not on file   Social Determinants of Health   Financial Resource Strain: Not on file  Food Insecurity: Not on file  Transportation Needs: Not on file  Physical Activity: Not on file  Stress: Not on file  Social Connections: Not on file  Intimate Partner Violence: Not on file    Review of Systems:  All other review of systems negative except as mentioned in the HPI.  Physical Exam: Vital signs BP (!) 144/73   Pulse 100   Temp (!) 97.5 F (36.4 C) (Temporal)   Ht _0  (1.651 m)   Wt 68 kg   SpO2 99%   BMI 24.96 kg/m   General:  Alert,  Well-developed,  well-nourished, pleasant and cooperative in NAD Airway:  Mallampati 2 Lungs:  Clear throughout to auscultation.   Heart:  Regular rate and rhythm; no murmurs, clicks, rubs,  or gallops. Abdomen:  Soft, nontender and nondistended. Normal bowel sounds.   Neuro/Psych:  Normal mood and affect. A and O x 3   Illiana Losurdo E. Candis Schatz, MD [facility]

## 2022-06-17 ENCOUNTER — Encounter (HOSPITAL_COMMUNITY): Payer: Self-pay | Admitting: Gastroenterology

## 2022-06-20 ENCOUNTER — Other Ambulatory Visit: Payer: Self-pay

## 2022-06-20 LAB — SURGICAL PATHOLOGY

## 2022-06-20 MED ORDER — ROPINIROLE HCL 2 MG PO TABS: 2.0000 mg | ORAL_TABLET | Freq: Every day | ORAL | 1 refills | Status: DC

## 2022-06-21 NOTE — Progress Notes (Signed)
Kristopher [name],  The biopsies taken from your stomach were notable for mild chronic gastritis (inflammation) which is a common finding, but there was no evidence of Helicobacter pylori infection. This common finding is not likely to explain iron deficiency anemia and there is no specific treatment or further evaluation recommended. The biopsies of your duodenum were unremarkable with no evidence of celiac disease.  At least two of the polyps that I removed during your recent procedure were completely benign but were proven to be "pre-cancerous" polyps that MAY have grown into cancers if they had not been removed.  Studies shows that at least 20% of women over age 29 and 30% of men over age 29 have pre-cancerous polyps.  Based on current nationally recognized surveillance guidelines, I recommend that you have a repeat colonoscopy in 5 years.   Because a cause of your iron deficiency anemia was not found on the EGD/Colonoscopy, I recommend we proceed with a capsule endoscopy as we discussed for further evaluation.  [name],  Can you please get Kristopher [name] set up for a video capsule endoscopy for further evaluation of iron deficiency anemia?  Thanks

## 2022-06-23 ENCOUNTER — Other Ambulatory Visit: Payer: Self-pay

## 2022-06-23 MED ORDER — PANTOPRAZOLE SODIUM 40 MG PO TBEC: 40.0000 mg | DELAYED_RELEASE_TABLET | Freq: Every day | ORAL | 1 refills | Status: AC

## 2022-06-27 ENCOUNTER — Other Ambulatory Visit: Payer: Self-pay

## 2022-06-27 DIAGNOSIS — D509 Iron deficiency anemia, unspecified: Secondary | ICD-10-CM

## 2022-06-30 ENCOUNTER — Encounter: Payer: Self-pay | Admitting: Internal Medicine

## 2022-06-30 ENCOUNTER — Ambulatory Visit: Payer: Medicare HMO | Admitting: Internal Medicine

## 2022-06-30 VITALS — BP 118/68 | HR 75 | Temp 97.4°F | Resp 16 | Ht 67.0 in | Wt 162.0 lb

## 2022-06-30 DIAGNOSIS — F330 Major depressive disorder, recurrent, mild: Secondary | ICD-10-CM | POA: Diagnosis not present

## 2022-06-30 DIAGNOSIS — E1159 Type 2 diabetes mellitus with other circulatory complications: Secondary | ICD-10-CM | POA: Diagnosis not present

## 2022-06-30 DIAGNOSIS — I480 Paroxysmal atrial fibrillation: Secondary | ICD-10-CM

## 2022-06-30 DIAGNOSIS — I700 Atherosclerosis of aorta: Secondary | ICD-10-CM

## 2022-06-30 DIAGNOSIS — D509 Iron deficiency anemia, unspecified: Secondary | ICD-10-CM

## 2022-06-30 DIAGNOSIS — Z6825 Body mass index (BMI) 25.0-25.9, adult: Secondary | ICD-10-CM | POA: Insufficient documentation

## 2022-06-30 DIAGNOSIS — M5126 Other intervertebral disc displacement, lumbar region: Secondary | ICD-10-CM

## 2022-06-30 DIAGNOSIS — I4891 Unspecified atrial fibrillation: Secondary | ICD-10-CM

## 2022-06-30 DIAGNOSIS — E1142 Type 2 diabetes mellitus with diabetic polyneuropathy: Secondary | ICD-10-CM

## 2022-06-30 DIAGNOSIS — Z0000 Encounter for general adult medical examination without abnormal findings: Secondary | ICD-10-CM

## 2022-06-30 DIAGNOSIS — E538 Deficiency of other specified B group vitamins: Secondary | ICD-10-CM

## 2022-06-30 DIAGNOSIS — R3911 Hesitancy of micturition: Secondary | ICD-10-CM | POA: Diagnosis not present

## 2022-06-30 DIAGNOSIS — I2510 Atherosclerotic heart disease of native coronary artery without angina pectoris: Secondary | ICD-10-CM | POA: Diagnosis not present

## 2022-06-30 DIAGNOSIS — G2581 Restless legs syndrome: Secondary | ICD-10-CM

## 2022-06-30 DIAGNOSIS — K7469 Other cirrhosis of liver: Secondary | ICD-10-CM

## 2022-06-30 DIAGNOSIS — I498 Other specified cardiac arrhythmias: Secondary | ICD-10-CM | POA: Diagnosis not present

## 2022-06-30 DIAGNOSIS — E7800 Pure hypercholesterolemia, unspecified: Secondary | ICD-10-CM

## 2022-06-30 DIAGNOSIS — M5020 Other cervical disc displacement, unspecified cervical region: Secondary | ICD-10-CM

## 2022-06-30 MED ORDER — PNEUMOCOCCAL 13-VAL CONJ VACC IM SUSP: 0.5000 mL | INTRAMUSCULAR | 0 refills | Status: AC

## 2022-06-30 MED ORDER — SHINGRIX 50 MCG/0.5ML IM SUSR: 0.5000 mL | Freq: Once | INTRAMUSCULAR | 0 refills | Status: AC

## 2022-06-30 NOTE — Assessment & Plan Note (Signed)
He did see EP in 11/2021 at [facility].  They switched him to eliquis at that time.  He remains on diltiazem XR.

## 2022-06-30 NOTE — Assessment & Plan Note (Signed)
We will check his Vit B12 levels today.

## 2022-06-30 NOTE — Assessment & Plan Note (Signed)
We will check ferritin and iron levels as he has had mulitiple iron infusions this past year.

## 2022-06-30 NOTE — Assessment & Plan Note (Signed)
He has diabetes with neuropathy and complications of CAD.  His goal A1c <7%.  His diabetic foot exam was normal today.  We will send for diabetic labs.

## 2022-06-30 NOTE — Assessment & Plan Note (Signed)
His RLS is improved with his current dose of requip.

## 2022-06-30 NOTE — Assessment & Plan Note (Signed)
I want him to remain active with exercise with daily walkiing.

## 2022-06-30 NOTE — Assessment & Plan Note (Signed)
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  We will continue to monitor.

## 2022-06-30 NOTE — Progress Notes (Addendum)
Preventive Screening-Counseling & Management     Kristopher [name] is a 72 y.o. male who presents for Medicare Annual/Subsequent preventive examination.  He had a dilated eye exam in 04/2022 with Dr. Clydene Laming at [facility] in [place]. The patient has no diabetic retinopathy but he has developed some mild cataracts.  He does have iron deficiency anemia where he had a colonoscopy and EGD done on 06/15/2022 that showed one polyp in the ascending colon and 3 polyps located in in his rectum with moderate diverticulosis without evidence of diverticular bleed.  His EGD was normal.  They are arranging him to have capsule endoscopy done on 07/16/2022.  His previous colonoscopy was done in 12/2002 and was normal per the patient. He is followed by [facility] for his history of cirrhosis (see below). They did an EGD in 01/2016 which showed a normal esophagus but he had a few small non-bleeding erosions in the gastric antrum and mild portal hypertensive gastropathy in the gastric body. He does take pantoprazole once a day where he denies any reflux symptoms. He does exercise by walking daily. He does get yearly flu vaccines. He had a pneumovax 23 vaccine in 2019. I wrote him for the shingrix and Prevnar 13 vaccines last year but he never did go get them. He had the zostavax in 2004. He has had 4 COVID-19 vaccines including 2 boosters. He denies any anxiety or memory loss. He is on ASA 70m daily and is no longer on Plavix 786mdaily but he is on eliquis 70m46mID.   I did see Kristopher [name] in 04/2022 where we followed up with him for restless leg syndrome.  He remains on baclofen and he was sent to see neurology (no notes seen) where they increased his ropinrole to 2mg13mghtly.  This has helped with his kicking.  I also felt that his RLS would improve with iron infusions.  Again, the patient had a L5-S1 minimally invasive laminectomy and transforaminal lumbar interbody fusion on 12/08/2021.  Today, he states that the shocking pains  from above goes down his legs are instant but this has improved since her last visit.   He denies any weakness or numbness in his lower extremities and no loss of bowel/bladder function.  The patient was in physical therapy but has completed it at this time.    The patient returns today for followup fo his chronic low back back pain where he underwent a lumbar spinal laminectomy in 11/2021.  Again, he has been seen by neuroortho for chronic low back pain that has been going on for the last 10 years.  He states this pain intermittent in intensity nd he states his low back pain has improved since his recent surgery.   He has had 3 ESI's by neuroortho which has not helped.  The patient tells me he has transitional vertabra at L6-S1.  The patient also has a history of  a herniated cervical disc in 2009 where he underwent laminectomy from C3-T7.  Today, the patient states he has decreased range of motion and remains on baclofen for intermittent neck pain.    The patient is a 72 y54r old Caucasian/White male who returns for a follow-up visit for his diabetes. This past year his HgBA1c was elevated and I wanted to start him on ozempic but he wanted to hold off and talk to his endocrinologist first.  He has seen his endo doctor in followup and they did not change his medications.  His  last saw endo as televisit in 12/2021.  Again, he was diagnosed with T2 diabetes around 2000. Since the last visit, there have been no problems. He remains on metformin 1064m BID and actos 121mdaily.  I tried him on farxiga 54m2maily but he could not afford this.  He is exercising by walking 1 miles per day.  He specifically denies unexplained abdominal pain, or documented hypoglycemia. He checks blood sugars at least 2-3 times per week and they tend to range somewhere between 100 and 140 mg/dl fasting. His last HgBA1c was 6.1 in 11/2021.  He came in fasting today in anticipation of lab work. 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  However he has had a laminectomy in the past which could contribute to this.  He remains on gabapentin 600m57mD for tingling and mild pain which has not worsened. He also has complications of diabetic CAD. There is no diabetic retinopathy or nephropathy.   The patient also has a history of major depression where in 11/2021 I increased his lexapro from 10mg14m20mg 30my.  Today, he states his depression is mild and controlled.  We had weaned him off wellbutryin in the past and started him on Lexapro.  He states his execise with walking helps him with sleeping.  His depression was effecting his ADL's, concentration, giving him worsened fatigue and feelings of worthlessness but again his Lexapro has helped.  He reports no additional symptoms He denies  extreme feelings of guilt, feelings of isolation, helpless feeling, suicidal ideation, homicidal ideation, weight gain, loss of appetite, social withdrawal, loss of interest in pleasurable activities, and out of control feelings. This patient feels that she is able to care for himself. He currently lives with his wife. He has no significant prior history of mental health disorders.   I did see Kristopher [name] in 11/2020 where he was having anemia associated with gross hematuria and nephrolithiasis.  I set him up for a CT urogram which was performed on 12/14/2020 and this demonstrated a 2.7cm nonobstructive calculus in his left renal pelvis.  There was no hydronephrosis or renal obstruction.  I did refer him to urology who took him to the OR where they placed a PCNL on 02/11/2021 and placed a ureteral stent at that time.  Three days later he began having difficulty with voiding where he saw urology and they placed an indwelling foley catheter at that time.  A few days later this clotted off and he went to the ER where they replaced his foley.  The following week on 02/23/2021 he was brought in by EMS for complaints of fevers, weakness and  vomiting.  The patient was discovered to have sepsis secondary to a UTI and was admitted to the ICU at that time.  His urinary organism was ESBL Klebsiella and Enterococcus where he also had ESBL Klebsiella bacteremia.  The patient never needed pressors but was placed on IVF's and antibiotics.  His hospital course was complicated by A. Fib with RVR where they started him on amiodarone.  Cardiology was consulted and an ECHO was performed which showed an EF 50-55% with moderate hypokinesis of the LV basal inferior wall and inferior septal wall.  They were able to get him off an amiodarone gtt and continued him on metoprol.  They did hold his Cardizem at discharge.  He was noted to have went into volume overload with acute heart failure exacerbation due to his fluids and A. Fib.  They also noted he went into acute hypoxic respiratory failure with ARDs.  The patient completed his course of antibiotics in the hospital and was weaned off oxygen.  He was noted to have right forearm pain and swelling where an [place] was negative for DVT however he did show a superficial cephalic vein thrombosis where they ordered warm compresses.   He has acute blood loss anemia secondary to hematuria and thrombocytopenia.  He was transfused 3 Units of blood with his discharge hemoglobin on 9.7.  His hematuria was resolved.  He also had acute metabolic encephalopathy in setting of acute illness and kidney injury where he had a CT of head and MRI of the brain which was unremarkable.  The patient spent 10 days in the hospital and was discharged on 03/04/2021.  HHe did followup with urology and had his ureteral stent removed. He saw urology in 04/14/2021 where they did a repeat US and he had no evidence of hydronephrosis. He return to urology in 07/2021 and underwent cystoscopy.  He had completed antifungals for fungus seen his a previous urine culture.  He was diagnosed with a Vitamin B12 defiency while in the hospital.  He does have a history of  chronic nausea where he has been seen at [place] [facility].  He has not started any new medications but remains on promethazine as needed which he uses about 3 times per week.  He also has a history of NASH cirrhosis and chronic gastritis where [facility] clinic has been following him.  They believe his cirrhosis is due to fatty liver disease where they have done lab testing and imaging.  They did an EGD in 01/2016 which showed a normal esophagus but he had a few small non-bleeding erosions in the gastric antrum and mild portal hypertensive gastropathy in the gastric body.  He is on protonix daily.  They are doing surveillance with EGD and testing for liver cancer.   This patient also has moderately severe CAD which was diagnosed around 1999 and presents today for a status visit. He did have a MI with a NSTEMI in 2015 where he has only had one MI in the past. He underwent a heart catherization at that time where he had PCI of the RCA with placement of a drug eluting stent. He remains on ASA 34m at this time. He did have a cardiac MRI done in 05/2018 which showed a basal mid-inferior wall and basal mid infero-septal wall which was mildly hypokinetic with a LVEF 55%. His LA was mildly enlarged. They noted a moderate sized pericardial effusion. His adenosine stress with the MRI showed no perfusion defects suggestive of inducible myocardial ischemia. See PMH for summary of cardiac history and status of revascularization. His CAD is controlled with therapy as summarized in the medication list and previous notes. The patient has the following comorbid condition(s): atrial fibrillation. He has no baseline symptoms of CAD. He has the following modifiable risk factor(s): DM and hyperlipidemia. Specifically denied complaint(s): chest pain, palpitations, orthopnea, edema, and exertional dyspnea. He did see cardiology in 2021 where he developed vasovagal syncope. He has seen EP where they placed an implanted loop recorder which he  states did not show any significant arrythema.  EP has told him to hydrate and keep off caffeine and he probably had dehydration/vasovagal syncope.   Kristopher EISENBEISis a 72y.o. male who is here for follow-up of syncope where [facility]  [facility] 02/12/2021.  He saw EP in 11/2021 where his loop recorder demonstrated a few episodes of AF, but no conduction disturbances, VHR events, or severe bradycardia. On the days that he had syncope, he had no abnormalities.  With this atrial arrhythmia, they noted a very low burden and he was asymptomatic. He is status post ablation 2001. He is not on antiarrhythmic drug. He was on DAPT. The patient tells me there was discussion with EP with GI and they stopped him off plavix and started him on eliquis 31m BID at that time.  He has not had any recent episodes of syncope. He remains on diltiazem XR 1249mevery 36 hours.  Today, he denies any chest pain, palpitations, SOB, edema or other problems.  He has a history of A. Fib in the past where he has had 2 ablations in 2001.  His last ECHO was done in 02/2021 and this showed a low normal function with a LVEF 50-55% with some regional wall motion abnormalities with mild LVH and Grade II diastolic dysfunction.  There is hypokinesis of the left ventricle wall, basal inferior wall and inferoseptal wall.  There was a 1.17 cm pericardial effusion without tamponade.    JaShah Insleyeturns today for routine followup on his cholesterol. Overall, he states he is doing well and is without any complaints or problems at this time. He specifically denies abdominal pain, vomiting, diarrhea, myalgias, and fatigue. He remains on dietary management as well as the following cholesterol lowering medications Co Q-10 150 mg oral capsule, niacin 500 mg oral tablet extended release, and rosuvastatin 5 mg oral tablet. He is fasting in anticipation for labs today.   He also states he had  [employer] amnesia in 2019 where he was going to [facility] how [facility] there.  He saw neurology where he had a CT and MRI (per patient) of his brain which was normal.  They just wanted to monitor.      Are there smokers in your home (other than you)? No  Risk Factors Current exercise habits:  as described   Dietary issues discussed: none   Depression Screen (Note: if answer to either of the following is "Yes", a more complete depression screening is indicated)   Over the past two weeks, have you felt down, depressed or hopeless? No  Over the past two weeks, have you felt little interest or pleasure in doing things? No  Have you lost interest or pleasure in daily life? No  Do you often feel hopeless? No  Do you cry easily over simple problems? No  Activities of Daily Living In your present state of health, do you have any difficulty performing the following activities?:  Driving? No Managing money?  No Feeding yourself? No Getting from bed to chair? No Climbing a flight of stairs? No Preparing food and eating?: No Bathing or showering? No Getting dressed: No Getting to the toilet? No Using the toilet:No Moving around from place to place: No In the past year have you fallen or had a near fall?:No   Are you sexually active?  Yes  Do you have more than one partner?  No  Hearing Difficulties: Yes Do you often ask people to speak up or repeat themselves? Yes Do you experience ringing or noises in your ears? No Do you have difficulty understanding soft or whispered voices? Yes   Do you feel that you have a problem with memory? Yes  Do you often misplace items? Yes  Do you feel safe at home?  Yes  Cognitive Testing  Alert? Yes  Normal Appearance?Yes  Oriented to person? Yes  Place? Yes   Time? Yes  Recall of three objects?  Yes  Can perform simple calculations? Yes  Displays appropriate judgment?Yes  Can read the correct time from  a watch face?Yes  Fall Risk Prevention  Any stairs in or around the home? No  If so, are there any without handrails? No  Home free of loose throw rugs in walkways, pet beds, electrical cords, etc? No  Adequate lighting in your home to reduce risk of falls? Yes  Use of a cane, walker or w/c? No    Time Up and Go  Was the test performed? Yes .  Length of time to ambulate 10 feet: 5 sec.   Gait steady and fast without use of assistive device    Advanced Directives have been discussed with the patient? Yes   List the Names of Other Physician/Practitioners you currently use: Patient Care Team: Townsend Roger, MD as PCP - General (Internal Medicine)    Past Medical History:  Diagnosis Date   Anemia 12/2020   Aortic atherosclerosis [facility])    Atrial fibrillation ([place])    a. s/p PVI X2 by Dr Omelia Blackwater at [facility] around 2000.   CAD (coronary artery disease)    a. NSTEMI (06/2013):  LHC (06/27/2013):  pLAD 10-20, RCA 90, EF 45-50%, inf HK.  PCI:  Xience Alpine (3 x 23 mm) DES to the RCA. b. Relook cath 07/10/13 - patent stent. Imdur added. c. Recurrent CP after that - abnormal nuc 07/2017 with chest pain, dyspnea, hypotensive response. Rest images with mid-basal inferior thinning. No stress images due to abrupt d/c of test. Relook cath 3/12 - no significant r   Chronic back pain greater than 3 months duration    Cirrhosis, non-alcoholic (HCC)    Depression    DJD (degenerative joint disease) of cervical spine    "3 herniated discs" (09/01/2013)   DJD (degenerative joint disease), lumbosacral    "bulging L5-S1" (09/01/2013)   Dysrhythmia    PVCs. A.fib with two ablations   Herniated cervical disc    c2-T1 fusion   History of kidney stones    turned into urosepsis   HLD (hyperlipidemia)    Ischemic cardiomyopathy    a. Echo (06/28/2011): Inferior and inferoseptal akinesis, EF 53%, grade 2 diastolic dysfunction, MAC, small effusion (no tamponade). B. EF 40% by echo 06/2013, cath 07/2013, 55% by  cath 08/2013 with subtle mild mid inferior hypocontractility.   Liver cirrhosis secondary to NASH [facility])    Lumbar herniated disc    Neuromuscular disorder ([place])    bilateral neuropathy legs   NSTEMI (non-ST elevated myocardial infarction) ([place]) 06/27/2013   Osteoarthritis    "neck and lower back" (09/01/2013)   Pericardial effusion    a. Chronic since hx of pericarditis in 2004. b. Mod by echo 06/2013, small by echo 06/2013.   Restless leg syndrome    Type II diabetes mellitus ([place])     Past Surgical History:  Procedure Laterality Date   APPENDECTOMY  ~ [place]  06/19/1998   Dr Omelia Blackwater at [place]  06/15/2022   Procedure: BIOPSY;  Surgeon: Daryel November, MD;  Location: [facility];  Service: Gastroenterology;;   CARDIAC CATHETERIZATION     COLONOSCOPY WITH PROPOFOL N/A 06/15/2022   Procedure: COLONOSCOPY WITH PROPOFOL;  Surgeon: Daryel November, MD;  Location: [facility];  Service: Gastroenterology;  Laterality: N/A;   CORONARY ANGIOPLASTY     CYSTOSCOPY WITH RETROGRADE URETHROGRAM N/A 07/21/2021   Procedure: CYSTOSCOPY WITH URETHRAL DILATION;  Surgeon: [name], MD;  Location: [facility];  Service: Urology;  Laterality: N/A;  6 MINS   ESOPHAGOGASTRODUODENOSCOPY (EGD) WITH PROPOFOL N/A 06/15/2022   Procedure: ESOPHAGOGASTRODUODENOSCOPY (EGD) WITH PROPOFOL;  Surgeon: Daryel November, MD;  Location: [facility];  Service: Gastroenterology;  Laterality: N/A;   LEFT AND RIGHT HEART CATHETERIZATION WITH CORONARY ANGIOGRAM N/A 10/07/2013   Procedure: LEFT AND RIGHT HEART CATHETERIZATION WITH CORONARY ANGIOGRAM;  Surgeon: Troy Sine, MD;  Location: [facility] CATH LAB;  Service: Cardiovascular;  Laterality: N/A;   LEFT HEART CATHETERIZATION WITH CORONARY ANGIOGRAM N/A 06/27/2013   Procedure: LEFT HEART CATHETERIZATION WITH CORONARY ANGIOGRAM;  Surgeon: Troy Sine, MD;  Location: [facility] CATH LAB;  Service: Cardiovascular;  Laterality: N/A;    LEFT HEART CATHETERIZATION WITH CORONARY ANGIOGRAM N/A 07/10/2013   Procedure: LEFT HEART CATHETERIZATION WITH CORONARY ANGIOGRAM;  Surgeon: Burnell Blanks, MD;  Location: [facility] CATH LAB;  Service: Cardiovascular;  Laterality: N/A;   LEFT HEART CATHETERIZATION WITH CORONARY ANGIOGRAM N/A 08/28/2013   Procedure: LEFT HEART CATHETERIZATION WITH CORONARY ANGIOGRAM;  Surgeon: Troy Sine, MD;  Location: [facility] CATH LAB;  Service: Cardiovascular;  Laterality: N/A;   NEPHROLITHOTOMY Left 02/11/2021   Procedure: LEFT NEPHROLITHOTOMY PERCUTANEOUS WITH SURGEON ACCESS;  Surgeon: [name], MD;  Location: [facility];  Service: Urology;  Laterality: Left;   PERCUTANEOUS CORONARY STENT INTERVENTION (PCI-S)  06/27/2013   Procedure: PERCUTANEOUS CORONARY STENT INTERVENTION (PCI-S);  Surgeon: Troy Sine, MD;  Location: [facility] CATH LAB;  Service: Cardiovascular;;   POLYPECTOMY  06/15/2022   Procedure: POLYPECTOMY;  Surgeon: Daryel November, MD;  Location: [facility];  Service: Gastroenterology;;   POSTERIOR CERVICAL FUSION/FORAMINOTOMY  06/20/2007   fusion c3-t1   PULMONARY VEIN ABLATION  06/20/1999   Dr Omelia Blackwater at [place]  06/19/1955   TRANSFORAMINAL LUMBAR INTERBODY FUSION W/ MIS 1 LEVEL Left 12/08/2021   Procedure: LEFT LUMBAR FIVE-SACRAL ONE MINIMALLY INVASIVE (MIS) TRANSFORAMINAL LUMBAR INTERBODY FUSION;  Surgeon: Judith Part, MD;  Location: [facility];  Service: Neurosurgery;  Laterality: Left;      Current Medications  Current Outpatient Medications  Medication Sig Dispense Refill   pneumococcal 13-valent conjugate vaccine (PREVNAR 13) SUSP injection Inject 0.5 mLs into the muscle tomorrow at 10 am for 1 dose. 0.5 mL 0   Zoster Vaccine Adjuvanted [facility]) injection Inject 0.5 mLs into the muscle once for 1 dose. 0.5 mL 0   aspirin EC 81 MG tablet Take 81 mg by mouth every evening.      baclofen (LIORESAL) 10 MG tablet Take 10 mg by mouth 3 (three) times daily as  needed for muscle spasms.     cetirizine (ZYRTEC) 10 MG tablet Take 10 mg by mouth daily as needed for allergies.     diltiazem (CARDIZEM SR) 120 MG 12 hr capsule Take 120 mg by mouth every 36 (thirty-six) hours.     ELIQUIS 5 MG TABS tablet Take 5 mg by mouth 2 (two) times daily.     escitalopram (LEXAPRO) 20 MG tablet Take 20 mg by mouth daily.     ferrous sulfate 325 (65 FE) MG tablet Take 325 mg by mouth 2 (two) times daily.     gabapentin (NEURONTIN) 600 MG tablet Take 600 mg by mouth 3 (three) times  daily.     ibuprofen (ADVIL) 200 MG tablet Take 800 mg by mouth daily as needed for mild pain.     metFORMIN (GLUCOPHAGE-XR) 500 MG 24 hr tablet Take 1,000 mg by mouth 2 (two) times daily.     Misc Natural Products (PUMPKIN SEED OIL) CAPS Take 1 capsule by mouth daily. 3000 mg     Multiple Vitamins-Iron (MULTIVITAMINS WITH IRON) TABS tablet Take 1 tablet by mouth daily. (Patient not taking: Reported on 06/13/2022) 30 tablet 0   niacin 500 MG tablet Take 500 mg by mouth at bedtime.     pantoprazole (PROTONIX) 40 MG tablet Take 1 tablet (40 mg total) by mouth daily. 90 tablet 1   pioglitazone (ACTOS) 15 MG tablet Take 15 mg by mouth daily.     promethazine (PHENERGAN) 25 MG tablet Take 25 mg by mouth every 6 (six) hours as needed for nausea/vomiting.     Pseudoeph-Doxylamine-DM-APAP (NYQUIL PO) Take 1 Dose by mouth daily as needed (cold symptoms).     rOPINIRole (REQUIP) 2 MG tablet Take 1 tablet (2 mg total) by mouth at bedtime. 90 tablet 1   rosuvastatin (CRESTOR) 5 MG tablet Take 5 mg by mouth every evening.     Saw Palmetto 450 MG CAPS Take 450 mg by mouth at bedtime.     tamsulosin (FLOMAX) 0.4 MG CAPS capsule Take 0.4 mg by mouth at bedtime.     No current facility-administered medications for this visit.    Allergies Codeine, Duragesic-100 [fentanyl], Hydrocodone-acetaminophen, Naprosyn [naproxen], and Other   Social History Social History   Tobacco Use   Smoking status: Former     Packs/day: 1.50    Years: 43.00    Total pack years: 64.50    Types: Cigarettes    Quit date: 05/06/2007    Years since quitting: 15.1   Smokeless tobacco: Never  Substance Use Topics   Alcohol use: Yes    Comment: once monthly/rarely     Review of Systems Review of Systems  Constitutional:  Negative for chills, fever, malaise/fatigue and weight loss.  HENT:  Positive for hearing loss. Negative for tinnitus.   Eyes:  Negative for blurred vision and double vision.  Respiratory:  Negative for cough, hemoptysis, shortness of breath and wheezing.   Cardiovascular:  Negative for chest pain, palpitations, orthopnea and leg swelling.  Gastrointestinal:  Negative for abdominal pain, blood in stool, constipation, diarrhea, heartburn, melena, nausea and vomiting.  Genitourinary:  Negative for frequency and hematuria.  Musculoskeletal:  Negative for back pain and myalgias.  Skin:  Negative for itching and rash.  Neurological:  Negative for dizziness, seizures and headaches.  Psychiatric/Behavioral:  Negative for depression. The patient is not nervous/anxious and does not have insomnia.      Physical Exam:      Body mass index is 25.37 kg/m. BP 118/68   Pulse 75   Temp (!) 97.4 F (36.3 C)   Resp 16   Ht _0  (1.702 m)   Wt 162 lb (73.5 kg)   SpO2 99%   BMI 25.37 kg/m   Physical Exam Constitutional:      Appearance: Normal appearance. He is not ill-appearing.  HENT:     Head: Normocephalic and atraumatic.     Right Ear: Tympanic membrane, ear canal and external ear normal.     Left Ear: Tympanic membrane, ear canal and external ear normal.     Nose: Nose normal. No congestion or rhinorrhea.     Mouth/Throat:  Mouth: Mucous membranes are moist.     Pharynx: Oropharynx is clear. No oropharyngeal exudate or posterior oropharyngeal erythema.  Eyes:     General: No scleral icterus.    Conjunctiva/sclera: Conjunctivae normal.     Pupils: Pupils are equal, round, and  reactive to light.  Neck:     Vascular: No carotid bruit.  Cardiovascular:     Rate and Rhythm: Normal rate and regular rhythm.     Pulses: Normal pulses.     Heart sounds: No murmur heard.    No friction rub. No gallop.  Pulmonary:     Effort: Pulmonary effort is normal. No respiratory distress.     Breath sounds: No wheezing, rhonchi or rales.  Abdominal:     General: Abdomen is flat. Bowel sounds are normal. There is no distension.     Palpations: Abdomen is soft.     Tenderness: There is no abdominal tenderness.  Musculoskeletal:     Cervical back: Normal range of motion and neck supple. No tenderness.     Right lower leg: No edema.     Left lower leg: No edema.  Lymphadenopathy:     Cervical: No cervical adenopathy.  Skin:    General: Skin is warm and dry.     Findings: No rash.  Neurological:     General: No focal deficit present.     Mental Status: He is alert and oriented to person, place, and time.  Psychiatric:        Mood and Affect: Mood normal.        Behavior: Behavior normal.      Assessment:      Diabetic polyneuropathy associated with type 2 diabetes mellitus (HCC)  Type 2 diabetes mellitus with other circulatory complication, without long-term current use of insulin (HCC) - Plan: Zoster Vaccine Adjuvanted [facility]) injection, pneumococcal 13-valent conjugate vaccine (PREVNAR 13) SUSP injection, CBC with Differential/Platelet, CMP14 + Anion Gap, Hemoglobin A1c, Lipid panel, Microalbumin / creatinine urine ratio, TSH, Iron, Ferritin, Vitamin B12  Atrial arrhythmia  Paroxysmal atrial fibrillation (HCC)  Aortic atherosclerosis (HCC)  Coronary artery disease involving native coronary artery of native heart without angina pectoris  Other cirrhosis of liver (HCC)  Mild episode of recurrent major depressive disorder (HCC)  Restless legs syndrome  Herniated disc, cervical  Lumbar herniated disc  Iron deficiency anemia, unspecified iron deficiency  anemia type - Plan: Iron, Ferritin, Vitamin B12  Vitamin B12 deficiency - Plan: Vitamin B12  Hypercholesterolemia  BMI 25.0-25.9,adult  Urinary hesitancy - Plan: PSA  Atrial fibrillation with RVR (HCC)    Plan:     During the course of the visit the patient was educated and counseled about appropriate screening and preventive services including:   Pneumococcal vaccine  Influenza vaccine Colorectal cancer screening Diabetes screening  Diet review for nutrition referral? Yes ____  Not Indicated __X__   Patient Instructions (the written plan) was given to the patient.  Coronary artery disease involving native coronary artery of native heart without angina pectoris I have reviewed his notes from cardiogy.  He remains on an ASA as well as a statin.  He denies any angina today.  We will continue with risk factor modifications.  Atrial fibrillation with RVR ([place]) He is s/p ablation and on exam today he seems to be in sinus rhythm.  He will continue with his current meds.  Atrial arrhythmia He did see EP in 11/2021 at [facility].  They switched him to eliquis at that time.  He remains  on diltiazem XR.  Aortic atherosclerosis ([place]) Continue with healthy eating, exercise and his current statin use.  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  Again, continue lifestyle modifcation.  He has not evidence of ascites or volume overload today.  Diabetic polyneuropathy associated with type 2 diabetes mellitus ([place]) He has diabetes with neuropathy and complications of CAD.  His goal A1c <7%.  His diabetic foot exam was normal today.  We will send for diabetic labs.  Lumbar herniated disc He is s/p surgery this year and is now doing better with his pain.  He is done with PT.  Herniated disc, cervical I want him to remain active with exercise with daily walkiing.  Vitamin B12 deficiency We will check his Vit B12 levels today.  Restless legs  syndrome His RLS is improved with his current dose of requip.  Iron deficiency anemia, unspecified He had recent endoscopy done which showed no evidence of bleeding.  They are planning a capsule endoscopy.  I will recheck his blood counts and iron studies today.  Iron (Fe) deficiency anemia We will check ferritin and iron levels as he has had mulitiple iron infusions this past year.  Hypercholesterolemia Continue on his statin with goal LDL <50.  Depression His depression is mild major recurrent depression that is controlled.  We will continue to monitor.  BMI 25.0-25.9,adult I want him to eat healthy and continue to do daily exercise.  Prevention Health maintenance discussed.  We will obtain some yearly labs.  We will send scripts for shingrix and prevnar 13.  RSV vaccine was discussed.   Medicare Attestation I have personally reviewed: The patient's medical and social history Their use of alcohol, tobacco or illicit drugs Their current medications and supplements The patient's functional ability including ADLs,fall risks, home safety risks, cognitive, and hearing and visual impairment Diet and physical activities Evidence for depression or mood disorders  The patient's weight, height, and BMI have been recorded in the chart.  I have made referrals, counseling, and provided education to the patient based on review of the above and I have provided the patient with a written personalized care plan for preventive services.     Townsend Roger, MD   06/30/2022

## 2022-06-30 NOTE — Assessment & Plan Note (Signed)
He had recent endoscopy done which showed no evidence of bleeding.  They are planning a capsule endoscopy.  I will recheck his blood counts and iron studies today.

## 2022-06-30 NOTE — Assessment & Plan Note (Signed)
I have reviewed his notes from cardiogy.  He remains on an ASA as well as a statin.  He denies any angina today.  We will continue with risk factor modifications.

## 2022-06-30 NOTE — Assessment & Plan Note (Signed)
Continue with healthy eating, exercise and his current statin use.

## 2022-06-30 NOTE — Assessment & Plan Note (Signed)
I want him to eat healthy and continue to do daily exercise.

## 2022-06-30 NOTE — Assessment & Plan Note (Signed)
Continue on his statin with goal LDL <50.

## 2022-06-30 NOTE — Assessment & Plan Note (Signed)
He is s/p ablation and on exam today he seems to be in sinus rhythm.  He will continue with his current meds.

## 2022-06-30 NOTE — Assessment & Plan Note (Signed)
He is s/p surgery this year and is now doing better with his pain.  He is done with PT.

## 2022-06-30 NOTE — Assessment & Plan Note (Signed)
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  Again, continue lifestyle modifcation.  He has not evidence of ascites or volume overload today.

## 2022-07-01 LAB — CMP14 + ANION GAP
ALT: 12 IU/L (ref 0–44)
AST: 14 IU/L (ref 0–40)
Albumin/Globulin Ratio: 2.3 — ABNORMAL HIGH (ref 1.2–2.2)
Albumin: 4.3 g/dL (ref 3.9–4.9)
Alkaline Phosphatase: 75 IU/L (ref 44–121)
Anion Gap: 13 mmol/L (ref 10.0–18.0)
BUN/Creatinine Ratio: 21 (ref 10–24)
BUN: 19 mg/dL (ref 8–27)
Bilirubin Total: 0.5 mg/dL (ref 0.0–1.2)
CO2: 24 mmol/L (ref 20–29)
Calcium: 9 mg/dL (ref 8.6–10.2)
Chloride: 103 mmol/L (ref 96–106)
Creatinine, Ser: 0.92 mg/dL (ref 0.76–1.27)
Globulin, Total: 1.9 g/dL (ref 1.5–4.5)
Glucose: 123 mg/dL — ABNORMAL HIGH (ref 70–99)
Potassium: 4.2 mmol/L (ref 3.5–5.2)
Sodium: 140 mmol/L (ref 134–144)
Total Protein: 6.2 g/dL (ref 6.0–8.5)
eGFR: 90 mL/min/{1.73_m2} (ref >59)

## 2022-07-01 LAB — CBC WITH DIFFERENTIAL/PLATELET
Basophils Absolute: 0 10*3/uL (ref 0.0–0.2)
Basos: 0 %
EOS (ABSOLUTE): 0.1 10*3/uL (ref 0.0–0.4)
Eos: 2 %
Hematocrit: 33.7 % — ABNORMAL LOW (ref 37.5–51.0)
Hemoglobin: 11.4 g/dL — ABNORMAL LOW (ref 13.0–17.7)
Immature Grans (Abs): 0 10*3/uL (ref 0.0–0.1)
Immature Granulocytes: 0 %
Lymphocytes Absolute: 0.9 10*3/uL (ref 0.7–3.1)
Lymphs: 18 %
MCH: 33.4 pg — ABNORMAL HIGH (ref 26.6–33.0)
MCHC: 33.8 g/dL (ref 31.5–35.7)
MCV: 99 fL — ABNORMAL HIGH (ref 79–97)
Monocytes Absolute: 0.3 10*3/uL (ref 0.1–0.9)
Monocytes: 6 %
Neutrophils Absolute: 3.6 10*3/uL (ref 1.4–7.0)
Neutrophils: 74 %
Platelets: 140 10*3/uL — ABNORMAL LOW (ref 150–450)
RBC: 3.41 x10E6/uL — ABNORMAL LOW (ref 4.14–5.80)
RDW: 13.4 % (ref 11.6–15.4)
WBC: 4.8 10*3/uL (ref 3.4–10.8)

## 2022-07-01 LAB — TSH: TSH: 1.74 u[IU]/mL (ref 0.450–4.500)

## 2022-07-01 LAB — LIPID PANEL
Chol/HDL Ratio: 3.1 ratio (ref 0.0–5.0)
Cholesterol, Total: 135 mg/dL (ref 100–199)
HDL: 44 mg/dL (ref >39)
LDL Chol Calc (NIH): 71 mg/dL (ref 0–99)
Triglycerides: 112 mg/dL (ref 0–149)
VLDL Cholesterol Cal: 20 mg/dL (ref 5–40)

## 2022-07-01 LAB — HEMOGLOBIN A1C
Est. average glucose Bld gHb Est-mCnc: 126 mg/dL
Hgb A1c MFr Bld: 6 % — ABNORMAL HIGH (ref 4.8–5.6)

## 2022-07-01 LAB — PSA: Prostate Specific Ag, Serum: 1 ng/mL (ref 0.0–4.0)

## 2022-07-01 LAB — MICROALBUMIN / CREATININE URINE RATIO
Creatinine, Urine: 89.1 mg/dL
Microalb/Creat Ratio: 16 mg/g creat (ref 0–29)
Microalbumin, Urine: 14.7 ug/mL

## 2022-07-01 LAB — VITAMIN B12: Vitamin B-12: 273 pg/mL (ref 232–1245)

## 2022-07-01 LAB — IRON: Iron: 60 ug/dL (ref 38–169)

## 2022-07-01 LAB — FERRITIN: Ferritin: 257 ng/mL (ref 30–400)

## 2022-07-12 DIAGNOSIS — Z7984 Long term (current) use of oral hypoglycemic drugs: Secondary | ICD-10-CM | POA: Diagnosis not present

## 2022-07-12 DIAGNOSIS — E119 Type 2 diabetes mellitus without complications: Secondary | ICD-10-CM | POA: Diagnosis not present

## 2022-07-16 ENCOUNTER — Telehealth: Payer: Self-pay | Admitting: Gastroenterology

## 2022-07-16 NOTE — Telephone Encounter (Signed)
Patient called stating that we never called in his prep for his small bowel capsule endoscopy to his local pharmacy.  Patient was advised that it is just over-the-counter MiraLAX.  Looks like his instructions were sent to him via MyChart, but it appears that he has not read the message.  He states that he was in his MyChart and he did not see message with those instructions.  He has been taking his iron up until today.  I have advised him that I would have the nursing staff reach out to him to reschedule his capsule study and be sure he gets his instructions.

## 2022-07-17 ENCOUNTER — Ambulatory Visit: Payer: Medicare HMO | Admitting: Internal Medicine

## 2022-07-17 ENCOUNTER — Other Ambulatory Visit: Payer: Self-pay

## 2022-07-17 ENCOUNTER — Encounter: Payer: Self-pay | Admitting: Internal Medicine

## 2022-07-17 VITALS — BP 120/70 | HR 96 | Temp 97.2°F | Resp 16 | Ht 65.0 in | Wt 165.6 lb

## 2022-07-17 DIAGNOSIS — R062 Wheezing: Secondary | ICD-10-CM | POA: Diagnosis not present

## 2022-07-17 DIAGNOSIS — R059 Cough, unspecified: Secondary | ICD-10-CM | POA: Diagnosis not present

## 2022-07-17 DIAGNOSIS — I4891 Unspecified atrial fibrillation: Secondary | ICD-10-CM | POA: Insufficient documentation

## 2022-07-17 MED ORDER — PROMETHAZINE HCL 25 MG PO TABS: 25.0000 mg | ORAL_TABLET | Freq: Four times a day (QID) | ORAL | 5 refills | Status: DC | PRN

## 2022-07-17 MED ORDER — GABAPENTIN 600 MG PO TABS: 600.0000 mg | ORAL_TABLET | Freq: Three times a day (TID) | ORAL | 5 refills | Status: DC

## 2022-07-17 NOTE — Progress Notes (Signed)
Patient called.  Unable to reach patient. Will mail letter.   His labs look good.

## 2022-07-17 NOTE — Telephone Encounter (Signed)
[name] see the note per Janett Billow for Dr Candis Schatz pt.

## 2022-07-17 NOTE — Assessment & Plan Note (Signed)
He states he has these intermittient periods of desats and can wheeze at times.  On exam today, his only abnormality is his irregular irergular rhythm with his A. Fib that seems rate controlled.  His lungs are clear and his oxygen sats are normal.  We will obtain an EKG.  He just had labs done <2 weeks ago and they were near normal.  He may need an ECHO to be repeated but we will get a CXR first.

## 2022-07-17 NOTE — Progress Notes (Signed)
Will mail letter.  

## 2022-07-17 NOTE — Telephone Encounter (Signed)
Left message for pt to call back  °

## 2022-07-17 NOTE — Progress Notes (Signed)
Office Visit  Subjective   Patient ID: Kristopher [name]   DOB: June 13, 1951   Age: 72 y.o.   MRN: [id number]   Chief Complaint Chief Complaint  Patient presents with   Acute Visit    Low O2 Sats     History of Present Illness The patient is a 72 year old male who presents with upper respiratory tract symptoms which began about 1 month ago.  He states a month ago he began having sinus congestion with clear nasal discharge with post nasal drip as well as chest congestion with non-productive cough.  Over the last month, his symptoms have remained the same and has not improved.  I was called last week by his wife where his oxygen sats were in the 80's but worsened when he would lay down.  They also noted he was wheezing when lying down as well and he has had extreme itching over the last month.  He does not have a history of asthma or COPD but he does have a history of CAD and A. Fib.  He denies any orthopnea or lower extremity edema nor any chest pain.  He gets intermitent heart palpitations.  Today, he denies any fevers, chills, SOB, sore throat, body aches,  headache,  nausea, vomiting. Alleviating factors: nyquil. The patient states these desaturation are intermittent.  He still walks down the road for about 3 miles every day and on same days his oxygen sats will go to low 90's.      Past Medical History Past Medical History:  Diagnosis Date   Anemia 12/2020   Aortic atherosclerosis (HCC)    Atrial fibrillation ([place])    a. s/p PVI X2 by Dr Omelia Blackwater at [facility] around 2000.   CAD (coronary artery disease)    a. NSTEMI (06/2013):  LHC (06/27/2013):  pLAD 10-20, RCA 90, EF 45-50%, inf HK.  PCI:  Xience Alpine (3 x 23 mm) DES to the RCA. b. Relook cath 07/10/13 - patent stent. Imdur added. c. Recurrent CP after that - abnormal nuc 07/2017 with chest pain, dyspnea, hypotensive response. Rest images with mid-basal inferior thinning. No stress images due to abrupt d/c of test. Relook cath 3/12 - no significant r    Chronic back pain greater than 3 months duration    Cirrhosis, non-alcoholic (HCC)    Depression    DJD (degenerative joint disease) of cervical spine    "3 herniated discs" (09/01/2013)   DJD (degenerative joint disease), lumbosacral    "bulging L5-S1" (09/01/2013)   Dysrhythmia    PVCs. A.fib with two ablations   Herniated cervical disc    c2-T1 fusion   History of kidney stones    turned into urosepsis   HLD (hyperlipidemia)    Ischemic cardiomyopathy    a. Echo (06/28/2011): Inferior and inferoseptal akinesis, EF 41%, grade 2 diastolic dysfunction, MAC, small effusion (no tamponade). B. EF 40% by echo 06/2013, cath 07/2013, 55% by cath 08/2013 with subtle mild mid inferior hypocontractility.   Liver cirrhosis secondary to NASH [facility])    Lumbar herniated disc    Neuromuscular disorder ([place])    bilateral neuropathy legs   NSTEMI (non-ST elevated myocardial infarction) ([place]) 06/27/2013   Osteoarthritis    "neck and lower back" (09/01/2013)   Pericardial effusion    a. Chronic since hx of pericarditis in 2004. b. Mod by echo 06/2013, small by echo 06/2013.   Restless leg syndrome    Type II diabetes mellitus ([place])  Allergies Allergies  Allergen Reactions   Codeine Nausea And Vomiting   Duragesic-100 [Fentanyl] Nausea And Vomiting   Hydrocodone-Acetaminophen Nausea And Vomiting   Naprosyn [Naproxen] Nausea Only   Other Nausea And Vomiting    1. N/V to all opiates. He is able to take versed. (noted 09/29/13) 2.The patient states that he has had  a reaction to all narcotics. He can take them though. (noted 06/26/13)     Medications  Current Outpatient Medications:    aspirin EC 81 MG tablet, Take 81 mg by mouth every evening. , Disp: , Rfl:    baclofen (LIORESAL) 10 MG tablet, Take 10 mg by mouth 3 (three) times daily as needed for muscle spasms., Disp: , Rfl:    cetirizine (ZYRTEC) 10 MG tablet, Take 10 mg by mouth daily as needed for allergies., Disp: , Rfl:    diltiazem  (CARDIZEM SR) 120 MG 12 hr capsule, Take 120 mg by mouth every 36 (thirty-six) hours., Disp: , Rfl:    ELIQUIS 5 MG TABS tablet, Take 5 mg by mouth 2 (two) times daily., Disp: , Rfl:    escitalopram (LEXAPRO) 20 MG tablet, Take 20 mg by mouth daily., Disp: , Rfl:    ferrous sulfate 325 (65 FE) MG tablet, Take 325 mg by mouth 2 (two) times daily., Disp: , Rfl:    gabapentin (NEURONTIN) 600 MG tablet, Take 600 mg by mouth 3 (three) times daily., Disp: , Rfl:    ibuprofen (ADVIL) 200 MG tablet, Take 800 mg by mouth daily as needed for mild pain., Disp: , Rfl:    metFORMIN (GLUCOPHAGE-XR) 500 MG 24 hr tablet, Take 1,000 mg by mouth 2 (two) times daily., Disp: , Rfl:    Misc Natural Products (PUMPKIN SEED OIL) CAPS, Take 1 capsule by mouth daily. 3000 mg, Disp: , Rfl:    Multiple Vitamins-Iron (MULTIVITAMINS WITH IRON) TABS tablet, Take 1 tablet by mouth daily. (Patient not taking: Reported on 06/13/2022), Disp: 30 tablet, Rfl: 0   niacin 500 MG tablet, Take 500 mg by mouth at bedtime., Disp: , Rfl:    pantoprazole (PROTONIX) 40 MG tablet, Take 1 tablet (40 mg total) by mouth daily., Disp: 90 tablet, Rfl: 1   pioglitazone (ACTOS) 15 MG tablet, Take 15 mg by mouth daily., Disp: , Rfl:    promethazine (PHENERGAN) 25 MG tablet, Take 25 mg by mouth every 6 (six) hours as needed for nausea/vomiting., Disp: , Rfl:    Pseudoeph-Doxylamine-DM-APAP (NYQUIL PO), Take 1 Dose by mouth daily as needed (cold symptoms)., Disp: , Rfl:    rOPINIRole (REQUIP) 2 MG tablet, Take 1 tablet (2 mg total) by mouth at bedtime., Disp: 90 tablet, Rfl: 1   rosuvastatin (CRESTOR) 5 MG tablet, Take 5 mg by mouth every evening., Disp: , Rfl:    Saw Palmetto 450 MG CAPS, Take 450 mg by mouth at bedtime., Disp: , Rfl:    tamsulosin (FLOMAX) 0.4 MG CAPS capsule, Take 0.4 mg by mouth at bedtime., Disp: , Rfl:    Review of Systems Review of Systems  Constitutional:  Negative for chills, diaphoresis and fever.  Eyes:  Negative for  blurred vision and double vision.  Respiratory:  Positive for cough and wheezing. Negative for hemoptysis, sputum production and shortness of breath.   Cardiovascular:  Positive for palpitations. Negative for chest pain, orthopnea and leg swelling.  Gastrointestinal:  Positive for diarrhea. Negative for abdominal pain, constipation, nausea and vomiting.  Musculoskeletal:  Negative for myalgias.  Skin:  Negative  for itching and rash.  Neurological:  Negative for dizziness, weakness and headaches.       Objective:    Vitals BP 120/70   Pulse 96   Temp (!) 97.2 F (36.2 C)   Resp 16   Ht 5' 5"  (1.651 m)   Wt 165 lb 9.6 oz (75.1 kg)   SpO2 94%   BMI 27.56 kg/m    Physical Examination Physical Exam Constitutional:      Appearance: Normal appearance. He is not ill-appearing.  Cardiovascular:     Rate and Rhythm: Normal rate and regular rhythm.     Pulses: Normal pulses.     Heart sounds: No murmur heard.    No friction rub. No gallop.  Pulmonary:     Effort: Pulmonary effort is normal. No respiratory distress.     Breath sounds: No wheezing, rhonchi or rales.  Abdominal:     General: Abdomen is flat. Bowel sounds are normal. There is no distension.     Palpations: Abdomen is soft.     Tenderness: There is no abdominal tenderness.  Musculoskeletal:     Right lower leg: No edema.     Left lower leg: No edema.  Skin:    General: Skin is warm and dry.     Findings: No rash.  Neurological:     Mental Status: He is alert.        Assessment & Plan:   Wheezing He states he has these intermittient periods of desats and can wheeze at times.  On exam today, his only abnormality is his irregular irergular rhythm with his A. Fib that seems rate controlled.  His lungs are clear and his oxygen sats are normal.  We will obtain an EKG.  He just had labs done <2 weeks ago and they were near normal.  He may need an ECHO to be repeated but we will get a CXR first.    No follow-ups on  file.   Townsend Roger, MD

## 2022-07-19 NOTE — Telephone Encounter (Signed)
Pts VCE rescheduled to 07/28/22 at 8:30am. Confirmed with pt that he does have the instructions that were previously sent via mychart. He is aware of appt.

## 2022-07-20 ENCOUNTER — Telehealth: Payer: Self-pay

## 2022-07-20 NOTE — Progress Notes (Signed)
Patient is scheduled for CMCS appointment with Sushama on 07/28/2022'@9'$ :00am

## 2022-07-21 ENCOUNTER — Other Ambulatory Visit: Payer: Self-pay | Admitting: Internal Medicine

## 2022-07-26 ENCOUNTER — Telehealth: Payer: Self-pay

## 2022-07-26 NOTE — Progress Notes (Signed)
CMCS Initial Chart Prep  Start: 07/26/22, 10:05 AM. Reviewed chart/consult notes/labs/vitals/medications/documents/TEs in [place] and EPIC to complete chart prep for pt's initial CMCS Televisit. Pt is scheduled to speak with Jerral Ralph, PharmD on 07/28/22. Total time: 76 minutes, 18 seconds (non-billable time). - JHM, LPN.  CMCS Disenrollment  Start: 07/27/22. Called pt to remind him of his appt date/time/format. I discovered that his appt was no longer listed on the EPIC schedule. Pt stated, "I cancelled [with the office] because I'm scheduled for an endoscopy in the morning. I'm not sure that I'm interested in doing this; are you working for Dr. Nona Dell, or is this one of his subsidiaries that I've encountered before? My drugs have been very specific and fine-tuned over the past 2 years. My Cardizem is every 36 hours now."  We discussed CMCS/CCM services at length, and the different resources and benefits that would be available to him in the program. I clarified that medication changes would not be recommended unless his providers believed that it was medically necessary. I explained that any changes would not be made without his consent, as well. I also clarified that he was on a list of patients that was recommended by Dr. Nona Dell, but confirmed that he is not required to participate in the [facility] program. Pt stated, "I don't really think I'm interested. I'm retired Government social research officer, and my wife is active medical. Marya Amsler both respiratory therapists. My wife is a Engineer, drilling on me, as far as my health [is concerned]. She or I will see it if anything is going on with any of my meds. If it does happen, I'm not shy about calling the doctor's office. I think I'm declining for now."  Proceeded with disenrollment protocol. Per [employer], I added pt to our Do Not Call list. Pt will need to notify his PCP if he would like to re-enroll in the [facility] program. - Frazier Butt, LPN.

## 2022-07-28 ENCOUNTER — Ambulatory Visit: Payer: Medicare HMO

## 2022-07-28 ENCOUNTER — Ambulatory Visit: Payer: Medicare HMO | Admitting: Gastroenterology

## 2022-07-28 DIAGNOSIS — D500 Iron deficiency anemia secondary to blood loss (chronic): Secondary | ICD-10-CM

## 2022-07-28 NOTE — Progress Notes (Signed)
SN: [id number] Exp: 2023-11-07 [id number]  Patient arrived for VCE. Reported the prep went well. This RN explained capsule dietary restrictions for the next few hours. Pt advised to return at 4 pm to return capsule equipment.  Patient verbalized understanding. Opened capsule, ensured capsule was flashing prior to the patient swallowing the capsule. Patient swallowed capsule without difficulty.  Patient told to call the office with any questions and if capsule has not passed after 72 hours. No further questions by the conclusion of the visit.

## 2022-07-28 NOTE — Patient Instructions (Signed)
You may have clear liquids beginning at 10:30 am after ingesting the capsule.    You can have a light lunch at 12:30 pm; sandwich and half bowl of soup.  Return to the office at 4 pm to return the equipment.   Return to you normal diet at 5 pm.   Call [phone] and ask for Nancyann Cotterman BSN RN if you have any questions.  You should pass the capsule in your stool 8-48 hours after ingestion. If you have not passed the capsule, after 72 hours, please contact the office at [phone].    

## 2022-08-08 NOTE — Progress Notes (Signed)
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  No active bleeding was noted.  No mass lesion. 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  His most recent iron panel is unremarkable, and his hemoglobin is up to 11.4 from 9.4 in June. Recommend continued oral iron supplementation and repeat CBC and iron panel in May.  If his iron deficiency is worsening, will likely proceed with push enteroscopy and ablation of any AVMs.

## 2022-08-09 ENCOUNTER — Telehealth: Payer: Self-pay

## 2022-08-09 ENCOUNTER — Other Ambulatory Visit: Payer: Self-pay

## 2022-08-09 DIAGNOSIS — D500 Iron deficiency anemia secondary to blood loss (chronic): Secondary | ICD-10-CM

## 2022-08-09 NOTE — Telephone Encounter (Signed)
Lab orders and reminder in epic.

## 2022-08-09 NOTE — Telephone Encounter (Signed)
-----   Message from Daryel November, MD sent at 08/08/2022  4:48 PM EST ----- Regarding: repeat CBC, iron panel in May [name],  Can you put him in for a CBC and iron panel in May?  He is aware.  thanks

## 2022-08-14 ENCOUNTER — Encounter: Payer: Self-pay | Admitting: Gastroenterology

## 2022-08-28 ENCOUNTER — Ambulatory Visit: Payer: Medicare HMO | Admitting: Internal Medicine

## 2022-08-31 ENCOUNTER — Ambulatory Visit: Payer: Medicare HMO | Admitting: Internal Medicine

## 2022-09-05 DIAGNOSIS — G4733 Obstructive sleep apnea (adult) (pediatric): Secondary | ICD-10-CM | POA: Diagnosis not present

## 2022-09-05 DIAGNOSIS — I2510 Atherosclerotic heart disease of native coronary artery without angina pectoris: Secondary | ICD-10-CM | POA: Diagnosis not present

## 2022-09-05 DIAGNOSIS — D509 Iron deficiency anemia, unspecified: Secondary | ICD-10-CM | POA: Diagnosis not present

## 2022-09-05 DIAGNOSIS — I480 Paroxysmal atrial fibrillation: Secondary | ICD-10-CM | POA: Diagnosis not present

## 2022-09-12 DIAGNOSIS — Z959 Presence of cardiac and vascular implant and graft, unspecified: Secondary | ICD-10-CM | POA: Diagnosis not present

## 2022-10-02 ENCOUNTER — Ambulatory Visit: Payer: Medicare HMO | Admitting: Internal Medicine

## 2022-10-02 ENCOUNTER — Encounter: Payer: Self-pay | Admitting: Internal Medicine

## 2022-10-02 VITALS — BP 102/58 | HR 78 | Temp 98.2°F | Resp 16 | Ht 67.0 in | Wt 165.8 lb

## 2022-10-02 DIAGNOSIS — E1142 Type 2 diabetes mellitus with diabetic polyneuropathy: Secondary | ICD-10-CM

## 2022-10-02 MED ORDER — ROPINIROLE HCL 3 MG PO TABS: 3.0000 mg | ORAL_TABLET | Freq: Every day | ORAL | 3 refills | Status: DC

## 2022-10-02 NOTE — Progress Notes (Signed)
Office Visit  Subjective   Patient ID: Kristopher [name]   DOB: 1951-05-31   Age: 72 y.o.   MRN: [id number]   Chief Complaint Chief Complaint  Patient presents with   Follow-up     History of Present Illness The patient is a 72 year old Caucasian/White male who returns for a follow-up visit for his T2 diabetes. This past year his HgBA1c was elevated and I wanted to start him on ozempic but he wanted to hold off and talk to his endocrinologist first.  He has seen his endo doctor in followup and they did not change his medications.  His last saw endo as televisit in 12/2021.  Again, he was diagnosed with T2 diabetes around 2000. Since the last visit, there have been no problems. He remains on metformin 1000mg  BID and actos 15mg  daily.  I tried him on farxiga 5mg  daily but he could not afford this.  He is exercising by walking 1-2 miles per day.  He specifically denies unexplained abdominal pain, or documented hypoglycemia. He checks blood sugars at least 2-3 times per week and they tend to range somewhere between 100 and 140 mg/dl fasting. His last HgBA1c was done 3 months ago and was 6%.  He came in fasting today in anticipation of lab work. He does have complications of possible diabetic neuropathy involving his feet.  However he has had a laminectomy in the past which could contribute to this.  He remains on gabapentin 600mg  TID for tingling and mild pain which has not worsened. He also has complications of diabetic CAD. There is no diabetic retinopathy or nephropathy.   His last dilated eye exam was done on 01/09/2022 and this showed no evidence of diabetic retinopathy.  He states that over the last 1-2 weeks he has been having diarrhea 4-6 times per day.  There is no blood or mucus in his diarrhea.  There is no abdominal pain, fevers, chills, vomiting.  He has metformin for years and does not think it is caused by this.       Past Medical History Past Medical History:  Diagnosis Date   Anemia  12/2020   Aortic atherosclerosis    Atrial fibrillation    a. s/p PVI X2 by Dr Macon Large at [facility] around 2000.   CAD (coronary artery disease)    a. NSTEMI (06/2013):  LHC (06/27/2013):  pLAD 10-20, RCA 90, EF 45-50%, inf HK.  PCI:  Xience Alpine (3 x 23 mm) DES to the RCA. b. Relook cath 07/10/13 - patent stent. Imdur added. c. Recurrent CP after that - abnormal nuc 07/2017 with chest pain, dyspnea, hypotensive response. Rest images with mid-basal inferior thinning. No stress images due to abrupt d/c of test. Relook cath 3/12 - no significant r   Chronic back pain greater than 3 months duration    Cirrhosis, non-alcoholic    Depression    DJD (degenerative joint disease) of cervical spine    "3 herniated discs" (09/01/2013)   DJD (degenerative joint disease), lumbosacral    "bulging L5-S1" (09/01/2013)   Dysrhythmia    PVCs. A.fib with two ablations   Herniated cervical disc    c2-T1 fusion   History of kidney stones    turned into urosepsis   HLD (hyperlipidemia)    Ischemic cardiomyopathy    a. Echo (06/28/2011): Inferior and inferoseptal akinesis, EF 40%, grade 2 diastolic dysfunction, MAC, small effusion (no tamponade). B. EF 40% by echo 06/2013, cath 07/2013, 55% by cath  08/2013 with subtle mild mid inferior hypocontractility.   Liver cirrhosis secondary to NASH    Lumbar herniated disc    Neuromuscular disorder    bilateral neuropathy legs   NSTEMI (non-ST elevated myocardial infarction) 06/27/2013   Osteoarthritis    "neck and lower back" (09/01/2013)   Pericardial effusion    a. Chronic since hx of pericarditis in 2004. b. Mod by echo 06/2013, small by echo 06/2013.   Restless leg syndrome    Type II diabetes mellitus      Allergies Allergies  Allergen Reactions   Codeine Nausea And Vomiting   Duragesic-100 [Fentanyl] Nausea And Vomiting   Hydrocodone-Acetaminophen Nausea And Vomiting   Naprosyn [Naproxen] Nausea Only   Other Nausea And Vomiting    1. N/V to all opiates. He is able  to take versed. (noted 09/29/13) 2.The patient states that he has had  a reaction to all narcotics. He can take them though. (noted 06/26/13)     Medications  Current Outpatient Medications:    aspirin EC 81 MG tablet, Take 81 mg by mouth every evening. , Disp: , Rfl:    baclofen (LIORESAL) 10 MG tablet, Take 10 mg by mouth 3 (three) times daily as needed for muscle spasms., Disp: , Rfl:    cetirizine (ZYRTEC) 10 MG tablet, Take 10 mg by mouth daily as needed for allergies., Disp: , Rfl:    diltiazem (CARDIZEM SR) 120 MG 12 hr capsule, Take 120 mg by mouth every 36 (thirty-six) hours., Disp: , Rfl:    ELIQUIS 5 MG TABS tablet, Take 5 mg by mouth 2 (two) times daily., Disp: , Rfl:    escitalopram (LEXAPRO) 20 MG tablet, Take 20 mg by mouth daily., Disp: , Rfl:    ferrous sulfate 325 (65 FE) MG tablet, TAKE 1 TABLET BY MOUTH TWICE A DAY, Disp: 180 tablet, Rfl: 1   gabapentin (NEURONTIN) 600 MG tablet, Take 1 tablet (600 mg total) by mouth 3 (three) times daily., Disp: 180 tablet, Rfl: 5   ibuprofen (ADVIL) 200 MG tablet, Take 800 mg by mouth daily as needed for mild pain., Disp: , Rfl:    metFORMIN (GLUCOPHAGE-XR) 500 MG 24 hr tablet, Take 1,000 mg by mouth 2 (two) times daily., Disp: , Rfl:    Misc Natural Products (PUMPKIN SEED OIL) CAPS, Take 1 capsule by mouth daily. 3000 mg, Disp: , Rfl:    Multiple Vitamins-Iron (MULTIVITAMINS WITH IRON) TABS tablet, Take 1 tablet by mouth daily. (Patient not taking: Reported on 06/13/2022), Disp: 30 tablet, Rfl: 0   niacin 500 MG tablet, Take 500 mg by mouth at bedtime., Disp: , Rfl:    pantoprazole (PROTONIX) 40 MG tablet, Take 1 tablet (40 mg total) by mouth daily., Disp: 90 tablet, Rfl: 1   pioglitazone (ACTOS) 15 MG tablet, Take 15 mg by mouth daily., Disp: , Rfl:    promethazine (PHENERGAN) 25 MG tablet, Take 1 tablet (25 mg total) by mouth every 6 (six) hours as needed., Disp: 30 tablet, Rfl: 5   Pseudoeph-Doxylamine-DM-APAP (NYQUIL PO), Take 1 Dose by  mouth daily as needed (cold symptoms)., Disp: , Rfl:    rOPINIRole (REQUIP) 2 MG tablet, Take 1 tablet (2 mg total) by mouth at bedtime., Disp: 90 tablet, Rfl: 1   rosuvastatin (CRESTOR) 5 MG tablet, Take 5 mg by mouth every evening., Disp: , Rfl:    Saw Palmetto 450 MG CAPS, Take 450 mg by mouth at bedtime., Disp: , Rfl:    tamsulosin (FLOMAX) 0.4  MG CAPS capsule, Take 0.4 mg by mouth at bedtime., Disp: , Rfl:    Review of Systems Review of Systems  Constitutional:  Negative for chills and fever.  Eyes:  Negative for blurred vision and double vision.  Respiratory:  Negative for cough and shortness of breath.   Cardiovascular:  Negative for chest pain, palpitations and leg swelling.  Gastrointestinal:  Positive for diarrhea and nausea. Negative for abdominal pain, constipation and vomiting.  Musculoskeletal:  Negative for myalgias.  Neurological:  Negative for dizziness, weakness and headaches.       Objective:    Vitals BP (!) 102/58   Pulse 78   Temp 98.2 F (36.8 C)   Resp 16   Ht 5\' 7"  (1.702 m)   Wt 165 lb 12.8 oz (75.2 kg)   SpO2 92%   BMI 25.97 kg/m    Physical Examination Physical Exam Constitutional:      Appearance: Normal appearance. He is not ill-appearing.  Cardiovascular:     Rate and Rhythm: Normal rate and regular rhythm.     Pulses: Normal pulses.     Heart sounds: No murmur heard.    No friction rub. No gallop.  Pulmonary:     Effort: Pulmonary effort is normal. No respiratory distress.     Breath sounds: No wheezing, rhonchi or rales.  Abdominal:     General: Abdomen is flat. Bowel sounds are normal. There is no distension.     Palpations: Abdomen is soft.     Tenderness: There is no abdominal tenderness.  Musculoskeletal:     Right lower leg: No edema.     Left lower leg: No edema.  Skin:    General: Skin is warm and dry.     Findings: No rash.  Neurological:     Mental Status: He is alert.        Assessment & Plan:   Diabetic  polyneuropathy associated with type 2 diabetes mellitus (HCC) We will check his HgBA1c on him today.  He has been controlled in the past.   We will continue with goal <7%.  I want to check his FLP on his next visit with his history of CAD and T2 DM.    Return in about 3 months (around 01/01/2023).   Crist Fat, MD

## 2022-10-02 NOTE — Assessment & Plan Note (Signed)
We will check his HgBA1c on him today.  He has been controlled in the past.   We will continue with goal <7%.  I want to check his FLP on his next visit with his history of CAD and T2 DM.

## 2022-10-03 LAB — HEMOGLOBIN A1C
Est. average glucose Bld gHb Est-mCnc: 140 mg/dL
Hgb A1c MFr Bld: 6.5 % — ABNORMAL HIGH (ref 4.8–5.6)

## 2022-10-05 ENCOUNTER — Telehealth: Payer: Self-pay

## 2022-10-05 NOTE — Telephone Encounter (Signed)
Pt notified of lab results

## 2022-10-05 NOTE — Telephone Encounter (Signed)
-----   Message from Crist Fat, MD sent at 10/04/2022 11:51 AM EDT ----- His diabetes is controlled.

## 2022-10-14 ENCOUNTER — Other Ambulatory Visit: Payer: Self-pay | Admitting: Internal Medicine

## 2022-11-22 ENCOUNTER — Ambulatory Visit: Payer: Medicare HMO | Admitting: Internal Medicine

## 2022-11-22 ENCOUNTER — Encounter: Payer: Self-pay | Admitting: Internal Medicine

## 2022-11-22 VITALS — BP 108/50 | HR 77 | Temp 97.8°F | Resp 16 | Ht 67.0 in | Wt 169.8 lb

## 2022-11-22 DIAGNOSIS — G4733 Obstructive sleep apnea (adult) (pediatric): Secondary | ICD-10-CM | POA: Diagnosis not present

## 2022-11-22 NOTE — Progress Notes (Signed)
Office Visit  Subjective   Patient ID: Kristopher [name]   DOB: 04/28/1951   Age: 72 y.o.   MRN: [id number]   Chief Complaint Chief Complaint  Patient presents with   Acute Visit    Snoring     History of Present Illness Kristopher [name] is a 72 yo male who comes in today with his wife due to worsening snoring. They states that he has had intermittent snoring for 15 years but recently been worsening with his snoring for the last 6 months.  His wife states he does have frequent awakenings and takes multiple naps during the day.  He has been diagnosed with OSA in the past and was on CPAP back around 2015.  However, he could get parts for this and stopped using his CPAP around 2000.  He lost a lot of weight and thought he did not need the CPAP anymore.  He does have a history of RLS/periodic limb movement where he is on requip.     Past Medical History Past Medical History:  Diagnosis Date   Anemia 12/2020   Aortic atherosclerosis (HCC)    Atrial fibrillation (HCC)    a. s/p PVI X2 by Dr Macon Large at [facility] around 2000.   CAD (coronary artery disease)    a. NSTEMI (06/2013):  LHC (06/27/2013):  pLAD 10-20, RCA 90, EF 45-50%, inf HK.  PCI:  Xience Alpine (3 x 23 mm) DES to the RCA. b. Relook cath 07/10/13 - patent stent. Imdur added. c. Recurrent CP after that - abnormal nuc 07/2017 with chest pain, dyspnea, hypotensive response. Rest images with mid-basal inferior thinning. No stress images due to abrupt d/c of test. Relook cath 3/12 - no significant r   Chronic back pain greater than 3 months duration    Cirrhosis, non-alcoholic (HCC)    Depression    DJD (degenerative joint disease) of cervical spine    "3 herniated discs" (09/01/2013)   DJD (degenerative joint disease), lumbosacral    "bulging L5-S1" (09/01/2013)   Dysrhythmia    PVCs. A.fib with two ablations   Herniated cervical disc    c2-T1 fusion   History of kidney stones    turned into urosepsis   HLD (hyperlipidemia)    Ischemic  cardiomyopathy    a. Echo (06/28/2011): Inferior and inferoseptal akinesis, EF 40%, grade 2 diastolic dysfunction, MAC, small effusion (no tamponade). B. EF 40% by echo 06/2013, cath 07/2013, 55% by cath 08/2013 with subtle mild mid inferior hypocontractility.   Liver cirrhosis secondary to NASH [facility])    Lumbar herniated disc    Neuromuscular disorder (HCC)    bilateral neuropathy legs   NSTEMI (non-ST elevated myocardial infarction) (HCC) 06/27/2013   Osteoarthritis    "neck and lower back" (09/01/2013)   Pericardial effusion    a. Chronic since hx of pericarditis in 2004. b. Mod by echo 06/2013, small by echo 06/2013.   Restless leg syndrome    Type II diabetes mellitus (HCC)      Allergies Allergies  Allergen Reactions   Codeine Nausea And Vomiting   Duragesic-100 [Fentanyl] Nausea And Vomiting   Hydrocodone-Acetaminophen Nausea And Vomiting   Naprosyn [Naproxen] Nausea Only   Other Nausea And Vomiting    1. N/V to all opiates. He is able to take versed. (noted 09/29/13) 2.The patient states that he has had  a reaction to all narcotics. He can take them though. (noted 06/26/13)     Medications  Current Outpatient Medications:  aspirin EC 81 MG tablet, Take 81 mg by mouth every evening. , Disp: , Rfl:    baclofen (LIORESAL) 10 MG tablet, Take 10 mg by mouth 3 (three) times daily as needed for muscle spasms., Disp: , Rfl:    cetirizine (ZYRTEC) 10 MG tablet, Take 10 mg by mouth daily as needed for allergies., Disp: , Rfl:    diltiazem (CARDIZEM SR) 120 MG 12 hr capsule, Take 120 mg by mouth every 36 (thirty-six) hours., Disp: , Rfl:    ELIQUIS 5 MG TABS tablet, Take 5 mg by mouth 2 (two) times daily., Disp: , Rfl:    escitalopram (LEXAPRO) 20 MG tablet, TAKE 1 TABLET BY MOUTH EVERY DAY, Disp: 90 tablet, Rfl: 1   ferrous sulfate 325 (65 FE) MG tablet, TAKE 1 TABLET BY MOUTH TWICE A DAY, Disp: 180 tablet, Rfl: 1   gabapentin (NEURONTIN) 600 MG tablet, Take 1 tablet (600 mg total) by mouth  3 (three) times daily., Disp: 180 tablet, Rfl: 5   ibuprofen (ADVIL) 200 MG tablet, Take 800 mg by mouth daily as needed for mild pain., Disp: , Rfl:    metFORMIN (GLUCOPHAGE-XR) 500 MG 24 hr tablet, Take 1,000 mg by mouth 2 (two) times daily., Disp: , Rfl:    Misc Natural Products (PUMPKIN SEED OIL) CAPS, Take 1 capsule by mouth daily. 3000 mg, Disp: , Rfl:    Multiple Vitamins-Iron (MULTIVITAMINS WITH IRON) TABS tablet, Take 1 tablet by mouth daily. (Patient not taking: Reported on 06/13/2022), Disp: 30 tablet, Rfl: 0   niacin 500 MG tablet, Take 500 mg by mouth at bedtime., Disp: , Rfl:    pantoprazole (PROTONIX) 40 MG tablet, Take 1 tablet (40 mg total) by mouth daily., Disp: 90 tablet, Rfl: 1   pioglitazone (ACTOS) 15 MG tablet, Take 15 mg by mouth daily., Disp: , Rfl:    promethazine (PHENERGAN) 25 MG tablet, Take 1 tablet (25 mg total) by mouth every 6 (six) hours as needed., Disp: 30 tablet, Rfl: 5   Pseudoeph-Doxylamine-DM-APAP (NYQUIL PO), Take 1 Dose by mouth daily as needed (cold symptoms)., Disp: , Rfl:    rOPINIRole (REQUIP) 1 MG tablet, TAKE 1 TABLET BY MOUTH 1 TO 3 HOURS BEFORE BEDTIME, Disp: 90 tablet, Rfl: 1   rOPINIRole (REQUIP) 2 MG tablet, TAKE 1 TABLET BY MOUTH AT BEDTIME., Disp: 90 tablet, Rfl: 1   rOPINIRole (REQUIP) 3 MG tablet, Take 1 tablet (3 mg total) by mouth at bedtime., Disp: 90 tablet, Rfl: 3   rosuvastatin (CRESTOR) 5 MG tablet, Take 5 mg by mouth every evening., Disp: , Rfl:    Saw Palmetto 450 MG CAPS, Take 450 mg by mouth at bedtime., Disp: , Rfl:    tamsulosin (FLOMAX) 0.4 MG CAPS capsule, Take 0.4 mg by mouth at bedtime., Disp: , Rfl:    Review of Systems Review of Systems  Constitutional:  Negative for chills and fever.  Respiratory:  Negative for cough and shortness of breath.   Cardiovascular:  Negative for chest pain, palpitations and leg swelling.  Gastrointestinal:  Positive for diarrhea, nausea and vomiting. Negative for abdominal pain and  constipation.  Neurological:  Negative for dizziness, weakness and headaches.       Objective:    Vitals BP (!) 108/50   Pulse 77   Temp 97.8 F (36.6 C)   Resp 16   Ht 5\' 7"  (1.702 m)   Wt 169 lb 12.8 oz (77 kg)   SpO2 93%   BMI 26.59 kg/m  Physical Examination Physical Exam Constitutional:      Appearance: Normal appearance. He is not ill-appearing.  Cardiovascular:     Rate and Rhythm: Normal rate and regular rhythm.     Pulses: Normal pulses.     Heart sounds: No murmur heard.    No friction rub. No gallop.  Pulmonary:     Effort: Pulmonary effort is normal. No respiratory distress.     Breath sounds: No wheezing, rhonchi or rales.  Abdominal:     General: Abdomen is flat. Bowel sounds are normal. There is no distension.     Palpations: Abdomen is soft.     Tenderness: There is no abdominal tenderness.  Musculoskeletal:     Right lower leg: No edema.     Left lower leg: No edema.  Skin:    General: Skin is warm and dry.     Findings: No rash.  Neurological:     Mental Status: He is alert.        Assessment & Plan:   OSA (obstructive sleep apnea) We will refer him back to pulmonary for evaluation and titration.    No follow-ups on file.   Crist Fat, MD

## 2022-11-22 NOTE — Assessment & Plan Note (Signed)
We will refer him back to pulmonary for evaluation and titration.

## 2022-12-05 DIAGNOSIS — Z8679 Personal history of other diseases of the circulatory system: Secondary | ICD-10-CM | POA: Diagnosis not present

## 2022-12-05 DIAGNOSIS — I4891 Unspecified atrial fibrillation: Secondary | ICD-10-CM | POA: Diagnosis not present

## 2022-12-05 DIAGNOSIS — Z959 Presence of cardiac and vascular implant and graft, unspecified: Secondary | ICD-10-CM | POA: Diagnosis not present

## 2022-12-11 ENCOUNTER — Ambulatory Visit: Payer: Medicare HMO | Admitting: Internal Medicine

## 2022-12-11 ENCOUNTER — Telehealth: Payer: Self-pay | Admitting: Internal Medicine

## 2022-12-11 ENCOUNTER — Encounter: Payer: Self-pay | Admitting: Internal Medicine

## 2022-12-11 VITALS — BP 115/68 | HR 75 | Temp 98.3°F | Resp 16 | Ht 67.0 in | Wt 169.2 lb

## 2022-12-11 DIAGNOSIS — D508 Other iron deficiency anemias: Secondary | ICD-10-CM

## 2022-12-11 DIAGNOSIS — G2581 Restless legs syndrome: Secondary | ICD-10-CM | POA: Diagnosis not present

## 2022-12-11 DIAGNOSIS — G4733 Obstructive sleep apnea (adult) (pediatric): Secondary | ICD-10-CM | POA: Diagnosis not present

## 2022-12-11 NOTE — Progress Notes (Signed)
[facility] [address] [place], [place] [address]  Pulmonary Sleep Medicine   Office Visit Note  Patient Name: Kristopher [name] DOB: [birth year] MRN [id number]  Date of Service: 12/11/2022  Complaints/HPI: His wife has noted that he is snoring more. He has history of OSA and CSA. Has not been on the PAP for over 10 years. He feels fine and does not feel sleepy. He has history of A fib and seems to be having issues with it again. He has noted at least 40-50 pounds since his last evaluation.  Patient also states that his cardiac intervention has worked although he is still somewhat concerned about atrial fibrillation and on occasion he does note palpitations.  The patient states he is sometimes tired.  He is pending relatively as active as he was the last time that he was here.  Office Spirometry Results:     ROS  General: (-) fever, (-) chills, (-) night sweats, (-) weakness Skin: (-) rashes, (-) itching,. Eyes: (-) visual changes, (-) redness, (-) itching. Nose and Sinuses: (-) nasal stuffiness or itchiness, (-) postnasal drip, (-) nosebleeds, (-) sinus trouble. Mouth and Throat: (-) sore throat, (-) hoarseness. Neck: (-) swollen glands, (-) enlarged thyroid, (-) neck pain. Respiratory: - cough, (-) bloody sputum, - shortness of breath, - wheezing. Cardiovascular: - ankle swelling, (-) chest pain. Lymphatic: (-) lymph node enlargement. Neurologic: (-) numbness, (-) tingling. Psychiatric: (-) anxiety, (-) depression   Current Medication: Outpatient Encounter Medications as of 12/11/2022  Medication Sig   aspirin EC 81 MG tablet Take 81 mg by mouth every evening.    baclofen (LIORESAL) 10 MG tablet Take 10 mg by mouth 3 (three) times daily as needed for muscle spasms.   cetirizine (ZYRTEC) 10 MG tablet Take 10 mg by mouth daily as needed for allergies.   diltiazem (CARDIZEM SR) 120 MG 12 hr capsule Take 120 mg by mouth every 36 (thirty-six) hours.   ELIQUIS 5 MG TABS  tablet Take 5 mg by mouth 2 (two) times daily.   escitalopram (LEXAPRO) 20 MG tablet TAKE 1 TABLET BY MOUTH EVERY DAY   ferrous sulfate 325 (65 FE) MG tablet TAKE 1 TABLET BY MOUTH TWICE A DAY   gabapentin (NEURONTIN) 600 MG tablet Take 1 tablet (600 mg total) by mouth 3 (three) times daily.   ibuprofen (ADVIL) 200 MG tablet Take 800 mg by mouth daily as needed for mild pain.   metFORMIN (GLUCOPHAGE-XR) 500 MG 24 hr tablet Take 1,000 mg by mouth 2 (two) times daily.   Misc Natural Products (PUMPKIN SEED OIL) CAPS Take 1 capsule by mouth daily. 3000 mg   Multiple Vitamins-Iron (MULTIVITAMINS WITH IRON) TABS tablet Take 1 tablet by mouth daily.   niacin 500 MG tablet Take 500 mg by mouth at bedtime.   pantoprazole (PROTONIX) 40 MG tablet Take 1 tablet (40 mg total) by mouth daily.   pioglitazone (ACTOS) 15 MG tablet Take 15 mg by mouth daily.   promethazine (PHENERGAN) 25 MG tablet Take 1 tablet (25 mg total) by mouth every 6 (six) hours as needed.   Pseudoeph-Doxylamine-DM-APAP (NYQUIL PO) Take 1 Dose by mouth daily as needed (cold symptoms).   rOPINIRole (REQUIP) 1 MG tablet TAKE 1 TABLET BY MOUTH 1 TO 3 HOURS BEFORE BEDTIME   rOPINIRole (REQUIP) 2 MG tablet TAKE 1 TABLET BY MOUTH AT BEDTIME.   rOPINIRole (REQUIP) 3 MG tablet Take 1 tablet (3 mg total) by mouth at bedtime.   rosuvastatin (CRESTOR) 5  MG tablet Take 5 mg by mouth every evening.   Saw Palmetto 450 MG CAPS Take 450 mg by mouth at bedtime.   tamsulosin (FLOMAX) 0.4 MG CAPS capsule Take 0.4 mg by mouth at bedtime.   No facility-administered encounter medications on file as of 12/11/2022.    Surgical History: Past Surgical History:  Procedure Laterality Date   APPENDECTOMY  ~ 1965   ATRIAL FIBRILLATION ABLATION  06/19/1998   Dr Macon Large at [facility]   BIOPSY  06/15/2022   Procedure: BIOPSY;  Surgeon: Jenel Lucks, MD;  Location: [facility];  Service: Gastroenterology;;   CARDIAC CATHETERIZATION     COLONOSCOPY WITH  PROPOFOL N/A 06/15/2022   Procedure: COLONOSCOPY WITH PROPOFOL;  Surgeon: Jenel Lucks, MD;  Location: [facility];  Service: Gastroenterology;  Laterality: N/A;   CORONARY ANGIOPLASTY     CYSTOSCOPY WITH RETROGRADE URETHROGRAM N/A 07/21/2021   Procedure: CYSTOSCOPY WITH URETHRAL DILATION;  Surgeon: Crist Fat, MD;  Location: [facility];  Service: Urology;  Laterality: N/A;  45 MINS   ESOPHAGOGASTRODUODENOSCOPY (EGD) WITH PROPOFOL N/A 06/15/2022   Procedure: ESOPHAGOGASTRODUODENOSCOPY (EGD) WITH PROPOFOL;  Surgeon: Jenel Lucks, MD;  Location: [facility];  Service: Gastroenterology;  Laterality: N/A;   LEFT AND RIGHT HEART CATHETERIZATION WITH CORONARY ANGIOGRAM N/A 10/07/2013   Procedure: LEFT AND RIGHT HEART CATHETERIZATION WITH CORONARY ANGIOGRAM;  Surgeon: Lennette Bihari, MD;  Location: [facility] CATH LAB;  Service: Cardiovascular;  Laterality: N/A;   LEFT HEART CATHETERIZATION WITH CORONARY ANGIOGRAM N/A 06/27/2013   Procedure: LEFT HEART CATHETERIZATION WITH CORONARY ANGIOGRAM;  Surgeon: Lennette Bihari, MD;  Location: [facility] CATH LAB;  Service: Cardiovascular;  Laterality: N/A;   LEFT HEART CATHETERIZATION WITH CORONARY ANGIOGRAM N/A 07/10/2013   Procedure: LEFT HEART CATHETERIZATION WITH CORONARY ANGIOGRAM;  Surgeon: Kathleene Hazel, MD;  Location: [facility] CATH LAB;  Service: Cardiovascular;  Laterality: N/A;   LEFT HEART CATHETERIZATION WITH CORONARY ANGIOGRAM N/A 08/28/2013   Procedure: LEFT HEART CATHETERIZATION WITH CORONARY ANGIOGRAM;  Surgeon: Lennette Bihari, MD;  Location: [facility] CATH LAB;  Service: Cardiovascular;  Laterality: N/A;   NEPHROLITHOTOMY Left 02/11/2021   Procedure: LEFT NEPHROLITHOTOMY PERCUTANEOUS WITH SURGEON ACCESS;  Surgeon: Crist Fat, MD;  Location: [facility];  Service: Urology;  Laterality: Left;   PERCUTANEOUS CORONARY STENT INTERVENTION (PCI-S)  06/27/2013   Procedure: PERCUTANEOUS CORONARY STENT INTERVENTION (PCI-S);  Surgeon: Lennette Bihari, MD;   Location: [facility] CATH LAB;  Service: Cardiovascular;;   POLYPECTOMY  06/15/2022   Procedure: POLYPECTOMY;  Surgeon: Jenel Lucks, MD;  Location: [facility];  Service: Gastroenterology;;   POSTERIOR CERVICAL FUSION/FORAMINOTOMY  06/20/2007   fusion c3-t1   PULMONARY VEIN ABLATION  06/20/1999   Dr Macon Large at [facility]   TONSILLECTOMY  06/19/1957   TRANSFORAMINAL LUMBAR INTERBODY FUSION W/ MIS 1 LEVEL Left 12/08/2021   Procedure: LEFT LUMBAR FIVE-SACRAL ONE MINIMALLY INVASIVE (MIS) TRANSFORAMINAL LUMBAR INTERBODY FUSION;  Surgeon: Jadene Pierini, MD;  Location: [facility] OR;  Service: Neurosurgery;  Laterality: Left;    Medical History: Past Medical History:  Diagnosis Date   Anemia 12/2020   Aortic atherosclerosis (HCC)    Atrial fibrillation (HCC)    a. s/p PVI X2 by Dr Macon Large at [facility] around 2000.   CAD (coronary artery disease)    a. NSTEMI (06/2013):  LHC (06/27/2013):  pLAD 10-20, RCA 90, EF 45-50%, inf HK.  PCI:  Xience Alpine (3 x 23 mm) DES to the RCA. b. Relook cath 07/10/13 - patent stent. Imdur added. c. Recurrent  CP after that - abnormal nuc 07/2017 with chest pain, dyspnea, hypotensive response. Rest images with mid-basal inferior thinning. No stress images due to abrupt d/c of test. Relook cath 3/12 - no significant r   Chronic back pain greater than 3 months duration    Cirrhosis, non-alcoholic (HCC)    Depression    DJD (degenerative joint disease) of cervical spine    "3 herniated discs" (09/01/2013)   DJD (degenerative joint disease), lumbosacral    "bulging L5-S1" (09/01/2013)   Dysrhythmia    PVCs. A.fib with two ablations   Herniated cervical disc    c2-T1 fusion   History of kidney stones    turned into urosepsis   HLD (hyperlipidemia)    Ischemic cardiomyopathy    a. Echo (06/28/2011): Inferior and inferoseptal akinesis, EF 40%, grade 2 diastolic dysfunction, MAC, small effusion (no tamponade). B. EF 40% by echo 06/2013, cath 07/2013, 55% by cath 08/2013 with subtle mild  mid inferior hypocontractility.   Liver cirrhosis secondary to [facility])    Lumbar herniated disc    Neuromuscular disorder (HCC)    bilateral neuropathy legs   NSTEMI (non-ST elevated myocardial infarction) (HCC) 06/27/2013   Osteoarthritis    "neck and lower back" (09/01/2013)   Pericardial effusion    a. Chronic since hx of pericarditis in 2004. b. Mod by echo 06/2013, small by echo 06/2013.   Restless leg syndrome    Type II diabetes mellitus (HCC)     Family History: Family History  Problem Relation Age of Onset   Ovarian cancer Mother    Arthritis Mother    CAD Father    Liver disease Father    Diabetes Father    Arthritis Father    COPD Father    Arthritis Sister    Dementia Sister    Stroke Maternal Grandmother    Sudden death Maternal Grandfather    Heart attack Maternal Grandfather    ALS Paternal Grandmother    Cancer Paternal Grandfather        unknown   Arrhythmia Neg Hx     Social History: Social History   Socioeconomic History   Marital status: Married    Spouse name: Not on file   Number of children: 0   Years of education: Not on file   Highest education level: Not on file  Occupational History   Occupation: RESP/retired    Employer: [employer]    Comment: Theatre manager at [employer]  Tobacco Use   Smoking status: Former    Packs/day: 1.50    Years: 43.00    Additional pack years: 0.00    Total pack years: 64.50    Types: Cigarettes    Quit date: 05/06/2007    Years since quitting: 15.6   Smokeless tobacco: Never  Vaping Use   Vaping Use: Never used  Substance and Sexual Activity   Alcohol use: Yes    Comment: once monthly/rarely   Drug use: No   Sexual activity: Not Currently  Other Topics Concern   Not on file  Social History Narrative   Not on file   Social Determinants of Health   Financial Resource Strain: Not on file  Food Insecurity: Not on file  Transportation Needs: Not on file  Physical  Activity: Not on file  Stress: Not on file  Social Connections: Not on file  Intimate Partner Violence: Not on file    Vital Signs: Blood pressure 115/68, pulse 75, temperature 98.3 F (36.8  C), resp. rate 16, height 5\' 7"  (1.702 m), weight 169 lb 3.2 oz (76.7 kg), SpO2 96 %.  Examination: General Appearance: The patient is well-developed, well-nourished, and in no distress. Skin: Gross inspection of skin unremarkable. Head: normocephalic, no gross deformities. Eyes: no gross deformities noted. ENT: ears appear grossly normal no exudates. Neck: Supple. No thyromegaly. No LAD. Respiratory: no rhonchi noted. Cardiovascular: Normal S1 and S2 without murmur or rub. Extremities: No cyanosis. pulses are equal. Neurologic: Alert and oriented. No involuntary movements.  LABS: Recent Results (from the past 2160 hour(s))  Hemoglobin A1c     Status: Abnormal   Collection Time: 10/02/22  2:00 PM  Result Value Ref Range   Hgb A1c MFr Bld 6.5 (H) 4.8 - 5.6 %    Comment:          Prediabetes: 5.7 - 6.4          Diabetes: >6.4          Glycemic control for adults with diabetes: <7.0    Est. average glucose Bld gHb Est-mCnc 140 mg/dL    Radiology: No results found.  No results found.  No results found.  Assessment and Plan: Patient Active Problem List   Diagnosis Date Noted   OSA (obstructive sleep apnea) 11/22/2022   Wheezing 07/17/2022   Atrial fibrillation (HCC) 07/17/2022   Diabetic polyneuropathy associated with type 2 diabetes mellitus (HCC) 06/30/2022   Aortic atherosclerosis (HCC) 06/30/2022   Herniated disc, cervical 06/30/2022   Lumbar herniated disc 06/30/2022   Vitamin B12 deficiency 06/30/2022   BMI 25.0-25.9,adult 06/30/2022   Iron (Fe) deficiency anemia 03/13/2022   Iron deficiency anemia, unspecified 02/23/2022   Acute respiratory failure with hypoxia (HCC) 12/13/2021   SIRS (systemic inflammatory response syndrome) (HCC) 12/13/2021   Nausea and vomiting  12/13/2021   Constipation 12/13/2021   Spondylolisthesis of lumbar region 12/08/2021   Hypokalemia    Hypomagnesemia    Primary hypertension    Hypercholesterolemia    Acute cephalic vein thrombosis, right    Dyspnea    Atrial fibrillation with RVR (HCC)    ESBL (extended spectrum beta-lactamase) producing bacteria infection    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)    Gram-negative bacteremia    Hematuria    Sepsis with acute renal failure without septic shock (HCC)    Severe Sepsis secondary to UTI (HCC) 02/23/2021   AKI (acute kidney injury) (HCC) 02/23/2021   Anemia 02/23/2021   Thrombocytopenia (HCC) 02/23/2021   Nephrolithiasis 02/11/2021   Syncope 09/18/2019   Encounter for general adult medical examination with abnormal findings 04/30/2018   Muscle spasm 04/30/2018   Dysuria 04/30/2018   Cirrhosis (HCC) 12/17/2015   Pericardial effusion 07/22/2014   Restless legs syndrome 06/26/2014   Other amnesia 06/04/2014   Coronary artery disease involving native coronary artery of native heart without angina pectoris 04/13/2014   Cardiomyopathy, ischemic 04/13/2014   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   Obstructive sleep apnea syndrome 03/12/2014   Acute pericarditis, unspecified - possible 09/04/2013   Exertional chest pain 09/04/2013   Chest pain with moderate risk for cardiac etiology - etiology likely pericarditis 09/01/2013   Abnormal exercise tolerance test 08/26/2013   Depression 08/07/2013   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   Diabetes mellitus, type 2 07/17/2013   Type 2 diabetes mellitus without complications (HCC) 07/17/2013   Unstable angina (HCC) 07/09/2013   Atrial arrhythmia    Non-ST elevation myocardial infarction (NSTEMI), subendocardial infarction, subsequent episode of care (HCC) 06/27/2013  Class: History of    1. OSA (obstructive sleep apnea) With his history of obstructive sleep apnea and cardiac disease he needs to have a  follow-up PSG has not been on treatment for over 10 years and if he is still positive he will need to go on CPAP therapy. - PSG Sleep Study; Future  2. RLS (restless legs syndrome) This is significant may be actually getting worse due to obstructive sleep apnea so we will therefore follow along after sleep study is done  3. Other iron deficiency anemia Get follow-up lab work if patient is still anemic, needs to have replenishment of iron stores.  General Counseling: I have discussed the findings of the evaluation and examination with Fayrene Fearing.  I have also discussed any further diagnostic evaluation thatmay be needed or ordered today. Jahzier verbalizes understanding of the findings of todays visit. We also reviewed his medications today and discussed drug interactions and side effects including but not limited excessive drowsiness and altered mental states. We also discussed that there is always a risk not just to him but also people around him. he has been encouraged to call the office with any questions or concerns that should arise related to todays visit.  Orders Placed This Encounter  Procedures   PSG Sleep Study    Standing Status:   Future    Standing Expiration Date:   12/11/2023    Order Specific Question:   Where should this test be performed:    Answer:   [facility]     Time spent: 80  I have personally obtained a history, examined the patient, evaluated laboratory and imaging results, formulated the assessment and plan and placed orders.    Yevonne Pax, MD [facility] Pulmonary and Critical Care Sleep medicine

## 2022-12-11 NOTE — Patient Instructions (Signed)

## 2022-12-11 NOTE — Telephone Encounter (Signed)
Awaiting 12/11/22 office notes for SS order-Toni

## 2022-12-15 ENCOUNTER — Telehealth: Payer: Self-pay | Admitting: Internal Medicine

## 2022-12-15 NOTE — Telephone Encounter (Signed)
SS order placed in [facility] folder-Toni 

## 2023-01-03 ENCOUNTER — Ambulatory Visit: Payer: Medicare HMO | Admitting: Internal Medicine

## 2023-01-03 ENCOUNTER — Encounter: Payer: Self-pay | Admitting: Internal Medicine

## 2023-01-03 VITALS — BP 112/62 | HR 88 | Temp 97.4°F | Resp 18 | Ht 67.0 in | Wt 159.4 lb

## 2023-01-03 DIAGNOSIS — E1159 Type 2 diabetes mellitus with other circulatory complications: Secondary | ICD-10-CM | POA: Diagnosis not present

## 2023-01-03 DIAGNOSIS — G4733 Obstructive sleep apnea (adult) (pediatric): Secondary | ICD-10-CM

## 2023-01-03 DIAGNOSIS — R112 Nausea with vomiting, unspecified: Secondary | ICD-10-CM | POA: Diagnosis not present

## 2023-01-03 DIAGNOSIS — I2510 Atherosclerotic heart disease of native coronary artery without angina pectoris: Secondary | ICD-10-CM | POA: Diagnosis not present

## 2023-01-03 DIAGNOSIS — E7800 Pure hypercholesterolemia, unspecified: Secondary | ICD-10-CM | POA: Diagnosis not present

## 2023-01-03 DIAGNOSIS — D649 Anemia, unspecified: Secondary | ICD-10-CM

## 2023-01-03 DIAGNOSIS — E1142 Type 2 diabetes mellitus with diabetic polyneuropathy: Secondary | ICD-10-CM

## 2023-01-03 NOTE — Assessment & Plan Note (Signed)
He has had iron deficieny anemia where we did iron studies in 06/2022.  He remains on iron supplementation today and we will recheck his iron studies.

## 2023-01-03 NOTE — Progress Notes (Signed)
Office Visit  Subjective   Patient ID: Kristopher [name]   DOB: 11/12/1950   Age: 72 y.o.   MRN: [id number]   Chief Complaint Chief Complaint  Patient presents with   Follow-up    Fasting     History of Present Illness Kristopher [name] is a 72 year old male who returns today for a 3 month followup.  I saw him about a month ago due to worsening snoring. They states that he has had intermittent snoring for 15 years but recently been worsening with his snoring for the last 6 months.  His wife states he does have frequent awakenings and takes multiple naps during the day.  He has been diagnosed with OSA in the past and was on CPAP back around 2015.  However, he could get parts for this and stopped using his CPAP around 2000.  He lost a lot of weight and thought he did not need the CPAP anymore.  He does have a history of RLS/periodic limb movement where he is on requip.  I did refer him to Dr. Welton Flakes in Pulm/sleep medicine who he saw on 12/11/2022.  He felt the patient needed a followup sleep study and they are currently arranging this.    The patient is a 72 year old Caucasian/White male who returns for a follow-up visit for his T2 diabetes. This past year his HgBA1c was elevated and I wanted to start him on ozempic but he wanted to hold off and talk to his endocrinologist first.  He has seen his endo doctor in followup and they did not change his medications.  His last saw endo as televisit in 12/2021.  Again, he was diagnosed with T2 diabetes around 2000. Since the last visit, there have been no problems. He remains on metformin 1000mg  BID and actos 15mg  daily.  I tried him on farxiga 5mg  daily but he could not afford this.  He is exercising by walking 1-2 miles per day.  He specifically denies unexplained abdominal pain, or documented hypoglycemia. He checks blood sugars about once per week and they tend to range somewhere between 100 and 140 mg/dl fasting. His last HgBA1c was done 3 months ago and was 6%.  He came  in fasting today in anticipation of lab work. He does have complications of possible diabetic neuropathy involving his feet.  However he has had a laminectomy in the past which could contribute to this.  He remains on gabapentin 600mg  TID for tingling and mild pain which has not worsened. He also has complications of diabetic CAD. There is no diabetic retinopathy or nephropathy.   His last dilated eye exam was done per the patient in 09/2022 and this showed no evidence of diabetic retinopathy.  This patient also has moderately severe CAD which was diagnosed around 1999 and presents today for a status visit. He did have a MI with a NSTEMI in 2015 where he has only had one MI in the past. He underwent a heart catherization at that time where he had PCI of the RCA with placement of a drug eluting stent. He remains on ASA 81mg  at this time. He did have a cardiac MRI done in 05/2018 which showed a basal mid-inferior wall and basal mid infero-septal wall which was mildly hypokinetic with a LVEF 55%. His LA was mildly enlarged. They noted a moderate sized pericardial effusion. His adenosine stress with the MRI showed no perfusion defects suggestive of inducible myocardial ischemia. See PMH for summary of  cardiac history and status of revascularization. His CAD is controlled with therapy as summarized in the medication list and previous notes. The patient has the following comorbid condition(s): atrial fibrillation. He has no baseline symptoms of CAD. He has the following modifiable risk factor(s): DM and hyperlipidemia. Specifically denied complaint(s): chest pain, palpitations, orthopnea, edema, and exertional dyspnea. He did see cardiology in 2021 where he developed vasovagal syncope. He has seen EP where they placed an implanted loop recorder which he states did not show any significant arrythmia.  EP has told him to hydrate and keep off caffeine and he probably had dehydration/vasovagal syncope.   Kristopher [name] returns  today for routine followup on his cholesterol. Overall, he states he is doing well and is without any complaints or problems at this time. He specifically denies abdominal pain, vomiting, diarrhea, myalgias, and fatigue. He remains on dietary management as well as the following cholesterol lowering medications Co Q-10 150 mg oral capsule, niacin 500 mg oral tablet extended release, and rosuvastatin 5 mg oral tablet. He is fasting in anticipation for labs today.      Past Medical History Past Medical History:  Diagnosis Date   Anemia 12/2020   Aortic atherosclerosis (HCC)    Atrial fibrillation (HCC)    a. s/p PVI X2 by Dr Macon Large at [facility] around 2000.   CAD (coronary artery disease)    a. NSTEMI (06/2013):  LHC (06/27/2013):  pLAD 10-20, RCA 90, EF 45-50%, inf HK.  PCI:  Xience Alpine (3 x 23 mm) DES to the RCA. b. Relook cath 07/10/13 - patent stent. Imdur added. c. Recurrent CP after that - abnormal nuc 07/2017 with chest pain, dyspnea, hypotensive response. Rest images with mid-basal inferior thinning. No stress images due to abrupt d/c of test. Relook cath 3/12 - no significant r   Chronic back pain greater than 3 months duration    Cirrhosis, non-alcoholic (HCC)    Depression    DJD (degenerative joint disease) of cervical spine    "3 herniated discs" (09/01/2013)   DJD (degenerative joint disease), lumbosacral    "bulging L5-S1" (09/01/2013)   Dysrhythmia    PVCs. A.fib with two ablations   Herniated cervical disc    c2-T1 fusion   History of kidney stones    turned into urosepsis   HLD (hyperlipidemia)    Ischemic cardiomyopathy    a. Echo (06/28/2011): Inferior and inferoseptal akinesis, EF 40%, grade 2 diastolic dysfunction, MAC, small effusion (no tamponade). B. EF 40% by echo 06/2013, cath 07/2013, 55% by cath 08/2013 with subtle mild mid inferior hypocontractility.   Liver cirrhosis secondary to NASH [facility])    Lumbar herniated disc    Neuromuscular disorder (HCC)    bilateral neuropathy  legs   NSTEMI (non-ST elevated myocardial infarction) (HCC) 06/27/2013   Osteoarthritis    "neck and lower back" (09/01/2013)   Pericardial effusion    a. Chronic since hx of pericarditis in 2004. b. Mod by echo 06/2013, small by echo 06/2013.   Restless leg syndrome    Type II diabetes mellitus (HCC)      Allergies Allergies  Allergen Reactions   Codeine Nausea And Vomiting   Duragesic-100 [Fentanyl] Nausea And Vomiting   Hydrocodone-Acetaminophen Nausea And Vomiting   Naprosyn [Naproxen] Nausea Only   Other Nausea And Vomiting    1. N/V to all opiates. He is able to take versed. (noted 09/29/13) 2.The patient states that he has had  a reaction to all narcotics. He can take  them though. (noted 06/26/13)     Medications  Current Outpatient Medications:    PRECISION QID TEST test strip, 1 each by Other route as needed., Disp: , Rfl:    aspirin EC 81 MG tablet, Take 81 mg by mouth every evening. , Disp: , Rfl:    baclofen (LIORESAL) 10 MG tablet, Take 10 mg by mouth 3 (three) times daily as needed for muscle spasms., Disp: , Rfl:    buPROPion (WELLBUTRIN XL) 300 MG 24 hr tablet, Take 300 mg by mouth daily., Disp: , Rfl:    cetirizine (ZYRTEC) 10 MG tablet, Take 10 mg by mouth daily as needed for allergies., Disp: , Rfl:    diltiazem (CARDIZEM SR) 120 MG 12 hr capsule, Take 120 mg by mouth every 36 (thirty-six) hours., Disp: , Rfl:    ELIQUIS 5 MG TABS tablet, Take 5 mg by mouth 2 (two) times daily., Disp: , Rfl:    escitalopram (LEXAPRO) 20 MG tablet, TAKE 1 TABLET BY MOUTH EVERY DAY, Disp: 90 tablet, Rfl: 1   ferrous sulfate 325 (65 FE) MG tablet, TAKE 1 TABLET BY MOUTH TWICE A DAY, Disp: 180 tablet, Rfl: 1   gabapentin (NEURONTIN) 600 MG tablet, Take 1 tablet (600 mg total) by mouth 3 (three) times daily., Disp: 180 tablet, Rfl: 5   ibuprofen (ADVIL) 200 MG tablet, Take 800 mg by mouth daily as needed for mild pain., Disp: , Rfl:    metFORMIN (GLUCOPHAGE-XR) 500 MG 24 hr tablet, Take  1,000 mg by mouth 2 (two) times daily., Disp: , Rfl:    Misc Natural Products (PUMPKIN SEED OIL) CAPS, Take 1 capsule by mouth daily. 3000 mg, Disp: , Rfl:    Multiple Vitamins-Iron (MULTIVITAMINS WITH IRON) TABS tablet, Take 1 tablet by mouth daily., Disp: 30 tablet, Rfl: 0   niacin 500 MG tablet, Take 500 mg by mouth at bedtime., Disp: , Rfl:    pantoprazole (PROTONIX) 40 MG tablet, Take 1 tablet (40 mg total) by mouth daily., Disp: 90 tablet, Rfl: 1   pioglitazone (ACTOS) 15 MG tablet, Take 15 mg by mouth daily., Disp: , Rfl:    promethazine (PHENERGAN) 25 MG tablet, Take 1 tablet (25 mg total) by mouth every 6 (six) hours as needed., Disp: 30 tablet, Rfl: 5   rOPINIRole (REQUIP) 1 MG tablet, TAKE 1 TABLET BY MOUTH 1 TO 3 HOURS BEFORE BEDTIME, Disp: 90 tablet, Rfl: 1   rOPINIRole (REQUIP) 2 MG tablet, TAKE 1 TABLET BY MOUTH AT BEDTIME., Disp: 90 tablet, Rfl: 1   rOPINIRole (REQUIP) 3 MG tablet, Take 1 tablet (3 mg total) by mouth at bedtime., Disp: 90 tablet, Rfl: 3   rosuvastatin (CRESTOR) 5 MG tablet, Take 5 mg by mouth every evening., Disp: , Rfl:    Saw Palmetto 450 MG CAPS, Take 450 mg by mouth at bedtime., Disp: , Rfl:    tamsulosin (FLOMAX) 0.4 MG CAPS capsule, Take 0.4 mg by mouth at bedtime., Disp: , Rfl:    Review of Systems Review of Systems  Constitutional:  Negative for chills, fever and malaise/fatigue.  Eyes:  Negative for blurred vision and double vision.  Respiratory:  Negative for cough, shortness of breath and wheezing.   Cardiovascular:  Negative for chest pain, palpitations, orthopnea and leg swelling.  Gastrointestinal:  Positive for diarrhea. Negative for abdominal pain, constipation, heartburn, nausea and vomiting.  Genitourinary:  Negative for frequency.  Musculoskeletal:  Negative for myalgias.  Skin:  Negative for itching and rash.  Neurological:  Negative for dizziness, weakness and headaches.  Endo/Heme/Allergies:  Negative for polydipsia.       Objective:     Vitals BP 112/62 (BP Location: Right Arm, Patient Position: Sitting, Cuff Size: Normal)   Pulse 88   Temp (!) 97.4 F (36.3 C) (Temporal)   Resp 18   Ht 5\' 7"  (1.702 m)   Wt 159 lb 6.4 oz (72.3 kg)   SpO2 96%   BMI 24.97 kg/m    Physical Examination Physical Exam Constitutional:      Appearance: Normal appearance. He is not ill-appearing.  Neck:     Vascular: No carotid bruit.  Cardiovascular:     Rate and Rhythm: Normal rate and regular rhythm.     Pulses: Normal pulses.     Heart sounds: No murmur heard.    No friction rub. No gallop.  Pulmonary:     Effort: Pulmonary effort is normal. No respiratory distress.     Breath sounds: No wheezing, rhonchi or rales.  Abdominal:     General: Abdomen is flat. Bowel sounds are normal. There is no distension.     Palpations: Abdomen is soft.     Tenderness: There is no abdominal tenderness.  Musculoskeletal:     Cervical back: No tenderness.     Right lower leg: No edema.     Left lower leg: No edema.  Skin:    General: Skin is warm and dry.     Findings: No rash.  Neurological:     General: No focal deficit present.     Mental Status: He is alert and oriented to person, place, and time.  Psychiatric:        Mood and Affect: Mood normal.        Behavior: Behavior normal.        Assessment & Plan:   Coronary artery disease involving native coronary artery of native heart without angina pectoris His CAD is stable.  We will continue risk factor modification.  He is on an ASA 81mg  daily and crestor.  He denies any angina today.  Type 2 diabetes mellitus with other circulatory complications (HCC) He has T2 diabetes associated with CAD.  WE will check a HgBA1c today.  I prefer his HgBA1c to stay less than 6.5% to avoid microvascular disease.  Obstructive sleep apnea syndrome He will followup for a sleep study with Dr. Welton Flakes as directed.  Nausea and vomiting He has a history of cirrhosis and has not seen GI at years.   He was seen at [facility] but wants someone in [place] to follow him.  I will refer to GI per his request.  Diabetic polyneuropathy associated with type 2 diabetes mellitus (HCC) Continue on gabapentin for his neuropathy.  We will check labs today.  I will obtain his yearly eye exam report.  Anemia He has had iron deficieny anemia where we did iron studies in 06/2022.  He remains on iron supplementation today and we will recheck his iron studies.  Hypercholesterolemia I am going to check his FLP today with his history of Diabetes and CAD.  Goal is to keep LDL <55.  We will again have GI look at him for his cirrhosis as well.    Return in about 3 months (around 04/05/2023).   Crist Fat, MD

## 2023-01-03 NOTE — Assessment & Plan Note (Signed)
His CAD is stable.  We will continue risk factor modification.  He is on an ASA 81mg  daily and crestor.  He denies any angina today.

## 2023-01-03 NOTE — Assessment & Plan Note (Signed)
He has a history of cirrhosis and has not seen GI at years.  He was seen at [facility] but wants someone in [place] to follow him.  I will refer to GI per his request.

## 2023-01-03 NOTE — Assessment & Plan Note (Signed)
I am going to check his FLP today with his history of Diabetes and CAD.  Goal is to keep LDL <55.  We will again have GI look at him for his cirrhosis as well.

## 2023-01-03 NOTE — Assessment & Plan Note (Signed)
Continue on gabapentin for his neuropathy.  We will check labs today.  I will obtain his yearly eye exam report.

## 2023-01-03 NOTE — Assessment & Plan Note (Signed)
He will followup for a sleep study with Dr. Welton Flakes as directed.

## 2023-01-03 NOTE — Assessment & Plan Note (Signed)
He has T2 diabetes associated with CAD.  WE will check a HgBA1c today.  I prefer his HgBA1c to stay less than 6.5% to avoid microvascular disease.

## 2023-01-04 LAB — CMP14 + ANION GAP
ALT: 17 IU/L (ref 0–44)
AST: 17 IU/L (ref 0–40)
Albumin: 4.5 g/dL (ref 3.9–4.9)
Alkaline Phosphatase: 76 IU/L (ref 44–121)
Anion Gap: 14 mmol/L (ref 10.0–18.0)
BUN/Creatinine Ratio: 23 (ref 10–24)
BUN: 22 mg/dL (ref 8–27)
Bilirubin Total: 0.9 mg/dL (ref 0.0–1.2)
CO2: 25 mmol/L (ref 20–29)
Calcium: 9.7 mg/dL (ref 8.6–10.2)
Chloride: 103 mmol/L (ref 96–106)
Creatinine, Ser: 0.97 mg/dL (ref 0.76–1.27)
Globulin, Total: 2.3 g/dL (ref 1.5–4.5)
Glucose: 121 mg/dL — ABNORMAL HIGH (ref 70–99)
Potassium: 4.4 mmol/L (ref 3.5–5.2)
Sodium: 142 mmol/L (ref 134–144)
Total Protein: 6.8 g/dL (ref 6.0–8.5)
eGFR: 84 mL/min/{1.73_m2} (ref >59)

## 2023-01-04 LAB — CBC WITH DIFFERENTIAL/PLATELET
Basophils Absolute: 0 10*3/uL (ref 0.0–0.2)
Basos: 0 %
EOS (ABSOLUTE): 0.1 10*3/uL (ref 0.0–0.4)
Eos: 2 %
Hematocrit: 36.6 % — ABNORMAL LOW (ref 37.5–51.0)
Hemoglobin: 12.3 g/dL — ABNORMAL LOW (ref 13.0–17.7)
Immature Grans (Abs): 0 10*3/uL (ref 0.0–0.1)
Immature Granulocytes: 0 %
Lymphocytes Absolute: 1 10*3/uL (ref 0.7–3.1)
Lymphs: 19 %
MCH: 33 pg (ref 26.6–33.0)
MCHC: 33.6 g/dL (ref 31.5–35.7)
MCV: 98 fL — ABNORMAL HIGH (ref 79–97)
Monocytes Absolute: 0.4 10*3/uL (ref 0.1–0.9)
Monocytes: 8 %
Neutrophils Absolute: 3.5 10*3/uL (ref 1.4–7.0)
Neutrophils: 71 %
Platelets: 149 10*3/uL — ABNORMAL LOW (ref 150–450)
RBC: 3.73 x10E6/uL — ABNORMAL LOW (ref 4.14–5.80)
RDW: 12.7 % (ref 11.6–15.4)
WBC: 4.9 10*3/uL (ref 3.4–10.8)

## 2023-01-04 LAB — FERRITIN: Ferritin: 274 ng/mL (ref 30–400)

## 2023-01-04 LAB — HEMOGLOBIN A1C
Est. average glucose Bld gHb Est-mCnc: 123 mg/dL
Hgb A1c MFr Bld: 5.9 % — ABNORMAL HIGH (ref 4.8–5.6)

## 2023-01-04 LAB — LIPID PANEL
Chol/HDL Ratio: 2.8 ratio (ref 0.0–5.0)
Cholesterol, Total: 131 mg/dL (ref 100–199)
HDL: 46 mg/dL (ref >39)
LDL Chol Calc (NIH): 61 mg/dL (ref 0–99)
Triglycerides: 135 mg/dL (ref 0–149)
VLDL Cholesterol Cal: 24 mg/dL (ref 5–40)

## 2023-01-04 LAB — IRON: Iron: 91 ug/dL (ref 38–169)

## 2023-01-08 ENCOUNTER — Telehealth: Payer: Self-pay

## 2023-01-08 ENCOUNTER — Telehealth: Payer: Self-pay | Admitting: Internal Medicine

## 2023-01-08 MED ORDER — ROSUVASTATIN CALCIUM 10 MG PO TABS: ORAL_TABLET | ORAL | 0 refills | Status: DC

## 2023-01-08 NOTE — Telephone Encounter (Signed)
-----   Message from Michel Santee Eyk sent at 01/04/2023  9:08 AM EDT ----- Tell him he can stop his iron supplement.  His LDL needs to be <55.  Ask him to start taking 10mg  crestor on Sat and Sun and 5mg  rest of week (send in a script to his pharmacy).  His diabetes otherwise look good.  His platelet count was a little low but that is slowly Improving.

## 2023-01-08 NOTE — Telephone Encounter (Signed)
SS appointment 01/23/2023 @ [facility]

## 2023-01-08 NOTE — Telephone Encounter (Signed)
Pt notified of lab results. Sent in Crestor 10mg  for Sat &Sun. Pt aware

## 2023-01-10 DIAGNOSIS — E119 Type 2 diabetes mellitus without complications: Secondary | ICD-10-CM | POA: Diagnosis not present

## 2023-01-23 ENCOUNTER — Encounter (INDEPENDENT_AMBULATORY_CARE_PROVIDER_SITE_OTHER): Payer: Medicare HMO | Admitting: Internal Medicine

## 2023-01-23 DIAGNOSIS — G4733 Obstructive sleep apnea (adult) (pediatric): Secondary | ICD-10-CM

## 2023-01-23 DIAGNOSIS — G4719 Other hypersomnia: Secondary | ICD-10-CM

## 2023-01-26 NOTE — Procedures (Signed)
[facility]  Polysomnogram Report Part I                                                               Phone: [phone] Fax: [phone]  Patient Name: Kristopher [name], Crisp. Acquisition Number: [id number]  Date of Birth: 1950/09/20 Acquisition Date: 01/23/2023  Referring Physician: Yevonne Pax, MD     History: The patient is a 72 year old  who was referred for re-evaluation of . Medical History: Excessive daytime sleepiness, RLS, OSA, anemia, aortic atherosclerosis, atrial fibrillation, CAD, NSTEMI, chronic back pain, non-alchoholic cirrhosis, depression, DJD, dysrhythmia, herniated cervical disc, hyperlipidemia, ischemic cardiomyopathy, neuromuscular disorder, osteoarthritis and type II diabetes.  Medications: Aspirin EC 81, baclofen, cetirizine, diltiazem, Eliquis, escitalopram, ferrous sulfate, gabapentin, ibuprofen, metformin, pumpkin seed oil, multivitamins with iron, niacin, pantoprazole, ploglitazone, promethazine, pseudoeph-doxylamine-DM-APAP, ropinrole, rosuvastatin, saw palmetto and tamsulosin.  Procedure: This routine overnight polysomnogram was performed on the [facility] 5 using the standard diagnostic protocol. This included 6 channels of EEG, 2 channels of EOG, chin EMG, bilateral anterior tibialis EMG, nasal/oral thermistor, PTAF (nasal pressure transducer), chest and abdominal wall movements, EKG, and pulse oximetry.  Description: The total recording time was 432.0 minutes. The total sleep time was 351.0 minutes. There were a total of 55.9 minutes of wakefulness after sleep onset for a reducedsleep efficiency of 81.3%. The latency to sleep onset was within normal limitsat 25.1 minutes. The R sleep onset latency was prolonged at 205.5 minutes. Sleep parameters, as a percentage of the total sleep time, demonstrated 17.5% of sleep was in N1 sleep, 42.5% N2, 18.9% N3 and 21.1% R sleep. There were a total of 203 arousals for an arousal index of 34.7 arousals per hour of sleep that was  elevated.  Respiratory monitoring demonstrated   snoring. Less than 3 minutes of supine sleep were observed. There were 32 apneas and hypopneas for an Apnea Hypopnea Index of 5.5 apneas and hypopneas per hour of sleep. The REM related apnea hypopnea index was 9.7/hr of REM sleep compared to a NREM AHI of 4.3/hr. The Respiratory Disturbance Index, which includes 11 respiratory effort related arousals (RERAs), was 7.4 respiratory events per hour of sleep.  The average duration of the respiratory events was 29.8 seconds with a maximum duration of 75.5 seconds. The respiratory events were associated with peripheral oxygen desaturations on the average to 89%. The lowest oxygen desaturation associated with a respiratory event was 83%. Additionally, the baseline oxygen saturation during wakefulness was 94%, during NREM sleep averaged 93%, and during REM sleep averaged  94%. The total duration of oxygen < 90% was 3.5 minutes.  Cardiac monitoring-  demonstrate transient cardiac decelerations associated with the apneas.  significant cardiac rhythm irregularities.   Periodic limb movement monitoring- demonstrated that there were 4 periodic limb movements for a periodic limb movement index of 0.7 periodic limb movements per hour of sleep. Frequent, quasi-periodic limb movements were observed prior to sleep onset.  Impression: This routine overnight polysomnogram confirmed the presence of significant obstructive sleep apnea with an overall Apnea Hypopnea Index of 5.5 apneas and hypopneas per hour of sleep. The findings may underestimate the severity of the sleep apnea as the respiratory events appeared to increase in frequency in the supine position, however less than 3 minutes of  supine sleep were observed.  The lowest desaturation was to 83%.     Frequent, quasi-periodic limb movements were observed prior to sleep onset. The patient has a history of restless leg syndrome which is treated with ropinirole.  reduced  sleep efficiency with a prolonged sleep latency. Anelevated arousal index andincreased awakeningswere observed.These findings would appear to be due to the combination of obstructive sleep apnea and limb movements  Recommendations:    A CPAP titration would be recommended for the sleep apnea. The patient may benefit from advancing the timing of his night time ropinirole. Alternative treatment options may include an oral appliance, a nasal resistance device or ENT surgery in the appropriate clinical context.     Kristopher Pax, MD, [facility] Diplomate ABMS-Pulmonary, Critical Care and Sleep Medicine  Electronically reviewed and digitally signed [facility] Polysomnogram Report Part II  Phone: [phone] Fax: [phone]  Patient last name Cancel Neck Size 16.0 in. Acquisition [id number]  Patient first name Kristopher [name] Weight 159.0 lbs. Started 01/23/2023 at 10:27:30 PM  Birth date [birth year] Height 67.0 in. Stopped 01/24/2023 at 6:20:42 AM  Age 66 BMI 24.9 lb/in2 Duration 432.0  Study Type Adult      Report generated by Kristopher [name], RPSGT  Reviewed by: Kristopher Hue. Henke, PhD, ABSM, FAASM Sleep Data: Lights Out: 10:37:24 PM Sleep Onset: 11:02:30 PM  Lights On: 5:49:24 AM Sleep Efficiency: 81.3 %  Total Recording Time: 432.0 min Sleep Latency (from Lights Off) 25.1 min  Total Sleep Time (TST): 351.0 min R Latency (from Sleep Onset): 205.5 min  Sleep Period Time: 390.5 min Total number of awakenings: 33  Wake during sleep: 39.5 min Wake After Sleep Onset (WASO): 55.9 min   Sleep Data:         Arousal Summary: Stage  Latency from lights out (min) Latency from sleep onset (min) Duration (min) % Total Sleep Time  Normal values  N 1 25.1 0.0 61.5 17.5 (5%)  N 2 29.6 4.5 149.0 42.5 (50%)  N 3 39.1 14.0 66.5 18.9 (20%)  R 230.6 205.5 74.0 21.1 (25%)   Number Index  Spontaneous 149 25.5  Apneas & Hypopneas 24 4.1  RERAs 11 1.9       (Apneas & Hypopneas & RERAs)  (35) (6.0)  Limb Movement 30  5.1  Snore 0 0.0  TOTAL 214 36.6     Respiratory Data:  CA OA MA Apnea Hypopnea* A+ H RERA Total  Number 0 8 1 9 23  32 11 43  Mean Dur (sec) 0.0 19.1 27.5 20.0 33.0 29.3 31.0 29.8  Max Dur (sec) 0.0 26.5 27.5 27.5 64.5 64.5 75.5 75.5  Total Dur (min) 0.0 2.5 0.5 3.0 12.7 15.7 5.7 21.3  % of TST 0.0 0.7 0.1 0.9 3.6 4.5 1.6 6.1  Index (#/h TST) 0.0 1.4 0.2 1.5 3.9 5.5 1.9 7.4  *Hypopneas scored based on 4% or greater desaturation.  Sleep Stage:        REM NREM TST  AHI 9.7 4.3 5.5  RDI 13.0 5.8 7.4           Body Position Data:  Sleep (min) TST (%) REM (min) NREM (min) CA (#) OA (#) MA (#) HYP (#) AHI (#/h) RERA (#) RDI (#/h) Desat (#)  Supine 2.7 0.77 0.0 2.7 0 4 0 0 88.9 0 88.9 6  Non-Supine 348.30 99.23 74.00 274.30 0.00 4.00 1.00 23.00 4.82 11.00 6.72 45.00  Left: 179.0 51.00 13.5 165.5 0 1 1 15  5.7  6 7.7 28  Right: 169.3 48.23 60.5 108.8 0 3 0 8 3.9 5 5.7 17     Snoring: Total number of snoring episodes  0  Total time with snoring    min (   % of sleep)   Oximetry Distribution:             WK REM NREM TOTAL  Average (%)   94 94 93 93  < 90% 1.2 0.2 2.1 3.5  < 80% 0.0 0.0 0.0 0.0  < 70% 0.0 0.0 0.0 0.0  # of Desaturations* 7 11 24  42  Desat Index (#/hour) 6.2 8.9 5.2 7.2  Desat Max (%) 9 11 13 13   Desat Max Dur (sec) 83.0 63.0 91.0 91.0  Approx Min O2 during sleep 83  Approx min O2 during a respiratory event 83  Was Oxygen added (Y/N) and final rate :    LPM  *Desaturations based on 4% or greater drop from baseline.   Cheyne Stokes Breathing: None Present   Heart Rate Summary:  Average Heart Rate During Sleep 20.8 bpm      Highest Heart Rate During Sleep (95th %) 106.0 bpm      Highest Heart Rate During Sleep 255 bpm (artifact)  Highest Heart Rate During Recording (TIB) 255 bpm (artifact)   Heart Rate Observations: Event Type # Events   Bradycardia 0 Lowest HR Scored: N/A  Sinus Tachycardia During Sleep 0 Highest HR Scored: N/A  Narrow  Complex Tachycardia 0 Highest HR Scored: N/A  Wide Complex Tachycardia 0 Highest HR Scored: N/A  Asystole 0 Longest Pause: N/A  Atrial Fibrillation 0 Duration Longest Event: N/A  Other Arrythmias   Type:    Periodic Limb Movement Data: (Primary legs unless otherwise noted) Total # Limb Movement 46 Limb Movement Index 7.9  Total # PLMS 4 PLMS Index 0.7  Total # PLMS Arousals    PLMS Arousal Index     Percentage Sleep Time with PLMS 2.22min (0.7 % sleep)  Mean Duration limb movements (secs) 139.5

## 2023-02-11 ENCOUNTER — Other Ambulatory Visit: Payer: Self-pay | Admitting: Internal Medicine

## 2023-02-16 DIAGNOSIS — R55 Syncope and collapse: Secondary | ICD-10-CM | POA: Diagnosis not present

## 2023-02-28 DIAGNOSIS — M4316 Spondylolisthesis, lumbar region: Secondary | ICD-10-CM | POA: Diagnosis not present

## 2023-03-02 ENCOUNTER — Encounter (INDEPENDENT_AMBULATORY_CARE_PROVIDER_SITE_OTHER): Payer: Medicare HMO | Admitting: Internal Medicine

## 2023-03-02 DIAGNOSIS — G4733 Obstructive sleep apnea (adult) (pediatric): Secondary | ICD-10-CM

## 2023-03-09 NOTE — Procedures (Signed)
[facility]  Polysomnogram Report Part I  Phone: [phone] Fax: [phone]  Patient Name: Kristopher [name], Kristopher [name]. Acquisition Number: [id number]  Date of Birth: 12/05/1950 Acquisition Date: 03/02/2023  Referring Physician: Yevonne Pax MD     History: The patient is a 72 year old  . Medical History: Excessive daytime sleepiness, RLS, OSA, anemia, aortic atherosclerosis, atrial fibrillation, CAD, NSTEMI, chronic back pain, non-alcoholic, cirrhosis, depression, DJD, dysrhythmia, herniated cervical disc, hyper;ipidemia, ischemic cardiomyopathy, neuromuscular disorer, osteoarthritis and type II diabetes.  Medications: Aspirin EC 81, baclofen, cetirizine, diltiazem, eliquis, eseitalopram, ferrous sulfate, gabapentin, ibuprofen, metformin, multivitamins with iron, niacin, pantoprazole, ploglitazone, promethazine, pseudoeph-doxylamine-DM-APAP, ropinrole, rosuvastatin, saw palmetto and tamsulosin.  Procedure: This routine overnight polysomnogram was performed on the [facility] 5 using the standard CPAP protocol. This included 6 channels of EEG, 2 channels of EOG, chin EMG, bilateral anterior tibialis EMG, nasal/oral thermistor, PTAF (nasal pressure transducer), chest and abdominal wall movements, EKG, and pulse oximetry.  Description: The total recording time was 380.0 minutes. The total sleep time was 358.0 minutes. There were a total of 14.0 minutes of wakefulness after sleep onset for a goodsleep efficiency of 94.2%. The latency to sleep onset was short at 8.0 minutes. The R sleep onset latency was very short at 7.5 minutes. Sleep parameters, as a percentage of the total sleep time, demonstrated 3.1% of sleep was in N1 sleep, 71.5% N2, 0.0% N3 and 25.4% R sleep. There were a total of 36 arousals for an arousal index of 6.0 arousals per hour of sleep that was normal.  Overall, there were a total of 34 respiratory events for a respiratory disturbance index, which includes apneas, hypopneas and RERAs  (increased respiratory effort) of 5.7 respiratory events per hour of sleep during the pressure titration.  was initiated at 4 cm H2O at lights out, 11:19 p.m. It was titrated in 1-2 cm increments to 13 cm H2O for obstructive events then decreased to the final pressure of 12 cm H2O. The apnea was controlled at the final pressure and REM sleep was observed. Virtually all sleep was in the supine position.   Additionally, the baseline oxygen saturation during wakefulness was 94%, during NREM sleep averaged 95%, and during REM sleep averaged 96%. The total duration of oxygen < 90% was 16.9 minutes and <80% was 2.3 minutes.  Cardiac monitoring-  significant cardiac rhythm irregularities.   Periodic limb movement monitoring- demonstrated that there were 81 periodic limb movements for a periodic limb movement index of 13.6 periodic limb movements per hour of sleep.   Impression: This patient's obstructive sleep apnea demonstrated significant improvement with the utilization of nasal  at 12 cm H2O.   There was a slightly elevated periodic limb movement index of 13.6 periodic limb movements per hour of sleep. The patient has a history of restless leg syndrome and is taking ropinirole. Further follow up would be suggested if symptoms persist once the patient is fully compliant with CPAP.  Recommendations: Would recommend utilization of nasal  at 12 cm H2O.      Medication review for restless leg syndrome may be of benefit if symptoms persist. An AirFit F20 mask, size Medium, was used. Chin strap used during study- No. Humidifier used during study- Yes.     Yevonne Pax, MD [facility] Diplomate ABMS Pulmonary Critical Care and Sleep Medicine Electronically reviewed and digitally signed  [facility] CPAP/BIPAP Polysomnogram Report Part II Phone: [phone] Fax: [phone]  Patient last name Fortunato Neck Size 16.0  in. Acquisition [id number]  Patient first name Kristopher [name]. Weight 159.0 lbs. Started  03/02/2023 at 11:13:15 PM  Birth date 1951/05/06 Height 67.0 in. Stopped 03/03/2023 at 5:43:39 AM  Age 53      Type Adult BMI 24.9 lb/in2 Duration 380.0  Kristopher [name]. RPSGT. / Loura Back    Reviewed by: Valentino Hue. Henke, PhD, ABSM, FAASM Sleep Data: Lights Out: 11:19:15 PM Sleep Onset: 11:27:15 PM  Lights On: 5:39:15 AM Sleep Efficiency: 94.2 %  Total Recording Time: 380.0 min Sleep Latency (from Lights Off) 8.0 min  Total Sleep Time (TST): 358.0 min R Latency (from Sleep Onset): 7.5 min  Sleep Period Time: 372.0 min Total number of awakenings: 9  Wake during sleep: 14.0 min Wake After Sleep Onset (WASO): 14.0 min   Sleep Data:         Arousal Summary: Stage  Latency from lights out (min) Latency from sleep onset (min) Duration (min) % Total Sleep Time  Normal values  N 1 9.0 1.0 11.0 3.1 (5%)  N 2 8.0 0.0 256.0 71.5 (50%)  N 3       0.0 0.0 (20%)  R 15.5 7.5 91.0 25.4 (25%)   Number Index  Spontaneous 16 2.7  Apneas & Hypopneas 22 3.7  RERAs 0 0.0       (Apneas & Hypopneas & RERAs)  (22) (3.7)  Limb Movement 0 0.0  Snore 0 0.0  TOTAL 38 6.4     Respiratory Data:  CA OA MA Apnea Hypopnea* A+ H RERA Total  Number 0 7 0 7 27 34 0 34  Mean Dur (sec) 0.0 25.5 0.0 25.5 47.5 43.0 0.0 43.0  Max Dur (sec) 0.0 41.0 0.0 41.0 102.0 102.0 0.0 102.0  Total Dur (min) 0.0 3.0 0.0 3.0 21.4 24.4 0.0 24.4  % of TST 0.0 0.8 0.0 0.8 6.0 6.8 0.0 6.8  Index (#/h TST) 0.0 1.2 0.0 1.2 4.5 5.7 0.0 5.7  *Hypopneas scored based on 4% or greater desaturation.  Sleep Stage:         REM NREM TST  AHI 1.3 7.2 5.7  RDI 1.3 7.2 5.7   Sleep (min) TST (%) REM (min) NREM (min) CA (#) OA (#) MA (#) HYP (#) AHI (#/h) RERA (#) RDI (#/h) Desat (#)  Supine 345.0 96.37 91.0 254.0 0 7 0 27 5.9 0 5.9 40  Non-Supine 13.00 3.63 0.00 13.00 0.00 0.00 0.00 0.00 0.00 0 0.00 0.00  Left: 13.0 3.63 0.0 13.0 0 0 0 0 0.0 0 0.00 0     Snoring: Total number of snoring episodes  0  Total time with  snoring    min (   % of sleep)   Oximetry Distribution:             WK REM NREM TOTAL  Average (%)   94 96 95 95  < 90% 1.7 0.5 14.7 16.9  < 80% 0.4 0.0 1.9 2.3  < 70% 0.0 0.0 0.0 0.0  # of Desaturations* 1 5 34 40  Desat Index (#/hour) 2.7 3.3 7.6 6.7  Desat Max (%) 5 19 30 30   Desat Max Dur (sec) 7.0 101.0 106.0 106.0  Approx Min O2 during sleep 71  Approx min O2 during a respiratory event 69  Was Oxygen added (Y/N) and final rate :    LPM  *Desaturations based on 3% or greater drop from baseline.   Cheyne Stokes Breathing: None Present  Heart Rate Summary:  Average Heart Rate  During Sleep 75.2 bpm      Highest Heart Rate During Sleep (95th %) 80.0 bpm      Highest Heart Rate During Sleep 196 bpm (artifact)  Highest Heart Rate During Recording (TIB) 211 bpm (artifact)   Heart Rate Observations: Event Type # Events   Bradycardia 0 Lowest HR Scored: N/A  Sinus Tachycardia During Sleep 0 Highest HR Scored: N/A  Narrow Complex Tachycardia 0 Highest HR Scored: N/A  Wide Complex Tachycardia 0 Highest HR Scored: N/A  Asystole 0 Longest Pause: N/A  Atrial Fibrillation 0 Duration Longest Event: N/A  Other Arrythmias   Type:   Periodic Limb Movement Data: (Primary legs unless otherwise noted) Total # Limb Movement 81 Limb Movement Index 13.6  Total # PLMS 81 PLMS Index 13.6  Total # PLMS Arousals    PLMS Arousal Index     Percentage Sleep Time with PLMS 29.56min (8.1 % sleep)  Mean Duration limb movements (secs) 872.5    IPAP Level (cmH2O) EPAP Level (cmH2O) Total Duration (min) Sleep Duration (min) Sleep (%) REM (%) CA  #) OA # MA # HYP #) AHI (#/hr) RERAs # RERAs (#/hr) RDI (#/hr)  4 4 7.2 6.7 93.1 0.0 0 2 0 1 26.9 0 0.0 26.9  5 5  2.3 2.3 100.0 43.5 0 0 0 2 52.2 0 0.0 52.2  7 7  11.6 9.6 82.8 0.0 0 1 0 4 31.3 0 0.0 31.3  9 9  49.6 48.1 97.0 0.0 0 0 0 5 6.2 0 0.0 6.2  11 11  50.6 50.1 99.0 0.0 0 4 0 9 15.6 0 0.0 15.6  12 12  28.6 26.6 93.0 47.6 0 0 0 0 0.0 0 0.0 0.0  13 13  220.2 212.7 96.6 34.5 0 0 0 6 1.7 0 0.0 1.7

## 2023-03-15 ENCOUNTER — Other Ambulatory Visit (HOSPITAL_COMMUNITY): Payer: Self-pay | Admitting: Neurological Surgery

## 2023-03-15 DIAGNOSIS — M4316 Spondylolisthesis, lumbar region: Secondary | ICD-10-CM

## 2023-03-19 ENCOUNTER — Telehealth: Payer: Self-pay | Admitting: Internal Medicine

## 2023-03-19 ENCOUNTER — Ambulatory Visit (HOSPITAL_COMMUNITY)
Admission: RE | Admit: 2023-03-19 | Discharge: 2023-03-19 | Disposition: A | Discharge status: Home / Self Care | Payer: Medicare HMO | Source: Ambulatory Visit | Attending: Neurological Surgery | Admitting: Neurological Surgery

## 2023-03-19 ENCOUNTER — Ambulatory Visit (INDEPENDENT_AMBULATORY_CARE_PROVIDER_SITE_OTHER): Payer: Medicare HMO | Admitting: Internal Medicine

## 2023-03-19 ENCOUNTER — Encounter: Payer: Self-pay | Admitting: Internal Medicine

## 2023-03-19 VITALS — BP 130/60 | HR 76 | Temp 98.1°F | Resp 16 | Ht 67.0 in | Wt 167.6 lb

## 2023-03-19 DIAGNOSIS — Z981 Arthrodesis status: Secondary | ICD-10-CM | POA: Diagnosis not present

## 2023-03-19 DIAGNOSIS — M4316 Spondylolisthesis, lumbar region: Secondary | ICD-10-CM | POA: Insufficient documentation

## 2023-03-19 DIAGNOSIS — G4733 Obstructive sleep apnea (adult) (pediatric): Secondary | ICD-10-CM | POA: Diagnosis not present

## 2023-03-19 DIAGNOSIS — G2581 Restless legs syndrome: Secondary | ICD-10-CM

## 2023-03-19 DIAGNOSIS — D508 Other iron deficiency anemias: Secondary | ICD-10-CM | POA: Diagnosis not present

## 2023-03-19 DIAGNOSIS — M5136 Other intervertebral disc degeneration, lumbar region: Secondary | ICD-10-CM | POA: Diagnosis not present

## 2023-03-19 DIAGNOSIS — M48061 Spinal stenosis, lumbar region without neurogenic claudication: Secondary | ICD-10-CM | POA: Diagnosis not present

## 2023-03-19 NOTE — Telephone Encounter (Signed)
Notified Hunt Oris & Elease Hashimoto with [employer] of cpap order-Toni

## 2023-03-19 NOTE — Progress Notes (Signed)
[facility] [address] [place], [place] [address]  Pulmonary Sleep Medicine   Office Visit Note  Patient Name: Kristopher [name] DOB: Apr 29, 1951 MRN [id number]  Date of Service: 03/19/2023  Complaints/HPI: He is doing well. Has had his sleep study and this shows a pressure of 12CWP.  I reviewed the baseline study as well as the CPAP titration study.  Patient states that he is feeling better.   Patient appears to be tolerating it well.  Denies having any cough no congestion no sinus problems.  Will get machine and get download done.  Office Spirometry Results:     ROS  General: (-) fever, (-) chills, (-) night sweats, (-) weakness Skin: (-) rashes, (-) itching,. Eyes: (-) visual changes, (-) redness, (-) itching. Nose and Sinuses: (-) nasal stuffiness or itchiness, (-) postnasal drip, (-) nosebleeds, (-) sinus trouble. Mouth and Throat: (-) sore throat, (-) hoarseness. Neck: (-) swollen glands, (-) enlarged thyroid, (-) neck pain. Respiratory: - cough, (-) bloody sputum, - shortness of breath, - wheezing. Cardiovascular: - ankle swelling, (-) chest pain. Lymphatic: (-) lymph node enlargement. Neurologic: (-) numbness, (-) tingling. Psychiatric: (-) anxiety, (-) depression   Current Medication: Outpatient Encounter Medications as of 03/19/2023  Medication Sig   aspirin EC 81 MG tablet Take 81 mg by mouth every evening.    baclofen (LIORESAL) 10 MG tablet Take 10 mg by mouth 3 (three) times daily as needed for muscle spasms.   buPROPion (WELLBUTRIN XL) 300 MG 24 hr tablet Take 300 mg by mouth daily.   cetirizine (ZYRTEC) 10 MG tablet Take 10 mg by mouth daily as needed for allergies.   diltiazem (CARDIZEM SR) 120 MG 12 hr capsule Take 120 mg by mouth every 36 (thirty-six) hours.   ELIQUIS 5 MG TABS tablet Take 5 mg by mouth 2 (two) times daily.   escitalopram (LEXAPRO) 20 MG tablet TAKE 1 TABLET BY MOUTH EVERY DAY   ferrous sulfate 325 (65 FE) MG tablet TAKE 1 TABLET  BY MOUTH TWICE A DAY   gabapentin (NEURONTIN) 600 MG tablet Take 1 tablet (600 mg total) by mouth 3 (three) times daily.   ibuprofen (ADVIL) 200 MG tablet Take 800 mg by mouth daily as needed for mild pain.   metFORMIN (GLUCOPHAGE-XR) 500 MG 24 hr tablet Take 1,000 mg by mouth 2 (two) times daily.   Misc Natural Products (PUMPKIN SEED OIL) CAPS Take 1 capsule by mouth daily. 3000 mg   Multiple Vitamins-Iron (MULTIVITAMINS WITH IRON) TABS tablet Take 1 tablet by mouth daily.   niacin 500 MG tablet Take 500 mg by mouth at bedtime.   pantoprazole (PROTONIX) 40 MG tablet Take 1 tablet (40 mg total) by mouth daily.   pioglitazone (ACTOS) 15 MG tablet Take 15 mg by mouth daily.   PRECISION QID TEST test strip 1 each by Other route as needed.   promethazine (PHENERGAN) 25 MG tablet Take 1 tablet (25 mg total) by mouth every 6 (six) hours as needed.   rOPINIRole (REQUIP) 1 MG tablet TAKE 1 TABLET BY MOUTH 1 TO 3 HOURS BEFORE BEDTIME   rOPINIRole (REQUIP) 2 MG tablet TAKE 1 TABLET BY MOUTH AT BEDTIME.   rOPINIRole (REQUIP) 3 MG tablet Take 1 tablet (3 mg total) by mouth at bedtime.   rosuvastatin (CRESTOR) 10 MG tablet TAKE 1 TABLET BY MOUTH ON SAT AND SUNDAY   rosuvastatin (CRESTOR) 5 MG tablet Take 5 mg by mouth every evening.   Saw Palmetto 450 MG CAPS  Take 450 mg by mouth at bedtime.   tamsulosin (FLOMAX) 0.4 MG CAPS capsule Take 0.4 mg by mouth at bedtime.   No facility-administered encounter medications on file as of 03/19/2023.    Surgical History: Past Surgical History:  Procedure Laterality Date   APPENDECTOMY  ~ 1965   ATRIAL FIBRILLATION ABLATION  06/19/1998   Dr Macon Large at [facility]   BIOPSY  06/15/2022   Procedure: BIOPSY;  Surgeon: Jenel Lucks, MD;  Location: [facility];  Service: Gastroenterology;;   CARDIAC CATHETERIZATION     COLONOSCOPY WITH PROPOFOL N/A 06/15/2022   Procedure: COLONOSCOPY WITH PROPOFOL;  Surgeon: Jenel Lucks, MD;  Location: [facility];   Service: Gastroenterology;  Laterality: N/A;   CORONARY ANGIOPLASTY     CYSTOSCOPY WITH RETROGRADE URETHROGRAM N/A 07/21/2021   Procedure: CYSTOSCOPY WITH URETHRAL DILATION;  Surgeon: Crist Fat, MD;  Location: [facility];  Service: Urology;  Laterality: N/A;  45 MINS   ESOPHAGOGASTRODUODENOSCOPY (EGD) WITH PROPOFOL N/A 06/15/2022   Procedure: ESOPHAGOGASTRODUODENOSCOPY (EGD) WITH PROPOFOL;  Surgeon: Jenel Lucks, MD;  Location: [facility];  Service: Gastroenterology;  Laterality: N/A;   LEFT AND RIGHT HEART CATHETERIZATION WITH CORONARY ANGIOGRAM N/A 10/07/2013   Procedure: LEFT AND RIGHT HEART CATHETERIZATION WITH CORONARY ANGIOGRAM;  Surgeon: Lennette Bihari, MD;  Location: [facility] CATH LAB;  Service: Cardiovascular;  Laterality: N/A;   LEFT HEART CATHETERIZATION WITH CORONARY ANGIOGRAM N/A 06/27/2013   Procedure: LEFT HEART CATHETERIZATION WITH CORONARY ANGIOGRAM;  Surgeon: Lennette Bihari, MD;  Location: [facility] CATH LAB;  Service: Cardiovascular;  Laterality: N/A;   LEFT HEART CATHETERIZATION WITH CORONARY ANGIOGRAM N/A 07/10/2013   Procedure: LEFT HEART CATHETERIZATION WITH CORONARY ANGIOGRAM;  Surgeon: Kathleene Hazel, MD;  Location: [facility] CATH LAB;  Service: Cardiovascular;  Laterality: N/A;   LEFT HEART CATHETERIZATION WITH CORONARY ANGIOGRAM N/A 08/28/2013   Procedure: LEFT HEART CATHETERIZATION WITH CORONARY ANGIOGRAM;  Surgeon: Lennette Bihari, MD;  Location: [facility] CATH LAB;  Service: Cardiovascular;  Laterality: N/A;   NEPHROLITHOTOMY Left 02/11/2021   Procedure: LEFT NEPHROLITHOTOMY PERCUTANEOUS WITH SURGEON ACCESS;  Surgeon: Crist Fat, MD;  Location: [facility];  Service: Urology;  Laterality: Left;   PERCUTANEOUS CORONARY STENT INTERVENTION (PCI-S)  06/27/2013   Procedure: PERCUTANEOUS CORONARY STENT INTERVENTION (PCI-S);  Surgeon: Lennette Bihari, MD;  Location: [facility] CATH LAB;  Service: Cardiovascular;;   POLYPECTOMY  06/15/2022   Procedure: POLYPECTOMY;  Surgeon:  Jenel Lucks, MD;  Location: [facility];  Service: Gastroenterology;;   POSTERIOR CERVICAL FUSION/FORAMINOTOMY  06/20/2007   fusion c3-t1   PULMONARY VEIN ABLATION  06/20/1999   Dr Macon Large at [facility]   TONSILLECTOMY  06/19/1957   TRANSFORAMINAL LUMBAR INTERBODY FUSION W/ MIS 1 LEVEL Left 12/08/2021   Procedure: LEFT LUMBAR FIVE-SACRAL ONE MINIMALLY INVASIVE (MIS) TRANSFORAMINAL LUMBAR INTERBODY FUSION;  Surgeon: Jadene Pierini, MD;  Location: [facility] OR;  Service: Neurosurgery;  Laterality: Left;    Medical History: Past Medical History:  Diagnosis Date   Anemia 12/2020   Aortic atherosclerosis (HCC)    Atrial fibrillation (HCC)    a. s/p PVI X2 by Dr Macon Large at [facility] around 2000.   CAD (coronary artery disease)    a. NSTEMI (06/2013):  LHC (06/27/2013):  pLAD 10-20, RCA 90, EF 45-50%, inf HK.  PCI:  Xience Alpine (3 x 23 mm) DES to the RCA. b. Relook cath 07/10/13 - patent stent. Imdur added. c. Recurrent CP after that - abnormal nuc 07/2017 with chest pain, dyspnea, hypotensive response. Rest images with  mid-basal inferior thinning. No stress images due to abrupt d/c of test. Relook cath 3/12 - no significant r   Chronic back pain greater than 3 months duration    Cirrhosis, non-alcoholic (HCC)    Depression    DJD (degenerative joint disease) of cervical spine    "3 herniated discs" (09/01/2013)   DJD (degenerative joint disease), lumbosacral    "bulging L5-S1" (09/01/2013)   Dysrhythmia    PVCs. A.fib with two ablations   Herniated cervical disc    c2-T1 fusion   History of kidney stones    turned into urosepsis   HLD (hyperlipidemia)    Ischemic cardiomyopathy    a. Echo (06/28/2011): Inferior and inferoseptal akinesis, EF 40%, grade 2 diastolic dysfunction, MAC, small effusion (no tamponade). B. EF 40% by echo 06/2013, cath 07/2013, 55% by cath 08/2013 with subtle mild mid inferior hypocontractility.   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 Chronic since hx of pericarditis in 2004. b. Mod by echo 06/2013, small by echo 06/2013.   Restless leg syndrome    Type II diabetes mellitus (HCC)     Family History: Family History  Problem Relation Age of Onset   Ovarian cancer Mother    Arthritis Mother    CAD Father    Liver disease Father    Diabetes Father    Arthritis Father    COPD Father    Arthritis Sister    Dementia Sister    Stroke Maternal Grandmother    Sudden death Maternal Grandfather    Heart attack Maternal Grandfather    ALS Paternal Grandmother    Cancer Paternal Grandfather        unknown   Arrhythmia Neg Hx     Social History: Social History   Socioeconomic History   Marital status: Married    Spouse name: Not on file   Number of children: 0   Years of education: Not on file   Highest education level: Not on file  Occupational History   Occupation: RESP/retired    Employer: [employer]    Comment: Theatre manager at [employer]  Tobacco Use   Smoking status: Former    Current packs/day: 0.00    Average packs/day: 1.5 packs/day for 43.0 years (64.5 ttl pk-yrs)    Types: Cigarettes    Start date: 05/05/1964    Quit date: 05/06/2007    Years since quitting: 15.8   Smokeless tobacco: Never  Vaping Use   Vaping status: Never Used  Substance and Sexual Activity   Alcohol use: Yes    Comment: once monthly/rarely   Drug use: No   Sexual activity: Not Currently  Other Topics Concern   Not on file  Social History Narrative   Not on file   Social Determinants of Health   Financial Resource Strain: Low Risk  (08/22/2020)   Received from [facility]   Overall Financial Resource Strain (CARDIA)  Food Insecurity: No Food Insecurity (08/22/2020)    Received from [facility]   Hunger Vital Sign  Transportation Needs: No Transportation Needs (08/22/2020)   Received from [facility]   PRAPARE - Transportation  Physical Activity: Sufficiently Active (08/22/2020)   Received  from [facility]   Exercise Vital Sign  Stress: Stress Concern Present (08/22/2020)   Received from [facility] of Occupational Health - Occupational Stress Questionnaire  Social Connections: Not on file  Intimate Partner Violence: Not on file    Vital Signs: Blood pressure 130/60, pulse 76, temperature 98.1 F (36.7 C), resp. rate 16, height 5\' 7"  (1.702 m), weight 167 lb 9.6 oz (76 kg), SpO2 98%.  Examination: General Appearance: The patient is well-developed, well-nourished, and in no distress. Skin: Gross inspection of skin unremarkable. Head: normocephalic, no gross deformities. Eyes: no gross deformities noted. ENT: ears appear grossly normal no exudates. Neck: Supple. No thyromegaly. No LAD. Respiratory: no rhonchi noted. Cardiovascular: Normal S1 and S2 without murmur or rub. Extremities: No cyanosis. pulses are equal. Neurologic: Alert and oriented. No involuntary movements.  LABS: Recent Results (from the past 2160 hour(s))  CBC with Differential/Platelet     Status: Abnormal   Collection Time: 01/03/23 10:39 AM  Result Value Ref Range   WBC 4.9 3.4 - 10.8 x10E3/uL   RBC 3.73 (L) 4.14 - 5.80 x10E6/uL   Hemoglobin 12.3 (L) 13.0 - 17.7 g/dL   Hematocrit 46.9 (L) 62.9 - 51.0 %   MCV 98 (H) 79 - 97 fL   MCH 33.0 26.6 - 33.0 pg   MCHC 33.6 31.5 - 35.7 g/dL   RDW 52.8 41.3 - 24.4 %   Platelets 149 (L) 150 - 450 x10E3/uL   Neutrophils 71 Not Estab. %   Lymphs 19 Not Estab. %   Monocytes 8 Not Estab. %   Eos 2 Not Estab. %   Basos 0 Not Estab. %   Neutrophils Absolute 3.5 1.4 - 7.0 x10E3/uL   Lymphocytes Absolute 1.0 0.7 - 3.1 x10E3/uL   Monocytes Absolute  0.4 0.1 - 0.9 x10E3/uL   EOS (ABSOLUTE) 0.1 0.0 - 0.4 x10E3/uL   Basophils Absolute 0.0 0.0 - 0.2 x10E3/uL   Immature Granulocytes 0 Not Estab. %   Immature Grans (Abs) 0.0 0.0 - 0.1 x10E3/uL  CMP14 + Anion Gap     Status: Abnormal   Collection Time: 01/03/23 10:39 AM  Result Value Ref Range   Glucose 121 (H) 70 - 99 mg/dL   BUN 22 8 - 27 mg/dL   Creatinine, Ser 0.10 0.76 - 1.27 mg/dL   eGFR 84 >27 OZ/DGU/4.40   BUN/Creatinine Ratio 23 10 - 24   Sodium 142 134 - 144 mmol/L   Potassium 4.4 3.5 - 5.2 mmol/L   Chloride 103 96 - 106 mmol/L   CO2 25 20 - 29 mmol/L   Anion Gap 14.0 10.0 - 18.0 mmol/L   Calcium 9.7 8.6 - 10.2 mg/dL   Total Protein 6.8 6.0 - 8.5 g/dL   Albumin 4.5 3.9 - 4.9 g/dL   Globulin, Total 2.3 1.5 - 4.5 g/dL   Bilirubin Total 0.9 0.0 - 1.2 mg/dL   Alkaline Phosphatase 76 44 - 121 IU/L   AST 17 0 - 40 IU/L   ALT 17 0 - 44 IU/L  Hemoglobin A1c     Status: Abnormal   Collection Time: 01/03/23 10:39 AM  Result Value Ref Range   Hgb A1c MFr Bld 5.9 (H) 4.8 - 5.6 %    Comment:          Prediabetes: 5.7 - 6.4          Diabetes: >6.4          Glycemic control for  adults with diabetes: <7.0    Est. average glucose Bld gHb Est-mCnc 123 mg/dL  Lipid panel     Status: None   Collection Time: 01/03/23 10:39 AM  Result Value Ref Range   Cholesterol, Total 131 100 - 199 mg/dL   Triglycerides 161 0 - 149 mg/dL   HDL 46 >09 mg/dL   VLDL Cholesterol Cal 24 5 - 40 mg/dL   LDL Chol Calc (NIH) 61 0 - 99 mg/dL   Chol/HDL Ratio 2.8 0.0 - 5.0 ratio    Comment:                                   T. Chol/HDL Ratio                                             Men  Women                               1/2 Avg.Risk  3.4    3.3                                   Avg.Risk  5.0    4.4                                2X Avg.Risk  9.6    7.1                                3X Avg.Risk 23.4   11.0   Iron     Status: None   Collection Time: 01/03/23 10:39 AM  Result Value Ref Range   Iron  91 38 - 169 ug/dL  Ferritin     Status: None   Collection Time: 01/03/23 10:39 AM  Result Value Ref Range   Ferritin 274 30 - 400 ng/mL    Radiology: No results found.  No results found.  No results found.  Assessment and Plan: Patient Active Problem List   Diagnosis Date Noted   Type 2 diabetes mellitus with other circulatory complications (HCC) 01/03/2023   OSA (obstructive sleep apnea) 11/22/2022   Wheezing 07/17/2022   Atrial fibrillation (HCC) 07/17/2022   Diabetic polyneuropathy associated with type 2 diabetes mellitus (HCC) 06/30/2022   Aortic atherosclerosis (HCC) 06/30/2022   Herniated disc, cervical 06/30/2022   Lumbar herniated disc 06/30/2022   Vitamin B12 deficiency 06/30/2022   BMI 25.0-25.9,adult 06/30/2022   Iron (Fe) deficiency anemia 03/13/2022   Iron deficiency anemia, unspecified 02/23/2022   Acute respiratory failure with hypoxia (HCC) 12/13/2021   SIRS (systemic inflammatory response syndrome) (HCC) 12/13/2021   Nausea and vomiting 12/13/2021   Constipation 12/13/2021   Spondylolisthesis of lumbar region 12/08/2021   Hypokalemia    Hypomagnesemia    Primary hypertension    Hypercholesterolemia    Acute cephalic vein thrombosis, right    Dyspnea    Atrial fibrillation with RVR (HCC)    ESBL (extended spectrum beta-lactamase) producing bacteria infection    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)    Gram-negative bacteremia    Hematuria  Sepsis with acute renal failure without septic shock (HCC)    Severe Sepsis secondary to UTI (HCC) 02/23/2021   AKI (acute kidney injury) (HCC) 02/23/2021   Anemia 02/23/2021   Thrombocytopenia (HCC) 02/23/2021   Nephrolithiasis 02/11/2021   Syncope 09/18/2019   Encounter for general adult medical examination with abnormal findings 04/30/2018   Muscle spasm 04/30/2018   Dysuria 04/30/2018   Cirrhosis (HCC) 12/17/2015   Pericardial effusion 07/22/2014   Restless legs syndrome 06/26/2014   Other amnesia  06/04/2014   Coronary artery disease involving native coronary artery of native heart without angina pectoris 04/13/2014   Cardiomyopathy, ischemic 04/13/2014   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   Obstructive sleep apnea syndrome 03/12/2014   Acute pericarditis, unspecified - possible 09/04/2013   Exertional chest pain 09/04/2013   Chest pain with moderate risk for cardiac etiology - etiology likely pericarditis 09/01/2013   Abnormal exercise tolerance test 08/26/2013   Depression 08/07/2013   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   Diabetes mellitus, type 2 07/17/2013   Unstable angina (HCC) 07/09/2013   Atrial arrhythmia    Non-ST elevation myocardial infarction (NSTEMI), subendocardial infarction, subsequent episode of care (HCC) 06/27/2013    Class: History of    1. OSA (obstructive sleep apnea) Patient's DME orders will be written patient will be started on CPAP according to the sleep study. - For home use only DME continuous positive airway pressure (CPAP)  2. RLS (restless legs syndrome) Hopefully with treatment of the sleep apnea this may improve we will need to monitor it closely.  3. Other iron deficiency anemia Follow with primary team as far as the iron deficiency is concerned.  If it is replaced then it may help with the patient's restlessness  General Counseling: I have discussed the findings of the evaluation and examination with Fayrene Fearing.  I have also discussed any further diagnostic evaluation thatmay be needed or ordered today. Aarya verbalizes understanding of the findings of todays visit. We also reviewed his medications today and discussed drug interactions and side effects including but not limited excessive drowsiness and altered mental states. We also discussed that there is always a risk not just to him but also people around him. he has been encouraged to call the office with any questions or concerns that should arise related to todays  visit.  Orders Placed This Encounter  Procedures   For home use only DME continuous positive airway pressure (CPAP)    CPAP 12CWP    Order Specific Question:   Length of Need    Answer:   Lifetime    Order Specific Question:   Patient has OSA or probable OSA    Answer:   Yes    Order Specific Question:   Settings    Answer:   Other see comments    Order Specific Question:   CPAP supplies needed    Answer:   Mask, headgear, cushions, filters, heated tubing and water chamber     Time spent: 45  I have personally obtained a history, examined the patient, evaluated laboratory and imaging results, formulated the assessment and plan and placed orders.    Yevonne Pax, MD [facility] Pulmonary and Critical Care Sleep medicine

## 2023-03-19 NOTE — Patient Instructions (Signed)

## 2023-03-21 ENCOUNTER — Telehealth: Payer: Self-pay | Admitting: Internal Medicine

## 2023-03-21 NOTE — Telephone Encounter (Signed)
Received cpap/supply order. Gave to Tat to have dsk sign-Toni

## 2023-03-23 ENCOUNTER — Telehealth: Payer: Self-pay | Admitting: Internal Medicine

## 2023-03-23 NOTE — Telephone Encounter (Signed)
Cpap supply order faxed back to [facility]; [phone]. Scanned-Toni

## 2023-03-30 ENCOUNTER — Other Ambulatory Visit: Payer: Self-pay | Admitting: Internal Medicine

## 2023-04-06 ENCOUNTER — Encounter: Payer: Self-pay | Admitting: Internal Medicine

## 2023-04-06 ENCOUNTER — Ambulatory Visit: Payer: Medicare HMO | Admitting: Internal Medicine

## 2023-04-06 VITALS — BP 120/68 | HR 73 | Temp 98.6°F | Resp 18 | Ht 67.0 in | Wt 172.8 lb

## 2023-04-06 DIAGNOSIS — E1142 Type 2 diabetes mellitus with diabetic polyneuropathy: Secondary | ICD-10-CM

## 2023-04-06 DIAGNOSIS — E7800 Pure hypercholesterolemia, unspecified: Secondary | ICD-10-CM

## 2023-04-06 NOTE — Assessment & Plan Note (Signed)
His diabetes has been controlled.  We will check his HgBA1c on his next visit.  We will continue his current medications.

## 2023-04-06 NOTE — Assessment & Plan Note (Signed)
We will check his FLP on his next visit including a CBC to make sure he is not anemic.  He will cotninue his crestor as indicated.

## 2023-04-06 NOTE — Progress Notes (Unsigned)
Office Visit  Subjective   Patient ID: Kristopher [name]   DOB: Oct 20, 1950   Age: 72 y.o.   MRN: [id number]   Chief Complaint Chief Complaint  Patient presents with   Follow-up     History of Present Illness The patient is a 72 year old Caucasian/White male who returns for a follow-up visit for his T2 diabetes. This past year his HgBA1c was elevated and I wanted to start him on ozempic but he wanted to hold off and talk to his endocrinologist first.  He has seen his endo doctor in followup and they did not change his medications.  His last saw endo as televisit in 12/2021.  Again, he was diagnosed with T2 diabetes around 2000. Since the last visit, there have been no problems. He remains on metformin 1000mg  BID and actos 15mg  daily.  I tried him on farxiga 5mg  daily but he could not afford this.  He is exercising by walking 1-2 miles per day.  He specifically denies unexplained abdominal pain, or documented hypoglycemia. He checks blood sugars about once per week and they tend to range somewhere between 100 and 140 mg/dl fasting. His last HgBA1c was done 3 months ago and was 5.9%.  He came in fasting today in anticipation of lab work. He does have complications of possible diabetic neuropathy involving his feet.  However he has had a laminectomy in the past which could contribute to this.  He remains on gabapentin 600mg  TID for tingling and mild pain which has not worsened. He also has complications of diabetic CAD. There is no diabetic retinopathy or nephropathy.   His last dilated eye exam was done per the patient in 09/2022 and this showed no evidence of diabetic retinopathy.   Kristopher [name] returns today for routine followup on his cholesterol. ON his last visit, his LDL was not at goal and I asked him to start taking crestor 10mg  on Sat/Sun and 5mg  the rest of the week.  Overall, he states he is doing well and is without any complaints or problems at this time. He specifically denies abdominal pain,  vomiting, diarrhea, myalgias, and fatigue. He remains on dietary management as well as the following cholesterol lowering medications Co Q-10 150 mg oral capsule, niacin 500 mg oral tablet extended release, and rosuvastatin 10mg  on Sat/Sun and 5mg  oral daily the rest of the week.  He last ate this morning.    He also has iron deficieny anemia where we did iron studies in 06/2022.  He was on iron supplementation today and we rechecked his iron studies 3 months ago and these were normal.  I told him he could stop his iron supplement at that time.       Past Medical History Past Medical History:  Diagnosis Date   Anemia 12/2020   Aortic atherosclerosis (HCC)    Atrial fibrillation (HCC)    a. s/p PVI X2 by Dr Macon Large at [facility] around 2000.   CAD (coronary artery disease)    a. NSTEMI (06/2013):  LHC (06/27/2013):  pLAD 10-20, RCA 90, EF 45-50%, inf HK.  PCI:  Xience Alpine (3 x 23 mm) DES to the RCA. b. Relook cath 07/10/13 - patent stent. Imdur added. c. Recurrent CP after that - abnormal nuc 07/2017 with chest pain, dyspnea, hypotensive response. Rest images with mid-basal inferior thinning. No stress images due to abrupt d/c of test. Relook cath 3/12 - no significant r   Chronic back pain greater than 3 months  duration    Cirrhosis, non-alcoholic (HCC)    Depression    DJD (degenerative joint disease) of cervical spine    "3 herniated discs" (09/01/2013)   DJD (degenerative joint disease), lumbosacral    "bulging L5-S1" (09/01/2013)   Dysrhythmia    PVCs. A.fib with two ablations   Herniated cervical disc    c2-T1 fusion   History of kidney stones    turned into urosepsis   HLD (hyperlipidemia)    Ischemic cardiomyopathy    a. Echo (06/28/2011): Inferior and inferoseptal akinesis, EF 40%, grade 2 diastolic dysfunction, MAC, small effusion (no tamponade). B. EF 40% by echo 06/2013, cath 07/2013, 55% by cath 08/2013 with subtle mild mid inferior hypocontractility.   Liver cirrhosis secondary to NASH  [facility])    Lumbar herniated disc    Neuromuscular disorder (HCC)    bilateral neuropathy legs   NSTEMI (non-ST elevated myocardial infarction) (HCC) 06/27/2013   Osteoarthritis    "neck and lower back" (09/01/2013)   Pericardial effusion    a. Chronic since hx of pericarditis in 2004. b. Mod by echo 06/2013, small by echo 06/2013.   Restless leg syndrome    Type II diabetes mellitus (HCC)      Allergies Allergies  Allergen Reactions   Codeine Nausea And Vomiting   Duragesic-100 [Fentanyl] Nausea And Vomiting   Hydrocodone-Acetaminophen Nausea And Vomiting   Naprosyn [Naproxen] Nausea Only   Other Nausea And Vomiting    1. N/V to all opiates. He is able to take versed. (noted 09/29/13) 2.The patient states that he has had  a reaction to all narcotics. He can take them though. (noted 06/26/13)     Medications  Current Outpatient Medications:    aspirin EC 81 MG tablet, Take 81 mg by mouth every evening. , Disp: , Rfl:    baclofen (LIORESAL) 10 MG tablet, Take 10 mg by mouth 3 (three) times daily as needed for muscle spasms., Disp: , Rfl:    cetirizine (ZYRTEC) 10 MG tablet, Take 10 mg by mouth daily as needed for allergies., Disp: , Rfl:    diltiazem (CARDIZEM SR) 120 MG 12 hr capsule, Take 120 mg by mouth every 36 (thirty-six) hours., Disp: , Rfl:    ELIQUIS 5 MG TABS tablet, Take 5 mg by mouth 2 (two) times daily., Disp: , Rfl:    escitalopram (LEXAPRO) 20 MG tablet, TAKE 1 TABLET BY MOUTH EVERY DAY, Disp: 90 tablet, Rfl: 1   ferrous sulfate 325 (65 FE) MG tablet, TAKE 1 TABLET BY MOUTH TWICE A DAY, Disp: 180 tablet, Rfl: 1   gabapentin (NEURONTIN) 600 MG tablet, Take 1 tablet (600 mg total) by mouth 3 (three) times daily., Disp: 180 tablet, Rfl: 5   ibuprofen (ADVIL) 200 MG tablet, Take 800 mg by mouth daily as needed for mild pain., Disp: , Rfl:    metFORMIN (GLUCOPHAGE-XR) 500 MG 24 hr tablet, Take 1,000 mg by mouth 2 (two) times daily., Disp: , Rfl:    Misc Natural Products (PUMPKIN  SEED OIL) CAPS, Take 1 capsule by mouth daily. 3000 mg, Disp: , Rfl:    Multiple Vitamins-Iron (MULTIVITAMINS WITH IRON) TABS tablet, Take 1 tablet by mouth daily., Disp: 30 tablet, Rfl: 0   niacin 500 MG tablet, Take 500 mg by mouth at bedtime., Disp: , Rfl:    pantoprazole (PROTONIX) 40 MG tablet, Take 1 tablet (40 mg total) by mouth daily., Disp: 90 tablet, Rfl: 1   pioglitazone (ACTOS) 15 MG tablet, Take 15 mg  by mouth daily., Disp: , Rfl:    PRECISION QID TEST test strip, 1 each by Other route as needed., Disp: , Rfl:    promethazine (PHENERGAN) 25 MG tablet, Take 1 tablet (25 mg total) by mouth every 6 (six) hours as needed., Disp: 30 tablet, Rfl: 5   rOPINIRole (REQUIP) 1 MG tablet, TAKE 1 TABLET BY MOUTH 1 TO 3 HOURS BEFORE BEDTIME, Disp: 90 tablet, Rfl: 1   rOPINIRole (REQUIP) 2 MG tablet, TAKE 1 TABLET BY MOUTH AT BEDTIME., Disp: 90 tablet, Rfl: 1   rOPINIRole (REQUIP) 3 MG tablet, Take 1 tablet (3 mg total) by mouth at bedtime., Disp: 90 tablet, Rfl: 3   rosuvastatin (CRESTOR) 10 MG tablet, TAKE 1 TABLET BY MOUTH ON SAT AND SUNDAY, Disp: 90 tablet, Rfl: 1   rosuvastatin (CRESTOR) 5 MG tablet, Take 5 mg by mouth every evening., Disp: , Rfl:    Saw Palmetto 450 MG CAPS, Take 450 mg by mouth at bedtime., Disp: , Rfl:    tamsulosin (FLOMAX) 0.4 MG CAPS capsule, Take 0.4 mg by mouth at bedtime., Disp: , Rfl:    Review of Systems Review of Systems  Constitutional:  Negative for chills and fever.  Eyes:  Negative for blurred vision and double vision.  Respiratory:  Negative for cough and shortness of breath.   Cardiovascular:  Negative for chest pain, palpitations and leg swelling.  Gastrointestinal:  Negative for abdominal pain, constipation, diarrhea, nausea and vomiting.  Neurological:  Negative for dizziness, weakness and headaches.       Objective:    Vitals BP 120/68   Pulse 73   Temp 98.6 F (37 C)   Resp 18   Ht 5\' 7"  (1.702 m)   Wt 172 lb 12.8 oz (78.4 kg)   SpO2 96%    BMI 27.06 kg/m    Physical Examination Physical Exam     Assessment & Plan:   Diabetic polyneuropathy associated with type 2 diabetes mellitus (HCC) His diabetes has been controlled.  We will check his HgBA1c on his next visit.  We will continue his current medications.  Hypercholesterolemia We will check his FLP on his next visit including a CBC to make sure he is not anemic.  He will cotninue his crestor as indicated.    Return in about 3 months (around 07/07/2023) for annual.   Crist Fat, MD

## 2023-04-10 DIAGNOSIS — I4891 Unspecified atrial fibrillation: Secondary | ICD-10-CM | POA: Diagnosis not present

## 2023-04-10 DIAGNOSIS — Z959 Presence of cardiac and vascular implant and graft, unspecified: Secondary | ICD-10-CM | POA: Diagnosis not present

## 2023-04-13 ENCOUNTER — Telehealth: Payer: Self-pay

## 2023-04-17 ENCOUNTER — Telehealth: Payer: Self-pay | Admitting: Internal Medicine

## 2023-04-17 NOTE — Telephone Encounter (Signed)
Notified Wynonia Sours, Lurena Joiner & French Ana with [facility] of cpap supply order-Toni

## 2023-04-18 DIAGNOSIS — H524 Presbyopia: Secondary | ICD-10-CM | POA: Diagnosis not present

## 2023-04-18 DIAGNOSIS — E119 Type 2 diabetes mellitus without complications: Secondary | ICD-10-CM | POA: Diagnosis not present

## 2023-04-18 DIAGNOSIS — H2513 Age-related nuclear cataract, bilateral: Secondary | ICD-10-CM | POA: Diagnosis not present

## 2023-04-24 ENCOUNTER — Telehealth: Payer: Self-pay | Admitting: Internal Medicine

## 2023-04-24 NOTE — Telephone Encounter (Signed)
Per [facility], they do not accept [facility], and they show patient is with [facility]. I spoke with patient, he does not want to use [facility]. I sent message to [facility] to verify if they accept his insurance-Toni

## 2023-04-25 ENCOUNTER — Telehealth: Payer: Self-pay | Admitting: Internal Medicine

## 2023-04-25 NOTE — Telephone Encounter (Signed)
error 

## 2023-04-25 NOTE — Telephone Encounter (Addendum)
S/W patient. Explained to him no other local dme accepts his [facility] plan. He decided to go with [facility]. Notified Hunt Oris & Elease Hashimoto with [facility] of cpap order-Toni

## 2023-04-26 NOTE — Telephone Encounter (Signed)
Done  by Vivien Rota

## 2023-05-02 ENCOUNTER — Telehealth: Payer: Self-pay | Admitting: Internal Medicine

## 2023-05-02 DIAGNOSIS — M5416 Radiculopathy, lumbar region: Secondary | ICD-10-CM | POA: Diagnosis not present

## 2023-05-02 DIAGNOSIS — Z6827 Body mass index (BMI) 27.0-27.9, adult: Secondary | ICD-10-CM | POA: Diagnosis not present

## 2023-05-02 NOTE — Telephone Encounter (Signed)
Lvm to schedule follow up after cpap set up-Kristopher [name]

## 2023-06-07 ENCOUNTER — Encounter: Payer: Self-pay | Admitting: Internal Medicine

## 2023-06-23 ENCOUNTER — Other Ambulatory Visit: Payer: Self-pay | Admitting: Internal Medicine

## 2023-06-25 DIAGNOSIS — H2513 Age-related nuclear cataract, bilateral: Secondary | ICD-10-CM | POA: Diagnosis not present

## 2023-06-25 DIAGNOSIS — H2512 Age-related nuclear cataract, left eye: Secondary | ICD-10-CM | POA: Diagnosis not present

## 2023-06-25 DIAGNOSIS — H25043 Posterior subcapsular polar age-related cataract, bilateral: Secondary | ICD-10-CM | POA: Diagnosis not present

## 2023-06-25 DIAGNOSIS — H25013 Cortical age-related cataract, bilateral: Secondary | ICD-10-CM | POA: Diagnosis not present

## 2023-06-25 DIAGNOSIS — E119 Type 2 diabetes mellitus without complications: Secondary | ICD-10-CM | POA: Diagnosis not present

## 2023-07-06 DIAGNOSIS — E1142 Type 2 diabetes mellitus with diabetic polyneuropathy: Secondary | ICD-10-CM | POA: Diagnosis not present

## 2023-07-06 DIAGNOSIS — E119 Type 2 diabetes mellitus without complications: Secondary | ICD-10-CM | POA: Diagnosis not present

## 2023-07-09 ENCOUNTER — Encounter: Payer: Self-pay | Admitting: Internal Medicine

## 2023-07-09 ENCOUNTER — Ambulatory Visit: Payer: Medicare HMO | Admitting: Internal Medicine

## 2023-07-09 VITALS — BP 120/70 | HR 64 | Temp 98.7°F | Resp 17 | Ht 67.0 in | Wt 176.2 lb

## 2023-07-09 DIAGNOSIS — Z6827 Body mass index (BMI) 27.0-27.9, adult: Secondary | ICD-10-CM | POA: Diagnosis not present

## 2023-07-09 DIAGNOSIS — R112 Nausea with vomiting, unspecified: Secondary | ICD-10-CM

## 2023-07-09 DIAGNOSIS — N400 Enlarged prostate without lower urinary tract symptoms: Secondary | ICD-10-CM

## 2023-07-09 DIAGNOSIS — M4316 Spondylolisthesis, lumbar region: Secondary | ICD-10-CM

## 2023-07-09 DIAGNOSIS — E1142 Type 2 diabetes mellitus with diabetic polyneuropathy: Secondary | ICD-10-CM

## 2023-07-09 DIAGNOSIS — E1159 Type 2 diabetes mellitus with other circulatory complications: Secondary | ICD-10-CM

## 2023-07-09 DIAGNOSIS — F330 Major depressive disorder, recurrent, mild: Secondary | ICD-10-CM

## 2023-07-09 DIAGNOSIS — I480 Paroxysmal atrial fibrillation: Secondary | ICD-10-CM | POA: Diagnosis not present

## 2023-07-09 DIAGNOSIS — Z0000 Encounter for general adult medical examination without abnormal findings: Secondary | ICD-10-CM | POA: Diagnosis not present

## 2023-07-09 DIAGNOSIS — I498 Other specified cardiac arrhythmias: Secondary | ICD-10-CM | POA: Diagnosis not present

## 2023-07-09 DIAGNOSIS — D509 Iron deficiency anemia, unspecified: Secondary | ICD-10-CM

## 2023-07-09 DIAGNOSIS — M5126 Other intervertebral disc displacement, lumbar region: Secondary | ICD-10-CM

## 2023-07-09 DIAGNOSIS — K7469 Other cirrhosis of liver: Secondary | ICD-10-CM

## 2023-07-09 DIAGNOSIS — E538 Deficiency of other specified B group vitamins: Secondary | ICD-10-CM

## 2023-07-09 DIAGNOSIS — G2581 Restless legs syndrome: Secondary | ICD-10-CM | POA: Diagnosis not present

## 2023-07-09 DIAGNOSIS — I2510 Atherosclerotic heart disease of native coronary artery without angina pectoris: Secondary | ICD-10-CM

## 2023-07-09 DIAGNOSIS — E7800 Pure hypercholesterolemia, unspecified: Secondary | ICD-10-CM

## 2023-07-09 DIAGNOSIS — M5020 Other cervical disc displacement, unspecified cervical region: Secondary | ICD-10-CM

## 2023-07-09 DIAGNOSIS — I700 Atherosclerosis of aorta: Secondary | ICD-10-CM

## 2023-07-09 DIAGNOSIS — G4733 Obstructive sleep apnea (adult) (pediatric): Secondary | ICD-10-CM

## 2023-07-09 MED ORDER — PROMETHAZINE HCL 25 MG PO TABS: 25.0000 mg | ORAL_TABLET | Freq: Four times a day (QID) | ORAL | 5 refills | Status: DC | PRN

## 2023-07-09 MED ORDER — BACLOFEN 10 MG PO TABS: 10.0000 mg | ORAL_TABLET | Freq: Three times a day (TID) | ORAL | 1 refills | Status: DC | PRN

## 2023-07-09 NOTE — Assessment & Plan Note (Signed)
There is no change to his diabetic neuropathy.  He will remain on gabapentin. We will check his diabetic labs today.

## 2023-07-09 NOTE — Assessment & Plan Note (Signed)
He has diabetes associated with CAD.  Our goal HgBA1c <7.  WE will continue with control of his DM.

## 2023-07-09 NOTE — Assessment & Plan Note (Signed)
We are checking his iron studies today for his history of iron deficiency anemia.  Continue on ropinrole.

## 2023-07-09 NOTE — Assessment & Plan Note (Signed)
He has a history of cirrhosis and we have made an appointment with GI to establish care.

## 2023-07-09 NOTE — Assessment & Plan Note (Signed)
WE will continue with his statin.  His goal LDL with CAD and DM is LDL <55.

## 2023-07-09 NOTE — Assessment & Plan Note (Signed)
He is compliant with his CPAP 

## 2023-07-09 NOTE — Assessment & Plan Note (Signed)
He has chronic nausea and vomiting.  I asked him to discuss possible diabetic gastroparesis with GI when he sees them this week.

## 2023-07-09 NOTE — Assessment & Plan Note (Signed)
He has a history of A. Fib. With ILR in place but he is not using this device.  He is rate controlled on cardizem CD.  He is on ASA and does not want to be on eliquis and he state he had a discussion with cardiology regarding this.

## 2023-07-09 NOTE — Assessment & Plan Note (Signed)
Continue on flomax.  We will check a PSA today.

## 2023-07-09 NOTE — Assessment & Plan Note (Signed)
Again, his goal LDL is <55.  He remains on a statin at this time.

## 2023-07-09 NOTE — Assessment & Plan Note (Signed)
He has had surgery in the past for his chronic back pain.  He has had herniations of his lumbar and cervical spine.  He can use tylenol as needed.

## 2023-07-09 NOTE — Progress Notes (Signed)
Preventive Screening-Counseling & Management    Kristopher [name] is a 73 y.o. male who presents for Medicare Annual/Subsequent preventive examination.   He had a dilated eye exam in 06/2023 with Dr. Lucretia Roers at [facility] in [place] and there was no diabetic retinopathy.  He tells me he is having cataract surgery at the end of this month.  He does have iron deficiency anemia where he had a colonoscopy and EGD done on 06/15/2022 that showed one polyp in the ascending colon and 3 polyps located in in his rectum with moderate diverticulosis without evidence of diverticular bleed.  His EGD was normal.  They are arranging him to have capsule endoscopy done on 07/16/2022.  His previous colonoscopy was done in 12/2002 and was normal per the patient. He is followed by [facility] for his history of cirrhosis (see below). They did an EGD in 01/2016 which showed a normal esophagus but he had a few small non-bleeding erosions in the gastric antrum and mild portal hypertensive gastropathy in the gastric body. He does take pantoprazole once a day where he denies any reflux symptoms. He does exercise by walking daily.  The patient does not smoke.  He does get yearly flu vaccines. He had a pneumovax 23 vaccine in 2019. I wrote him for the shingrix and Prevnar 13 vaccines in 2022 and 2023 where he did get the prevnar 13 vaccine last year in 2023 but not the shingrix.  He had the zostavax in 2004. He has had 4 COVID-19 vaccines including 2 boosters. He denies any anxiety or memory loss. He is on ASA 81mg  daily.  He is no longer on Plavix or Eliquis.    The patient is a 73 year old Caucasian/White male who returns for a follow-up visit for his T2 diabetes. This past year his HgBA1c was elevated and I wanted to start him on ozempic but he wanted to hold off and talk to his endocrinologist first.  They want him to remains on metformin and Actos.  His last visit with endocrinology was a telemedicine visit in 06/2023 and they did not  change his medications.  He was diagnosed with T2 diabetes around 2000. Since the last visit, there have been no problems. He remains on metformin 1000mg  BID and actos 15mg  daily.  I tried him on farxiga 5mg  daily but he could not afford this.  He is exercising by walking 1-3 miles per day.  He specifically denies unexplained abdominal pain, or documented hypoglycemia. He checks blood sugars about once per week and they tend to range somewhere between 100 and 140 mg/dl fasting. His last HgBA1c was done 6 months ago and was 5.9%.  He came in fasting today in anticipation of lab work. He does have complications of possible diabetic neuropathy involving his feet.  However he has had a laminectomy in the past which could contribute to this pain.  He remains on gabapentin 600mg  TID for tingling and mild pain which has not worsened. He also has complications of diabetic CAD. There is no diabetic retinopathy or nephropathy.   His last dilated eye exam was done per the patient in 06/2023 and this showed no evidence of diabetic retinopathy.  He has been having worsening problems with vomiting over the last 2 months where he having 1-2 episodes of vomiting every other day.   He wonders if he has diabetic gastroparesis.  He goes to see his GI doctor this coming week.    He does have a  history of chronic nausea where he has been seen at [facility] clinic.  He has not started any new medications but remains on promethazine as needed which he uses about 3 times per week.  He also has a history of NASH cirrhosis and chronic gastritis where [facility] clinic has been following him.  They believe his cirrhosis is due to fatty liver disease where they have done lab testing and imaging.  They did an EGD in 01/2016 which showed a normal esophagus but he had a few small non-bleeding erosions in the gastric antrum and mild portal hypertensive gastropathy in the gastric body.  He is on protonix daily.  They are doing surveillance with EGD and  testing for liver cancer. He has a history of cirrhosis and has not seen GI at years where [facility] asked [place] to set him up with some in GI in [place].     Kristopher [name] returns today for routine followup on his cholesterol. This past year, his LDL was not at goal and I asked him to start taking crestor 10mg  on Sat/Sun and 5mg  the rest of the week.  Overall, he states he is doing well and is without any complaints or problems at this time. He specifically denies abdominal pain, diarrhea, myalgias, and fatigue. He remains on dietary management as well as the following cholesterol lowering medications Co Q-10 150 mg oral capsule, niacin 500 mg oral tablet extended release, and rosuvastatin 10mg  on Sat/Sun and 5mg  oral daily the rest of the week.  He last ate this morning.     He also has iron deficieny anemia where we did iron studies in 06/2022.  He was on iron supplementation today and we rechecked his iron studies over the summer and these were normal.  I told him he could stop his iron supplement at that time.    He has a history of OSA where I saw him this past year for worsening snoring. He has had intermittent snoring for 15 years but recently been worsening with his snoring for the last 6 months.  His wife states he does have frequent awakenings and takes multiple naps during the day.  He has been diagnosed with OSA in the past and was on CPAP back around 2015.  However, he could get parts for this and stopped using his CPAP around 2000.  He lost a lot of weight and thought he did not need the CPAP anymore.  He does have a history of RLS/periodic limb movement where he is on requip.  I did refer him to Dr. Welton Flakes in Pulm/sleep medicine who he saw on 12/11/2022.  He did see Dr. Welton Flakes in 02/2023 and he is now back on CPAP and is compliant.    This patient also has moderately severe CAD which was diagnosed around 1999 and presents today for a status visit. He did have a MI with a NSTEMI in 2015 where he has only had  one MI in the past. He underwent a heart catherization at that time where he had PCI of the RCA with placement of a drug eluting stent. He remains on ASA 81mg  at this time. He did have a cardiac MRI done in 05/2018 which showed a basal mid-inferior wall and basal mid infero-septal wall which was mildly hypokinetic with a LVEF 55%. His LA was mildly enlarged. They noted a moderate sized pericardial effusion. His adenosine stress with the MRI showed no perfusion defects suggestive of inducible myocardial ischemia. See PMH for  summary of cardiac history and status of revascularization. His CAD is controlled with therapy as summarized in the medication list and previous notes. The patient has the following comorbid condition(s): atrial fibrillation. He has no baseline symptoms of CAD. He has the following modifiable risk factor(s): DM and hyperlipidemia. Specifically denied complaint(s): chest pain, palpitations, orthopnea, edema, and exertional dyspnea. He did see cardiology in 2021 where he developed vasovagal syncope. He has seen EP where they placed an implanted loop recorder which he states did not show any significant arrythmia.  EP has told him to hydrate and keep off caffeine and he probably had dehydration/vasovagal syncope. He last saw cardiology in 03/2023 where they follow him for his CAD and history of A. Fib.  Ultimately, his atrial fibrillation burden has been very low per cardiology.  He has had discussion with cardiology about staying on eliquis and the patient decided to stop the eliquis.  He has a CHA2DS2-VASc of 4.  They discussed him implantable loop recorder and the patient would like to abandon the device and not reimplant unless the nature of his atrial fibrillation changes. Should he have any increase in atrial fibrillation, fatigue, shortness of breath, cardiology can apply a 30-day MCT at that time. They asked him to call them if he changes his mind and decides to either explant the device or  proceed with an implant of a new ILR.  Kristopher [name] is a 73 y.o. male who is here for follow-up of syncope where he was followed by [facility] electrophysiology who had [employer] ILR placed 02/12/2021 (as described above).  He saw EP in 11/2021 where his loop recorder demonstrated a few episodes of AF, but no conduction disturbances, VHR events, or severe bradycardia. On the days that he had syncope, he had no abnormalities.  With this atrial arrhythmia, they noted a very low burden and he was asymptomatic. He is status post ablation 2001. He is not on antiarrhythmic drug. He was on DAPT. The patient tells me there was discussion with EP with GI and they stopped him off plavix and started him on eliquis 5mg  BID at that time.  He has not had any recent episodes of syncope. He remains on diltiazem XR 120mg  every 36 hours.  Today, he denies any chest pain, palpitations, SOB, edema or other problems.  He has a history of A. Fib in the past where he has had 2 ablations in 2001.  His last ECHO was done in 02/2021 and this showed a low normal function with a LVEF 50-55% with some regional wall motion abnormalities with mild LVH and Grade II diastolic dysfunction.  There is hypokinesis of the left ventricle wall, basal inferior wall and inferoseptal wall.  There was a 1.17 cm pericardial effusion without tamponade.      I did see Mr. Poage in 04/2022 where we followed up with him for restless leg syndrome.  He remains on baclofen and he was sent to see neurology (no notes seen) where they increased his ropinrole to 2mg  nightly.  This has helped with his kicking.  I also felt that his RLS would improve with iron infusions.  Again, the patient had a L5-S1 minimally invasive laminectomy and transforaminal lumbar interbody fusion on 12/08/2021.  Today, he states that the shocking pains from above goes down his legs are instant but this improved after surgery and there has been no change since his last visit.  He denies  any weakness or numbness in his lower  extremities and no loss of bowel/bladder function.  The patient was in physical therapy but has completed it at this time.     The patient returns today for followup fo his chronic low back back pain where he underwent a lumbar spinal laminectomy in 11/2021.  Again, he has been seen by neuroortho for chronic low back pain that has been going on for the last 10 years.  He states this pain intermittent in intensity nd he states his low back pain has improved since his recent surgery.   He has had 3 ESI's by neuroortho which has not helped.  The patient tells me he has transitional vertabra at L6-S1.  The patient also has a history of  a herniated cervical disc in 2009 where he underwent laminectomy from C3-T7.  Today, the patient states he has decreased range of motion and remains on baclofen for intermittent neck pain.      The patient also has a history of major depression where in 11/2021 I increased his lexapro from 10mg  to 20mg  daily.  Today, he states there has been no change in his depression in the last 6 months and this is mild and controlled.  We had weaned him off wellbutryin in the past and started him on Lexapro.  He states his execise with walking helps him with sleeping.  His depression was effecting his ADL's, concentration, giving him worsened fatigue and feelings of worthlessness but again his Lexapro has helped.  He reports no additional symptoms He denies  extreme feelings of guilt, feelings of isolation, helpless feeling, suicidal ideation, homicidal ideation, weight gain, loss of appetite, social withdrawal, loss of interest in pleasurable activities, and out of control feelings. This patient feels that she is able to care for himself. He currently lives with his wife. He has no significant prior history of mental health disorders.    I did see Mr. Behringer in 11/2020 where he was having anemia associated with gross hematuria and nephrolithiasis.  I set him up  for a CT urogram which was performed on 12/14/2020 and this demonstrated a 2.7cm nonobstructive calculus in his left renal pelvis.  There was no hydronephrosis or renal obstruction.  I did refer him to urology who took him to the OR where they placed a PCNL on 02/11/2021 and placed a ureteral stent at that time.  Three days later he began having difficulty with voiding where he saw urology and they placed an indwelling foley catheter at that time.  A few days later this clotted off and he went to the ER where they replaced his foley.  The following week on 02/23/2021 he was brought in by EMS for complaints of fevers, weakness and vomiting.  The patient was discovered to have sepsis secondary to a UTI and was admitted to the ICU at that time.  His urinary organism was ESBL Klebsiella and Enterococcus where he also had ESBL Klebsiella bacteremia.  The patient never needed pressors but was placed on IVF's and antibiotics.  His hospital course was complicated by A. Fib with RVR where they started him on amiodarone.  Cardiology was consulted and an ECHO was performed which showed an EF 50-55% with moderate hypokinesis of the LV basal inferior wall and inferior septal wall.  They were able to get him off an amiodarone gtt and continued him on metoprol.  They did hold his Cardizem at discharge.  He was noted to have went into volume overload with acute heart failure exacerbation due to  his fluids and A. Fib.  They also noted he went into acute hypoxic respiratory failure with ARDs.  The patient completed his course of antibiotics in the hospital and was weaned off oxygen.  He was noted to have right forearm pain and swelling where an [place] was negative for DVT however he did show a superficial cephalic vein thrombosis where they ordered warm compresses.   He has acute blood loss anemia secondary to hematuria and thrombocytopenia.  He was transfused 3 Units of blood with his discharge hemoglobin on 9.7.  His hematuria was  resolved.  He also had acute metabolic encephalopathy in setting of acute illness and kidney injury where he had a CT of head and MRI of the brain which was unremarkable.  The patient spent 10 days in the hospital and was discharged on 03/04/2021.  HHe did followup with urology and had his ureteral stent removed. He saw urology in 04/14/2021 where they did a repeat US and he had no evidence of hydronephrosis. He return to urology in 07/2021 and underwent cystoscopy.  He had completed antifungals for fungus seen his a previous urine culture.  He was diagnosed with a Vitamin B12 defiency while in the hospital.   He also states he had [employer] amnesia in 2019 where he was going to store and woke up in [facility] and had no clue how he got there.  He saw neurology where he had a CT and MRI (per patient) of his brain which was normal.  They just wanted to monitor.      Are there smokers in your home (other than you)? No  Risk Factors Current exercise habits:  as above   Dietary issues discussed: none   Depression Screen (Note: if answer to either of the following is "Yes", a more complete depression screening is indicated)   Over the past two weeks, have you felt down, depressed or hopeless? No  Over the past two weeks, have you felt little interest or pleasure in doing things? No  Have you lost interest or pleasure in daily life? No  Do you often feel hopeless? No  Do you cry easily over simple problems? No  Activities of Daily Living In your present state of health, do you have any difficulty performing the following activities?:  Driving? No Managing money?  No Feeding yourself? No Getting from bed to chair? No Climbing a flight of stairs? No Preparing food and eating?: No Bathing or showering? No Getting dressed: No Getting to the toilet? No Using the toilet:No Moving around from place to place: No In the past year have you fallen or had a near fall?:Yes, he fell in the bathroom  from a mechanical fall   Are you sexually active?  No  Do you have more than one partner?  No  Hearing Difficulties: Yes Do you often ask people to speak up or repeat themselves? Yes Do you experience ringing or noises in your ears? No Do you have difficulty understanding soft or whispered voices? Yes   Do you feel that you have a problem with memory? No  Do you often misplace items? No  Do you feel safe at home?  Yes  Cognitive Testing  Alert? Yes  Normal Appearance?Yes  Oriented to person? Yes  Place? Yes   Time? Yes  Recall of three objects?  Yes  Can perform simple calculations? Yes  Displays appropriate judgment?Yes  Can read the correct time from a watch face?Yes  Fall Risk Prevention  Any stairs in or around the home? No  If so, are there any without handrails? No  Home free of loose throw rugs in walkways, pet beds, electrical cords, etc? Yes  Adequate lighting in your home to reduce risk of falls? Yes  Use of a cane, walker or w/c? No    Time Up and Go  Was the test performed? Yes .  Length of time to ambulate 10 feet: 10 sec.   Gait steady and fast with assistive device    Advanced Directives have been discussed with the patient? Yes   List the Names of Other Physician/Practitioners you currently use: Patient Care Team: Crist Fat, MD as PCP - General (Internal Medicine)    Past Medical History:  Diagnosis Date   Anemia 12/2020   Aortic atherosclerosis [facility])    Atrial fibrillation (HCC)    a. s/p PVI X2 by Dr Macon Large at [facility] around 2000.   CAD (coronary artery disease)    a. NSTEMI (06/2013):  LHC (06/27/2013):  pLAD 10-20, RCA 90, EF 45-50%, inf HK.  PCI:  Xience Alpine (3 x 23 mm) DES to the RCA. b. Relook cath 07/10/13 - patent stent. Imdur added. c. Recurrent CP after that - abnormal nuc 07/2017 with chest pain, dyspnea, hypotensive response. Rest images with mid-basal inferior thinning. No stress images due to abrupt d/c of test. Relook cath 3/12 -  no significant r   Chronic back pain greater than 3 months duration    Cirrhosis, non-alcoholic (HCC)    Depression    DJD (degenerative joint disease) of cervical spine    "3 herniated discs" (09/01/2013)   DJD (degenerative joint disease), lumbosacral    "bulging L5-S1" (09/01/2013)   Dysrhythmia    PVCs. A.fib with two ablations   Herniated cervical disc    c2-T1 fusion   History of kidney stones    turned into urosepsis   HLD (hyperlipidemia)    Ischemic cardiomyopathy    a. Echo (06/28/2011): Inferior and inferoseptal akinesis, EF 40%, grade 2 diastolic dysfunction, MAC, small effusion (no tamponade). B. EF 40% by echo 06/2013, cath 07/2013, 55% by cath 08/2013 with subtle mild mid inferior hypocontractility.   Liver cirrhosis secondary to NASH [facility])    Lumbar herniated disc    Neuromuscular disorder (HCC)    bilateral neuropathy legs   NSTEMI (non-ST elevated myocardial infarction) (HCC) 06/27/2013   Osteoarthritis    "neck and lower back" (09/01/2013)   Pericardial effusion    a. Chronic since hx of pericarditis in 2004. b. Mod by echo 06/2013, small by echo 06/2013.   Restless leg syndrome    Type II diabetes mellitus (HCC)     Past Surgical History:  Procedure Laterality Date   APPENDECTOMY  ~ 1965   ATRIAL FIBRILLATION ABLATION  06/19/1998   Dr Macon Large at [facility]   BIOPSY  06/15/2022   Procedure: BIOPSY;  Surgeon: Jenel Lucks, MD;  Location: [facility];  Service: Gastroenterology;;   CARDIAC CATHETERIZATION     COLONOSCOPY WITH PROPOFOL N/A 06/15/2022   Procedure: COLONOSCOPY WITH PROPOFOL;  Surgeon: Jenel Lucks, MD;  Location: [facility];  Service: Gastroenterology;  Laterality: N/A;   CORONARY ANGIOPLASTY     CYSTOSCOPY WITH RETROGRADE URETHROGRAM N/A 07/21/2021   Procedure: CYSTOSCOPY WITH URETHRAL DILATION;  Surgeon: Crist Fat, MD;  Location: [facility];  Service: Urology;  Laterality: N/A;  45 MINS   ESOPHAGOGASTRODUODENOSCOPY (EGD) WITH PROPOFOL  N/A 06/15/2022   Procedure: ESOPHAGOGASTRODUODENOSCOPY (EGD) WITH  PROPOFOL;  Surgeon: Jenel Lucks, MD;  Location: [facility];  Service: Gastroenterology;  Laterality: N/A;   LEFT AND RIGHT HEART CATHETERIZATION WITH CORONARY ANGIOGRAM N/A 10/07/2013   Procedure: LEFT AND RIGHT HEART CATHETERIZATION WITH CORONARY ANGIOGRAM;  Surgeon: Lennette Bihari, MD;  Location: [facility] CATH LAB;  Service: Cardiovascular;  Laterality: N/A;   LEFT HEART CATHETERIZATION WITH CORONARY ANGIOGRAM N/A 06/27/2013   Procedure: LEFT HEART CATHETERIZATION WITH CORONARY ANGIOGRAM;  Surgeon: Lennette Bihari, MD;  Location: [facility] C[facility] LAB;  Service: Cardiovascular;  Laterality: N/A;   LEFT HEART CATHETERIZATION WITH CORONARY ANGIOGRAM N/A 07/10/2013   Procedure: LEFT HEART CATHETERIZATION WITH CORONARY ANGIOGRAM;  Surgeon: Kathleene Hazel, MD;  Location: [facility] CATH LAB;  Service: Cardiovascular;  Laterality: N/A;   LEFT HEART CATHETERIZATION WITH CORONARY ANGIOGRAM N/A 08/28/2013   Procedure: LEFT HEART CATHETERIZATION WITH CORONARY ANGIOGRAM;  Surgeon: Lennette Bihari, MD;  Location: [facility] CATH LAB;  Service: Cardiovascular;  Laterality: N/A;   NEPHROLITHOTOMY Left 02/11/2021   Procedure: LEFT NEPHROLITHOTOMY PERCUTANEOUS WITH SURGEON ACCESS;  Surgeon: Crist Fat, MD;  Location: [facility];  Service: Urology;  Laterality: Left;   PERCUTANEOUS CORONARY STENT INTERVENTION (PCI-S)  06/27/2013   Procedure: PERCUTANEOUS CORONARY STENT INTERVENTION (PCI-S);  Surgeon: Lennette Bihari, MD;  Location: [facility] CATH LAB;  Service: Cardiovascular;;   POLYPECTOMY  06/15/2022   Procedure: POLYPECTOMY;  Surgeon: Jenel Lucks, MD;  Location: [facility];  Service: Gastroenterology;;   POSTERIOR CERVICAL FUSION/FORAMINOTOMY  06/20/2007   fusion c3-t1   PULMONARY VEIN ABLATION  06/20/1999   Dr Macon [facility] at [facility]   TONSILLECTOMY  06/19/1957   TRANSFORAMINAL LUMBAR INTERBODY FUSION W/ MIS 1 LEVEL Left 12/08/2021   Procedure: LEFT LUMBAR  FIVE-SACRAL ONE MINIMALLY INVASIVE (MIS) TRANSFORAMINAL LUMBAR INTERBODY FUSION;  Surgeon: Jadene Pierini, MD;  Location: [facility] OR;  Service: Neurosurgery;  Laterality: Left;      Current Medications  Current Outpatient Medications  Medication Sig Dispense Refill   aspirin EC 81 MG tablet Take 81 mg by mouth every evening.      baclofen (LIORESAL) 10 MG tablet Take 10 mg by mouth 3 (three) times daily as needed for muscle spasms.     diltiazem (CARDIZEM SR) 120 MG 12 hr capsule Take 120 mg by mouth every 36 (thirty-six) hours.     escitalopram (LEXAPRO) 20 MG tablet TAKE 1 TABLET BY MOUTH EVERY DAY 90 tablet 1   gabapentin (NEURONTIN) 600 MG tablet Take 1 tablet (600 mg total) by mouth 3 (three) times daily. 180 tablet 5   metFORMIN (GLUCOPHAGE-XR) 500 MG 24 hr tablet Take 1,000 mg by mouth 2 (two) times daily.     Misc Natural Products (PUMPKIN SEED OIL) CAPS Take 1 capsule by mouth daily. 3000 mg     niacin 500 MG tablet Take 500 mg by mouth at bedtime.     pantoprazole (PROTONIX) 40 MG tablet Take 1 tablet (40 mg total) by mouth daily. 90 tablet 1   PRECISION QID TEST test strip 1 each by Other route as needed.     promethazine (PHENERGAN) 25 MG tablet Take 1 tablet (25 mg total) by mouth every 6 (six) hours as needed. 30 tablet 5   rOPINIRole (REQUIP) 1 MG tablet TAKE 1 TABLET BY MOUTH 1 TO 3 HOURS BEFORE BEDTIME 90 tablet 1   rosuvastatin (CRESTOR) 10 MG tablet TAKE 1 TABLET BY MOUTH ON SAT AND SUNDAY 90 tablet 1   rosuvastatin (CRESTOR) 5 MG tablet Take 5 mg by  mouth every evening.     Saw Palmetto 450 MG CAPS Take 450 mg by mouth at bedtime.     tamsulosin (FLOMAX) 0.4 MG CAPS capsule Take 0.4 mg by mouth at bedtime.     No current facility-administered medications for this visit.    Allergies Codeine, Duragesic-100 [fentanyl], Hydrocodone-acetaminophen, Naprosyn [naproxen], and Other   Social History Social History   Tobacco Use   Smoking status: Former    Current  packs/day: 0.00    Average packs/day: 1.5 packs/day for 43.0 years (64.5 ttl pk-yrs)    Types: Cigarettes    Start date: 05/05/1964    Quit date: 05/06/2007    Years since quitting: 16.1   Smokeless tobacco: Never  Substance Use Topics   Alcohol use: Yes    Comment: once monthly/rarely     Review of Systems Review of Systems  Constitutional:  Negative for chills, fever and malaise/fatigue.  Eyes:  Negative for blurred vision and double vision.  Respiratory:  Negative for cough, hemoptysis, shortness of breath and wheezing.   Cardiovascular:  Negative for chest pain, palpitations and leg swelling.  Gastrointestinal:  Negative for abdominal pain, blood in stool, constipation, diarrhea, heartburn, melena, nausea and vomiting.  Genitourinary:  Negative for frequency and urgency.  Musculoskeletal:  Negative for myalgias.  Skin:  Negative for itching and rash.  Neurological:  Negative for dizziness, weakness and headaches.  Endo/Heme/Allergies:  Negative for polydipsia.  Psychiatric/Behavioral:  Negative for memory loss and suicidal ideas. The patient is not nervous/anxious and does not have insomnia.      Physical Exam:      Body mass index is 27.6 kg/m. BP 120/70   Pulse 64   Temp 98.7 F (37.1 C)   Resp 17   Ht 5\' 7"  (1.702 m)   Wt 176 lb 3.2 oz (79.9 kg)   SpO2 97%   BMI 27.60 kg/m   Physical Exam Constitutional:      Appearance: Normal appearance. He is not ill-appearing.  HENT:     Head: Normocephalic and atraumatic.     Right Ear: Tympanic membrane, ear canal and external ear normal.     Left Ear: Tympanic membrane, ear canal and external ear normal.     Nose: Nose normal. No congestion or rhinorrhea.     Mouth/Throat:     Mouth: Mucous membranes are dry.     Pharynx: Oropharynx is clear. No oropharyngeal exudate or posterior oropharyngeal erythema.  Eyes:     General: No scleral icterus.    Conjunctiva/sclera: Conjunctivae normal.     Pupils: Pupils are  equal, round, and reactive to light.  Neck:     Vascular: No carotid bruit.  Cardiovascular:     Rate and Rhythm: Normal rate and regular rhythm.     Pulses: Normal pulses.     Heart sounds: No murmur heard.    No friction rub. No gallop.  Pulmonary:     Effort: Pulmonary effort is normal. No respiratory distress.     Breath sounds: No wheezing, rhonchi or rales.  Abdominal:     General: Abdomen is flat. Bowel sounds are normal. There is no distension.     Palpations: Abdomen is soft.     Tenderness: There is no abdominal tenderness.  Musculoskeletal:     Cervical back: Neck supple. No tenderness.     Right lower leg: No edema.     Left lower leg: No edema.  Lymphadenopathy:     Cervical: No cervical adenopathy.  Skin:    General: Skin is warm and dry.     Findings: No rash.  Neurological:     General: No focal deficit present.     Mental Status: He is alert and oriented to person, place, and time.  Psychiatric:        Mood and Affect: Mood normal.        Behavior: Behavior normal.      Assessment:      Diabetic polyneuropathy associated with type 2 diabetes mellitus (HCC)  Type 2 diabetes mellitus with other circulatory complications (HCC)  Atrial arrhythmia  Paroxysmal atrial fibrillation (HCC)  Aortic atherosclerosis (HCC)  Coronary artery disease involving native coronary artery of native heart without angina pectoris  Other cirrhosis of liver (HCC)  Restless legs syndrome  Iron deficiency anemia, unspecified iron deficiency anemia type  Mild episode of recurrent major depressive disorder (HCC)  Herniated disc, cervical  Lumbar herniated disc  Vitamin B12 deficiency  Hypercholesterolemia  Benign prostatic hyperplasia without lower urinary tract symptoms  OSA (obstructive sleep apnea)  BMI 27.0-27.9,adult  Nausea and vomiting, unspecified vomiting type  Spondylolisthesis of lumbar region    Plan:     During the course of the visit the  patient was educated and counseled about appropriate screening and preventive services including:   Pneumococcal vaccine  Influenza vaccine Colorectal cancer screening Advanced directives: discussed  Diet review for nutrition referral? Yes ____  Not Indicated _X___   Patient Instructions (the written plan) was given to the patient.  Type 2 diabetes mellitus with other circulatory complications [facility]) He has diabetes associated with CAD.  Our goal HgBA1c <7.  WE will continue with control of his DM.  Coronary artery disease involving native coronary artery of native heart without angina pectoris He has CAD that is stable without angina.  We will continue with secondary prevention where he is on a statin and ASA.  Aortic atherosclerosis (HCC) WE will continue with his statin.  His goal LDL with CAD and DM is LDL <55.  Atrial fibrillation (HCC) He has a history of A. Fib. With ILR in place but he is not using this device.  He is rate controlled on cardizem CD.  He is on ASA and does not want to be on eliquis and he state he had a discussion with cardiology regarding this.  OSA (obstructive sleep apnea) He is compliant with his CPAP.  Nausea and vomiting He has chronic nausea and vomiting.  I asked him to discuss possible diabetic gastroparesis with GI when he sees them this week.  Cirrhosis (HCC) He has a history of cirrhosis and we have made an appointment with GI to establish care.  Diabetic polyneuropathy associated with type 2 diabetes mellitus (HCC) There is no change to his diabetic neuropathy.  He will remain on gabapentin. We will check his diabetic labs today.  Spondylolisthesis of lumbar region He has had surgery in the past for his chronic back pain.  He has had herniations of his lumbar and cervical spine.  He can use tylenol as needed.  Benign prostatic hyperplasia without lower urinary tract symptoms Continue on flomax.  We will check a PSA today.  Restless legs  syndrome We are checking his iron studies today for his history of iron deficiency anemia.  Continue on ropinrole.  Iron deficiency anemia, unspecified I will repeat his CBC and iron studies today.  Hypercholesterolemia Again, his goal LDL is <55.  He remains on a statin at this time.  Depression He has mild recurrent major depression that is currently controlled.  BMI 27.0-27.9,adult I want him to eat healthy, continue to exercise and try to lose weight.   Prevention Health maintenance discussed.  WE will obtain yearly labs.    Medicare Attestation I have personally reviewed: The patient's medical and social history Their use of alcohol, tobacco or illicit drugs Their current medications and supplements The patient's functional ability including ADLs,fall risks, home safety risks, cognitive, and hearing and visual impairment Diet and physical activities Evidence for depression or mood disorders  The patient's weight, height, and BMI have been recorded in the chart.  I have made referrals, counseling, and provided education to the patient based on review of the above and I have provided the patient with a written personalized care plan for preventive services.     Crist Fat, MD   07/09/2023

## 2023-07-09 NOTE — Assessment & Plan Note (Signed)
I will repeat his CBC and iron studies today.

## 2023-07-09 NOTE — Assessment & Plan Note (Signed)
He has CAD that is stable without angina.  We will continue with secondary prevention where he is on a statin and ASA.

## 2023-07-09 NOTE — Assessment & Plan Note (Signed)
I want him to eat healthy, continue to exercise and try to lose weight.

## 2023-07-09 NOTE — Assessment & Plan Note (Signed)
He has mild recurrent major depression that is currently controlled.

## 2023-07-10 ENCOUNTER — Encounter: Payer: Self-pay | Admitting: Internal Medicine

## 2023-07-10 ENCOUNTER — Ambulatory Visit: Payer: Medicare HMO | Admitting: Internal Medicine

## 2023-07-10 VITALS — BP 121/60 | HR 91 | Temp 98.0°F | Resp 16 | Ht 67.0 in | Wt 170.6 lb

## 2023-07-10 DIAGNOSIS — G4733 Obstructive sleep apnea (adult) (pediatric): Secondary | ICD-10-CM | POA: Diagnosis not present

## 2023-07-10 DIAGNOSIS — G2581 Restless legs syndrome: Secondary | ICD-10-CM

## 2023-07-10 DIAGNOSIS — Z7189 Other specified counseling: Secondary | ICD-10-CM

## 2023-07-10 LAB — MICROALBUMIN / CREATININE URINE RATIO
Creatinine, Urine: 34 mg/dL
Microalb/Creat Ratio: 27 mg/g{creat} (ref 0–29)
Microalbumin, Urine: 9.2 ug/mL

## 2023-07-10 LAB — CMP14 + ANION GAP
ALT: 18 [IU]/L (ref 0–44)
AST: 20 [IU]/L (ref 0–40)
Albumin: 4.5 g/dL (ref 3.9–4.9)
Alkaline Phosphatase: 77 [IU]/L (ref 44–121)
Anion Gap: 12 mmol/L (ref 10.0–18.0)
BUN/Creatinine Ratio: 17 (ref 10–24)
BUN: 17 mg/dL (ref 8–27)
Bilirubin Total: 0.7 mg/dL (ref 0.0–1.2)
CO2: 26 mmol/L (ref 20–29)
Calcium: 9 mg/dL (ref 8.6–10.2)
Chloride: 99 mmol/L (ref 96–106)
Creatinine, Ser: 1 mg/dL (ref 0.76–1.27)
Globulin, Total: 2.1 g/dL (ref 1.5–4.5)
Glucose: 138 mg/dL — ABNORMAL HIGH (ref 70–99)
Potassium: 4 mmol/L (ref 3.5–5.2)
Sodium: 137 mmol/L (ref 134–144)
Total Protein: 6.6 g/dL (ref 6.0–8.5)
eGFR: 81 mL/min/{1.73_m2} (ref >59)

## 2023-07-10 LAB — LIPID PANEL
Chol/HDL Ratio: 3 {ratio} (ref 0.0–5.0)
Cholesterol, Total: 134 mg/dL (ref 100–199)
HDL: 45 mg/dL (ref >39)
LDL Chol Calc (NIH): 70 mg/dL (ref 0–99)
Triglycerides: 99 mg/dL (ref 0–149)
VLDL Cholesterol Cal: 19 mg/dL (ref 5–40)

## 2023-07-10 LAB — HEMOGLOBIN A1C
Est. average glucose Bld gHb Est-mCnc: 146 mg/dL
Hgb A1c MFr Bld: 6.7 % — ABNORMAL HIGH (ref 4.8–5.6)

## 2023-07-10 LAB — CBC WITH DIFFERENTIAL/PLATELET
Basophils Absolute: 0 10*3/uL (ref 0.0–0.2)
Basos: 0 %
EOS (ABSOLUTE): 0.1 10*3/uL (ref 0.0–0.4)
Eos: 2 %
Hematocrit: 35.1 % — ABNORMAL LOW (ref 37.5–51.0)
Hemoglobin: 11.5 g/dL — ABNORMAL LOW (ref 13.0–17.7)
Immature Grans (Abs): 0 10*3/uL (ref 0.0–0.1)
Immature Granulocytes: 0 %
Lymphocytes Absolute: 0.9 10*3/uL (ref 0.7–3.1)
Lymphs: 17 %
MCH: 33 pg (ref 26.6–33.0)
MCHC: 32.8 g/dL (ref 31.5–35.7)
MCV: 101 fL — ABNORMAL HIGH (ref 79–97)
Monocytes Absolute: 0.3 10*3/uL (ref 0.1–0.9)
Monocytes: 7 %
Neutrophils Absolute: 3.6 10*3/uL (ref 1.4–7.0)
Neutrophils: 74 %
Platelets: 144 10*3/uL — ABNORMAL LOW (ref 150–450)
RBC: 3.49 x10E6/uL — ABNORMAL LOW (ref 4.14–5.80)
RDW: 12.9 % (ref 11.6–15.4)
WBC: 4.9 10*3/uL (ref 3.4–10.8)

## 2023-07-10 LAB — IRON: Iron: 60 ug/dL (ref 38–169)

## 2023-07-10 LAB — PSA: Prostate Specific Ag, Serum: 0.6 ng/mL (ref 0.0–4.0)

## 2023-07-10 LAB — FERRITIN: Ferritin: 171 ng/mL (ref 30–400)

## 2023-07-10 LAB — TSH: TSH: 2.65 u[IU]/mL (ref 0.450–4.500)

## 2023-07-10 NOTE — Progress Notes (Signed)
[facility] [address] [place], [place] [address]  Pulmonary Sleep Medicine   Office Visit Note  Patient Name: Kristopher [name] DOB: February 17, 1951 MRN [id number]  Date of Service: 07/10/2023  Complaints/HPI: He has been doing well with the CPAP device. States he has only missed 2 nights. No cough no nasal congestion. He states overall feels better with the device. HIs pressures are adequate  Office Spirometry Results:     ROS  General: (-) fever, (-) chills, (-) night sweats, (-) weakness Skin: (-) rashes, (-) itching,. Eyes: (-) visual changes, (-) redness, (-) itching. Nose and Sinuses: (-) nasal stuffiness or itchiness, (-) postnasal drip, (-) nosebleeds, (-) sinus trouble. Mouth and Throat: (-) sore throat, (-) hoarseness. Neck: (-) swollen glands, (-) enlarged thyroid, (-) neck pain. Respiratory: - cough, (-) bloody sputum, - shortness of breath, - wheezing. Cardiovascular: - ankle swelling, (-) chest pain. Lymphatic: (-) lymph node enlargement. Neurologic: (-) numbness, (-) tingling. Psychiatric: (-) anxiety, (-) depression   Current Medication: Outpatient Encounter Medications as of 07/10/2023  Medication Sig   aspirin EC 81 MG tablet Take 81 mg by mouth every evening.    baclofen (LIORESAL) 10 MG tablet Take 1 tablet (10 mg total) by mouth 3 (three) times daily as needed for muscle spasms.   diltiazem (CARDIZEM SR) 120 MG 12 hr capsule Take 120 mg by mouth every 36 (thirty-six) hours.   escitalopram (LEXAPRO) 20 MG tablet TAKE 1 TABLET BY MOUTH EVERY DAY   gabapentin (NEURONTIN) 600 MG tablet Take 1 tablet (600 mg total) by mouth 3 (three) times daily.   metFORMIN (GLUCOPHAGE-XR) 500 MG 24 hr tablet Take 1,000 mg by mouth 2 (two) times daily.   Misc Natural Products (PUMPKIN SEED OIL) CAPS Take 1 capsule by mouth daily. 3000 mg   niacin 500 MG tablet Take 500 mg by mouth at bedtime.   pantoprazole (PROTONIX) 40 MG tablet Take 1 tablet (40 mg total) by mouth  daily.   PRECISION QID TEST test strip 1 each by Other route as needed.   promethazine (PHENERGAN) 25 MG tablet Take 1 tablet (25 mg total) by mouth every 6 (six) hours as needed.   rOPINIRole (REQUIP) 1 MG tablet TAKE 1 TABLET BY MOUTH 1 TO 3 HOURS BEFORE BEDTIME   rosuvastatin (CRESTOR) 10 MG tablet TAKE 1 TABLET BY MOUTH ON SAT AND SUNDAY   rosuvastatin (CRESTOR) 5 MG tablet Take 5 mg by mouth every evening.   Saw Palmetto 450 MG CAPS Take 450 mg by mouth at bedtime.   tamsulosin (FLOMAX) 0.4 MG CAPS capsule Take 0.4 mg by mouth at bedtime.   No facility-administered encounter medications on file as of 07/10/2023.    Surgical History: Past Surgical History:  Procedure Laterality Date   APPENDECTOMY  ~ 1965   ATRIAL FIBRILLATION ABLATION  06/19/1998   Dr Macon Large at [facility]   BIOPSY  06/15/2022   Procedure: BIOPSY;  Surgeon: Jenel Lucks, MD;  Location: [facility];  Service: Gastroenterology;;   CARDIAC CATHETERIZATION     COLONOSCOPY WITH PROPOFOL N/A 06/15/2022   Procedure: COLONOSCOPY WITH PROPOFOL;  Surgeon: Jenel Lucks, MD;  Location: [facility];  Service: Gastroenterology;  Laterality: N/A;   CORONARY ANGIOPLASTY     CYSTOSCOPY WITH RETROGRADE URETHROGRAM N/A 07/21/2021   Procedure: CYSTOSCOPY WITH URETHRAL DILATION;  Surgeon: Crist Fat, MD;  Location: [facility];  Service: Urology;  Laterality: N/A;  45 MINS   ESOPHAGOGASTRODUODENOSCOPY (EGD) WITH PROPOFOL N/A 06/15/2022  Procedure: ESOPHAGOGASTRODUODENOSCOPY (EGD) WITH PROPOFOL;  Surgeon: Jenel Lucks, MD;  Location: [facility];  Service: Gastroenterology;  Laterality: N/A;   LEFT AND RIGHT HEART CATHETERIZATION WITH CORONARY ANGIOGRAM N/A 10/07/2013   Procedure: LEFT AND RIGHT HEART CATHETERIZATION WITH CORONARY ANGIOGRAM;  Surgeon: Lennette Bihari, MD;  Location: [facility] CATH LAB;  Service: Cardiovascular;  Laterality: N/A;   LEFT HEART CATHETERIZATION WITH CORONARY ANGIOGRAM N/A 06/27/2013    Procedure: LEFT HEART CATHETERIZATION WITH CORONARY ANGIOGRAM;  Surgeon: Lennette Bihari, MD;  Location: [facility] CATH LAB;  Service: Cardiovascular;  Laterality: N/A;   LEFT HEART CATHETERIZATION WITH CORONARY ANGIOGRAM N/A 07/10/2013   Procedure: LEFT HEART CATHETERIZATION WITH CORONARY ANGIOGRAM;  Surgeon: Kathleene Hazel, MD;  Location: [facility] CATH LAB;  Service: Cardiovascular;  Laterality: N/A;   LEFT HEART CATHETERIZATION WITH CORONARY ANGIOGRAM N/A 08/28/2013   Procedure: LEFT HEART CATHETERIZATION WITH CORONARY ANGIOGRAM;  Surgeon: Lennette Bihari, MD;  Location: [facility] CATH LAB;  Service: Cardiovascular;  Laterality: N/A;   NEPHROLITHOTOMY Left 02/11/2021   Procedure: LEFT NEPHROLITHOTOMY PERCUTANEOUS WITH SURGEON ACCESS;  Surgeon: Crist Fat, MD;  Location: [facility];  Service: Urology;  Laterality: Left;   PERCUTANEOUS CORONARY STENT INTERVENTION (PCI-S)  06/27/2013   Procedure: PERCUTANEOUS CORONARY STENT INTERVENTION (PCI-S);  Surgeon: Lennette Bihari, MD;  Location: [facility] CATH LAB;  Service: Cardiovascular;;   POLYPECTOMY  06/15/2022   Procedure: POLYPECTOMY;  Surgeon: Jenel Lucks, MD;  Location: [facility];  Service: Gastroenterology;;   POSTERIOR CERVICAL FUSION/FORAMINOTOMY  06/20/2007   fusion c3-t1   PULMONARY VEIN ABLATION  06/20/1999   Dr Macon [facility] at [facility]   TONSILLECTOMY  06/19/1957   TRANSFORAMINAL LUMBAR INTERBODY FUSION W/ MIS 1 LEVEL Left 12/08/2021   Procedure: LEFT LUMBAR FIVE-SACRAL ONE MINIMALLY INVASIVE (MIS) TRANSFORAMINAL LUMBAR INTERBODY FUSION;  Surgeon: Jadene Pierini, MD;  Location: [facility] OR;  Service: Neurosurgery;  Laterality: Left;    Medical History: Past Medical History:  Diagnosis Date   Anemia 12/2020   Aortic atherosclerosis (HCC)    Atrial fibrillation (HCC)    a. s/p PVI X2 by Dr Macon Large at [facility] around 2000.   CAD (coronary artery disease)    a. NSTEMI (06/2013):  LHC (06/27/2013):  pLAD 10-20, RCA 90, EF 45-50%, inf HK.  PCI:  Xience Alpine  (3 x 23 mm) DES to the RCA. b. Relook cath 07/10/13 - patent stent. Imdur added. c. Recurrent CP after that - abnormal nuc 07/2017 with chest pain, dyspnea, hypotensive response. Rest images with mid-basal inferior thinning. No stress images due to abrupt d/c of test. Relook cath 3/12 - no significant r   Chronic back pain greater than 3 months duration    Cirrhosis, non-alcoholic (HCC)    Depression    DJD (degenerative joint disease) of cervical spine    "3 herniated discs" (09/01/2013)   DJD (degenerative joint disease), lumbosacral    "bulging L5-S1" (09/01/2013)   Dysrhythmia    PVCs. A.fib with two ablations   Herniated cervical disc    c2-T1 fusion   History of kidney stones    turned into urosepsis   HLD (hyperlipidemia)    Ischemic cardiomyopathy    a. Echo (06/28/2011): Inferior and inferoseptal akinesis, EF 40%, grade 2 diastolic dysfunction, MAC, small effusion (no tamponade). B. EF 40% by echo 06/2013, cath 07/2013, 55% by cath 08/2013 with subtle mild mid inferior hypocontractility.   Liver cirrhosis secondary to NASH [facility])    Lumbar herniated disc    Neuromuscular disorder (HCC)  bilateral neuropathy legs   NSTEMI (non-ST elevated myocardial infarction) (HCC) 06/27/2013   Osteoarthritis    "neck and lower back" (09/01/2013)   Pericardial effusion    a. Chronic since hx of pericarditis in 2004. b. Mod by echo 06/2013, small by echo 06/2013.   Restless leg syndrome    Type II diabetes mellitus (HCC)     Family History: Family History  Problem Relation Age of Onset   Ovarian cancer Mother    Arthritis Mother    CAD Father    Liver disease Father    Diabetes Father    Arthritis Father    COPD Father    Arthritis Sister    Dementia Sister    Stroke Maternal Grandmother    Sudden death Maternal Grandfather    Heart attack Maternal Grandfather    ALS Paternal Grandmother    Cancer Paternal Grandfather        unknown   Arrhythmia Neg Hx     Social History: Social  History   Socioeconomic History   Marital status: Married    Spouse name: Not on file   Number of children: 0   Years of education: Not on file   Highest education level: Not on file  Occupational History   Occupation: RESP/retired    Employer: [employer]    Comment: Theatre manager at [employer]  Tobacco Use   Smoking status: Former    Current packs/day: 0.00    Average packs/day: 1.5 packs/day for 43.0 years (64.5 ttl pk-yrs)    Types: Cigarettes    Start date: 05/05/1964    Quit date: 05/06/2007    Years since quitting: 16.1   Smokeless tobacco: Never  Vaping Use   Vaping status: Never Used  Substance and Sexual Activity   Alcohol use: Yes    Comment: once monthly/rarely   Drug use: No   Sexual activity: Not Currently  Other Topics Concern   Not on file  Social History Narrative   Not on file   Social Drivers of Health   Financial Resource Strain: Low Risk  (08/22/2020)   Received from [facility]   Overall Financial Resource Strain (CARDIA)  Food Insecurity: No Food Insecurity (08/22/2020)   Received from [facility]   Hunger Vital Sign  Transportation Needs: No Transportation Needs (08/22/2020)   Received from [facility]   PRAPARE - Transportation  Physical Activity: Sufficiently Active (08/22/2020)   Received from [facility]   Exercise Vital Sign  Stress: Stress Concern Present (08/22/2020)   Received from [facility]   [employer] of Occupational Health - Occupational Stress Questionnaire  Social Connections: Not on file  Intimate Partner Violence: Not on file    Vital Signs: Blood pressure 121/60, pulse 91, temperature 98 F (36.7 C), resp. rate 16, height 5\' 7"  (1.702 m), weight 170 lb 9.6 oz (77.4 kg), SpO2 98%.  Examination: General Appearance: The patient is well-developed, well-nourished, and in no distress. Skin: Gross inspection  of skin unremarkable. Head: normocephalic, no gross deformities. Eyes: no gross deformities noted. ENT: ears appear grossly normal no exudates. Neck: Supple. No thyromegaly. No LAD. Respiratory: no rhonch noted. Cardiovascular: Normal S1 and S2 without murmur or rub. Extremities: No cyanosis. pulses are equal. Neurologic: Alert and oriented. No involuntary movements.  LABS: Recent Results (from the past 2160 hours)  CBC with Differential/Platelet     Status: Abnormal   Collection Time: 07/09/23 10:56  AM  Result Value Ref Range   WBC 4.9 3.4 - 10.8 x10E3/uL   RBC 3.49 (L) 4.14 - 5.80 x10E6/uL   Hemoglobin 11.5 (L) 13.0 - 17.7 g/dL   Hematocrit 16.1 (L) 09.6 - 51.0 %   MCV 101 (H) 79 - 97 fL   MCH 33.0 26.6 - 33.0 pg   MCHC 32.8 31.5 - 35.7 g/dL   RDW 04.5 40.9 - 81.1 %   Platelets 144 (L) 150 - 450 x10E3/uL   Neutrophils 74 Not Estab. %   Lymphs 17 Not Estab. %   Monocytes 7 Not Estab. %   Eos 2 Not Estab. %   Basos 0 Not Estab. %   Neutrophils Absolute 3.6 1.4 - 7.0 x10E3/uL   Lymphocytes Absolute 0.9 0.7 - 3.1 x10E3/uL   Monocytes Absolute 0.3 0.1 - 0.9 x10E3/uL   EOS (ABSOLUTE) 0.1 0.0 - 0.4 x10E3/uL   Basophils Absolute 0.0 0.0 - 0.2 x10E3/uL   Immature Granulocytes 0 Not Estab. %   Immature Grans (Abs) 0.0 0.0 - 0.1 x10E3/uL  CMP14 + Anion Gap     Status: Abnormal   Collection Time: 07/09/23 10:56 AM  Result Value Ref Range   Glucose 138 (H) 70 - 99 mg/dL   BUN 17 8 - 27 mg/dL   Creatinine, Ser 9.14 0.76 - 1.27 mg/dL   eGFR 81 >78 GN/FAO/1.30   BUN/Creatinine Ratio 17 10 - 24   Sodium 137 134 - 144 mmol/L   Potassium 4.0 3.5 - 5.2 mmol/L   Chloride 99 96 - 106 mmol/L   CO2 26 20 - 29 mmol/L   Anion Gap 12.0 10.0 - 18.0 mmol/L   Calcium 9.0 8.6 - 10.2 mg/dL   Total Protein 6.6 6.0 - 8.5 g/dL   Albumin 4.5 3.9 - 4.9 g/dL   Globulin, Total 2.1 1.5 - 4.5 g/dL   Bilirubin Total 0.7 0.0 - 1.2 mg/dL   Alkaline Phosphatase 77 44 - 121 IU/L   AST 20 0 - 40 IU/L    ALT 18 0 - 44 IU/L  Hemoglobin A1c     Status: Abnormal   Collection Time: 07/09/23 10:56 AM  Result Value Ref Range   Hgb A1c MFr Bld 6.7 (H) 4.8 - 5.6 %    Comment:          Prediabetes: 5.7 - 6.4          Diabetes: >6.4          Glycemic control for adults with diabetes: <7.0    Est. average glucose Bld gHb Est-mCnc 146 mg/dL  Lipid panel     Status: None   Collection Time: 07/09/23 10:56 AM  Result Value Ref Range   Cholesterol, Total 134 100 - 199 mg/dL   Triglycerides 99 0 - 149 mg/dL   HDL 45 >86 mg/dL   VLDL Cholesterol Cal 19 5 - 40 mg/dL   LDL Chol Calc (NIH) 70 0 - 99 mg/dL   Chol/HDL Ratio 3.0 0.0 - 5.0 ratio    Comment:                                   T. Chol/HDL Ratio  Men  Women                               1/2 Avg.Risk  3.4    3.3                                   Avg.Risk  5.0    4.4                                2X Avg.Risk  9.6    7.1                                3X Avg.Risk 23.4   11.0   Microalbumin / creatinine urine ratio     Status: None   Collection Time: 07/09/23 10:56 AM  Result Value Ref Range   Creatinine, Urine 34.0 Not Estab. mg/dL   Microalbumin, Urine 9.2 Not Estab. ug/mL   Microalb/Creat Ratio 27 0 - 29 mg/g creat    Comment:                        Normal:                0 -  29                        Moderately increased: 30 - 300                        Severely increased:       >300   TSH     Status: None   Collection Time: 07/09/23 10:56 AM  Result Value Ref Range   TSH 2.650 0.450 - 4.500 uIU/mL  PSA     Status: None   Collection Time: 07/09/23 10:56 AM  Result Value Ref Range   Prostate Specific Ag, Serum 0.6 0.0 - 4.0 ng/mL    Comment: Roche ECLIA methodology. According to the American Urological Association, Serum PSA should decrease and remain at undetectable levels after radical prostatectomy. The AUA defines biochemical recurrence as an initial PSA value 0.2 ng/mL or greater  followed by a subsequent confirmatory PSA value 0.2 ng/mL or greater. Values obtained with different assay methods or kits cannot be used interchangeably. Results cannot be interpreted as absolute evidence of the presence or absence of malignant disease.   Iron     Status: None   Collection Time: 07/09/23 10:56 AM  Result Value Ref Range   Iron 60 38 - 169 ug/dL  Ferritin     Status: None   Collection Time: 07/09/23 10:56 AM  Result Value Ref Range   Ferritin 171 30 - 400 ng/mL    Radiology: MR LUMBAR SPINE WO CONTRAST Result Date: 04/09/2023 CLINICAL DATA:  Spondylolisthesis of lumbar region. EXAM: MRI LUMBAR SPINE WITHOUT CONTRAST TECHNIQUE: Multiplanar, multisequence MR imaging of the lumbar spine was performed. No intravenous contrast was administered. COMPARISON:  CT 12/12/2021.  MRI 07/16/2021. FINDINGS: Segmentation: 5 lumbar type vertebral bodies as numbered previously on the CT. Prior MRI described S1 as a transitional segment. This exam describes L5 as a transitional segment. Alignment:  Normal Vertebrae:  Previous PLIF L4-5. Conus medullaris and cauda  equina: Conus extends to the L1 level. Conus and cauda equina appear normal. Paraspinal and other soft tissues: Negative Disc levels: No significant finding from T11-12 through L2-3. L3-4: Mild bulging of the disc. Bilateral facet and ligamentous hypertrophy. Mild narrowing of the lateral recesses and foramina but no visible neural compression. L4-5: Previous posterior decompression, diskectomy and fusion. Pedicle screws and posterior rods. No complicating feature visible by MRI. Apparent sufficient patency of the canal and foramina. L5-S1: Transitional features. No significant disc or facet abnormality. IMPRESSION: 1. Previous posterior decompression, diskectomy and fusion at L4-5. Sufficient patency of the canal and foramina. 2. L3-4: Mild bulging of the disc. Facet and ligamentous hypertrophy. Mild narrowing of the lateral recesses and  foramina but no visible neural compression. Electronically Signed   By: Paulina Fusi M.D.   On: 04/09/2023 14:52    No results found.  No results found.  Assessment and Plan: Patient Active Problem List   Diagnosis Date Noted   Benign prostatic hyperplasia without lower urinary tract symptoms 07/09/2023   BMI 27.0-27.9,adult 07/09/2023   Type 2 diabetes mellitus with other circulatory complications (HCC) 01/03/2023   OSA (obstructive sleep apnea) 11/22/2022   Wheezing 07/17/2022   Atrial fibrillation (HCC) 07/17/2022   Diabetic polyneuropathy associated with type 2 diabetes mellitus (HCC) 06/30/2022   Aortic atherosclerosis (HCC) 06/30/2022   Herniated disc, cervical 06/30/2022   Lumbar herniated disc 06/30/2022   Vitamin B12 deficiency 06/30/2022   BMI 25.0-25.9,adult 06/30/2022   Iron (Fe) deficiency anemia 03/13/2022   Iron deficiency anemia, unspecified 02/23/2022   Nausea and vomiting 12/13/2021   Constipation 12/13/2021   Spondylolisthesis of lumbar region 12/08/2021   Hypokalemia    Hypomagnesemia    Hypercholesterolemia    Acute cephalic vein thrombosis, right    Dyspnea    Atrial fibrillation with RVR (HCC)    Anemia 02/23/2021   Nephrolithiasis 02/11/2021   Syncope 09/18/2019   Encounter for general adult medical examination with abnormal findings 04/30/2018   Muscle spasm 04/30/2018   Dysuria 04/30/2018   Cirrhosis (HCC) 12/17/2015   Pericardial effusion 07/22/2014   Restless legs syndrome 06/26/2014   Other amnesia 06/04/2014   Coronary artery disease involving native coronary artery of native heart without angina pectoris 04/13/2014   Cardiomyopathy, ischemic 04/13/2014   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   Obstructive sleep apnea syndrome 03/12/2014   Acute pericarditis, unspecified - possible 09/04/2013   Exertional chest pain 09/04/2013   Chest pain with moderate risk for cardiac etiology - etiology likely pericarditis  09/01/2013   Abnormal exercise tolerance test 08/26/2013   Depression 08/07/2013   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   Atrial arrhythmia    Non-ST elevation myocardial infarction (NSTEMI), subendocardial infarction, subsequent episode of care (HCC) 06/27/2013    Class: History of    1. OSA (obstructive sleep apnea) (Primary) On CPAP will be continued. He states he has had good response to treatment  2. RLS (restless legs syndrome) On Gaba and Requip some improvement is noted  3. CPAP Counseling: had a lengthy discussion with the patient regarding the importance of PAP therapy in management of the sleep apnea.  General Counseling: I have discussed the findings of the evaluation and examination with Fayrene Fearing.  I have also discussed any further diagnostic evaluation thatmay be needed or ordered today. Jayesh verbalizes understanding of the findings of todays visit. We also reviewed his medications today and discussed drug interactions and side effects including but not limited excessive drowsiness and altered mental states. We  also discussed that there is always a risk not just to him but also people around him. he has been encouraged to call the office with any questions or concerns that should arise related to todays visit.  No orders of the defined types were placed in this encounter.    Time spent: 91  I have personally obtained a history, examined the patient, evaluated laboratory and imaging results, formulated the assessment and plan and placed orders.    Yevonne Pax, MD [facility] Pulmonary and Critical Care Sleep medicine

## 2023-07-10 NOTE — Patient Instructions (Signed)

## 2023-07-11 ENCOUNTER — Ambulatory Visit: Payer: Medicare HMO | Admitting: Gastroenterology

## 2023-07-11 ENCOUNTER — Other Ambulatory Visit (INDEPENDENT_AMBULATORY_CARE_PROVIDER_SITE_OTHER): Payer: Medicare HMO

## 2023-07-11 ENCOUNTER — Encounter: Payer: Self-pay | Admitting: Gastroenterology

## 2023-07-11 VITALS — BP 120/68 | HR 80 | Ht 67.0 in | Wt 173.6 lb

## 2023-07-11 DIAGNOSIS — K7581 Nonalcoholic steatohepatitis (NASH): Secondary | ICD-10-CM

## 2023-07-11 DIAGNOSIS — R197 Diarrhea, unspecified: Secondary | ICD-10-CM | POA: Diagnosis not present

## 2023-07-11 DIAGNOSIS — R112 Nausea with vomiting, unspecified: Secondary | ICD-10-CM | POA: Diagnosis not present

## 2023-07-11 DIAGNOSIS — D500 Iron deficiency anemia secondary to blood loss (chronic): Secondary | ICD-10-CM

## 2023-07-11 LAB — PROTIME-INR
INR: 1.1 {ratio} — ABNORMAL HIGH (ref 0.8–1.0)
Prothrombin Time: 11.4 s (ref 9.6–13.1)

## 2023-07-11 NOTE — Progress Notes (Signed)
Discussed the use of AI scribe software for clinical note transcription with the patient, who gave verbal consent to proceed.  HPI : Kristopher [name] is a 73 y.o. male with a history of CAD s/p stent, a-fib and compensated NASH cirrhosis who is referred to [place] by Crist Fat, MD. I initially saw him in October 2023 for further evaluation of iron deficiency anemia.  He underwent an EGD and a colonoscopy in December 2023 which were unrevealing for source of iron deficiency anemia.  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  It was thought that his iron deficiency was likely secondary from bleeding from AVMs.  He had also been having episodes of nausea and vomiting of unclear etiology.  This has been going on for many years, but seems to be increasing in frequency in the past several months or so.  The patient describes a sudden onset of nausea, with an urgency to vomit within about thirty seconds of the onset of nausea. The episodes are not clearly related to food intake and can occur several hours after a meal, sometimes in the middle of the night. The vomitus typically contains food products, but can also appear as clear, mucus-like bile.  He describes his most recent episode where he ate dinner at 9 pm, then awoke suddenly at 2 am with nausea and urge to vomit.  The emesis included a large amount of food that he had eaten for dinner.   These episodes can vary in the timing around meals, sometimes occurring many hours after meals, sometimes shortly after.  The patient's appetite remains good, and he is able to consume full meals without early satiety or post-prandial discomfort. The patient's weight has remained stable, and he denies any abdominal pain associated with the nausea and vomiting.  In addition to the nausea and vomiting, the patient has been experiencing diarrhea for the past month,  necessitating the use of loperamide about every other day. He typically will take 2 tablets, and then sometimes a third.  The diarrhea is characterized by urgency and increased frequency, with the patient reporting four to five bowel movements per day one day, and then having normal formed stools the next.  No significant abdominal pain associated with the diarrhea.  No blood in the stool.   He denies any recent changes in diet or medication.  No recent antibiotics.   The patient's medical history is significant for diabetes, which is well-controlled with a recent HbA1c of 5.9. The patient has been on metformin for a long duration and has not started any new medications recently.   The patient also has a history of non-alcoholic steatohepatitis (NASH) which had been followed by [facility].  The patient would like to transfer his liver care to [place] if possible.  He denies any concerning symptoms such as jaundice/icterus, abdominal/leg swelling, head fog/confusion.  He also has a history of coronary artery disease and atrial fibrillation but denies any cardiac symptoms associated with the nausea and vomiting episodes such as chest pain/pressure, palpitations, lightheadedness/syncope, shortness of breath.   The patient is also on pantoprazole, but denies any symptoms of heartburn or reflux.   The patient's iron levels were recently checked and found to be within normal limits.      EGD June 15, 2022 - The examined portions of the nasopharynx, oropharynx and larynx were normal.  -  Normal esophagus.  - Normal stomach. Biopsied.  - Normal examined duodenum. Biopsied.  - No endoscopic abnormalities to explain patient's iron deficiency anemia.  Colonoscopy June 15, 2022 - Perianal skin tags found on perianal exam.  - One 4 mm polyp in the ascending colon, removed with a cold snare. Resected and retrieved.  - Three 2 to 4 mm polyps in the rectum, removed with a cold snare. Resected and retrieved.   - Moderate diverticulosis in the descending colon, in the transverse colon and in the ascending colon. There was no evidence of diverticular bleeding.  - The examined portion of the ileum was normal.  - Non-bleeding internal hemorrhoids.  - No endoscopic findings to explain patient's iron deficiency anemia.  Video capsule endoscopy July 28, 2022 Patient's video capsule endoscopy showed a very small AVM at 7 minutes 33 seconds which is 2 minutes beyond the first duodenal image, as well as a few scattered areas of tiny red spots.  No active bleeding was noted.  No mass lesion. 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  His most recent iron panel is unremarkable, and his hemoglobin is up to 11.4 from 9.4 in June. Recommend continued oral iron supplementation and repeat CBC and iron panel in May.  If his iron deficiency is worsening, will likely proceed with push enteroscopy and ablation of any AVMs       ---------------------------------------------------------------------------------------------- From [facility]  EGD Nov 2020 Findings: The esophagus was normal. Patchy mildly erythematous mucosa without bleeding was  found in the gastric antrum. Biopsies were taken with a  cold forceps for Helicobacter pylori testing. The examined duodenum was normal.   Biopsies with chronic gastritis, no H. Pylor  Patient has had a extensive serologic work-up for elevated liver enzyme tests in the past including ASMA, IgG, A1AT and HFE testing after an elevated transferrin saturation (74%). Fibroscan showed stiffness 16.6 kPa from (07/2015).  Fibroscan showed stiffness 16.6 kPa from (07/2015). His last HCC surveillance was 09/2017 and was negative. His last EV surveillance was 01/2016 and was notable for PHG without EV. Variceal surveillance: Negative EGD (01/2016) CRC screening: Last performed in 2014 in [place], [place] [facility] surveillance: Negative  ultrasound 10/16/17 Hepatitis A serology: Not immune Hepatitis B serology: Not immune Influenza: Recommended this occur late into each fall Pneumonia: Prevnar followed by pneumovax after 8 week  Past Medical History:  Diagnosis Date   Anemia 12/2020   Aortic atherosclerosis (HCC)    Atrial fibrillation (HCC)    a. s/p PVI X2 by Dr Macon [facility] at [facility] around 2000.   CAD (coronary artery disease)    a. NSTEMI (06/2013):  LHC (06/27/2013):  pLAD 10-20, RCA 90, EF 45-50%, inf HK.  PCI:  Xience Alpine (3 x 23 mm) DES to the RCA. b. Relook cath 07/10/13 - patent stent. Imdur added. c. Recurrent CP after that - abnormal nuc 07/2017 with chest pain, dyspnea, hypotensive response. Rest images with mid-basal inferior thinning. No stress images due to abrupt d/c of test. Relook cath 3/12 - no significant r   Chronic back pain greater than 3 months duration    Cirrhosis, non-alcoholic (HCC)    Depression    DJD (degenerative joint disease) of cervical spine    "3 herniated discs" (09/01/2013)   DJD (degenerative joint disease), lumbosacral    "bulging L5-S1" (09/01/2013)   Dysrhythmia    PVCs. A.fib with two ablations   Herniated cervical disc    c2-T1 fusion  History of kidney stones    turned into urosepsis   HLD (hyperlipidemia)    Ischemic cardiomyopathy    a. Echo (06/28/2011): Inferior and inferoseptal akinesis, EF 40%, grade 2 diastolic dysfunction, MAC, small effusion (no tamponade). B. EF 40% by echo 06/2013, cath 07/2013, 55% by cath 08/2013 with subtle mild mid inferior hypocontractility.   Liver cirrhosis secondary to NASH [facility])    Lumbar herniated disc    Neuromuscular disorder (HCC)    bilateral neuropathy legs   NSTEMI (non-ST elevated myocardial infarction) (HCC) 06/27/2013   Osteoarthritis    "neck and lower back" (09/01/2013)   Pericardial effusion    a. Chronic since hx of pericarditis in 2004. b. Mod by echo 06/2013, small by echo 06/2013.   Restless leg syndrome    Type II diabetes  mellitus (HCC)      Past Surgical History:  Procedure Laterality Date   APPENDECTOMY  ~ 1965   ATRIAL FIBRILLATION ABLATION  06/19/1998   Dr Macon Large at [facility]   BIOPSY  06/15/2022   Procedure: BIOPSY;  Surgeon: Jenel Lucks, MD;  Location: [facility];  Service: Gastroenterology;;   CARDIAC CATHETERIZATION     COLONOSCOPY WITH PROPOFOL N/A 06/15/2022   Procedure: COLONOSCOPY WITH PROPOFOL;  Surgeon: Jenel Lucks, MD;  Location: [facility];  Service: Gastroenterology;  Laterality: N/A;   CORONARY ANGIOPLASTY     CYSTOSCOPY WITH RETROGRADE URETHROGRAM N/A 07/21/2021   Procedure: CYSTOSCOPY WITH URETHRAL DILATION;  Surgeon: Crist Fat, MD;  Location: [facility];  Service: Urology;  Laterality: N/A;  45 MINS   ESOPHAGOGASTRODUODENOSCOPY (EGD) WITH PROPOFOL N/A 06/15/2022   Procedure: ESOPHAGOGASTRODUODENOSCOPY (EGD) WITH PROPOFOL;  Surgeon: Jenel Lucks, MD;  Location: [facility];  Service: Gastroenterology;  Laterality: N/A;   LEFT AND RIGHT HEART CATHETERIZATION WITH CORONARY ANGIOGRAM N/A 10/07/2013   Procedure: LEFT AND RIGHT HEART CATHETERIZATION WITH CORONARY ANGIOGRAM;  Surgeon: Lennette Bihari, MD;  [facility] C[facility] LAB;  Service: Cardiovascular;  Laterality: N/A;   LEFT HEART CATHETERIZATION WITH CORONARY ANGIOGRAM N/A 06/27/2013   Procedure: LEFT HEART CATHETERIZATION WITH CORONARY ANGIOGRAM;  Surgeon: Lennette Bihari, MD;  Location: [facility] CATH LAB;  Service: Cardiovascular;  Laterality: N/A;   LEFT HEART CATHETERIZATION WITH CORONARY ANGIOGRAM N/A 07/10/2013   Procedure: LEFT HEART CATHETERIZATION WITH CORONARY ANGIOGRAM;  Surgeon: Kathleene Hazel, MD;  Location: [facility] CATH LAB;  Service: Cardiovascular;  Laterality: N/A;   LEFT HEART CATHETERIZATION WITH CORONARY ANGIOGRAM N/A 08/28/2013   Procedure: LEFT HEART CATHETERIZATION WITH CORONARY ANGIOGRAM;  Surgeon: Lennette Bihari, MD;  Location: [facility] CATH LAB;  Service: Cardiovascular;  Laterality: N/A;    NEPHROLITHOTOMY Left 02/11/2021   Procedure: LEFT NEPHROLITHOTOMY PERCUTANEOUS WITH SURGEON ACCESS;  Surgeon: Crist Fat, MD;  Location: [facility];  Service: Urology;  Laterality: Left;   PERCUTANEOUS CORONARY STENT INTERVENTION (PCI-S)  06/27/2013   Procedure: PERCUTANEOUS CORONARY STENT INTERVENTION (PCI-S);  Surgeon: Lennette Bihari, MD;  Location: [facility] CATH LAB;  Service: Cardiovascular;;   POLYPECTOMY  06/15/2022   Procedure: POLYPECTOMY;  Surgeon: Jenel Lucks, MD;  Location: [facility];  Service: Gastroenterology;;   POSTERIOR CERVICAL FUSION/FORAMINOTOMY  06/20/2007   fusion c3-t1   PULMONARY VEIN ABLATION  06/20/1999   Dr Macon [facility] at [facility]   TONSILLECTOMY  06/19/1957   TRANSFORAMINAL LUMBAR INTERBODY FUSION W/ MIS 1 LEVEL Left 12/08/2021   Procedure: LEFT LUMBAR FIVE-SACRAL ONE MINIMALLY INVASIVE (MIS) TRANSFORAMINAL LUMBAR INTERBODY FUSION;  Surgeon: Jadene Pierini, MD;  Location: [facility] OR;  Service:  Neurosurgery;  Laterality: Left;   Family History  Problem Relation Age of Onset   Ovarian cancer Mother    Arthritis Mother    CAD Father    Liver disease Father    Diabetes Father    Arthritis Father    COPD Father    Arthritis Sister    Dementia Sister    Stroke Maternal Grandmother    Sudden death Maternal Grandfather    Heart attack Maternal Grandfather    ALS Paternal Grandmother    Cancer Paternal Grandfather        unknown   Arrhythmia Neg Hx    Social History   Tobacco Use   Smoking status: Former    Current packs/day: 0.00    Average packs/day: 1.5 packs/day for 43.0 years (64.5 ttl pk-yrs)    Types: Cigarettes    Start date: 05/05/1964    Quit date: 05/06/2007    Years since quitting: 16.1   Smokeless tobacco: Never  Vaping Use   Vaping status: Never Used  Substance Use Topics   Alcohol use: Yes    Comment: once monthly/rarely   Drug use: No   Current Outpatient Medications  Medication Sig Dispense Refill   aspirin EC 81 MG tablet Take  81 mg by mouth every evening.      baclofen (LIORESAL) 10 MG tablet Take 1 tablet (10 mg total) by mouth 3 (three) times daily as needed for muscle spasms. 90 each 1   diltiazem (CARDIZEM SR) 120 MG 12 hr capsule Take 120 mg by mouth every 36 (thirty-six) hours.     escitalopram (LEXAPRO) 20 MG tablet TAKE 1 TABLET BY MOUTH EVERY DAY 90 tablet 1   gabapentin (NEURONTIN) 600 MG tablet Take 1 tablet (600 mg total) by mouth 3 (three) times daily. 180 tablet 5   metFORMIN (GLUCOPHAGE-XR) 500 MG 24 hr tablet Take 1,000 mg by mouth 2 (two) times daily.     Misc Natural Products (PUMPKIN SEED OIL) CAPS Take 1 capsule by mouth daily. 3000 mg     niacin 500 MG tablet Take 500 mg by mouth at bedtime.     pantoprazole (PROTONIX) 40 MG tablet Take 1 tablet (40 mg total) by mouth daily. 90 tablet 1   PRECISION QID TEST test strip 1 each by Other route as needed.     promethazine (PHENERGAN) 25 MG tablet Take 1 tablet (25 mg total) by mouth every 6 (six) hours as needed. 30 tablet 5   rOPINIRole (REQUIP) 1 MG tablet TAKE 1 TABLET BY MOUTH 1 TO 3 HOURS BEFORE BEDTIME 90 tablet 1   rosuvastatin (CRESTOR) 10 MG tablet TAKE 1 TABLET BY MOUTH ON SAT AND SUNDAY 90 tablet 1   rosuvastatin (CRESTOR) 5 MG tablet Take 5 mg by mouth every evening.     Saw Palmetto 450 MG CAPS Take 450 mg by mouth at bedtime.     tamsulosin (FLOMAX) 0.4 MG CAPS capsule Take 0.4 mg by mouth at bedtime.     No current facility-administered medications for this visit.   Allergies  Allergen Reactions   Codeine Nausea And Vomiting   Duragesic-100 [Fentanyl] Nausea And Vomiting   Hydrocodone-Acetaminophen Nausea And Vomiting   Naprosyn [Naproxen] Nausea Only   Other Nausea And Vomiting    1. N/V to all opiates. He is able to take versed. (noted 09/29/13) 2.The patient states that he has had  a reaction to all narcotics. He can take them though. (noted 06/26/13)     Review  of Systems: All systems reviewed and negative except where noted  in HPI.    No results found.  Physical Exam: BP 120/68   Pulse 80   Ht 5\' 7"  (1.702 m)   Wt 173 lb 9.6 oz (78.7 kg)   BMI 27.19 kg/m  Constitutional: Pleasant,well-developed, Caucasian male in no acute distress. HEENT: Normocephalic and atraumatic. Conjunctivae are normal. No scleral icterus. Neck supple.  Cardiovascular: Normal rate, regular rhythm.  Pulmonary/chest: Effort normal and breath sounds normal. No wheezing, rales or rhonchi. Abdominal: Soft, nondistended, nontender. Bowel sounds active throughout. There are no masses palpable. No hepatomegaly. Extremities: no edema Lymphadenopathy: No cervical adenopathy noted. Neurological: Alert and oriented to person place and time. Skin: Skin is warm and dry. No rashes noted. Psychiatric: Normal mood and affect. Behavior is normal.  CBC    Component Value Date/Time   WBC 4.9 07/09/2023 1056   WBC 4.2 12/14/2021 0339   RBC 3.49 (L) 07/09/2023 1056   RBC 3.04 (L) 12/14/2021 0339   HGB 11.5 (L) 07/09/2023 1056   HCT 35.1 (L) 07/09/2023 1056   PLT 144 (L) 07/09/2023 1056   MCV 101 (H) 07/09/2023 1056   MCH 33.0 07/09/2023 1056   MCH 30.9 12/14/2021 0339   MCHC 32.8 07/09/2023 1056   MCHC 33.2 12/14/2021 0339   RDW 12.9 07/09/2023 1056   LYMPHSABS 0.9 07/09/2023 1056   MONOABS 0.2 12/12/2021 2034   EOSABS 0.1 07/09/2023 1056   BASOSABS 0.0 07/09/2023 1056    CMP     Component Value Date/Time   NA 137 07/09/2023 1056   K 4.0 07/09/2023 1056   CL 99 07/09/2023 1056   CO2 26 07/09/2023 1056   GLUCOSE 138 (H) 07/09/2023 1056   GLUCOSE 125 (H) 12/14/2021 0339   BUN 17 07/09/2023 1056   CREATININE 1.00 07/09/2023 1056   CREATININE 0.90 10/02/2013 0851   CALCIUM 9.0 07/09/2023 1056   PROT 6.6 07/09/2023 1056   ALBUMIN 4.5 07/09/2023 1056   AST 20 07/09/2023 1056   ALT 18 07/09/2023 1056   ALKPHOS 77 07/09/2023 1056   BILITOT 0.7 07/09/2023 1056   GFRNONAA >60 12/14/2021 0339   GFRAA >60 09/19/2019 0302        Latest Ref Rng & Units 07/09/2023   10:56 AM 01/03/2023   10:39 AM 06/30/2022   11:51 AM  CBC EXTENDED  WBC 3.4 - 10.8 x10E3/uL 4.9  4.9  4.8   RBC 4.14 - 5.80 x10E6/uL 3.49  3.73  3.41   Hemoglobin 13.0 - 17.7 g/dL 16.1  09.6  04.5   HCT 37.5 - 51.0 % 35.1  36.6  33.7   Platelets 150 - 450 x10E3/uL 144  149  140   NEUT# 1.4 - 7.0 x10E3/uL 3.6  3.5  3.6   Lymph# 0.7 - 3.1 x10E3/uL 0.9  1.0  0.9       ASSESSMENT AND PLAN:  73 year old male with history of coronary artery disease, atrial fibrillation, diabetes, compensated MASH, iron deficiency suspected secondary to intestinal AVMs with several years of recurrent episodes of nausea and vomiting of unclear etiology.  Nausea and Vomiting Chronic nausea and vomiting for years, progressively worsening, now occurring every other day. Symptoms include urgent vomiting episodes, sometimes with food or bile, occurring hours after meals. No associated abdominal pain or appetite changes.  No symptoms of early satiety or postprandial abdominal discomfort.  EGD a little over a year ago unrevealing for any cause of his symptoms.  Differential diagnosis includes gastroparesis, especially  given diabetes history. Discussed gastric emptying study involving a radiolabeled meal to track stomach emptying. If gastroparesis is confirmed, first-line treatment includes dietary modifications (small, low-fat, low-fiber meals). Medications to promote gastric emptying may be considered if symptoms persist. - Order gastric emptying study - Advise dietary modifications if gastroparesis is confirmed (small, low-fat, low-fiber meals) - Consider medications to promote gastric emptying if symptoms persist -Advised patient to track what meals he had prior to his nausea vomiting episodes, to see if there may be a pattern  Diarrhea Intermittent diarrhea for the past month, occurring every other day, with urgency and multiple episodes per day. Managed with intermittent use of  Imodium. No recent antibiotic use or dietary changes. Differential diagnosis includes pancreatic insufficiency, which can be related to diabetes and cause diarrhea and urgency. - Order fecal elastase  Metabolic dysfunction associated steatohepatitis (MASH), compensated MASH, previously followed by [facility]. No recent follow-up or management. Discussed the need for regular monitoring to prevent complications. 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 Tomasa Rand, MD  Gastroenterology  I spent a total of 35 minutes reviewing the patient's medical record, interviewing and examining the patient, discussing his diagnosis and management of his condition going forward, and documenting in the medical record    Crist Fat, MD

## 2023-07-11 NOTE — Patient Instructions (Signed)
Your provider has requested that you go to the basement level for lab work before leaving today. Press "B" on the elevator. The lab is located at the first door on the left as you exit the elevator.  You have been scheduled for an abdominal ultrasound at [facility] (1st floor of hospital) on 07/17/23 at 10:30am. Please arrive 30 minutes prior to your appointment for registration. Make certain not to have anything to eat or drink 6 hours prior to your appointment. Should you need to reschedule your appointment, please contact radiology at [phone]. This test typically takes about 30 minutes to perform.   You have been scheduled for a gastric emptying scan at [facility] on 07/24/23 at 8am. Please arrive at least 30 minutes prior to your appointment for registration. Please make certain not to have anything to eat or drink after midnight the night before your test. Hold all stomach medications (ex: Zofran, phenergan, Reglan) 24 hours prior to your test. If you need to reschedule your appointment, please contact radiology scheduling at [phone]. _____________________________________________________________________ A gastric-emptying study measures how long it takes for food to move through your stomach. There are several ways to measure stomach emptying. In the most common test, you eat food that contains a small amount of radioactive material. A scanner that detects the movement of the radioactive material is placed over your abdomen to monitor the rate at which food leaves your stomach. This test normally takes about 4 hours to complete.  _______________________________________________________  If your blood pressure at your visit was 140/90 or greater, please contact your primary care physician to follow up on this.  _______________________________________________________  If you are age 39 or older, your body mass index should be between 23-30. Your Body mass index is 27.19  kg/m. If this is out of the aforementioned range listed, please consider follow up with your Primary Care Provider.  If you are age 89 or younger, your body mass index should be between 19-25. Your Body mass index is 27.19 kg/m. If this is out of the aformentioned range listed, please consider follow up with your Primary Care Provider.   ________________________________________________________  The [facility] providers would like to encourage you to use [facility] to communicate with providers for non-urgent requests or questions.  Due to long hold times on the telephone, sending your provider a message by [facility] may be a faster and more efficient way to get a response.  Please allow 48 business hours for a response.  Please remember that this is for non-urgent requests.   It was a pleasure to see you today!  Thank you for trusting me with your gastrointestinal care!    Scott E.Tomasa Rand, MD

## 2023-07-13 LAB — AFP TUMOR MARKER: AFP-Tumor Marker: 3.7 ng/mL (ref <6.1)

## 2023-07-17 ENCOUNTER — Other Ambulatory Visit: Payer: Self-pay

## 2023-07-17 ENCOUNTER — Ambulatory Visit (HOSPITAL_COMMUNITY)
Admission: RE | Admit: 2023-07-17 | Discharge: 2023-07-17 | Disposition: A | Discharge status: Home / Self Care | Payer: Medicare HMO | Source: Ambulatory Visit | Attending: Gastroenterology | Admitting: Gastroenterology

## 2023-07-17 DIAGNOSIS — K7581 Nonalcoholic steatohepatitis (NASH): Secondary | ICD-10-CM | POA: Insufficient documentation

## 2023-07-17 MED ORDER — ROSUVASTATIN CALCIUM 10 MG PO TABS: 10.0000 mg | ORAL_TABLET | Freq: Every day | ORAL | 11 refills | Status: DC

## 2023-07-17 NOTE — Progress Notes (Signed)
New Rx

## 2023-07-17 NOTE — Progress Notes (Signed)
His labs look good. His cholesterol is still not controlled. Call him in crestor 10mg  at bedtime (we are increasing his dose).   Patient is aware of labs, and also new dose of medication

## 2023-07-18 LAB — PANCREATIC ELASTASE, FECAL: Pancreatic Elastase-1, Stool: 562 ug/g (ref >200)

## 2023-07-20 ENCOUNTER — Encounter: Payer: Self-pay | Admitting: Gastroenterology

## 2023-07-20 NOTE — Progress Notes (Signed)
Mr. Candelaria, Your INR was essentially normal.  Your fecal elastase which assesses for pancreatic insufficiency was also normal.  Your AFP level (a tumor marker for liver cancer) was also normal.

## 2023-07-24 ENCOUNTER — Encounter (HOSPITAL_COMMUNITY)
Admission: RE | Admit: 2023-07-24 | Discharge: 2023-07-24 | Disposition: A | Discharge status: Home / Self Care | Payer: Medicare HMO | Source: Ambulatory Visit | Attending: Gastroenterology | Admitting: Gastroenterology

## 2023-07-24 DIAGNOSIS — Z0389 Encounter for observation for other suspected diseases and conditions ruled out: Secondary | ICD-10-CM | POA: Diagnosis not present

## 2023-07-24 DIAGNOSIS — R112 Nausea with vomiting, unspecified: Secondary | ICD-10-CM | POA: Diagnosis not present

## 2023-07-24 DIAGNOSIS — K7581 Nonalcoholic steatohepatitis (NASH): Secondary | ICD-10-CM | POA: Diagnosis not present

## 2023-07-24 MED ORDER — TECHNETIUM TC 99M SULFUR COLLOID
2.0800 | Freq: Once | INTRAVENOUS | Status: AC | PRN
  Administered 2023-07-24: 2.08 via ORAL

## 2023-07-26 ENCOUNTER — Encounter: Payer: Self-pay | Admitting: Gastroenterology

## 2023-07-26 NOTE — Progress Notes (Signed)
 Kristopher [name],  Your gastric emptying study was normal.  There was no evidence of impaired emptying (gastroparesis). As discussed, we can consider other medications to help with your symptoms, but they may come with undesired side effects.  Please keep a symptom diary to see if there may be dietary triggers for your vomiting episodes.

## 2023-07-30 ENCOUNTER — Encounter: Payer: Self-pay | Admitting: Gastroenterology

## 2023-08-01 ENCOUNTER — Other Ambulatory Visit: Payer: Self-pay | Admitting: Internal Medicine

## 2023-08-09 DIAGNOSIS — H2512 Age-related nuclear cataract, left eye: Secondary | ICD-10-CM | POA: Diagnosis not present

## 2023-08-10 DIAGNOSIS — H2511 Age-related nuclear cataract, right eye: Secondary | ICD-10-CM | POA: Diagnosis not present

## 2023-08-10 DIAGNOSIS — H25041 Posterior subcapsular polar age-related cataract, right eye: Secondary | ICD-10-CM | POA: Diagnosis not present

## 2023-08-10 DIAGNOSIS — H25011 Cortical age-related cataract, right eye: Secondary | ICD-10-CM | POA: Diagnosis not present

## 2023-08-14 ENCOUNTER — Emergency Department (HOSPITAL_COMMUNITY): Payer: Medicare HMO

## 2023-08-14 ENCOUNTER — Encounter (HOSPITAL_COMMUNITY): Payer: Self-pay

## 2023-08-14 ENCOUNTER — Emergency Department (HOSPITAL_COMMUNITY)
Admission: EM | Admit: 2023-08-14 | Discharge: 2023-08-15 | Disposition: A | Discharge status: Home / Self Care | Payer: Medicare HMO | Attending: Emergency Medicine | Admitting: Emergency Medicine

## 2023-08-14 DIAGNOSIS — I2510 Atherosclerotic heart disease of native coronary artery without angina pectoris: Secondary | ICD-10-CM | POA: Diagnosis not present

## 2023-08-14 DIAGNOSIS — R404 Transient alteration of awareness: Secondary | ICD-10-CM | POA: Diagnosis not present

## 2023-08-14 DIAGNOSIS — Z7984 Long term (current) use of oral hypoglycemic drugs: Secondary | ICD-10-CM | POA: Insufficient documentation

## 2023-08-14 DIAGNOSIS — R410 Disorientation, unspecified: Secondary | ICD-10-CM | POA: Diagnosis not present

## 2023-08-14 DIAGNOSIS — E1142 Type 2 diabetes mellitus with diabetic polyneuropathy: Secondary | ICD-10-CM | POA: Diagnosis not present

## 2023-08-14 DIAGNOSIS — I6782 Cerebral ischemia: Secondary | ICD-10-CM | POA: Diagnosis not present

## 2023-08-14 DIAGNOSIS — R4182 Altered mental status, unspecified: Secondary | ICD-10-CM | POA: Insufficient documentation

## 2023-08-14 DIAGNOSIS — Z7982 Long term (current) use of aspirin: Secondary | ICD-10-CM | POA: Diagnosis not present

## 2023-08-14 DIAGNOSIS — E1159 Type 2 diabetes mellitus with other circulatory complications: Secondary | ICD-10-CM | POA: Diagnosis not present

## 2023-08-14 DIAGNOSIS — I672 Cerebral atherosclerosis: Secondary | ICD-10-CM | POA: Diagnosis not present

## 2023-08-14 DIAGNOSIS — I10 Essential (primary) hypertension: Secondary | ICD-10-CM | POA: Diagnosis not present

## 2023-08-14 DIAGNOSIS — R29818 Other symptoms and signs involving the nervous system: Secondary | ICD-10-CM | POA: Diagnosis not present

## 2023-08-14 LAB — RAPID URINE DRUG SCREEN, HOSP PERFORMED
Amphetamines: NOT DETECTED
Barbiturates: NOT DETECTED
Benzodiazepines: NOT DETECTED
Cocaine: NOT DETECTED
Opiates: NOT DETECTED
Tetrahydrocannabinol: POSITIVE — AB

## 2023-08-14 LAB — URINALYSIS, ROUTINE W REFLEX MICROSCOPIC
Bacteria, UA: NONE SEEN
Bilirubin Urine: NEGATIVE
Glucose, UA: >=500 mg/dL — AB
Ketones, ur: NEGATIVE mg/dL
Leukocytes,Ua: NEGATIVE
Nitrite: NEGATIVE
Protein, ur: 30 mg/dL — AB
Specific Gravity, Urine: 1.02 (ref 1.005–1.030)
pH: 5 (ref 5.0–8.0)

## 2023-08-14 LAB — CBC
HCT: 35 % — ABNORMAL LOW (ref 39.0–52.0)
Hemoglobin: 11.8 g/dL — ABNORMAL LOW (ref 13.0–17.0)
MCH: 32.9 pg (ref 26.0–34.0)
MCHC: 33.7 g/dL (ref 30.0–36.0)
MCV: 97.5 fL (ref 80.0–100.0)
Platelets: 146 10*3/uL — ABNORMAL LOW (ref 150–400)
RBC: 3.59 MIL/uL — ABNORMAL LOW (ref 4.22–5.81)
RDW: 13.5 % (ref 11.5–15.5)
WBC: 6.2 10*3/uL (ref 4.0–10.5)
nRBC: 0 % (ref 0.0–0.2)

## 2023-08-14 LAB — COMPREHENSIVE METABOLIC PANEL
ALT: 23 U/L (ref 0–44)
AST: 24 U/L (ref 15–41)
Albumin: 4.2 g/dL (ref 3.5–5.0)
Alkaline Phosphatase: 60 U/L (ref 38–126)
Anion gap: 12 (ref 5–15)
BUN: 26 mg/dL — ABNORMAL HIGH (ref 8–23)
CO2: 26 mmol/L (ref 22–32)
Calcium: 9.5 mg/dL (ref 8.9–10.3)
Chloride: 101 mmol/L (ref 98–111)
Creatinine, Ser: 0.99 mg/dL (ref 0.61–1.24)
GFR, Estimated: >60 mL/min (ref >60)
Glucose, Bld: 211 mg/dL — ABNORMAL HIGH (ref 70–99)
Potassium: 4.1 mmol/L (ref 3.5–5.1)
Sodium: 139 mmol/L (ref 135–145)
Total Bilirubin: 0.9 mg/dL (ref 0.0–1.2)
Total Protein: 7.1 g/dL (ref 6.5–8.1)

## 2023-08-14 LAB — CBG MONITORING, ED: Glucose-Capillary: 213 mg/dL — ABNORMAL HIGH (ref 70–99)

## 2023-08-14 LAB — AMMONIA: Ammonia: 14 umol/L (ref 9–35)

## 2023-08-14 LAB — ETHANOL: Alcohol, Ethyl (B): <10 mg/dL (ref <10)

## 2023-08-14 NOTE — ED Provider Triage Note (Signed)
 Emergency Medicine Provider Triage Evaluation Note  Kristopher [name] , a 73 y.o. male  was evaluated in triage.  Pt complains of altered mental status.  Patient's wife reports that when she returned home from work this evening at around 7:30 PM, she found has been altered and naked.  She lost outpatient normal around 6 AM this morning.  Patient has known history of nonalcoholic cirrhosis as well as hypotension.  Patient denied to be hypertensive here in the emergency department with a headache.  Review of Systems  Positive: As above Negative: As above  Physical Exam  BP (!) 154/94 (BP Location: Left Arm)   Pulse 87   Temp 97.8 F (36.6 C) (Oral)   Resp 17   SpO2 97%  Gen:   Awake, no distress, answering questions appropriately Resp:  Normal effort  MSK:   Moves extremities without difficulty  Other:  Patient with no scleral icterus, asterixis, answering questions appropriately  Medical Decision Making  Medically screening exam initiated at 9:36 PM.  Appropriate orders placed.  Kristopher [name] was informed that the remainder of the evaluation will be completed by another provider, this initial triage assessment does not replace that evaluation, and the importance of remaining in the ED until their evaluation is complete.     Smitty Knudsen, PA-C 08/14/23 2138

## 2023-08-14 NOTE — ED Triage Notes (Signed)
 Pt BIB wife, states that she got home around 730 pm and found husband altered and naked, she last seen him normal at 6 am this morning. Pt is hypertensive which is not normal for him. Pt is able to answer question appropriately and neuro intact bilaterally. C/o of a headache.

## 2023-08-15 ENCOUNTER — Encounter (HOSPITAL_COMMUNITY): Payer: Self-pay | Admitting: Emergency Medicine

## 2023-08-15 ENCOUNTER — Emergency Department (HOSPITAL_COMMUNITY): Payer: Medicare HMO

## 2023-08-15 ENCOUNTER — Other Ambulatory Visit: Payer: Self-pay

## 2023-08-15 DIAGNOSIS — I6782 Cerebral ischemia: Secondary | ICD-10-CM | POA: Diagnosis not present

## 2023-08-15 DIAGNOSIS — R29818 Other symptoms and signs involving the nervous system: Secondary | ICD-10-CM | POA: Diagnosis not present

## 2023-08-15 DIAGNOSIS — I10 Essential (primary) hypertension: Secondary | ICD-10-CM | POA: Diagnosis not present

## 2023-08-15 MED ORDER — DIAZEPAM 5 MG/ML IJ SOLN: 2.5000 mg | Freq: Once | INTRAMUSCULAR | Status: DC

## 2023-08-15 MED ORDER — MIDAZOLAM HCL 2 MG/2ML IJ SOLN
2.0000 mg | Freq: Once | INTRAMUSCULAR | Status: AC
  Administered 2023-08-15: 2 mg via INTRAVENOUS
  Filled 2023-08-15: qty 2

## 2023-08-15 MED ORDER — ROPINIROLE HCL 1 MG PO TABS
2.0000 mg | ORAL_TABLET | Freq: Once | ORAL | Status: AC
  Administered 2023-08-15: 2 mg via ORAL
  Filled 2023-08-15: qty 2

## 2023-08-15 MED ORDER — LORAZEPAM 2 MG/ML IJ SOLN
1.0000 mg | Freq: Once | INTRAMUSCULAR | Status: AC
  Administered 2023-08-15: 1 mg via INTRAVENOUS
  Filled 2023-08-15: qty 1

## 2023-08-15 MED ORDER — GADOBUTROL 1 MMOL/ML IV SOLN
7.5000 mL | Freq: Once | INTRAVENOUS | Status: AC | PRN
  Administered 2023-08-15: 7.5 mL via INTRAVENOUS

## 2023-08-15 MED ORDER — SODIUM CHLORIDE 0.9 % IV BOLUS
1000.0000 mL | Freq: Once | INTRAVENOUS | Status: AC
  Administered 2023-08-15: 1000 mL via INTRAVENOUS

## 2023-08-15 NOTE — ED Provider Notes (Signed)
 [place] EMERGENCY DEPARTMENT AT [facility] Provider Note  CSN: [id number] Arrival date & time: 08/14/23 2059  Chief Complaint(s) Altered Mental Status  HPI Kristopher [name] is a 73 y.o. male with a past medical history listed below who presents to the emergency department for altered mental status.  Patient was last seen normal at 6 AM this morning by the wife.  When she got home around 7 PM, she noted patient was altered and confused.  She noted changes around the house that were concerning including wet clothing and things out of order.  In the morning when she left, patient was complaining of vertiginous symptoms which currently he reports he gets from time to time.  She states that since arriving, he has improved but still is saying nonsensical things from time to time.  They deny any recent infections.  Patient does use CBD and vapes delta 8 but does not reportedly abuse it.  They deny any alcohol use.  No trauma.  No focal deficits.  Currently patient has no complaints.  The history is provided by the patient and the spouse.    Past Medical History Past Medical History:  Diagnosis Date   Anemia 12/2020   Aortic atherosclerosis (HCC)    Atrial fibrillation (HCC)    a. s/p PVI X2 by Dr Macon Large at [facility] around 2000.   CAD (coronary artery disease)    a. NSTEMI (06/2013):  LHC (06/27/2013):  pLAD 10-20, RCA 90, EF 45-50%, inf HK.  PCI:  Xience Alpine (3 x 23 mm) DES to the RCA. b. Relook cath 07/10/13 - patent stent. Imdur added. c. Recurrent CP after that - abnormal nuc 07/2017 with chest pain, dyspnea, hypotensive response. Rest images with mid-basal inferior thinning. No stress images due to abrupt d/c of test. Relook cath 3/12 - no significant r   Chronic back pain greater than 3 months duration    Cirrhosis, non-alcoholic (HCC)    Depression    DJD (degenerative joint disease) of cervical spine    "3 herniated discs" (09/01/2013)   DJD (degenerative joint disease),  lumbosacral    "bulging L5-S1" (09/01/2013)   Dysrhythmia    PVCs. A.fib with two ablations   Herniated cervical disc    c2-T1 fusion   History of kidney stones    turned into urosepsis   HLD (hyperlipidemia)    Ischemic cardiomyopathy    a. Echo (06/28/2011): Inferior and inferoseptal akinesis, EF 40%, grade 2 diastolic dysfunction, MAC, small effusion (no tamponade). B. EF 40% by echo 06/2013, cath 07/2013, 55% by cath 08/2013 with subtle mild mid inferior hypocontractility.   Liver cirrhosis secondary to NASH [facility])    Lumbar herniated disc    Neuromuscular disorder (HCC)    bilateral neuropathy legs   NSTEMI (non-ST elevated myocardial infarction) (HCC) 06/27/2013   Osteoarthritis    "neck and lower back" (09/01/2013)   Pericardial effusion    a. Chronic since hx of pericarditis in 2004. b. Mod by echo 06/2013, small by echo 06/2013.   Restless leg syndrome    Type II diabetes mellitus [facility])    Patient Active Problem List   Diagnosis Date Noted   Benign prostatic hyperplasia without lower urinary tract symptoms 07/09/2023   BMI 27.0-27.9,adult 07/09/2023   Type 2 diabetes mellitus with other circulatory complications (HCC) 01/03/2023   OSA (obstructive sleep apnea) 11/22/2022   Wheezing 07/17/2022   Atrial fibrillation (HCC) 07/17/2022   Diabetic polyneuropathy associated with type 2 diabetes mellitus (HCC) 06/30/2022  Aortic atherosclerosis (HCC) 06/30/2022   Herniated disc, cervical 06/30/2022   Lumbar herniated disc 06/30/2022   Vitamin B12 deficiency 06/30/2022   BMI 25.0-25.9,adult 06/30/2022   Iron (Fe) deficiency anemia 03/13/2022   Iron deficiency anemia, unspecified 02/23/2022   Nausea and vomiting 12/13/2021   Constipation 12/13/2021   Spondylolisthesis of lumbar region 12/08/2021   Hypokalemia    Hypomagnesemia    Hypercholesterolemia    Acute cephalic vein thrombosis, right    Dyspnea    Atrial fibrillation with RVR (HCC)    Anemia 02/23/2021   Nephrolithiasis  02/11/2021   Syncope 09/18/2019   Encounter for general adult medical examination with abnormal findings 04/30/2018   Muscle spasm 04/30/2018   Dysuria 04/30/2018   Cirrhosis (HCC) 12/17/2015   Pericardial effusion 07/22/2014   Restless legs syndrome 06/26/2014   Other amnesia 06/04/2014   Coronary artery disease involving native coronary artery of native heart without angina pectoris 04/13/2014   Cardiomyopathy, ischemic 04/13/2014   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   Obstructive sleep apnea syndrome 03/12/2014   Acute pericarditis, unspecified - possible 09/04/2013   Exertional chest pain 09/04/2013   Chest pain with moderate risk for cardiac etiology - etiology likely pericarditis 09/01/2013   Abnormal exercise tolerance test 08/26/2013   Depression 08/07/2013   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   Atrial arrhythmia    Non-ST elevation myocardial infarction (NSTEMI), subendocardial infarction, subsequent episode of care [facility]) 06/27/2013    Class: History of   Home Medication(s) Prior to Admission medications   Medication Sig Start Date End Date Taking? Authorizing Provider  aspirin EC 81 MG tablet Take 81 mg by mouth every evening.     [provider]  baclofen (LIORESAL) 10 MG tablet TAKE 1 TABLET BY MOUTH THREE TIMES A DAY AS NEEDED FOR MUSCLE SPASMS 08/02/23   Leonia Reader, Barbara Cower, MD  diltiazem (CARDIZEM SR) 120 MG 12 hr capsule Take 120 mg by mouth every 36 (thirty-six) hours. 04/21/21   [provider]  escitalopram (LEXAPRO) 20 MG tablet TAKE 1 TABLET BY MOUTH EVERY DAY 06/25/23   Leonia Reader, Barbara Cower, MD  gabapentin (NEURONTIN) 600 MG tablet Take 1 tablet (600 mg total) by mouth 3 (three) times daily. 07/17/22   Crist Fat, MD  metFORMIN (GLUCOPHAGE-XR) 500 MG 24 hr tablet Take 1,000 mg by mouth 2 (two) times daily. 07/25/19   [provider]  Misc Natural Products (PUMPKIN SEED OIL) CAPS Take 1 capsule by mouth daily. 3000 mg     [provider]  niacin 500 MG tablet Take 500 mg by mouth at bedtime.    [provider]  pantoprazole (PROTONIX) 40 MG tablet Take 1 tablet (40 mg total) by mouth daily. 06/23/22   Crist Fat, MD  promethazine (PHENERGAN) 25 MG tablet Take 1 tablet (25 mg total) by mouth every 6 (six) hours as needed. 07/09/23   Crist Fat, MD  rOPINIRole (REQUIP) 1 MG tablet TAKE 1 TABLET BY MOUTH 1 TO 3 HOURS BEFORE BEDTIME 10/16/22   Crist Fat, MD  rosuvastatin (CRESTOR) 10 MG tablet Take 1 tablet (10 mg total) by mouth daily. 07/17/23 07/16/24  Crist Fat, MD  Allergies Codeine, Duragesic-100 [fentanyl], Hydrocodone-acetaminophen, Naprosyn [naproxen], and Other  Review of Systems Review of Systems As noted in HPI  Physical Exam Vital Signs  I have reviewed the triage vital signs BP 139/84   Pulse 67   Temp 98.2 F (36.8 C) (Oral)   Resp 18   Ht 5\' 7"  (1.702 m)   Wt 74.8 kg   SpO2 93%   BMI 25.84 kg/m   Physical Exam Vitals reviewed.  Constitutional:      General: He is not in acute distress.    Appearance: He is well-developed. He is not diaphoretic.  HENT:     Head: Normocephalic and atraumatic.     Right Ear: External ear normal.     Left Ear: External ear normal.     Nose: Nose normal.     Mouth/Throat:     Mouth: Mucous membranes are moist.  Eyes:     General: No scleral icterus.    Conjunctiva/sclera: Conjunctivae normal.  Neck:     Trachea: Phonation normal.  Cardiovascular:     Rate and Rhythm: Normal rate and regular rhythm.  Pulmonary:     Effort: Pulmonary effort is normal. No respiratory distress.     Breath sounds: No stridor.  Abdominal:     General: There is no distension.  Musculoskeletal:        General: Normal range of motion.     Cervical back: Normal range of motion.  Neurological:     Mental  Status: He is alert and oriented to person, place, and time.     Cranial Nerves: Cranial nerves 2-12 are intact.     Sensory: Sensation is intact.     Motor: Motor function is intact.     Coordination: Coordination is intact.  Psychiatric:        Behavior: Behavior normal.     ED Results and Treatments Labs (all labs ordered are listed, but only abnormal results are displayed) Labs Reviewed  COMPREHENSIVE METABOLIC PANEL - Abnormal; Notable for the following components:      Result Value   Glucose, Bld 211 (*)    BUN 26 (*)    All other components within normal limits  CBC - Abnormal; Notable for the following components:   RBC 3.59 (*)    Hemoglobin 11.8 (*)    HCT 35.0 (*)    Platelets 146 (*)    All other components within normal limits  URINALYSIS, ROUTINE W REFLEX MICROSCOPIC - Abnormal; Notable for the following components:   Glucose, UA >=500 (*)    Hgb urine dipstick SMALL (*)    Protein, ur 30 (*)    All other components within normal limits  RAPID URINE DRUG SCREEN, HOSP PERFORMED - Abnormal; Notable for the following components:   Tetrahydrocannabinol POSITIVE (*)    All other components within normal limits  CBG MONITORING, ED - Abnormal; Notable for the following components:   Glucose-Capillary 213 (*)    All other components within normal limits  AMMONIA  ETHANOL  EKG  EKG Interpretation Date/Time:  Tuesday August 14 2023 22:34:41 EST Ventricular Rate:  82 PR Interval:  154 QRS Duration:  88 QT Interval:  388 QTC Calculation: 454 R Axis:   75  Text Interpretation: Sinus rhythm Multiple ventricular premature complexes Borderline T wave abnormalities Otherwise no significant change Confirmed by Drema Pry [phone]) on 08/14/2023 11:04:37 PM       Radiology MR Brain W and Wo Contrast Result Date: 08/15/2023 CLINICAL DATA:  Neuro  deficit with acute stroke suspected. Hypertension EXAM: MRI HEAD WITHOUT AND WITH CONTRAST TECHNIQUE: Multiplanar, multiecho pulse sequences of the brain and surrounding structures were obtained without and with intravenous contrast. CONTRAST:  7.79mL GADAVIST GADOBUTROL 1 MMOL/ML IV SOLN COMPARISON:  02/26/2021 FINDINGS: Brain: No acute infarction, hemorrhage, hydrocephalus, extra-axial collection or mass lesion. Small vessel ischemic gliosis in the cerebral white matter without progression from 2022, mild-to-moderate. Brain volume is preserved from prior scan and for patient age. No abnormal enhancement. Vascular: Major flow voids and vascular enhancements are preserved Skull and upper cervical spine: No marrow signal abnormality. Multilevel cervical fusion which is partially covered. Sinuses/Orbits: No acute finding Other: Intermittently significant motion degradation. IMPRESSION: 1. No acute or reversible finding. 2. Chronic small vessel disease that is stable from 2022. 3. Motion artifact. Electronically Signed   By: Tiburcio Pea M.D.   On: 08/15/2023 05:06   CT Head Wo Contrast Result Date: 08/14/2023 CLINICAL DATA:  Altered level of consciousness, last known well 6 a.m., hypertension EXAM: CT HEAD WITHOUT CONTRAST TECHNIQUE: Contiguous axial images were obtained from the base of the skull through the vertex without intravenous contrast. RADIATION DOSE REDUCTION: This exam was performed according to the departmental dose-optimization program which includes automated exposure control, adjustment of the mA and/or kV according to patient size and/or use of iterative reconstruction technique. COMPARISON:  02/26/2021 FINDINGS: Brain: No acute infarct or hemorrhage. Stable chronic small-vessel ischemic changes in the periventricular white matter. The lateral ventricles and midline structures are otherwise unremarkable. No acute extra-axial fluid collections. No mass effect. Vascular: Stable atherosclerosis.  No  hyperdense vessel. Skull: Normal. Negative for fracture or focal lesion. Sinuses/Orbits: No acute finding. Other: None. IMPRESSION: 1. No acute intracranial process. Electronically Signed   By: Sharlet Salina M.D.   On: 08/14/2023 22:29   DG Chest Portable 1 View Result Date: 08/14/2023 CLINICAL DATA:  Altered mental status EXAM: PORTABLE CHEST 1 VIEW COMPARISON:  07/17/2022 FINDINGS: Heart and mediastinal contours are within normal limits. No focal opacities or effusions. No acute bony abnormality. Loop recorder device again noted. IMPRESSION: No active disease. Electronically Signed   By: Charlett Nose M.D.   On: 08/14/2023 22:12    Medications Ordered in ED Medications  sodium chloride 0.9 % bolus 1,000 mL (0 mLs Intravenous Stopped 08/15/23 0203)  LORazepam (ATIVAN) injection 1 mg (1 mg Intravenous Given 08/15/23 0206)  gadobutrol (GADAVIST) 1 MMOL/ML injection 7.5 mL (7.5 mLs Intravenous Contrast Given 08/15/23 0442)  rOPINIRole (REQUIP) tablet 2 mg (2 mg Oral Given 08/15/23 0310)  midazolam (VERSED) injection 2 mg (2 mg Intravenous Given 08/15/23 5465)   Procedures Procedures  (including critical care time) Medical Decision Making / ED Course   Medical Decision Making Amount and/or Complexity of Data Reviewed Labs: ordered. Decision-making details documented in ED Course. Radiology: ordered and independent interpretation performed. Decision-making details documented in ED Course. ECG/medicine tests: ordered and independent interpretation performed.  Risk Prescription drug management.    Confusion  Differential diagnosis considered and workup below  CBC without leukocytosis.  No significant anemia.  Metabolic panel without significant electrolyte derangements or renal sufficiency.  No evidence of bili obstruction.  Bilirubin normal.  UA without evidence of infection.  UDS is positive for THC.  EtOH negative.  Ammonia obtain given patient's history of Elita Boone, which is negative.  CT  head without evidence of mass effect or ICH.  MRI obtained to rule out stroke was negative.  No prior h/o seizures. Possible polypharm vs THC overuse.  Patient ambulated well w/o assistance.    Final Clinical Impression(s) / ED Diagnoses Final diagnoses:  Disorientation   The patient appears reasonably screened and/or stabilized for discharge and I doubt any other medical condition or other [facility] requiring further screening, evaluation, or treatment in the ED at this time. I have discussed the findings, Dx and Tx plan with the patient/family who expressed understanding and agree(s) with the plan. Discharge instructions discussed at length. The patient/family was given strict return precautions who verbalized understanding of the instructions. No further questions at time of discharge.  Disposition: Discharge  Condition: Good  ED Discharge Orders     None        Follow Up: Crist Fat, MD [address] [place] [address] [phone]  Call  to schedule an appointment for close follow up     This chart was dictated using voice recognition software.  Despite best efforts to proofread,  errors can occur which can change the documentation meaning.    Nira Conn, MD 08/15/23 [phone]

## 2023-08-15 NOTE — ED Provider Notes (Incomplete)
 [place] EMERGENCY DEPARTMENT AT [facility] Provider Note  CSN: [id number] Arrival date & time: 08/14/23 2059  Chief Complaint(s) Altered Mental Status  HPI Kristopher [name] is a 73 y.o. male {Add pertinent medical, surgical, social history, OB history to HPI:1}    Altered Mental Status   Past Medical History Past Medical History:  Diagnosis Date  . Anemia 12/2020  . Aortic atherosclerosis (HCC)   . Atrial fibrillation (HCC)    a. s/p PVI X2 by Dr Macon Large at [facility] around 2000.  Marland Kitchen CAD (coronary artery disease)    a. NSTEMI (06/2013):  LHC (06/27/2013):  pLAD 10-20, RCA 90, EF 45-50%, inf HK.  PCI:  Xience Alpine (3 x 23 mm) DES to the RCA. b. Relook cath 07/10/13 - patent stent. Imdur added. c. Recurrent CP after that - abnormal nuc 07/2017 with chest pain, dyspnea, hypotensive response. Rest images with mid-basal inferior thinning. No stress images due to abrupt d/c of test. Relook cath 3/12 - no significant r  . Chronic back pain greater than 3 months duration   . Cirrhosis, non-alcoholic (HCC)   . Depression   . DJD (degenerative joint disease) of cervical spine    "3 herniated discs" (09/01/2013)  . DJD (degenerative joint disease), lumbosacral    "bulging L5-S1" (09/01/2013)  . Dysrhythmia    PVCs. A.fib with two ablations  . Herniated cervical disc    c2-T1 fusion  . History of kidney stones    turned into urosepsis  . HLD (hyperlipidemia)   . Ischemic cardiomyopathy    a. Echo (06/28/2011): Inferior and inferoseptal akinesis, EF 40%, grade 2 diastolic dysfunction, MAC, small effusion (no tamponade). B. EF 40% by echo 06/2013, cath 07/2013, 55% by cath 08/2013 with subtle mild mid inferior hypocontractility.  . Liver cirrhosis secondary to NASH (HCC)   . Lumbar herniated disc   . Neuromuscular disorder (HCC)    bilateral neuropathy legs  . NSTEMI (non-ST elevated myocardial infarction) (HCC) 06/27/2013  . Osteoarthritis    "neck and lower back" (09/01/2013)  .  Pericardial effusion    a. Chronic since hx of pericarditis in 2004. b. Mod by echo 06/2013, small by echo 06/2013.  Marland Kitchen Restless leg syndrome   . Type II diabetes mellitus [facility])    Patient Active Problem List   Diagnosis Date Noted  . Benign prostatic hyperplasia without lower urinary tract symptoms 07/09/2023  . BMI 27.0-27.9,adult 07/09/2023  . Type 2 diabetes mellitus with other circulatory complications (HCC) 01/03/2023  . OSA (obstructive sleep apnea) 11/22/2022  . Wheezing 07/17/2022  . Atrial fibrillation (HCC) 07/17/2022  . Diabetic polyneuropathy associated with type 2 diabetes mellitus (HCC) 06/30/2022  . Aortic atherosclerosis (HCC) 06/30/2022  . Herniated disc, cervical 06/30/2022  . Lumbar herniated disc 06/30/2022  . Vitamin B12 deficiency 06/30/2022  . BMI 25.0-25.9,adult 06/30/2022  . Iron (Fe) deficiency anemia 03/13/2022  . Iron deficiency anemia, unspecified 02/23/2022  . Nausea and vomiting 12/13/2021  . Constipation 12/13/2021  . Spondylolisthesis of lumbar region 12/08/2021  . Hypokalemia   . Hypomagnesemia   . Hypercholesterolemia   . Acute cephalic vein thrombosis, right   . Dyspnea   . Atrial fibrillation with RVR (HCC)   . Anemia 02/23/2021  . Nephrolithiasis 02/11/2021  . Syncope 09/18/2019  . Encounter for general adult medical examination with abnormal findings 04/30/2018  . Muscle spasm 04/30/2018  . Dysuria 04/30/2018  . Cirrhosis (HCC) 12/17/2015  . Pericardial effusion 07/22/2014  . Restless legs syndrome 06/26/2014  .  Other amnesia 06/04/2014  . Coronary artery disease involving native coronary artery of native heart without angina pectoris 04/13/2014  . Cardiomyopathy, ischemic 04/13/2014  . 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  . Obstructive sleep apnea syndrome 03/12/2014  . Acute pericarditis, unspecified - possible 09/04/2013  . Exertional chest pain 09/04/2013  . Chest pain with moderate risk for cardiac  etiology - etiology likely pericarditis 09/01/2013  . Abnormal exercise tolerance test 08/26/2013  . Depression 08/07/2013  . 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  . Atrial arrhythmia   . Non-ST elevation myocardial infarction (NSTEMI), subendocardial infarction, subsequent episode of care [facility]) 06/27/2013    Class: History of   Home Medication(s) Prior to Admission medications   Medication Sig Start Date End Date Taking? Authorizing Provider  aspirin EC 81 MG tablet Take 81 mg by mouth every evening.     [provider]  baclofen (LIORESAL) 10 MG tablet TAKE 1 TABLET BY MOUTH THREE TIMES A DAY AS NEEDED FOR MUSCLE SPASMS 08/02/23   Leonia Reader, Barbara Cower, MD  diltiazem (CARDIZEM SR) 120 MG 12 hr capsule Take 120 mg by mouth every 36 (thirty-six) hours. 04/21/21   [provider]  escitalopram (LEXAPRO) 20 MG tablet TAKE 1 TABLET BY MOUTH EVERY DAY 06/25/23   Leonia Reader, Barbara Cower, MD  gabapentin (NEURONTIN) 600 MG tablet Take 1 tablet (600 mg total) by mouth 3 (three) times daily. 07/17/22   Crist Fat, MD  metFORMIN (GLUCOPHAGE-XR) 500 MG 24 hr tablet Take 1,000 mg by mouth 2 (two) times daily. 07/25/19   [provider]  Misc Natural Products (PUMPKIN SEED OIL) CAPS Take 1 capsule by mouth daily. 3000 mg    [provider]  niacin 500 MG tablet Take 500 mg by mouth at bedtime.    [provider]  pantoprazole (PROTONIX) 40 MG tablet Take 1 tablet (40 mg total) by mouth daily. 06/23/22   Crist Fat, MD  promethazine (PHENERGAN) 25 MG tablet Take 1 tablet (25 mg total) by mouth every 6 (six) hours as needed. 07/09/23   Crist Fat, MD  rOPINIRole (REQUIP) 1 MG tablet TAKE 1 TABLET BY MOUTH 1 TO 3 HOURS BEFORE BEDTIME 10/16/22   Crist Fat, MD  rosuvastatin (CRESTOR) 10 MG tablet Take 1 tablet (10 mg total) by mouth daily. 07/17/23 07/16/24  Crist Fat, MD                                                                                                                                     Allergies Codeine, Duragesic-100 [fentanyl], Hydrocodone-acetaminophen, Naprosyn [naproxen], and Other  Review of Systems Review of Systems As noted in HPI  Physical Exam Vital Signs  I have reviewed the triage vital signs BP (!) 154/94 (BP Location: Left Arm)   Pulse 87   Temp 97.8 F (36.6 C) (Oral)   Resp 17   SpO2 97%  *** Physical Exam  ED Results and Treatments Labs (all labs ordered are listed, but only abnormal results are displayed) Labs Reviewed  COMPREHENSIVE METABOLIC PANEL - Abnormal; Notable for the following components:      Result Value   Glucose, Bld 211 (*)    BUN 26 (*)    All other components within normal limits  CBC - Abnormal; Notable for the following components:   RBC 3.59 (*)    Hemoglobin 11.8 (*)    HCT 35.0 (*)    Platelets 146 (*)    All other components within normal limits  URINALYSIS, ROUTINE W REFLEX MICROSCOPIC - Abnormal; Notable for the following components:   Glucose, UA >=500 (*)    Hgb urine dipstick SMALL (*)    Protein, ur 30 (*)    All other components within normal limits  RAPID URINE DRUG SCREEN, HOSP PERFORMED - Abnormal; Notable for the following components:   Tetrahydrocannabinol POSITIVE (*)    All other components within normal limits  CBG MONITORING, ED - Abnormal; Notable for the following components:   Glucose-Capillary 213 (*)    All other components within normal limits  AMMONIA  ETHANOL                                                                                                                         EKG  EKG Interpretation Date/Time:  Tuesday August 14 2023 22:34:41 EST Ventricular Rate:  82 PR Interval:  154 QRS Duration:  88 QT Interval:  388 QTC Calculation: 454 R Axis:   75  Text Interpretation: Sinus rhythm Multiple ventricular premature complexes Borderline T wave abnormalities Otherwise no significant change Confirmed by Drema Pry [phone]) on 08/14/2023 11:04:37  PM       Radiology CT Head Wo Contrast Result Date: 08/14/2023 CLINICAL DATA:  Altered level of consciousness, last known well 6 a.m., hypertension EXAM: CT HEAD WITHOUT CONTRAST TECHNIQUE: Contiguous axial images were obtained from the base of the skull through the vertex without intravenous contrast. RADIATION DOSE REDUCTION: This exam was performed according to the departmental dose-optimization program which includes automated exposure control, adjustment of the mA and/or kV according to patient size and/or use of iterative reconstruction technique. COMPARISON:  02/26/2021 FINDINGS: Brain: No acute infarct or hemorrhage. Stable chronic small-vessel ischemic changes in the periventricular white matter. The lateral ventricles and midline structures are otherwise unremarkable. No acute extra-axial fluid collections. No mass effect. Vascular: Stable atherosclerosis.  No hyperdense vessel. Skull: Normal. Negative for fracture or focal lesion. Sinuses/Orbits: No acute finding. Other: None. IMPRESSION: 1. No acute intracranial process. Electronically Signed   By: Sharlet Salina M.D.   On: 08/14/2023 22:29   DG Chest Portable 1 View Result Date: 08/14/2023 CLINICAL DATA:  Altered mental status EXAM: PORTABLE CHEST 1 VIEW COMPARISON:  07/17/2022 FINDINGS: Heart and mediastinal contours are within normal limits. No focal opacities or effusions. No acute bony abnormality. Loop recorder device again noted. IMPRESSION: No active disease. Electronically Signed   By: Caryn Bee  Dover M.D.   On: 08/14/2023 22:12    Medications Ordered in ED Medications - No data to display Procedures Procedures  (including critical care time) Medical Decision Making / ED Course   Medical Decision Making   ***    Final Clinical Impression(s) / ED Diagnoses Final diagnoses:  None    This chart was dictated using voice recognition software.  Despite best efforts to proofread,  errors can occur which can change the  documentation meaning.

## 2023-08-20 ENCOUNTER — Ambulatory Visit: Payer: Medicare HMO | Admitting: Internal Medicine

## 2023-08-20 ENCOUNTER — Encounter: Payer: Self-pay | Admitting: Internal Medicine

## 2023-08-20 VITALS — BP 100/62 | HR 81 | Temp 98.7°F | Resp 18 | Ht 67.0 in | Wt 178.2 lb

## 2023-08-20 DIAGNOSIS — R4182 Altered mental status, unspecified: Secondary | ICD-10-CM | POA: Insufficient documentation

## 2023-08-20 NOTE — Progress Notes (Signed)
 Office Visit  Subjective   Patient ID: Kristopher [name]   DOB: 14-Apr-1951   Age: 73 y.o.   MRN: [id number]   Chief Complaint Chief Complaint  Patient presents with   Acute Visit    Recent memory loss     History of Present Illness Kristopher [name] returns today for a ER followed where he was seen at [facility] ER on 08/14/2023 with altered mental status.  His wife saw him normal at 6AM that morning where he stated at that time he did not feel good and was complaining of vertiginous symptoms which currently he reports he gets from time to time.  She came back after work at 7 PM and found him confused.  There was no garbled or slurred speech but it was soft.  He was staying things that was not making sense.  He was unsteady but the patient had no focal weakness/numbness when she saw him.  She noted he RLS/periodic limb movement was worsened throughout this and remained like that for days.  She took him to the ER where they did lab testing.  He has a history of NASH cirrhosis but his ammonia was normal.  His other labs were normal but he was positive for THC on urine screening.  They did a CXR which was clear.  A CT of his head was done and this showed no acute intracranial process.  They also did a MRI of his brain on 08/15/2023 and this showed no acute or reversible finding with chronic small vessel disease that is stable from 2022.  He did receive Ativan and versed in the ER due to worsening jerking with his RLS.  His wife states this went on until now where he is dropping things at times.  The ER did not find a reason for this altered mental status and gave him the differential of possible polypharmacy vs THC overuse.  His wife tells me today he did not take his regular meds that morning as they were still left in his pill organizer.  He uses a throwaway THC vape and he has not had any problems with this current vape and he is still using this vape without any problems.  He has been having some fatigue and had an  episode of urinary incontinence that day.  The next day he had some diarrhea the next day.  The patient is on gabapentin 600mg  TID, baclofen 10mg  twice a day, lexapro 20mg  daily, and ropinrole 3mg  at bedtime.  He does not really take his promethazine.  He has a history of OSA and wears his CPAP as directed.       Past Medical History Past Medical History:  Diagnosis Date   Anemia 12/2020   Aortic atherosclerosis (HCC)    Atrial fibrillation (HCC)    a. s/p PVI X2 by Dr Macon Large at [facility] around 2000.   CAD (coronary artery disease)    a. NSTEMI (06/2013):  LHC (06/27/2013):  pLAD 10-20, RCA 90, EF 45-50%, inf HK.  PCI:  Xience Alpine (3 x 23 mm) DES to the RCA. b. Relook cath 07/10/13 - patent stent. Imdur added. c. Recurrent CP after that - abnormal nuc 07/2017 with chest pain, dyspnea, hypotensive response. Rest images with mid-basal inferior thinning. No stress images due to abrupt d/c of test. 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 A.fib with two ablations   Herniated cervical disc    c2-T1 fusion   History of kidney stones    turned into urosepsis   HLD (hyperlipidemia)    Ischemic cardiomyopathy    a. Echo (06/28/2011): Inferior and inferoseptal akinesis, EF 40%, grade 2 diastolic dysfunction, MAC, small effusion (no tamponade). B. EF 40% by echo 06/2013, cath 07/2013, 55% by cath 08/2013 with subtle mild mid inferior hypocontractility.   Liver cirrhosis secondary to NASH [facility])    Lumbar herniated disc    Neuromuscular disorder (HCC)    bilateral neuropathy legs   NSTEMI (non-ST elevated myocardial infarction) (HCC) 06/27/2013   Osteoarthritis    "neck and lower back" (09/01/2013)   Pericardial effusion     a. Chronic since hx of pericarditis in 2004. b. Mod by echo 06/2013, small by echo 06/2013.   Restless leg syndrome    Type II diabetes mellitus (HCC)      Allergies Allergies  Allergen Reactions   Codeine Nausea And Vomiting   Duragesic-100 [Fentanyl] Nausea And Vomiting   Hydrocodone-Acetaminophen Nausea And Vomiting   Naprosyn [Naproxen] Nausea Only   Other Nausea And Vomiting    1. N/V to all opiates. He is able to take versed. (noted 09/29/13) 2.The patient states that he has had  a reaction to all narcotics. He can take them though. (noted 06/26/13)     Medications  Current Outpatient Medications:    aspirin EC 81 MG tablet, Take 81 mg by mouth every evening. , Disp: , Rfl:    baclofen (LIORESAL) 10 MG tablet, TAKE 1 TABLET BY MOUTH THREE TIMES A DAY AS NEEDED FOR MUSCLE SPASMS, Disp: 270 tablet, Rfl: 1   diltiazem (CARDIZEM SR) 120 MG 12 hr capsule, Take 120 mg by mouth every 36 (thirty-six) hours., Disp: , Rfl:    escitalopram (LEXAPRO) 20 MG tablet, TAKE 1 TABLET BY MOUTH EVERY DAY, Disp: 90 tablet, Rfl: 1   gabapentin (NEURONTIN) 600 MG tablet, Take 1 tablet (600 mg total) by mouth 3 (three) times daily., Disp: 180 tablet, Rfl: 5   metFORMIN (GLUCOPHAGE-XR) 500 MG 24 hr tablet, Take 1,000 mg by mouth 2 (two) times daily., Disp: , Rfl:    Misc Natural Products (PUMPKIN SEED OIL) CAPS, Take 1 capsule by mouth daily. 3000 mg, Disp: , Rfl:    niacin 500 MG tablet, Take 500 mg by mouth at bedtime., Disp: , Rfl:    pantoprazole (PROTONIX) 40 MG tablet, Take 1 tablet (40 mg total) by mouth daily., Disp: 90 tablet, Rfl: 1   promethazine (PHENERGAN) 25 MG tablet, Take 1 tablet (25 mg total) by mouth every 6 (six) hours as needed., Disp: 30 tablet, Rfl: 5   rOPINIRole (REQUIP) 1 MG tablet, TAKE 1 TABLET BY MOUTH 1 TO 3 HOURS BEFORE BEDTIME, Disp: 90 tablet, Rfl: 1   rosuvastatin (CRESTOR) 10 MG tablet, Take 1 tablet (10 mg total) by mouth daily., Disp: 30 tablet, Rfl: 11   Review of  Systems Review of Systems  Constitutional:  Negative for chills and fever.  Respiratory:  Negative for cough and shortness of breath.   Cardiovascular:  Negative for chest pain, palpitations and leg swelling.  Gastrointestinal:  Negative for abdominal pain, constipation, diarrhea, nausea and vomiting.  Skin:  Negative for itching and rash.  Neurological:  Negative for dizziness, weakness and headaches.       Objective:    Vitals BP 100/62   Pulse 81   Temp 98.7 F (37.1 C)   Resp 18   Ht 5\' 7"  (1.702 m)   Wt 178 lb 3.2 oz (80.8 kg)   SpO2 98%   BMI 27.91 kg/m    Physical Examination Physical Exam Constitutional:      Appearance: Normal appearance. He is not ill-appearing.  Cardiovascular:     Rate and Rhythm: Normal rate and regular rhythm.     Pulses: Normal pulses.     Heart sounds: No murmur heard.    No friction rub. No gallop.  Pulmonary:     Effort: Pulmonary effort is normal. No respiratory distress.     Breath sounds: No wheezing, rhonchi or rales.  Abdominal:     General: Abdomen is flat. Bowel sounds are normal. There is no distension.     Palpations: Abdomen is soft.     Tenderness: There is no abdominal tenderness.  Musculoskeletal:     Right lower leg: No edema.     Left lower leg: No edema.  Skin:    General: Skin is warm and dry.     Findings: No rash.  Neurological:     Mental Status: He is alert.        Assessment & Plan:   Altered mental status I do not think he had polypharmacy.  He did not even take his medications that morning.  He is back to normal but he had some increased movement with his RLS.  I wonder if he had a viral infection at that time.  I reviewed his labs and xrays.      No follow-ups on file.   Crist Fat, MD

## 2023-08-20 NOTE — Assessment & Plan Note (Signed)
 I do not think he had polypharmacy.  He did not even take his medications that morning.  He is back to normal but he had some increased movement with his RLS.  I wonder if he had a viral infection at that time.  I reviewed his labs and xrays.

## 2023-09-06 DIAGNOSIS — H25011 Cortical age-related cataract, right eye: Secondary | ICD-10-CM | POA: Diagnosis not present

## 2023-09-06 DIAGNOSIS — H25041 Posterior subcapsular polar age-related cataract, right eye: Secondary | ICD-10-CM | POA: Diagnosis not present

## 2023-09-06 DIAGNOSIS — H2511 Age-related nuclear cataract, right eye: Secondary | ICD-10-CM | POA: Diagnosis not present

## 2023-09-07 DIAGNOSIS — H2511 Age-related nuclear cataract, right eye: Secondary | ICD-10-CM | POA: Diagnosis not present

## 2023-09-17 ENCOUNTER — Other Ambulatory Visit: Payer: Self-pay | Admitting: Internal Medicine

## 2023-10-01 ENCOUNTER — Other Ambulatory Visit: Payer: Self-pay | Admitting: Internal Medicine

## 2023-10-03 ENCOUNTER — Encounter: Payer: Self-pay | Admitting: Neurology

## 2023-10-03 ENCOUNTER — Ambulatory Visit: Payer: Medicare HMO | Admitting: Internal Medicine

## 2023-10-03 ENCOUNTER — Encounter: Payer: Self-pay | Admitting: Internal Medicine

## 2023-10-03 VITALS — BP 114/68 | HR 74 | Temp 98.2°F | Resp 17 | Ht 67.0 in | Wt 173.2 lb

## 2023-10-03 DIAGNOSIS — K7581 Nonalcoholic steatohepatitis (NASH): Secondary | ICD-10-CM | POA: Diagnosis not present

## 2023-10-03 DIAGNOSIS — E7800 Pure hypercholesterolemia, unspecified: Secondary | ICD-10-CM | POA: Diagnosis not present

## 2023-10-03 DIAGNOSIS — G2581 Restless legs syndrome: Secondary | ICD-10-CM

## 2023-10-03 DIAGNOSIS — K7469 Other cirrhosis of liver: Secondary | ICD-10-CM | POA: Diagnosis not present

## 2023-10-03 DIAGNOSIS — E1142 Type 2 diabetes mellitus with diabetic polyneuropathy: Secondary | ICD-10-CM | POA: Diagnosis not present

## 2023-10-03 DIAGNOSIS — D509 Iron deficiency anemia, unspecified: Secondary | ICD-10-CM

## 2023-10-03 MED ORDER — ATORVASTATIN CALCIUM 80 MG PO TABS: 80.0000 mg | ORAL_TABLET | Freq: Every day | ORAL | 2 refills | Status: DC

## 2023-10-03 NOTE — Progress Notes (Signed)
 Office Visit  Subjective   Patient ID: Kristopher [name]   DOB: [birth year]   Age: 73 y.o.   MRN: [id number]   Chief Complaint Chief Complaint  Patient presents with   Follow-up     History of Present Illness The patient is a 73 year old Caucasian/White male who returns for a follow-up visit for his T2 diabetes. This past year his HgBA1c was elevated and I wanted to start him on ozempic but he wanted to hold off and talk to his endocrinologist first.  They wanted him to remains on metformin and Actos.  His last visit with endocrinology was a telemedicine visit in 06/2023 and they did not change his medications.  He was diagnosed with T2 diabetes around 2000. Since the last visit, there have been no problems. He remains on metformin 1000mg  BID and actos 15mg  daily.  I tried him on farxiga 5mg  daily but he could not afford this.  He was exercising by walking 1-3 miles per day but began having fatigue.  He specifically denies unexplained abdominal pain, or documented hypoglycemia. He checks blood sugars about once per week and they tend to range somewhere between 100 and 140 mg/dl fasting. His last HgBA1c was done 3 months ago and was 6.7%.  He came in fasting today in anticipation of lab work. He does have complications of possible diabetic neuropathy involving his feet.  However he has had a laminectomy in the past which could contribute to this pain.  He remains on gabapentin 600mg  TID for tingling and mild pain which has not worsened. He also has complications of diabetic CAD. There is no diabetic retinopathy or nephropathy.   His last dilated eye exam was done on 04/18/2023 and this showed no evidence of diabetic retinopathy.    He does have a history of chronic nausea where he has been seen at [facility] clinic.  He has not started any new medications but remains on promethazine as needed which he uses about 3 times per week.  He also has a history of NASH cirrhosis and chronic gastritis where [facility] clinic  has been following him.  They believe his cirrhosis is due to fatty liver disease where they have done lab testing and imaging.  They did an EGD in 01/2016 which showed a normal esophagus but he had a few small non-bleeding erosions in the gastric antrum and mild portal hypertensive gastropathy in the gastric body.  He is on protonix daily.  They are doing surveillance with EGD and testing for liver cancer. He has a history of cirrhosis and has not seen GI at years where [facility] asked [place] to set him up with some in GI in [place].  We did set him up with Kristopher [name] at [facility] whom he saw on 07/11/2023.  His most recent labs showed a HgB of 11.8 on 07/2023.  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 He discussed his differential diagnosis includes gastroparesis, especially given diabetes history.   They performed a gastric emptying study on 07/24/2023 and this showed no scintigraphic evidence of delayed gastric emptying.  They discussed medications to help with his symptoms, but that they could come with undesired side effects.  He wanted him to keep a symptom diary to see if there may be dietary triggers for his vomiting episodes.   The patient was also noted to  have metabolic dysfunction associated steatohepatitis (MASH), compensated MASH, previously followed by [facility]. No recent follow-up or management. Discussed the need for regular monitoring to prevent complications. He has ordered a liver US and labs.  We will continue to monitor his blood counts and iron levels.  [name] returns today for routine followup on his cholesterol.  The patient has a history of both diabetes and CAD.  This past year, his LDL was not at goal and I asked him to start taking crestor 10mg  on Sat/Sun and 5mg  the rest of the week.  On his last visit in 06/2023, we wanted his LDL goal <55 but he was  noted to be 72 at that time.  I asked him to increase his crestor to 10mg  nightly.  He has noticed that he has had myalgias and fatigue now with the increased dose of crestor.  He has not been on atorvastatin in the past.   He specifically denies abdominal pain, diarrhea, or other problems.   He remains on dietary management as well as the following cholesterol lowering medications Co Q-10 150 mg oral capsule, niacin 500 mg oral tablet extended release, and rosuvastatin 10mg  nightly.  He last ate about 1 hour ago or so.     Past Medical History Past Medical History:  Diagnosis Date   Anemia 12/2020   Aortic atherosclerosis (HCC)    Atrial fibrillation (HCC)    a. s/p PVI X2 by Dr Macon Large at [facility] around 2000.   CAD (coronary artery disease)    a. NSTEMI (06/2013):  LHC (06/27/2013):  pLAD 10-20, RCA 90, EF 45-50%, inf HK.  PCI:  Xience Alpine (3 x 23 mm) DES to the RCA. b. Relook cath 07/10/13 - patent stent. Imdur added. c. Recurrent CP after that - abnormal nuc 07/2017 with chest pain, dyspnea, hypotensive response. Rest images with mid-basal inferior thinning. No stress images due to abrupt d/c of test. Relook cath 3/12 - no significant r   Chronic back pain greater than 3 months duration    Cirrhosis, non-alcoholic (HCC)    Depression    DJD (degenerative joint disease) of cervical spine    "3 herniated discs" (09/01/2013)   DJD (degenerative joint disease), lumbosacral    "bulging L5-S1" (09/01/2013)   Dysrhythmia    PVCs. A.fib with two ablations   Herniated cervical disc    c2-T1 fusion   History of kidney stones    turned into urosepsis   HLD (hyperlipidemia)    Ischemic cardiomyopathy    a. Echo (06/28/2011): Inferior and inferoseptal akinesis, EF 40%, grade 2 diastolic dysfunction, MAC, small effusion (no tamponade). B. EF 40% by echo 06/2013, cath 07/2013, 55% by cath 08/2013 with subtle mild mid inferior hypocontractility.   Liver cirrhosis secondary to NASH [facility])    Lumbar herniated  disc    Neuromuscular disorder (HCC)    bilateral neuropathy legs   NSTEMI (non-ST elevated myocardial infarction) (HCC) 06/27/2013   Osteoarthritis    "neck and lower back" (09/01/2013)   Pericardial effusion    a. Chronic since hx of pericarditis in 2004. b. Mod by echo 06/2013, small by echo 06/2013.   Restless leg syndrome    Type II diabetes mellitus (HCC)      Allergies Allergies  Allergen Reactions   Codeine Nausea And Vomiting   Duragesic-100 [Fentanyl] Nausea And Vomiting   Hydrocodone-Acetaminophen Nausea And Vomiting   Naprosyn [Naproxen] Nausea Only   Other Nausea And Vomiting    1. N/V to all  opiates. He is able to take versed. (noted 09/29/13) 2.The patient states that he has had  a reaction to all narcotics. He can take them though. (noted 06/26/13)     Medications  Current Outpatient Medications:    aspirin EC 81 MG tablet, Take 81 mg by mouth every evening. , Disp: , Rfl:    baclofen (LIORESAL) 10 MG tablet, TAKE 1 TABLET BY MOUTH THREE TIMES A DAY AS NEEDED FOR MUSCLE SPASMS, Disp: 270 tablet, Rfl: 1   diltiazem (CARDIZEM SR) 120 MG 12 hr capsule, Take 120 mg by mouth every 36 (thirty-six) hours., Disp: , Rfl:    escitalopram (LEXAPRO) 20 MG tablet, TAKE 1 TABLET BY MOUTH EVERY DAY, Disp: 90 tablet, Rfl: 1   gabapentin (NEURONTIN) 600 MG tablet, TAKE 1 TABLET BY MOUTH THREE TIMES A DAY, Disp: 180 tablet, Rfl: 5   metFORMIN (GLUCOPHAGE-XR) 500 MG 24 hr tablet, Take 1,000 mg by mouth 2 (two) times daily., Disp: , Rfl:    Misc Natural Products (PUMPKIN SEED OIL) CAPS, Take 1 capsule by mouth daily. 3000 mg, Disp: , Rfl:    niacin 500 MG tablet, Take 500 mg by mouth at bedtime., Disp: , Rfl:    pantoprazole (PROTONIX) 40 MG tablet, Take 1 tablet (40 mg total) by mouth daily., Disp: 90 tablet, Rfl: 1   promethazine (PHENERGAN) 25 MG tablet, Take 1 tablet (25 mg total) by mouth every 6 (six) hours as needed., Disp: 30 tablet, Rfl: 5   rOPINIRole (REQUIP) 1 MG tablet, TAKE 1  TABLET BY MOUTH 1 TO 3 HOURS BEFORE BEDTIME, Disp: 90 tablet, Rfl: 1   rOPINIRole (REQUIP) 3 MG tablet, TAKE 1 TABLET BY MOUTH AT BEDTIME., Disp: 90 tablet, Rfl: 3   Review of Systems Review of Systems  Constitutional:  Positive for malaise/fatigue. Negative for chills and fever.  Eyes:  Negative for blurred vision and double vision.  Respiratory:  Negative for cough and shortness of breath.   Cardiovascular:  Negative for chest pain, palpitations and leg swelling.  Gastrointestinal:  Positive for nausea. Negative for abdominal pain, blood in stool, constipation, diarrhea, melena and vomiting.  Genitourinary:  Negative for frequency and hematuria.  Musculoskeletal:  Positive for myalgias.  Skin:  Negative for itching and rash.  Neurological:  Negative for dizziness, weakness and headaches.  Endo/Heme/Allergies:  Negative for polydipsia.       Objective:    Vitals BP 114/68   Pulse 74   Temp 98.2 F (36.8 C)   Resp 17   Ht 5\' 7"  (1.702 m)   Wt 173 lb 3.2 oz (78.6 kg)   SpO2 97%   BMI 27.13 kg/m    Physical Examination Physical Exam Constitutional:      Appearance: Normal appearance. He is not ill-appearing.  Cardiovascular:     Rate and Rhythm: Normal rate and regular rhythm.     Pulses: Normal pulses.     Heart sounds: No murmur heard.    No friction rub. No gallop.  Pulmonary:     Effort: Pulmonary effort is normal. No respiratory distress.     Breath sounds: No wheezing, rhonchi or rales.  Abdominal:     General: Abdomen is flat. Bowel sounds are normal. There is no distension.     Palpations: Abdomen is soft.     Tenderness: There is no abdominal tenderness.  Musculoskeletal:     Right lower leg: No edema.     Left lower leg: No edema.  Skin:  General: Skin is warm and dry.     Findings: No rash.  Neurological:     General: No focal deficit present.     Mental Status: He is alert and oriented to person, place, and time.  Psychiatric:        Mood and  Affect: Mood normal.        Behavior: Behavior normal.        Assessment & Plan:   Cirrhosis (HCC) Plan as below.  Metabolic dysfunction-associated steatohepatitis (MASH) I have reviewed Dr. Julane Ny notes.  He has had some chronic nausea and vomiting and they did a liver US  and gastric emtyping.  He also ordered blood work.  He is to keep a meal diary as described.  Diabetic polyneuropathy associated with type 2 diabetes mellitus (HCC) We will check a HgBA1c.  The patient does not have diabetic gastroparesis from his studies.  His goal A1c <7%.  Hypercholesterolemia The patient is having fatigue and myaglias from his crestor increase.  I want him to stop this medication and give a washout for 2 weeks then start on lipitor 80mg  at bedtime.  If he has return of mylagias or fatigue, he is to call me.  We would probably have to start him on a PSK9 inhibitor.  Iron (Fe) deficiency anemia We will check his CBC and iron studies on his next visit.  His last HgB was 11.8 on 08/14/2023.  Restless legs syndrome We have increased his requip dose this past year but it has not effected his RLS.  His anemia could effect this but he is not that anemic.  I am going to refer him to neurology for their opinion.    Return in about 3 months (around 01/02/2024).   Wayne Haines, MD

## 2023-10-03 NOTE — Assessment & Plan Note (Signed)
 We will check a HgBA1c.  The patient does not have diabetic gastroparesis from his studies.  His goal A1c <7%.

## 2023-10-03 NOTE — Assessment & Plan Note (Signed)
 We have increased his requip dose this past year but it has not effected his RLS.  His anemia could effect this but he is not that anemic.  I am going to refer him to neurology for their opinion.

## 2023-10-03 NOTE — Assessment & Plan Note (Signed)
 Plan as below.

## 2023-10-03 NOTE — Assessment & Plan Note (Signed)
 I have reviewed Dr. Julane Ny notes.  He has had some chronic nausea and vomiting and they did a liver US  and gastric emtyping.  He also ordered blood work.  He is to keep a meal diary as described.

## 2023-10-03 NOTE — Assessment & Plan Note (Signed)
 We will check his CBC and iron studies on his next visit.  His last HgB was 11.8 on 08/14/2023.

## 2023-10-03 NOTE — Assessment & Plan Note (Signed)
 The patient is having fatigue and myaglias from his crestor increase.  I want him to stop this medication and give a washout for 2 weeks then start on lipitor 80mg  at bedtime.  If he has return of mylagias or fatigue, he is to call me.  We would probably have to start him on a PSK9 inhibitor.

## 2023-10-04 LAB — LIPID PANEL
Chol/HDL Ratio: 3.4 ratio (ref 0.0–5.0)
Cholesterol, Total: 138 mg/dL (ref 100–199)
HDL: 41 mg/dL (ref >39)
LDL Chol Calc (NIH): 71 mg/dL (ref 0–99)
Triglycerides: 149 mg/dL (ref 0–149)
VLDL Cholesterol Cal: 26 mg/dL (ref 5–40)

## 2023-10-04 LAB — CMP14 + ANION GAP
ALT: 15 IU/L (ref 0–44)
AST: 18 IU/L (ref 0–40)
Albumin: 4.4 g/dL (ref 3.9–4.9)
Alkaline Phosphatase: 76 IU/L (ref 44–121)
Anion Gap: 14 mmol/L (ref 10.0–18.0)
BUN/Creatinine Ratio: 20 (ref 10–24)
BUN: 18 mg/dL (ref 8–27)
Bilirubin Total: 0.6 mg/dL (ref 0.0–1.2)
CO2: 25 mmol/L (ref 20–29)
Calcium: 9.5 mg/dL (ref 8.6–10.2)
Chloride: 98 mmol/L (ref 96–106)
Creatinine, Ser: 0.91 mg/dL (ref 0.76–1.27)
Globulin, Total: 2.2 g/dL (ref 1.5–4.5)
Glucose: 145 mg/dL — ABNORMAL HIGH (ref 70–99)
Potassium: 4.5 mmol/L (ref 3.5–5.2)
Sodium: 137 mmol/L (ref 134–144)
Total Protein: 6.6 g/dL (ref 6.0–8.5)
eGFR: 91 mL/min/{1.73_m2} (ref >59)

## 2023-10-04 LAB — HEMOGLOBIN A1C
Est. average glucose Bld gHb Est-mCnc: 157 mg/dL
Hgb A1c MFr Bld: 7.1 % — ABNORMAL HIGH (ref 4.8–5.6)

## 2023-10-16 NOTE — Progress Notes (Signed)
 His diabetes is not controlled. Increase his actos  to 30mg  daily. His cholesterol is not at goal but we have started him on lipitor .   Called to advise patient with results, patient responded with " I'm not taking anymore medicine, Ya'll trying to kill me as it is" "I'm not doing it". And he hung up the phone.Aaron Aas

## 2023-10-23 ENCOUNTER — Other Ambulatory Visit: Payer: Self-pay | Admitting: Internal Medicine

## 2023-10-23 MED ORDER — BUSPIRONE HCL 5 MG PO TABS: 5.0000 mg | ORAL_TABLET | Freq: Two times a day (BID) | ORAL | 2 refills | Status: DC

## 2023-11-22 NOTE — Progress Notes (Signed)
 Initial neurology clinic note  Reason for Evaluation: Consultation requested by Wayne Haines, MD for an opinion regarding restless legs and cramps. My final recommendations will be communicated back to the requesting physician by way of shared medical record or letter to requesting physician via [place]  mail.  HPI: This is [name], a 72 y.o. right-handed male with a medical history of DM, CAD c/b cardiomyopathy, HLD, afib, degenerative disc disease (cervical and lumbar spine) s/p L4-5 surgery, NASH cirrhosis, chronic gastritis, iron  deficiency, OSA, OA, depression who presents to neurology clinic with the chief complaint of restless legs. The patient is accompanied by wife who is a respiratory therapist. Patient is a retired Buyer, retail.  Patients symptoms have been present for 5-10 years. He first noticed tingling, burning, and numbness in his legs from knees down. He takes gabapentin  600 mg in the morning and 1200 mg at night (9 pm) that seems to help. He typically goes to bed at 1-2 am. He is okay with current pain control.   He has imbalance and occasional falls. He has only had 1 fall in 2025, usually with feet getting tangled up.  When he lays down at night, he feels like bugs are crawling on legs and that they are restless. He is not sure that symptoms gets better when he gets up to move. Per wife though, he was walking 3 miles at night that would help symptoms (10 pm at night). He can no longer do this because of shortness of breath. This is being worked by up cardiology and may be related to afib per patient.  He has a lot of cramps in his legs that is all day and night. Standing up helps. He takes baclofen  10 mg twice daily. He is not sure if this helps.  While he is sleeping, he will flail around. Prior to CPAP, it would occur every 10-15 seconds. Patient does not know it is happening. He is not talking, screaming, punching, or fighting (no REM sleep disorder symptoms).  His sleep is okay. He is still snoring. He does not feel rested and is tired throughout the day.  Patient was put on requip  about 5 years ago. He is currently on 3 mg at night (9-10 pm). Wife thinks it helps with movements at night. Patient does not feel it helps with bug crawling, restless leg sensation.  Do RLS symptoms appear earlier than when the drug was first started? Yes Are higher doses of the drug now needed, or do you need to take the drug earlier in the day to control symptoms? Started at 1 mg and up titrated to 3 mg. Is not taking earlier Has the intensity of symptoms worsened since starting the drug? No Have symptoms spread to other body parts (eg, arms) since starting the drug? No  He was put on CPAP earlier this year. Sleep study saw a lot of limb movements in sleep. Wife thinks his movements may have improved further. Patient cannot tell any difference, but per wife, she thinks it was helping until he started having the afib and being short of breath.  Patient is also having a lot nausea and vomiting, some with meals. Per patient and his wife, gastroparesis has been ruled out. He has been seen by GI who cannot find anything per patient.  He is not on iron  supplementation. He is not on any other supplementation such as B12.  He does not report any constitutional symptoms like fever, night sweats, anorexia  or unintentional weight loss.  EtOH use: No  Restrictive diet? No Family history of neurologic disease? Paternal grandmother had ALS  Patient had an EMG many years ago, that showed neuropathy per patient.   MEDICATIONS:  Outpatient Encounter Medications as of 11/30/2023  Medication Sig   aspirin  EC 81 MG tablet Take 81 mg by mouth every evening.    atorvastatin  (LIPITOR ) 80 MG tablet TAKE 1 TABLET BY MOUTH EVERY DAY   baclofen  (LIORESAL ) 10 MG tablet TAKE 1 TABLET BY MOUTH THREE TIMES A DAY AS NEEDED FOR MUSCLE SPASM   busPIRone  (BUSPAR ) 5 MG tablet Take 1 tablet (5 mg  total) by mouth 2 (two) times daily.   diltiazem  (CARDIZEM  SR) 120 MG 12 hr capsule Take 120 mg by mouth every 36 (thirty-six) hours.   escitalopram  (LEXAPRO ) 20 MG tablet TAKE 1 TABLET BY MOUTH EVERY DAY   gabapentin  (NEURONTIN ) 600 MG tablet TAKE 1 TABLET BY MOUTH THREE TIMES A DAY   metFORMIN  (GLUCOPHAGE -XR) 500 MG 24 hr tablet Take 1,000 mg by mouth 2 (two) times daily.   Misc Natural Products (PUMPKIN SEED OIL) CAPS Take 1 capsule by mouth daily. 3000 mg   niacin  500 MG tablet Take 500 mg by mouth at bedtime.   pioglitazone  (ACTOS ) 15 MG tablet Take 15 mg by mouth daily.   promethazine  (PHENERGAN ) 25 MG tablet Take 1 tablet (25 mg total) by mouth every 6 (six) hours as needed.   [DISCONTINUED] rOPINIRole  (REQUIP ) 3 MG tablet TAKE 1 TABLET BY MOUTH AT BEDTIME.   pantoprazole  (PROTONIX ) 40 MG tablet Take 1 tablet (40 mg total) by mouth daily. (Patient not taking: Reported on 11/30/2023)   rOPINIRole  (REQUIP ) 1 MG tablet Take 2 tablets (2 mg total) by mouth at bedtime.   [DISCONTINUED] atorvastatin  (LIPITOR ) 80 MG tablet Take 1 tablet (80 mg total) by mouth daily.   [DISCONTINUED] baclofen  (LIORESAL ) 10 MG tablet TAKE 1 TABLET BY MOUTH THREE TIMES A DAY AS NEEDED FOR MUSCLE SPASMS   [DISCONTINUED] rOPINIRole  (REQUIP ) 1 MG tablet TAKE 1 TABLET BY MOUTH 1 TO 3 HOURS BEFORE BEDTIME (Patient not taking: Reported on 11/30/2023)   No facility-administered encounter medications on file as of 11/30/2023.    PAST MEDICAL HISTORY: Past Medical History:  Diagnosis Date   Anemia 12/2020   Aortic atherosclerosis (HCC)    Atrial fibrillation (HCC)    a. s/p PVI X2 by Dr Phillis Breaker at [facility] around 2000.   CAD (coronary artery disease)    a. NSTEMI (06/2013):  LHC (06/27/2013):  pLAD 10-20, RCA 90, EF 45-50%, inf HK.  PCI:  Xience Alpine (3 x 23 mm) DES to the RCA. b. Relook cath 07/10/13 - patent stent. Imdur  added. c. Recurrent CP after that - abnormal nuc 07/2017 with chest pain, dyspnea, hypotensive response.  Rest images with mid-basal inferior thinning. No stress images due to abrupt d/c of test. Relook cath 3/12 - no significant r   Chronic back pain greater than 3 months duration    Cirrhosis, non-alcoholic (HCC)    Depression    DJD (degenerative joint disease) of cervical spine    3 herniated discs (09/01/2013)   DJD (degenerative joint disease), lumbosacral    bulging L5-S1 (09/01/2013)   Dysrhythmia    PVCs. A.fib with two ablations   Herniated cervical disc    c2-T1 fusion   History of kidney stones    turned into urosepsis   HLD (hyperlipidemia)    Ischemic cardiomyopathy    a. Echo (06/28/2011):  Inferior and inferoseptal akinesis, EF 40%, grade 2 diastolic dysfunction, MAC, small effusion (no tamponade). B. EF 40% by echo 06/2013, cath 07/2013, 55% by cath 08/2013 with subtle mild mid inferior hypocontractility.   Liver cirrhosis secondary to NASH [facility])    Lumbar herniated disc    Neuromuscular disorder (HCC)    bilateral neuropathy legs   NSTEMI (non-ST elevated myocardial infarction) (HCC) 06/27/2013   Osteoarthritis    neck and lower back (09/01/2013)   Pericardial effusion    a. Chronic since hx of pericarditis in 2004. b. Mod by echo 06/2013, small by echo 06/2013.   Restless leg syndrome    Type II diabetes mellitus (HCC)     PAST SURGICAL HISTORY: Past Surgical History:  Procedure Laterality Date   APPENDECTOMY  ~ 1965   ATRIAL FIBRILLATION ABLATION  06/19/1998   Dr Phillis Breaker at [facility]   BIOPSY  06/15/2022   Procedure: BIOPSY;  Surgeon: Elois Hair, MD;  Location: [facility];  Service: Gastroenterology;;   CARDIAC CATHETERIZATION     COLONOSCOPY WITH PROPOFOL  N/A 06/15/2022   Procedure: COLONOSCOPY WITH PROPOFOL ;  Surgeon: Elois Hair, MD;  Location: [facility];  Service: Gastroenterology;  Laterality: N/A;   CORONARY ANGIOPLASTY     CYSTOSCOPY WITH RETROGRADE URETHROGRAM N/A 07/21/2021   Procedure: CYSTOSCOPY WITH URETHRAL DILATION;  Surgeon:  Andrez Banker, MD;  Location: [facility];  Service: Urology;  Laterality: N/A;  45 MINS   ESOPHAGOGASTRODUODENOSCOPY (EGD) WITH PROPOFOL  N/A 06/15/2022   Procedure: ESOPHAGOGASTRODUODENOSCOPY (EGD) WITH PROPOFOL ;  Surgeon: Elois Hair, MD;  Location: [facility];  Service: Gastroenterology;  Laterality: N/A;   LEFT AND RIGHT HEART CATHETERIZATION WITH CORONARY ANGIOGRAM N/A 10/07/2013   Procedure: LEFT AND RIGHT HEART CATHETERIZATION WITH CORONARY ANGIOGRAM;  Surgeon: Millicent Ally, MD;  Location: [facility] CATH LAB;  Service: Cardiovascular;  Laterality: N/A;   LEFT HEART CATHETERIZATION WITH CORONARY ANGIOGRAM N/A 06/27/2013   Procedure: LEFT HEART CATHETERIZATION WITH CORONARY ANGIOGRAM;  Surgeon: Millicent Ally, MD;  Location: [facility] CATH LAB;  Service: Cardiovascular;  Laterality: N/A;   LEFT HEART CATHETERIZATION WITH CORONARY ANGIOGRAM N/A 07/10/2013   Procedure: LEFT HEART CATHETERIZATION WITH CORONARY ANGIOGRAM;  Surgeon: Odie Benne, MD;  Location: [facility] CATH LAB;  Service: Cardiovascular;  Laterality: N/A;   LEFT HEART CATHETERIZATION WITH CORONARY ANGIOGRAM N/A 08/28/2013   Procedure: LEFT HEART CATHETERIZATION WITH CORONARY ANGIOGRAM;  Surgeon: Millicent Ally, MD;  Location: [facility] CATH LAB;  Service: Cardiovascular;  Laterality: N/A;   NEPHROLITHOTOMY Left 02/11/2021   Procedure: LEFT NEPHROLITHOTOMY PERCUTANEOUS WITH SURGEON ACCESS;  Surgeon: Andrez Banker, MD;  Location: [facility];  Service: Urology;  Laterality: Left;   PERCUTANEOUS CORONARY STENT INTERVENTION (PCI-S)  06/27/2013   Procedure: PERCUTANEOUS CORONARY STENT INTERVENTION (PCI-S);  Surgeon: Millicent Ally, MD;  Location: [facility] CATH LAB;  Service: Cardiovascular;;   POLYPECTOMY  06/15/2022   Procedure: POLYPECTOMY;  Surgeon: Elois Hair, MD;  Location: [facility];  Service: Gastroenterology;;   POSTERIOR CERVICAL FUSION/FORAMINOTOMY  06/20/2007   fusion c3-t1   PULMONARY VEIN ABLATION  06/20/1999   Dr  Phillis Breaker at [facility]   TONSILLECTOMY  06/19/1957   TRANSFORAMINAL LUMBAR INTERBODY FUSION W/ MIS 1 LEVEL Left 12/08/2021   Procedure: LEFT LUMBAR FIVE-SACRAL ONE MINIMALLY INVASIVE (MIS) TRANSFORAMINAL LUMBAR INTERBODY FUSION;  Surgeon: Cannon Champion, MD;  Location: [facility];  Service: Neurosurgery;  Laterality: Left;    ALLERGIES: Allergies  Allergen Reactions   Codeine Nausea And Vomiting   Duragesic -100 [Fentanyl ] Nausea  And Vomiting   Hydrocodone-Acetaminophen  Nausea And Vomiting   Naprosyn [Naproxen] Nausea Only   Other Nausea And Vomiting    1. N/V to all opiates. He is able to take versed . (noted 09/29/13) 2.The patient states that he has had  a reaction to all narcotics. He can take them though. (noted 06/26/13)    FAMILY HISTORY: Family History  Problem Relation Age of Onset   Ovarian cancer Mother    Arthritis Mother    CAD Father    Liver disease Father    Diabetes Father    Arthritis Father    COPD Father    Arthritis Sister    Dementia Sister    Stroke Maternal Grandmother    Sudden death Maternal Grandfather    Heart attack Maternal Grandfather    ALS Paternal Grandmother    Cancer Paternal Grandfather        unknown   Arrhythmia Neg Hx     SOCIAL HISTORY: Social History   Tobacco Use   Smoking status: Former    Current packs/day: 0.00    Average packs/day: 1.5 packs/day for 43.0 years (64.5 ttl pk-yrs)    Types: Cigarettes    Start date: 05/05/1964    Quit date: 05/06/2007    Years since quitting: 16.5   Smokeless tobacco: Never  Vaping Use   Vaping status: Never Used  Substance Use Topics   Alcohol use: Not Currently    Comment: once monthly/rarely   Drug use: No   Social History   Social History Narrative   Are you right handed or left handed? right   Are you currently employed ?    What is your current occupation? retired   Do you live at home alone?   Who lives with you? wife   What type of home do you live in: 1 story or 2 story? one     Caffiene all day long 6 soda     OBJECTIVE: PHYSICAL EXAM: BP 116/70   Pulse 92   Ht 5' 7 (1.702 m)   Wt 177 lb (80.3 kg)   SpO2 94%   BMI 27.72 kg/m   General: General appearance: Awake and alert. No distress. Cooperative with exam.  Skin: No obvious rash or jaundice. HEENT: Atraumatic. Anicteric. Lungs: Non-labored breathing on room air  Heart: Regular. Extremities: No edema.  Psych: Affect appropriate.  Neurological: Mental Status: Alert. Speech fluent. No pseudobulbar affect Cranial Nerves: CNII: No RAPD. Visual fields grossly intact. CNIII, IV, VI: PERRL. No nystagmus. EOMI. CN V: Facial sensation intact bilaterally to fine touch. CN VII: Facial muscles symmetric and strong. No ptosis at rest. CN VIII: Hearing grossly intact bilaterally. CN IX: No hypophonia. CN X: Palate elevates symmetrically. CN XI: Full strength shoulder shrug bilaterally. CN XII: Tongue protrusion full and midline. No atrophy or fasciculations. No significant dysarthria Motor: Tone is normal. Strength 5/5 in bilateral upper and lower extremities Reflexes:  Right Left   Bicep 2+ 2+   Tricep 2+ 2+   BrRad 2+ 2+   Knee 2+ 2+   Ankle 1+ 1+    Pathological Reflexes: Babinski: flexor response bilaterally Hoffman: absent bilaterally Troemner: absent bilaterally Sensation: Pinprick: Intact except in bilateral lateral aspects of lower legs (intact in feet) Vibration: Mildly diminished in bilateral great toes Proprioception: Intact in bilateral great toes Coordination: Intact finger-to- nose-finger bilaterally. Romberg with minimal sway Gait: Able to rise from chair with arms crossed unassisted. Normal, narrow-based gait.  Lab and Test Review: Internal labs:  10/03/23: Lipid panel: tChol 138, LDL 71, TG 149 HbA1c: 7.1 CMP significant for glucose 145  07/09/23: Ferritin: 171 TSH wnl  B12 (06/30/22): 273  Imaging/Procedures: MRI brain w/wo contrast (08/15/23): FINDINGS: Brain: No  acute infarction, hemorrhage, hydrocephalus, extra-axial collection or mass lesion. Small vessel ischemic gliosis in the cerebral white matter without progression from 2022, mild-to-moderate. Brain volume is preserved from prior scan and for patient age. No abnormal enhancement.   Vascular: Major flow voids and vascular enhancements are preserved   Skull and upper cervical spine: No marrow signal abnormality. Multilevel cervical fusion which is partially covered.   Sinuses/Orbits: No acute finding   Other: Intermittently significant motion degradation.   IMPRESSION: 1. No acute or reversible finding. 2. Chronic small vessel disease that is stable from 2022. 3. Motion artifact.  MRI lumbar spine wo contrast (03/19/23): FINDINGS: Segmentation: 5 lumbar type vertebral bodies as numbered previously on the CT. Prior MRI described S1 as a transitional segment. This exam describes L5 as a transitional segment.   Alignment:  Normal   Vertebrae:  Previous PLIF L4-5.   Conus medullaris and cauda equina: Conus extends to the L1 level. Conus and cauda equina appear normal.   Paraspinal and other soft tissues: Negative   Disc levels:   No significant finding from T11-12 through L2-3.   L3-4: Mild bulging of the disc. Bilateral facet and ligamentous hypertrophy. Mild narrowing of the lateral recesses and foramina but no visible neural compression.   L4-5: Previous posterior decompression, diskectomy and fusion. Pedicle screws and posterior rods. No complicating feature visible by MRI. Apparent sufficient patency of the canal and foramina.   L5-S1: Transitional features. No significant disc or facet abnormality.   IMPRESSION: 1. Previous posterior decompression, diskectomy and fusion at L4-5. Sufficient patency of the canal and foramina. 2. L3-4: Mild bulging of the disc. Facet and ligamentous hypertrophy. Mild narrowing of the lateral recesses and foramina but no visible  neural compression.  MRI cervical spine wo contrast (01/24/2008): IMPRESSION:  Congenitally small spinal canal upon which is superimposed  extensive degenerative disease.  The subarachnoid space is  essentially effaced in the region from C3-T1.  Cord deformity is  most notable at C5-6 there is also demonstrated at C3-4 and C6-7.  There is neural foraminal encroachment throughout the region as  outlined above. There is either migrated disc material or posterior  longitudinal ligament calcification behind the C7 vertebral body.   Sleep study (03/08/23):   ASSESSMENT: KISHAN WACHSMUTH is a 73 y.o. male who presents for evaluation of restless legs, periodic limb movement disorder, neuropathy, and leg cramps. 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 His neurological examination is pertinent for mild sensation loss in feet and lateral aspects of lower leg. Available diagnostic data is significant for sleep study in 2024 showing OSA and PLMD. B12 over 1 year ago was borderline low. Most recent HbA1c was 7.1. Most recent ferritin earlier this year was 171. Patient's symptoms are likely multifactorial. The loss of sensation in his leg may be a combination of residuals from spine disease and a distal symmetric polyneuropathy. He also has symptoms consistent with restless leg syndrome. Neuropathy is associated with RLS and could be contributing. He also has symptoms of periodic limb movement disorder of sleep. OSA can also be associated with both RLS and PLMD. Patient has been on  requip  for a long time, which is no longer recommended for RLS and may cause worsening (augmentation). I may try to slowly reduce this to see if this helps or at least prevents worsening. For PLMD, benzo can be considered, but this would not be wise currently with  SOB symptoms as I would not want to do anything that suppresses respiration.  PLAN: -Blood work: B1, B12, IFE -Reduce requip  to 2 mg nightly -Continue gabapentin  600 mg/1200 mg -OTC cramp recommendations given -Continue to work with cardiology (shortness of breath) and pulmonology (OSA)  -Return to clinic in 6 months  The impression above as well as the plan as outlined below were extensively discussed with the patient (in the company of wife) who voiced understanding. All questions were answered to their satisfaction.  The patient was counseled on pertinent fall precautions per the printed material provided today, and as noted under the Patient Instructions section below.  When available, results of the above investigations and possible further recommendations will be communicated to the patient via telephone/MyChart. Patient to call office if not contacted after expected testing turnaround time.   Total time spent reviewing records, interview, history/exam, documentation, and coordination of care on day of encounter:  75 min   Thank you for allowing me to participate in patient's care.  If I can answer any additional questions, I would be pleased to do so.  Rommie Coats, MD   CC: Wayne Haines, MD [address] [place] [address]  CC: Referring provider: Wayne Haines, MD [address] [place],  [place] [address]

## 2023-11-23 ENCOUNTER — Other Ambulatory Visit: Payer: Self-pay | Admitting: Internal Medicine

## 2023-11-28 DIAGNOSIS — I2510 Atherosclerotic heart disease of native coronary artery without angina pectoris: Secondary | ICD-10-CM | POA: Diagnosis not present

## 2023-11-28 DIAGNOSIS — R252 Cramp and spasm: Secondary | ICD-10-CM | POA: Diagnosis not present

## 2023-11-28 DIAGNOSIS — I480 Paroxysmal atrial fibrillation: Secondary | ICD-10-CM | POA: Diagnosis not present

## 2023-11-30 ENCOUNTER — Other Ambulatory Visit

## 2023-11-30 ENCOUNTER — Ambulatory Visit: Admitting: Neurology

## 2023-11-30 ENCOUNTER — Encounter: Payer: Self-pay | Admitting: Neurology

## 2023-11-30 VITALS — BP 116/70 | HR 92 | Ht 67.0 in | Wt 177.0 lb

## 2023-11-30 DIAGNOSIS — E1142 Type 2 diabetes mellitus with diabetic polyneuropathy: Secondary | ICD-10-CM

## 2023-11-30 DIAGNOSIS — R252 Cramp and spasm: Secondary | ICD-10-CM

## 2023-11-30 DIAGNOSIS — G4761 Periodic limb movement disorder: Secondary | ICD-10-CM

## 2023-11-30 DIAGNOSIS — G2581 Restless legs syndrome: Secondary | ICD-10-CM

## 2023-11-30 MED ORDER — ROPINIROLE HCL 1 MG PO TABS: 2.0000 mg | ORAL_TABLET | Freq: Every day | ORAL | 3 refills | Status: DC

## 2023-11-30 NOTE — Patient Instructions (Addendum)
 I saw you today for restless legs, periodic limb movement disorder, and neuropathy.  I would like to check some lab work today to look for contributing causes.  I would like to reduce your requip  as this can make restless legs worse in the long term. You will now take 2 mg at bedtime. We may try to slowly get you off this medication.  Continue gabapentin  600 mg in the morning and 1200 mg in evening.  Continue to work with cardiology (shortness of breath) and pulmonology ([facility]A).  - Recommend the following measures that may provide some symptomatic benefit for muscle twitching and/or cramps: - Adequate oral clear fluid intake to maintain optimal hydration (about 2.5 liters, or around 8-10 glasses per day) Avoidance of caffeine Trial of DIET tonic water: About 1 glass, up to 6 times daily Magnesium  oxide up to 400 mg by mouth twice daily, as needed (over the counter) Gentle muscle stretching routine, especially before bedtime  I will see you back in clinic in about 6 months or sooner if needed.  Please let me know if you have any questions or concerns in the meantime.  The physicians and staff at [facility] are committed to providing excellent care. You may receive a survey requesting feedback about your experience at our office. We strive to receive very good responses to the survey questions. If you feel that your experience would prevent you from giving the office a very good  response, please contact our office to try to remedy the situation. We may be reached at [phone]. Thank you for taking the time out of your busy day to complete the survey.  Rommie Coats, MD [facility]  Preventing Falls at [facility] are common, often dreaded events in the lives of older people. Aside from the obvious injuries and even death that may result, fall can cause wide-ranging consequences including loss of independence, mental decline, decreased activity and mobility. Younger people  are also at risk of falling, especially those with chronic illnesses and fatigue.  Ways to reduce risk for falling Examine diet and medications. Warm foods and alcohol dilate blood vessels, which can lead to dizziness when standing. Sleep aids, antidepressants and pain medications can also increase the likelihood of a fall.  Get a vision exam. Poor vision, cataracts and glaucoma increase the chances of falling.  Check foot gear. Shoes should fit snugly and have a sturdy, nonskid sole and a broad, low heel  Participate in a physician-approved exercise program to build and maintain muscle strength and improve balance and coordination. Programs that use ankle weights or stretch bands are excellent for muscle-strengthening. Water aerobics programs and low-impact Tai Chi programs have also been shown to improve balance and coordination.  Increase vitamin D intake. Vitamin D improves muscle strength and increases the amount of calcium  the body is able to absorb and deposit in bones.  How to prevent falls from common hazards Floors - Remove all loose wires, cords, and throw rugs. Minimize clutter. Make sure rugs are anchored and smooth. Keep furniture in its usual place.  Chairs -- Use chairs with straight backs, armrests and firm seats. Add firm cushions to existing pieces to add height.  Bathroom - Install grab bars and non-skid tape in the tub or shower. Use a bathtub transfer bench or a shower chair with a back support Use an elevated toilet seat and/or safety rails to assist standing from a low surface. Do not use towel racks or bathroom tissue holders to  help you stand.  Lighting - Make sure halls, stairways, and entrances are well-lit. Install a night light in your bathroom or hallway. Make sure there is a light switch at the top and bottom of the staircase. Turn lights on if you get up in the middle of the night. Make sure lamps or light switches are within reach of the bed if you have to get up  during the night.  Kitchen - Install non-skid rubber mats near the sink and stove. Clean spills immediately. Store frequently used utensils, pots, pans between waist and eye level. This helps prevent reaching and bending. Sit when getting things out of lower cupboards.  Living room/ Bedrooms - Place furniture with wide spaces in between, giving enough room to move around. Establish a route through the living room that gives you something to hold onto as you walk.  Stairs - Make sure treads, rails, and rugs are secure. Install a rail on both sides of the stairs. If stairs are a threat, it might be helpful to arrange most of your activities on the lower level to reduce the number of times you must climb the stairs.  Entrances and doorways - Install metal handles on the walls adjacent to the doorknobs of all doors to make it more secure as you travel through the doorway.  Tips for maintaining balance Keep at least one hand free at all times. Try using a backpack or fanny pack to hold things rather than carrying them in your hands. Never carry objects in both hands when walking as this interferes with keeping your balance.  Attempt to swing both arms from front to back while walking. This might require a conscious effort if Parkinson's disease has diminished your movement. It will, however, help you to maintain balance and posture, and reduce fatigue.  Consciously lift your feet off of the ground when walking. Shuffling and dragging of the feet is a common culprit in losing your balance.  When trying to navigate turns, use a U technique of facing forward and making a wide turn, rather than pivoting sharply.  Try to stand with your feet shoulder-length apart. When your feet are close together for any length of time, you increase your risk of losing your balance and falling.  Do one thing at a time. Don't try to walk and accomplish another task, such as reading or looking around. The decrease in  your automatic reflexes complicates motor function, so the less distraction, the better.  Do not wear rubber or gripping soled shoes, they might catch on the floor and cause tripping.  Move slowly when changing positions. Use deliberate, concentrated movements and, if needed, use a grab bar or walking aid. Count 15 seconds between each movement. For example, when rising from a seated position, wait 15 seconds after standing to begin walking.  If balance is a continuous problem, you might want to consider a walking aid such as a cane, walking stick, or walker. Once you've mastered walking with help, you might be ready to try it on your own again.

## 2023-12-04 ENCOUNTER — Encounter: Payer: Self-pay | Admitting: Internal Medicine

## 2023-12-04 ENCOUNTER — Ambulatory Visit: Admitting: Internal Medicine

## 2023-12-04 VITALS — BP 112/70 | HR 98 | Temp 97.6°F | Resp 16 | Ht 67.0 in | Wt 179.6 lb

## 2023-12-04 DIAGNOSIS — G4733 Obstructive sleep apnea (adult) (pediatric): Secondary | ICD-10-CM | POA: Diagnosis not present

## 2023-12-04 DIAGNOSIS — R053 Chronic cough: Secondary | ICD-10-CM | POA: Diagnosis not present

## 2023-12-04 DIAGNOSIS — G2581 Restless legs syndrome: Secondary | ICD-10-CM | POA: Diagnosis not present

## 2023-12-04 DIAGNOSIS — R0602 Shortness of breath: Secondary | ICD-10-CM | POA: Diagnosis not present

## 2023-12-04 LAB — IMMUNOFIXATION ELECTROPHORESIS
IgG (Immunoglobin G), Serum: 924 mg/dL (ref 600–1540)
IgM, Serum: 67 mg/dL (ref 50–300)
Immunoglobulin A: 120 mg/dL (ref 70–320)

## 2023-12-04 LAB — VITAMIN B12: Vitamin B-12: 263 pg/mL (ref 200–1100)

## 2023-12-04 LAB — VITAMIN B1: Vitamin B1 (Thiamine): 13 nmol/L (ref 8–30)

## 2023-12-04 NOTE — Progress Notes (Signed)
 [facility] [address] [place], [place] [address]  Pulmonary Sleep Medicine   Office Visit Note  Patient Name: Kristopher [name] DOB: 1950/11/09 MRN [id number]  Date of Service: 12/04/2023  Complaints/HPI: He states that his A fib has been acting up again. He has also been having desaturations noted. He states down to 82%. He also has been feling shortness of breath. He notes no orthopnea. He feels better if he lays down. No chest pain noted. Notes there are palpitation. Today he feels better but still gets shortness of breath. He has not had an overnight ox done. States he is using his PAP at home.  He does state that he has an echocardiogram study scheduled with his cardiologist at [facility].  Office Spirometry Results:     ROS  General: (-) fever, (-) chills, (-) night sweats, (-) weakness Skin: (-) rashes, (-) itching,. Eyes: (-) visual changes, (-) redness, (-) itching. Nose and Sinuses: (-) nasal stuffiness or itchiness, (-) postnasal drip, (-) nosebleeds, (-) sinus trouble. Mouth and Throat: (-) sore throat, (-) hoarseness. Neck: (-) swollen glands, (-) enlarged thyroid , (-) neck pain. Respiratory: - cough, (-) bloody sputum, + shortness of breath, - wheezing. Cardiovascular: - ankle swelling, (-) chest pain. Lymphatic: (-) lymph node enlargement. Neurologic: (-) numbness, (-) tingling. Psychiatric: (-) anxiety, (-) depression   Current Medication: Outpatient Encounter Medications as of 12/04/2023  Medication Sig   aspirin  EC 81 MG tablet Take 81 mg by mouth every evening.    atorvastatin  (LIPITOR ) 80 MG tablet TAKE 1 TABLET BY MOUTH EVERY DAY   baclofen  (LIORESAL ) 10 MG tablet TAKE 1 TABLET BY MOUTH THREE TIMES A DAY AS NEEDED FOR MUSCLE SPASM   busPIRone  (BUSPAR ) 5 MG tablet Take 1 tablet (5 mg total) by mouth 2 (two) times daily.   diltiazem  (CARDIZEM  SR) 120 MG 12 hr capsule Take 120 mg by mouth every 36 (thirty-six) hours.   escitalopram  (LEXAPRO )  20 MG tablet TAKE 1 TABLET BY MOUTH EVERY DAY   gabapentin  (NEURONTIN ) 600 MG tablet TAKE 1 TABLET BY MOUTH THREE TIMES A DAY   metFORMIN  (GLUCOPHAGE -XR) 500 MG 24 hr tablet Take 1,000 mg by mouth 2 (two) times daily.   Misc Natural Products (PUMPKIN SEED OIL) CAPS Take 1 capsule by mouth daily. 3000 mg   niacin  500 MG tablet Take 500 mg by mouth at bedtime.   pantoprazole  (PROTONIX ) 40 MG tablet Take 1 tablet (40 mg total) by mouth daily.   pioglitazone  (ACTOS ) 15 MG tablet Take 15 mg by mouth daily.   promethazine  (PHENERGAN ) 25 MG tablet Take 1 tablet (25 mg total) by mouth every 6 (six) hours as needed.   rOPINIRole  (REQUIP ) 1 MG tablet Take 2 tablets (2 mg total) by mouth at bedtime.   No facility-administered encounter medications on file as of 12/04/2023.    Surgical History: Past Surgical History:  Procedure Laterality Date   APPENDECTOMY  ~ 1965   ATRIAL FIBRILLATION ABLATION  06/19/1998   Dr Britta at [facility]   BIOPSY  06/15/2022   Procedure: BIOPSY;  Surgeon: Stacia Glendia BRAVO, MD;  Location: [facility];  Service: Gastroenterology;;   CARDIAC CATHETERIZATION     COLONOSCOPY WITH PROPOFOL  N/A 06/15/2022   Procedure: COLONOSCOPY WITH PROPOFOL ;  Surgeon: Stacia Glendia BRAVO, MD;  Location: [facility];  Service: Gastroenterology;  Laterality: N/A;   CORONARY ANGIOPLASTY     CYSTOSCOPY WITH RETROGRADE URETHROGRAM N/A 07/21/2021   Procedure: CYSTOSCOPY WITH URETHRAL DILATION;  Surgeon: Cam,  Morene ORN, MD;  Location: [facility];  Service: Urology;  Laterality: N/A;  45 MINS   ESOPHAGOGASTRODUODENOSCOPY (EGD) WITH PROPOFOL  N/A 06/15/2022   Procedure: ESOPHAGOGASTRODUODENOSCOPY (EGD) WITH PROPOFOL ;  Surgeon: Stacia Glendia BRAVO, MD;  Location: [facility];  Service: Gastroenterology;  Laterality: N/A;   LEFT AND RIGHT HEART CATHETERIZATION WITH CORONARY ANGIOGRAM N/A 10/07/2013   Procedure: LEFT AND RIGHT HEART CATHETERIZATION WITH CORONARY ANGIOGRAM;  Surgeon: Debby DELENA Sor,  MD;  Location: [facility] CATH LAB;  Service: Cardiovascular;  Laterality: N/A;   LEFT HEART CATHETERIZATION WITH CORONARY ANGIOGRAM N/A 06/27/2013   Procedure: LEFT HEART CATHETERIZATION WITH CORONARY ANGIOGRAM;  Surgeon: Debby DELENA Sor, MD;  Location: [facility] CATH LAB;  Service: Cardiovascular;  Laterality: N/A;   LEFT HEART CATHETERIZATION WITH CORONARY ANGIOGRAM N/A 07/10/2013   Procedure: LEFT HEART CATHETERIZATION WITH CORONARY ANGIOGRAM;  Surgeon: Lonni JONETTA Cash, MD;  Location: [facility] CATH LAB;  Service: Cardiovascular;  Laterality: N/A;   LEFT HEART CATHETERIZATION WITH CORONARY ANGIOGRAM N/A 08/28/2013   Procedure: LEFT HEART CATHETERIZATION WITH CORONARY ANGIOGRAM;  Surgeon: Debby DELENA Sor, MD;  Location: [facility] CATH LAB;  Service: Cardiovascular;  Laterality: N/A;   NEPHROLITHOTOMY Left 02/11/2021   Procedure: LEFT NEPHROLITHOTOMY PERCUTANEOUS WITH SURGEON ACCESS;  Surgeon: Cam Morene ORN, MD;  Location: [facility];  Service: Urology;  Laterality: Left;   PERCUTANEOUS CORONARY STENT INTERVENTION (PCI-S)  06/27/2013   Procedure: PERCUTANEOUS CORONARY STENT INTERVENTION (PCI-S);  Surgeon: Debby DELENA Sor, MD;  Location: [facility] CATH LAB;  Service: Cardiovascular;;   POLYPECTOMY  06/15/2022   Procedure: POLYPECTOMY;  Surgeon: Stacia Glendia BRAVO, MD;  Location: [facility];  Service: Gastroenterology;;   POSTERIOR CERVICAL FUSION/FORAMINOTOMY  06/20/2007   fusion c3-t1   PULMONARY VEIN ABLATION  06/20/1999   Dr Britta at [facility]   TONSILLECTOMY  06/19/1957   TRANSFORAMINAL LUMBAR INTERBODY FUSION W/ MIS 1 LEVEL Left 12/08/2021   Procedure: LEFT LUMBAR FIVE-SACRAL ONE MINIMALLY INVASIVE (MIS) TRANSFORAMINAL LUMBAR INTERBODY FUSION;  Surgeon: Cheryle Debby DELENA, MD;  Location: [facility];  Service: Neurosurgery;  Laterality: Left;    Medical History: Past Medical History:  Diagnosis Date   Anemia 12/2020   Aortic atherosclerosis (HCC)    Atrial fibrillation (HCC)    a. s/p PVI X2 by Dr Britta at [facility]  around 2000.   CAD (coronary artery disease)    a. NSTEMI (06/2013):  LHC (06/27/2013):  pLAD 10-20, RCA 90, EF 45-50%, inf HK.  PCI:  Xience Alpine (3 x 23 mm) DES to the RCA. b. Relook cath 07/10/13 - patent stent. Imdur  added. c. Recurrent CP after that - abnormal nuc 07/2017 with chest pain, dyspnea, hypotensive response. Rest images with mid-basal inferior thinning. No stress images due to abrupt d/c of test. Relook cath 3/12 - no significant r   Chronic back pain greater than 3 months duration    Cirrhosis, non-alcoholic (HCC)    Depression    DJD (degenerative joint disease) of cervical spine    3 herniated discs (09/01/2013)   DJD (degenerative joint disease), lumbosacral    bulging L5-S1 (09/01/2013)   Dysrhythmia    PVCs. A.fib with two ablations   Herniated cervical disc    c2-T1 fusion   History of kidney stones    turned into urosepsis   HLD (hyperlipidemia)    Ischemic cardiomyopathy    a. Echo (06/28/2011): Inferior and inferoseptal akinesis, EF 40%, grade 2 diastolic dysfunction, MAC, small effusion (no tamponade). B. EF 40% by echo 06/2013, cath 07/2013, 55% by cath 08/2013 with  subtle mild mid inferior hypocontractility.   Liver cirrhosis secondary to [facility])    Lumbar herniated disc    Neuromuscular disorder (HCC)    bilateral neuropathy legs   NSTEMI (non-ST elevated myocardial infarction) (HCC) 06/27/2013   Osteoarthritis    neck and lower back (09/01/2013)   Pericardial effusion    a. Chronic since hx of pericarditis in 2004. b. Mod by echo 06/2013, small by echo 06/2013.   Restless leg syndrome    Type II diabetes mellitus (HCC)     Family History: Family History  Problem Relation Age of Onset   Ovarian cancer Mother    Arthritis Mother    CAD Father    Liver disease Father    Diabetes Father    Arthritis Father    COPD Father    Arthritis Sister    Dementia Sister    Stroke Maternal Grandmother    Sudden death Maternal Grandfather    Heart attack  Maternal Grandfather    ALS Paternal Grandmother    Cancer Paternal Grandfather        unknown   Arrhythmia Neg Hx     Social History: Social History   Socioeconomic History   Marital status: Married    Spouse name: Not on file   Number of children: 0   Years of education: Not on file   Highest education level: Not on file  Occupational History   Occupation: RESP/retired    Employer: [employer]    Comment: Theatre manager at [employer]  Tobacco Use   Smoking status: Former    Current packs/day: 0.00    Average packs/day: 1.5 packs/day for 43.0 years (64.5 ttl pk-yrs)    Types: Cigarettes    Start date: 05/05/1964    Quit date: 05/06/2007    Years since quitting: 16.5   Smokeless tobacco: Never  Vaping Use   Vaping status: Never Used  Substance and Sexual Activity   Alcohol use: Not Currently    Comment: once monthly/rarely   Drug use: No   Sexual activity: Not Currently  Other Topics Concern   Not on file  Social History Narrative   Are you right handed or left handed? right   Are you currently employed ?    What is your current occupation? retired   Do you live at home alone?   Who lives with you? wife   What type of home do you live in: 1 story or 2 story? one    Caffiene all day long 6 soda   Social Drivers of Health   Financial Resource Strain: Low Risk  (08/22/2020)   Received from [facility]   Overall Financial Resource Strain (CARDIA)  Food Insecurity: No Food Insecurity (08/22/2020)   Received from [facility]   Hunger Vital Sign  Transportation Needs: No Transportation Needs (08/22/2020)   Received from [facility]   PRAPARE - Transportation  Physical Activity: Sufficiently Active (08/22/2020)   Received from [facility]   Exercise Vital Sign  Stress: Stress Concern Present (08/22/2020)   Received from [facility]  [facility]upational Health - Occupational Stress Questionnaire  Social Connections: Not on file  Intimate Partner Violence: Not on file    Vital Signs: Blood pressure 112/70, pulse 98, temperature 97.6 F (36.4 C), resp. rate 16, height 5' 7 (1.702 m), weight 179 lb 9.6 oz (81.5 kg), SpO2 94%.  Examination: General  Appearance: The patient is well-developed, well-nourished, and in no distress. Skin: Gross inspection of skin unremarkable. Head: normocephalic, no gross deformities. Eyes: no gross deformities noted. ENT: ears appear grossly normal no exudates. Neck: Supple. No thyromegaly. No LAD. Respiratory: no rhonchi noted. Cardiovascular: Normal S1 and S2 without murmur or rub. Extremities: No cyanosis. pulses are equal. Neurologic: Alert and oriented. No involuntary movements.  LABS: Recent Results (from the past 2160 hours)  CMP14 + Anion Gap     Status: Abnormal   Collection Time: 10/03/23  2:28 PM  Result Value Ref Range   Glucose 145 (H) 70 - 99 mg/dL   BUN 18 8 - 27 mg/dL   Creatinine, Ser 9.08 0.76 - 1.27 mg/dL   eGFR 91 >40 fO/fpw/8.26   BUN/Creatinine Ratio 20 10 - 24   Sodium 137 134 - 144 mmol/L   Potassium 4.5 3.5 - 5.2 mmol/L   Chloride 98 96 - 106 mmol/L   CO2 25 20 - 29 mmol/L   Anion Gap 14.0 10.0 - 18.0 mmol/L   Calcium  9.5 8.6 - 10.2 mg/dL   Total Protein 6.6 6.0 - 8.5 g/dL   Albumin 4.4 3.9 - 4.9 g/dL   Globulin, Total 2.2 1.5 - 4.5 g/dL   Bilirubin Total 0.6 0.0 - 1.2 mg/dL   Alkaline Phosphatase 76 44 - 121 IU/L   AST 18 0 - 40 IU/L   ALT 15 0 - 44 IU/L  Hemoglobin A1c     Status: Abnormal   Collection Time: 10/03/23  2:28 PM  Result Value Ref Range   Hgb A1c MFr Bld 7.1 (H) 4.8 - 5.6 %    Comment:          Prediabetes: 5.7 - 6.4          Diabetes: >6.4          Glycemic control for adults with diabetes: <7.0    Est. average glucose Bld gHb Est-mCnc 157 mg/dL  Lipid panel     Status: None   Collection Time: 10/03/23  2:28 PM  Result Value Ref  Range   Cholesterol, Total 138 100 - 199 mg/dL   Triglycerides 850 0 - 149 mg/dL   HDL 41 >60 mg/dL   VLDL Cholesterol Cal 26 5 - 40 mg/dL   LDL Chol Calc (NIH) 71 0 - 99 mg/dL   Chol/HDL Ratio 3.4 0.0 - 5.0 ratio    Comment:                                   T. Chol/HDL Ratio                                             Men  Women                               1/2 Avg.Risk  3.4    3.3                                   Avg.Risk  5.0    4.4  2X Avg.Risk  9.6    7.1                                3X Avg.Risk 23.4   11.0   Vitamin B1     Status: None   Collection Time: 11/30/23  9:24 AM  Result Value Ref Range   Vitamin B1 (Thiamine ) 13 8 - 30 nmol/L    Comment: (Note) Vitamin supplementation within 24 hours prior to blood  draw may affect the accuracy of the results. . This test was developed and its analytical performance  characteristics have been determined by [employer]. It has not been cleared or approved by FDA.  This assay has been validated pursuant to the CLIA  regulations and is used for clinical purposes. . [facility] [address] [place] [address] [phone] Johanna Agent L. Gino, MD, PhD   Vitamin B12     Status: None   Collection Time: 11/30/23  9:24 AM  Result Value Ref Range   Vitamin B-12 263 200 - 1,100 pg/mL    Comment: . Please Note: Although the reference range for vitamin B12 is [phone] pg/mL, it has been reported that between 5 and 10% of patients with values between 200 and 400 pg/mL may experience neuropsychiatric and hematologic abnormalities due to occult B12 deficiency; less than 1% of patients with values above 400 pg/mL will have symptoms. .   Immunofixation electrophoresis     Status: None (In process)   Collection Time: 11/30/23  9:24 AM  Result Value Ref Range   Immunoglobulin A 120 70 - 320 mg/dL   IgG (Immunoglobin G), Serum 924 600 - 1,540 mg/dL   IgM, Serum  67 50 - 699 mg/dL    Radiology: MR Brain W and Wo Contrast Result Date: 08/15/2023 CLINICAL DATA:  Neuro deficit with acute stroke suspected. Hypertension EXAM: MRI HEAD WITHOUT AND WITH CONTRAST TECHNIQUE: Multiplanar, multiecho pulse sequences of the brain and surrounding structures were obtained without and with intravenous contrast. CONTRAST:  7.5mL GADAVIST  GADOBUTROL  1 MMOL/ML IV SOLN COMPARISON:  02/26/2021 FINDINGS: Brain: No acute infarction, hemorrhage, hydrocephalus, extra-axial collection or mass lesion. Small vessel ischemic gliosis in the cerebral white matter without progression from 2022, mild-to-moderate. Brain volume is preserved from prior scan and for patient age. No abnormal enhancement. Vascular: Major flow voids and vascular enhancements are preserved Skull and upper cervical spine: No marrow signal abnormality. Multilevel cervical fusion which is partially covered. Sinuses/Orbits: No acute finding Other: Intermittently significant motion degradation. IMPRESSION: 1. No acute or reversible finding. 2. Chronic small vessel disease that is stable from 2022. 3. Motion artifact. Electronically Signed   By: Dorn Roulette M.D.   On: 08/15/2023 05:06   CT Head Wo Contrast Result Date: 08/14/2023 CLINICAL DATA:  Altered level of consciousness, last known well 6 a.m., hypertension EXAM: CT HEAD WITHOUT CONTRAST TECHNIQUE: Contiguous axial images were obtained from the base of the skull through the vertex without intravenous contrast. RADIATION DOSE REDUCTION: This exam was performed according to the departmental dose-optimization program which includes automated exposure control, adjustment of the mA and/or kV according to patient size and/or use of iterative reconstruction technique. COMPARISON:  02/26/2021 FINDINGS: Brain: No acute infarct or hemorrhage. Stable chronic small-vessel ischemic changes in the periventricular white matter. The lateral ventricles and midline structures are  otherwise unremarkable. No acute extra-axial fluid collections. No mass effect. Vascular: Stable atherosclerosis.  No hyperdense vessel. Skull: Normal. Negative for fracture or focal lesion. Sinuses/Orbits: No acute finding. Other: None. IMPRESSION: 1. No acute intracranial process. Electronically Signed   By: Ozell Daring M.D.   On: 08/14/2023 22:29   DG Chest Portable 1 View Result Date: 08/14/2023 CLINICAL DATA:  Altered mental status EXAM: PORTABLE CHEST 1 VIEW COMPARISON:  07/17/2022 FINDINGS: Heart and mediastinal contours are within normal limits. No focal opacities or effusions. No acute bony abnormality. Loop recorder device again noted. IMPRESSION: No active disease. Electronically Signed   By: Franky Crease M.D.   On: 08/14/2023 22:12    No results found.  No results found.  Assessment and Plan: Patient Active Problem List   Diagnosis Date Noted   Metabolic dysfunction-associated steatohepatitis (MASH) 10/03/2023   Altered mental status 08/20/2023   Benign prostatic hyperplasia without lower urinary tract symptoms 07/09/2023   BMI 27.0-27.9,adult 07/09/2023   Type 2 diabetes mellitus with other circulatory complications (HCC) 01/03/2023   OSA (obstructive sleep apnea) 11/22/2022   Wheezing 07/17/2022   Atrial fibrillation (HCC) 07/17/2022   Diabetic polyneuropathy associated with type 2 diabetes mellitus (HCC) 06/30/2022   Aortic atherosclerosis (HCC) 06/30/2022   Herniated disc, cervical 06/30/2022   Lumbar herniated disc 06/30/2022   Vitamin B12 deficiency 06/30/2022   BMI 25.0-25.9,adult 06/30/2022   Iron  (Fe) deficiency anemia 03/13/2022   Iron  deficiency anemia, unspecified 02/23/2022   Nausea and vomiting 12/13/2021   Constipation 12/13/2021   Spondylolisthesis of lumbar region 12/08/2021   Hypokalemia    Hypomagnesemia    Hypercholesterolemia    Acute cephalic vein thrombosis, right    Dyspnea    Atrial fibrillation with RVR (HCC)    Anemia 02/23/2021    Nephrolithiasis 02/11/2021   Syncope 09/18/2019   Encounter for general adult medical examination with abnormal findings 04/30/2018   Muscle spasm 04/30/2018   Dysuria 04/30/2018   Cirrhosis (HCC) 12/17/2015   Pericardial effusion 07/22/2014   Restless legs syndrome 06/26/2014   Other amnesia 06/04/2014   Coronary artery disease involving native coronary artery of native heart without angina pectoris 04/13/2014   Cardiomyopathy, ischemic 04/13/2014   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   Obstructive sleep apnea syndrome 03/12/2014   Acute pericarditis, unspecified - possible 09/04/2013   Exertional chest pain 09/04/2013   Chest pain with moderate risk for cardiac etiology - etiology likely pericarditis 09/01/2013   Abnormal exercise tolerance test 08/26/2013   Depression 08/07/2013   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   Atrial arrhythmia    Non-ST elevation myocardial infarction (NSTEMI), subendocardial infarction, subsequent episode of care (HCC) 06/27/2013    Class: History of    1. SOB (shortness of breath) (Primary) Patient has increasing symptoms of shortness of breath.  His wife notes that he has been having desaturations rather nasally.  Today we did the 6-minute walk and it did not show significant desaturation but he could still be having nocturnal desaturations.  In addition he does have an echocardiogram that is already scheduled. - 6 minute walk - Spirometry with graph; Future - Pulmonary function test; Future - CT Chest High Resolution; Future  2. OSA (obstructive sleep apnea) Would recommend getting an overnight oximetry due to shortness of breath may be having nocturnal desaturations  3. RLS (restless legs syndrome) Continue with present management his symptoms are better  4. Chronic cough Will get an upper GI to evaluate - DG UGI W DOUBLE CM (HD BA); Future   General Counseling: I have discussed the findings of the evaluation  and  examination with Lynwood.  I have also discussed any further diagnostic evaluation thatmay be needed or ordered today. Jonathin verbalizes understanding of the findings of todays visit. We also reviewed his medications today and discussed drug interactions and side effects including but not limited excessive drowsiness and altered mental states. We also discussed that there is always a risk not just to him but also people around him. he has been encouraged to call the office with any questions or concerns that should arise related to todays visit.  Orders Placed This Encounter  Procedures   CT Chest High Resolution    Standing Status:   Future    Expiration Date:   12/03/2024    Preferred imaging location?:   [facility]   DG UGI W DOUBLE CM (HD BA)    Standing Status:   Future    Expiration Date:   12/03/2024    Reason for Exam (SYMPTOM  OR DIAGNOSIS REQUIRED):   GERD    Preferred imaging location?:   [facility]   Pulse oximetry, overnight    Standing Status:   Future    Expiration Date:   12/04/2024   6 minute walk   Spirometry with graph    Standing Status:   Future    Expiration Date:   12/03/2024    Where should this test be performed?:   [facility]   Pulmonary function test    Standing Status:   Future    Expiration Date:   12/03/2024    Where should this test be performed?:   [facility]    What type of PFT is being ordered?:   Full PFT     Time spent: 72  I have personally obtained a history, examined the patient, evaluated laboratory and imaging results, formulated the assessment and plan and placed orders.    Elfreda DELENA Bathe, MD [facility] Pulmonary and Critical Care Sleep medicine

## 2023-12-04 NOTE — Patient Instructions (Signed)

## 2023-12-05 ENCOUNTER — Telehealth: Payer: Self-pay | Admitting: Internal Medicine

## 2023-12-05 ENCOUNTER — Ambulatory Visit: Payer: Self-pay | Admitting: Neurology

## 2023-12-05 DIAGNOSIS — Z87891 Personal history of nicotine dependence: Secondary | ICD-10-CM | POA: Diagnosis not present

## 2023-12-05 DIAGNOSIS — I255 Ischemic cardiomyopathy: Secondary | ICD-10-CM | POA: Diagnosis not present

## 2023-12-05 DIAGNOSIS — Z7901 Long term (current) use of anticoagulants: Secondary | ICD-10-CM | POA: Diagnosis not present

## 2023-12-05 DIAGNOSIS — I700 Atherosclerosis of aorta: Secondary | ICD-10-CM | POA: Diagnosis not present

## 2023-12-05 DIAGNOSIS — E1151 Type 2 diabetes mellitus with diabetic peripheral angiopathy without gangrene: Secondary | ICD-10-CM | POA: Diagnosis not present

## 2023-12-05 DIAGNOSIS — R0602 Shortness of breath: Secondary | ICD-10-CM | POA: Diagnosis not present

## 2023-12-05 DIAGNOSIS — I314 Cardiac tamponade: Secondary | ICD-10-CM | POA: Diagnosis not present

## 2023-12-05 DIAGNOSIS — R931 Abnormal findings on diagnostic imaging of heart and coronary circulation: Secondary | ICD-10-CM | POA: Diagnosis not present

## 2023-12-05 DIAGNOSIS — E119 Type 2 diabetes mellitus without complications: Secondary | ICD-10-CM | POA: Diagnosis not present

## 2023-12-05 DIAGNOSIS — Z959 Presence of cardiac and vascular implant and graft, unspecified: Secondary | ICD-10-CM | POA: Diagnosis not present

## 2023-12-05 DIAGNOSIS — J811 Chronic pulmonary edema: Secondary | ICD-10-CM | POA: Diagnosis not present

## 2023-12-05 DIAGNOSIS — N179 Acute kidney failure, unspecified: Secondary | ICD-10-CM | POA: Diagnosis not present

## 2023-12-05 DIAGNOSIS — G8918 Other acute postprocedural pain: Secondary | ICD-10-CM | POA: Diagnosis not present

## 2023-12-05 DIAGNOSIS — I480 Paroxysmal atrial fibrillation: Secondary | ICD-10-CM | POA: Diagnosis not present

## 2023-12-05 DIAGNOSIS — D509 Iron deficiency anemia, unspecified: Secondary | ICD-10-CM | POA: Diagnosis not present

## 2023-12-05 DIAGNOSIS — J9811 Atelectasis: Secondary | ICD-10-CM | POA: Diagnosis not present

## 2023-12-05 DIAGNOSIS — E785 Hyperlipidemia, unspecified: Secondary | ICD-10-CM | POA: Diagnosis not present

## 2023-12-05 DIAGNOSIS — Z8679 Personal history of other diseases of the circulatory system: Secondary | ICD-10-CM | POA: Diagnosis not present

## 2023-12-05 DIAGNOSIS — G4733 Obstructive sleep apnea (adult) (pediatric): Secondary | ICD-10-CM | POA: Diagnosis not present

## 2023-12-05 DIAGNOSIS — K219 Gastro-esophageal reflux disease without esophagitis: Secondary | ICD-10-CM | POA: Diagnosis not present

## 2023-12-05 DIAGNOSIS — I3139 Other pericardial effusion (noninflammatory): Secondary | ICD-10-CM | POA: Diagnosis not present

## 2023-12-05 DIAGNOSIS — I2510 Atherosclerotic heart disease of native coronary artery without angina pectoris: Secondary | ICD-10-CM | POA: Diagnosis not present

## 2023-12-05 DIAGNOSIS — R0902 Hypoxemia: Secondary | ICD-10-CM | POA: Diagnosis not present

## 2023-12-05 DIAGNOSIS — G2581 Restless legs syndrome: Secondary | ICD-10-CM | POA: Diagnosis not present

## 2023-12-05 DIAGNOSIS — R918 Other nonspecific abnormal finding of lung field: Secondary | ICD-10-CM | POA: Diagnosis not present

## 2023-12-05 DIAGNOSIS — J90 Pleural effusion, not elsewhere classified: Secondary | ICD-10-CM | POA: Diagnosis not present

## 2023-12-05 DIAGNOSIS — I4891 Unspecified atrial fibrillation: Secondary | ICD-10-CM | POA: Diagnosis not present

## 2023-12-05 DIAGNOSIS — E871 Hypo-osmolality and hyponatremia: Secondary | ICD-10-CM | POA: Diagnosis not present

## 2023-12-05 NOTE — Telephone Encounter (Signed)
 Notified Hulon Magic & Devra Fontana with [employer] of ONO order-Toni

## 2023-12-06 ENCOUNTER — Ambulatory Visit: Admitting: Internal Medicine

## 2023-12-06 ENCOUNTER — Telehealth: Payer: Self-pay | Admitting: Internal Medicine

## 2023-12-06 NOTE — Telephone Encounter (Signed)
 Notified patient of ugi appointment date, arrival time, location and npo after midnight & chest CT-Toni

## 2023-12-07 ENCOUNTER — Ambulatory Visit

## 2023-12-12 ENCOUNTER — Ambulatory Visit: Admission: RE | Admit: 2023-12-12 | Source: Ambulatory Visit

## 2023-12-14 ENCOUNTER — Ambulatory Visit: Admitting: Internal Medicine

## 2023-12-14 ENCOUNTER — Encounter: Payer: Self-pay | Admitting: Internal Medicine

## 2023-12-14 VITALS — BP 112/62 | HR 84 | Temp 98.2°F | Resp 18 | Ht 67.0 in | Wt 165.0 lb

## 2023-12-14 DIAGNOSIS — I300 Acute nonspecific idiopathic pericarditis: Secondary | ICD-10-CM | POA: Insufficient documentation

## 2023-12-14 DIAGNOSIS — E1159 Type 2 diabetes mellitus with other circulatory complications: Secondary | ICD-10-CM | POA: Diagnosis not present

## 2023-12-14 DIAGNOSIS — I480 Paroxysmal atrial fibrillation: Secondary | ICD-10-CM | POA: Diagnosis not present

## 2023-12-14 MED ORDER — INSULIN GLARGINE 100 UNITS/ML SOLOSTAR PEN: 8.0000 [IU] | PEN_INJECTOR | Freq: Every day | SUBCUTANEOUS | 2 refills | Status: AC

## 2023-12-14 NOTE — Assessment & Plan Note (Signed)
 Her had a pericardial effusion with cardiac tamponade with drainage at [facility].  They have placed him on a tapering course of prednisone  as well as colchicine .  He will followup with cardiology and they will continue to monitor with serial [facility].  I am going to check his BMP today with magnesium  level.

## 2023-12-14 NOTE — Assessment & Plan Note (Signed)
 Plan as above.

## 2023-12-14 NOTE — Assessment & Plan Note (Signed)
 I went over his FSBS diary and he is running 200-300's.  I am going to add lantus  8 Units daily and they will continue with his SSI.

## 2023-12-14 NOTE — Progress Notes (Signed)
 Office Visit  Subjective   Patient ID: Kristopher [name]   DOB: Feb 11, 1951   Age: 73 y.o.   MRN: [id number]   Chief Complaint Chief Complaint  Patient presents with   Acute Visit    Hospital F/U     History of Present Illness Kristopher [name] is a 73 year old male who comes in today for a hospital followup where Kristopher [name] was admitted to [facility] from 12/05/2023 until 12/12/2023 due to SOB.  The patient was seen at Kristopher [name] cardiologist office on 12/05/2023 where they were performing ECHO due to SOB.  This ECHO on 12/05/2023 showed a normal LV systolic function with LVEF of >55% with mild LVH.  Kristopher [name] diastolic function could not be determined.  Kristopher [name] had normal RV systolic function.  There was a large pericardial effusion noted and had a known chronic pericardial effusion that was also large on Kristopher [name] ECHO from 07/22/2014.  There was concern for tamponade as Kristopher [name] was having SOB with nausea and vomiting in the cardiology office.  Kristopher [name] was sent to [facility] where Kristopher [name] has a known history of CAD s/p PCI to distal RCA with PTCA, type 2 diabetes, ischemic cardiomyopathy, OSA, A-fib s/p ablation (2021) on Eliquis. The patient was admitted to the CICU for pericardial effusion with plans to go to cath lab the next morning for a pericardiocentesis with drain placement.  Again, Kristopher [name] had a longstanding pericardial effusion at least as far back as 2015, but this had been stable.  Kristopher [name] underwent pericardiocentesis with drain placement on 6/19.  Fluid analysis showed exudative per lights criteria. Kristopher [name] drain was removed 6/21. Kristopher [name] had an elevated ESR/CRP. Weakly positive ANA (1:40), anti CCP, RF, ENA negative.  They did a repeat ECHO on 12/08/2023 and this showed a small pericardial effusion.  Kristopher [name] pericardial cultures with bacterial was no growth and Kristopher [name] fungal/mycobacterial cultures are still pending.  Kristopher [name] had a cardiac MRI done that showed a loculated pericardial effusion in the inferior RV wall (max extent 17mm) w/ fibrinous strands; pericardial enhancement suggestive of  inflammation; no pericardial thickening, some increased septal shift c/w ventricular interdependence. They felt Kristopher [name] had recurrent pericarditis where Kristopher [name] was sent hom on a prednisone  taper as well as colchicine  x 5 month treatment for recurrent pericarditis.  They want to do serial TTE as an outpatient for monitoring of pericardial effusion reaccumulation.  They placed him on magnesium  as well in the hospital but Kristopher [name] was not sent home on this.  Kristopher [name] did require a short period of oxygen but was sent home on RA. Kristopher [name] did have some complication of A.Fib with RVR where Kristopher [name] was given an amiodarone  bolus.  Kristopher [name] was placed on diltiazem  and sent home on diltiazem  ER 12hr 60mg  BID and they stopped Kristopher [name] niacin  and Kristopher [name] actos .  Kristopher [name] was started on jardiance 25mg  daily and they placed him on humalog SSI as Kristopher [name] had hyperglycemia and Kristopher [name] HgBA1c was 9.1%/  Kristopher [name] had some iron  deficiency anemia where they gave him IV iron .  Today, Kristopher [name] states Kristopher [name] nausea/vomiting is resolved.  Kristopher [name] is still having diarrhea.  Today, Kristopher [name] SOB is improved.  There is no chest pain, edema.  Kristopher [name] states Kristopher [name] can still feel Kristopher [name] A. Fib at times.  Kristopher [name] goes back to cardiology in the near future and they are supposed to call him about this.         Past Medical History Past Medical History:  Diagnosis Date   Anemia 12/2020   Aortic atherosclerosis (HCC)  Atrial fibrillation (HCC)    a. s/p PVI X2 by Dr Britta at [facility] around 2000.   CAD (coronary artery disease)    a. NSTEMI (06/2013):  LHC (06/27/2013):  pLAD 10-20, RCA 90, EF 45-50%, inf HK.  PCI:  Xience Alpine (3 x 23 mm) DES to the RCA. b. Relook cath 07/10/13 - patent stent. Imdur  added. c. Recurrent CP after that - abnormal nuc 07/2017 with chest pain, dyspnea, hypotensive response. Rest images with mid-basal inferior thinning. No stress images due to abrupt d/c of test. Relook cath 3/12 - no significant r   Chronic back pain greater than 3 months duration    Cirrhosis, non-alcoholic (HCC)    Depression    DJD  (degenerative joint disease) of cervical spine    3 herniated discs (09/01/2013)   DJD (degenerative joint disease), lumbosacral    bulging L5-S1 (09/01/2013)   Dysrhythmia    PVCs. A.fib with two ablations   Herniated cervical disc    c2-T1 fusion   History of kidney stones    turned into urosepsis   HLD (hyperlipidemia)    Ischemic cardiomyopathy    a. Echo (06/28/2011): Inferior and inferoseptal akinesis, EF 40%, grade 2 diastolic dysfunction, MAC, small effusion (no tamponade). B. EF 40% by echo 06/2013, cath 07/2013, 55% by cath 08/2013 with subtle mild mid inferior hypocontractility.   Liver cirrhosis secondary to NASH [facility])    Lumbar herniated disc    Neuromuscular disorder (HCC)    bilateral neuropathy legs   NSTEMI (non-ST elevated myocardial infarction) (HCC) 06/27/2013   Osteoarthritis    neck and lower back (09/01/2013)   Pericardial effusion    a. Chronic since hx of pericarditis in 2004. b. Mod by echo 06/2013, small by echo 06/2013.   Restless leg syndrome    Type II diabetes mellitus (HCC)      Allergies Allergies  Allergen Reactions   Codeine Nausea And Vomiting   Duragesic -100 [Fentanyl ] Nausea And Vomiting   Hydrocodone-Acetaminophen  Nausea And Vomiting   Naprosyn [Naproxen] Nausea Only   Other Nausea And Vomiting    1. N/V to all opiates. Kristopher [name] is able to take versed . (noted 09/29/13) 2.The patient states that Kristopher [name] has had  a reaction to all narcotics. Kristopher [name] can take them though. (noted 06/26/13)     Medications  Current Outpatient Medications:    colchicine  0.6 MG tablet, Take 0.6 mg by mouth daily., Disp: , Rfl:    diltiazem  (CARDIZEM  SR) 120 MG 12 hr capsule, Take 120 mg by mouth every 36 (thirty-six) hours. (Patient taking differently: Take 60 mg by mouth 2 (two) times daily.), Disp: , Rfl:    prednisoLONE acetate (PRED FORTE) 1 % ophthalmic suspension, Place 1 drop into the right eye 4 (four) times daily., Disp: , Rfl:    atorvastatin  (LIPITOR ) 80 MG tablet, TAKE  1 TABLET BY MOUTH EVERY DAY, Disp: 90 tablet, Rfl: 1   baclofen  (LIORESAL ) 10 MG tablet, TAKE 1 TABLET BY MOUTH THREE TIMES A DAY AS NEEDED FOR MUSCLE SPASM, Disp: 270 tablet, Rfl: 1   busPIRone  (BUSPAR ) 5 MG tablet, Take 1 tablet (5 mg total) by mouth 2 (two) times daily., Disp: 60 tablet, Rfl: 2   escitalopram  (LEXAPRO ) 20 MG tablet, TAKE 1 TABLET BY MOUTH EVERY DAY, Disp: 90 tablet, Rfl: 1   gabapentin  (NEURONTIN ) 600 MG tablet, TAKE 1 TABLET BY MOUTH THREE TIMES A DAY, Disp: 180 tablet, Rfl: 5   HUMALOG KWIKPEN 100 UNIT/ML KwikPen, PLEASE SEE ATTACHED FOR DETAILED  DIRECTIONS, Disp: , Rfl:    metFORMIN  (GLUCOPHAGE -XR) 500 MG 24 hr tablet, Take 1,000 mg by mouth 2 (two) times daily., Disp: , Rfl:    Misc Natural Products (PUMPKIN SEED OIL) CAPS, Take 1 capsule by mouth daily. 3000 mg, Disp: , Rfl:    pantoprazole  (PROTONIX ) 40 MG tablet, Take 1 tablet (40 mg total) by mouth daily., Disp: 90 tablet, Rfl: 1   promethazine  (PHENERGAN ) 25 MG tablet, Take 1 tablet (25 mg total) by mouth every 6 (six) hours as needed., Disp: 30 tablet, Rfl: 5   rOPINIRole  (REQUIP ) 1 MG tablet, Take 2 tablets (2 mg total) by mouth at bedtime., Disp: 90 tablet, Rfl: 3   Review of Systems Review of Systems  Constitutional:  Negative for chills and fever.  Eyes:  Negative for blurred vision and double vision.  Respiratory:  Positive for cough and shortness of breath. Negative for hemoptysis and wheezing.   Cardiovascular:  Positive for palpitations. Negative for chest pain and leg swelling.  Gastrointestinal:  Negative for abdominal pain, blood in stool, constipation, diarrhea, melena, nausea and vomiting.  Genitourinary:  Negative for frequency and hematuria.  Musculoskeletal:  Negative for myalgias.  Skin:  Negative for itching and rash.  Neurological:  Negative for dizziness, weakness and headaches.  Endo/Heme/Allergies:  Negative for polydipsia.       Objective:    Vitals BP 112/62   Pulse 84   Temp 98.2  F (36.8 C)   Resp 18   Ht 5' 7 (1.702 m)   Wt 165 lb (74.8 kg)   SpO2 93%   BMI 25.84 kg/m    Physical Examination Physical Exam Constitutional:      Appearance: Normal appearance. Kristopher [name] is not ill-appearing.   Cardiovascular:     Rate and Rhythm: Normal rate and regular rhythm.     Pulses: Normal pulses.     Heart sounds: No murmur heard.    No friction rub. No gallop.  Pulmonary:     Effort: Pulmonary effort is normal. No respiratory distress.     Breath sounds: No wheezing, rhonchi or rales.  Abdominal:     General: Abdomen is flat. Bowel sounds are normal. There is no distension.     Palpations: Abdomen is soft.     Tenderness: There is no abdominal tenderness.   Musculoskeletal:     Right lower leg: No edema.     Left lower leg: No edema.   Skin:    General: Skin is warm and dry.     Findings: No rash.   Neurological:     General: No focal deficit present.     Mental Status: Kristopher [name] is alert and oriented to person, place, and time.   Psychiatric:        Mood and Affect: Mood normal.        Behavior: Behavior normal.        Assessment & Plan:   Recurrent idiopathic pericarditis Her had a pericardial effusion with cardiac tamponade with drainage at [facility].  They have placed him on a tapering course of prednisone  as well as colchicine .  Kristopher [name] will followup with cardiology and they will continue to monitor with serial [facility].  I am going to check Kristopher [name] BMP today with magnesium  level.  Type 2 diabetes mellitus with other circulatory complications (HCC) I went over Kristopher [name] FSBS diary and Kristopher [name] is running 200-300's.  I am going to add lantus  8 Units daily and they will continue with Kristopher [name] SSI.  Hypomagnesemia Plan  as above.    No follow-ups on file.   Selinda Fleeta Finger, MD

## 2023-12-15 LAB — CBC WITH DIFFERENTIAL/PLATELET
Basophils Absolute: 0 10*3/uL (ref 0.0–0.2)
Basos: 0 %
EOS (ABSOLUTE): 0 10*3/uL (ref 0.0–0.4)
Eos: 0 %
Hematocrit: 35.7 % — ABNORMAL LOW (ref 37.5–51.0)
Hemoglobin: 11.8 g/dL — ABNORMAL LOW (ref 13.0–17.7)
Immature Grans (Abs): 0.1 10*3/uL (ref 0.0–0.1)
Immature Granulocytes: 1 %
Lymphocytes Absolute: 0.8 10*3/uL (ref 0.7–3.1)
Lymphs: 7 %
MCH: 31.6 pg (ref 26.6–33.0)
MCHC: 33.1 g/dL (ref 31.5–35.7)
MCV: 96 fL (ref 79–97)
Monocytes Absolute: 0.3 10*3/uL (ref 0.1–0.9)
Monocytes: 2 %
Neutrophils Absolute: 10.4 10*3/uL — ABNORMAL HIGH (ref 1.4–7.0)
Neutrophils: 90 %
Platelets: 272 10*3/uL (ref 150–450)
RBC: 3.74 x10E6/uL — ABNORMAL LOW (ref 4.14–5.80)
RDW: 13.8 % (ref 11.6–15.4)
WBC: 11.5 10*3/uL — ABNORMAL HIGH (ref 3.4–10.8)

## 2023-12-15 LAB — CMP14 + ANION GAP
ALT: 31 IU/L (ref 0–44)
AST: 27 IU/L (ref 0–40)
Albumin: 4.2 g/dL (ref 3.9–4.9)
Alkaline Phosphatase: 135 IU/L — ABNORMAL HIGH (ref 44–121)
Anion Gap: 16 mmol/L (ref 10.0–18.0)
BUN/Creatinine Ratio: 20 (ref 10–24)
BUN: 24 mg/dL (ref 8–27)
Bilirubin Total: 1.2 mg/dL (ref 0.0–1.2)
CO2: 22 mmol/L (ref 20–29)
Calcium: 9.7 mg/dL (ref 8.6–10.2)
Chloride: 95 mmol/L — ABNORMAL LOW (ref 96–106)
Creatinine, Ser: 1.21 mg/dL (ref 0.76–1.27)
Globulin, Total: 2.4 g/dL (ref 1.5–4.5)
Glucose: 220 mg/dL — ABNORMAL HIGH (ref 70–99)
Potassium: 4.6 mmol/L (ref 3.5–5.2)
Sodium: 133 mmol/L — ABNORMAL LOW (ref 134–144)
Total Protein: 6.6 g/dL (ref 6.0–8.5)
eGFR: 64 mL/min/{1.73_m2} (ref >59)

## 2023-12-15 LAB — MAGNESIUM: Magnesium: 1.7 mg/dL (ref 1.6–2.3)

## 2023-12-18 ENCOUNTER — Ambulatory Visit: Payer: Self-pay

## 2023-12-18 NOTE — Addendum Note (Signed)
 Addended by: Caera Enwright A on: 12/18/2023 03:56 PM   Modules accepted: Orders

## 2023-12-18 NOTE — Progress Notes (Signed)
 Patient called.  Left message for patient to call back.  I have called and left the patient a voicemail to return our phone call. The patient needs to be informed His labs look good and his magnesium  level seems to be doing well. SABRA

## 2023-12-18 NOTE — Progress Notes (Signed)
 Patient called.  Patient aware.  The patient returned our phone call and I have informed His labs look good and his magnesium  level seems to be doing well. SABRA  Pt aware.

## 2023-12-27 ENCOUNTER — Ambulatory Visit
Admission: RE | Admit: 2023-12-27 | Discharge: 2023-12-27 | Disposition: A | Discharge status: Home / Self Care | Source: Ambulatory Visit | Attending: Internal Medicine | Admitting: Internal Medicine

## 2023-12-27 DIAGNOSIS — R053 Chronic cough: Secondary | ICD-10-CM | POA: Insufficient documentation

## 2023-12-27 DIAGNOSIS — R918 Other nonspecific abnormal finding of lung field: Secondary | ICD-10-CM | POA: Diagnosis not present

## 2023-12-27 DIAGNOSIS — R0602 Shortness of breath: Secondary | ICD-10-CM

## 2023-12-27 DIAGNOSIS — K224 Dyskinesia of esophagus: Secondary | ICD-10-CM | POA: Diagnosis not present

## 2023-12-27 DIAGNOSIS — R1310 Dysphagia, unspecified: Secondary | ICD-10-CM | POA: Diagnosis not present

## 2023-12-27 DIAGNOSIS — I3139 Other pericardial effusion (noninflammatory): Secondary | ICD-10-CM | POA: Diagnosis not present

## 2023-12-27 DIAGNOSIS — J439 Emphysema, unspecified: Secondary | ICD-10-CM | POA: Diagnosis not present

## 2023-12-31 DIAGNOSIS — I480 Paroxysmal atrial fibrillation: Secondary | ICD-10-CM | POA: Diagnosis not present

## 2023-12-31 DIAGNOSIS — E119 Type 2 diabetes mellitus without complications: Secondary | ICD-10-CM | POA: Diagnosis not present

## 2023-12-31 DIAGNOSIS — I314 Cardiac tamponade: Secondary | ICD-10-CM | POA: Diagnosis not present

## 2023-12-31 DIAGNOSIS — E782 Mixed hyperlipidemia: Secondary | ICD-10-CM | POA: Diagnosis not present

## 2023-12-31 DIAGNOSIS — I2510 Atherosclerotic heart disease of native coronary artery without angina pectoris: Secondary | ICD-10-CM | POA: Diagnosis not present

## 2023-12-31 DIAGNOSIS — I3139 Other pericardial effusion (noninflammatory): Secondary | ICD-10-CM | POA: Diagnosis not present

## 2024-01-02 ENCOUNTER — Ambulatory Visit: Admitting: Internal Medicine

## 2024-01-02 ENCOUNTER — Encounter: Payer: Self-pay | Admitting: Internal Medicine

## 2024-01-02 VITALS — BP 102/58 | HR 70 | Temp 98.6°F | Resp 16 | Ht 67.0 in | Wt 168.4 lb

## 2024-01-02 DIAGNOSIS — I480 Paroxysmal atrial fibrillation: Secondary | ICD-10-CM | POA: Diagnosis not present

## 2024-01-02 DIAGNOSIS — E7800 Pure hypercholesterolemia, unspecified: Secondary | ICD-10-CM | POA: Diagnosis not present

## 2024-01-02 DIAGNOSIS — E1159 Type 2 diabetes mellitus with other circulatory complications: Secondary | ICD-10-CM

## 2024-01-02 DIAGNOSIS — R0602 Shortness of breath: Secondary | ICD-10-CM | POA: Insufficient documentation

## 2024-01-02 NOTE — Assessment & Plan Note (Signed)
 We will check his CMP and Mg2+ today.  They are setting him up to see EP at this time.  He remains on eliquis at this time.  He is on cardizem  60mg  BID.  He is in sinus rhythm on my exam today.

## 2024-01-02 NOTE — Assessment & Plan Note (Signed)
 We will check his HGBA1c at this time.  I want him to increase his lantus  to 16 Units daily and increase by 3 Units every 3 days until his FSBS is consistently less than 150.

## 2024-01-02 NOTE — Assessment & Plan Note (Signed)
 He is being seen by both cardiology and pulmonary and he has had a recent CT of his chest.  They are waiting to do pulse oximetry and formal PFT's.  He is also having a repeat ECHO arrnaged and they are discussing doing an ischemic evaluation.   His SOB is improved but has not resolved.

## 2024-01-02 NOTE — Progress Notes (Signed)
 Office Visit  Subjective   Patient ID: Kristopher [name]   DOB: April 22, 1951   Age: 73 y.o.   MRN: [id number]   Chief Complaint Chief Complaint  Patient presents with   Follow-up     History of Present Illness The patient is a 73 year old Caucasian/White male who returns for a follow-up visit for his T2 diabetes. This past year his HgBA1c was elevated and I wanted to start him on ozempic but he wanted to hold off and talk to his endocrinologist first.  They wanted him to remain on metformin  and Actos .  His last visit with endocrinology was a telemedicine visit in 06/2023 and they did not change his medications.  He was diagnosed with T2 diabetes around 2000. Since the last visit, he was again hospitalized for pericardial tamponade.  They did stop him off his actos  and started him on jardiance 25mg  daily and humalog sliding scale.  I was called by his wife after hospitalization where he had elevated blood sugar and we started him on lantus .  He is currently on:  Lantus  8 Units daily, humalog SSI 2-8 Units qAC, metformin  1000mg  BID and jardiance 25mg  daily  I tried him on farxiga 5mg  daily in the past but he could not afford this.  He is back to exercising by walking 3 miles per day.  He specifically denies unexplained abdominal pain, or documented hypoglycemia. He checks blood sugars qAC 190-400's.  He is currently on a prednisone  taper.  His last HgBA1c was done 3 months ago and was 7.1%.  He came in fasting today in anticipation of lab work. He does have complications of possible diabetic neuropathy involving his feet.  However he has had a laminectomy in the past which could contribute to this pain.  He remains on gabapentin  600mg  TID for tingling and mild pain which has not worsened. The neurologist did also over the interim start him on Vit B12 supplementation due to deficiency.  He also has complications of diabetic CAD. There is no diabetic retinopathy or nephropathy.   His last dilated eye exam was done  on 04/18/2023 and this showed no evidence of diabetic retinopathy.     [name] was hospitalized at [facility] from 12/05/2023 until 12/12/2023 due to SOB.  The patient was seen at his cardiologist office on 12/05/2023 where they were performing ECHO due to SOB.  This ECHO on 12/05/2023 showed a normal LV systolic function with LVEF of >55% with mild LVH.  His diastolic function could not be determined.  He had normal RV systolic function.  There was a large pericardial effusion noted and had a known chronic pericardial effusion that was also large on his ECHO from 07/22/2014.  There was concern for tamponade as he was having SOB with nausea and vomiting in the cardiology office.  He was sent to [facility] ER where he has a known history of CAD s/p PCI to distal RCA with PTCA, type 2 diabetes, ischemic cardiomyopathy, OSA, A-fib s/p ablation (2021) on Eliquis. The patient was admitted to the CICU for pericardial effusion with plans to go to cath lab the next morning for a pericardiocentesis with drain placement.  Again, he had a longstanding pericardial effusion at least as far back as 2015, but this had been stable.  He underwent pericardiocentesis with drain placement on 6/19.  Fluid analysis showed exudative per lights criteria. His drain was removed 6/21. He had an elevated ESR/CRP. Weakly positive ANA (1:40), anti CCP, RF, ENA  negative.  They did a repeat ECHO on 12/08/2023 and this showed a small pericardial effusion.  His pericardial cultures with bacterial was no growth and his fungal/mycobacterial cultures are still pending.  He had a cardiac MRI done that showed a loculated pericardial effusion in the inferior RV wall (max extent 17mm) w/ fibrinous strands; pericardial enhancement suggestive of inflammation; no pericardial thickening, some increased septal shift c/w ventricular interdependence. They felt he had recurrent pericarditis where he was sent hom on a prednisone  taper as well as colchicine  x 5 month treatment for  recurrent pericarditis.  They want to do serial TTE as an outpatient for monitoring of pericardial effusion reaccumulation.  They placed him on magnesium  as well in the hospital but he was not sent home on this.  He did require a short period of oxygen but was sent home on RA. He did have some complication of A.Fib with RVR where he was given an amiodarone  bolus.  He was placed on diltiazem  and sent home on diltiazem  ER 12hr 60mg  BID and they stopped his niacin  and his actos .  He was started on jardiance 25mg  daily and they placed him on humalog SSI as he had hyperglycemia and his HgBA1c was 9.1%/  He had some iron  deficiency anemia where they gave him IV iron .  Today, he states his chronic nausea/vomiting is resolved.  His SOB is better but not completely resolved.  There is no chest pain, edema.  He states he can still feel his A. Fib at times.  He did go back and see cardiology in followup on 12/31/2023 where he was continuing with A. Fib with RVR.  Recent Holter with increased Afib burden in addition to NSVT and SVT.   They want to consider updated ischemic evaluation and re-referral to EP service for consideration of antiarrhythmic therapy and/or additional ablation. Want to avoid tachycardia mediated heart failure/CM. Will also discuss referral to Rheumatology given effusion recurrence.  He did see pulmonary in followup on 12/04/2023 where again he has had this continued SOB.  He noted that the patient has increasing symptoms of shortness of breath.  His wife notes that he has been having desaturations rather nasally.  They did a 6-minute walk and it did not show significant desaturation but he could still be having nocturnal desaturations.  In addition he does have an echocardiogram that is already scheduled.  They have ordered an overnight pulse oximetry and they are bringing him back next month for formal PFT's.  They also ordered a CT chest which was done on 12/27/2023 and this showed small right pleural  effusion and associated atelectasis or consolidation.  Minimal paraseptal emphysema.   Diffuse bilateral bronchial wall thickening, consistent with infectious or inflammatory bronchitis.  No evidence of fibrotic interstitial lung disease.  Coarse contour of the liver, suggestive of cirrhosis.  Coronary artery disease with aortic atherosclerosis and Emphysema.  He has an appointment coming up to discuss this with pulmonary.     Nickson Middlesworth returns today for routine followup on his cholesterol.  The patient has a history of both diabetes and CAD.  This past year, his LDL was not at goal and I asked him to start taking crestor  10mg  on Sat/Sun and 5mg  the rest of the week.  We want his LDL goal <55 but he was noted to be 70 in 06/2023.  I asked him to increase his crestor  to 10mg  nightly.  He noticed that he had myalgias and fatigue now with the increased  dose of crestor .  He has not been on atorvastatin  in the past and he was changed to atorvastatin    He specifically denies abdominal pain, diarrhea, or other problems.   He remains on dietary management as well as the following cholesterol lowering medications atorvastatin  80mg  nightly.       Past Medical History Past Medical History:  Diagnosis Date   Anemia 12/2020   Aortic atherosclerosis (HCC)    Atrial fibrillation (HCC)    a. s/p PVI X2 by Dr Britta at [facility] around 2000.   CAD (coronary artery disease)    a. NSTEMI (06/2013):  LHC (06/27/2013):  pLAD 10-20, RCA 90, EF 45-50%, inf HK.  PCI:  Xience Alpine (3 x 23 mm) DES to the RCA. b. Relook cath 07/10/13 - patent stent. Imdur  added. c. Recurrent CP after that - abnormal nuc 07/2017 with chest pain, dyspnea, hypotensive response. Rest images with mid-basal inferior thinning. No stress images due to abrupt d/c of test. Relook cath 3/12 - no significant r   Chronic back pain greater than 3 months duration    Cirrhosis, non-alcoholic (HCC)    Depression    DJD (degenerative joint disease) of cervical  spine    3 herniated discs (09/01/2013)   DJD (degenerative joint disease), lumbosacral    bulging L5-S1 (09/01/2013)   Dysrhythmia    PVCs. A.fib with two ablations   Herniated cervical disc    c2-T1 fusion   History of kidney stones    turned into urosepsis   HLD (hyperlipidemia)    Ischemic cardiomyopathy    a. Echo (06/28/2011): Inferior and inferoseptal akinesis, EF 40%, grade 2 diastolic dysfunction, MAC, small effusion (no tamponade). B. EF 40% by echo 06/2013, cath 07/2013, 55% by cath 08/2013 with subtle mild mid inferior hypocontractility.   Liver cirrhosis secondary to NASH [facility])    Lumbar herniated disc    Neuromuscular disorder (HCC)    bilateral neuropathy legs   NSTEMI (non-ST elevated myocardial infarction) (HCC) 06/27/2013   Osteoarthritis    neck and lower back (09/01/2013)   Pericardial effusion    a. Chronic since hx of pericarditis in 2004. b. Mod by echo 06/2013, small by echo 06/2013.   Restless leg syndrome    Type II diabetes mellitus (HCC)      Allergies Allergies  Allergen Reactions   Codeine Nausea And Vomiting   Duragesic -100 [Fentanyl ] Nausea And Vomiting   Hydrocodone-Acetaminophen  Nausea And Vomiting   Naprosyn [Naproxen] Nausea Only   Other Nausea And Vomiting    1. N/V to all opiates. He is able to take versed . (noted 09/29/13) 2.The patient states that he has had  a reaction to all narcotics. He can take them though. (noted 06/26/13)     Medications  Current Outpatient Medications:    empagliflozin (JARDIANCE) 25 MG TABS tablet, Take 25 mg by mouth daily., Disp: , Rfl:    atorvastatin  (LIPITOR ) 80 MG tablet, TAKE 1 TABLET BY MOUTH EVERY DAY, Disp: 90 tablet, Rfl: 1   baclofen  (LIORESAL ) 10 MG tablet, TAKE 1 TABLET BY MOUTH THREE TIMES A DAY AS NEEDED FOR MUSCLE SPASM, Disp: 270 tablet, Rfl: 1   busPIRone  (BUSPAR ) 5 MG tablet, Take 1 tablet (5 mg total) by mouth 2 (two) times daily., Disp: 60 tablet, Rfl: 2   colchicine  0.6 MG tablet, Take 0.6  mg by mouth daily., Disp: , Rfl:    diltiazem  (CARDIZEM  SR) 120 MG 12 hr capsule, Take 120 mg by mouth every 36 (thirty-six) hours. (  Patient taking differently: Take 60 mg by mouth 2 (two) times daily.), Disp: , Rfl:    ELIQUIS 5 MG TABS tablet, Take 5 mg by mouth 2 (two) times daily., Disp: , Rfl:    escitalopram  (LEXAPRO ) 20 MG tablet, TAKE 1 TABLET BY MOUTH EVERY DAY, Disp: 90 tablet, Rfl: 1   gabapentin  (NEURONTIN ) 600 MG tablet, TAKE 1 TABLET BY MOUTH THREE TIMES A DAY, Disp: 180 tablet, Rfl: 5   HUMALOG KWIKPEN 100 UNIT/ML KwikPen, PLEASE SEE ATTACHED FOR DETAILED DIRECTIONS, Disp: , Rfl:    insulin  glargine (LANTUS ) 100 unit/mL SOPN, Inject 8 Units into the skin daily., Disp: 3 mL, Rfl: 2   metFORMIN  (GLUCOPHAGE -XR) 500 MG 24 hr tablet, Take 1,000 mg by mouth 2 (two) times daily., Disp: , Rfl:    Misc Natural Products (PUMPKIN SEED OIL) CAPS, Take 1 capsule by mouth daily. 3000 mg, Disp: , Rfl:    pantoprazole  (PROTONIX ) 40 MG tablet, Take 1 tablet (40 mg total) by mouth daily., Disp: 90 tablet, Rfl: 1   promethazine  (PHENERGAN ) 25 MG tablet, Take 1 tablet (25 mg total) by mouth every 6 (six) hours as needed., Disp: 30 tablet, Rfl: 5   rOPINIRole  (REQUIP ) 1 MG tablet, Take 2 tablets (2 mg total) by mouth at bedtime., Disp: 90 tablet, Rfl: 3   Review of Systems Review of Systems  Constitutional:  Positive for malaise/fatigue. Negative for chills and fever.  Eyes:  Negative for blurred vision and double vision.  Respiratory:  Positive for shortness of breath. Negative for cough, hemoptysis and wheezing.   Cardiovascular:  Positive for palpitations and leg swelling. Negative for chest pain.  Gastrointestinal:  Positive for diarrhea. Negative for abdominal pain, constipation, heartburn, nausea and vomiting.  Genitourinary:  Positive for frequency.  Musculoskeletal:  Positive for myalgias.  Skin:  Negative for itching and rash.  Neurological:  Negative for dizziness, weakness and headaches.   Endo/Heme/Allergies:  Negative for polydipsia.       Objective:    Vitals BP (!) 102/58   Pulse 70   Temp 98.6 F (37 C)   Resp 16   Ht 5' 7 (1.702 m)   Wt 168 lb 6.4 oz (76.4 kg)   SpO2 94%   BMI 26.38 kg/m    Physical Examination Physical Exam Constitutional:      Appearance: Normal appearance. He is not ill-appearing.  Neck:     Vascular: No carotid bruit.  Cardiovascular:     Rate and Rhythm: Normal rate and regular rhythm.     Pulses: Normal pulses.     Heart sounds: No murmur heard.    No friction rub. No gallop.  Pulmonary:     Effort: Pulmonary effort is normal. No respiratory distress.     Breath sounds: No wheezing, rhonchi or rales.  Abdominal:     General: Abdomen is flat. Bowel sounds are normal. There is no distension.     Palpations: Abdomen is soft.     Tenderness: There is no abdominal tenderness.  Musculoskeletal:     Right lower leg: No edema.     Left lower leg: No edema.  Skin:    General: Skin is warm and dry.     Findings: No rash.  Neurological:     General: No focal deficit present.     Mental Status: He is alert and oriented to person, place, and time.  Psychiatric:        Mood and Affect: Mood normal.  Behavior: Behavior normal.        Assessment & Plan:   Atrial fibrillation (HCC) We will check his CMP and Mg2+ today.  They are setting him up to see EP at this time.  He remains on eliquis at this time.  He is on cardizem  60mg  BID.  He is in sinus rhythm on my exam today.  Type 2 diabetes mellitus with other circulatory complications (HCC) We will check his HGBA1c at this time.  I want him to increase his lantus  to 16 Units daily and increase by 3 Units every 3 days until his FSBS is consistently less than 150.    Hypercholesterolemia We will check his CK level as he is still having some myalgias.  I switched him from crestor  to lipitor .  We will check a FLP today with goal LDL <55.  SOB (shortness of breath) He is  being seen by both cardiology and pulmonary and he has had a recent CT of his chest.  They are waiting to do pulse oximetry and formal PFT's.  He is also having a repeat ECHO arrnaged and they are discussing doing an ischemic evaluation.   His SOB is improved but has not resolved.    Return in about 3 months (around 04/03/2024).   Selinda Fleeta Finger, MD

## 2024-01-02 NOTE — Assessment & Plan Note (Signed)
 We will check his CK level as he is still having some myalgias.  I switched him from crestor  to lipitor .  We will check a FLP today with goal LDL <55.

## 2024-01-03 DIAGNOSIS — E1165 Type 2 diabetes mellitus with hyperglycemia: Secondary | ICD-10-CM | POA: Diagnosis not present

## 2024-01-03 DIAGNOSIS — Z794 Long term (current) use of insulin: Secondary | ICD-10-CM | POA: Diagnosis not present

## 2024-01-03 DIAGNOSIS — E1142 Type 2 diabetes mellitus with diabetic polyneuropathy: Secondary | ICD-10-CM | POA: Diagnosis not present

## 2024-01-03 DIAGNOSIS — Z5189 Encounter for other specified aftercare: Secondary | ICD-10-CM | POA: Diagnosis not present

## 2024-01-03 LAB — CMP14 + ANION GAP
ALT: 23 IU/L (ref 0–44)
AST: 18 IU/L (ref 0–40)
Albumin: 4 g/dL (ref 3.8–4.8)
Alkaline Phosphatase: 86 IU/L (ref 44–121)
Anion Gap: 15 mmol/L (ref 10.0–18.0)
BUN/Creatinine Ratio: 23 (ref 10–24)
BUN: 28 mg/dL — ABNORMAL HIGH (ref 8–27)
Bilirubin Total: 0.4 mg/dL (ref 0.0–1.2)
CO2: 24 mmol/L (ref 20–29)
Calcium: 8.9 mg/dL (ref 8.6–10.2)
Chloride: 98 mmol/L (ref 96–106)
Creatinine, Ser: 1.21 mg/dL (ref 0.76–1.27)
Globulin, Total: 2 g/dL (ref 1.5–4.5)
Glucose: 331 mg/dL — ABNORMAL HIGH (ref 70–99)
Potassium: 4.9 mmol/L (ref 3.5–5.2)
Sodium: 137 mmol/L (ref 134–144)
Total Protein: 6 g/dL (ref 6.0–8.5)
eGFR: 64 mL/min/1.73 (ref >59)

## 2024-01-03 LAB — LIPID PANEL
Chol/HDL Ratio: 2.1 ratio (ref 0.0–5.0)
Cholesterol, Total: 125 mg/dL (ref 100–199)
HDL: 60 mg/dL (ref >39)
LDL Chol Calc (NIH): 45 mg/dL (ref 0–99)
Triglycerides: 112 mg/dL (ref 0–149)
VLDL Cholesterol Cal: 20 mg/dL (ref 5–40)

## 2024-01-03 LAB — CK: Total CK: 99 U/L (ref 41–331)

## 2024-01-03 LAB — HEMOGLOBIN A1C
Est. average glucose Bld gHb Est-mCnc: 237 mg/dL
Hgb A1c MFr Bld: 9.9 % — ABNORMAL HIGH (ref 4.8–5.6)

## 2024-01-03 LAB — TSH: TSH: 0.846 u[IU]/mL (ref 0.450–4.500)

## 2024-01-03 LAB — MAGNESIUM: Magnesium: 2.1 mg/dL (ref 1.6–2.3)

## 2024-01-04 ENCOUNTER — Ambulatory Visit: Payer: Self-pay

## 2024-01-04 NOTE — Progress Notes (Signed)
 Patient called.  Patient aware.  I have called and informed the patient  His labs look good but his A!c is not controlled.  Increase his lantus  at home as we discussed. SABRA  Pt aware, states he saw his Endocrinologist yesterday and they have changed his sliding scale.

## 2024-01-09 ENCOUNTER — Ambulatory Visit: Admitting: Internal Medicine

## 2024-01-09 DIAGNOSIS — R0602 Shortness of breath: Secondary | ICD-10-CM | POA: Diagnosis not present

## 2024-01-10 DIAGNOSIS — Z7901 Long term (current) use of anticoagulants: Secondary | ICD-10-CM | POA: Diagnosis not present

## 2024-01-10 DIAGNOSIS — I3139 Other pericardial effusion (noninflammatory): Secondary | ICD-10-CM | POA: Diagnosis not present

## 2024-01-10 DIAGNOSIS — I314 Cardiac tamponade: Secondary | ICD-10-CM | POA: Diagnosis not present

## 2024-01-10 DIAGNOSIS — Z959 Presence of cardiac and vascular implant and graft, unspecified: Secondary | ICD-10-CM | POA: Diagnosis not present

## 2024-01-10 DIAGNOSIS — Z9889 Other specified postprocedural states: Secondary | ICD-10-CM | POA: Diagnosis not present

## 2024-01-10 DIAGNOSIS — Z8679 Personal history of other diseases of the circulatory system: Secondary | ICD-10-CM | POA: Diagnosis not present

## 2024-01-10 DIAGNOSIS — I4891 Unspecified atrial fibrillation: Secondary | ICD-10-CM | POA: Diagnosis not present

## 2024-01-22 ENCOUNTER — Ambulatory Visit: Admitting: Internal Medicine

## 2024-01-22 ENCOUNTER — Encounter: Payer: Self-pay | Admitting: Internal Medicine

## 2024-01-22 VITALS — BP 108/73 | HR 72 | Temp 98.4°F | Resp 16 | Ht 67.0 in | Wt 174.0 lb

## 2024-01-22 DIAGNOSIS — I314 Cardiac tamponade: Secondary | ICD-10-CM | POA: Diagnosis not present

## 2024-01-22 DIAGNOSIS — G4733 Obstructive sleep apnea (adult) (pediatric): Secondary | ICD-10-CM | POA: Diagnosis not present

## 2024-01-22 DIAGNOSIS — R0602 Shortness of breath: Secondary | ICD-10-CM | POA: Diagnosis not present

## 2024-01-22 NOTE — Patient Instructions (Signed)

## 2024-01-22 NOTE — Progress Notes (Signed)
 [facility] [address] [place], [place] [address]  Pulmonary Sleep Medicine   Office Visit Note  Patient Name: Kristopher [name] DOB: [birth year] MRN [id number]  Date of Service: 01/22/2024  Complaints/HPI: He has been doing well since his last visit. He states he has been in and out of AFib. Patient is due to see EP for possible ablation. His PFT is borderline mild low. He has no cough noted at this time. Patient had pl effusion and this was drained. He had an admission to ICU for tamponade.  He also stated that he has wife has noted that he is having apneas and snorting snot through the CPAP mask.  He did not apparently think that it was much of an issue however I suggested to him that we could relook at his PSG once again and we will go ahead and get this ordered for him.  It appears that he may be having breakthrough apneas and he may need retitration to assess  Office Spirometry Results:     ROS  General: (-) fever, (-) chills, (-) night sweats, (-) weakness Skin: (-) rashes, (-) itching,. Eyes: (-) visual changes, (-) redness, (-) itching. Nose and Sinuses: (-) nasal stuffiness or itchiness, (-) postnasal drip, (-) nosebleeds, (-) sinus trouble. Mouth and Throat: (-) sore throat, (-) hoarseness. Neck: (-) swollen glands, (-) enlarged thyroid , (-) neck pain. Respiratory: - cough, (-) bloody sputum, - shortness of breath, - wheezing. Cardiovascular: - ankle swelling, (-) chest pain. Lymphatic: (-) lymph node enlargement. Neurologic: (-) numbness, (-) tingling. Psychiatric: (-) anxiety, (-) depression   Current Medication: Outpatient Encounter Medications as of 01/22/2024  Medication Sig   atorvastatin  (LIPITOR ) 80 MG tablet TAKE 1 TABLET BY MOUTH EVERY DAY   baclofen  (LIORESAL ) 10 MG tablet TAKE 1 TABLET BY MOUTH THREE TIMES A DAY AS NEEDED FOR MUSCLE SPASM   busPIRone  (BUSPAR ) 5 MG tablet Take 1 tablet (5 mg total) by mouth 2 (two) times daily.   colchicine  0.6 MG  tablet Take 0.6 mg by mouth daily.   diltiazem  (CARDIZEM  SR) 120 MG 12 hr capsule Take 120 mg by mouth every 36 (thirty-six) hours. (Patient taking differently: Take 60 mg by mouth 2 (two) times daily.)   ELIQUIS 5 MG TABS tablet Take 5 mg by mouth 2 (two) times daily.   empagliflozin (JARDIANCE) 25 MG TABS tablet Take 25 mg by mouth daily.   escitalopram  (LEXAPRO ) 20 MG tablet TAKE 1 TABLET BY MOUTH EVERY DAY   gabapentin  (NEURONTIN ) 600 MG tablet TAKE 1 TABLET BY MOUTH THREE TIMES A DAY   HUMALOG KWIKPEN 100 UNIT/ML KwikPen PLEASE SEE ATTACHED FOR DETAILED DIRECTIONS   insulin  glargine (LANTUS ) 100 unit/mL SOPN Inject 8 Units into the skin daily.   metFORMIN  (GLUCOPHAGE -XR) 500 MG 24 hr tablet Take 1,000 mg by mouth 2 (two) times daily.   Misc Natural Products (PUMPKIN SEED OIL) CAPS Take 1 capsule by mouth daily. 3000 mg   pantoprazole  (PROTONIX ) 40 MG tablet Take 1 tablet (40 mg total) by mouth daily.   promethazine  (PHENERGAN ) 25 MG tablet Take 1 tablet (25 mg total) by mouth every 6 (six) hours as needed.   rOPINIRole  (REQUIP ) 1 MG tablet Take 2 tablets (2 mg total) by mouth at bedtime.   No facility-administered encounter medications on file as of 01/22/2024.    Surgical History: Past Surgical History:  Procedure Laterality Date   APPENDECTOMY  ~ 1965   ATRIAL FIBRILLATION ABLATION  06/19/1998   Dr Britta  at [facility]   BIOPSY  06/15/2022   Procedure: BIOPSY;  Surgeon: Stacia Glendia BRAVO, MD;  Location: [facility];  Service: Gastroenterology;;   CARDIAC CATHETERIZATION     COLONOSCOPY WITH PROPOFOL  N/A 06/15/2022   Procedure: COLONOSCOPY WITH PROPOFOL ;  Surgeon: Stacia Glendia BRAVO, MD;  Location: [facility];  Service: Gastroenterology;  Laterality: N/A;   CORONARY ANGIOPLASTY     CYSTOSCOPY WITH RETROGRADE URETHROGRAM N/A 07/21/2021   Procedure: CYSTOSCOPY WITH URETHRAL DILATION;  Surgeon: Cam Morene ORN, MD;  Location: [facility];  Service: Urology;  Laterality: N/A;  45 MINS    ESOPHAGOGASTRODUODENOSCOPY (EGD) WITH PROPOFOL  N/A 06/15/2022   Procedure: ESOPHAGOGASTRODUODENOSCOPY (EGD) WITH PROPOFOL ;  Surgeon: Stacia Glendia BRAVO, MD;  Location: [facility];  Service: Gastroenterology;  Laterality: N/A;   LEFT AND RIGHT HEART CATHETERIZATION WITH CORONARY ANGIOGRAM N/A 10/07/2013   Procedure: LEFT AND RIGHT HEART CATHETERIZATION WITH CORONARY ANGIOGRAM;  Surgeon: Debby DELENA Sor, MD;  Location: [facility] CATH LAB;  Service: Cardiovascular;  Laterality: N/A;   LEFT HEART CATHETERIZATION WITH CORONARY ANGIOGRAM N/A 06/27/2013   Procedure: LEFT HEART CATHETERIZATION WITH CORONARY ANGIOGRAM;  Surgeon: Debby DELENA Sor, MD;  Location: [facility] CATH LAB;  Service: Cardiovascular;  Laterality: N/A;   LEFT HEART CATHETERIZATION WITH CORONARY ANGIOGRAM N/A 07/10/2013   Procedure: LEFT HEART CATHETERIZATION WITH CORONARY ANGIOGRAM;  Surgeon: Lonni JONETTA Cash, MD;  Location: [facility] CATH LAB;  Service: Cardiovascular;  Laterality: N/A;   LEFT HEART CATHETERIZATION WITH CORONARY ANGIOGRAM N/A 08/28/2013   Procedure: LEFT HEART CATHETERIZATION WITH CORONARY ANGIOGRAM;  Surgeon: Debby DELENA Sor, MD;  Location: [facility] CATH LAB;  Service: Cardiovascular;  Laterality: N/A;   NEPHROLITHOTOMY Left 02/11/2021   Procedure: LEFT NEPHROLITHOTOMY PERCUTANEOUS WITH SURGEON ACCESS;  Surgeon: Cam Morene ORN, MD;  Location: [facility];  Service: Urology;  Laterality: Left;   PERCUTANEOUS CORONARY STENT INTERVENTION (PCI-S)  06/27/2013   Procedure: PERCUTANEOUS CORONARY STENT INTERVENTION (PCI-S);  Surgeon: Debby DELENA Sor, MD;  Location: [facility] CATH LAB;  Service: Cardiovascular;;   POLYPECTOMY  06/15/2022   Procedure: POLYPECTOMY;  Surgeon: Stacia Glendia BRAVO, MD;  Location: [facility];  Service: Gastroenterology;;   POSTERIOR CERVICAL FUSION/FORAMINOTOMY  06/20/2007   fusion c3-t1   PULMONARY VEIN ABLATION  06/20/1999   Dr Britta at [facility]   TONSILLECTOMY  06/19/1957   TRANSFORAMINAL LUMBAR INTERBODY FUSION W/ MIS  1 LEVEL Left 12/08/2021   Procedure: LEFT LUMBAR FIVE-SACRAL ONE MINIMALLY INVASIVE (MIS) TRANSFORAMINAL LUMBAR INTERBODY FUSION;  Surgeon: Cheryle Debby DELENA, MD;  Location: [facility];  Service: Neurosurgery;  Laterality: Left;    Medical History: Past Medical History:  Diagnosis Date   Anemia 12/2020   Aortic atherosclerosis (HCC)    Atrial fibrillation (HCC)    a. s/p PVI X2 by Dr Britta at [facility] around 2000.   CAD (coronary artery disease)    a. NSTEMI (06/2013):  LHC (06/27/2013):  pLAD 10-20, RCA 90, EF 45-50%, inf HK.  PCI:  Xience Alpine (3 x 23 mm) DES to the RCA. b. Relook cath 07/10/13 - patent stent. Imdur  added. c. Recurrent CP after that - abnormal nuc 07/2017 with chest pain, dyspnea, hypotensive response. Rest images with mid-basal inferior thinning. No stress images due to abrupt d/c of test. Relook cath 3/12 - no significant r   Chronic back pain greater than 3 months duration    Cirrhosis, non-alcoholic (HCC)    Depression    DJD (degenerative joint disease) of cervical spine    3 herniated discs (09/01/2013)   DJD (degenerative joint disease),  lumbosacral    bulging L5-S1 (09/01/2013)   Dysrhythmia    PVCs. A.fib with two ablations   Herniated cervical disc    c2-T1 fusion   History of kidney stones    turned into urosepsis   HLD (hyperlipidemia)    Ischemic cardiomyopathy    a. Echo (06/28/2011): Inferior and inferoseptal akinesis, EF 40%, grade 2 diastolic dysfunction, MAC, small effusion (no tamponade). B. EF 40% by echo 06/2013, cath 07/2013, 55% by cath 08/2013 with subtle mild mid inferior hypocontractility.   Liver cirrhosis secondary to NASH [facility])    Lumbar herniated disc    Neuromuscular disorder (HCC)    bilateral neuropathy legs   NSTEMI (non-ST elevated myocardial infarction) (HCC) 06/27/2013   Osteoarthritis    neck and lower back (09/01/2013)   Pericardial effusion    a. Chronic since hx of pericarditis in 2004. b. Mod by echo 06/2013, small by echo 06/2013.    Restless leg syndrome    Type II diabetes mellitus (HCC)     Family History: Family History  Problem Relation Age of Onset   Ovarian cancer Mother    Arthritis Mother    CAD Father    Liver disease Father    Diabetes Father    Arthritis Father    COPD Father    Arthritis Sister    Dementia Sister    Stroke Maternal Grandmother    Sudden death Maternal Grandfather    Heart attack Maternal Grandfather    ALS Paternal Grandmother    Cancer Paternal Grandfather        unknown   Arrhythmia Neg Hx     Social History: Social History   Socioeconomic History   Marital status: Married    Spouse name: Not on file   Number of children: 0   Years of education: Not on file   Highest education level: Not on file  Occupational History   Occupation: RESP/retired    Employer: [employer]    Comment: Theatre manager at [employer]  Tobacco Use   Smoking status: Former    Current packs/day: 0.00    Average packs/day: 1.5 packs/day for 43.0 years (64.5 ttl pk-yrs)    Types: Cigarettes    Start date: 05/05/1964    Quit date: 05/06/2007    Years since quitting: 16.7   Smokeless tobacco: Never  Vaping Use   Vaping status: Never Used  Substance and Sexual Activity   Alcohol use: Not Currently    Comment: once monthly/rarely   Drug use: No   Sexual activity: Not Currently  Other Topics Concern   Not on file  Social History Narrative   Are you right handed or left handed? right   Are you currently employed ?    What is your current occupation? retired   Do you live at home alone?   Who lives with you? wife   What type of home do you live in: 1 story or 2 story? one    Caffiene all day long 6 soda   Social Drivers of Corporate investment banker Strain: Low Risk  (01/01/2024)   Received from [facility]   Overall Financial Resource Strain (CARDIA)    Difficulty of Paying Living Expenses: Not hard at all  Food Insecurity: No Food  Insecurity (01/01/2024)   Received from [facility]   Hunger Vital Sign    Within the past 12 months, you worried that your food would run out before  you got the money to buy more.: Never true    Within the past 12 months, the food you bought just didn't last and you didn't have money to get more.: Never true  Transportation Needs: No Transportation Needs (01/01/2024)   Received from [facility] - Transportation    In the past 12 months, has lack of transportation kept you from medical appointments or from getting medications?: No    Lack of Transportation (Non-Medical): No  Physical Activity: Sufficiently Active (08/22/2020)   Received from [facility]   Exercise Vital Sign  Stress: Stress Concern Present (08/22/2020)   Received from [facility] - Occupational Stress Questionnaire  Social Connections: Not on file  Intimate Partner Violence: Not on file    Vital Signs: Blood pressure 108/73, pulse 72, temperature 98.4 F (36.9 C), resp. rate 16, height 5' 7 (1.702 m), weight 174 lb (78.9 kg), SpO2 99%.  Examination: General Appearance: The patient is well-developed, well-nourished, and in no distress. Skin: Gross inspection of skin unremarkable. Head: normocephalic, no gross deformities. Eyes: no gross deformities noted. ENT: ears appear grossly normal no exudates. Neck: Supple. No thyromegaly. No LAD. Respiratory: no rhonchi noted. Cardiovascular: Normal S1 and S2 without murmur or rub. Extremities: No cyanosis. pulses are equal. Neurologic: Alert and oriented. No involuntary movements.  LABS: Recent Results (from the past 2160 hours)  Vitamin B1     Status: None   Collection Time: 11/30/23  9:24 AM  Result Value Ref Range   Vitamin B1 (Thiamine ) 13 8 - 30 nmol/L    Comment: (Note) Vitamin supplementation within 24 hours prior to blood  draw may affect  the accuracy of the results. . This test was developed and its analytical performance  characteristics have been determined by [employer]. It has not been cleared or approved by FDA.  This assay has been validated pursuant to the CLIA  regulations and is used for clinical purposes. . [facility] [address] [place] [address] [phone] Johanna Agent L. Gino, MD, PhD   Vitamin B12     Status: None   Collection Time: 11/30/23  9:24 AM  Result Value Ref Range   Vitamin B-12 263 200 - 1,100 pg/mL    Comment: . Please Note: Although the reference range for vitamin B12 is [phone] pg/mL, it has been reported that between 5 and 10% of patients with values between 200 and 400 pg/mL may experience neuropsychiatric and hematologic abnormalities due to occult B12 deficiency; less than 1% of patients with values above 400 pg/mL will have symptoms. .   Immunofixation electrophoresis     Status: None   Collection Time: 11/30/23  9:24 AM  Result Value Ref Range   Immunofix Electr Int      Comment: . Normal pattern. No monoclonal proteins detected. .    Immunoglobulin A 120 70 - 320 mg/dL   IgG (Immunoglobin G), Serum 924 600 - 1,540 mg/dL   IgM, Serum 67 50 - 699 mg/dL  CBC with Differential/Platelet     Status: Abnormal   Collection Time: 12/14/23  4:17 PM  Result Value Ref Range   WBC 11.5 (H) 3.4 - 10.8 x10E3/uL   RBC 3.74 (L) 4.14 - 5.80 x10E6/uL   Hemoglobin 11.8 (L) 13.0 - 17.7 g/dL   Hematocrit 64.2 (L) 62.4 - 51.0 %   MCV 96 79 - 97 fL  MCH 31.6 26.6 - 33.0 pg   MCHC 33.1 31.5 - 35.7 g/dL   RDW 86.1 88.3 - 84.5 %   Platelets 272 150 - 450 x10E3/uL   Neutrophils 90 Not Estab. %   Lymphs 7 Not Estab. %   Monocytes 2 Not Estab. %   Eos 0 Not Estab. %   Basos 0 Not Estab. %   Neutrophils Absolute 10.4 (H) 1.4 - 7.0 x10E3/uL   Lymphocytes Absolute 0.8 0.7 - 3.1 x10E3/uL   Monocytes Absolute 0.3 0.1 - 0.9 x10E3/uL   EOS  (ABSOLUTE) 0.0 0.0 - 0.4 x10E3/uL   Basophils Absolute 0.0 0.0 - 0.2 x10E3/uL   Immature Granulocytes 1 Not Estab. %   Immature Grans (Abs) 0.1 0.0 - 0.1 x10E3/uL  CMP14 + Anion Gap     Status: Abnormal   Collection Time: 12/14/23  4:17 PM  Result Value Ref Range   Glucose 220 (H) 70 - 99 mg/dL   BUN 24 8 - 27 mg/dL   Creatinine, Ser 8.78 0.76 - 1.27 mg/dL   eGFR 64 >40 fO/fpw/8.26   BUN/Creatinine Ratio 20 10 - 24   Sodium 133 (L) 134 - 144 mmol/L   Potassium 4.6 3.5 - 5.2 mmol/L   Chloride 95 (L) 96 - 106 mmol/L   CO2 22 20 - 29 mmol/L   Anion Gap 16.0 10.0 - 18.0 mmol/L   Calcium  9.7 8.6 - 10.2 mg/dL   Total Protein 6.6 6.0 - 8.5 g/dL   Albumin 4.2 3.9 - 4.9 g/dL   Globulin, Total 2.4 1.5 - 4.5 g/dL   Bilirubin Total 1.2 0.0 - 1.2 mg/dL   Alkaline Phosphatase 135 (H) 44 - 121 IU/L   AST 27 0 - 40 IU/L   ALT 31 0 - 44 IU/L  Magnesium      Status: None   Collection Time: 12/14/23  4:17 PM  Result Value Ref Range   Magnesium  1.7 1.6 - 2.3 mg/dL  RFE85 + Anion Gap     Status: Abnormal   Collection Time: 01/02/24  2:28 PM  Result Value Ref Range   Glucose 331 (H) 70 - 99 mg/dL   BUN 28 (H) 8 - 27 mg/dL   Creatinine, Ser 8.78 0.76 - 1.27 mg/dL   eGFR 64 >40 fO/fpw/8.26   BUN/Creatinine Ratio 23 10 - 24   Sodium 137 134 - 144 mmol/L   Potassium 4.9 3.5 - 5.2 mmol/L   Chloride 98 96 - 106 mmol/L   CO2 24 20 - 29 mmol/L   Anion Gap 15.0 10.0 - 18.0 mmol/L   Calcium  8.9 8.6 - 10.2 mg/dL   Total Protein 6.0 6.0 - 8.5 g/dL   Albumin 4.0 3.8 - 4.8 g/dL   Globulin, Total 2.0 1.5 - 4.5 g/dL   Bilirubin Total 0.4 0.0 - 1.2 mg/dL   Alkaline Phosphatase 86 44 - 121 IU/L   AST 18 0 - 40 IU/L   ALT 23 0 - 44 IU/L  Hemoglobin A1c     Status: Abnormal   Collection Time: 01/02/24  2:28 PM  Result Value Ref Range   Hgb A1c MFr Bld 9.9 (H) 4.8 - 5.6 %    Comment:          Prediabetes: 5.7 - 6.4          Diabetes: >6.4          Glycemic control for adults with diabetes: <7.0    Est.  average glucose Bld gHb Est-mCnc 237 mg/dL  Lipid panel     Status: None   Collection Time: 01/02/24  2:28 PM  Result Value Ref Range   Cholesterol, Total 125 100 - 199 mg/dL   Triglycerides 887 0 - 149 mg/dL   HDL 60 >60 mg/dL   VLDL Cholesterol Cal 20 5 - 40 mg/dL   LDL Chol Calc (NIH) 45 0 - 99 mg/dL   Chol/HDL Ratio 2.1 0.0 - 5.0 ratio    Comment:                                   T. Chol/HDL Ratio                                             Men  Women                               1/2 Avg.Risk  3.4    3.3                                   Avg.Risk  5.0    4.4                                2X Avg.Risk  9.6    7.1                                3X Avg.Risk 23.4   11.0   Magnesium      Status: None   Collection Time: 01/02/24  2:28 PM  Result Value Ref Range   Magnesium  2.1 1.6 - 2.3 mg/dL  TSH     Status: None   Collection Time: 01/02/24  2:28 PM  Result Value Ref Range   TSH 0.846 0.450 - 4.500 uIU/mL  CK, total     Status: None   Collection Time: 01/02/24  2:28 PM  Result Value Ref Range   Total CK 99 41 - 331 U/L    Radiology: CT Chest High Resolution Result Date: 12/28/2023 CLINICAL DATA:  Chronic lung disease, shortness of breath EXAM: CT CHEST WITHOUT CONTRAST TECHNIQUE: Multidetector CT imaging of the chest was performed following the standard protocol without intravenous contrast. High resolution imaging of the lungs, as well as inspiratory and expiratory imaging, was performed. RADIATION DOSE REDUCTION: This exam was performed according to the departmental dose-optimization program which includes automated exposure control, adjustment of the mA and/or kV according to patient size and/or use of iterative reconstruction technique. COMPARISON:  12/13/2021 FINDINGS: Cardiovascular: Aortic atherosclerosis. Normal heart size. Three-vessel coronary artery calcifications. Unchanged small, chronic pericardial effusion. Mediastinum/Nodes: No enlarged mediastinal, hilar, or axillary  lymph nodes. Thyroid  gland, trachea, and esophagus demonstrate no significant findings. Lungs/Pleura: Small right pleural effusion and associated atelectasis or consolidation. Minimal paraseptal emphysema. Diffuse bilateral bronchial wall thickening. No evidence of fibrotic interstitial lung disease. Upper Abdomen: No acute abnormality.  Coarse contour of the liver. Musculoskeletal: No chest wall abnormality. No acute osseous findings. IMPRESSION: 1. Small right pleural effusion and associated atelectasis or consolidation. 2. Minimal paraseptal emphysema. 3. Diffuse bilateral bronchial wall  thickening, consistent with nonspecific infectious or inflammatory bronchitis. 4. No evidence of fibrotic interstitial lung disease. 5. Coarse contour of the liver, suggestive of cirrhosis. 6. Coronary artery disease. Aortic Atherosclerosis (ICD10-I70.0) and Emphysema (ICD10-J43.9). Electronically Signed   By: Marolyn JONETTA Jaksch M.D.   On: 12/28/2023 07:03   DG UGI W DOUBLE CM (HD BA) Result Date: 12/27/2023 CLINICAL DATA:  73 year old male with a history of chronic cough and dysphagia. States that intermittently for the past several months, liquids will get 'strangled' when he tries to swallow; when this happens, he ends up regurgitating the liquid. He denies any difficulty swallowing solid foods. EXAM: UPPER GI SERIES WITH HIGH DENSITY WITHOUT KUB TECHNIQUE: Combined double and single contrast examination was performed using effervescent crystals, high-density barium and thin liquid barium. This exam was performed by Sherrilee Bal, PA-C, and was supervised and interpreted by Manford Cummins, MD. FLUOROSCOPY: Radiation Exposure Index (as provided by the fluoroscopic device): 59.4 mGy Kerma COMPARISON:  Chest radiograph dated 08/14/2023, DG UGI W SINGLE CM - 04/01/2007. FINDINGS: Esophagus: Normal appearance. No obvious mucosal abnormality or visible ulceration. Esophageal motility: Mild diffuse tertiary contractions. Mild esophageal  stasis of the upper half of the esophagus leading to delayed passage of contrast into the lower half of the esophagus and stomach. Gastroesophageal reflux: None visualized with coughing or Valsalva. Ingested 13mm barium tablet: Passed without difficulty into the stomach. Stomach: Normal appearance. No hiatal hernia. Gastric emptying: Normal. Duodenum: Normal appearance. Other: Hardware from previous cervical fusion appears intact. Implanted loop recorder projects over the left medial hemithorax. IMPRESSION: Mild esophageal dysmotility. Electronically Signed   By: Limin  Xu M.D.   On: 12/27/2023 12:17    No results found.  CT Chest High Resolution Result Date: 12/28/2023 CLINICAL DATA:  Chronic lung disease, shortness of breath EXAM: CT CHEST WITHOUT CONTRAST TECHNIQUE: Multidetector CT imaging of the chest was performed following the standard protocol without intravenous contrast. High resolution imaging of the lungs, as well as inspiratory and expiratory imaging, was performed. RADIATION DOSE REDUCTION: This exam was performed according to the departmental dose-optimization program which includes automated exposure control, adjustment of the mA and/or kV according to patient size and/or use of iterative reconstruction technique. COMPARISON:  12/13/2021 FINDINGS: Cardiovascular: Aortic atherosclerosis. Normal heart size. Three-vessel coronary artery calcifications. Unchanged small, chronic pericardial effusion. Mediastinum/Nodes: No enlarged mediastinal, hilar, or axillary lymph nodes. Thyroid  gland, trachea, and esophagus demonstrate no significant findings. Lungs/Pleura: Small right pleural effusion and associated atelectasis or consolidation. Minimal paraseptal emphysema. Diffuse bilateral bronchial wall thickening. No evidence of fibrotic interstitial lung disease. Upper Abdomen: No acute abnormality.  Coarse contour of the liver. Musculoskeletal: No chest wall abnormality. No acute osseous findings.  IMPRESSION: 1. Small right pleural effusion and associated atelectasis or consolidation. 2. Minimal paraseptal emphysema. 3. Diffuse bilateral bronchial wall thickening, consistent with nonspecific infectious or inflammatory bronchitis. 4. No evidence of fibrotic interstitial lung disease. 5. Coarse contour of the liver, suggestive of cirrhosis. 6. Coronary artery disease. Aortic Atherosclerosis (ICD10-I70.0) and Emphysema (ICD10-J43.9). Electronically Signed   By: Marolyn JONETTA Jaksch M.D.   On: 12/28/2023 07:03   DG UGI W DOUBLE CM (HD BA) Result Date: 12/27/2023 CLINICAL DATA:  73 year old male with a history of chronic cough and dysphagia. States that intermittently for the past several months, liquids will get 'strangled' when he tries to swallow; when this happens, he ends up regurgitating the liquid. He denies any difficulty swallowing solid foods. EXAM: UPPER GI SERIES WITH HIGH DENSITY WITHOUT KUB TECHNIQUE: Combined double  and single contrast examination was performed using effervescent crystals, high-density barium and thin liquid barium. This exam was performed by Sherrilee Bal, PA-C, and was supervised and interpreted by Manford Cummins, MD. FLUOROSCOPY: Radiation Exposure Index (as provided by the fluoroscopic device): 59.4 mGy Kerma COMPARISON:  Chest radiograph dated 08/14/2023, DG UGI W SINGLE CM - 04/01/2007. FINDINGS: Esophagus: Normal appearance. No obvious mucosal abnormality or visible ulceration. Esophageal motility: Mild diffuse tertiary contractions. Mild esophageal stasis of the upper half of the esophagus leading to delayed passage of contrast into the lower half of the esophagus and stomach. Gastroesophageal reflux: None visualized with coughing or Valsalva. Ingested 13mm barium tablet: Passed without difficulty into the stomach. Stomach: Normal appearance. No hiatal hernia. Gastric emptying: Normal. Duodenum: Normal appearance. Other: Hardware from previous cervical fusion appears intact.  Implanted loop recorder projects over the left medial hemithorax. IMPRESSION: Mild esophageal dysmotility. Electronically Signed   By: Limin  Xu M.D.   On: 12/27/2023 12:17    Assessment and Plan: Patient Active Problem List   Diagnosis Date Noted   SOB (shortness of breath) 01/02/2024   Recurrent idiopathic pericarditis 12/14/2023   Metabolic dysfunction-associated steatohepatitis (MASH) 10/03/2023   Altered mental status 08/20/2023   Benign prostatic hyperplasia without lower urinary tract symptoms 07/09/2023   BMI 27.0-27.9,adult 07/09/2023   Type 2 diabetes mellitus with other circulatory complications (HCC) 01/03/2023   OSA (obstructive sleep apnea) 11/22/2022   Wheezing 07/17/2022   Atrial fibrillation (HCC) 07/17/2022   Diabetic polyneuropathy associated with type 2 diabetes mellitus (HCC) 06/30/2022   Aortic atherosclerosis (HCC) 06/30/2022   Herniated disc, cervical 06/30/2022   Lumbar herniated disc 06/30/2022   Vitamin B12 deficiency 06/30/2022   BMI 25.0-25.9,adult 06/30/2022   Iron  (Fe) deficiency anemia 03/13/2022   Iron  deficiency anemia, unspecified 02/23/2022   Nausea and vomiting 12/13/2021   Constipation 12/13/2021   Spondylolisthesis of lumbar region 12/08/2021   Hypokalemia    Hypomagnesemia    Hypercholesterolemia    Acute cephalic vein thrombosis, right    Dyspnea    Atrial fibrillation with RVR (HCC)    Anemia 02/23/2021   Nephrolithiasis 02/11/2021   Syncope 09/18/2019   Encounter for general adult medical examination with abnormal findings 04/30/2018   Muscle spasm 04/30/2018   Dysuria 04/30/2018   Cirrhosis (HCC) 12/17/2015   Pericardial effusion 07/22/2014   Restless legs syndrome 06/26/2014   Other amnesia 06/04/2014   Coronary artery disease involving native coronary artery of native heart without angina pectoris 04/13/2014   Cardiomyopathy, ischemic 04/13/2014   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 OSA (obstructive sleep apnea) (Primary) He may need a another PSG will go ahead and get this addressed and go ahead and get this ordered.  I did speak to his wife and she is in favor of getting another study.  2. Shortness of breath The patient appears to be multifactorial he does have some underlying pulmonary issues but also has underlying cardiac issues and he is working with his cardiologist to get these things addressed with  EP studies.  3. Cardiac tamponade This was taken care of at [facility]  Will continue to monitor for any recurrence of any issues.  General Counseling: I have discussed the findings of the evaluation and examination with Kristopher [name].  I have also discussed any further diagnostic evaluation thatmay be needed or ordered today. Kristopher [name] verbalizes understanding of the findings of todays visit. We also reviewed his medications today and discussed drug interactions and side effects including but not limited excessive drowsiness and altered mental states. We also discussed that there is always a risk not just to him but also people around him. he has been encouraged to call the office with any questions or concerns that should arise related to todays visit.  No orders of the defined types were placed in this encounter.    Time spent: 20  I have personally obtained a history, examined the patient, evaluated laboratory and imaging results, formulated the assessment and plan and placed  orders.    Elfreda DELENA Bathe, MD [facility] Pulmonary and Critical Care Sleep medicine

## 2024-01-23 ENCOUNTER — Other Ambulatory Visit: Payer: Self-pay | Admitting: Internal Medicine

## 2024-01-30 DIAGNOSIS — I480 Paroxysmal atrial fibrillation: Secondary | ICD-10-CM | POA: Diagnosis not present

## 2024-01-30 NOTE — Procedures (Signed)
 [facility] [address] [place], [address]    Complete Pulmonary Function Testing Interpretation:  FINDINGS:  The forced vital capacity is normal.  FEV1 is normal.  FEV1 FVC ratio is mildly decreased.  Postbronchodilator there was no significant improvement in the FEV1 or may still occur total lung capacity is mildly decreased.  Residual volume is normal.  Residual volume total lung capacity ratio was increased.  FRC is normal.  DLCO was normal.  IMPRESSION:  This pulmonary function study is suggestive of mild restrictive lung disease clinical correlation is recommended  Elfreda DELENA Bathe, MD [facility] Pulmonary Critical Care Medicine Sleep Medicine

## 2024-02-07 ENCOUNTER — Telehealth: Payer: Self-pay | Admitting: Internal Medicine

## 2024-02-07 LAB — PULMONARY FUNCTION TEST

## 2024-02-07 NOTE — Telephone Encounter (Signed)
 SS order emailed to [facility]-Toni

## 2024-02-12 DIAGNOSIS — I480 Paroxysmal atrial fibrillation: Secondary | ICD-10-CM | POA: Diagnosis not present

## 2024-02-17 ENCOUNTER — Other Ambulatory Visit: Payer: Self-pay | Admitting: Internal Medicine

## 2024-02-20 DIAGNOSIS — G4730 Sleep apnea, unspecified: Secondary | ICD-10-CM | POA: Diagnosis not present

## 2024-02-20 DIAGNOSIS — R0902 Hypoxemia: Secondary | ICD-10-CM | POA: Diagnosis not present

## 2024-02-22 ENCOUNTER — Other Ambulatory Visit: Payer: Self-pay | Admitting: Internal Medicine

## 2024-02-22 MED ORDER — ESCITALOPRAM OXALATE 20 MG PO TABS
20.0000 mg | ORAL_TABLET | Freq: Every day | ORAL | 1 refills | Status: AC
Start: 2024-02-22 — End: ?

## 2024-02-26 ENCOUNTER — Other Ambulatory Visit: Payer: Self-pay

## 2024-02-26 LAB — PULMONARY FUNCTION TEST

## 2024-02-26 MED ORDER — BUSPIRONE HCL 5 MG PO TABS: 5.0000 mg | ORAL_TABLET | Freq: Two times a day (BID) | ORAL | 2 refills | Status: DC

## 2024-02-27 DIAGNOSIS — I2510 Atherosclerotic heart disease of native coronary artery without angina pectoris: Secondary | ICD-10-CM | POA: Diagnosis not present

## 2024-02-27 DIAGNOSIS — I3139 Other pericardial effusion (noninflammatory): Secondary | ICD-10-CM | POA: Diagnosis not present

## 2024-02-27 DIAGNOSIS — I480 Paroxysmal atrial fibrillation: Secondary | ICD-10-CM | POA: Diagnosis not present

## 2024-03-05 DIAGNOSIS — I314 Cardiac tamponade: Secondary | ICD-10-CM | POA: Diagnosis not present

## 2024-03-05 DIAGNOSIS — I3139 Other pericardial effusion (noninflammatory): Secondary | ICD-10-CM | POA: Diagnosis not present

## 2024-03-06 ENCOUNTER — Telehealth: Payer: Self-pay | Admitting: Internal Medicine

## 2024-03-06 ENCOUNTER — Encounter (INDEPENDENT_AMBULATORY_CARE_PROVIDER_SITE_OTHER): Admitting: Internal Medicine

## 2024-03-06 DIAGNOSIS — G4733 Obstructive sleep apnea (adult) (pediatric): Secondary | ICD-10-CM | POA: Diagnosis not present

## 2024-03-06 NOTE — Telephone Encounter (Signed)
 Cpap Titration scheduled for 03/06/24 @ [facility]

## 2024-03-10 ENCOUNTER — Telehealth: Payer: Self-pay

## 2024-03-10 DIAGNOSIS — G4733 Obstructive sleep apnea (adult) (pediatric): Secondary | ICD-10-CM

## 2024-03-10 DIAGNOSIS — G4734 Idiopathic sleep related nonobstructive alveolar hypoventilation: Secondary | ICD-10-CM

## 2024-03-11 ENCOUNTER — Telehealth: Payer: Self-pay

## 2024-03-11 DIAGNOSIS — Z9889 Other specified postprocedural states: Secondary | ICD-10-CM | POA: Diagnosis not present

## 2024-03-11 DIAGNOSIS — K7460 Unspecified cirrhosis of liver: Secondary | ICD-10-CM | POA: Diagnosis not present

## 2024-03-11 DIAGNOSIS — I318 Other specified diseases of pericardium: Secondary | ICD-10-CM | POA: Diagnosis not present

## 2024-03-11 DIAGNOSIS — I480 Paroxysmal atrial fibrillation: Secondary | ICD-10-CM | POA: Diagnosis not present

## 2024-03-11 DIAGNOSIS — I3139 Other pericardial effusion (noninflammatory): Secondary | ICD-10-CM | POA: Diagnosis not present

## 2024-03-11 DIAGNOSIS — I2510 Atherosclerotic heart disease of native coronary artery without angina pectoris: Secondary | ICD-10-CM | POA: Diagnosis not present

## 2024-03-11 DIAGNOSIS — G4733 Obstructive sleep apnea (adult) (pediatric): Secondary | ICD-10-CM | POA: Diagnosis not present

## 2024-03-11 NOTE — Telephone Encounter (Addendum)
 Contacted Mitch from [employer] to notify him that AA has placed O2 order for patient's nocturnal oxygen use. Also notified patient too.

## 2024-03-12 ENCOUNTER — Telehealth: Payer: Self-pay | Admitting: Internal Medicine

## 2024-03-12 NOTE — Telephone Encounter (Signed)
 Done

## 2024-03-12 NOTE — Telephone Encounter (Signed)
 Received call from Avelina w/ [employer] needing SS for nocturnal O2. After s/w Melecia & [facility], I notified Avelina that cpap titration was done 03/06/24. Once DSK signs off, I will email her the results-Toni

## 2024-03-13 ENCOUNTER — Telehealth: Payer: Self-pay | Admitting: Internal Medicine

## 2024-03-13 NOTE — Telephone Encounter (Signed)
 09/182025 titration emailed to Avelina w/ [facility]-Toni

## 2024-03-14 ENCOUNTER — Telehealth: Payer: Self-pay | Admitting: Internal Medicine

## 2024-03-14 ENCOUNTER — Other Ambulatory Visit: Payer: Self-pay | Admitting: Nurse Practitioner

## 2024-03-14 ENCOUNTER — Telehealth: Payer: Self-pay

## 2024-03-14 DIAGNOSIS — G4733 Obstructive sleep apnea (adult) (pediatric): Secondary | ICD-10-CM

## 2024-03-14 NOTE — Telephone Encounter (Signed)
 Notified Thomasina Adine Hawks & Avelina with [employer] [facility] of pressure change order-Toni

## 2024-03-14 NOTE — Telephone Encounter (Signed)
 [facility] sent message that pt sleep study showed pt is not qualified for Oxygen and sent message to [facility] to make him appt with DSK

## 2024-03-17 ENCOUNTER — Ambulatory Visit: Payer: Medicare HMO | Admitting: Internal Medicine

## 2024-03-17 ENCOUNTER — Telehealth: Payer: Self-pay | Admitting: Internal Medicine

## 2024-03-17 DIAGNOSIS — I314 Cardiac tamponade: Secondary | ICD-10-CM | POA: Diagnosis not present

## 2024-03-17 DIAGNOSIS — I3139 Other pericardial effusion (noninflammatory): Secondary | ICD-10-CM | POA: Diagnosis not present

## 2024-03-17 NOTE — Telephone Encounter (Signed)
 Lvm notifying patient that per titration, he does not qualify for nocturnal O2, but DSK has done order to change pressure on his device, and I have contacted [facility]-Toni

## 2024-03-17 NOTE — Procedures (Signed)
 [facility]  Polysomnogram Report Part I  Phone: [phone] Fax: [phone]  Patient Name: Kristopher [name], Kristopher [name]. Acquisition Number: [id number]  Date of Birth: [birth year] Acquisition Date: 03/06/2024  Referring Physician: Elfreda RONAL Bathe, MD     History: The patient is a 73 year old  . Medical History: anemia, aortic atherosclerosis, atrial fibrillation, CAD, chronic back pain, non alcoholic cirrhosis, depression, DJD of cervical spine, DJD of lumbosacral, dysrhythmia, herniated cervical disc, kidney stones, hyperlipidemia, ischemic cardiomyopathy, liver cirrhosis secondary to NASH, neuromuscular disorder, NSTEMI, osteoarthritis, pericardial effusion, restless leg syndrome, type II diabetes melitus  Medications: Lipitor , Lioresal , Buspar , Cardizem , Jardiance, Lexapro , Neurontin , Humalog KwikPen, Lantus , Glucophage -XR, pumpkin seed oil, Protonix , Phenergan , Requip .  Procedure: This routine overnight polysomnogram was performed on the Alice 5 using the standard CPAP protocol. This included 6 channels of EEG, 2 channels of EOG, chin EMG, bilateral anterior tibialis EMG, nasal/oral thermistor, PTAF (nasal pressure transducer), chest and abdominal wall movements, EKG, and pulse oximetry.  Description: The total recording time was 331.5 minutes. The total sleep time was 312.0 minutes. There were a total of 19.5 minutes of wakefulness after sleep onset for a goodsleep efficiency of 94.1%. The latency to sleep onset was shortat 0.0 minutes. The R sleep onset latency was within normal limits at 80.0 minutes. Sleep parameters, as a percentage of the total sleep time, demonstrated 7.2% of sleep was in N1 sleep, 52.7% N2, 7.7% N3 and 32.4% R sleep. There were a total of 297 arousals for an arousal index of 57.1 arousals per hour of sleep that was elevated.  Overall, there were a total of 48 respiratory events for a respiratory disturbance index, which includes apneas, hypopneas and RERAs (increased  respiratory effort) of 9.2 respiratory events per hour of sleep during the pressure titration.  was initiated at 4 cm H2O at lights out, 11:57 p.m. It was titrated in 2 cm increments for hypopneas to 8 cm H2O. The apnea was controlled at this pressure in non-supine REM.  The pressure was further titrated for supine events. Supine, REM was not observed until the final pressure of 16 cm H2O. Many of the respiratory events appeared to be due to the frequent limb movements.  Additionally, the baseline oxygen saturation during wakefulness was 95%, during NREM sleep averaged 95%, and during REM sleep averaged 94%. The total duration of oxygen < 90% was 9.2 minutes and <80% was 0.3 minutes.  Cardiac monitoring-  demonstrate transient cardiac decelerations associated with the apneas.  significant cardiac rhythm irregularities.   Periodic limb movement monitoring- demonstrated that there were 223 periodic limb movements for a periodic limb movement index of 42.9 periodic limb movements per hour of sleep. Quasi-periodic limb movements were observed during periods of wakefulness.  Impression: This patient's obstructive sleep apnea demonstrated significant improvement with the utilization of nasal  at 8 cm H2O in non-supine sleep. Supine, REM was not observed until the pressure of 16 cm H2O. Many of the respiratory events appeared to be due to the frequent limb movements.  Despite the use of Neurontin  and Requip , there was a significantly elevated periodic limb movement index of 42.9 periodic limb movements per hour of sleep. In addition, quasi-periodic limb movements were observed during periods of wakefulness. Improvement in control of the limb movements may improve sleep quality and control of the sleep apnea.  Recommendations: Would recommend utilization of auto-adjusting  at 8-16 cm H2O.      An AirTouch F20 mask, size medium, was used. Chin strap  used during study- no. Humidifier used during study- heated.      Elfreda RONAL Bathe, MD, [facility] Diplomate ABMS-Pulmonary, Critical Care and Sleep Medicine  Electronically reviewed and digitally signed   [facility] CPAP/BIPAP Polysomnogram Report Part II Phone: [phone] Fax: [phone]  Patient last name [place] Size 16.0 in. Acquisition [phone]  Patient first name Kristopher [name]. Weight 174.0 lbs. Started 03/06/2024 at 11:53:36 PM  Birth date Oct 07, 1950 Height 67.0 in. Stopped 03/07/2024 at 5:35:06 AM  Age 6      Type Adult BMI 27.2 lb/in2 Duration 331.5  Raford Sprang, RPSGT / Harlene Kemp  Reviewed by: Kathe G. Henke, PhD, ABSM, FAASM Sleep Data: Lights Out: 11:57:06 PM Sleep Onset: 11:57:06 PM  Lights On: 5:28:36 AM Sleep Efficiency: 94.1 %  Total Recording Time: 331.5 min Sleep Latency (from Lights Off) 0.0 min  Total Sleep Time (TST): 312.0 min R Latency (from Sleep Onset): 80.0 min  Sleep Period Time: 331.0 min Total number of awakenings: 17  Wake during sleep: 19.0 min Wake After Sleep Onset (WASO): 19.5 min   Sleep Data:         Arousal Summary: Stage  Latency from lights out (min) Latency from sleep onset (min) Duration (min) % Total Sleep Time  Normal values  N 1 0.0 0.0 22.5 7.2 (5%)  N 2 0.5 0.5 164.5 52.7 (50%)  N 3 1.0 1.0 24.0 7.7 (20%)  R 80.0 80.0 101.0 32.4 (25%)   Number Index  Spontaneous 182 35.0  Apneas & Hypopneas 26 5.0  RERAs 0 0.0       (Apneas & Hypopneas & RERAs)  (26) (5.0)  Limb Movement 94 18.1  Snore 0 0.0  TOTAL 302 58.1     Respiratory Data:  CA OA MA Apnea Hypopnea* A+ H RERA Total  Number 9 0 0 9 39 48 0 48  Mean Dur (sec) 18.3 0.0 0.0 18.3 21.5 20.9 0.0 20.9  Max Dur (sec) 24.0 0.0 0.0 24.0 36.5 36.5 0.0 36.5  Total Dur (min) 2.8 0.0 0.0 2.8 14.0 16.7 0.0 16.7  % of TST 0.9 0.0 0.0 0.9 4.5 5.4 0.0 5.4  Index (#/h TST) 1.7 0.0 0.0 1.7 7.5 9.2 0.0 9.2  *Hypopneas scored based on 4% or greater desaturation.  Sleep Stage:         REM NREM TST  AHI 0.0 13.4 9.2  RDI 0.0  13.4 9.2   Sleep (min) TST (%) REM (min) NREM (min) CA (#) OA (#) MA (#) HYP (#) AHI (#/h) RERA (#) RDI (#/h) Desat (#)  Supine 209.0 66.99 76.5 132.5 8 0 0 11 5.5 0 5.5 108  Non-Supine 103.00 33.01 24.50 78.50 1.00 0.00 0.00 28.00 16.89 0 16.89 47.00  Left: 103.0 33.01 24.5 78.5 1 0 0 28 16.9 0 16.9 47     Snoring: Total number of snoring episodes  0  Total time with snoring    min (   % of sleep)   Oximetry Distribution:             WK REM NREM TOTAL  Average (%)   95 94 95 95  < 90% 0.9 0.0 8.3 9.2  < 80% 0.3 0.0 0.0 0.3  < 70% 0.1 0.0 0.0 0.1  # of Desaturations* 9 11 135 155  Desat Index (#/hour) 34.0 6.5 42.6 31.9  Desat Max (%) 7 7 20 20   Desat Max Dur (sec) 36.0 34.0 63.0 63.0  Approx Min O2 during  sleep 80  Approx min O2 during a respiratory event 56  Was Oxygen added (Y/N) and final rate :    LPM  *Desaturations based on 3% or greater drop from baseline.   Cheyne Stokes Breathing: None Present  Heart Rate Summary:  Average Heart Rate During Sleep 76.0 bpm      Highest Heart Rate During Sleep (95th %) 90.0 bpm      Highest Heart Rate During Sleep 228 bpm (artifact)  Highest Heart Rate During Recording (TIB) 228 bpm (artifact)   Heart Rate Observations: Event Type # Events   Bradycardia 0 Lowest HR Scored: N/A  Sinus Tachycardia During Sleep 0 Highest HR Scored: N/A  Narrow Complex Tachycardia 0 Highest HR Scored: N/A  Wide Complex Tachycardia 0 Highest HR Scored: N/A  Asystole 0 Longest Pause: N/A  Atrial Fibrillation 0 Duration Longest Event: N/A  Other Arrythmias   Type:   Periodic Limb Movement Data: (Primary legs unless otherwise noted) Total # Limb Movement 261 Limb Movement Index 50.2  Total # PLMS 223 PLMS Index 42.9  Total # PLMS Arousals 75 PLMS Arousal Index 14.4  Percentage Sleep Time with PLMS 130.80min (41.7 % sleep)  Mean Duration limb movements (secs) 300.5    IPAP Level (cmH2O) EPAP Level (cmH2O) Total Duration (min) Sleep  Duration (min) Sleep (%) REM (%) CA  #) OA # MA # HYP #) AHI (#/hr) RERAs # RERAs (#/hr) RDI (#/hr)  6 6 27.6 27.6 100.0 0.0 1 0 0 22 50.0 0 0.0 50.0  8 8 60.6 54.6 90.1 14.2 0 0 0 6 6.6 0 0.0 6.6  10 10  73.9 69.4 93.9 21.1 4 0 0 11 13.0 0 0.0 13.0  12 12 38.6 35.6 92.2 0.0 4 0 0 0 6.7 0 0.0 6.7  14 14  54.0 49.5 91.7 4.8 0 0 0 0 0.0 0 0.0 0.0  16 16 74.6 73.6 98.7 98.7 0 0 0 0 0.0 0 0.0 0.0

## 2024-03-18 DIAGNOSIS — Z9889 Other specified postprocedural states: Secondary | ICD-10-CM | POA: Diagnosis not present

## 2024-03-18 DIAGNOSIS — Z01818 Encounter for other preprocedural examination: Secondary | ICD-10-CM | POA: Diagnosis not present

## 2024-03-18 DIAGNOSIS — Z7984 Long term (current) use of oral hypoglycemic drugs: Secondary | ICD-10-CM | POA: Diagnosis not present

## 2024-03-18 DIAGNOSIS — G4733 Obstructive sleep apnea (adult) (pediatric): Secondary | ICD-10-CM | POA: Diagnosis not present

## 2024-03-18 DIAGNOSIS — Z794 Long term (current) use of insulin: Secondary | ICD-10-CM | POA: Diagnosis not present

## 2024-03-18 DIAGNOSIS — I318 Other specified diseases of pericardium: Secondary | ICD-10-CM | POA: Diagnosis not present

## 2024-03-18 DIAGNOSIS — Z189 Retained foreign body fragments, unspecified material: Secondary | ICD-10-CM | POA: Diagnosis not present

## 2024-03-18 DIAGNOSIS — I2510 Atherosclerotic heart disease of native coronary artery without angina pectoris: Secondary | ICD-10-CM | POA: Diagnosis not present

## 2024-03-18 DIAGNOSIS — I255 Ischemic cardiomyopathy: Secondary | ICD-10-CM | POA: Diagnosis not present

## 2024-03-18 DIAGNOSIS — I480 Paroxysmal atrial fibrillation: Secondary | ICD-10-CM | POA: Diagnosis not present

## 2024-03-18 DIAGNOSIS — E119 Type 2 diabetes mellitus without complications: Secondary | ICD-10-CM | POA: Diagnosis not present

## 2024-03-19 ENCOUNTER — Telehealth: Payer: Self-pay | Admitting: Internal Medicine

## 2024-03-19 DIAGNOSIS — Z9889 Other specified postprocedural states: Secondary | ICD-10-CM | POA: Diagnosis not present

## 2024-03-19 DIAGNOSIS — G4733 Obstructive sleep apnea (adult) (pediatric): Secondary | ICD-10-CM | POA: Diagnosis not present

## 2024-03-19 DIAGNOSIS — Z01810 Encounter for preprocedural cardiovascular examination: Secondary | ICD-10-CM | POA: Diagnosis not present

## 2024-03-19 DIAGNOSIS — J90 Pleural effusion, not elsewhere classified: Secondary | ICD-10-CM | POA: Diagnosis not present

## 2024-03-19 DIAGNOSIS — I480 Paroxysmal atrial fibrillation: Secondary | ICD-10-CM | POA: Diagnosis not present

## 2024-03-19 DIAGNOSIS — I255 Ischemic cardiomyopathy: Secondary | ICD-10-CM | POA: Diagnosis not present

## 2024-03-19 DIAGNOSIS — R161 Splenomegaly, not elsewhere classified: Secondary | ICD-10-CM | POA: Diagnosis not present

## 2024-03-19 DIAGNOSIS — J811 Chronic pulmonary edema: Secondary | ICD-10-CM | POA: Diagnosis not present

## 2024-03-19 DIAGNOSIS — I517 Cardiomegaly: Secondary | ICD-10-CM | POA: Diagnosis not present

## 2024-03-19 DIAGNOSIS — I3139 Other pericardial effusion (noninflammatory): Secondary | ICD-10-CM | POA: Diagnosis not present

## 2024-03-19 NOTE — Telephone Encounter (Addendum)
 Patient's wife stating patient is sob while reclining in chair, but no issues when he is up or asleep. DSK explained to her that his cpap titration did not indicate any O2 desaturations Per [employer] [facility], there is no way to do a 24 hour monitor for his O2. For continuous O2, patient will need to do 6 minute walk. 6 minute walk performed in office on 12/04/23 did not qualify patient. She stated patient is scheduled for ablation, and hoping O2 will be documented-Toni   S/w Mitch w/ [employer]. He stated pressure change has not been done yet. I told him they were notified 03/14/24 to do pressure change. He will make sure it gets done and let me know-Toni

## 2024-03-20 DIAGNOSIS — R9431 Abnormal electrocardiogram [ECG] [EKG]: Secondary | ICD-10-CM | POA: Diagnosis not present

## 2024-03-20 DIAGNOSIS — I517 Cardiomegaly: Secondary | ICD-10-CM | POA: Diagnosis not present

## 2024-03-20 DIAGNOSIS — E119 Type 2 diabetes mellitus without complications: Secondary | ICD-10-CM | POA: Diagnosis not present

## 2024-03-20 DIAGNOSIS — I480 Paroxysmal atrial fibrillation: Secondary | ICD-10-CM | POA: Diagnosis not present

## 2024-03-20 DIAGNOSIS — I2510 Atherosclerotic heart disease of native coronary artery without angina pectoris: Secondary | ICD-10-CM | POA: Diagnosis not present

## 2024-03-20 DIAGNOSIS — I318 Other specified diseases of pericardium: Secondary | ICD-10-CM | POA: Diagnosis not present

## 2024-03-20 DIAGNOSIS — I314 Cardiac tamponade: Secondary | ICD-10-CM | POA: Diagnosis not present

## 2024-03-20 DIAGNOSIS — I252 Old myocardial infarction: Secondary | ICD-10-CM | POA: Diagnosis not present

## 2024-03-20 DIAGNOSIS — G4733 Obstructive sleep apnea (adult) (pediatric): Secondary | ICD-10-CM | POA: Diagnosis not present

## 2024-03-20 DIAGNOSIS — Z87891 Personal history of nicotine dependence: Secondary | ICD-10-CM | POA: Diagnosis not present

## 2024-03-20 DIAGNOSIS — I4819 Other persistent atrial fibrillation: Secondary | ICD-10-CM | POA: Diagnosis not present

## 2024-03-20 DIAGNOSIS — G4730 Sleep apnea, unspecified: Secondary | ICD-10-CM | POA: Diagnosis not present

## 2024-03-21 DIAGNOSIS — Z87891 Personal history of nicotine dependence: Secondary | ICD-10-CM | POA: Diagnosis not present

## 2024-03-21 DIAGNOSIS — E119 Type 2 diabetes mellitus without complications: Secondary | ICD-10-CM | POA: Diagnosis not present

## 2024-03-21 DIAGNOSIS — I517 Cardiomegaly: Secondary | ICD-10-CM | POA: Diagnosis not present

## 2024-03-21 DIAGNOSIS — I480 Paroxysmal atrial fibrillation: Secondary | ICD-10-CM | POA: Diagnosis not present

## 2024-03-21 DIAGNOSIS — G4733 Obstructive sleep apnea (adult) (pediatric): Secondary | ICD-10-CM | POA: Diagnosis not present

## 2024-03-21 DIAGNOSIS — R9431 Abnormal electrocardiogram [ECG] [EKG]: Secondary | ICD-10-CM | POA: Diagnosis not present

## 2024-03-21 DIAGNOSIS — I314 Cardiac tamponade: Secondary | ICD-10-CM | POA: Diagnosis not present

## 2024-03-21 DIAGNOSIS — I252 Old myocardial infarction: Secondary | ICD-10-CM | POA: Diagnosis not present

## 2024-04-04 ENCOUNTER — Encounter: Payer: Self-pay | Admitting: Internal Medicine

## 2024-04-04 ENCOUNTER — Ambulatory Visit: Admitting: Internal Medicine

## 2024-04-04 VITALS — BP 120/78 | HR 98 | Temp 97.0°F | Resp 16 | Ht 67.0 in | Wt 174.2 lb

## 2024-04-04 DIAGNOSIS — Z23 Encounter for immunization: Secondary | ICD-10-CM | POA: Diagnosis not present

## 2024-04-04 DIAGNOSIS — E1159 Type 2 diabetes mellitus with other circulatory complications: Secondary | ICD-10-CM

## 2024-04-04 DIAGNOSIS — I480 Paroxysmal atrial fibrillation: Secondary | ICD-10-CM | POA: Diagnosis not present

## 2024-04-04 MED ORDER — BUSPIRONE HCL 5 MG PO TABS: 5.0000 mg | ORAL_TABLET | Freq: Two times a day (BID) | ORAL | 2 refills | Status: DC

## 2024-04-04 MED ORDER — ESCITALOPRAM OXALATE 20 MG PO TABS: 20.0000 mg | ORAL_TABLET | Freq: Every day | ORAL | 1 refills | Status: AC

## 2024-04-04 NOTE — Assessment & Plan Note (Signed)
 His blood sugars have come back down to normal since coming off the prednisone .  We will check his HgBa1c today.

## 2024-04-04 NOTE — Assessment & Plan Note (Signed)
 He is in sinus rhythm today on my exam.  He is s/p ablaation and we will continue with rate control on cardizem  SR.  He takes magnesium  supplements at this time and we will recheck his electrolytes.  He will remain on anticoagulation at this time.

## 2024-04-04 NOTE — Addendum Note (Signed)
 Addended by: LENETTA LACKS on: 04/04/2024 04:36 PM   Modules accepted: Orders

## 2024-04-04 NOTE — Progress Notes (Signed)
 Office Visit  Subjective   Patient ID: Kristopher [name]   DOB: 1950/10/30   Age: 73 y.o.   MRN: [id number]   Chief Complaint Chief Complaint  Patient presents with   Follow-up    3 Month follow up     History of Present Illness The patient is a 73 year old Caucasian/White male who returns for a follow-up visit for his T2 diabetes. This past year his HgBA1c was elevated and I wanted to start him on ozempic but he wanted to hold off and talk to his endocrinologist first.  They wanted him to remain on metformin  and Actos .  His last visit with endocrinology was a telemedicine visit in 06/2023 and they did not change his medications.  He was diagnosed with T2 diabetes around 2000.  On his last visit, his HgBa1c was 9.9%.  He did see endo on 7/172/2025 and they put him on a tapering dose of lantus  and SSI depending on his does of prednisone .  Currently he is on:  Lantus  10 Units daily, humalog SSI 2-10 Units qAC, metformin  1000mg  BID and jardiance 25mg  daily  I tried him on farxiga 5mg  daily in the past but he could not afford this.  He is back to exercising by walking 3 miles per day.  He specifically denies unexplained abdominal pain, or documented hypoglycemia. He checks blood sugars qAC 120-140.  His last HgBA1c was done 3 months ago and was 9.9%.  He came in fasting today in anticipation of lab work. He does have complications of possible diabetic neuropathy involving his feet.  However he has had a laminectomy in the past which could contribute to this pain.  He remains on gabapentin  600mg  TID for tingling and mild pain which has not worsened. The neurologist did also over the interim start him on Vit B12 supplementation due to deficiency.  He also has complications of diabetic CAD. There is no diabetic retinopathy or nephropathy.   His last dilated eye exam was done on 04/18/2023 and this showed no evidence of diabetic retinopathy.    Also over the interim, he did get admitted overnight for ablation for  atrial fibrillation on 03/20/2024 and tolerated the procedure well with no difficulties with his access sites.  He remains on apixaban for stroke risk reduction and no anti-arrhythmic drug.  His follow-up with electrophysiology in 04/2024.  He still feels some palpitations at times.  Again, he was hospitalized at [facility] from 12/05/2023 until 12/12/2023 due to SOB.  The patient was seen at his cardiologist office on 12/05/2023 where they were performing ECHO due to SOB.  This ECHO on 12/05/2023 showed a normal LV systolic function with LVEF of >55% with mild LVH.  His diastolic function could not be determined.  He had normal RV systolic function.  There was a large pericardial effusion noted and had a known chronic pericardial effusion that was also large on his ECHO from 07/22/2014.  There was concern for tamponade as he was having SOB with nausea and vomiting in the cardiology office.  He was sent to [facility] ER where he has a known history of CAD s/p PCI to distal RCA with PTCA, type 2 diabetes, ischemic cardiomyopathy, OSA, A-fib s/p ablation (2021) on Eliquis. The patient was admitted to the CICU for pericardial effusion with plans to go to cath lab the next morning for a pericardiocentesis with drain placement.  Again, he had a longstanding pericardial effusion at least as far back as 2015, but this  had been stable.  He underwent pericardiocentesis with drain placement on 6/19.  Fluid analysis showed exudative per lights criteria. His drain was removed 6/21. He had an elevated ESR/CRP. Weakly positive ANA (1:40), anti CCP, RF, ENA negative.  They did a repeat ECHO on 12/08/2023 and this showed a small pericardial effusion.  His pericardial cultures with bacterial was no growth and his fungal/mycobacterial cultures are still pending.  He had a cardiac MRI done that showed a loculated pericardial effusion in the inferior RV wall (max extent 17mm) w/ fibrinous strands; pericardial enhancement suggestive of inflammation; no  pericardial thickening, some increased septal shift c/w ventricular interdependence. They felt he had recurrent pericarditis where he was sent hom on a prednisone  taper as well as colchicine  x 5 month treatment for recurrent pericarditis.  They want to do serial TTE as an outpatient for monitoring of pericardial effusion reaccumulation.  They placed him on magnesium  as well in the hospital but he was not sent home on this.  He did require a short period of oxygen but was sent home on RA. He did have some complication of A.Fib with RVR where he was given an amiodarone  bolus.  He was placed on diltiazem  and sent home on diltiazem  ER 12hr 60mg  BID and they stopped his niacin  and his actos .  He was started on jardiance 25mg  daily and they placed him on humalog SSI as he had hyperglycemia and his HgBA1c was 9.1%/  He had some iron  deficiency anemia where they gave him IV iron .  Today, he states his chronic nausea/vomiting is resolved.  His SOB is better but not completely resolved.  There is no chest pain, edema.  He states he can still feel his A. Fib at times.  He did go back and see cardiology in followup on 12/31/2023 where he was continuing with A. Fib with RVR.  Recent Holter with increased Afib burden in addition to NSVT and SVT.   They want to consider updated ischemic evaluation and re-referral to EP service for consideration of antiarrhythmic therapy and/or additional ablation. Want to avoid tachycardia mediated heart failure/CM. Will also discuss referral to Rheumatology given effusion recurrence.  He did see pulmonary in followup on 12/04/2023 where again he has had this continued SOB.  He noted that the patient has increasing symptoms of shortness of breath.  His wife notes that he has been having desaturations rather nasally.  They did a 6-minute walk and it did not show significant desaturation but he could still be having nocturnal desaturations.  In addition he does have an echocardiogram that is already  scheduled.  They have ordered an overnight pulse oximetry and they are bringing him back next month for formal PFT's.  They also ordered a CT chest which was done on 12/27/2023 and this showed small right pleural effusion and associated atelectasis or consolidation.  Minimal paraseptal emphysema.   Diffuse bilateral bronchial wall thickening, consistent with infectious or inflammatory bronchitis.  No evidence of fibrotic interstitial lung disease.  Coarse contour of the liver, suggestive of cirrhosis.  Coronary artery disease with aortic atherosclerosis and Emphysema.  He has an appointment coming up to discuss this with pulmonary.       Past Medical History Past Medical History:  Diagnosis Date   Anemia 12/2020   Aortic atherosclerosis    Atrial fibrillation (HCC)    a. s/p PVI X2 by Dr Britta at [facility] around 2000.   CAD (coronary artery disease)    a. NSTEMI (06/2013):  LHC (06/27/2013):  pLAD 10-20, RCA 90, EF 45-50%, inf HK.  PCI:  Xience Alpine (3 x 23 mm) DES to the RCA. b. Relook cath 07/10/13 - patent stent. Imdur  added. c. Recurrent CP after that - abnormal nuc 07/2017 with chest pain, dyspnea, hypotensive response. Rest images with mid-basal inferior thinning. No stress images due to abrupt d/c of test. Relook cath 3/12 - no significant r   Chronic back pain greater than 3 months duration    Cirrhosis, non-alcoholic (HCC)    Depression    DJD (degenerative joint disease) of cervical spine    3 herniated discs (09/01/2013)   DJD (degenerative joint disease), lumbosacral    bulging L5-S1 (09/01/2013)   Dysrhythmia    PVCs. A.fib with two ablations   Herniated cervical disc    c2-T1 fusion   History of kidney stones    turned into urosepsis   HLD (hyperlipidemia)    Ischemic cardiomyopathy    a. Echo (06/28/2011): Inferior and inferoseptal akinesis, EF 40%, grade 2 diastolic dysfunction, MAC, small effusion (no tamponade). B. EF 40% by echo 06/2013, cath 07/2013, 55% by cath 08/2013 with  subtle mild mid inferior hypocontractility.   Liver cirrhosis secondary to NASH [facility])    Lumbar herniated disc    Neuromuscular disorder (HCC)    bilateral neuropathy legs   NSTEMI (non-ST elevated myocardial infarction) (HCC) 06/27/2013   Osteoarthritis    neck and lower back (09/01/2013)   Pericardial effusion    a. Chronic since hx of pericarditis in 2004. b. Mod by echo 06/2013, small by echo 06/2013.   Restless leg syndrome    Type II diabetes mellitus (HCC)      Allergies Allergies  Allergen Reactions   Codeine Nausea And Vomiting   Duragesic -100 [Fentanyl ] Nausea And Vomiting   Hydrocodone-Acetaminophen  Nausea And Vomiting   Naprosyn [Naproxen] Nausea Only   Other Nausea And Vomiting    1. N/V to all opiates. He is able to take versed . (noted 09/29/13) 2.The patient states that he has had  a reaction to all narcotics. He can take them though. (noted 06/26/13)     Medications  Current Outpatient Medications:    atorvastatin  (LIPITOR ) 80 MG tablet, TAKE 1 TABLET BY MOUTH EVERY DAY, Disp: 90 tablet, Rfl: 1   baclofen  (LIORESAL ) 10 MG tablet, TAKE 1 TABLET BY MOUTH THREE TIMES A DAY AS NEEDED FOR MUSCLE SPASM, Disp: 270 tablet, Rfl: 1   busPIRone  (BUSPAR ) 5 MG tablet, Take 1 tablet (5 mg total) by mouth 2 (two) times daily., Disp: 60 tablet, Rfl: 2   colchicine  0.6 MG tablet, Take 0.6 mg by mouth daily. (Patient taking differently: Take 0.6 mg by mouth in the morning and at bedtime.), Disp: , Rfl:    diltiazem  (CARDIZEM  SR) 120 MG 12 hr capsule, Take 120 mg by mouth every 36 (thirty-six) hours. (Patient taking differently: Take 60 mg by mouth 2 (two) times daily.), Disp: , Rfl:    ELIQUIS 5 MG TABS tablet, Take 5 mg by mouth 2 (two) times daily., Disp: , Rfl:    empagliflozin (JARDIANCE) 25 MG TABS tablet, Take 25 mg by mouth daily., Disp: , Rfl:    escitalopram  (LEXAPRO ) 20 MG tablet, TAKE 1 TABLET BY MOUTH EVERY DAY, Disp: 90 tablet, Rfl: 1   gabapentin  (NEURONTIN ) 600 MG  tablet, TAKE 1 TABLET BY MOUTH THREE TIMES A DAY, Disp: 180 tablet, Rfl: 5   insulin  glargine (LANTUS ) 100 unit/mL SOPN, Inject 8 Units into the skin daily.,  Disp: 3 mL, Rfl: 2   MAGNESIUM  GLYCINATE PO, Take 400 mg by mouth daily., Disp: , Rfl:    metFORMIN  (GLUCOPHAGE -XR) 500 MG 24 hr tablet, Take 1,000 mg by mouth 2 (two) times daily., Disp: , Rfl:    Misc Natural Products (PUMPKIN SEED OIL) CAPS, Take 1 capsule by mouth daily. 3000 mg, Disp: , Rfl:    pantoprazole  (PROTONIX ) 40 MG tablet, Take 1 tablet (40 mg total) by mouth daily., Disp: 90 tablet, Rfl: 1   promethazine  (PHENERGAN ) 25 MG tablet, Take 1 tablet (25 mg total) by mouth every 6 (six) hours as needed., Disp: 30 tablet, Rfl: 5   rOPINIRole  (REQUIP ) 1 MG tablet, Take 2 tablets (2 mg total) by mouth at bedtime., Disp: 90 tablet, Rfl: 3   vitamin B-12 (CYANOCOBALAMIN ) 50 MCG tablet, Take 50 mcg by mouth daily., Disp: , Rfl:    Review of Systems Review of Systems  Constitutional:  Negative for chills and fever.  Eyes:  Negative for blurred vision and double vision.  Respiratory:  Negative for shortness of breath.   Cardiovascular:  Positive for palpitations. Negative for chest pain and leg swelling.  Gastrointestinal:  Negative for abdominal pain, constipation, diarrhea, nausea and vomiting.  Genitourinary:  Negative for frequency.  Musculoskeletal:  Negative for myalgias.  Skin:  Negative for itching and rash.  Neurological:  Negative for dizziness, weakness and headaches.  Endo/Heme/Allergies:  Negative for polydipsia.       Objective:    Vitals BP 120/78   Pulse 98   Temp (!) 97 F (36.1 C) (Temporal)   Resp 16   Ht 5' 7 (1.702 m)   Wt 174 lb 3.2 oz (79 kg)   SpO2 96%   BMI 27.28 kg/m    Physical Examination Physical Exam Constitutional:      Appearance: Normal appearance. He is not ill-appearing.  Cardiovascular:     Rate and Rhythm: Normal rate and regular rhythm.     Pulses: Normal pulses.     Heart  sounds: No murmur heard.    No friction rub. No gallop.  Pulmonary:     Effort: Pulmonary effort is normal. No respiratory distress.     Breath sounds: No wheezing, rhonchi or rales.  Abdominal:     General: Abdomen is flat. Bowel sounds are normal. There is no distension.     Palpations: Abdomen is soft.     Tenderness: There is no abdominal tenderness.  Musculoskeletal:     Right lower leg: No edema.     Left lower leg: No edema.  Skin:    General: Skin is warm and dry.     Findings: No rash.  Neurological:     Mental Status: He is alert.        Assessment & Plan:   Paroxysmal atrial fibrillation (HCC) He is in sinus rhythm today on my exam.  He is s/p ablaation and we will continue with rate control on cardizem  SR.  He takes magnesium  supplements at this time and we will recheck his electrolytes.  He will remain on anticoagulation at this time.  Type 2 diabetes mellitus with other circulatory complications (HCC) His blood sugars have come back down to normal since coming off the prednisone .  We will check his HgBa1c today.    Return in about 3 months (around 07/05/2024).   Selinda Fleeta Finger, MD

## 2024-04-17 DIAGNOSIS — E1142 Type 2 diabetes mellitus with diabetic polyneuropathy: Secondary | ICD-10-CM | POA: Diagnosis not present

## 2024-04-17 DIAGNOSIS — Z5189 Encounter for other specified aftercare: Secondary | ICD-10-CM | POA: Diagnosis not present

## 2024-04-17 DIAGNOSIS — E119 Type 2 diabetes mellitus without complications: Secondary | ICD-10-CM | POA: Diagnosis not present

## 2024-05-15 ENCOUNTER — Other Ambulatory Visit: Payer: Self-pay | Admitting: Neurology

## 2024-05-15 DIAGNOSIS — G2581 Restless legs syndrome: Secondary | ICD-10-CM

## 2024-05-20 DIAGNOSIS — I2510 Atherosclerotic heart disease of native coronary artery without angina pectoris: Secondary | ICD-10-CM | POA: Diagnosis not present

## 2024-05-20 DIAGNOSIS — I3139 Other pericardial effusion (noninflammatory): Secondary | ICD-10-CM | POA: Diagnosis not present

## 2024-05-20 DIAGNOSIS — I319 Disease of pericardium, unspecified: Secondary | ICD-10-CM | POA: Diagnosis not present

## 2024-05-20 DIAGNOSIS — I480 Paroxysmal atrial fibrillation: Secondary | ICD-10-CM | POA: Diagnosis not present

## 2024-05-26 NOTE — Progress Notes (Signed)
 NEUROLOGY FOLLOW UP OFFICE NOTE  Kristopher [name] [id number]  Subjective:  Kristopher [name] is a 73 y.o. year old right-handed male with a medical history of DM, CAD c/b cardiomyopathy, HLD, afib, degenerative disc disease (cervical and lumbar spine) s/p L4-5 surgery, NASH cirrhosis, chronic gastritis, iron  deficiency, OSA, OA, depression who we last saw on 11/30/23 for RLS, PLMD, neuropathy, and leg cramps.  To briefly review: 11/30/23: Patients symptoms have been present for 5-10 years. He first noticed tingling, burning, and numbness in his legs from knees down. He takes gabapentin  600 mg in the morning and 1200 mg at night (9 pm) that seems to help. He typically goes to bed at 1-2 am. He is okay with current pain control.    He has imbalance and occasional falls. He has only had 1 fall in 2025, usually with feet getting tangled up.   When he lays down at night, he feels like bugs are crawling on legs and that they are restless. He is not sure that symptoms gets better when he gets up to move. Per wife though, he was walking 3 miles at night that would help symptoms (10 pm at night). He can no longer do this because of shortness of breath. This is being worked by up cardiology and may be related to afib per patient.   He has a lot of cramps in his legs that is all day and night. Standing up helps. He takes baclofen  10 mg twice daily. He is not sure if this helps.   While he is sleeping, he will flail around. Prior to CPAP, it would occur every 10-15 seconds. Patient does not know it is happening. He is not talking, screaming, punching, or fighting (no REM sleep disorder symptoms). His sleep is okay. He is still snoring. He does not feel rested and is tired throughout the day.   Patient was put on requip  about 5 years ago. He is currently on 3 mg at night (9-10 pm). Wife thinks it helps with movements at night. Patient does not feel it helps with bug crawling, restless leg sensation.   Do RLS  symptoms appear earlier than when the drug was first started? Yes Are higher doses of the drug now needed, or do you need to take the drug earlier in the day to control symptoms? Started at 1 mg and up titrated to 3 mg. Is not taking earlier Has the intensity of symptoms worsened since starting the drug? No Have symptoms spread to other body parts (eg, arms) since starting the drug? No   He was put on CPAP earlier this year. Sleep study saw a lot of limb movements in sleep. Wife thinks his movements may have improved further. Patient cannot tell any difference, but per wife, she thinks it was helping until he started having the afib and being short of breath.   Patient is also having a lot nausea and vomiting, some with meals. Per patient and his wife, gastroparesis has been ruled out. He has been seen by GI who cannot find anything per patient.   He is not on iron  supplementation. He is not on any other supplementation such as B12.   He does not report any constitutional symptoms like fever, night sweats, anorexia or unintentional weight loss.   EtOH use: No  Restrictive diet? No Family history of neurologic disease? Paternal grandmother had ALS   Patient had an EMG many years ago, that showed neuropathy per patient.  Most recent  Assessment and Plan (11/30/23): Kristopher [name] is a 73 y.o. male who presents for evaluation of restless legs, periodic limb movement disorder, neuropathy, and leg cramps. 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 His neurological examination is pertinent for mild sensation loss in feet and lateral aspects of lower leg. Available diagnostic data is significant for sleep study in 2024 showing OSA and PLMD. B12 over 1 year ago was borderline low. Most recent HbA1c was 7.1. Most recent ferritin  earlier this year was 171. Patient's symptoms are likely multifactorial. The loss of sensation in his leg may be a combination of residuals from spine disease and a distal symmetric polyneuropathy. He also has symptoms consistent with restless leg syndrome. Neuropathy is associated with RLS and could be contributing. He also has symptoms of periodic limb movement disorder of sleep. OSA can also be associated with both RLS and PLMD. Patient has been on requip  for a long time, which is no longer recommended for RLS and may cause worsening (augmentation). I may try to slowly reduce this to see if this helps or at least prevents worsening. For PLMD, benzo can be considered, but this would not be wise currently with SOB symptoms as I would not want to do anything that suppresses respiration.   PLAN: -Blood work: B1, B12, IFE -Reduce requip  to 2 mg nightly -Continue gabapentin  600 mg/1200 mg -OTC cramp recommendations given -Continue to work with cardiology (shortness of breath) and pulmonology (OSA)  Since their last visit: Labs showed a borderline B12. I recommended supplementing with B12 1000 mcg daily.  Patient has been off requip  now for about 3 weeks. He has leg movements at night that is no worse than previous. He sometimes wakes with cramps.  He has success with mustard for cramps. He is still taking gabapentin  600 mg/1200 mg.   MEDICATIONS:  Outpatient Encounter Medications as of 06/04/2024  Medication Sig   atorvastatin  (LIPITOR ) 80 MG tablet TAKE 1 TABLET BY MOUTH EVERY DAY   baclofen  (LIORESAL ) 10 MG tablet TAKE 1 TABLET BY MOUTH THREE TIMES A DAY AS NEEDED FOR MUSCLE SPASM   busPIRone  (BUSPAR ) 5 MG tablet TAKE 1 TABLET BY MOUTH TWICE A DAY   colchicine  0.6 MG tablet Take 0.6 mg by mouth daily. (Patient taking differently: Take 0.6 mg by mouth in the morning and at bedtime.)   diltiazem  (CARDIZEM  SR) 120 MG 12 hr capsule Take 120 mg by mouth every 36 (thirty-six) hours. (Patient taking  differently: Take 60 mg by mouth 2 (two) times daily.)   ELIQUIS 5 MG TABS tablet Take 5 mg by mouth 2 (two) times daily.   empagliflozin (JARDIANCE) 25 MG TABS tablet Take 25 mg by mouth daily.   escitalopram  (LEXAPRO ) 20 MG tablet Take 1 tablet (20 mg total) by mouth daily.   gabapentin  (NEURONTIN ) 600 MG tablet TAKE 1 TABLET BY MOUTH THREE TIMES A DAY   insulin  glargine (LANTUS ) 100 unit/mL SOPN Inject 8 Units into the skin daily.   MAGNESIUM  GLYCINATE PO Take 400 mg by mouth daily.   metFORMIN  (GLUCOPHAGE -XR) 500 MG 24 hr tablet Take 1,000 mg by mouth 2 (two) times daily.   Misc Natural Products (PUMPKIN SEED OIL) CAPS Take 1 capsule by mouth daily. 3000 mg   pantoprazole  (PROTONIX ) 40 MG tablet Take 1 tablet (40 mg total) by mouth daily.  promethazine  (PHENERGAN ) 25 MG tablet Take 1 tablet (25 mg total) by mouth every 6 (six) hours as needed.   rOPINIRole  (REQUIP ) 1 MG tablet TAKE 2 TABLETS (2 MG TOTAL) BY MOUTH AT BEDTIME   vitamin B-12 (CYANOCOBALAMIN ) 50 MCG tablet Take 50 mcg by mouth daily.   [DISCONTINUED] busPIRone  (BUSPAR ) 5 MG tablet Take 1 tablet (5 mg total) by mouth 2 (two) times daily.   No facility-administered encounter medications on file as of 06/04/2024.    PAST MEDICAL HISTORY: Past Medical History:  Diagnosis Date   Anemia 12/2020   Aortic atherosclerosis    Atrial fibrillation (HCC)    a. s/p PVI X2 by Dr Britta at [facility] around 2000.   CAD (coronary artery disease)    a. NSTEMI (06/2013):  LHC (06/27/2013):  pLAD 10-20, RCA 90, EF 45-50%, inf HK.  PCI:  Xience Alpine (3 x 23 mm) DES to the RCA. b. Relook cath 07/10/13 - patent stent. Imdur  added. c. Recurrent CP after that - abnormal nuc 07/2017 with chest pain, dyspnea, hypotensive response. Rest images with mid-basal inferior thinning. No stress images due to abrupt d/c of test. Relook cath 3/12 - no significant r   Chronic back pain greater than 3 months duration    Cirrhosis, non-alcoholic (HCC)    Depression     DJD (degenerative joint disease) of cervical spine    3 herniated discs (09/01/2013)   DJD (degenerative joint disease), lumbosacral    bulging L5-S1 (09/01/2013)   Dysrhythmia    PVCs. A.fib with two ablations   Herniated cervical disc    c2-T1 fusion   History of kidney stones    turned into urosepsis   HLD (hyperlipidemia)    Ischemic cardiomyopathy    a. Echo (06/28/2011): Inferior and inferoseptal akinesis, EF 40%, grade 2 diastolic dysfunction, MAC, small effusion (no tamponade). B. EF 40% by echo 06/2013, cath 07/2013, 55% by cath 08/2013 with subtle mild mid inferior hypocontractility.   Liver cirrhosis secondary to NASH [facility])    Lumbar herniated disc    Neuromuscular disorder (HCC)    bilateral neuropathy legs   NSTEMI (non-ST elevated myocardial infarction) (HCC) 06/27/2013   Osteoarthritis    neck and lower back (09/01/2013)   Pericardial effusion    a. Chronic since hx of pericarditis in 2004. b. Mod by echo 06/2013, small by echo 06/2013.   Restless leg syndrome    Type II diabetes mellitus (HCC)     PAST SURGICAL HISTORY: Past Surgical History:  Procedure Laterality Date   APPENDECTOMY  ~ 1965   ATRIAL FIBRILLATION ABLATION  06/19/1998   Dr Britta at [facility]   BIOPSY  06/15/2022   Procedure: BIOPSY;  Surgeon: Stacia Glendia BRAVO, MD;  Location: [facility];  Service: Gastroenterology;;   CARDIAC CATHETERIZATION     COLONOSCOPY WITH PROPOFOL  N/A 06/15/2022   Procedure: COLONOSCOPY WITH PROPOFOL ;  Surgeon: Stacia Glendia BRAVO, MD;  Location: [facility];  Service: Gastroenterology;  Laterality: N/A;   CORONARY ANGIOPLASTY     CYSTOSCOPY WITH RETROGRADE URETHROGRAM N/A 07/21/2021   Procedure: CYSTOSCOPY WITH URETHRAL DILATION;  Surgeon: Cam Morene ORN, MD;  Location: [facility];  Service: Urology;  Laterality: N/A;  45 MINS   ESOPHAGOGASTRODUODENOSCOPY (EGD) WITH PROPOFOL  N/A 06/15/2022   Procedure: ESOPHAGOGASTRODUODENOSCOPY (EGD) WITH PROPOFOL ;  Surgeon:  Stacia Glendia BRAVO, MD;  Location: [facility];  Service: Gastroenterology;  Laterality: N/A;   LEFT AND RIGHT HEART CATHETERIZATION WITH CORONARY ANGIOGRAM N/A 10/07/2013   Procedure: LEFT AND RIGHT HEART  CATHETERIZATION WITH CORONARY ANGIOGRAM;  Surgeon: Debby DELENA Sor, MD;  Location: [facility] CATH LAB;  Service: Cardiovascular;  Laterality: N/A;   LEFT HEART CATHETERIZATION WITH CORONARY ANGIOGRAM N/A 06/27/2013   Procedure: LEFT HEART CATHETERIZATION WITH CORONARY ANGIOGRAM;  Surgeon: Debby DELENA Sor, MD;  Location: [facility] CATH LAB;  Service: Cardiovascular;  Laterality: N/A;   LEFT HEART CATHETERIZATION WITH CORONARY ANGIOGRAM N/A 07/10/2013   Procedure: LEFT HEART CATHETERIZATION WITH CORONARY ANGIOGRAM;  Surgeon: Lonni JONETTA Cash, MD;  Location: [facility] CATH LAB;  Service: Cardiovascular;  Laterality: N/A;   LEFT HEART CATHETERIZATION WITH CORONARY ANGIOGRAM N/A 08/28/2013   Procedure: LEFT HEART CATHETERIZATION WITH CORONARY ANGIOGRAM;  Surgeon: Debby DELENA Sor, MD;  Location: [facility] CATH LAB;  Service: Cardiovascular;  Laterality: N/A;   NEPHROLITHOTOMY Left 02/11/2021   Procedure: LEFT NEPHROLITHOTOMY PERCUTANEOUS WITH SURGEON ACCESS;  Surgeon: Cam Morene ORN, MD;  Location: [facility];  Service: Urology;  Laterality: Left;   PERCUTANEOUS CORONARY STENT INTERVENTION (PCI-S)  06/27/2013   Procedure: PERCUTANEOUS CORONARY STENT INTERVENTION (PCI-S);  Surgeon: Debby DELENA Sor, MD;  Location: [facility] CATH LAB;  Service: Cardiovascular;;   POLYPECTOMY  06/15/2022   Procedure: POLYPECTOMY;  Surgeon: Stacia Glendia BRAVO, MD;  Location: [facility];  Service: Gastroenterology;;   POSTERIOR CERVICAL FUSION/FORAMINOTOMY  06/20/2007   fusion c3-t1   PULMONARY VEIN ABLATION  06/20/1999   Dr Britta at [facility]   TONSILLECTOMY  06/19/1957   TRANSFORAMINAL LUMBAR INTERBODY FUSION W/ MIS 1 LEVEL Left 12/08/2021   Procedure: LEFT LUMBAR FIVE-SACRAL ONE MINIMALLY INVASIVE (MIS) TRANSFORAMINAL LUMBAR INTERBODY FUSION;   Surgeon: Cheryle Debby DELENA, MD;  Location: [facility];  Service: Neurosurgery;  Laterality: Left;    ALLERGIES: Allergies  Allergen Reactions   Codeine Nausea And Vomiting   Duragesic -100 [Fentanyl ] Nausea And Vomiting   Hydrocodone-Acetaminophen  Nausea And Vomiting   Naprosyn [Naproxen] Nausea Only   Other Nausea And Vomiting    1. N/V to all opiates. He is able to take versed . (noted 09/29/13) 2.The patient states that he has had  a reaction to all narcotics. He can take them though. (noted 06/26/13)    FAMILY HISTORY: Family History  Problem Relation Age of Onset   Ovarian cancer Mother    Arthritis Mother    CAD Father    Liver disease Father    Diabetes Father    Arthritis Father    COPD Father    Arthritis Sister    Dementia Sister    Stroke Maternal Grandmother    Sudden death Maternal Grandfather    Heart attack Maternal Grandfather    ALS Paternal Grandmother    Cancer Paternal Grandfather        unknown   Arrhythmia Neg Hx     SOCIAL HISTORY: Social History   Tobacco Use   Smoking status: Former    Current packs/day: 0.00    Average packs/day: 1.5 packs/day for 43.0 years (64.5 ttl pk-yrs)    Types: Cigarettes    Start date: 05/05/1964    Quit date: 05/06/2007    Years since quitting: 17.0   Smokeless tobacco: Never  Vaping Use   Vaping status: Never Used  Substance Use Topics   Alcohol use: Not Currently    Comment: once monthly/rarely   Drug use: No   Social History   Social History Narrative   Are you right handed or left handed? right   Are you currently employed ?    What is your current occupation? retired   Do you live at home  alone?   Who lives with you? wife   What type of home do you live in: 1 story or 2 story? one    Caffiene all day long 6 soda      Objective:  Vital Signs:  BP 100/60   Pulse 86   Ht 5' 7 (1.702 m)   Wt 172 lb (78 kg)   SpO2 95%   BMI 26.94 kg/m   General: No acute distress.  Patient appears well-groomed.    Head:  Normocephalic/atraumatic Lungs: Non-labored breathing on room air   Neurological Exam: Mental status: alert and oriented, speech fluent and not dysarthric, language intact.  Cranial nerves: CN I: not tested CN II: pupils equal, round and reactive to light, visual fields intact CN III, IV, VI:  full range of motion, no nystagmus, no ptosis CN V: facial sensation intact. CN VII: upper and lower face symmetric CN VIII: hearing intact CN IX, X: uvula midline CN XI: sternocleidomastoid and trapezius muscles intact CN XII: tongue midline  Bulk & Tone: normal, no fasciculations. Motor:  muscle strength 5/5 throughout Deep Tendon Reflexes:  2+ throughout,  toes downgoing.   Sensation:  Pinprick, vibratory, and proprioceptive sensation intact. Finger to nose testing:  Without dysmetria.   Gait:  Normal station and stride.  Romberg negative.   Labs and Imaging review: New results: External labs 05/20/24: CRP elevated to 1.08 ESR wnl CMP significant for glucose 207 CBC significant for Hb 12.9, MCV 96  01/02/24: CK 99 TSH wnl HbA1c: 9.9 Lipid panel: tChol 125, LDL 45, TG 112  11/30/23: IFE: no M protein B1 wnl B12: 263  Previously reviewed results: 10/03/23: Lipid panel: tChol 138, LDL 71, TG 149 HbA1c: 7.1 CMP significant for glucose 145   07/09/23: Ferritin: 171 TSH wnl   B12 (06/30/22): 273   Imaging/Procedures: MRI brain w/wo contrast (08/15/23): FINDINGS: Brain: No acute infarction, hemorrhage, hydrocephalus, extra-axial collection or mass lesion. Small vessel ischemic gliosis in the cerebral white matter without progression from 2022, mild-to-moderate. Brain volume is preserved from prior scan and for patient age. No abnormal enhancement.   Vascular: Major flow voids and vascular enhancements are preserved   Skull and upper cervical spine: No marrow signal abnormality. Multilevel cervical fusion which is partially covered.   Sinuses/Orbits: No acute  finding   Other: Intermittently significant motion degradation.   IMPRESSION: 1. No acute or reversible finding. 2. Chronic small vessel disease that is stable from 2022. 3. Motion artifact.   MRI lumbar spine wo contrast (03/19/23): FINDINGS: Segmentation: 5 lumbar type vertebral bodies as numbered previously on the CT. Prior MRI described S1 as a transitional segment. This exam describes L5 as a transitional segment.   Alignment:  Normal   Vertebrae:  Previous PLIF L4-5.   Conus medullaris and cauda equina: Conus extends to the L1 level. Conus and cauda equina appear normal.   Paraspinal and other soft tissues: Negative   Disc levels:   No significant finding from T11-12 through L2-3.   L3-4: Mild bulging of the disc. Bilateral facet and ligamentous hypertrophy. Mild narrowing of the lateral recesses and foramina but no visible neural compression.   L4-5: Previous posterior decompression, diskectomy and fusion. Pedicle screws and posterior rods. No complicating feature visible by MRI. Apparent sufficient patency of the canal and foramina.   L5-S1: Transitional features. No significant disc or facet abnormality.   IMPRESSION: 1. Previous posterior decompression, diskectomy and fusion at L4-5. Sufficient patency of the canal and foramina. 2. L3-4: Mild  bulging of the disc. Facet and ligamentous hypertrophy. Mild narrowing of the lateral recesses and foramina but no visible neural compression.   MRI cervical spine wo contrast (01/24/2008): IMPRESSION:  Congenitally small spinal canal upon which is superimposed  extensive degenerative disease.  The subarachnoid space is  essentially effaced in the region from C3-T1.  Cord deformity is  most notable at C5-6 there is also demonstrated at C3-4 and C6-7.  There is neural foraminal encroachment throughout the region as  outlined above. There is either migrated disc material or posterior  longitudinal ligament calcification  behind the C7 vertebral body.    Sleep study (03/08/23):   Assessment/Plan:  This is Kristopher [name], a 73 y.o. male with: Periodic limb movement disorder - has weaned off ropinirole  without significant change in symptoms. Leg cramps - could have contributions from lumbar spine disease and ?mild peripheral neuropathy. Symptoms are helped with mustard   Plan: -Continue gabapentin  600 mg/1200 mg -OTC recommendations given for cramps  Return to clinic in 1 year  Venetia Potters, MD

## 2024-05-29 ENCOUNTER — Other Ambulatory Visit: Payer: Self-pay | Admitting: Internal Medicine

## 2024-06-04 ENCOUNTER — Ambulatory Visit: Admitting: Neurology

## 2024-06-04 ENCOUNTER — Encounter: Payer: Self-pay | Admitting: Neurology

## 2024-06-04 VITALS — BP 100/60 | HR 86 | Ht 67.0 in | Wt 172.0 lb

## 2024-06-04 DIAGNOSIS — E1142 Type 2 diabetes mellitus with diabetic polyneuropathy: Secondary | ICD-10-CM

## 2024-06-04 DIAGNOSIS — G4761 Periodic limb movement disorder: Secondary | ICD-10-CM | POA: Diagnosis not present

## 2024-06-04 DIAGNOSIS — G2581 Restless legs syndrome: Secondary | ICD-10-CM

## 2024-06-04 DIAGNOSIS — R252 Cramp and spasm: Secondary | ICD-10-CM

## 2024-06-04 NOTE — Patient Instructions (Addendum)
 Continue gabapentin  600 mg/1200 mg.  Your B12 was borderline low. Given your symptoms, I would recommend supplementing with B12 1000 mcg daily. This can be bought over the counter at any local drug store or online.  Recommend the following measures that may provide some symptomatic benefit for muscle twitching and/or cramps: - Adequate oral clear fluid intake to maintain optimal hydration (about 2.5 liters, or around 8-10 glasses per day) Avoidance of caffeine Trial of DIET tonic water: About 1 glass, up to 6 times daily Magnesium  oxide up to 400 mg by mouth twice daily, as needed (over the counter) Try a spoon of mustard Gentle muscle stretching routine, especially before bedtime   Please let me know if you have any questions or concerns in the meantime.  The physicians and staff at [facility] are committed to providing excellent care. You may receive a survey requesting feedback about your experience at our office. We strive to receive very good responses to the survey questions. If you feel that your experience would prevent you from giving the office a very good  response, please contact our office to try to remedy the situation. We may be reached at [phone]. Thank you for taking the time out of your busy day to complete the survey.  Venetia Potters, MD [facility]

## 2024-07-03 ENCOUNTER — Telehealth: Payer: Self-pay

## 2024-07-03 NOTE — Telephone Encounter (Signed)
 Copied from CRM [id number]. Topic: Appointments - Scheduling Inquiry for Clinic >> Jul 02, 2024  3:54 PM Mesmerise C wrote: Reason for CRM: Patient wants to become established as a new patient with Glade Hope, patient's wife Kristopher [name] 04/15/1966 is a patient of hers if Dr. Hope can take him on as a new pt please call [phone] to schedule an appt    ----------------------------------------------------------------------- From previous Reason for Contact - Other: Reason for CRM:

## 2024-07-03 NOTE — Telephone Encounter (Signed)
 I will accept - please do not schedule until feb or later

## 2024-07-04 ENCOUNTER — Ambulatory Visit: Admitting: Internal Medicine

## 2024-07-08 ENCOUNTER — Ambulatory Visit: Payer: Medicare HMO | Admitting: Internal Medicine

## 2024-07-09 ENCOUNTER — Other Ambulatory Visit: Payer: Self-pay

## 2024-07-09 DIAGNOSIS — G2581 Restless legs syndrome: Secondary | ICD-10-CM

## 2024-07-09 MED ORDER — ROPINIROLE HCL 1 MG PO TABS: 2.0000 mg | ORAL_TABLET | Freq: Every day | ORAL | 0 refills | Status: AC

## 2024-07-09 MED ORDER — ATORVASTATIN CALCIUM 80 MG PO TABS: 80.0000 mg | ORAL_TABLET | Freq: Every day | ORAL | 0 refills | Status: DC

## 2024-07-14 ENCOUNTER — Ambulatory Visit

## 2024-07-16 ENCOUNTER — Ambulatory Visit

## 2024-07-18 ENCOUNTER — Ambulatory Visit

## 2024-07-18 VITALS — BP 100/64 | HR 71 | Temp 98.0°F | Resp 16 | Ht 67.0 in | Wt 169.2 lb

## 2024-07-18 DIAGNOSIS — I480 Paroxysmal atrial fibrillation: Secondary | ICD-10-CM

## 2024-07-18 DIAGNOSIS — E1159 Type 2 diabetes mellitus with other circulatory complications: Secondary | ICD-10-CM

## 2024-07-18 DIAGNOSIS — E7800 Pure hypercholesterolemia, unspecified: Secondary | ICD-10-CM

## 2024-07-18 MED ORDER — BACLOFEN 10 MG PO TABS: 10.0000 mg | ORAL_TABLET | Freq: Three times a day (TID) | ORAL | 1 refills | Status: AC

## 2024-07-18 MED ORDER — ATORVASTATIN CALCIUM 80 MG PO TABS: 80.0000 mg | ORAL_TABLET | Freq: Every day | ORAL | 0 refills | Status: AC

## 2024-07-18 MED ORDER — GABAPENTIN 600 MG PO TABS: 600.0000 mg | ORAL_TABLET | Freq: Three times a day (TID) | ORAL | 5 refills | Status: AC

## 2024-07-18 MED ORDER — PROMETHAZINE HCL 25 MG PO TABS: 25.0000 mg | ORAL_TABLET | Freq: Four times a day (QID) | ORAL | 5 refills | Status: AC | PRN

## 2024-07-22 NOTE — Progress Notes (Signed)
 Patient Name: Markail Diekman DOB: [birth year] Age: 74 year old male Date of Visit: 07/18/2024 Reason for Visit: Follow-up and medication refills for hyperlipidemia, Type 2 diabetes mellitus, and atrial fibrillation  Subjective History of Present Illness: Mr. Surette is a 74 year old male who presents for routine chronic disease follow-up and medication refills. He is being followed for hypercholesteremia, Type 2 diabetes mellitus, and atrial fibrillation.  Regarding his hypercholesteremia, patient is currently taking atorvastatin . He was previously treated with rosuvastatin  (Crestor ) but was switched due to reported myalgias. He denies current muscle pain or weakness. He denies abdominal pain, nausea, or diarrhea. He reports mild fatigue.  Regarding Type 2 diabetes mellitus, patient is currently taking metformin  and insulin  glargine (Lantus ). He previously trialed Farxiga but discontinued medication due to cost. He reports compliance with current medication regimen. His last hemoglobin A1C obtained approximately three months ago was 6.9%. He denies symptoms of hyperglycemia or hypoglycemia. He denies polyuria, polydipsia, or polyphagia.  Regarding atrial fibrillation, patient remains on apixaban (Eliquis) and diltiazem  (Cardizem ) for rate control and anticoagulation. He denies palpitations, chest pain, dizziness, syncope, or shortness of breath.  Patient reports generalized malaise and fatigue but denies acute complaints.  Allergies Codeine Fentanyl  Hydrocodone Naproxen  Review of Systems Constitutional: Positive for fatigue and malaise. Denies fever, chills, or weight changes. Respiratory: Denies cough, shortness of breath, wheezing, or hemoptysis. Cardiovascular: Denies chest pain, palpitations, edema, or syncope. Gastrointestinal: Denies abdominal pain, nausea, vomiting, diarrhea, or constipation. Genitourinary: Denies dysuria, urgency, frequency, or hematuria. Endocrine: Denies  polyuria, polydipsia, or polyphagia. Musculoskeletal: Denies muscle pain, joint pain, or weakness. Neurological: Denies headaches, dizziness, numbness, tingling, or focal weakness. Psychiatric: Denies anxiety, depression, or mood changes.  Objective Vital Signs: Blood Pressure: 100/64 mmHg Pulse: 71 bpm Temperature: 41F (Temporal) Respirations: 16/min SpO?: 97% on room air Weight: 169.3 lbs Height: 5'7  Physical Examination Constitutional: Well-appearing elderly male. Alert and oriented x3. No acute distress. Cardiovascular: Irregularly irregular rhythm consistent with history of atrial fibrillation. Rate controlled. No murmurs, rubs, or gallops. Peripheral pulses intact bilaterally. No peripheral edema. Respiratory: Lungs clear to auscultation bilaterally. No wheezes, rales, or rhonchi. No respiratory distress. Abdomen: Soft, non-tender, non-distended. Bowel sounds present in all quadrants. No hepatosplenomegaly. Musculoskeletal: Normal range of motion. No joint swelling or tenderness. No muscle tenderness. Neurological: Alert and oriented. Normal gait.  Assessment Type 2 Diabetes Mellitus without complications  Controlled with last A1C 6.9% Hypercholesteremia Stable on atorvastatin , history of statin-associated myalgias with rosuvastatin  Chronic Atrial Fibrillation Stable on anticoagulation and rate control Long-term use of insulin  - Z79.4 Long-term use of anticoagulants - Z79.01  Patient is managed for multiple chronic conditions including Type 2 diabetes mellitus requiring insulin  therapy, chronic atrial fibrillation requiring anticoagulation and rate control medications, and hyperlipidemia requiring ongoing pharmacologic management with history of medication intolerance. Medication management and refill decisions were addressed. Review of prior laboratory data including hemoglobin A1C was completed, and continued monitoring with future laboratory evaluation was planned.  Chronic conditions place patient at moderate risk for complications and morbidity without ongoing treatment and monitoring.  Plan Type 2 Diabetes Mellitus: Continue metformin  as prescribed. Continue Lantus  as prescribed. Reinforce diabetic diet and lifestyle modifications. Encourage routine home glucose monitoring. Repeat hemoglobin A1C prior to next visit. Continue annual diabetic eye exams and foot care. Hypercholesteremia: Continue atorvastatin . Encourage low-fat, heart-healthy diet and regular physical activity. Monitor lipid panel with routine labs. Atrial Fibrillation: Continue Eliquis for anticoagulation. Continue Cardizem  for rate control. Educated patient on bleeding precautions and when to seek medical care. Continue  cardiology follow-up as indicated. General Health: Encourage balanced diet, exercise as tolerated, and medication compliance. Discussed importance of routine lab monitoring.  Follow-Up Follow up in 6 months for chronic disease management. Return sooner for new or worsening symptoms including chest pain, palpitations, uncontrolled blood sugars, bleeding concerns, or medication side effects.  Jon Bihari, NP

## 2024-07-22 NOTE — Assessment & Plan Note (Addendum)
 Controlled with last A1C 6.9% Continue metformin  as prescribed. Continue Lantus  as prescribed. Reinforce diabetic diet and lifestyle modifications. Encourage routine home glucose monitoring. Repeat hemoglobin A1C prior to next visit. Continue annual diabetic eye exams and foot care.

## 2024-07-22 NOTE — Assessment & Plan Note (Addendum)
 Stable on anticoagulation and rate control Continue Eliquis for anticoagulation. Continue Cardizem  for rate control. Educated patient on bleeding precautions and when to seek medical care. Continue cardiology follow-up as indicated.

## 2025-01-09 ENCOUNTER — Ambulatory Visit

## 2025-02-03 ENCOUNTER — Ambulatory Visit: Admitting: Internal Medicine

## 2025-06-04 ENCOUNTER — Ambulatory Visit: Payer: Self-pay | Admitting: Neurology
